# Patient Record
Sex: Male | Born: 1950 | Race: White | Hispanic: No | Marital: Married | Smoking: Current every day smoker
Health system: Southern US, Community
[De-identification: ages and names within clinical notes are randomized; demographics above are authoritative.]

## PROBLEM LIST (undated history)

## (undated) DIAGNOSIS — E785 Hyperlipidemia, unspecified: Secondary | ICD-10-CM

## (undated) DIAGNOSIS — I700 Atherosclerosis of aorta: Secondary | ICD-10-CM

## (undated) DIAGNOSIS — E559 Vitamin D deficiency, unspecified: Secondary | ICD-10-CM

## (undated) DIAGNOSIS — I4719 Other supraventricular tachycardia: Secondary | ICD-10-CM

## (undated) DIAGNOSIS — I2699 Other pulmonary embolism without acute cor pulmonale: Secondary | ICD-10-CM

## (undated) DIAGNOSIS — S2220XA Unspecified fracture of sternum, initial encounter for closed fracture: Secondary | ICD-10-CM

## (undated) DIAGNOSIS — R972 Elevated prostate specific antigen [PSA]: Secondary | ICD-10-CM

## (undated) DIAGNOSIS — I471 Supraventricular tachycardia: Secondary | ICD-10-CM

## (undated) DIAGNOSIS — R7982 Elevated C-reactive protein (CRP): Secondary | ICD-10-CM

## (undated) DIAGNOSIS — Z720 Tobacco use: Secondary | ICD-10-CM

## (undated) DIAGNOSIS — L409 Psoriasis, unspecified: Secondary | ICD-10-CM

## (undated) DIAGNOSIS — I2510 Atherosclerotic heart disease of native coronary artery without angina pectoris: Secondary | ICD-10-CM

## (undated) DIAGNOSIS — C9110 Chronic lymphocytic leukemia of B-cell type not having achieved remission: Secondary | ICD-10-CM

## (undated) DIAGNOSIS — B009 Herpesviral infection, unspecified: Secondary | ICD-10-CM

## (undated) DIAGNOSIS — T7840XA Allergy, unspecified, initial encounter: Secondary | ICD-10-CM

## (undated) DIAGNOSIS — Z8781 Personal history of (healed) traumatic fracture: Secondary | ICD-10-CM

## (undated) DIAGNOSIS — L719 Rosacea, unspecified: Secondary | ICD-10-CM

## (undated) DIAGNOSIS — R768 Other specified abnormal immunological findings in serum: Secondary | ICD-10-CM

## (undated) HISTORY — DX: Personal history of (healed) traumatic fracture: Z87.81

## (undated) HISTORY — DX: Unspecified fracture of sternum, initial encounter for closed fracture: S22.20XA

## (undated) HISTORY — DX: Elevated C-reactive protein (CRP): R79.82

## (undated) HISTORY — DX: Other specified abnormal immunological findings in serum: R76.8

## (undated) HISTORY — DX: Vitamin D deficiency, unspecified: E55.9

## (undated) HISTORY — DX: Tobacco use: Z72.0

## (undated) HISTORY — PX: FRACTURE SURGERY: SHX138

## (undated) HISTORY — DX: Psoriasis, unspecified: L40.9

## (undated) HISTORY — DX: Allergy, unspecified, initial encounter: T78.40XA

## (undated) HISTORY — DX: Hyperlipidemia, unspecified: E78.5

## (undated) HISTORY — DX: Elevated prostate specific antigen (PSA): R97.20

## (undated) HISTORY — DX: Atherosclerosis of aorta: I70.0

## (undated) HISTORY — DX: Rosacea, unspecified: L71.9

## (undated) HISTORY — DX: Herpesviral infection, unspecified: B00.9

## (undated) HISTORY — DX: Atherosclerotic heart disease of native coronary artery without angina pectoris: I25.10

## (undated) SURGERY — PULMONARY VENOGRAPHY
Anesthesia: Moderate Sedation | Laterality: Right

---

## 1972-11-27 HISTORY — PX: APPENDECTOMY: SHX54

## 1997-11-27 DIAGNOSIS — S2220XA Unspecified fracture of sternum, initial encounter for closed fracture: Secondary | ICD-10-CM

## 1997-11-27 DIAGNOSIS — Z8781 Personal history of (healed) traumatic fracture: Secondary | ICD-10-CM

## 1997-11-27 HISTORY — DX: Personal history of (healed) traumatic fracture: Z87.81

## 1997-11-27 HISTORY — DX: Unspecified fracture of sternum, initial encounter for closed fracture: S22.20XA

## 2002-11-27 HISTORY — PX: TIBIA FRACTURE SURGERY: SHX806

## 2014-02-25 DIAGNOSIS — R768 Other specified abnormal immunological findings in serum: Secondary | ICD-10-CM

## 2014-02-25 HISTORY — DX: Other specified abnormal immunological findings in serum: R76.8

## 2015-08-06 DIAGNOSIS — E785 Hyperlipidemia, unspecified: Secondary | ICD-10-CM | POA: Insufficient documentation

## 2015-08-06 DIAGNOSIS — L719 Rosacea, unspecified: Secondary | ICD-10-CM | POA: Insufficient documentation

## 2015-08-06 DIAGNOSIS — Z720 Tobacco use: Secondary | ICD-10-CM | POA: Insufficient documentation

## 2015-08-06 DIAGNOSIS — R7982 Elevated C-reactive protein (CRP): Secondary | ICD-10-CM | POA: Insufficient documentation

## 2015-08-06 DIAGNOSIS — L409 Psoriasis, unspecified: Secondary | ICD-10-CM | POA: Insufficient documentation

## 2015-08-06 DIAGNOSIS — E559 Vitamin D deficiency, unspecified: Secondary | ICD-10-CM | POA: Insufficient documentation

## 2015-08-06 DIAGNOSIS — B009 Herpesviral infection, unspecified: Secondary | ICD-10-CM | POA: Insufficient documentation

## 2015-08-06 DIAGNOSIS — R972 Elevated prostate specific antigen [PSA]: Secondary | ICD-10-CM | POA: Insufficient documentation

## 2015-08-16 ENCOUNTER — Ambulatory Visit: Payer: Self-pay | Admitting: Family Medicine

## 2015-08-17 ENCOUNTER — Encounter: Payer: Self-pay | Admitting: Family Medicine

## 2015-08-17 ENCOUNTER — Ambulatory Visit (INDEPENDENT_AMBULATORY_CARE_PROVIDER_SITE_OTHER): Payer: BC Managed Care – PPO | Admitting: Family Medicine

## 2015-08-17 VITALS — BP 111/72 | HR 72 | Temp 97.5°F | Ht 65.0 in | Wt 157.0 lb

## 2015-08-17 DIAGNOSIS — B009 Herpesviral infection, unspecified: Secondary | ICD-10-CM

## 2015-08-17 DIAGNOSIS — M25512 Pain in left shoulder: Secondary | ICD-10-CM | POA: Diagnosis not present

## 2015-08-17 DIAGNOSIS — E785 Hyperlipidemia, unspecified: Secondary | ICD-10-CM

## 2015-08-17 DIAGNOSIS — R7982 Elevated C-reactive protein (CRP): Secondary | ICD-10-CM

## 2015-08-17 DIAGNOSIS — L719 Rosacea, unspecified: Secondary | ICD-10-CM

## 2015-08-17 DIAGNOSIS — D72820 Lymphocytosis (symptomatic): Secondary | ICD-10-CM | POA: Diagnosis not present

## 2015-08-17 DIAGNOSIS — R972 Elevated prostate specific antigen [PSA]: Secondary | ICD-10-CM | POA: Diagnosis not present

## 2015-08-17 DIAGNOSIS — E559 Vitamin D deficiency, unspecified: Secondary | ICD-10-CM

## 2015-08-17 MED ORDER — VALACYCLOVIR HCL 500 MG PO TABS: 500.0000 mg | ORAL_TABLET | Freq: Two times a day (BID) | ORAL | Status: DC

## 2015-08-17 MED ORDER — DOXYCYCLINE HYCLATE 100 MG PO CAPS: 100.0000 mg | ORAL_CAPSULE | Freq: Two times a day (BID) | ORAL | Status: DC | PRN

## 2015-08-17 NOTE — Assessment & Plan Note (Addendum)
Takes doxy during flares; refills provided

## 2015-08-17 NOTE — Assessment & Plan Note (Signed)
Check level today

## 2015-08-17 NOTE — Patient Instructions (Addendum)
We'll contact you about the lab results If all looks good, return to see Amy for a shingles vaccine We'll refer you to an orthopaedist about your left shoulder Try to limit eggs, cheese, saturated fats Consider having NMR lipid profile done to look at particles, not just the number; you are welcome to read about it and call me for an order if desired (you would not need an appointment with me for that) Please do consider giving up your cigarettes; if you would like help with that, please let me know

## 2015-08-17 NOTE — Progress Notes (Signed)
BP 111/72 mmHg  Pulse 72  Temp(Src) 97.5 F (36.4 C)  Ht 5' 5"  (1.651 m)  Wt 157 lb (71.215 kg)  BMI 26.13 kg/m2  SpO2 98%   Subjective:    Patient ID: Andres Mullins, male    DOB: 1951-03-29, 64 y.o.   MRN: [id number]  HPI: Andres Mullins is a 64 y.o. male  Chief Complaint  Patient presents with  . Shoulder Pain    left shoulder for the last few months, no injury  . OTHER    he thinks he is due for some follow up labs  . OTHER    he'd like to discuss the shingles vaccine   He brought in labs from March; lymph 4.1; he was supposed to have had them rechecked but did not get around to it, so we'll recheck that today; no fevers, no night sweats No recent viral herpes flares, just twice a year; had not had a flare when this was drawn  Low vitamin D noted in March; no supplementation; does get sun exposure; had been 16.25 February 2014; 24.3 in March of 2016  He saw the rheumatologist for the RA factor elevated; he said it was just high, but patient not sick from it, nothing to worry about  Lately left shoulder hurts when he raises the left arm to put on deodorant; reaching back or forward; no weakness, no tingling, no numbness; would like to see orthopaedist; needs referral  Question about shingles vaccine; had chicken pox as a child; wonders about getting the shot  Relevant past medical, surgical, family and social history reviewed and updated as indicated. Interim medical history since our last visit reviewed. Allergies and medications reviewed and updated.  Review of Systems Per HPI unless specifically indicated above     Objective:    BP 111/72 mmHg  Pulse 72  Temp(Src) 97.5 F (36.4 C)  Ht 5' 5"  (1.651 m)  Wt 157 lb (71.215 kg)  BMI 26.13 kg/m2  SpO2 98%  Wt Readings from Last 3 Encounters:  08/17/15 157 lb (71.215 kg)  02/10/15 163 lb (73.936 kg)    Physical Exam  Constitutional: He appears well-developed and well-nourished.  HENT:  Head: Normocephalic and  atraumatic.  Eyes: EOM are normal. No scleral icterus.  Cardiovascular: Normal rate.   Pulmonary/Chest: Effort normal.  Psychiatric: He has a normal mood and affect. His behavior is normal. Judgment and thought content normal.      Assessment & Plan:   Problem List Items Addressed This Visit      Musculoskeletal and Integument   Rosacea    Takes doxy during flares; refills provided        Other   Herpes    Refills provided of valacyclovir for intermittent flares      Relevant Medications   valACYclovir (VALTREX) 500 MG tablet   Hyperlipidemia    Patient politely declined checking lipids today; he is not sold on the benefits of treatment with statins; he may re-consider and I'll be glad to order fasting lipid panel if he changes his mind, perhaps have NMR checked; decrease saturated fats      Vitamin D deficiency disease    Check level today      Relevant Orders   Vit D  25 hydroxy (rtn osteoporosis monitoring) (Completed)   Elevated PSA    Checked out by urologist; pt did not want this rechecked today      Elevated C-reactive protein (CRP)    Evaluated by  rheum      Lymphocytosis - Primary    Recheck level today      Relevant Orders   CBC with Differential/Platelet (Completed)    Other Visit Diagnoses    Shoulder pain, left        refer to orthopaedist as requested    Relevant Orders    Ambulatory referral to Orthopedic Surgery        Follow up plan: Return if symptoms worsen or fail to improve.  Orders Placed This Encounter  Procedures  . CBC with Differential/Platelet  . Vit D  25 hydroxy (rtn osteoporosis monitoring)  . Ambulatory referral to Orthopedic Surgery    Meds ordered this encounter  Medications  . doxycycline (VIBRAMYCIN) 100 MG capsule    Sig: Take 1 capsule (100 mg total) by mouth 2 (two) times daily as needed (as needed for rosacea flares).    Dispense:  20 capsule    Refill:  5  . valACYclovir (VALTREX) 500 MG tablet    Sig:  Take 1 tablet (500 mg total) by mouth 2 (two) times daily. One by mouth twice a day for 3 days; start as soon as possible with first symptoms.    Dispense:  6 tablet    Refill:  5   An after-visit summary was printed and given to the patient at [facility].  Please see the patient instructions which may contain other information and recommendations beyond what is mentioned above in the assessment and plan.

## 2015-08-17 NOTE — Assessment & Plan Note (Addendum)
Checked out by urologist; pt did not want this rechecked today

## 2015-08-17 NOTE — Assessment & Plan Note (Addendum)
Patient politely declined checking lipids today; he is not sold on the benefits of treatment with statins; he may re-consider and I'll be glad to order fasting lipid panel if he changes his mind, perhaps have NMR checked; decrease saturated fats

## 2015-08-18 LAB — CBC WITH DIFFERENTIAL/PLATELET
BASOS ABS: 0 10*3/uL (ref 0.0–0.2)
Basos: 0 %
EOS (ABSOLUTE): 0.2 10*3/uL (ref 0.0–0.4)
Eos: 2 %
Hematocrit: 43.1 % (ref 37.5–51.0)
Hemoglobin: 14.5 g/dL (ref 12.6–17.7)
IMMATURE GRANULOCYTES: 0 %
Immature Grans (Abs): 0 10*3/uL (ref 0.0–0.1)
LYMPHS ABS: 4.6 10*3/uL — AB (ref 0.7–3.1)
Lymphs: 50 %
MCH: 29.8 pg (ref 26.6–33.0)
MCHC: 33.6 g/dL (ref 31.5–35.7)
MCV: 89 fL (ref 79–97)
MONOS ABS: 0.5 10*3/uL (ref 0.1–0.9)
Monocytes: 6 %
NEUTROS PCT: 42 %
Neutrophils Absolute: 4 10*3/uL (ref 1.4–7.0)
PLATELETS: 240 10*3/uL (ref 150–379)
RBC: 4.87 x10E6/uL (ref 4.14–5.80)
RDW: 14.4 % (ref 12.3–15.4)
WBC: 9.3 10*3/uL (ref 3.4–10.8)

## 2015-08-18 LAB — VITAMIN D 25 HYDROXY (VIT D DEFICIENCY, FRACTURES): VIT D 25 HYDROXY: 29.4 ng/mL — AB (ref 30.0–100.0)

## 2015-08-22 NOTE — Assessment & Plan Note (Signed)
Refills provided of valacyclovir for intermittent flares

## 2015-08-22 NOTE — Assessment & Plan Note (Signed)
Recheck level today

## 2015-08-22 NOTE — Assessment & Plan Note (Signed)
Evaluated by rheum

## 2015-08-24 ENCOUNTER — Encounter: Payer: Self-pay | Admitting: Family Medicine

## 2015-08-24 DIAGNOSIS — D72820 Lymphocytosis (symptomatic): Secondary | ICD-10-CM

## 2015-08-24 NOTE — Progress Notes (Signed)
Sending letter to patient about elevated lymphocyte count Will have staff make referral to heme a few weeks out to avoid them calling him before he gets my letter

## 2015-08-24 NOTE — Assessment & Plan Note (Signed)
Lymphocyte count 4.1k in March 2016; now 4.6k in Sept 2016; refer to hematologist

## 2015-09-07 ENCOUNTER — Encounter: Payer: Self-pay | Admitting: Internal Medicine

## 2015-09-07 ENCOUNTER — Inpatient Hospital Stay: Payer: BC Managed Care – PPO | Attending: Internal Medicine | Admitting: Internal Medicine

## 2015-09-07 ENCOUNTER — Inpatient Hospital Stay: Payer: BC Managed Care – PPO

## 2015-09-07 VITALS — BP 124/82 | HR 80 | Temp 96.7°F | Ht 65.75 in | Wt 158.3 lb

## 2015-09-07 DIAGNOSIS — L409 Psoriasis, unspecified: Secondary | ICD-10-CM | POA: Insufficient documentation

## 2015-09-07 DIAGNOSIS — Z79899 Other long term (current) drug therapy: Secondary | ICD-10-CM | POA: Diagnosis not present

## 2015-09-07 DIAGNOSIS — E559 Vitamin D deficiency, unspecified: Secondary | ICD-10-CM | POA: Diagnosis not present

## 2015-09-07 DIAGNOSIS — R972 Elevated prostate specific antigen [PSA]: Secondary | ICD-10-CM | POA: Diagnosis not present

## 2015-09-07 DIAGNOSIS — R7982 Elevated C-reactive protein (CRP): Secondary | ICD-10-CM | POA: Diagnosis not present

## 2015-09-07 DIAGNOSIS — E785 Hyperlipidemia, unspecified: Secondary | ICD-10-CM

## 2015-09-07 DIAGNOSIS — F17210 Nicotine dependence, cigarettes, uncomplicated: Secondary | ICD-10-CM | POA: Diagnosis not present

## 2015-09-07 DIAGNOSIS — B009 Herpesviral infection, unspecified: Secondary | ICD-10-CM | POA: Diagnosis not present

## 2015-09-07 DIAGNOSIS — Z8781 Personal history of (healed) traumatic fracture: Secondary | ICD-10-CM | POA: Insufficient documentation

## 2015-09-07 DIAGNOSIS — D72820 Lymphocytosis (symptomatic): Secondary | ICD-10-CM | POA: Insufficient documentation

## 2015-09-07 LAB — CBC WITH DIFFERENTIAL/PLATELET
Basophils Absolute: 0.1 10*3/uL (ref 0–0.1)
Basophils Relative: 1 %
EOS ABS: 0.1 10*3/uL (ref 0–0.7)
EOS PCT: 1 %
HCT: 45.9 % (ref 40.0–52.0)
Hemoglobin: 15.7 g/dL (ref 13.0–18.0)
LYMPHS ABS: 4.9 10*3/uL — AB (ref 1.0–3.6)
Lymphocytes Relative: 36 %
MCH: 30.4 pg (ref 26.0–34.0)
MCHC: 34.2 g/dL (ref 32.0–36.0)
MCV: 88.8 fL (ref 80.0–100.0)
MONOS PCT: 5 %
Monocytes Absolute: 0.7 10*3/uL (ref 0.2–1.0)
Neutro Abs: 7.6 10*3/uL — ABNORMAL HIGH (ref 1.4–6.5)
Neutrophils Relative %: 57 %
PLATELETS: 242 10*3/uL (ref 150–440)
RBC: 5.17 MIL/uL (ref 4.40–5.90)
RDW: 13.9 % (ref 11.5–14.5)
WBC: 13.4 10*3/uL — ABNORMAL HIGH (ref 3.8–10.6)

## 2015-09-07 NOTE — Progress Notes (Signed)
Patient feels well. Had lymphocytosis noted on last cbc at his primary care visit. No symptoms, no illness. In good health. Here to make sure he doesn't have "cancer". No family history of any cancer. Has a Hx of viral infection/herpes.

## 2015-09-07 NOTE — Progress Notes (Signed)
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 Patient states that he was being evaluated by PCP with the blood work prior to getting shingles vaccination. Patient was noted to have slight elevation of his lymphocyte count as noted above.  Patient denies any unusual night sweats or new lumps or bumps or low-grade fevers or frequent infections. He had about 5-7 pounds weight loss over the last many months. He denies any unusual shortness of breath chest pain cough.   ROS: A complete 10 point review of system is done which is negative except mentioned above in history of present illness  MEDICAL HISTORY:  Past Medical History  Diagnosis Date  . Herpes   . Rosacea   . Psoriasis   . Hyperlipidemia   . Vitamin D deficiency disease   . Rheumatoid factor positive April 2015  . Tobacco abuse   . Elevated PSA   . Elevated C-reactive protein (CRP)   . History of rib fracture 1999    multiple  . Fractured sternum 1999    SURGICAL HISTORY: Past Surgical History  Procedure Laterality Date  . Appendectomy  1974  . Tibia fracture surgery Left 2004    titanium rod, ORIF    SOCIAL HISTORY: Social History   Social History  . Marital Status: Married    Spouse Name: N/A  . Number of Children: N/A  . Years of Education: N/A   Occupational History  . Not on file.   Social History Main Topics  . Smoking status: Current Every Day Smoker -- 0.50 packs/day for 45 years    Types: Cigarettes  . Smokeless tobacco: Never Used  . Alcohol Use: 0.6 oz/week    1 Glasses of wine per week  . Drug Use: No  . Sexual Activity: Not on file   Other Topics  Concern  . Not on file   Social History Narrative    FAMILY HISTORY: Family History  Problem Relation Age of Onset  . Heart disease Father   . Osteoarthritis Father   . Alzheimer's disease Father   . Cancer Neg Hx   . Diabetes Neg Hx   . Hypertension Neg Hx   . Stroke Neg Hx   . COPD Neg Hx     ALLERGIES:  has No Known Allergies.  MEDICATIONS:  Current Outpatient Prescriptions  Medication Sig Dispense Refill  . cholecalciferol (VITAMIN D) 1000 UNITS tablet Take 1,000 Units by mouth daily.    Marland Kitchen doxycycline (VIBRAMYCIN) 100 MG capsule Take 1 capsule (100 mg total) by mouth 2 (two) times daily as needed (as needed for rosacea flares). 20 capsule 5  . valACYclovir (VALTREX) 500 MG tablet Take 1 tablet (500 mg total) by mouth 2 (two) times daily. One by mouth twice a day for 3 days; start as soon as possible with first symptoms. 6 tablet 5   No current facility-administered medications for this visit.      Marland Kitchen  PHYSICAL EXAMINATION: ECOG PERFORMANCE STATUS: 0 - Asymptomatic  Filed Vitals:   09/07/15 1508  BP: 124/82  Pulse: 80  Temp: 96.7 F (35.9 C)   Filed Weights   09/07/15 1508  Weight: 158 lb 4.6  oz (71.8 kg)    GENERAL: Well-nourished well-developed; Alert, no distress and comfortable.   Alone. EYES: no pallor or icterus OROPHARYNX: no thrush or ulceration; good dentition  NECK: supple, no masses felt LYMPH:  no palpable lymphadenopathy in the cervical, axillary or inguinal regions LUNGS: clear to auscultation and  No wheeze or crackles HEART/CVS: regular rate & rhythm and no murmurs; No lower extremity edema ABDOMEN: abdomen soft, non-tender and normal bowel sounds Musculoskeletal:no cyanosis of digits and no clubbing  PSYCH: alert & oriented x 3 with fluent speech NEURO: no focal motor/sensory deficits SKIN:  no rashes or significant lesions  LABORATORY DATA:  I have reviewed the data as listed Lab Results  Component Value Date   WBC 9.3 08/17/2015    HCT 43.1 08/17/2015   No results for input(s): NA, K, CL, CO2, GLUCOSE, BUN, CREATININE, CALCIUM, GFRNONAA, GFRAA, PROT, ALBUMIN, AST, ALT, ALKPHOS, BILITOT, BILIDIR, IBILI in the last 8760 hours.  RADIOGRAPHIC STUDIES: I have personally reviewed the radiological images as listed and agreed with the findings in the report. No results found.  ASSESSMENT & PLAN:  Lymphocytosis absolute 4600; with normal white count hemoglobin and platelets. Incidental finding; patient is asymptomatic. I would recommend repeating CBC; if positive for lymphocytosis I recommend- checking flow cytometry for any monoclonal B-cell population.  # I would recommend follow-up based upon the results of the above blood work. Patient was asked to give [place] a call in approximately week regarding the results of the blood work.  Thank you Dr. Sanda Klein for allowing me to participate in the care of your pleasant patient. Please do not hesitate to contact me with questions or concerns in the interim.  All questions were answered. The patient knows to call the clinic with any problems, questions or concerns.  I spent 30 minutes counseling the patient face to face. The total time spent in the appointment was 40 minutes and more than 50% was on counseling.     Cammie Sickle, MD 09/07/2015 3:25 PM

## 2015-09-09 LAB — COMP PANEL: LEUKEMIA/LYMPHOMA

## 2015-09-13 ENCOUNTER — Inpatient Hospital Stay (HOSPITAL_BASED_OUTPATIENT_CLINIC_OR_DEPARTMENT_OTHER): Payer: BC Managed Care – PPO | Admitting: Internal Medicine

## 2015-09-13 ENCOUNTER — Encounter: Payer: Self-pay | Admitting: Internal Medicine

## 2015-09-13 ENCOUNTER — Telehealth: Payer: Self-pay | Admitting: *Deleted

## 2015-09-13 VITALS — BP 124/82 | HR 98 | Temp 98.0°F | Resp 18 | Ht 65.75 in | Wt 155.9 lb

## 2015-09-13 DIAGNOSIS — R7982 Elevated C-reactive protein (CRP): Secondary | ICD-10-CM | POA: Diagnosis not present

## 2015-09-13 DIAGNOSIS — B009 Herpesviral infection, unspecified: Secondary | ICD-10-CM

## 2015-09-13 DIAGNOSIS — Z79899 Other long term (current) drug therapy: Secondary | ICD-10-CM | POA: Diagnosis not present

## 2015-09-13 DIAGNOSIS — R972 Elevated prostate specific antigen [PSA]: Secondary | ICD-10-CM | POA: Diagnosis not present

## 2015-09-13 DIAGNOSIS — Z8781 Personal history of (healed) traumatic fracture: Secondary | ICD-10-CM

## 2015-09-13 DIAGNOSIS — E785 Hyperlipidemia, unspecified: Secondary | ICD-10-CM

## 2015-09-13 DIAGNOSIS — F17210 Nicotine dependence, cigarettes, uncomplicated: Secondary | ICD-10-CM

## 2015-09-13 DIAGNOSIS — E559 Vitamin D deficiency, unspecified: Secondary | ICD-10-CM

## 2015-09-13 DIAGNOSIS — D72820 Lymphocytosis (symptomatic): Secondary | ICD-10-CM | POA: Diagnosis not present

## 2015-09-13 DIAGNOSIS — L409 Psoriasis, unspecified: Secondary | ICD-10-CM

## 2015-09-13 NOTE — Telephone Encounter (Signed)
Patient notified that MD. This pt needs to follow up with [place] to discuss the results of blood test in next few days.  The patient states that he can come today for the office visit. Appointment arranged for 2 pm today.

## 2015-09-13 NOTE — Progress Notes (Signed)
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  Patient continues to feel good otherwise; denies any new symptoms.  REVIEW OF SYSTEMS:  A complete 10 point review of system is done which is negative except mentioned above/history of present illness.   PAST MEDICAL HISTORY :  Past Medical History  Diagnosis Date  . Herpes   . Rosacea   . Psoriasis   . Hyperlipidemia   . Vitamin D deficiency disease   . Rheumatoid factor positive April 2015  . Tobacco abuse   . Elevated PSA   . Elevated C-reactive protein (CRP)   . History of rib fracture 1999    multiple  . Fractured sternum 1999    PAST SURGICAL HISTORY :   Past Surgical History  Procedure Laterality Date  . Appendectomy  1974  . Tibia fracture surgery Left 2004    titanium rod, ORIF    FAMILY HISTORY :   Family History  Problem Relation Age of Onset  . Heart disease Father   . Osteoarthritis Father   . Alzheimer's disease Father   . Cancer Neg Hx   . Diabetes Neg Hx   . Hypertension Neg Hx   . Stroke Neg Hx   . COPD Neg Hx     SOCIAL HISTORY:   Social History  Substance Use Topics  . Smoking status: Current Every Day Smoker -- 0.50 packs/day for 45 years    Types: Cigarettes  . Smokeless tobacco: Never Used  . Alcohol Use: 0.6 oz/week    1 Glasses of wine per week    ALLERGIES:  has No Known Allergies.  MEDICATIONS:  Current Outpatient Prescriptions  Medication Sig Dispense Refill  . cholecalciferol (VITAMIN D) 1000 UNITS tablet Take 1,000 Units by mouth daily.    . valACYclovir (VALTREX) 500 MG tablet Take 1 tablet (500 mg total) by  mouth 2 (two) times daily. One by mouth twice a day for 3 days; start as soon as possible with first symptoms. 6 tablet 5  . doxycycline (VIBRAMYCIN) 100 MG capsule Take 1 capsule (100 mg total) by mouth 2 (two) times daily as needed (as needed for rosacea flares). (Patient not taking: Reported on 09/13/2015) 20 capsule 5   No current facility-administered medications for this visit.    PHYSICAL EXAMINATION: ECOG PERFORMANCE STATUS: 0 - Asymptomatic  BP 124/82 mmHg  Pulse 98  Temp(Src) 98 F (36.7 C) (Tympanic)  Resp 18  Ht 5' 5.75" (1.67 m)  Wt 155 lb 13.8 oz (70.7 kg)  BMI 25.35 kg/m2  Filed Weights   09/13/15 1414  Weight: 155 lb 13.8 oz (70.7 kg)    GENERAL: Well-nourished well-developed; Alert, no distress and comfortable. Alone. LABORATORY DATA:  I have reviewed the data as listed No results found for: NA, K, CL, CO2, GLUCOSE, BUN, CREATININE, CALCIUM, PROT, ALBUMIN, AST, ALT, ALKPHOS, BILITOT, GFRNONAA, GFRAA  No results found for: SPEP, UPEP  Lab Results  Component Value Date   WBC 13.4* 09/07/2015   NEUTROABS 7.6* 09/07/2015   HGB 15.7 09/07/2015   HCT 45.9 09/07/2015   MCV 88.8 09/07/2015   PLT 242 09/07/2015  Chemistry   No results found for: NA, K, CL, CO2, BUN, CREATININE, GLU No results found for: CALCIUM, ALKPHOS, AST, ALT, BILITOT     RADIOGRAPHIC STUDIES: I have personally reviewed the radiological images as listed and agreed with the findings in the report. No results found.   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 Patient is asymptomatic. There is a possibility that monoclonal B-cell lymphocytosis could transform into CLL over time. Recommend follow-up in 6 months with CBC. If the patient gets symptomatic with weight loss; lymphadenopathy or night sweats. He was asked to inform us ASAP.   # Okay to get Shingles vaccination  # follow up in 6 months with labs.   # Again reminded to quit smoking.  No orders of  the defined types were placed in this encounter.   All questions were answered. The patient knows to call the clinic with any problems, questions or concerns. No barriers to learning was detected. I spent 15 minutes counseling the patient face to face. The total time spent in the appointment was 30 minutes and more than 50% was on counseling and review of test results     Cammie Sickle, MD 09/13/2015 2:54 PM

## 2015-10-14 ENCOUNTER — Ambulatory Visit (INDEPENDENT_AMBULATORY_CARE_PROVIDER_SITE_OTHER): Payer: BC Managed Care – PPO | Admitting: Family Medicine

## 2015-10-14 ENCOUNTER — Encounter: Payer: Self-pay | Admitting: Family Medicine

## 2015-10-14 VITALS — BP 102/69 | HR 87 | Temp 98.1°F | Ht 64.5 in | Wt 155.0 lb

## 2015-10-14 DIAGNOSIS — M7662 Achilles tendinitis, left leg: Secondary | ICD-10-CM | POA: Diagnosis not present

## 2015-10-14 DIAGNOSIS — D170 Benign lipomatous neoplasm of skin and subcutaneous tissue of head, face and neck: Secondary | ICD-10-CM

## 2015-10-14 DIAGNOSIS — B070 Plantar wart: Secondary | ICD-10-CM

## 2015-10-14 DIAGNOSIS — Z23 Encounter for immunization: Secondary | ICD-10-CM | POA: Diagnosis not present

## 2015-10-14 DIAGNOSIS — D72820 Lymphocytosis (symptomatic): Secondary | ICD-10-CM
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
## 2015-10-14 NOTE — Patient Instructions (Signed)
We'll contact you about the appointments If you have not heard anything from my staff in a week about any orders/referrals/studies from today, please contact us here to follow-up [phone]

## 2015-10-15 ENCOUNTER — Encounter: Payer: Self-pay | Admitting: *Deleted

## 2015-10-18 ENCOUNTER — Encounter: Payer: BC Managed Care – PPO | Admitting: Podiatry

## 2015-10-18 ENCOUNTER — Ambulatory Visit: Payer: Self-pay

## 2015-10-18 DIAGNOSIS — M779 Enthesopathy, unspecified: Secondary | ICD-10-CM

## 2015-10-19 DIAGNOSIS — D170 Benign lipomatous neoplasm of skin and subcutaneous tissue of head, face and neck: Secondary | ICD-10-CM | POA: Insufficient documentation

## 2015-10-19 NOTE — Assessment & Plan Note (Signed)
Lesion on the left temple very suggestive of a lipoma (benign); will refer to plastics for evaluation, surgical removal; patient agrees

## 2015-10-19 NOTE — Assessment & Plan Note (Addendum)
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

## 2015-10-19 NOTE — Assessment & Plan Note (Signed)
Will refer to podiatrist for evaluation and treatment

## 2015-10-27 NOTE — Progress Notes (Signed)
Encounter created and timed out before order level scanning completed.

## 2015-10-28 ENCOUNTER — Encounter: Payer: Self-pay | Admitting: General Surgery

## 2015-10-28 ENCOUNTER — Ambulatory Visit (INDEPENDENT_AMBULATORY_CARE_PROVIDER_SITE_OTHER): Payer: BC Managed Care – PPO | Admitting: General Surgery

## 2015-10-28 VITALS — BP 124/68 | HR 78 | Resp 12 | Ht 65.0 in | Wt 157.0 lb

## 2015-10-28 DIAGNOSIS — M799 Soft tissue disorder, unspecified: Secondary | ICD-10-CM | POA: Diagnosis not present

## 2015-10-28 DIAGNOSIS — M7989 Other specified soft tissue disorders: Secondary | ICD-10-CM

## 2015-10-28 DIAGNOSIS — R220 Localized swelling, mass and lump, head: Secondary | ICD-10-CM

## 2015-10-28 NOTE — Progress Notes (Addendum)
Patient ID: Andres Mullins, male   DOB: 01-Aug-1951, 64 y.o.   MRN: [id number]  Chief Complaint  Patient presents with  . Other    Lipoma on head    HPI Andres Mullins is a 64 y.o. male here today for an evaluation of a lipoma on head. Patient states he noticed this about 20 years ago. Denies pain, no drainage. He states it is the same size but he is noticing it more when wearing a hat. Would like to have it removed.  I have reviewed the history of present illness with the patient.  HPI  Past Medical History  Diagnosis Date  . Herpes   . Rosacea   . Psoriasis   . Hyperlipidemia   . Vitamin D deficiency disease   . Rheumatoid factor positive April 2015  . Tobacco abuse   . Elevated PSA   . Elevated C-reactive protein (CRP)   . History of rib fracture 1999    multiple  . Fractured sternum 1999    Past Surgical History  Procedure Laterality Date  . Appendectomy  1974  . Tibia fracture surgery Left 2004    titanium rod, ORIF    Family History  Problem Relation Age of Onset  . Heart disease Father   . Osteoarthritis Father   . Alzheimer's disease Father   . Cancer Neg Hx   . Diabetes Neg Hx   . Hypertension Neg Hx   . Stroke Neg Hx   . COPD Neg Hx     Social History Social History  Substance Use Topics  . Smoking status: Current Every Day Smoker -- 0.50 packs/day for 45 years    Types: Cigarettes  . Smokeless tobacco: Never Used  . Alcohol Use: 1.2 oz/week    2 Glasses of wine per week     Comment: a couple glasses of wine a week    No Known Allergies  Current Outpatient Prescriptions  Medication Sig Dispense Refill  . cholecalciferol (VITAMIN D) 1000 UNITS tablet Take 1,000 Units by mouth daily.    Marland Kitchen doxycycline (VIBRAMYCIN) 100 MG capsule Take 1 capsule (100 mg total) by mouth 2 (two) times daily as needed (as needed for rosacea flares). 20 capsule 5  . valACYclovir (VALTREX) 500 MG tablet Take 1 tablet (500 mg total) by mouth 2 (two) times daily. One by  mouth twice a day for 3 days; start as soon as possible with first symptoms. 6 tablet 5   No current facility-administered medications for this visit.    Review of Systems Review of Systems  Constitutional: Negative.   Respiratory: Negative.   Cardiovascular: Negative.     Blood pressure 124/68, pulse 78, resp. rate 12, height 5' 5"  (1.651 m), weight 157 lb (71.215 kg).  Physical Exam Physical Exam  Constitutional: He is oriented to person, place, and time. He appears well-developed and well-nourished.  HENT:  Mouth/Throat: Oropharynx is clear and moist.  Eyes: Conjunctivae are normal. No scleral icterus.  Neck: Neck supple.  Lymphadenopathy:    He has no cervical adenopathy.  Neurological: He is alert and oriented to person, place, and time.  Skin: Skin is warm and dry.  2.5 cm soft, mobile mass over left temple  Psychiatric: His behavior is normal.    Data Reviewed Progress notes reviewed  Assessment    Subcutaneous mass, left facial area. Likely cyst or lipoma. Has not grown over past 20 years since first noticed and is not painful, but patient would like to  have it removed which is reasonable.      Plan    Excision discussed in full with patient. Patient agreeable to plan.  Procedure completed today.  Procedure: Excision lipoma, left temple  Anesthesia: 5 mL of 0.5% marcaine mixed with 1% xylocaine, 2 mL 1% xylocaine with epinephrine.   Prep: ChloraPrep  Description: Area was prepped and draped out as sterile field. A 2 cm incision was made along the skin crease overlying the mass. The mass was a well circumscribed lipoma in the deep subcutaneous plane. It was freed and removed in full. The skin was closed with interrupted 5-0 Prolene and the area was dressed with neosporin ointment. The procedure was well tolerated and no immediate complications were noted. Patient was instructed regarding wound care and asked to return in 1 week for suture removal.      PCP:   Karle Plumber G 11/09/2015, 9:27 AM

## 2015-10-28 NOTE — Patient Instructions (Addendum)
The patient is aware to call back for any questions or concerns. Keep area clean Return for suture removal Ice pack as needed for comfort

## 2015-11-02 ENCOUNTER — Telehealth: Payer: Self-pay | Admitting: *Deleted

## 2015-11-02 NOTE — Telephone Encounter (Signed)
-----   Message from Christene Lye, MD sent at 11/02/2015  8:11 AM EST ----- Please let pt pt know the pathology was normal.

## 2015-11-02 NOTE — Telephone Encounter (Signed)
Notified patient as instructed, patient pleased. Discussed follow-up appointments, patient agrees  

## 2015-11-04 ENCOUNTER — Ambulatory Visit (INDEPENDENT_AMBULATORY_CARE_PROVIDER_SITE_OTHER): Payer: BC Managed Care – PPO | Admitting: *Deleted

## 2015-11-04 DIAGNOSIS — M799 Soft tissue disorder, unspecified: Secondary | ICD-10-CM

## 2015-11-04 DIAGNOSIS — M7989 Other specified soft tissue disorders: Secondary | ICD-10-CM

## 2015-11-04 NOTE — Progress Notes (Signed)
Notified patient as instructed, patient pleased. Aware of pathology. Discussed follow-up appointments, patient agrees

## 2015-11-04 NOTE — Patient Instructions (Signed)
The patient is aware to call back for any questions or concerns.  

## 2015-11-11 ENCOUNTER — Ambulatory Visit: Payer: BC Managed Care – PPO | Admitting: Podiatry

## 2015-11-11 ENCOUNTER — Encounter: Payer: Self-pay | Admitting: Podiatry

## 2015-11-11 ENCOUNTER — Ambulatory Visit (INDEPENDENT_AMBULATORY_CARE_PROVIDER_SITE_OTHER): Payer: BC Managed Care – PPO | Admitting: Podiatry

## 2015-11-11 VITALS — BP 163/74 | HR 81 | Resp 18

## 2015-11-11 DIAGNOSIS — M779 Enthesopathy, unspecified: Secondary | ICD-10-CM | POA: Diagnosis not present

## 2015-11-11 DIAGNOSIS — Q828 Other specified congenital malformations of skin: Secondary | ICD-10-CM | POA: Diagnosis not present

## 2015-11-11 DIAGNOSIS — M79673 Pain in unspecified foot: Secondary | ICD-10-CM

## 2015-11-11 NOTE — Progress Notes (Signed)
   Subjective:    Patient ID: Andres Mullins, male    DOB: November 08, 1951, 64 y.o.   MRN: [id number]  HPI  64 year old male presents the office today with concerns of a possible wart or callus in the bottom of his left foot. He states that he has noticed over the last 2 weeks he tries to walk he feels that the area has almost completely resolved. He has had no treatment but it is not bothering him as much as it did previously. Denies any redness or drainage or any swelling. He also has a history of significant tibia fibular fracture on the left side for which she has an IM rod placed. He states he occasionally gets some tendinitis along the Achilles tendon over the last year. He states that he only has some discomfort after he hikes for some time or after excessive activity but he does not have pain on daily basis with regular activity. No swelling or redness of the area. No recent injury or trauma. No other complaints.   Review of Systems  All other systems reviewed and are negative.      Objective:   Physical Exam General: AAO x3, NAD  Dermatological: Skin is warm, dry and supple bilateral. Nails x 10 are well manicured; remaining integument appears unremarkable at this time. Small annular punctate hyperkeratotic lesion submetatarsal left foot. Upon debridement there was no pinpoint bleeding or evidence of verruca. There is no underlying ulceration, drainage or other signs of infection.  Vascular: Dorsalis Pedis artery and Posterior Tibial artery pedal pulses are 2/4 bilateral with immedate capillary fill time. Pedal hair growth present. No varicosities and no lower extremity edema present bilateral. There is no pain with calf compression, swelling, warmth, erythema.   Neruologic: Grossly intact via light touch bilateral. Vibratory intact via tuning fork bilateral. Protective threshold with Semmes Wienstein monofilament intact to all pedal sites bilateral. Patellar and Achilles deep tendon  reflexes 2+ bilateral. No Babinski or clonus noted bilateral.   Musculoskeletal: At this time there is no pain on core/insertion of the Achilles tendon and no defect is noted and Thompson test is negative. There is no overlying edema, erythema, increased warmth. There is no area pinpoint bony tenderness to bilateral lower extremities. There is no tenderness overlying the hyperkeratotic lesion left foot. No gross boney pedal deformities bilateral. No pain, crepitus, or limitation noted with foot and ankle range of motion bilateral. Muscular strength 5/5 in all groups tested bilateral.  Gait: Unassisted, Nonantalgic.      Assessment & Plan:  64 year old male with left porokeratosis, Achilles tendinitis -Treatment options discussed including all alternatives, risks, and complications -Etiology of symptoms were discussed -At this time there is no pain of the Achilles tendon. I discussed with him shoe gear modifications orthotics. He purchased power steps. Stretching exercises. Anti-inflammatories as needed. At this time there is no pain. Hold off on x-rays. He did bring old x-rays the office which I reviewed. -Hyperkeratotic lesion debrided 1 without, complications/bleeding. Discussed possible recurrence. -Follow-up as needed. Call with questions or concerns.  Celesta Gentile, DPM

## 2015-11-11 NOTE — Patient Instructions (Signed)

## 2015-11-14 ENCOUNTER — Encounter: Payer: Self-pay | Admitting: Podiatry

## 2015-12-01 ENCOUNTER — Telehealth: Payer: Self-pay | Admitting: Internal Medicine

## 2015-12-01 NOTE — Telephone Encounter (Signed)
Patient came into [facility] office and stated that Dr. B wants him to schedule a lab visit. We set it up for February 13 at his request. He also asked if we could take off his April appointment. Please advise.

## 2015-12-07 NOTE — Telephone Encounter (Signed)
Okay with me 

## 2016-01-10 ENCOUNTER — Encounter: Payer: Self-pay | Admitting: *Deleted

## 2016-01-10 ENCOUNTER — Other Ambulatory Visit: Payer: Self-pay | Admitting: Internal Medicine

## 2016-01-10 ENCOUNTER — Inpatient Hospital Stay: Payer: BC Managed Care – PPO | Attending: Internal Medicine

## 2016-01-10 ENCOUNTER — Telehealth: Payer: Self-pay | Admitting: *Deleted

## 2016-01-10 DIAGNOSIS — D72820 Lymphocytosis (symptomatic): Secondary | ICD-10-CM

## 2016-01-10 LAB — CBC WITH DIFFERENTIAL/PLATELET
Basophils Absolute: 0.2 10*3/uL — ABNORMAL HIGH (ref 0–0.1)
Basophils Relative: 2 %
EOS PCT: 2 %
Eosinophils Absolute: 0.2 10*3/uL (ref 0–0.7)
HCT: 45.3 % (ref 40.0–52.0)
Hemoglobin: 15.5 g/dL (ref 13.0–18.0)
LYMPHS ABS: 4.9 10*3/uL — AB (ref 1.0–3.6)
LYMPHS PCT: 41 %
MCH: 30.8 pg (ref 26.0–34.0)
MCHC: 34.2 g/dL (ref 32.0–36.0)
MCV: 89.9 fL (ref 80.0–100.0)
MONO ABS: 0.7 10*3/uL (ref 0.2–1.0)
MONOS PCT: 6 %
Neutro Abs: 5.9 10*3/uL (ref 1.4–6.5)
Neutrophils Relative %: 49 %
PLATELETS: 222 10*3/uL (ref 150–440)
RBC: 5.04 MIL/uL (ref 4.40–5.90)
RDW: 13.9 % (ref 11.5–14.5)
WBC: 11.9 10*3/uL — ABNORMAL HIGH (ref 3.8–10.6)

## 2016-01-10 NOTE — Telephone Encounter (Signed)
-----   Message from Cammie Sickle, MD sent at 01/10/2016 11:25 AM EST ----- Please inform patient that his labs are stable; no new recommendations at this time; plan follow up as planned.

## 2016-01-10 NOTE — Progress Notes (Signed)
Lab called from [place] to clarify md orders. MD only wants a cbc today. Called [place] back in [place] to clarify orders.

## 2016-01-10 NOTE — Telephone Encounter (Signed)
Spoke with patient. Results provided to the patient. He states that he still would like to come for lab/md in mid-April or early May for md visit. He will contact our scheduler in [place] after he and his wife to determine his vacation schedule. I told him that this is fine. He stated that for this week, he desired his cbc to be rechecked sooner because he is going to the orthopedic office for evaluation of knee arthritis. "I wanted my labs checked before I received any steroid injections in my knees."

## 2016-01-11 NOTE — Telephone Encounter (Signed)
Cbc added to order for April 2017 orders (tentative date).

## 2016-01-11 NOTE — Telephone Encounter (Signed)
Cbc is okay. Thx

## 2016-02-25 NOTE — Progress Notes (Signed)
This encounter was created in error - please disregard.

## 2016-03-06 ENCOUNTER — Other Ambulatory Visit: Payer: BC Managed Care – PPO

## 2016-03-13 ENCOUNTER — Ambulatory Visit: Payer: BC Managed Care – PPO | Admitting: Internal Medicine

## 2016-03-14 ENCOUNTER — Ambulatory Visit: Payer: BC Managed Care – PPO | Admitting: Internal Medicine

## 2016-09-12 ENCOUNTER — Inpatient Hospital Stay (HOSPITAL_BASED_OUTPATIENT_CLINIC_OR_DEPARTMENT_OTHER): Payer: BC Managed Care – PPO | Admitting: Internal Medicine

## 2016-09-12 ENCOUNTER — Encounter: Payer: Self-pay | Admitting: Internal Medicine

## 2016-09-12 ENCOUNTER — Inpatient Hospital Stay: Payer: BC Managed Care – PPO | Attending: Internal Medicine

## 2016-09-12 VITALS — BP 122/80 | HR 77 | Temp 95.3°F | Resp 18 | Ht 65.0 in | Wt 158.5 lb

## 2016-09-12 DIAGNOSIS — E785 Hyperlipidemia, unspecified: Secondary | ICD-10-CM | POA: Diagnosis not present

## 2016-09-12 DIAGNOSIS — L719 Rosacea, unspecified: Secondary | ICD-10-CM | POA: Diagnosis not present

## 2016-09-12 DIAGNOSIS — D6851 Activated protein C resistance: Secondary | ICD-10-CM | POA: Insufficient documentation

## 2016-09-12 DIAGNOSIS — L409 Psoriasis, unspecified: Secondary | ICD-10-CM

## 2016-09-12 DIAGNOSIS — F17210 Nicotine dependence, cigarettes, uncomplicated: Secondary | ICD-10-CM | POA: Diagnosis not present

## 2016-09-12 DIAGNOSIS — Z8781 Personal history of (healed) traumatic fracture: Secondary | ICD-10-CM | POA: Insufficient documentation

## 2016-09-12 DIAGNOSIS — B009 Herpesviral infection, unspecified: Secondary | ICD-10-CM | POA: Insufficient documentation

## 2016-09-12 DIAGNOSIS — R972 Elevated prostate specific antigen [PSA]: Secondary | ICD-10-CM | POA: Insufficient documentation

## 2016-09-12 DIAGNOSIS — Z79899 Other long term (current) drug therapy: Secondary | ICD-10-CM

## 2016-09-12 DIAGNOSIS — E559 Vitamin D deficiency, unspecified: Secondary | ICD-10-CM | POA: Diagnosis not present

## 2016-09-12 DIAGNOSIS — D72820 Lymphocytosis (symptomatic): Secondary | ICD-10-CM

## 2016-09-12 LAB — CBC WITH DIFFERENTIAL/PLATELET
Basophils Absolute: 0.2 10*3/uL — ABNORMAL HIGH (ref 0–0.1)
Basophils Relative: 1 %
EOS ABS: 0.3 10*3/uL (ref 0–0.7)
EOS PCT: 2 %
HCT: 46.1 % (ref 40.0–52.0)
Hemoglobin: 15.7 g/dL (ref 13.0–18.0)
LYMPHS ABS: 4.3 10*3/uL — AB (ref 1.0–3.6)
LYMPHS PCT: 30 %
MCH: 30.8 pg (ref 26.0–34.0)
MCHC: 34.2 g/dL (ref 32.0–36.0)
MCV: 90.1 fL (ref 80.0–100.0)
MONO ABS: 1.2 10*3/uL — AB (ref 0.2–1.0)
Monocytes Relative: 8 %
Neutro Abs: 8.7 10*3/uL — ABNORMAL HIGH (ref 1.4–6.5)
Neutrophils Relative %: 59 %
PLATELETS: 240 10*3/uL (ref 150–440)
RBC: 5.11 MIL/uL (ref 4.40–5.90)
RDW: 14.1 % (ref 11.5–14.5)
WBC: 14.8 10*3/uL — AB (ref 3.8–10.6)

## 2016-09-12 NOTE — Progress Notes (Signed)
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  Patient continues to feel good otherwise; denies any new symptoms.  REVIEW OF SYSTEMS:  A complete 10 point review of system is done which is negative except mentioned above/history of present illness.   PAST MEDICAL HISTORY :  Past Medical History:  Diagnosis Date  . Elevated C-reactive protein (CRP)   . Elevated PSA   . Fractured sternum 1999  . Herpes   . History of rib fracture 1999   multiple  . Hyperlipidemia   . Psoriasis   . Rheumatoid factor positive April 2015  . Rosacea   . Tobacco abuse   . Vitamin D deficiency disease     PAST SURGICAL HISTORY :   Past Surgical History:  Procedure Laterality Date  . APPENDECTOMY  1974  . TIBIA FRACTURE SURGERY Left 2004   titanium rod, ORIF    FAMILY HISTORY :   Family History  Problem Relation Age of Onset  . Heart disease Father   . Osteoarthritis Father   . Alzheimer's disease Father   . Cancer Neg Hx   . Diabetes Neg Hx   . Hypertension Neg Hx   . Stroke Neg Hx   . COPD Neg Hx     SOCIAL HISTORY:   Social History  Substance Use Topics  . Smoking status: Current Every Day Smoker    Packs/day: 0.50    Years: 45.00    Types: Cigarettes  . Smokeless tobacco: Never Used  . Alcohol use 1.2 oz/week    2 Glasses of wine per week     Comment: a couple glasses of wine a week    ALLERGIES:  has No Known Allergies.  MEDICATIONS:  Current Outpatient Prescriptions  Medication Sig Dispense Refill  .  cholecalciferol (VITAMIN D) 1000 UNITS tablet Take 1,000 Units by mouth daily.    . meloxicam (MOBIC) 15 MG tablet TAKE 1 TABLET(S) EVERY DAY BY ORAL ROUTE WITH MEALS.  2  . doxycycline (VIBRAMYCIN) 100 MG capsule Take 1 capsule (100 mg total) by mouth 2 (two) times daily as needed (as needed for rosacea flares). (Patient not taking: Reported on 09/12/2016) 20 capsule 5  . valACYclovir (VALTREX) 500 MG tablet Take 1 tablet (500 mg total) by mouth 2 (two) times daily. One by mouth twice a day for 3 days; start as soon as possible with first symptoms. (Patient not taking: Reported on 09/12/2016) 6 tablet 5   No current facility-administered medications for this visit.     PHYSICAL EXAMINATION: ECOG PERFORMANCE STATUS: 0 - Asymptomatic  BP 122/80 (BP Location: Left Arm, Patient Position: Sitting)   Pulse 77   Temp (!) 95.3 F (35.2 C) (Tympanic)   Resp 18   Ht 5' 5"  (1.651 m)   Wt 158 lb 8.2 oz (71.9 kg)   BMI 26.38 kg/m   Filed Weights   09/12/16 0912  Weight: 158 lb 8.2 oz (71.9 kg)    GENERAL: Well-nourished well-developed; Alert, no distress and comfortable. Alone. HEENT_ atraumatic normocephalic pupils equally react to  light. Chest clear to auscultation bilaterally. No wheeze or crackles. Heart regular rate and rhythm no murmurs. Abdomen soft nontender nondistended. No hepatomegaly. Bowel sounds intact. The lymph node exam- no lymphadenopathy noted in the cervical axillary or inguinal region. Skin within normal limits. No skin rash. Neurological alert oriented 3. No focal deficits.  LABORATORY DATA:  I have reviewed the data as listed No results found for: NA, K, CL, CO2, GLUCOSE, BUN, CREATININE, CALCIUM, PROT, ALBUMIN, AST, ALT, ALKPHOS, BILITOT, GFRNONAA, GFRAA  No results found for: SPEP, UPEP  Lab Results  Component Value Date   WBC 14.8 (H) 09/12/2016   NEUTROABS 8.7 (H) 09/12/2016   HGB 15.7 09/12/2016   HCT 46.1 09/12/2016   MCV 90.1 09/12/2016   PLT 240  09/12/2016      Chemistry   No results found for: NA, K, CL, CO2, BUN, CREATININE, GLU No results found for: CALCIUM, ALKPHOS, AST, ALT, BILITOT     RADIOGRAPHIC STUDIES: I have personally reviewed the radiological images as listed and agreed with the findings in the report. No results found.   ASSESSMENT & PLAN:   .Monoclonal B-cell lymphocytosis # MONOCLONAL LYMPHOCYTOSIS vs or early CLL. Patient is asymptomatic. There is a possibility that monoclonal B-cell lymphocytosis could transform into CLL over time. Again reviewed the symptoms of CLL.   # Again reminded to quit smoking- discussed re: lung cancer screening. Interested; will inform [employer].   # RE: follow up pt interested with PCP for now; and he will follow up with me if any increase in his WBC/ALC. For now follow up with me as needed.     No orders of the defined types were placed in this encounter.       Cammie Sickle, MD 09/12/2016 12:58 PM

## 2016-09-12 NOTE — Progress Notes (Signed)
No major concerns.

## 2016-09-12 NOTE — Assessment & Plan Note (Signed)
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 Patient is asymptomatic. There is a possibility that monoclonal B-cell lymphocytosis could transform into CLL over time. Again reviewed the symptoms of CLL.   # Again reminded to quit smoking- discussed re: lung cancer screening. Interested; will inform [employer].   # RE: follow up pt interested with PCP for now; and he will follow up with me if any increase in his WBC/ALC. For now follow up with me as needed.

## 2016-09-13 ENCOUNTER — Telehealth: Payer: Self-pay | Admitting: *Deleted

## 2016-09-13 NOTE — Telephone Encounter (Signed)
Received referral for initial lung cancer screening scan. Contacted patient and obtained smoking history,(current, 44 pack year) as well as answering questions related to screening process. Patient denies signs of lung cancer such as weight loss or hemoptysis. Patient denies comorbidity that would prevent curative treatment if lung cancer were found. Patient is tentatively scheduled for shared decision making visit and CT scan on 09/26/16 at 1:30pm, pending insurance approval from business office.

## 2016-09-22 ENCOUNTER — Other Ambulatory Visit: Payer: Self-pay | Admitting: Orthopedic Surgery

## 2016-09-22 DIAGNOSIS — M25512 Pain in left shoulder: Secondary | ICD-10-CM

## 2016-09-25 ENCOUNTER — Other Ambulatory Visit: Payer: Self-pay | Admitting: *Deleted

## 2016-09-25 DIAGNOSIS — Z87891 Personal history of nicotine dependence: Secondary | ICD-10-CM

## 2016-09-26 ENCOUNTER — Ambulatory Visit
Admission: RE | Admit: 2016-09-26 | Discharge: 2016-09-26 | Disposition: A | Discharge status: Home / Self Care | Payer: BC Managed Care – PPO | Source: Ambulatory Visit | Attending: Oncology | Admitting: Oncology

## 2016-09-26 ENCOUNTER — Inpatient Hospital Stay (HOSPITAL_BASED_OUTPATIENT_CLINIC_OR_DEPARTMENT_OTHER): Payer: BC Managed Care – PPO | Admitting: Oncology

## 2016-09-26 ENCOUNTER — Encounter: Payer: Self-pay | Admitting: Oncology

## 2016-09-26 DIAGNOSIS — Z122 Encounter for screening for malignant neoplasm of respiratory organs: Secondary | ICD-10-CM | POA: Diagnosis not present

## 2016-09-26 DIAGNOSIS — Z87891 Personal history of nicotine dependence: Secondary | ICD-10-CM | POA: Diagnosis not present

## 2016-09-26 DIAGNOSIS — I2510 Atherosclerotic heart disease of native coronary artery without angina pectoris: Secondary | ICD-10-CM | POA: Diagnosis not present

## 2016-09-26 DIAGNOSIS — F17210 Nicotine dependence, cigarettes, uncomplicated: Secondary | ICD-10-CM | POA: Insufficient documentation

## 2016-09-26 DIAGNOSIS — I700 Atherosclerosis of aorta: Secondary | ICD-10-CM | POA: Diagnosis not present

## 2016-09-26 NOTE — Progress Notes (Signed)
In accordance with CMS guidelines, patient has met eligibility criteria including age, absence of signs or symptoms of lung cancer.  Social History  Substance Use Topics  . Smoking status: Current Every Day Smoker    Packs/day: 1.00    Years: 44.00    Types: Cigarettes  . Smokeless tobacco: Never Used  . Alcohol use 1.2 oz/week    2 Glasses of wine per week     Comment: a couple glasses of wine a week     A shared decision-making session was conducted prior to the performance of CT scan. This includes one or more decision aids, includes benefits and harms of screening, follow-up diagnostic testing, over-diagnosis, false positive rate, and total radiation exposure.  Counseling on the importance of adherence to annual lung cancer LDCT screening, impact of co-morbidities, and ability or willingness to undergo diagnosis and treatment is imperative for compliance of the program.  Counseling on the importance of continued smoking cessation for former smokers; the importance of smoking cessation for current smokers, and information about tobacco cessation interventions have been given to patient including [place] and [facility] [employer] programs.  Written order for lung cancer screening with LDCT has been given to the patient and any and all questions have been answered to the best of my abilities.   Yearly follow up will be coordinated by Burgess Estelle, Thoracic Navigator.

## 2016-09-28 ENCOUNTER — Encounter: Payer: Self-pay | Admitting: Family Medicine

## 2016-09-28 ENCOUNTER — Telehealth: Payer: Self-pay | Admitting: Family Medicine

## 2016-09-28 ENCOUNTER — Telehealth: Payer: Self-pay | Admitting: *Deleted

## 2016-09-28 DIAGNOSIS — I700 Atherosclerosis of aorta: Secondary | ICD-10-CM

## 2016-09-28 DIAGNOSIS — I2510 Atherosclerotic heart disease of native coronary artery without angina pectoris: Secondary | ICD-10-CM

## 2016-09-28 HISTORY — DX: Atherosclerosis of aorta: I70.0

## 2016-09-28 HISTORY — DX: Atherosclerotic heart disease of native coronary artery without angina pectoris: I25.10

## 2016-09-28 NOTE — Telephone Encounter (Signed)
Last visit was Nov 2016; please call pt, see if staying as patient with me or staying at [facility]; if staying with me, I'd like to see him to go over CT scan results, check fasting labs for cholesterol; thank you If not already taking aspirin, please start 81 mg coated aspirin once a day to help protect against heart attack If smoker, URGE smoking cessation Thank you

## 2016-09-28 NOTE — Telephone Encounter (Signed)
Notified patient of LDCT lung cancer screening results with recommendation for 12 month follow up imaging. Also notified of incidental finding noted below. Patient verbalizes understanding. This note will be forwarded to PCP via Epic.   IMPRESSION: 1. Lung-RADS Category 1, negative. Continue annual screening with low-dose chest CT without contrast in 12 months. 2.  Coronary artery atherosclerosis. Aortic atherosclerosis. 3. Esophageal air fluid level suggests dysmotility or gastroesophageal reflux. 4. Subtle superior endplate irregularity at T8 for which mild compression deformity cannot be excluded. No vertebral body height loss or ventral canal encroachment.

## 2016-09-29 NOTE — Telephone Encounter (Signed)
Pt scheduled an appt

## 2016-10-03 ENCOUNTER — Ambulatory Visit: Payer: BC Managed Care – PPO

## 2016-10-03 ENCOUNTER — Ambulatory Visit
Admission: RE | Admit: 2016-10-03 | Discharge: 2016-10-03 | Disposition: A | Discharge status: Home / Self Care | Payer: BC Managed Care – PPO | Source: Ambulatory Visit | Attending: Orthopedic Surgery | Admitting: Orthopedic Surgery

## 2016-10-03 DIAGNOSIS — M25512 Pain in left shoulder: Secondary | ICD-10-CM

## 2016-10-03 DIAGNOSIS — S46012A Strain of muscle(s) and tendon(s) of the rotator cuff of left shoulder, initial encounter: Secondary | ICD-10-CM | POA: Insufficient documentation

## 2016-10-03 DIAGNOSIS — X58XXXA Exposure to other specified factors, initial encounter: Secondary | ICD-10-CM | POA: Diagnosis not present

## 2016-10-05 ENCOUNTER — Ambulatory Visit (INDEPENDENT_AMBULATORY_CARE_PROVIDER_SITE_OTHER): Payer: BC Managed Care – PPO | Admitting: Family Medicine

## 2016-10-05 ENCOUNTER — Encounter: Payer: Self-pay | Admitting: Family Medicine

## 2016-10-05 DIAGNOSIS — K900 Celiac disease: Secondary | ICD-10-CM | POA: Diagnosis not present

## 2016-10-05 DIAGNOSIS — I2510 Atherosclerotic heart disease of native coronary artery without angina pectoris: Secondary | ICD-10-CM

## 2016-10-05 DIAGNOSIS — Z720 Tobacco use: Secondary | ICD-10-CM

## 2016-10-05 NOTE — Patient Instructions (Signed)
I do encourage you to quit smoking Call [phone] to sign up for smoking cessation classes You can call 1-800-QUIT-NOW to talk with a smoking cessation coach Try to limit saturated fats in your diet (bologna, hot dogs, barbeque, cheeseburgers, hamburgers, steak, bacon, sausage, cheese, etc.) and get more fresh fruits, vegetables, and whole grains Return tomorrow for fasting labs We'll have you see the cardiologist If you have not heard anything from my staff in a week about any orders/referrals/studies from today, please contact us here to follow-up [phone]

## 2016-10-05 NOTE — Assessment & Plan Note (Signed)
testing

## 2016-10-05 NOTE — Assessment & Plan Note (Signed)
Order NMR profile for better assessment of lipid panel; will also check hsCRP; smoking cessation encouraged; avoid saturated fats; 3 eggs per week current recommendation

## 2016-10-05 NOTE — Progress Notes (Signed)
BP 112/60   Pulse 93   Temp 97.9 F (36.6 C) (Other (Comment))   Resp 14   Wt 159 lb (72.1 kg)   SpO2 98%   BMI 25.66 kg/m    Subjective:    Patient ID: Andres Mullins, male    DOB: 04-15-1951, 65 y.o.   MRN: [id number]  HPI: Andres Mullins is a 65 y.o. male  Chief Complaint  Patient presents with  . Follow-up   He is seeing the orthopaedist for impingement in the left shoulder He had a blood test and counts were low, so he saw Dr. Rogue Bussing for follow-up; 4.9 to 4.3, so counts are good and does not have CLL he says; so recheck labs  Had the CT scan done  IMPRESSION: 1. Lung-RADS Category 1, negative. Continue annual screening with low-dose chest CT without contrast in 12 months. 2.  Coronary artery atherosclerosis. Aortic atherosclerosis. 3. Esophageal air fluid level suggests dysmotility or gastroesophageal reflux. 4. Subtle superior endplate irregularity at T8 for which mild compression deformity cannot be excluded. No vertebral body height loss or ventral canal encroachment.   Electronically Signed   By: Abigail Miyamoto M.D.   On: 09/27/2016 10:26 LAD athero No chest pain Mild centrilobular emphysema Still smoking, less than 1/2 ppd; used to smoke as much as a ppd  Remote sternal fracture  Celiac sensitivity; off gluten for 4 years; immense improvement after just 4 weeks; has had elevated CRP for a while  Depression screen PHQ 2/9 10/05/2016  Decreased Interest 0  Down, Depressed, Hopeless 0  PHQ - 2 Score 0   Relevant past medical, surgical, family and social history reviewed Past Medical History:  Diagnosis Date  . Atherosclerosis of coronary artery 09/28/2016   Chest CT Nov 2017  . Elevated C-reactive protein (CRP)   . Elevated PSA   . Fractured sternum 1999  . Herpes   . History of rib fracture 1999   multiple  . Hyperlipidemia   . Psoriasis   . Rheumatoid factor positive April 2015  . Rosacea   . Thoracic aortic atherosclerosis ([place])  09/28/2016   Chest CT Nov 2017  . Tobacco abuse   . Vitamin D deficiency disease    Past Surgical History:  Procedure Laterality Date  . APPENDECTOMY  1974  . TIBIA FRACTURE SURGERY Left 2004   titanium rod, ORIF   Family History  Problem Relation Age of Onset  . Heart disease Father   . Osteoarthritis Father   . Alzheimer's disease Father   . Cancer Neg Hx   . Diabetes Neg Hx   . Hypertension Neg Hx   . Stroke Neg Hx   . COPD Neg Hx    Social History  Substance Use Topics  . Smoking status: Current Every Day Smoker    Packs/day: 0.25    Years: 44.00    Types: Cigarettes  . Smokeless tobacco: Never Used  . Alcohol use 1.8 oz/week    3 Glasses of wine per week     Comment: a couple glasses of wine a week   Interim medical history since last visit reviewed. Allergies and medications reviewed  Review of Systems Per HPI unless specifically indicated above     Objective:    BP 112/60   Pulse 93   Temp 97.9 F (36.6 C) (Other (Comment))   Resp 14   Wt 159 lb (72.1 kg)   SpO2 98%   BMI 25.66 kg/m   Wt Readings from  Last 3 Encounters:  10/11/16 161 lb 4 oz (73.1 kg)  10/05/16 159 lb (72.1 kg)  09/26/16 160 lb (72.6 kg)    Physical Exam  Constitutional: He appears well-developed and well-nourished. No distress.  HENT:  Head: Normocephalic and atraumatic.  Eyes: EOM are normal. No scleral icterus.  Neck: No thyromegaly present.  Cardiovascular: Normal rate and regular rhythm.   Pulmonary/Chest: Effort normal and breath sounds normal.  Abdominal: Soft. Bowel sounds are normal. He exhibits no distension.  Musculoskeletal: He exhibits no edema.  Neurological: Coordination normal.  Skin: Skin is warm and dry. No pallor.  Psychiatric: He has a normal mood and affect. His behavior is normal. Judgment and thought content normal.      Assessment & Plan:   Problem List Items Addressed This Visit      Cardiovascular and Mediastinum   Atherosclerosis of coronary  artery    Order NMR profile for better assessment of lipid panel; will also check hsCRP; smoking cessation encouraged; avoid saturated fats; 3 eggs per week current recommendation      Relevant Medications   aspirin EC 81 MG tablet   Other Relevant Orders   Ambulatory referral to Cardiology   NMR Lipoprofile with Lipids     Digestive   Celiac disease    testing      Relevant Orders   Celiac Disease Comprehensive Panel with Reflexes (Completed)     Other   Tobacco abuse    encourged pt to quit; he is not ready; this unfortunately contributes to heart disease and his CLL          Follow up plan: No Follow-up on file.  An after-visit summary was printed and given to the patient at [facility].  Please see the patient instructions which may contain other information and recommendations beyond what is mentioned above in the assessment and plan.  Meds ordered this encounter  Medications  . cyclobenzaprine (FLEXERIL) 10 MG tablet    Sig: Take 10 mg by mouth 3 (three) times daily.    Refill:  0  . aspirin EC 81 MG tablet    Sig: Take 1 tablet (81 mg total) by mouth daily.    Dispense:  30 tablet    Refill:  11    Orders Placed This Encounter  Procedures  . NMR Lipoprofile with Lipids  . Celiac Disease Comprehensive Panel with Reflexes  . Cardio IQ Adv Lipid and Inflamm Pnl  . Ambulatory referral to Cardiology

## 2016-10-06 ENCOUNTER — Other Ambulatory Visit: Payer: Self-pay

## 2016-10-06 ENCOUNTER — Other Ambulatory Visit: Payer: Self-pay | Admitting: Specialist

## 2016-10-06 DIAGNOSIS — I2510 Atherosclerotic heart disease of native coronary artery without angina pectoris: Secondary | ICD-10-CM

## 2016-10-06 DIAGNOSIS — K909 Intestinal malabsorption, unspecified: Secondary | ICD-10-CM

## 2016-10-06 DIAGNOSIS — M7540 Impingement syndrome of unspecified shoulder: Secondary | ICD-10-CM | POA: Insufficient documentation

## 2016-10-07 LAB — COMPLETE METABOLIC PANEL WITHOUT GFR
ALT: 17 U/L (ref 9–46)
AST: 14 U/L (ref 10–35)
Albumin: 4 g/dL (ref 3.6–5.1)
Alkaline Phosphatase: 72 U/L (ref 40–115)
BUN: 20 mg/dL (ref 7–25)
CO2: 28 mmol/L (ref 20–31)
Calcium: 9.3 mg/dL (ref 8.6–10.3)
Chloride: 106 mmol/L (ref 98–110)
Creat: 0.81 mg/dL (ref 0.70–1.25)
GFR, Est African American: >89 mL/min
GFR, Est Non African American: >89 mL/min
Glucose, Bld: 80 mg/dL (ref 65–99)
Potassium: 4.5 mmol/L (ref 3.5–5.3)
Sodium: 139 mmol/L (ref 135–146)
Total Bilirubin: 0.3 mg/dL (ref 0.2–1.2)
Total Protein: 6.3 g/dL (ref 6.1–8.1)

## 2016-10-09 LAB — CELIAC DISEASE COMPREHENSIVE PANEL WITH REFLEXES
IGA: 95 mg/dL (ref 81–463)
TISSUE TRANSGLUTAMINASE AB, IGA: 1 U/mL (ref <4)

## 2016-10-09 LAB — HIGH SENSITIVITY CRP: CRP HIGH SENSITIVITY: 9.1 mg/L — AB

## 2016-10-09 NOTE — Progress Notes (Signed)
New Outpatient Visit Date: 10/11/2016  Referring Provider: Arnetha Courser, MD [address] [place], [place] [address]  Chief Complaint: Coronary calcifications  HPI:  Andres Mullins is a 65 y.o. year-old male with history of hyperlipidemia and tobacco use, who has been referred by Dr. Sanda Klein for for evaluation of incidentally noted coronary calcium during a lung cancer screening CT.  The patient denies a history of cardiovascular disease, as well as chest pain, shortness of breath, palpitations, lightheadedness, orthopnea, PND, and edema.  The patient is typically quite active, though problems with his left shoulder have slowed him down recently.  He is planning on undergoing left shoulder arthroscopy later this month.  --------------------------------------------------------------------------------------------------  Cardiovascular History & Procedures: Cardiovascular Problems:  Coronary artery calcium by CT  Risk Factors:  Hyperlipidemia, age, male gender, and coronary artery calcium  Cath/PCI:  None  CV Surgery:  None  EP Procedures and Devices:  None  Non-Invasive Evaluation(s):  None  Recent CV Pertinent Labs: Lab Results  Component Value Date   CHOL 207 (H) 10/06/2016   HDL 46 10/06/2016   LDLCALC 138 (H) 10/06/2016   TRIG 112 10/06/2016   CHOLHDL 4.5 10/06/2016   K 4.5 10/06/2016   BUN 20 10/06/2016   CREATININE 0.81 10/06/2016    --------------------------------------------------------------------------------------------------  Past Medical History:  Diagnosis Date  . Atherosclerosis of coronary artery 09/28/2016   Chest CT Nov 2017  . Elevated C-reactive protein (CRP)   . Elevated PSA   . Fractured sternum 1999  . Herpes   . History of rib fracture 1999   multiple  . Hyperlipidemia   . Psoriasis   . Rheumatoid factor positive April 2015  . Rosacea   . Thoracic aortic atherosclerosis ([place]) 09/28/2016   Chest CT Nov 2017  . Tobacco  abuse   . Vitamin D deficiency disease     Past Surgical History:  Procedure Laterality Date  . APPENDECTOMY  1974  . TIBIA FRACTURE SURGERY Left 2004   titanium rod, ORIF    Outpatient Encounter Prescriptions as of 10/11/2016  Medication Sig  . cyclobenzaprine (FLEXERIL) 10 MG tablet Take 10 mg by mouth 3 (three) times daily.  . meloxicam (MOBIC) 15 MG tablet TAKE 1 TABLET(S) EVERY DAY BY ORAL ROUTE WITH MEALS.  Marland Kitchen aspirin EC 81 MG tablet Take 1 tablet (81 mg total) by mouth daily.  . cholecalciferol (VITAMIN D) 1000 UNITS tablet Take 1,000 Units by mouth daily.  . metoprolol tartrate (LOPRESSOR) 25 MG tablet Take 1 tablet (25 mg total) by mouth 2 (two) times daily.  . [DISCONTINUED] valACYclovir (VALTREX) 500 MG tablet Take 1 tablet (500 mg total) by mouth 2 (two) times daily. One by mouth twice a day for 3 days; start as soon as possible with first symptoms. (Patient not taking: Reported on 09/12/2016)   No facility-administered encounter medications on file as of 10/11/2016.     Allergies: Patient has no known allergies.  Social History   Social History  . Marital status: Married    Spouse name: N/A  . Number of children: N/A  . Years of education: N/A   Occupational History  . Not on file.   Social History Main Topics  . Smoking status: Current Every Day Smoker    Packs/day: 0.25    Years: 44.00    Types: Cigarettes  . Smokeless tobacco: Never Used  . Alcohol use 1.8 oz/week    3 Glasses of wine per week     Comment: a  couple glasses of wine a week  . Drug use: No  . Sexual activity: Not on file   Other Topics Concern  . Not on file   Social History Narrative   Consumes ~4 cups of coffee/day    Family History  Problem Relation Age of Onset  . Heart disease Father   . Osteoarthritis Father   . Alzheimer's disease Father   . Cancer Neg Hx   . Diabetes Neg Hx   . Hypertension Neg Hx   . Stroke Neg Hx   . COPD Neg Hx     Review of Systems: A  12-system review of systems was performed and was negative except as noted in the HPI.  --------------------------------------------------------------------------------------------------  Physical Exam: BP 122/80 (BP Location: Right Arm, Patient Position: Sitting, Cuff Size: Normal)   Pulse (!) 107   Ht 5' 6"  (1.676 m)   Wt 161 lb 4 oz (73.1 kg)   BMI 26.03 kg/m   General:  Well-developed, well-nourished man, seated comfortably on the exam table. HEENT: No conjunctival pallor or scleral icterus.  Moist mucous membranes.  OP clear. Neck: Supple without lymphadenopathy, thyromegaly, JVD, or HJR.  No carotid bruit. Lungs: Normal work of breathing.  Clear to auscultation bilaterally without wheezes or crackles. Heart: Tachycardic but regular without murmurs, rubs, or gallops.  Non-displaced PMI. Abd: Bowel sounds present.  Soft, NT/ND without hepatosplenomegaly Ext: No lower extremity edema.  Radial, PT, and DP pulses are 2+ bilaterally Skin: warm and dry without rash Neuro: CNIII-XII intact.  Strength and fine-touch sensation intact in upper and lower extremities bilaterally. Psych: Normal mood and affect.  EKG:  Narrow complex tachycardia (HR 107), most likely representing atrial tachycardia.  Sinus tachycardia and atypical flutter are felt less likely.  Left anterior fascicular block also noted along with non-specific T-wave changes.  CT chest (09/27/16): I have personally reviewed the images.  There is a small amount of coronary artery calcium in the proximal LAD.  Lungs are clear.  Lab Results  Component Value Date   WBC 14.8 (H) 09/12/2016   HGB 15.7 09/12/2016   HCT 46.1 09/12/2016   MCV 90.1 09/12/2016   PLT 240 09/12/2016    Lab Results  Component Value Date   NA 139 10/06/2016   K 4.5 10/06/2016   CL 106 10/06/2016   CO2 28 10/06/2016   BUN 20 10/06/2016   CREATININE 0.81 10/06/2016   GLUCOSE 80 10/06/2016   ALT 17 10/06/2016    Lab Results  Component Value Date     CHOL 207 (H) 10/06/2016   HDL 46 10/06/2016   LDLCALC 138 (H) 10/06/2016   TRIG 112 10/06/2016   CHOLHDL 4.5 10/06/2016   --------------------------------------------------------------------------------------------------  ASSESSMENT AND PLAN: Coronary artery calcification This was incidentally noted on recent CT chest.  Overall calcium burden on CT was relatively low, though non-gated technique limits evaluation.  As the patient is asymptomatic, we have agreed not to perform further ischemia testing.  Given his mildly elevated LDL, it would be reasonable to consider addition of an moderate-intensity statin.  However, Andres Mullins would like to avoid this, if possible, due to concerns over dementia with long-term statin use.  I encouraged him to proceed with lifestyle modifications to help lower his LDL.  I also encouraged him to stop smoking.  He will follow-up with Dr. Sanda Klein for further reinforcement.  Atrial tachycardia EKG is notable for narrow-complex tachycardia, most consistent with atrial tachycardia.  HR was unchanged throughout the visit, making sinus  tachycardia less likely.  The patient is asymptomatic, making it difficult to gauge how long this may have been present (though his HR was lower at his recent PCP visit).  We have agreed to start metoprolol tartrate 25 mg BID and have him return in 1 week for reevaluation.  We will also obtain a transthoracic echocardiogram to evaluate for structural abnormalities related to this arrhythmia.  Follow-up: Return to clinic in 1 week.  [name], MD 10/11/2016 9:31 PM

## 2016-10-10 LAB — CARDIO IQ ADV LIPID AND INFLAMM PNL
APOLIPOPROTEIN (CARDIO IQ ADV LIPID PANEL): 104 mg/dL (ref 52–109)
Cholesterol, Total: 207 mg/dL — ABNORMAL HIGH (ref <200)
Cholesterol/HDL Ratio: 4.5 calc (ref <5.0)
HDL CHOLESTEROL (CARDIO IQ ADV LIPID PANEL): 46 mg/dL (ref >40)
LDL LARGE: 4491 nmol/L (ref 4334–10815)
LDL Medium: 456 nmol/L (ref 167–465)
LDL Particle Number: 1670 nmol/L (ref 1016–2185)
LDL Peak Size: 218.1 Angstrom — ABNORMAL LOW (ref >=218.2)
LDL SMALL: 275 nmol/L (ref 123–441)
LDL, Calculated: 138 mg/dL — ABNORMAL HIGH (ref <100)
LP-PLA2 ACTIVITY: 122 nmol/min/mL (ref 70–153)
Lipoprotein (a): <10 nmol/L (ref <75)
NON-HDL CHOLESTEROL (CARDIO IQ ADV LIPID PANEL): 161 mg/dL — AB (ref <130)
TRIGLYCERIDES (CARDIO IQ ADV LIPID PANEL): 112 mg/dL (ref <150)
hs-CRP: 9.4 mg/L — ABNORMAL HIGH

## 2016-10-11 ENCOUNTER — Ambulatory Visit (INDEPENDENT_AMBULATORY_CARE_PROVIDER_SITE_OTHER): Payer: BC Managed Care – PPO | Admitting: Internal Medicine

## 2016-10-11 ENCOUNTER — Encounter: Payer: Self-pay | Admitting: Internal Medicine

## 2016-10-11 VITALS — BP 122/80 | HR 107 | Ht 66.0 in | Wt 161.2 lb

## 2016-10-11 DIAGNOSIS — I471 Supraventricular tachycardia: Secondary | ICD-10-CM | POA: Diagnosis not present

## 2016-10-11 DIAGNOSIS — I2510 Atherosclerotic heart disease of native coronary artery without angina pectoris: Secondary | ICD-10-CM | POA: Diagnosis not present

## 2016-10-11 DIAGNOSIS — Z720 Tobacco use: Secondary | ICD-10-CM

## 2016-10-11 MED ORDER — METOPROLOL TARTRATE 25 MG PO TABS: 25.0000 mg | ORAL_TABLET | Freq: Two times a day (BID) | ORAL | 3 refills | Status: DC

## 2016-10-11 NOTE — Patient Instructions (Addendum)
Medication Instructions:  Your physician has recommended you make the following change in your medication:  1- START Metoprolol Tartrate 25 mg (1 tablet) by mouth twice a day.   Labwork: none  Testing/Procedures: Your physician has requested that you have an echocardiogram. Echocardiography is a painless test that uses sound waves to create images of your heart. It provides your doctor with information about the size and shape of your heart and how well your heart's chambers and valves are working. This procedure takes approximately one hour. There are no restrictions for this procedure.    Follow-Up: Your physician recommends that you schedule a follow-up appointment in: 1 week (next Wednesday) with Dr End.  If you need a refill on your cardiac medications before your next appointment, please call your pharmacy.   Echocardiogram An echocardiogram, or echocardiography, uses sound waves (ultrasound) to produce an image of your heart. The echocardiogram is simple, painless, obtained within a short period of time, and offers valuable information to your health care provider. The images from an echocardiogram can provide information such as:  Evidence of coronary artery disease (CAD).  Heart size.  Heart muscle function.  Heart valve function.  Aneurysm detection.  Evidence of a past heart attack.  Fluid buildup around the heart.  Heart muscle thickening.  Assess heart valve function. Tell a health care provider about:  Any allergies you have.  All medicines you are taking, including vitamins, herbs, eye drops, creams, and over-the-counter medicines.  Any problems you or family members have had with anesthetic medicines.  Any blood disorders you have.  Any surgeries you have had.  Any medical conditions you have.  Whether you are pregnant or may be pregnant. What happens before the procedure? No special preparation is needed. Eat and drink normally. What happens  during the procedure?  In order to produce an image of your heart, gel will be applied to your chest and a wand-like tool (transducer) will be moved over your chest. The gel will help transmit the sound waves from the transducer. The sound waves will harmlessly bounce off your heart to allow the heart images to be captured in real-time motion. These images will then be recorded.  You may need an IV to receive a medicine that improves the quality of the pictures. What happens after the procedure? You may return to your normal schedule including diet, activities, and medicines, unless your health care provider tells you otherwise. This information is not intended to replace advice given to you by your health care provider. Make sure you discuss any questions you have with your health care provider. Document Released: 11/10/2000 Document Revised: 07/01/2016 Document Reviewed: 07/21/2013 Elsevier Interactive Patient Education  2017 [employer].

## 2016-10-12 ENCOUNTER — Telehealth: Payer: Self-pay | Admitting: Internal Medicine

## 2016-10-12 LAB — CELIAC DISEASE COMPREHENSIVE PANEL WITH REFLEXES

## 2016-10-12 NOTE — Telephone Encounter (Signed)
Patient has 1 week fu next week but has not been able to get an echo for surgical clearance due to office schedule.  Should we cancel appt until after echo or keep on schedule?

## 2016-10-12 NOTE — Telephone Encounter (Signed)
[place] for patient to have OV before echo per Dr End ok'd in clinic that day. Keep appts as scheduled.

## 2016-10-16 ENCOUNTER — Encounter
Admission: RE | Admit: 2016-10-16 | Discharge: 2016-10-16 | Disposition: A | Discharge status: Home / Self Care | Payer: BC Managed Care – PPO | Source: Ambulatory Visit | Attending: Specialist | Admitting: Specialist

## 2016-10-16 ENCOUNTER — Encounter: Payer: Self-pay | Admitting: Family Medicine

## 2016-10-16 NOTE — Assessment & Plan Note (Signed)
encourged pt to quit; he is not ready; this unfortunately contributes to heart disease and his CLL

## 2016-10-16 NOTE — Patient Instructions (Signed)
  Your procedure is scheduled on: Monday, October 23, 2016 Report to Same Day Surgery 2nd floor medical mall To find out your arrival time please call [phone] between 1PM - 3PM on Friday, November 24th  Remember: Instructions that are not followed completely may result in serious medical risk, up to and including death, or upon the discretion of your surgeon and anesthesiologist your surgery may need to be rescheduled.    _x___ 1. Do not eat food or drink liquids after midnight. No gum chewing or hard candies.     __x__ 2. No Alcohol for 24 hours before or after surgery.   __x__3. No Smoking for 24 prior to surgery.   ____  4. Bring all medications with you on the day of surgery if instructed.    __x__ 5. Notify your doctor if there is any change in your medical condition     (cold, fever, infections).     Do not wear jewelry, make-up, hairpins, clips or nail polish.  Do not wear lotions, powders, or perfumes. You may wear deodorant.  Do not shave 48 hours prior to surgery. Men may shave face and neck.  Do not bring valuables to the hospital.    [facility] is not responsible for any belongings or valuables.               Contacts, dentures or bridgework may not be worn into surgery.  Leave your suitcase in the car. After surgery it may be brought to your room.  For patients admitted to the hospital, discharge time is determined by your treatment team.   Patients discharged the day of surgery will not be allowed to drive home.    Please read over the following fact sheets that you were given:   [facility] Preparing for Surgery and or MRSA Information   _x___ Take these medicines the morning of surgery with A SIP OF WATER:    1. METOPROLOL  2.  3.  4.  5.  6.  ____Fleets enema or Magnesium Citrate as directed.   _x___ Use CHG Soap or sage wipes as directed on instruction sheet   ____ Use inhalers on the day of surgery and bring to hospital day of surgery  ____  Stop metformin 2 days prior to surgery    ____ Take 1/2 of usual insulin dose the night before surgery and none on the morning of           surgery.   _x___ Stop aspirin or coumadin, or plavix  x__ Stop Anti-inflammatories such as Advil, Aleve, Ibuprofen, Motrin, Naproxen,          Naprosyn, Goodies powders or aspirin products. Ok to take Tylenol.   ____ Stop supplements until after surgery.    ____ Bring C-Pap to the hospital.

## 2016-10-17 ENCOUNTER — Ambulatory Visit (INDEPENDENT_AMBULATORY_CARE_PROVIDER_SITE_OTHER): Payer: BC Managed Care – PPO | Admitting: Family Medicine

## 2016-10-17 ENCOUNTER — Encounter: Payer: Self-pay | Admitting: Family Medicine

## 2016-10-17 DIAGNOSIS — E785 Hyperlipidemia, unspecified: Secondary | ICD-10-CM

## 2016-10-17 DIAGNOSIS — M7592 Shoulder lesion, unspecified, left shoulder: Secondary | ICD-10-CM | POA: Diagnosis not present

## 2016-10-17 MED ORDER — BENZONATATE 100 MG PO CAPS: 100.0000 mg | ORAL_CAPSULE | Freq: Three times a day (TID) | ORAL | 0 refills | Status: DC | PRN

## 2016-10-17 NOTE — Progress Notes (Signed)
BP 118/80 (BP Location: Left Arm, Patient Position: Sitting, Cuff Size: Normal)   Pulse 81   Temp 97.8 F (36.6 C) (Oral)   Resp 16   Ht 5' 6"  (1.676 m)   Wt 161 lb 8 oz (73.3 kg)   SpO2 97%   BMI 26.07 kg/m    Subjective:    Patient ID: Andres Mullins, male    DOB: 1950-12-01, 65 y.o.   MRN: [id number]  HPI: Andres Mullins is a 65 y.o. male  Chief Complaint  Patient presents with  . Follow-up    Surgical clerance    Patient is here for pre-operative clearance; he is going to have cardiac clearance taken care of by Dr. Saunders Revel He is having left shoulder arthroscopy General anesthesia Hx of general anesthesia in the past, and no problems No fam hx of problems to anesthesia  No hx of free bleeding He will stop the aspirin one week prior; will stop the ibuprofen a few days prior; no fish oil; okay for vitamin D  No fevers No active infections other than getting over head cold; no boils No hx of MRSA No bladder infection No hx of recent antibiotics in the last 2 months  No SHOB, no wheezing; no airways No problems with airway; no dentures Cardiac by Dr. Saunders Revel  Same day surgery Does not want flu shot Surgery is Monday  Celiac disease; he has not seen GI to have any type of scoping; he wants to try to stay away from all the gluten hsCRP  We reviewed his lipid panel, NMR profile; he is not interested in statins  He continues to smoke, but does not sound ready to quit  Depression screen [facility] 2/9 10/17/2016 10/05/2016  Decreased Interest 0 0  Down, Depressed, Hopeless 0 0  PHQ - 2 Score 0 0   Relevant past medical, surgical, family and social history reviewed Past Medical History:  Diagnosis Date  . Atherosclerosis of coronary artery 09/28/2016   Chest CT Nov 2017  . Elevated C-reactive protein (CRP)   . Elevated PSA   . Fractured sternum 1999  . Herpes   . History of rib fracture 1999   multiple  . Hyperlipidemia   . Psoriasis   . Rheumatoid factor positive  April 2015  . Rosacea   . Thoracic aortic atherosclerosis ([place]) 09/28/2016   Chest CT Nov 2017  . Tobacco abuse   . Vitamin D deficiency disease    Past Surgical History:  Procedure Laterality Date  . APPENDECTOMY  1974  . TIBIA FRACTURE SURGERY Left 2004   titanium rod, ORIF   Family History  Problem Relation Age of Onset  . Heart disease Father   . Osteoarthritis Father   . Alzheimer's disease Father   . Cancer Neg Hx   . Diabetes Neg Hx   . Hypertension Neg Hx   . Stroke Neg Hx   . COPD Neg Hx    Social History  Substance Use Topics  . Smoking status: Current Every Day Smoker    Packs/day: 0.25    Years: 44.00    Types: Cigarettes  . Smokeless tobacco: Never Used  . Alcohol use 1.8 oz/week    3 Glasses of wine per week     Comment: a couple glasses of wine a week   Interim medical history since last visit reviewed. Allergies and medications reviewed  Review of Systems Per HPI unless specifically indicated above     Objective:  BP 118/80 (BP Location: Left Arm, Patient Position: Sitting, Cuff Size: Normal)   Pulse 81   Temp 97.8 F (36.6 C) (Oral)   Resp 16   Ht 5' 6"  (1.676 m)   Wt 161 lb 8 oz (73.3 kg)   SpO2 97%   BMI 26.07 kg/m   Wt Readings from Last 3 Encounters:  10/18/16 160 lb 8 oz (72.8 kg)  10/17/16 161 lb 8 oz (73.3 kg)  10/16/16 161 lb (73 kg)    Physical Exam  Constitutional: He appears well-developed and well-nourished. No distress.  HENT:  Head: Normocephalic and atraumatic.  Eyes: EOM are normal. No scleral icterus.  Neck: No thyromegaly present.  Cardiovascular: Normal rate and regular rhythm.   Pulmonary/Chest: Effort normal and breath sounds normal. No accessory muscle usage. No respiratory distress. He has no decreased breath sounds. He has no wheezes. He has no rhonchi. He has no rales.  Occasional cough; no wheezes, no rhonchi  Abdominal: Soft. Bowel sounds are normal. He exhibits no distension.  Musculoskeletal: He  exhibits no edema.  Lymphadenopathy:    He has no cervical adenopathy.  Neurological: Coordination normal.  Skin: Skin is warm and dry. No pallor.  Psychiatric: He has a normal mood and affect. His behavior is normal. Judgment and thought content normal. His mood appears not anxious. He does not exhibit a depressed mood.   Results for orders placed or performed in visit on 10/06/16  CRP High sensitivity  Result Value Ref Range   CRP, High Sensitivity 9.1 (H) mg/L  COMPLETE METABOLIC PANEL WITH GFR  Result Value Ref Range   Sodium 139 135 - 146 mmol/L   Potassium 4.5 3.5 - 5.3 mmol/L   Chloride 106 98 - 110 mmol/L   CO2 28 20 - 31 mmol/L   Glucose, Bld 80 65 - 99 mg/dL   BUN 20 7 - 25 mg/dL   Creat 0.81 0.70 - 1.25 mg/dL   Total Bilirubin 0.3 0.2 - 1.2 mg/dL   Alkaline Phosphatase 72 40 - 115 U/L   AST 14 10 - 35 U/L   ALT 17 9 - 46 U/L   Total Protein 6.3 6.1 - 8.1 g/dL   Albumin 4.0 3.6 - 5.1 g/dL   Calcium 9.3 8.6 - 10.3 mg/dL   GFR, Est African American >89 >=60 mL/min   GFR, Est Non African American >89 >=60 mL/min  Celiac Disease Comprehensive Panel with Reflexes  Result Value Ref Range   IgA 95 81 - 463 mg/dL   Tissue Transglutaminase Ab, IgA 1 <4 U/mL  Cardio IQ Adv Lipid and Inflamm Pnl  Result Value Ref Range   LDL Particle Number 1,670 1,016 - 2,185 nmol/L   LDL Small 275 123 - 441 nmol/L   LDL Medium 456 167 - 465 nmol/L   LDL Large 4,491 4,334 - 10,815 nmol/L   LDL Pattern B (A) A Pattern   LDL Peak Size 218.1 (L) >=218.2 Angstrom   Cholesterol, Total 207 (H) <200 mg/dL   HDL Cholesterol 46 >40 mg/dL   Triglycerides 112 <150 mg/dL   Lipoprotein (a) <10 <75 nmol/L   hs-CRP 9.4 (H) mg/L   Apolipoprotein B 104 52 - 109 mg/dL   LDL, Calculated 138 (H) <100 mg/dL   Cholesterol/HDL Ratio 4.5 <5.0 calc   Non-HDL Cholesterol 161 (H) <130 mg/dL (calc)   Lp-PLA2 Activity 122 70 - 153 nmol/min/mL      Assessment & Plan:   Problem List Items Addressed This Visit  None       Follow up plan: No Follow-up on file.  An after-visit summary was printed and given to the patient at [place].  Please see the patient instructions which may contain other information and recommendations beyond what is mentioned above in the assessment and plan.  Meds ordered this encounter  Medications  . benzonatate (TESSALON PERLES) 100 MG capsule    Sig: Take 1 capsule (100 mg total) by mouth every 8 (eight) hours as needed for cough.    Dispense:  30 capsule    Refill:  0    No orders of the defined types were placed in this encounter.

## 2016-10-17 NOTE — Patient Instructions (Addendum)
I will clear you medically for your surgery If your cough worsens, do seek medical attention over long holiday Use the cough medicine if needed Do talk with Dr. Saunders Revel about particle size Consider a vegetarian or vegan diet, as a more plant-based diet might help you control your cholesterol and help with inflammation

## 2016-10-18 ENCOUNTER — Ambulatory Visit (INDEPENDENT_AMBULATORY_CARE_PROVIDER_SITE_OTHER): Payer: BC Managed Care – PPO | Admitting: Internal Medicine

## 2016-10-18 ENCOUNTER — Encounter: Payer: Self-pay | Admitting: *Deleted

## 2016-10-18 ENCOUNTER — Telehealth: Payer: Self-pay | Admitting: *Deleted

## 2016-10-18 ENCOUNTER — Encounter: Payer: Self-pay | Admitting: Internal Medicine

## 2016-10-18 VITALS — BP 100/78 | HR 69 | Ht 66.0 in | Wt 160.5 lb

## 2016-10-18 DIAGNOSIS — E785 Hyperlipidemia, unspecified: Secondary | ICD-10-CM | POA: Diagnosis not present

## 2016-10-18 DIAGNOSIS — Z01810 Encounter for preprocedural cardiovascular examination: Secondary | ICD-10-CM | POA: Diagnosis not present

## 2016-10-18 DIAGNOSIS — I471 Supraventricular tachycardia: Secondary | ICD-10-CM | POA: Diagnosis not present

## 2016-10-18 DIAGNOSIS — I4719 Other supraventricular tachycardia: Secondary | ICD-10-CM

## 2016-10-18 NOTE — Telephone Encounter (Signed)
Received incoming call from [place] requesting clearance for patient for upcoming shoulder arthroscopy on 10/18/16. Spoke with Dr End. He verbally gave the ok for patient to be cleared at low-cardiovascular risk and may hold aspirin at the direction of Dr Sabra Heck.  Dr End saw patient in the office today and will include clearance in his office visit note.

## 2016-10-18 NOTE — Progress Notes (Signed)
Follow-up Outpatient Visit Date: 10/18/2016  Chief Complaint: Follow-up atrial tachycardia  HPI:  Andres Mullins is a 65 y.o. year-old male with history of hyperlipidemia and tobacco use, who presents for follow-up of incidentally discovered atrial tachycardia. I first met Andres Mullins last week as a new consultation for coronary artery calcification noted on lung cancer screening CT. At that time he was asymptomatic. However, his EKG demonstrated narrow complex tachycardia (heart rate 107 bpm), most consistent with an atrial tachycardia. We started metoprolol tartrate 25 mg twice a day, which the patient began taking about 3 days ago. Since that time, he has felt well with a noticeable decrease in his heart rate. He denies lightheadedness, shortness of breath, chest pain, and palpitations. Today, he reports very mild weakness involving his thighs and shoulders. However, should be noted that he is just getting over an upper respiratory tract infection that began last week.  Andres Mullins is still planning to undergo left shoulder surgery next week. His left shoulder pain is not significantly changed.  --------------------------------------------------------------------------------------------------  Cardiovascular History & Procedures: Cardiovascular Problems:  Coronary artery calcium by CT  Risk Factors:  Hyperlipidemia, age, male gender, and coronary artery calcium  Cath/PCI:  None  CV Surgery:  None  EP Procedures and Devices:  None  Non-Invasive Evaluation(s):  None  Recent CV Pertinent Labs: Lab Results  Component Value Date   CHOL 207 (H) 10/06/2016   HDL 46 10/06/2016   LDLCALC 138 (H) 10/06/2016   TRIG 112 10/06/2016   CHOLHDL 4.5 10/06/2016   K 4.5 10/06/2016   BUN 20 10/06/2016   CREATININE 0.81 10/06/2016    Past medical and surgical history were reviewed and updated in EPIC.   Outpatient Encounter Prescriptions as of 10/18/2016  Medication Sig  . benzonatate  (TESSALON PERLES) 100 MG capsule Take 1 capsule (100 mg total) by mouth every 8 (eight) hours as needed for cough.  . cholecalciferol (VITAMIN D) 1000 UNITS tablet Take 1,000 Units by mouth daily.  . meloxicam (MOBIC) 15 MG tablet TAKE 1 TABLET(S) EVERY DAY BY ORAL ROUTE WITH MEALS.  . metoprolol tartrate (LOPRESSOR) 25 MG tablet Take 1 tablet (25 mg total) by mouth 2 (two) times daily.  Marland Kitchen aspirin EC 81 MG tablet Take 1 tablet (81 mg total) by mouth daily.  . cyclobenzaprine (FLEXERIL) 10 MG tablet Take 10 mg by mouth 3 (three) times daily as needed.    No facility-administered encounter medications on file as of 10/18/2016.     Allergies: Patient has no known allergies.  Social History   Social History  . Marital status: Married    Spouse name: N/A  . Number of children: N/A  . Years of education: N/A   Occupational History  . Not on file.   Social History Main Topics  . Smoking status: Current Every Day Smoker    Packs/day: 0.25    Years: 44.00    Types: Cigarettes  . Smokeless tobacco: Never Used  . Alcohol use 1.8 oz/week    3 Glasses of wine per week     Comment: a couple glasses of wine a week  . Drug use: No  . Sexual activity: Not on file   Other Topics Concern  . Not on file   Social History Narrative   Consumes ~4 cups of coffee/day    Family History  Problem Relation Age of Onset  . Heart disease Father   . Osteoarthritis Father   . Alzheimer's disease Father   .  Cancer Neg Hx   . Diabetes Neg Hx   . Hypertension Neg Hx   . Stroke Neg Hx   . COPD Neg Hx    Review of Systems: Review of Systems  Respiratory: Positive for cough (Improving).   Cardiovascular: Negative.   Gastrointestinal: Negative.   Musculoskeletal: Positive for joint pain (Left shoulder).  Neurological: Positive for weakness (Proximal arms and legs today).   --------------------------------------------------------------------------------------------------  Physical Exam: BP  100/78 (BP Location: Left Arm, Patient Position: Sitting, Cuff Size: Normal)   Pulse 69   Ht 5' 6"  (1.676 m)   Wt 160 lb 8 oz (72.8 kg)   BMI 25.91 kg/m   General:  Well-developed, well-nourished man, seated comfortably in the exam room Lungs: Normal work of breathing.  Clear to auscultation bilaterally without wheezes or crackles. Heart: Regular rate and rhythm without murmurs, rubs, or gallops.  Non-displaced PMI. Abd: Bowel sounds present.  Soft, NT/ND without hepatosplenomegaly Ext: No lower extremity edema.  EKG:  Normal sinus rhythm and left axis deviation. Compared with previous tracing, atrial tachycardia is no longer present.  Lab Results  Component Value Date   WBC 14.8 (H) 09/12/2016   HGB 15.7 09/12/2016   HCT 46.1 09/12/2016   MCV 90.1 09/12/2016   PLT 240 09/12/2016    Lab Results  Component Value Date   NA 139 10/06/2016   K 4.5 10/06/2016   CL 106 10/06/2016   CO2 28 10/06/2016   BUN 20 10/06/2016   CREATININE 0.81 10/06/2016   GLUCOSE 80 10/06/2016   ALT 17 10/06/2016    Lab Results  Component Value Date   CHOL 207 (H) 10/06/2016   HDL 46 10/06/2016   LDLCALC 138 (H) 10/06/2016   TRIG 112 10/06/2016   CHOLHDL 4.5 10/06/2016   --------------------------------------------------------------------------------------------------  ASSESSMENT AND PLAN: Atrial tachycardia This has resolved, with EKG today demonstrating normal sinus rhythm. The patient is tolerating metoprolol well. His very mild proximal muscle weakness that he notes today is nonspecific. I am not convinced that this is related to starting metoprolol, as it could be a sequela of recent viral infection. We will continue with metoprolol at the current dose, though if his weakness does not improve, he can try cutting it in half (12.5 mg BID). We will proceed with transthoracic echocardiogram, as previously arranged next month.  Preoperative cardiovascular evaluation The patient does not have  any symptoms to suggest unstable coronary artery disease or decompensated heart failure. He is overall low risk for perioperative cardiovascular complication surrounding left shoulder surgery. If possible, he should be continued on metoprolol in the perioperative period. Aspirin can be discontinued at his surgeon's discretion and restarted when it is felt safe to do so from a postoperative bleeding standpoint.  Hyperlipidemia Patient is still hesitant to begin statin therapy. We will plan to repeat a lipid panel when he returns for follow-up in about 3 months after he has had an opportunity to try lifestyle modification.  Follow-up: Return to clinic in 3 months.  [name], MD 10/18/2016 1:38 PM

## 2016-10-18 NOTE — Telephone Encounter (Signed)
Returned call to preop nurse, letter generated in EPIC with clearance and she verbalized understanding.  Called patient to notify him. No answer on mobile number. Left detail message with clearance and recommendations, ok per DPR, and to call back if any questions.

## 2016-10-18 NOTE — Patient Instructions (Signed)
Medication Instructions:  Continue current medications.   Follow-Up: Your physician recommends that you schedule a follow-up appointment in: 3 MONTHS WITH DR END.   If you need a refill on your cardiac medications before your next appointment, please call your pharmacy.

## 2016-10-18 NOTE — Pre-Procedure Instructions (Addendum)
CLEARED BY DR M LADA 10/17/16 AND SEEING CARDIOLOGIST TODAY.  DR END CLEARED AND LETTER ON CHART

## 2016-10-22 DIAGNOSIS — M7592 Shoulder lesion, unspecified, left shoulder: Secondary | ICD-10-CM | POA: Insufficient documentation

## 2016-10-22 NOTE — Assessment & Plan Note (Signed)
Reviewed the lipid profile with him; he is not interested in a statin; wishes to try diet; aspirin when ortho says okay to resume

## 2016-10-22 NOTE — Assessment & Plan Note (Signed)
Plans for surgery; see MRI report, reviewed; other than his cough, which should resolve, I can clear him medically; however, if cough worsens, fever develops, etc, then go to urgent care or ER over long holiday weekend; cardiac clearance thought cardiologist

## 2016-10-23 ENCOUNTER — Ambulatory Visit: Payer: BC Managed Care – PPO | Admitting: Anesthesiology

## 2016-10-23 ENCOUNTER — Ambulatory Visit
Admission: RE | Admit: 2016-10-23 | Discharge: 2016-10-23 | Disposition: A | Discharge status: Home / Self Care | Payer: BC Managed Care – PPO | Source: Ambulatory Visit | Attending: Specialist | Admitting: Specialist

## 2016-10-23 ENCOUNTER — Encounter
Admission: RE | Disposition: A | Discharge status: Home / Self Care | Payer: Self-pay | Source: Ambulatory Visit | Attending: Specialist

## 2016-10-23 DIAGNOSIS — M7542 Impingement syndrome of left shoulder: Secondary | ICD-10-CM | POA: Insufficient documentation

## 2016-10-23 DIAGNOSIS — I739 Peripheral vascular disease, unspecified: Secondary | ICD-10-CM | POA: Diagnosis not present

## 2016-10-23 DIAGNOSIS — F17210 Nicotine dependence, cigarettes, uncomplicated: Secondary | ICD-10-CM | POA: Diagnosis not present

## 2016-10-23 DIAGNOSIS — Z7982 Long term (current) use of aspirin: Secondary | ICD-10-CM | POA: Diagnosis not present

## 2016-10-23 DIAGNOSIS — M19012 Primary osteoarthritis, left shoulder: Secondary | ICD-10-CM | POA: Insufficient documentation

## 2016-10-23 DIAGNOSIS — I2510 Atherosclerotic heart disease of native coronary artery without angina pectoris: Secondary | ICD-10-CM | POA: Insufficient documentation

## 2016-10-23 DIAGNOSIS — M7552 Bursitis of left shoulder: Secondary | ICD-10-CM | POA: Insufficient documentation

## 2016-10-23 HISTORY — PX: SHOULDER ARTHROSCOPY: SHX128

## 2016-10-23 SURGERY — ARTHROSCOPY, SHOULDER
Anesthesia: General | Laterality: Left

## 2016-10-23 MED ORDER — MIDAZOLAM HCL 5 MG/5ML IJ SOLN
1.0000 mg | Freq: Once | INTRAMUSCULAR | Status: AC
  Administered 2016-10-23: 1 mg via INTRAVENOUS

## 2016-10-23 MED ORDER — MORPHINE SULFATE (PF) 4 MG/ML IV SOLN
INTRAVENOUS | Status: AC
  Filled 2016-10-23: qty 1

## 2016-10-23 MED ORDER — MELOXICAM 7.5 MG PO TABS
ORAL_TABLET | ORAL | Status: AC
  Administered 2016-10-23: 15 mg via ORAL
  Filled 2016-10-23: qty 2

## 2016-10-23 MED ORDER — NEOMYCIN-POLYMYXIN B GU 40-200000 IR SOLN
Status: AC
  Filled 2016-10-23: qty 1

## 2016-10-23 MED ORDER — CEFAZOLIN SODIUM-DEXTROSE 2-3 GM-% IV SOLR
INTRAVENOUS | Status: DC | PRN
  Administered 2016-10-23: 2 g via INTRAVENOUS

## 2016-10-23 MED ORDER — ONDANSETRON HCL 4 MG/2ML IJ SOLN
INTRAMUSCULAR | Status: DC | PRN
  Administered 2016-10-23: 4 mg via INTRAVENOUS

## 2016-10-23 MED ORDER — FENTANYL CITRATE (PF) 100 MCG/2ML IJ SOLN
50.0000 ug | Freq: Once | INTRAMUSCULAR | Status: AC
  Administered 2016-10-23: 50 ug via INTRAVENOUS

## 2016-10-23 MED ORDER — MIDAZOLAM HCL 5 MG/5ML IJ SOLN
INTRAMUSCULAR | Status: AC
  Administered 2016-10-23: 1 mg via INTRAVENOUS
  Filled 2016-10-23: qty 5

## 2016-10-23 MED ORDER — EPINEPHRINE PF 1 MG/ML IJ SOLN
INTRAMUSCULAR | Status: DC | PRN
  Administered 2016-10-23: 6 mg

## 2016-10-23 MED ORDER — GABAPENTIN 400 MG PO CAPS
ORAL_CAPSULE | ORAL | Status: AC
  Filled 2016-10-23: qty 1

## 2016-10-23 MED ORDER — GABAPENTIN 400 MG PO CAPS: 400.0000 mg | ORAL_CAPSULE | Freq: Two times a day (BID) | ORAL | 3 refills | Status: DC

## 2016-10-23 MED ORDER — CEFAZOLIN SODIUM-DEXTROSE 2-4 GM/100ML-% IV SOLN
INTRAVENOUS | Status: AC
  Filled 2016-10-23: qty 100

## 2016-10-23 MED ORDER — MELOXICAM 7.5 MG PO TABS: 15.0000 mg | ORAL_TABLET | Freq: Every day | ORAL | 1 refills | Status: DC

## 2016-10-23 MED ORDER — MORPHINE SULFATE (PF) 4 MG/ML IV SOLN
INTRAVENOUS | Status: DC | PRN
  Administered 2016-10-23: 4 mg via INTRAMUSCULAR

## 2016-10-23 MED ORDER — ONDANSETRON HCL 4 MG/2ML IJ SOLN: 4.0000 mg | Freq: Once | INTRAMUSCULAR | Status: DC | PRN

## 2016-10-23 MED ORDER — FENTANYL CITRATE (PF) 100 MCG/2ML IJ SOLN
INTRAMUSCULAR | Status: AC
  Administered 2016-10-23: 50 ug via INTRAVENOUS
  Filled 2016-10-23: qty 2

## 2016-10-23 MED ORDER — HYDROCODONE-ACETAMINOPHEN 7.5-325 MG PO TABS: 1.0000 | ORAL_TABLET | Freq: Four times a day (QID) | ORAL | 0 refills | Status: DC | PRN

## 2016-10-23 MED ORDER — BUPIVACAINE HCL (PF) 0.25 % IJ SOLN
INTRAMUSCULAR | Status: AC
  Filled 2016-10-23: qty 30

## 2016-10-23 MED ORDER — PHENYLEPHRINE HCL 10 MG/ML IJ SOLN
INTRAMUSCULAR | Status: DC | PRN
  Administered 2016-10-23: 50 ug via INTRAVENOUS

## 2016-10-23 MED ORDER — FENTANYL CITRATE (PF) 100 MCG/2ML IJ SOLN
INTRAMUSCULAR | Status: DC | PRN
  Administered 2016-10-23: 100 ug via INTRAVENOUS

## 2016-10-23 MED ORDER — CEFAZOLIN SODIUM-DEXTROSE 2-4 GM/100ML-% IV SOLN: 2.0000 g | INTRAVENOUS | Status: DC

## 2016-10-23 MED ORDER — BUPIVACAINE-EPINEPHRINE (PF) 0.25% -1:200000 IJ SOLN
INTRAMUSCULAR | Status: DC | PRN
  Administered 2016-10-23: 30 mL
  Administered 2016-10-23: 20 mL

## 2016-10-23 MED ORDER — PROPOFOL 10 MG/ML IV BOLUS
INTRAVENOUS | Status: DC | PRN
  Administered 2016-10-23: 150 mg via INTRAVENOUS

## 2016-10-23 MED ORDER — CLINDAMYCIN PHOSPHATE 600 MG/50ML IV SOLN
INTRAVENOUS | Status: AC
  Filled 2016-10-23: qty 50

## 2016-10-23 MED ORDER — CLINDAMYCIN PHOSPHATE 600 MG/50ML IV SOLN
600.0000 mg | Freq: Once | INTRAVENOUS | Status: AC
  Administered 2016-10-23: 600 mg via INTRAVENOUS

## 2016-10-23 MED ORDER — FAMOTIDINE 20 MG PO TABS
20.0000 mg | ORAL_TABLET | Freq: Once | ORAL | Status: AC
  Administered 2016-10-23: 20 mg via ORAL

## 2016-10-23 MED ORDER — FAMOTIDINE 20 MG PO TABS
ORAL_TABLET | ORAL | Status: AC
  Filled 2016-10-23: qty 1

## 2016-10-23 MED ORDER — LACTATED RINGERS IV SOLN
INTRAVENOUS | Status: DC
  Administered 2016-10-23: 13:00:00 via INTRAVENOUS

## 2016-10-23 MED ORDER — CHLORHEXIDINE GLUCONATE CLOTH 2 % EX PADS: 6.0000 | MEDICATED_PAD | Freq: Once | CUTANEOUS | Status: DC

## 2016-10-23 MED ORDER — MIDAZOLAM HCL 2 MG/2ML IJ SOLN
1.0000 mg | Freq: Once | INTRAMUSCULAR | Status: AC
  Administered 2016-10-23: 1 mg via INTRAVENOUS

## 2016-10-23 MED ORDER — EPINEPHRINE 30 MG/30ML IJ SOLN
INTRAMUSCULAR | Status: AC
  Filled 2016-10-23: qty 1

## 2016-10-23 MED ORDER — SUGAMMADEX SODIUM 200 MG/2ML IV SOLN
INTRAVENOUS | Status: DC | PRN
  Administered 2016-10-23: 140 mg via INTRAVENOUS

## 2016-10-23 MED ORDER — GABAPENTIN 400 MG PO CAPS
400.0000 mg | ORAL_CAPSULE | Freq: Once | ORAL | Status: AC
  Administered 2016-10-23: 400 mg via ORAL

## 2016-10-23 MED ORDER — ROCURONIUM BROMIDE 100 MG/10ML IV SOLN
INTRAVENOUS | Status: DC | PRN
  Administered 2016-10-23: 40 mg via INTRAVENOUS

## 2016-10-23 MED ORDER — MELOXICAM 7.5 MG PO TABS
15.0000 mg | ORAL_TABLET | Freq: Once | ORAL | Status: AC
  Administered 2016-10-23: 15 mg via ORAL

## 2016-10-23 MED ORDER — LIDOCAINE HCL (CARDIAC) 20 MG/ML IV SOLN
INTRAVENOUS | Status: DC | PRN
  Administered 2016-10-23: 30 mg via INTRAVENOUS

## 2016-10-23 MED ORDER — FENTANYL CITRATE (PF) 100 MCG/2ML IJ SOLN: 25.0000 ug | INTRAMUSCULAR | Status: DC | PRN

## 2016-10-23 SURGICAL SUPPLY — 50 items
ADAPTER IRRIG TUBE 2 SPIKE SOL (ADAPTER) ×3 IMPLANT
BLADE AGGRESSIVE PLUS 4.0 (BLADE) ×3 IMPLANT
BUR AGGRESSIVE+ 5.5 (BURR) IMPLANT
BUR BR 5.5 12 FLUTE (BURR) ×3 IMPLANT
BUR RADIUS 4.0X18.5 (BURR) IMPLANT
BUR RADIUS 5.5 (BURR) IMPLANT
CANNULA 5.75X7 CRYSTAL CLEAR (CANNULA) ×3 IMPLANT
CANNULA 8.5X75 THRED (CANNULA) ×3 IMPLANT
CANNULA PARTIAL THREAD 2X7 (CANNULA) ×3 IMPLANT
CHLORAPREP W/TINT 26ML (MISCELLANEOUS) ×3 IMPLANT
CONNECTOR PERFECT PASSER (CONNECTOR) IMPLANT
COVER MAYO STAND STRL (DRAPES) ×3 IMPLANT
DRAPE IMP U-DRAPE 54X76 (DRAPES) ×3 IMPLANT
DRAPE SHEET LG 3/4 BI-LAMINATE (DRAPES) ×3 IMPLANT
DRAPE STERI 35X30 U-POUCH (DRAPES) ×3 IMPLANT
GAUZE PETRO XEROFOAM 1X8 (MISCELLANEOUS) ×3 IMPLANT
GAUZE SPONGE 4X4 12PLY STRL (GAUZE/BANDAGES/DRESSINGS) ×3 IMPLANT
GLOVE SURG ORTHO 8.0 STRL STRW (GLOVE) ×3 IMPLANT
GOWN STRL REUS W/ TWL LRG LVL4 (GOWN DISPOSABLE) ×2 IMPLANT
GOWN STRL REUS W/TWL LRG LVL4 (GOWN DISPOSABLE) ×1
IV LACTATED RINGER IRRG 3000ML (IV SOLUTION) ×6
IV LR IRRIG 3000ML ARTHROMATIC (IV SOLUTION) ×12 IMPLANT
KIT RM TURNOVER STRD PROC AR (KITS) ×3 IMPLANT
KIT SHOULDER TRACTION (DRAPES) ×3 IMPLANT
MANIFOLD NEPTUNE II (INSTRUMENTS) ×3 IMPLANT
MAT BLUE FLOOR 46X72 FLO (MISCELLANEOUS) IMPLANT
NDL SAFETY 18GX1.5 (NEEDLE) ×3 IMPLANT
NEEDLE SPNL 18GX3.5 QUINCKE PK (NEEDLE) ×3 IMPLANT
NS IRRIG 500ML POUR BTL (IV SOLUTION) ×3 IMPLANT
PACK ARTHROSCOPY SHOULDER (MISCELLANEOUS) ×3 IMPLANT
PASSER SUT CAPTURE FIRST (SUTURE) IMPLANT
SET TUBE SUCT SHAVER OUTFL 24K (TUBING) ×3 IMPLANT
SET TUBE TIP INTRA-ARTICULAR (MISCELLANEOUS) ×3 IMPLANT
SLING ULTRA II LG (MISCELLANEOUS) ×3 IMPLANT
SOL PREP PVP 2OZ (MISCELLANEOUS) ×3
SOLUTION PREP PVP 2OZ (MISCELLANEOUS) ×2 IMPLANT
SUT ETHILON 3 0 FSLX (SUTURE) ×3 IMPLANT
SUT PDS PLUS 0 (SUTURE) ×1
SUT PDS PLUS AB 0 CT-2 (SUTURE) ×2 IMPLANT
SUT PERFECTPASSER WHITE CART (SUTURE) IMPLANT
SUT SMART STITCH CARTRIDGE (SUTURE) IMPLANT
SUT VIC AB 2-0 CT2 27 (SUTURE) ×3 IMPLANT
SUT VICRYL 3-0 27IN (SUTURE) ×3 IMPLANT
SUTURE MAGNUM WIRE 2X48 BLK (SUTURE) IMPLANT
SYR 20CC LL (SYRINGE) ×3 IMPLANT
SYR 30ML LL (SYRINGE) ×3 IMPLANT
SYR 50ML LL SCALE MARK (SYRINGE) ×3 IMPLANT
TUBING ARTHRO INFLOW-ONLY STRL (TUBING) ×3 IMPLANT
TUBING CONNECTING 10 (TUBING) ×3 IMPLANT
WAND HAND CNTRL MULTIVAC 90 (MISCELLANEOUS) ×3 IMPLANT

## 2016-10-23 NOTE — Anesthesia Preprocedure Evaluation (Signed)
Anesthesia Evaluation  Patient identified by MRN, date of birth, ID band Patient awake    Reviewed: Allergy & Precautions, NPO status , Patient's Chart, lab work & pertinent test results, reviewed documented beta blocker date and time   Airway Mallampati: II  TM Distance: >3 FB     Dental  (+) Chipped   Pulmonary Current Smoker,           Cardiovascular + CAD and + Peripheral Vascular Disease       Neuro/Psych    GI/Hepatic   Endo/Other    Renal/GU      Musculoskeletal  (+) Arthritis ,   Abdominal   Peds  Hematology   Anesthesia Other Findings Hx of tachy, on metoprolol. Celiac disease.  Reproductive/Obstetrics                             Anesthesia Physical Anesthesia Plan  ASA: III  Anesthesia Plan: General   Post-op Pain Management:    Induction: Intravenous  Airway Management Planned: Oral ETT  Additional Equipment:   Intra-op Plan:   Post-operative Plan:   Informed Consent: I have reviewed the patients History and Physical, chart, labs and discussed the procedure including the risks, benefits and alternatives for the proposed anesthesia with the patient or authorized representative who has indicated his/her understanding and acceptance.     Plan Discussed with: CRNA  Anesthesia Plan Comments:         Anesthesia Quick Evaluation

## 2016-10-23 NOTE — Anesthesia Postprocedure Evaluation (Signed)
Anesthesia Post Note  Patient: Andres Mullins  Procedure(s) Performed: Procedure(s) (LRB): SHOULDER ARTHROSCOPY WITH OPEN ROTATOR CUFF REPAIR (Left)  Patient location during evaluation: PACU Anesthesia Type: General Level of consciousness: awake and alert and oriented Pain management: pain level controlled Vital Signs Assessment: post-procedure vital signs reviewed and stable Respiratory status: spontaneous breathing Cardiovascular status: blood pressure returned to baseline Anesthetic complications: no    Last Vitals:  Vitals:   10/23/16 1734 10/23/16 1742  BP: 117/79 125/74  Pulse: 82 80  Resp: 12 14  Temp: 36.7 C (!) 35.9 C    Last Pain:  Vitals:   10/23/16 1742  TempSrc: Temporal  PainSc:                  [name]

## 2016-10-23 NOTE — Op Note (Signed)
10/23/2016  4:31 PM  PATIENT:  Andres Mullins    PRE-OPERATIVE DIAGNOSIS:  Impingement syndrome of left shoulder  POST-OPERATIVE DIAGNOSIS:  Same  PROCEDURE:  SHOULDER ARTHROSCOPY WITH DISTAL CLAVICLE EXCISION, PARTIAL ACROMIONECTOMY, AND DEBRIDEMENT  SURGEON:  Park Breed, MD   ASST:  ALEX MELOY, PAC  ANESTHESIA:   General and interscalene block  PREOPERATIVE INDICATIONS:  [name] is a  65 y.o. male with a diagnosis of Impingement syndrome of left shoulder who failed conservative measures and elected for surgical management.    The risks benefits and alternatives were discussed with the patient preoperatively including but not limited to the risks of infection, bleeding, nerve injury, cardiopulmonary complications, the need for revision surgery, among others, and the patient was willing to proceed.  EBL: MINIMAL   OPERATIVE FINDINGS:  Severe AC arthritis and spurring, severe bursitis, intact biceps tendon, intact rotator cuff with mild bursal side fraying  OPERATIVE PROCEDURE: The patient was brought to the operating room where satisfactory general endotracheal and interscalene anesthesia were accomplished.The patient was turned into the lateral decubitus position and the shoulder was prepped and draped in a sterile fashion. Arthroscopy was carried out from a posterior portal with accessory portals laterally and anteriorly. The  joint was examined first. The above findings were encountered. The motorized shaver was introduced anteriorly and the undersurface of the rotator cuff probed and lightly debrided. The biceps tendon was left alone. The labrum was trimmed up with the ArthroCare wand. The arthroscope was redirected into the subacromial space. There was severe bursitis which was resected with the motorized shaver and ArthroCare wand. The large anterior bur was introduced from a posterior portal and the anterior acromion was debrided. The undersurface of the clavicle was  debrided with the bur which was then reintroduced from an anterior portal and the remaining distal clavicle completely excised.   Final debridement was carried out with the motorized shaver and the ArthroCare wand. The joint was flushed and the stab wounds and closed with 3-0 nylon suture. 1/4% Marcaine with morphine was injected into the shoulder. Sponge and needle counts were correct.   The dry sterile dressing and was applied along with a  padded sling. Patient was awakened and taken recovery in good condition.  Park Breed, MD

## 2016-10-23 NOTE — Transfer of Care (Signed)
Immediate Anesthesia Transfer of Care Note  Patient: Andres Mullins  Procedure(s) Performed: Procedure(s): SHOULDER ARTHROSCOPY WITH OPEN ROTATOR CUFF REPAIR (Left)  Patient Location: PACU  Anesthesia Type:General  Level of Consciousness: sedated and responds to stimulation  Airway & Oxygen Therapy: Patient Spontanous Breathing and Patient connected to face mask oxygen  Post-op Assessment: Report given to RN and Post -op Vital signs reviewed and stable  Post vital signs: Reviewed and stable  Last Vitals:  Vitals:   10/23/16 1447 10/23/16 1654  BP: 107/76 132/85  Pulse: 97 86  Resp: 20 20  Temp:  36.2 C    Last Pain:  Vitals:   10/23/16 1207  TempSrc: Tympanic  PainSc: 2          Complications: No apparent anesthesia complications

## 2016-10-23 NOTE — Progress Notes (Signed)
Instructions gone over with family member and states understanding

## 2016-10-23 NOTE — H&P (Signed)
THE PATIENT WAS SEEN PRIOR TO SURGERY TODAY.  HISTORY, ALLERGIES, HOME MEDICATIONS AND OPERATIVE PROCEDURE WERE REVIEWED. RISKS AND BENEFITS OF SURGERY DISCUSSED WITH PATIENT AGAIN.  NO CHANGES FROM INITIAL HISTORY AND PHYSICAL NOTED.    

## 2016-10-23 NOTE — Progress Notes (Signed)
Circulation positive to right fingers   Can wiggle fingers on right

## 2016-10-23 NOTE — Anesthesia Procedure Notes (Signed)
Procedure Name: Intubation Date/Time: 10/23/2016 3:05 PM Performed by: Jonna Clark Pre-anesthesia Checklist: Patient identified, Patient being monitored, Timeout performed, Emergency Drugs available and Suction available Patient Re-evaluated:Patient Re-evaluated prior to inductionOxygen Delivery Method: Circle system utilized Preoxygenation: Pre-oxygenation with 100% oxygen Intubation Type: IV induction Ventilation: Mask ventilation without difficulty Laryngoscope Size: Mac and 3 Grade View: Grade I Tube type: Oral Tube size: 7.5 mm Number of attempts: 1 Airway Equipment and Method: Stylet Placement Confirmation: ETT inserted through vocal cords under direct vision,  positive ETCO2 and breath sounds checked- equal and bilateral Secured at: 21 cm Tube secured with: Tape Dental Injury: Teeth and Oropharynx as per pre-operative assessment

## 2016-10-25 ENCOUNTER — Encounter: Payer: Self-pay | Admitting: Specialist

## 2016-11-03 ENCOUNTER — Ambulatory Visit (INDEPENDENT_AMBULATORY_CARE_PROVIDER_SITE_OTHER): Payer: BC Managed Care – PPO

## 2016-11-03 ENCOUNTER — Other Ambulatory Visit: Payer: Self-pay

## 2016-11-03 DIAGNOSIS — I471 Supraventricular tachycardia: Secondary | ICD-10-CM | POA: Diagnosis not present

## 2016-11-08 ENCOUNTER — Telehealth: Payer: Self-pay | Admitting: Internal Medicine

## 2016-11-08 NOTE — Telephone Encounter (Signed)
Patient wanting echocardiogram results.

## 2016-11-08 NOTE — Telephone Encounter (Signed)
No answer. Left message to call back.   

## 2016-11-09 NOTE — Telephone Encounter (Signed)
S/w patient. Gave him results of recent echo. He verbalized understanding.  3 month f/u appt scheduled with Dr End. Patient aware and was very appreciative.

## 2017-01-17 ENCOUNTER — Encounter: Payer: Self-pay | Admitting: Internal Medicine

## 2017-01-17 ENCOUNTER — Ambulatory Visit (INDEPENDENT_AMBULATORY_CARE_PROVIDER_SITE_OTHER): Payer: BC Managed Care – PPO | Admitting: Internal Medicine

## 2017-01-17 VITALS — BP 104/74 | HR 89 | Ht 66.0 in | Wt 164.5 lb

## 2017-01-17 DIAGNOSIS — I2510 Atherosclerotic heart disease of native coronary artery without angina pectoris: Secondary | ICD-10-CM | POA: Diagnosis not present

## 2017-01-17 DIAGNOSIS — I2584 Coronary atherosclerosis due to calcified coronary lesion: Secondary | ICD-10-CM | POA: Diagnosis not present

## 2017-01-17 DIAGNOSIS — I471 Supraventricular tachycardia: Secondary | ICD-10-CM | POA: Diagnosis not present

## 2017-01-17 MED ORDER — METOPROLOL TARTRATE 25 MG PO TABS: 25.0000 mg | ORAL_TABLET | Freq: Two times a day (BID) | ORAL | 3 refills | Status: DC | PRN

## 2017-01-17 NOTE — Patient Instructions (Signed)
Medication Instructions:  Your physician recommends that you continue on your current medications as directed. Please refer to the Current Medication list given to you today.   Labwork: none  Testing/Procedures: none  Follow-Up: Your physician wants you to follow-up in: 12 MONTHS WITH DR END.  You will receive a reminder letter in the mail two months in advance. If you don't receive a letter, please call our office to schedule the follow-up appointment.  If you need a refill on your cardiac medications before your next appointment, please call your pharmacy.

## 2017-01-17 NOTE — Progress Notes (Signed)
Follow-up Outpatient Visit Date: 01/17/2017  Chief Complaint: Follow-up atrial tachycardia  HPI:  [name] is a 66 y.o. year-old male with history of hyperlipidemia and tobacco use, who presents for follow-up of incidentally discovered atrial tachycardia and coronary artery calcification. Since our last visit in 09/2016, Mr. Viverette has felt well. His left should surgery and subsequent recovery were uneventful. He noted fatigue and "melancholy" with metoprolol and initially cut this in half and ultimately stopped. He has not had any palpitations and reports that his resting pulse has remained less than 90. He also denies chest pain, shortness of breath, edema, and lightheadedness. He has been able to cut his caffeine intake in half.  --------------------------------------------------------------------------------------------------  Cardiovascular History & Procedures: Cardiovascular Problems:  Coronary artery calcium by CT  Atrial tachycardia  Risk Factors:  Hyperlipidemia, age, male gender, and coronary artery calcium  Cath/PCI:  None  CV Surgery:  None  EP Procedures and Devices:  None  Non-Invasive Evaluation(s):  TTE (11/03/16): Normal LV size and function (EF 60-65%) with normal diastolic function. Trivial AI with upper normal aortic root size (3.5 cm at sinus of Valsalva). Normal RV size and function.  Recent CV Pertinent Labs: Lab Results  Component Value Date   CHOL 207 (H) 10/06/2016   HDL 46 10/06/2016   LDLCALC 138 (H) 10/06/2016   TRIG 112 10/06/2016   CHOLHDL 4.5 10/06/2016   K 4.5 10/06/2016   BUN 20 10/06/2016   CREATININE 0.81 10/06/2016    Past medical and surgical history were reviewed and updated in EPIC.   Outpatient Encounter Prescriptions as of 01/17/2017  Medication Sig  . aspirin EC 81 MG tablet Take 1 tablet (81 mg total) by mouth daily.  . metoprolol tartrate (LOPRESSOR) 25 MG tablet Take 1 tablet (25 mg total) by mouth 2 (two) times  daily. (Patient not taking: Reported on 01/17/2017)  . [DISCONTINUED] benzonatate (TESSALON PERLES) 100 MG capsule Take 1 capsule (100 mg total) by mouth every 8 (eight) hours as needed for cough. (Patient not taking: Reported on 01/17/2017)  . [DISCONTINUED] cholecalciferol (VITAMIN D) 1000 UNITS tablet Take 1,000 Units by mouth daily.  . [DISCONTINUED] cyclobenzaprine (FLEXERIL) 10 MG tablet Take 10 mg by mouth 3 (three) times daily as needed.   . [DISCONTINUED] gabapentin (NEURONTIN) 400 MG capsule Take 1 capsule (400 mg total) by mouth 2 (two) times daily. (Patient not taking: Reported on 01/17/2017)  . [DISCONTINUED] HYDROcodone-acetaminophen (NORCO) 7.5-325 MG tablet Take 1 tablet by mouth every 6 (six) hours as needed for moderate pain. (Patient not taking: Reported on 01/17/2017)  . [DISCONTINUED] meloxicam (MOBIC) 15 MG tablet TAKE 1 TABLET(S) EVERY DAY BY ORAL ROUTE WITH MEALS.  . [DISCONTINUED] meloxicam (MOBIC) 7.5 MG tablet Take 2 tablets (15 mg total) by mouth daily. Take daily for first 7-10 days then as needed for pain and swelling (Patient not taking: Reported on 01/17/2017)   No facility-administered encounter medications on file as of 01/17/2017.     Allergies: Patient has no known allergies.  Social History   Social History  . Marital status: Married    Spouse name: N/A  . Number of children: N/A  . Years of education: N/A   Occupational History  . Not on file.   Social History Main Topics  . Smoking status: Current Every Day Smoker    Packs/day: 0.25    Years: 44.00    Types: Cigarettes  . Smokeless tobacco: Never Used  . Alcohol use 1.8 oz/week    3  Glasses of wine per week     Comment: a couple glasses of wine a week  . Drug use: No  . Sexual activity: Not on file   Other Topics Concern  . Not on file   Social History Narrative   Consumes ~4 cups of coffee/day    Family History  Problem Relation Age of Onset  . Heart disease Father   . Osteoarthritis  Father   . Alzheimer's disease Father   . Cancer Neg Hx   . Diabetes Neg Hx   . Hypertension Neg Hx   . Stroke Neg Hx   . COPD Neg Hx    Review of Systems: Review of Systems  Constitutional: Negative.   Respiratory: Negative.   Cardiovascular: Negative.   Gastrointestinal: Negative.   Musculoskeletal: Negative.   Neurological: Negative.    --------------------------------------------------------------------------------------------------  Physical Exam: BP 104/74 (BP Location: Left Arm, Patient Position: Sitting, Cuff Size: Normal)   Pulse 89   Ht 5' 6"  (1.676 m)   Wt 164 lb 8 oz (74.6 kg)   BMI 26.55 kg/m   General:  Well-developed, well-nourished man, seated comfortably in the exam room HEENT: OP clear with MMM. No conjunctival pallor. Neck: Supple without JVD or HJR. Lungs: Normal work of breathing.  Clear to auscultation bilaterally without wheezes or crackles. Heart: Regular rate and rhythm without murmurs, rubs, or gallops.  Non-displaced PMI. Abd: Bowel sounds present.  Soft, NT/ND without hepatosplenomegaly Ext: No lower extremity edema. 2+ radial, PT, and DP pulses bilaterally  EKG:  Normal sinus rhythm with LAFB. No significant change from prior tracing on 10/18/16 (I have personally reviewed both tracings).  Lab Results  Component Value Date   WBC 14.8 (H) 09/12/2016   HGB 15.7 09/12/2016   HCT 46.1 09/12/2016   MCV 90.1 09/12/2016   PLT 240 09/12/2016    Lab Results  Component Value Date   NA 139 10/06/2016   K 4.5 10/06/2016   CL 106 10/06/2016   CO2 28 10/06/2016   BUN 20 10/06/2016   CREATININE 0.81 10/06/2016   GLUCOSE 80 10/06/2016   ALT 17 10/06/2016    Lab Results  Component Value Date   CHOL 207 (H) 10/06/2016   HDL 46 10/06/2016   LDLCALC 138 (H) 10/06/2016   TRIG 112 10/06/2016   CHOLHDL 4.5 10/06/2016   --------------------------------------------------------------------------------------------------  ASSESSMENT AND  PLAN: Atrial tachycardia Normal sinus rhythm today. No symptoms, though patient was asymptomatic at the time atrial tach was discovered. He denies resting heart rates above 90 bpm. He did not tolerate metoprolol well due to fatigue and "melancholy." We discussed using prn metoprolol if his resting heart rate increases or he has palpitations, trying a different beta-blocker, or switching to diltiazem. Mr. Rooke would like to try prn metoprolol.  Coronary artery calcifiction Incidentally noted on lung cancer screening chest CT. No symptoms of coronary insufficiency. LDL borderline in 09/2016; patient does not wish to consider statin therapy at this time. I encouraged continued lifestyle modifications, including smoking cessation.  Follow-up: Return to clinic in 1 year.  [name], MD 01/17/2017 11:10 AM

## 2017-01-18 ENCOUNTER — Encounter: Payer: Self-pay | Admitting: Internal Medicine

## 2017-01-18 DIAGNOSIS — I2510 Atherosclerotic heart disease of native coronary artery without angina pectoris: Secondary | ICD-10-CM | POA: Insufficient documentation

## 2017-01-18 DIAGNOSIS — I471 Supraventricular tachycardia: Secondary | ICD-10-CM | POA: Insufficient documentation

## 2017-01-18 DIAGNOSIS — I2584 Coronary atherosclerosis due to calcified coronary lesion: Secondary | ICD-10-CM

## 2017-04-27 ENCOUNTER — Encounter: Payer: Self-pay | Admitting: Family Medicine

## 2017-04-27 ENCOUNTER — Ambulatory Visit (INDEPENDENT_AMBULATORY_CARE_PROVIDER_SITE_OTHER): Payer: BC Managed Care – PPO | Admitting: Family Medicine

## 2017-04-27 DIAGNOSIS — H109 Unspecified conjunctivitis: Secondary | ICD-10-CM

## 2017-04-27 DIAGNOSIS — B009 Herpesviral infection, unspecified: Secondary | ICD-10-CM | POA: Diagnosis not present

## 2017-04-27 DIAGNOSIS — L719 Rosacea, unspecified: Secondary | ICD-10-CM

## 2017-04-27 MED ORDER — VALACYCLOVIR HCL 500 MG PO TABS: 500.0000 mg | ORAL_TABLET | Freq: Two times a day (BID) | ORAL | 3 refills | Status: DC | PRN

## 2017-04-27 MED ORDER — ERYTHROMYCIN 5 MG/GM OP OINT: 1.0000 "application " | TOPICAL_OINTMENT | Freq: Three times a day (TID) | OPHTHALMIC | 0 refills | Status: DC

## 2017-04-27 MED ORDER — DOXYCYCLINE HYCLATE 100 MG PO TABS: 100.0000 mg | ORAL_TABLET | Freq: Two times a day (BID) | ORAL | 5 refills | Status: DC

## 2017-04-27 NOTE — Patient Instructions (Addendum)

## 2017-04-27 NOTE — Progress Notes (Addendum)
Name: Andres Mullins   MRN: [id number]    DOB: August 18, 1951   Date:04/27/2017       Progress Note  Subjective  Chief Complaint  Chief Complaint  Patient presents with  . Conjunctivitis    itching for 1 day    HPI  Conjunctivitis: Pt presents with new onset RIGHT eye conjunctivitis that started this morning. He noted exudate from inner canthus this morning. No vision changes, no eye pain. Endorses eye itching. Notes his wife was diagnosed 2 days ago with bacterial conjunctivitis and is concerned that he has the same.  Rosacea: Has occasional flare and usually takes Doxycycline for this and requests a refill.  States Doxycycline works very well for him.   Herpes: Has not had any outbreaks for over a year. Takes Valtrex BID x5 days when he does have an outbreak and he requests a refill today.  Patient Active Problem List   Diagnosis Date Noted  . Atrial tachycardia ([place]) 01/18/2017  . Coronary artery calcification 01/18/2017  . Supraspinatus tendinitis, left 10/22/2016  . Celiac disease 10/05/2016  . Atherosclerosis of coronary artery 09/28/2016  . Thoracic aortic atherosclerosis ([place]) 09/28/2016  . Personal history of tobacco use, presenting hazards to health 09/26/2016  . 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 Tendonitis, Achilles, left 10/14/2015  . 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 Herpes   . Rosacea   . Psoriasis   . Hyperlipidemia   . Vitamin D deficiency disease   . Tobacco abuse   . Elevated PSA   . Elevated C-reactive protein (CRP)     Social History  Substance Use Topics  . Smoking status: Current Every Day Smoker    Packs/day: 0.25    Years: 44.00    Types: Cigarettes  . Smokeless tobacco: Never Used  . Alcohol use 1.8 oz/week    3 Glasses of wine per week     Comment: a couple glasses of wine a week     Current Outpatient Prescriptions:  .  aspirin EC 81 MG tablet, Take 1 tablet (81 mg total) by mouth daily., Disp: 30 tablet, Rfl: 11 .  meloxicam  (MOBIC) 7.5 MG tablet, meloxicam 7.5 mg tablet, Disp: , Rfl:  .  metoprolol tartrate (LOPRESSOR) 25 MG tablet, Take 1 tablet (25 mg total) by mouth 2 (two) times daily as needed (palpitations or tachycardia). (Patient not taking: Reported on 04/27/2017), Disp: 180 tablet, Rfl: 3  No Known Allergies  ROS  Constitutional: Negative for fever or weight change.  Respiratory: Negative for cough and shortness of breath.   Cardiovascular: Negative for chest pain or palpitations.  Gastrointestinal: Negative for abdominal pain, no bowel changes.  Musculoskeletal: Negative for gait problem or joint swelling.  Skin: Negative for rash.  Neurological: Negative for dizziness or headache.  No other specific complaints in a complete review of systems (except as listed in HPI above).  Objective  There were no vitals filed for this visit.  There is no height or weight on file to calculate BMI.  Nursing Note and Vital Signs reviewed.  Physical Exam  Constitutional: Patient appears well-developed and well-nourished. No distress.  HEENT: head atraumatic, normocephalic, pupils equal and reactive to light, EOM's intact, conjunctiva mildly erythematous, dried exudate noted along lashes, no orbital swelling, maxillary or frontal sinus pain on palpation, neck supple without lymphadenopathy, oropharynx pink and moist without exudate Cardiovascular: Normal rate, regular rhythm, S1/S2 present.  No murmur or rub heard. No BLE edema. Pulmonary/Chest: Effort normal and breath sounds clear.  No respiratory distress or retractions. Psychiatric: Patient has a normal mood and affect. behavior is normal. Judgment and thought content normal.  No results found for this or any previous visit (from the past 2160 hour(s)).   Assessment & Plan 1. Bacterial conjunctivitis of right eye  - erythromycin ophthalmic ointment; Place 1 application into the right eye 3 (three) times daily.  Dispense: 3.5 g; Refill: 0  2.  Rosacea  - doxycycline (VIBRA-TABS) 100 MG tablet; Take 1 tablet (100 mg total) by mouth 2 (two) times daily.  Dispense: 10 tablet; Refill: 5  3. Herpes  - valACYclovir (VALTREX) 500 MG tablet; Take 1 tablet (500 mg total) by mouth 2 (two) times daily as needed.  Dispense: 30 tablet; Refill: 3  -Red flags and when to present for emergency care or RTC including fever >101.44F, chest pain, shortness of breath, new/worsening/un-resolving symptoms, and/or vision changes reviewed with patient at time of visit. Follow up and care instructions discussed and provided in AVS. -Reviewed Health Maintenance: Follow up with Dr. Sanda Klein to be scheduled at checkout.  I have reviewed this encounter including the documentation in this note and/or discussed this patient with the Johney Maine, FNP, NP-C. I am certifying that I agree with the content of this note as supervising physician.  Steele Sizer, MD [facility] 05/06/2017, 10:42 AM

## 2017-05-18 ENCOUNTER — Encounter: Payer: Self-pay | Admitting: Family Medicine

## 2017-05-18 ENCOUNTER — Ambulatory Visit (INDEPENDENT_AMBULATORY_CARE_PROVIDER_SITE_OTHER): Payer: BC Managed Care – PPO | Admitting: Family Medicine

## 2017-05-18 ENCOUNTER — Ambulatory Visit
Admission: RE | Admit: 2017-05-18 | Discharge: 2017-05-18 | Disposition: A | Discharge status: Home / Self Care | Payer: BC Managed Care – PPO | Source: Ambulatory Visit | Attending: Family Medicine | Admitting: Family Medicine

## 2017-05-18 VITALS — BP 112/76 | HR 84 | Temp 97.7°F | Resp 16 | Ht 64.25 in | Wt 159.8 lb

## 2017-05-18 DIAGNOSIS — Z1211 Encounter for screening for malignant neoplasm of colon: Secondary | ICD-10-CM

## 2017-05-18 DIAGNOSIS — M79642 Pain in left hand: Secondary | ICD-10-CM | POA: Insufficient documentation

## 2017-05-18 DIAGNOSIS — Z0000 Encounter for general adult medical examination without abnormal findings: Secondary | ICD-10-CM | POA: Diagnosis not present

## 2017-05-18 DIAGNOSIS — M79641 Pain in right hand: Secondary | ICD-10-CM

## 2017-05-18 DIAGNOSIS — Z720 Tobacco use: Secondary | ICD-10-CM

## 2017-05-18 DIAGNOSIS — R972 Elevated prostate specific antigen [PSA]: Secondary | ICD-10-CM | POA: Diagnosis not present

## 2017-05-18 DIAGNOSIS — R7982 Elevated C-reactive protein (CRP): Secondary | ICD-10-CM

## 2017-05-18 LAB — CBC WITH DIFFERENTIAL/PLATELET
BASOS ABS: 0 {cells}/uL (ref 0–200)
Basophils Relative: 0 %
EOS ABS: 252 {cells}/uL (ref 15–500)
Eosinophils Relative: 2 %
HCT: 46.3 % (ref 38.5–50.0)
Hemoglobin: 15.7 g/dL (ref 13.2–17.1)
Lymphocytes Relative: 41 %
Lymphs Abs: 5166 cells/uL — ABNORMAL HIGH (ref 850–3900)
MCH: 29.9 pg (ref 27.0–33.0)
MCHC: 33.9 g/dL (ref 32.0–36.0)
MCV: 88.2 fL (ref 80.0–100.0)
MONOS PCT: 5 %
MPV: 9.2 fL (ref 7.5–12.5)
Monocytes Absolute: 630 cells/uL (ref 200–950)
Neutro Abs: 6552 cells/uL (ref 1500–7800)
Neutrophils Relative %: 52 %
PLATELETS: 228 10*3/uL (ref 140–400)
RBC: 5.25 MIL/uL (ref 4.20–5.80)
RDW: 14.8 % (ref 11.0–15.0)
WBC: 12.6 10*3/uL — ABNORMAL HIGH (ref 3.8–10.8)

## 2017-05-18 NOTE — Assessment & Plan Note (Signed)
Patient declines PSA

## 2017-05-18 NOTE — Assessment & Plan Note (Signed)
USPSTF grade A and B recommendations reviewed with patient; age-appropriate recommendations, preventive care, screening tests, etc discussed and encouraged; healthy living encouraged; see AVS for patient education given to patient Politely declines some tests

## 2017-05-18 NOTE — Assessment & Plan Note (Addendum)
Check with hand pain, and will refer to rheumatologist

## 2017-05-18 NOTE — Assessment & Plan Note (Signed)
Will get xrays and labs; if OA, then treat with tylenol and (+/-) mobic

## 2017-05-18 NOTE — Patient Instructions (Addendum)
I do encourage you to quit smoking Call [phone] to sign up for smoking cessation classes You can call 1-800-QUIT-NOW to talk with a smoking cessation coach  Let's get labs and xrays today   Steps to Quit Smoking Smoking tobacco can be bad for your health. It can also affect almost every organ in your body. Smoking puts you and people around you at risk for many serious long-lasting (chronic) diseases. Quitting smoking is hard, but it is one of the best things that you can do for your health. It is never too late to quit. What are the benefits of quitting smoking? When you quit smoking, you lower your risk for getting serious diseases and conditions. They can include:  Lung cancer or lung disease.  Heart disease.  Stroke.  Heart attack.  Not being able to have children (infertility).  Weak bones (osteoporosis) and broken bones (fractures).  If you have coughing, wheezing, and shortness of breath, those symptoms may get better when you quit. You may also get sick less often. If you are pregnant, quitting smoking can help to lower your chances of having a baby of low birth weight. What can I do to help me quit smoking? Talk with your doctor about what can help you quit smoking. Some things you can do (strategies) include:  Quitting smoking totally, instead of slowly cutting back how much you smoke over a period of time.  Going to in-person counseling. You are more likely to quit if you go to many counseling sessions.  Using resources and support systems, such as: ? Database administrator with a Social worker. ? Phone quitlines. ? Careers information officer. ? Support groups or group counseling. ? Text messaging programs. ? Mobile phone apps or applications.  Taking medicines. Some of these medicines may have nicotine in them. If you are pregnant or breastfeeding, do not take any medicines to quit smoking unless your doctor says it is okay. Talk with your doctor about counseling or other  things that can help you.  Talk with your doctor about using more than one strategy at the same time, such as taking medicines while you are also going to in-person counseling. This can help make quitting easier. What things can I do to make it easier to quit? Quitting smoking might feel very hard at first, but there is a lot that you can do to make it easier. Take these steps:  Talk to your family and friends. Ask them to support and encourage you.  Call phone quitlines, reach out to support groups, or work with a Social worker.  Ask people who smoke to not smoke around you.  Avoid places that make you want (trigger) to smoke, such as: ? Bars. ? Parties. ? Smoke-break areas at work.  Spend time with people who do not smoke.  Lower the stress in your life. Stress can make you want to smoke. Try these things to help your stress: ? Getting regular exercise. ? Deep-breathing exercises. ? Yoga. ? Meditating. ? Doing a body scan. To do this, close your eyes, focus on one area of your body at a time from head to toe, and notice which parts of your body are tense. Try to relax the muscles in those areas.  Download or buy apps on your mobile phone or tablet that can help you stick to your quit plan. There are many free apps, such as QuitGuide from the [employer] Office manager for Disease Control and Prevention). You can find more support from smokefree.gov and  other websites.  This information is not intended to replace advice given to you by your health care provider. Make sure you discuss any questions you have with your health care provider. Document Released: 09/09/2009 Document Revised: 07/11/2016 Document Reviewed: 03/30/2015 Elsevier Interactive Patient Education  2018 [place] Maintenance, Male A healthy lifestyle and preventive care is important for your health and wellness. Ask your health care provider about what schedule of regular examinations is right for you. What should I know  about weight and diet? Eat a Healthy Diet  Eat plenty of vegetables, fruits, whole grains, low-fat dairy products, and lean protein.  Do not eat a lot of foods high in solid fats, added sugars, or salt.  Maintain a Healthy Weight Regular exercise can help you achieve or maintain a healthy weight. You should:  Do at least 150 minutes of exercise each week. The exercise should increase your heart rate and make you sweat (moderate-intensity exercise).  Do strength-training exercises at least twice a week.  Watch Your Levels of Cholesterol and Blood Lipids  Have your blood tested for lipids and cholesterol every 5 years starting at 64 years of age. If you are at high risk for heart disease, you should start having your blood tested when you are 64 years old. You may need to have your cholesterol levels checked more often if: ? Your lipid or cholesterol levels are high. ? You are older than 64 years of age. ? You are at high risk for heart disease.  What should I know about cancer screening? Many types of cancers can be detected early and may often be prevented. Lung Cancer  You should be screened every year for lung cancer if: ? You are a current smoker who has smoked for at least 30 years. ? You are a former smoker who has quit within the past 15 years.  Talk to your health care provider about your screening options, when you should start screening, and how often you should be screened.  Colorectal Cancer  Routine colorectal cancer screening usually begins at 64 years of age and should be repeated every 5-10 years until you are 64 years old. You may need to be screened more often if early forms of precancerous polyps or small growths are found. Your health care provider may recommend screening at an earlier age if you have risk factors for colon cancer.  Your health care provider may recommend using home test kits to check for hidden blood in the stool.  A small camera at the end of  a tube can be used to examine your colon (sigmoidoscopy or colonoscopy). This checks for the earliest forms of colorectal cancer.  Prostate and Testicular Cancer  Depending on your age and overall health, your health care provider may do certain tests to screen for prostate and testicular cancer.  Talk to your health care provider about any symptoms or concerns you have about testicular or prostate cancer.  Skin Cancer  Check your skin from head to toe regularly.  Tell your health care provider about any new moles or changes in moles, especially if: ? There is a change in a mole's size, shape, or color. ? You have a mole that is larger than a pencil eraser.  Always use sunscreen. Apply sunscreen liberally and repeat throughout the day.  Protect yourself by wearing long sleeves, pants, a wide-brimmed hat, and sunglasses when outside.  What should I know about heart disease, diabetes, and high blood pressure?  If you are 38-72 years of age, have your blood pressure checked every 3-5 years. If you are 53 years of age or older, have your blood pressure checked every year. You should have your blood pressure measured twice-once when you are at a hospital or clinic, and once when you are not at a hospital or clinic. Record the average of the two measurements. To check your blood pressure when you are not at a hospital or clinic, you can use: ? An automated blood pressure machine at a pharmacy. ? A home blood pressure monitor.  Talk to your health care provider about your target blood pressure.  If you are between 65-39 years old, ask your health care provider if you should take aspirin to prevent heart disease.  Have regular diabetes screenings by checking your fasting blood sugar level. ? If you are at a normal weight and have a low risk for diabetes, have this test once every three years after the age of 6. ? If you are overweight and have a high risk for diabetes, consider being tested  at a younger age or more often.  A one-time screening for abdominal aortic aneurysm (AAA) by ultrasound is recommended for men aged 21-75 years who are current or former smokers. What should I know about preventing infection? Hepatitis B If you have a higher risk for hepatitis B, you should be screened for this virus. Talk with your health care provider to find out if you are at risk for hepatitis B infection. Hepatitis C Blood testing is recommended for:  Everyone born from [birth year] through 1963.  Anyone with known risk factors for hepatitis C.  Sexually Transmitted Diseases (STDs)  You should be screened each year for STDs including gonorrhea and chlamydia if: ? You are sexually active and are younger than 64 years of age. ? You are older than 64 years of age and your health care provider tells you that you are at risk for this type of infection. ? Your sexual activity has changed since you were last screened and you are at an increased risk for chlamydia or gonorrhea. Ask your health care provider if you are at risk.  Talk with your health care provider about whether you are at high risk of being infected with HIV. Your health care provider may recommend a prescription medicine to help prevent HIV infection.  What else can I do?  Schedule regular health, dental, and eye exams.  Stay current with your vaccines (immunizations).  Do not use any tobacco products, such as cigarettes, chewing tobacco, and e-cigarettes. If you need help quitting, ask your health care provider.  Limit alcohol intake to no more than 2 drinks per day. One drink equals 12 ounces of beer, 5 ounces of wine, or 1 ounces of hard liquor.  Do not use street drugs.  Do not share needles.  Ask your health care provider for help if you need support or information about quitting drugs.  Tell your health care provider if you often feel depressed.  Tell your health care provider if you have ever been abused or do  not feel safe at home. This information is not intended to replace advice given to you by your health care provider. Make sure you discuss any questions you have with your health care provider. Document Released: 05/11/2008 Document Revised: 07/12/2016 Document Reviewed: 08/17/2015 Elsevier Interactive Patient Education  [employer].

## 2017-05-18 NOTE — Progress Notes (Signed)
Patient ID: Andres Mullins, male   DOB: [birth year], 66 y.o.   MRN: [id number]   Subjective:   Andres Mullins is a 66 y.o. male here for a complete physical exam  Interim issues since last visit: had surgery in left shoulder  Having age-related arthritic pain; mainly hands Bases of thumbs are painful at times as well as MCPs Nodules in palms Has psoriasis Father had RA Joints feel stiff Trigger finger middle fingers bilaterally; ortho gave him corticosteroids Two alcoholic drinks a week So many previous activities and injuries; no fractures Takes ibuprofen when bothersome; used mobic after shoulder surgery, and hands improved No pain in the knees or lower back  He does not want cholesterol checked because he would take anything for it anyway; he is not interested in any  He has had elevated PSA and had that checked it out by urologist; had it biopsied and it was an infection; was told no more biopsies; he says that if he has symptoms, he'll let us know; patient politely declines a test; no nocturia most nights  USPSTF grade A and B recommendations Depression:  Depression screen [facility] 2/9 05/18/2017 10/17/2016 10/05/2016  Decreased Interest 0 0 0  Down, Depressed, Hopeless 0 0 0  PHQ - 2 Score 0 0 0   Hypertension: BP Readings from Last 3 Encounters:  05/18/17 112/76  01/17/17 104/74  10/23/16 125/74   Obesity: Wt Readings from Last 3 Encounters:  05/18/17 159 lb 12.8 oz (72.5 kg)  01/17/17 164 lb 8 oz (74.6 kg)  10/23/16 160 lb 8 oz (72.8 kg)   BMI Readings from Last 3 Encounters:  05/18/17 27.22 kg/m  01/17/17 26.55 kg/m  10/23/16 25.91 kg/m    Alcohol: 2-3 a week Tobacco use: not ready to quit HIV, hep B, hep C: not interested Married STD testing and prevention (chl/gon/syphilis): none desired Lipids: declined Lab Results  Component Value Date   CHOL 207 (H) 10/06/2016   CHOL CANCELED 10/05/2016   Lab Results  Component Value Date   HDL 46 10/06/2016   HDL CANCELED 10/05/2016   Lab Results  Component Value Date   LDLCALC 138 (H) 10/06/2016   [place] CANCELED 10/05/2016   Lab Results  Component Value Date   TRIG 112 10/06/2016   TRIG CANCELED 10/05/2016   Lab Results  Component Value Date   CHOLHDL 4.5 10/06/2016   CHOLHDL CANCELED 10/05/2016   No results found for: LDLDIRECT Glucose:  Glucose, Bld  Date Value Ref Range Status  10/06/2016 80 65 - 99 mg/dL Final   Colorectal cancer: not in the family, never had screening; discussed cologuard Prostate cancer: declined by patient No results found for: PSA Lung cancer:  Encouraged chest CT, continue yearly AAA: n/a Aspirin: not taking an aspirin, does not sit well Diet: pretty good, tries to eat better Exercise: active Skin cancer: saw dermatologist two days ago; few places burned off May need referral in future  Past Medical History:  Diagnosis Date  . Atherosclerosis of coronary artery 09/28/2016   Chest CT Nov 2017  . Elevated C-reactive protein (CRP)   . Elevated PSA   . Fractured sternum 1999  . Herpes   . History of rib fracture 1999   multiple  . Hyperlipidemia   . Psoriasis   . Rheumatoid factor positive April 2015  . Rosacea   . Thoracic aortic atherosclerosis ([place]) 09/28/2016   Chest CT Nov 2017  . Tobacco abuse   . Vitamin D deficiency disease  Past Surgical History:  Procedure Laterality Date  . APPENDECTOMY  1974  . SHOULDER ARTHROSCOPY  10/23/2016   Procedure: ARTHROSCOPY SHOULDER WITH DISTAL CLAVICLE EXCISION, PARTIAL ACROMIONECTOMY, AND DEBRIDEMENT;  Surgeon: Earnestine Leys, MD;  Location: [facility] ORS;  Service: Orthopedics;;  . TIBIA FRACTURE SURGERY Left 2004   titanium rod, ORIF   Family History  Problem Relation Age of Onset  . Heart disease Father   . Osteoarthritis Father   . Alzheimer's disease Father   . Cancer Neg Hx   . Diabetes Neg Hx   . Hypertension Neg Hx   . Stroke Neg Hx   . COPD Neg Hx    Social History  Substance Use  Topics  . Smoking status: Current Every Day Smoker    Packs/day: 0.25    Years: 44.00    Types: Cigarettes  . Smokeless tobacco: Never Used  . Alcohol use 1.8 oz/week    3 Glasses of wine per week     Comment: a couple glasses of wine a week   Review of Systems  Constitutional: Negative for unexpected weight change.  HENT: Negative for hearing loss.   Eyes: Negative for visual disturbance.  Cardiovascular: Negative for chest pain.    Objective:   Vitals:   05/18/17 1058  BP: 112/76  Pulse: 84  Resp: 16  Temp: 97.7 F (36.5 C)  TempSrc: Oral  SpO2: 97%  Weight: 159 lb 12.8 oz (72.5 kg)  Height: 5' 4.25" (1.632 m)   Body mass index is 27.22 kg/m. Wt Readings from Last 3 Encounters:  05/18/17 159 lb 12.8 oz (72.5 kg)  01/17/17 164 lb 8 oz (74.6 kg)  10/23/16 160 lb 8 oz (72.8 kg)   Physical Exam  Constitutional: He appears well-developed and well-nourished. No distress.  HENT:  Head: Normocephalic and atraumatic.  Nose: Nose normal.  Mouth/Throat: Oropharynx is clear and moist.  Eyes: EOM are normal. No scleral icterus.  Neck: No JVD present. No thyromegaly present.  Cardiovascular: Normal rate, regular rhythm and normal heart sounds.   Pulmonary/Chest: Effort normal and breath sounds normal. No respiratory distress. He has no wheezes. He has no rales.  Abdominal: Soft. Bowel sounds are normal. He exhibits no distension. There is no tenderness. There is no guarding.  Musculoskeletal: Normal range of motion. He exhibits no edema.  Lymphadenopathy:    He has no cervical adenopathy.  Neurological: He is alert. He displays normal reflexes. He exhibits normal muscle tone. Coordination normal.  Skin: Skin is warm and dry. No rash noted. He is not diaphoretic. No erythema. No pallor.  Psychiatric: He has a normal mood and affect. His behavior is normal. Judgment and thought content normal.    Assessment/Plan:   Problem List Items Addressed This Visit      Other    Tobacco abuse    Encouraged patient to quit; patient not ready at this time; he is aware that I am here to help      Screen for colon cancer    cologuard discussed, ordered      Relevant Orders   Cologuard   Preventative health care    USPSTF grade A and B recommendations reviewed with patient; age-appropriate recommendations, preventive care, screening tests, etc discussed and encouraged; healthy living encouraged; see AVS for patient education given to patient Politely declines some tests      Relevant Orders   CBC with Differential/Platelet   COMPLETE METABOLIC PANEL WITH GFR   Elevated PSA    Patient declines  PSA      Elevated C-reactive protein (CRP)    Check with hand pain, and will refer to rheumatologist       Relevant Orders   C-reactive protein   Bilateral hand pain    Will get xrays and labs; if OA, then treat with tylenol and (+/-) mobic      Relevant Orders   DG Hand Complete Left   DG Hand Complete Right   ANA,IFA RA Diag Pnl w/rflx Tit/Patn   VITAMIN D 25 Hydroxy (Vit-D Deficiency, Fractures)      No orders of the defined types were placed in this encounter.  Orders Placed This Encounter  Procedures  . DG Hand Complete Left    Standing Status:   Future    Standing Expiration Date:   07/18/2018    Order Specific Question:   Reason for Exam (SYMPTOM  OR DIAGNOSIS REQUIRED)    Answer:   bilateral hand pain, worse base of thumbs and over MCPs    Order Specific Question:   Preferred imaging location?    Answer:   [facility]    Order Specific Question:   Radiology Contrast Protocol - do NOT remove file path    Answer:   \\[email or web address]  . DG Hand Complete Right    Standing Status:   Future    Standing Expiration Date:   07/18/2018    Order Specific Question:   Reason for Exam (SYMPTOM  OR DIAGNOSIS REQUIRED)    Answer:   bilateral hand pain, worse base of thumbs and over MCPs    Order Specific  Question:   Preferred imaging location?    Answer:   [facility]    Order Specific Question:   Radiology Contrast Protocol - do NOT remove file path    Answer:   \\[email or web address]  . CBC with Differential/Platelet  . COMPLETE METABOLIC PANEL WITH GFR  . ANA,IFA RA Diag Pnl w/rflx Tit/Patn  . C-reactive protein  . VITAMIN D 25 Hydroxy (Vit-D Deficiency, Fractures)  . Cologuard    Follow up plan: No Follow-up on file.  An After Visit Summary was printed and given to the patient.

## 2017-05-18 NOTE — Assessment & Plan Note (Addendum)
Encouraged patient to quit; patient not ready at this time; he is aware that I am here to help

## 2017-05-18 NOTE — Assessment & Plan Note (Signed)
cologuard discussed, ordered

## 2017-05-19 LAB — COMPLETE METABOLIC PANEL WITH GFR
ALT: 15 U/L (ref 9–46)
AST: 14 U/L (ref 10–35)
Albumin: 4.3 g/dL (ref 3.6–5.1)
Alkaline Phosphatase: 80 U/L (ref 40–115)
BILIRUBIN TOTAL: 0.5 mg/dL (ref 0.2–1.2)
BUN: 13 mg/dL (ref 7–25)
CHLORIDE: 105 mmol/L (ref 98–110)
CO2: 20 mmol/L (ref 20–31)
Calcium: 9.7 mg/dL (ref 8.6–10.3)
Creat: 0.8 mg/dL (ref 0.70–1.25)
Glucose, Bld: 83 mg/dL (ref 65–99)
Potassium: 4.3 mmol/L (ref 3.5–5.3)
Sodium: 139 mmol/L (ref 135–146)
Total Protein: 6.3 g/dL (ref 6.1–8.1)

## 2017-05-19 LAB — VITAMIN D 25 HYDROXY (VIT D DEFICIENCY, FRACTURES): VIT D 25 HYDROXY: 32 ng/mL (ref 30–100)

## 2017-05-23 LAB — ANA,IFA RA DIAG PNL W/RFLX TIT/PATN
ANA: NEGATIVE
Rhuematoid fact SerPl-aCnc: 178 IU/mL — ABNORMAL HIGH (ref <14)

## 2017-05-23 LAB — C-REACTIVE PROTEIN: CRP: 5.6 mg/L (ref <8.0)

## 2017-05-24 ENCOUNTER — Other Ambulatory Visit: Payer: Self-pay

## 2017-05-24 DIAGNOSIS — R768 Other specified abnormal immunological findings in serum: Secondary | ICD-10-CM

## 2017-05-24 DIAGNOSIS — R7689 Other specified abnormal immunological findings in serum: Secondary | ICD-10-CM

## 2017-05-28 LAB — FECAL OCCULT BLOOD, GUAIAC: FECAL OCCULT BLD: NEGATIVE

## 2017-05-28 LAB — COLOGUARD: COLOGUARD: NEGATIVE

## 2017-09-18 ENCOUNTER — Telehealth: Payer: Self-pay | Admitting: *Deleted

## 2017-09-18 DIAGNOSIS — Z87891 Personal history of nicotine dependence: Secondary | ICD-10-CM

## 2017-09-18 DIAGNOSIS — Z122 Encounter for screening for malignant neoplasm of respiratory organs: Secondary | ICD-10-CM

## 2017-09-18 NOTE — Telephone Encounter (Signed)
Notified patient that annual lung cancer screening low dose CT scan is due currently or will be in near future. Confirmed that patient is within the age range of 55-77, and asymptomatic, (no signs or symptoms of lung cancer). Patient denies illness that would prevent curative treatment for lung cancer if found. Verified smoking history, (current, 44.5 pack year). The shared decision making visit was done 09/26/16. Patient is agreeable for CT scan being scheduled.

## 2017-09-27 ENCOUNTER — Ambulatory Visit
Admission: RE | Admit: 2017-09-27 | Discharge: 2017-09-27 | Disposition: A | Discharge status: Home / Self Care | Payer: BC Managed Care – PPO | Source: Ambulatory Visit | Attending: Nurse Practitioner | Admitting: Nurse Practitioner

## 2017-09-27 DIAGNOSIS — Z122 Encounter for screening for malignant neoplasm of respiratory organs: Secondary | ICD-10-CM | POA: Insufficient documentation

## 2017-09-27 DIAGNOSIS — J439 Emphysema, unspecified: Secondary | ICD-10-CM | POA: Insufficient documentation

## 2017-09-27 DIAGNOSIS — Z87891 Personal history of nicotine dependence: Secondary | ICD-10-CM | POA: Diagnosis present

## 2017-10-01 ENCOUNTER — Encounter: Payer: Self-pay | Admitting: *Deleted

## 2017-12-12 DIAGNOSIS — R768 Other specified abnormal immunological findings in serum: Secondary | ICD-10-CM | POA: Insufficient documentation

## 2017-12-12 DIAGNOSIS — IMO0002 Reserved for concepts with insufficient information to code with codable children: Secondary | ICD-10-CM | POA: Insufficient documentation

## 2018-01-02 DIAGNOSIS — M65331 Trigger finger, right middle finger: Secondary | ICD-10-CM | POA: Insufficient documentation

## 2018-04-09 ENCOUNTER — Ambulatory Visit (INDEPENDENT_AMBULATORY_CARE_PROVIDER_SITE_OTHER): Payer: BC Managed Care – PPO | Admitting: Nurse Practitioner

## 2018-04-09 ENCOUNTER — Encounter: Payer: Self-pay | Admitting: Nurse Practitioner

## 2018-04-09 VITALS — BP 112/70 | HR 78 | Temp 97.9°F | Resp 18 | Ht 64.0 in | Wt 165.9 lb

## 2018-04-09 DIAGNOSIS — L719 Rosacea, unspecified: Secondary | ICD-10-CM

## 2018-04-09 MED ORDER — METRONIDAZOLE 1 % EX GEL: Freq: Every day | CUTANEOUS | 1 refills | Status: DC

## 2018-04-09 NOTE — Patient Instructions (Signed)
Dose of Metrogel 1% for Rosacea The standard instructions for Metrogel 1% gel are to apply it to the skin once daily, usually in the evening after cleansing the face. Choose a mild cleanser to help minimize rosacea symptoms. For maximum effectiveness, do not "spot treat" and do not just apply Metrogel during flares. Treat the entire area, every day. However, people who have occasional flares may need to only treat the area during the flares.   Although Metrogel 1% gel is usually applied at night, you can apply it in the morning if it is more convenient. In that case, apply it after cleansing but before applying cosmetics.    Rosacea Rosacea is a long-term (chronic) condition that affects the skin of the face, including the cheeks, nose, brow, and chin. This condition can also affect the eyes. Rosacea causes blood vessels near the surface of the skin to get bigger (be enlarged), and that makes the skin red. There is no cure for this condition, but treatment can help to control your symptoms. Follow these instructions at home: Skin Care Take care of your skin as told by your doctor. Your doctor may tell you do these things:  Wash your skin gently two or more times each day.  Use mild soap.  Use a sunscreen or sunblock with SPF 30 or greater.  Use gentle cosmetics that are meant for sensitive skin.  Shave with an electric shaver instead of a blade.  Lifestyle  Try to keep track of what foods trigger this condition. Avoid any triggers. These may include: ? Spicy foods. ? Seafood. ? Cheese. ? Hot liquids. ? Nuts. ? Chocolate. ? Iodized salt.  Do not drink alcohol.  Avoid extremely cold or hot temperatures.  Try to reduce your stress. If you need help to do this, talk with your doctor.  When you exercise, do these things to stay cool: ? Limit your sun exposure. ? Use a fan. ? Exercise for a shorter time, and exercise more often. General instructions  Keep all follow-up visits  as told by your doctor. This is important.  Take over-the-counter and prescription medicines only as told by your doctor.  If your eyelids are affected, press warm compresses to them. Do this as told by your doctor.  If you were prescribed an antibiotic medicine, apply or take it as told by your doctor. Do not stop using the antibiotic even if your condition improves. Contact a doctor if:  Your symptoms get worse.  Your symptoms do not improve after two months of treatment.  You have new symptoms.  You have any changes in vision or you have problems with your eyes, such as redness or itching.  You feel very sad (depressed).  You lose your appetite.  You have trouble concentrating. This information is not intended to replace advice given to you by your health care provider. Make sure you discuss any questions you have with your health care provider. Document Released: 02/05/2012 Document Revised: 04/20/2016 Document Reviewed: 01/20/2015 Elsevier Interactive Patient Education  [employer].

## 2018-04-09 NOTE — Progress Notes (Addendum)
Name: Andres Mullins   MRN: [id number]    DOB: 01-10-1951   Date:04/09/2018       Progress Note  Subjective  Chief Complaint  Chief Complaint  Patient presents with  . Medication Refill    doxycyline for rosacea flare on nose    HPI  Patient states has occasional rosacea flares- States is starting to get some redness on nose, planning on going outside for an air show and is concerned that is going to flare up.   Has been on doxycycline multiple times for this in the past and has worked well. Has never tried anything else including topical options. No injury, trauma, ulcerations.     Patient Active Problem List   Diagnosis Date Noted  . Bilateral hand pain 05/18/2017  . Preventative health care 05/18/2017  . Screen for colon cancer 05/18/2017  . Atrial tachycardia ([place]) 01/18/2017  . Coronary artery calcification 01/18/2017  . Supraspinatus tendinitis, left 10/22/2016  . Celiac disease 10/05/2016  . Atherosclerosis of coronary artery 09/28/2016  . Thoracic aortic atherosclerosis ([place]) 09/28/2016  . Personal history of tobacco use, presenting hazards to health 09/26/2016  . 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 Tendonitis, Achilles, left 10/14/2015  . 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 Herpes   . Rosacea   . Psoriasis   . Hyperlipidemia   . Vitamin D deficiency disease   . Tobacco abuse   . Elevated PSA   . Elevated C-reactive protein (CRP)     Past Medical History:  Diagnosis Date  . Atherosclerosis of coronary artery 09/28/2016   Chest CT Nov 2017  . Elevated C-reactive protein (CRP)   . Elevated PSA   . Fractured sternum 1999  . Herpes   . History of rib fracture 1999   multiple  . Hyperlipidemia   . Psoriasis   . Rheumatoid factor positive April 2015  . Rosacea   . Thoracic aortic atherosclerosis ([place]) 09/28/2016   Chest CT Nov 2017  . Tobacco abuse   . Vitamin D deficiency disease     Past Surgical History:  Procedure Laterality Date  .  APPENDECTOMY  1974  . SHOULDER ARTHROSCOPY  10/23/2016   Procedure: ARTHROSCOPY SHOULDER WITH DISTAL CLAVICLE EXCISION, PARTIAL ACROMIONECTOMY, AND DEBRIDEMENT;  Surgeon: Earnestine Leys, MD;  Location: [facility] ORS;  Service: Orthopedics;;  . TIBIA FRACTURE SURGERY Left 2004   titanium rod, ORIF    Social History   Tobacco Use  . Smoking status: Current Every Day Smoker    Packs/day: 0.25    Years: 44.00    Pack years: 11.00    Types: Cigarettes  . Smokeless tobacco: Never Used  Substance Use Topics  . Alcohol use: Yes    Alcohol/week: 1.8 oz    Types: 3 Glasses of wine per week    Comment: a couple glasses of wine a week     Current Outpatient Medications:  .  doxycycline (VIBRA-TABS) 100 MG tablet, Take 1 tablet (100 mg total) by mouth 2 (two) times daily., Disp: 10 tablet, Rfl: 5 .  valACYclovir (VALTREX) 500 MG tablet, Take 1 tablet (500 mg total) by mouth 2 (two) times daily as needed., Disp: 30 tablet, Rfl: 3  No Known Allergies  ROS  No other specific complaints in a complete review of systems (except as listed in HPI above).  Objective  Vitals:   04/09/18 1016  BP: 112/70  Pulse: 78  Resp: 18  Temp: 97.9 F (36.6 C)  TempSrc: Oral  SpO2: 94%  Weight: 165 lb 14.4 oz (75.3 kg)  Height: 5' 4"  (1.626 m)   Body mass index is 28.48 kg/m.  Nursing Note and Vital Signs reviewed.  Physical Exam  Constitutional: Patient appears well-developed and well-nourished.  No distress.  HEENT: head atraumatic, normocephalic, right side of nose has area of Telangiectases no pustules, edema or ulcerations noted. no nasal discharge Cardiovascular: Normal rate Pulmonary/Chest: Effort normal  Psychiatric: Patient has a normal mood and affect. behavior is normal. Judgment and thought content normal.  No results found for this or any previous visit (from the past 72 hour(s)).  Assessment & Plan  1. Rosacea - metroNIDAZOLE (METROGEL) 1 % gel; Apply topically daily.   Dispense: 45 g; Refill: 1    -Red flags and when to present for emergency care or RTC including fever >101.11F, chest pain, shortness of breath, new/worsening/un-resolving symptoms, reviewed with patient at time of visit. Follow up and care instructions discussed and provided in AVS. -Reviewed Health Maintenance: follow up in 3 months with pcp   -------------------------------------------- Discussed; will try something other than doxy due to sun sensitivity I have reviewed this encounter including the documentation in this note and/or discussed this patient with the provider, Suezanne Cheshire DNP AGNP-C. I am certifying that I agree with the content of this note as supervising physician. Enid Derry, [facility] 04/24/2018, 5:11 PM

## 2018-09-18 ENCOUNTER — Telehealth: Payer: Self-pay

## 2018-09-18 NOTE — Telephone Encounter (Signed)
Call pt regarding lung screening. Left message for pt to return call.

## 2018-09-24 ENCOUNTER — Telehealth: Payer: Self-pay | Admitting: Nurse Practitioner

## 2018-09-25 ENCOUNTER — Telehealth: Payer: Self-pay | Admitting: *Deleted

## 2018-09-25 DIAGNOSIS — Z122 Encounter for screening for malignant neoplasm of respiratory organs: Secondary | ICD-10-CM

## 2018-09-25 DIAGNOSIS — Z87891 Personal history of nicotine dependence: Secondary | ICD-10-CM

## 2018-09-25 NOTE — Telephone Encounter (Signed)
Patient has been notified that annual lung cancer screening low dose CT scan is due currently or will be in near future. Confirmed that patient is within the age range of 55-77, and asymptomatic, (no signs or symptoms of lung cancer). Patient denies illness that would prevent curative treatment for lung cancer if found. Verified smoking history, (current, 45 pack year). The shared decision making visit was done 09/26/16. Patient is agreeable for CT scan being scheduled.

## 2018-09-30 ENCOUNTER — Ambulatory Visit
Admission: RE | Admit: 2018-09-30 | Discharge: 2018-09-30 | Disposition: A | Discharge status: Home / Self Care | Payer: Medicare Other | Source: Ambulatory Visit | Attending: Oncology | Admitting: Oncology

## 2018-09-30 DIAGNOSIS — K5730 Diverticulosis of large intestine without perforation or abscess without bleeding: Secondary | ICD-10-CM | POA: Insufficient documentation

## 2018-09-30 DIAGNOSIS — J432 Centrilobular emphysema: Secondary | ICD-10-CM | POA: Diagnosis not present

## 2018-09-30 DIAGNOSIS — Z87891 Personal history of nicotine dependence: Secondary | ICD-10-CM | POA: Insufficient documentation

## 2018-09-30 DIAGNOSIS — I700 Atherosclerosis of aorta: Secondary | ICD-10-CM | POA: Insufficient documentation

## 2018-09-30 DIAGNOSIS — Z122 Encounter for screening for malignant neoplasm of respiratory organs: Secondary | ICD-10-CM | POA: Diagnosis not present

## 2018-09-30 DIAGNOSIS — I2510 Atherosclerotic heart disease of native coronary artery without angina pectoris: Secondary | ICD-10-CM | POA: Insufficient documentation

## 2018-10-01 ENCOUNTER — Encounter: Payer: Self-pay | Admitting: *Deleted

## 2018-10-01 ENCOUNTER — Telehealth: Payer: Self-pay | Admitting: *Deleted

## 2018-10-01 NOTE — Telephone Encounter (Signed)
Notified patient of LDCT lung cancer screening program results with recommendation for 12 month follow up imaging.  Also notified of incidental findings noted below and is encouraged to discuss further questions with PCP who will receive a copy of this not and/or the CT reports.  Patient verbalized understanding.      IMPRESSION: 1. Lung-RADS 2S, benign appearance or behavior. Continue annual screening with low-dose chest CT without contrast in 12 months. 2. The "S" modifier above refers to potentially clinically significant non lung cancer related findings. Specifically, there is aortic atherosclerosis, in addition to left anterior descending coronary artery disease. Please note that although the presence of coronary artery calcium documents the presence of coronary artery disease, the severity of this disease and any potential stenosis cannot be assessed on this non-gated CT examination. Assessment for potential risk factor modification, dietary therapy or pharmacologic therapy may be warranted, if clinically indicated. 3. Mild diffuse bronchial wall thickening with mild centrilobular and paraseptal emphysema; imaging findings suggestive of underlying COPD. 4. Colonic diverticulosis in the transverse colon.  Aortic Atherosclerosis (ICD10-I70.0) and Emphysema (ICD10-J43.9).

## 2018-10-03 ENCOUNTER — Telehealth: Payer: Self-pay | Admitting: Family Medicine

## 2018-10-03 NOTE — Telephone Encounter (Signed)
Spoke with pt and he scheduled both appts

## 2018-10-03 NOTE — Telephone Encounter (Signed)
Patient has turned 12 I've not seen him since June of 2018 He had abnormal findings on his chest CT that I also want to discuss with him Please recommend he come in for a Welcome to Medicare visit as well as a visit for the chest CT issues (can be back to back or separate days, but it's two separate things)

## 2018-10-14 ENCOUNTER — Encounter: Payer: Self-pay | Admitting: Family Medicine

## 2018-10-14 ENCOUNTER — Ambulatory Visit (INDEPENDENT_AMBULATORY_CARE_PROVIDER_SITE_OTHER): Payer: Medicare Other | Admitting: Family Medicine

## 2018-10-14 VITALS — BP 118/72 | HR 90 | Temp 98.0°F | Ht 64.0 in | Wt 170.7 lb

## 2018-10-14 DIAGNOSIS — N289 Disorder of kidney and ureter, unspecified: Secondary | ICD-10-CM

## 2018-10-14 DIAGNOSIS — Z720 Tobacco use: Secondary | ICD-10-CM

## 2018-10-14 DIAGNOSIS — I2584 Coronary atherosclerosis due to calcified coronary lesion: Secondary | ICD-10-CM

## 2018-10-14 DIAGNOSIS — I2510 Atherosclerotic heart disease of native coronary artery without angina pectoris: Secondary | ICD-10-CM

## 2018-10-14 DIAGNOSIS — I700 Atherosclerosis of aorta: Secondary | ICD-10-CM

## 2018-10-14 DIAGNOSIS — K900 Celiac disease: Secondary | ICD-10-CM | POA: Diagnosis not present

## 2018-10-14 DIAGNOSIS — L719 Rosacea, unspecified: Secondary | ICD-10-CM

## 2018-10-14 MED ORDER — DOXYCYCLINE HYCLATE 100 MG PO TABS: 100.0000 mg | ORAL_TABLET | Freq: Two times a day (BID) | ORAL | 5 refills | Status: DC

## 2018-10-14 NOTE — Assessment & Plan Note (Signed)
No chest pain; saw cardiologist and had stress tests; symptom free

## 2018-10-14 NOTE — Progress Notes (Signed)
BP 118/72   Pulse 90   Temp 98 F (36.7 C)   Ht 5' 4"  (1.626 m)   Wt 170 lb 11.2 oz (77.4 kg)   SpO2 99%   BMI 29.30 kg/m    Subjective:    Patient ID: Andres Mullins, male    DOB: [birth year], 67 y.o.   MRN: [id number]  HPI: Andres Mullins is a 67 y.o. male  Chief Complaint  Patient presents with  . Follow-up    HPI Patient is here to review an abnormal CT scan CAD; no chest pain Emphysema; no symptoms; not limited by lungs at all with his activities Able to bike and hike and go upstairs, kayak  Chest CT Sep 30, 2018 CLINICAL DATA:  67 year old male current smoker with 45 pack-year history of smoking. Lung cancer screening examination.  EXAM: CT CHEST WITHOUT CONTRAST LOW-DOSE FOR LUNG CANCER SCREENING  TECHNIQUE: Multidetector CT imaging of the chest was performed following the standard protocol without IV contrast.  COMPARISON:  Low-dose lung cancer screening chest CT 09/27/2017.  FINDINGS: Cardiovascular: Heart size is normal. There is no significant pericardial fluid, thickening or pericardial calcification. Atherosclerotic calcifications in the left anterior descending coronary artery.  Mediastinum/Nodes: No pathologically enlarged mediastinal or hilar lymph nodes. Please note that accurate exclusion of hilar adenopathy is limited on noncontrast CT scans. Esophagus is unremarkable in appearance. No axillary lymphadenopathy.  Lungs/Pleura: Small calcified and noncalcified pulmonary nodules are noted in the lungs. The largest noncalcified nodule is in the periphery of the left upper lobe (axial image 62 of series 3), with a volume derived mean diameter of 2.8 mm. No larger more suspicious appearing pulmonary nodules or masses are noted. No acute consolidative airspace disease. No pleural effusions. Mild diffuse bronchial wall thickening with mild centrilobular and paraseptal emphysema.  Upper Abdomen: Colonic diverticulosis in the transverse  colon. Incompletely imaged low-attenuation lesion in the interpolar region of the right kidney measuring at least 4.8 cm, not characterized on today's noncontrast CT examination, but statistically likely to represent a cyst. Aortic atherosclerosis.  Musculoskeletal: Old healed fracture of the mid sternum. There are no aggressive appearing lytic or blastic lesions noted in the visualized portions of the skeleton.  IMPRESSION: 1. Lung-RADS 2S, benign appearance or behavior. Continue annual screening with low-dose chest CT without contrast in 12 months. 2. The "S" modifier above refers to potentially clinically significant non lung cancer related findings. Specifically, there is aortic atherosclerosis, in addition to left anterior descending coronary artery disease. Please note that although the presence of coronary artery calcium documents the presence of coronary artery disease, the severity of this disease and any potential stenosis cannot be assessed on this non-gated CT examination. Assessment for potential risk factor modification, dietary therapy or pharmacologic therapy may be warranted, if clinically indicated. 3. Mild diffuse bronchial wall thickening with mild centrilobular and paraseptal emphysema; imaging findings suggestive of underlying COPD. 4. Colonic diverticulosis in the transverse colon.  Aortic Atherosclerosis (ICD10-I70.0) and Emphysema (ICD10-J43.9).   Electronically Signed   By: Vinnie Langton M.D.   On: 09/30/2018 14:05   Had irregular heart rhythm; saw Dr. Saunders Revel; patient cut back on his caffeine intake and it went away; did not like the metoprolol; cut back on the coffee and it went away; he has had a few stress tests; just in an age bracket and always passed with flying colors; took physical fitness tests and did it just fine; not interested in another stress test  High cholesterol  Lab Results  Component Value Date   CHOL 226 (H) 10/14/2018    HDL 50 10/14/2018   LDLCALC 152 (H) 10/14/2018   TRIG 119 10/14/2018   CHOLHDL 4.5 10/14/2018   Smoking; smokes 1/2 ppd or less; done it for so long; he met Burgess Estelle, RN, nurse navigator; patient has been in this study for years; third one now  Celiac disease; gluten free now and no problems; off of gluten for five years; diverticulosis for five years; regular BMs; very cautious with intake  Elevated PSA; patient does not want this checked; he has no symptoms; he sleeps all night long; no nocturia, just 1 or 2x a week; elevated PSA came up once wth prostatiis  Rosacea; on doxy and metrogel; metrogel for maintenance; doxy for outbreaks  Depression screen [facility] 2/9 10/14/2018 05/18/2017 10/17/2016 10/05/2016  Decreased Interest 0 0 0 0  Down, Depressed, Hopeless 0 0 0 0  PHQ - 2 Score 0 0 0 0  Altered sleeping 0 - - -  Tired, decreased energy 0 - - -  Change in appetite 0 - - -  Feeling bad or failure about yourself  0 - - -  Trouble concentrating 0 - - -  Moving slowly or fidgety/restless 0 - - -  Suicidal thoughts 0 - - -  PHQ-9 Score 0 - - -  Difficult doing work/chores Not difficult at all - - -   Fall Risk  10/14/2018 05/18/2017 10/17/2016 10/05/2016  Falls in the past year? 0 No No No    Relevant past medical, surgical, family and social history reviewed Past Medical History:  Diagnosis Date  . Atherosclerosis of coronary artery 09/28/2016   Chest CT Nov 2017  . Elevated C-reactive protein (CRP)   . Elevated PSA   . Fractured sternum 1999  . Herpes   . History of rib fracture 1999   multiple  . Hyperlipidemia   . Psoriasis   . Rheumatoid factor positive April 2015  . Rosacea   . Thoracic aortic atherosclerosis ([place]) 09/28/2016   Chest CT Nov 2017  . Tobacco abuse   . Vitamin D deficiency disease    Past Surgical History:  Procedure Laterality Date  . APPENDECTOMY  1974  . SHOULDER ARTHROSCOPY  10/23/2016   Procedure: ARTHROSCOPY SHOULDER WITH DISTAL CLAVICLE  EXCISION, PARTIAL ACROMIONECTOMY, AND DEBRIDEMENT;  Surgeon: Earnestine Leys, MD;  Location: [facility] ORS;  Service: Orthopedics;;  . TIBIA FRACTURE SURGERY Left 2004   titanium rod, ORIF   Family History  Problem Relation Age of Onset  . Heart disease Father   . Osteoarthritis Father   . Alzheimer's disease Father   . Cancer Neg Hx   . Diabetes Neg Hx   . Hypertension Neg Hx   . Stroke Neg Hx   . COPD Neg Hx    Social History   Tobacco Use  . Smoking status: Current Every Day Smoker    Packs/day: 0.25    Years: 44.00    Pack years: 11.00    Types: Cigarettes  . Smokeless tobacco: Never Used  Substance Use Topics  . Alcohol use: Yes    Alcohol/week: 3.0 standard drinks    Types: 3 Glasses of wine per week    Comment: a couple glasses of wine a week  . Drug use: No     [facility] Visit from 10/14/2018 in [facility]  AUDIT-C Score  3      Interim medical history since last visit reviewed. Allergies  and medications reviewed  Review of Systems Per HPI unless specifically indicated above     Objective:    BP 118/72   Pulse 90   Temp 98 F (36.7 C)   Ht _0  (1.626 m)   Wt 170 lb 11.2 oz (77.4 kg)   SpO2 99%   BMI 29.30 kg/m   Wt Readings from Last 3 Encounters:  10/14/18 170 lb 11.2 oz (77.4 kg)  09/30/18 155 lb (70.3 kg)  04/09/18 165 lb 14.4 oz (75.3 kg)   MD note: doubt the veracity of the Nov 4th weight  Physical Exam  Constitutional: He appears well-developed and well-nourished. No distress.  Eyes: No scleral icterus.  Cardiovascular: Normal rate and regular rhythm.  Pulmonary/Chest: Effort normal and breath sounds normal.  Neurological: He is alert.  Skin: No pallor.  Psychiatric: He has a normal mood and affect.       Assessment & Plan:   Problem List Items Addressed This Visit      Cardiovascular and Mediastinum   Thoracic aortic atherosclerosis ([place]) (Chronic)    Try to get LDL under 70; smoking can accelerate plaque  formation; he is not interested in a statin, concerned about alzheimer's      Coronary artery calcification (Chronic)    No chest pain; saw cardiologist and had stress tests; symptom free      Relevant Orders   Lipid panel (Completed)   Atherosclerosis of coronary artery (Chronic)    Check lipids today; non-fasting      Relevant Orders   Lipid panel (Completed)     Digestive   Celiac disease    Gluten free and asx        Musculoskeletal and Integument   Rosacea   Relevant Medications   doxycycline (VIBRA-TABS) 100 MG tablet     Other   Tobacco abuse    Not ready to quit       Other Visit Diagnoses    Renal lesion    -  Primary   Relevant Orders   Urinalysis w microscopic + reflex cultur   US Renal (Completed)   Comprehensive metabolic panel (Completed)       Follow up plan: Return for Medicare Wellness check when due.  An after-visit summary was printed and given to the patient at [place].  Please see the patient instructions which may contain other information and recommendations beyond what is mentioned above in the assessment and plan.  Meds ordered this encounter  Medications  . doxycycline (VIBRA-TABS) 100 MG tablet    Sig: Take 1 tablet (100 mg total) by mouth 2 (two) times daily.    Dispense:  10 tablet    Refill:  5    Orders Placed This Encounter  Procedures  . US Renal  . Urinalysis w microscopic + reflex cultur  . Comprehensive metabolic panel  . Lipid panel  . Urinalysis w microscopic + reflex cultur  . REFLEXIVE URINE CULTURE

## 2018-10-14 NOTE — Assessment & Plan Note (Signed)
Try to get LDL under 70; smoking can accelerate plaque formation; he is not interested in a statin, concerned about alzheimer's

## 2018-10-14 NOTE — Assessment & Plan Note (Signed)
Not ready to quit

## 2018-10-14 NOTE — Assessment & Plan Note (Signed)
Check lipids today; non-fasting

## 2018-10-14 NOTE — Assessment & Plan Note (Signed)
Gluten free and asx

## 2018-10-14 NOTE — Patient Instructions (Addendum)
Please call [phone] to schedule your imaging test (renal ultrasound) Please wait 2-3 days after the order has been placed to call and get your test scheduled Please get a urine test in the next few days  I do encourage you to quit smoking Call [phone] to sign up for smoking cessation classes You can call 1-800-QUIT-NOW to talk with a smoking cessation coach

## 2018-10-15 LAB — URINALYSIS W MICROSCOPIC + REFLEX CULTURE
BACTERIA UA: NONE SEEN /HPF
Bilirubin Urine: NEGATIVE
Glucose, UA: NEGATIVE
HGB URINE DIPSTICK: NEGATIVE
HYALINE CAST: NONE SEEN /LPF
KETONES UR: NEGATIVE
Leukocyte Esterase: NEGATIVE
Nitrites, Initial: NEGATIVE
PH: 7.5 (ref 5.0–8.0)
PROTEIN: NEGATIVE
RBC / HPF: NONE SEEN /HPF (ref 0–2)
SQUAMOUS EPITHELIAL / LPF: NONE SEEN /HPF (ref <=5)
Specific Gravity, Urine: 1.011 (ref 1.001–1.03)
WBC UA: NONE SEEN /HPF (ref 0–5)

## 2018-10-15 LAB — COMPREHENSIVE METABOLIC PANEL
AG RATIO: 2.2 (calc) (ref 1.0–2.5)
ALT: 28 U/L (ref 9–46)
AST: 17 U/L (ref 10–35)
Albumin: 4.3 g/dL (ref 3.6–5.1)
Alkaline phosphatase (APISO): 75 U/L (ref 40–115)
BUN: 12 mg/dL (ref 7–25)
CO2: 28 mmol/L (ref 20–32)
Calcium: 9.9 mg/dL (ref 8.6–10.3)
Chloride: 106 mmol/L (ref 98–110)
Creat: 0.79 mg/dL (ref 0.70–1.25)
Globulin: 2 g/dL (calc) (ref 1.9–3.7)
Glucose, Bld: 80 mg/dL (ref 65–99)
Potassium: 4.2 mmol/L (ref 3.5–5.3)
SODIUM: 140 mmol/L (ref 135–146)
Total Bilirubin: 0.4 mg/dL (ref 0.2–1.2)
Total Protein: 6.3 g/dL (ref 6.1–8.1)

## 2018-10-15 LAB — LIPID PANEL
CHOL/HDL RATIO: 4.5 (calc) (ref <5.0)
Cholesterol: 226 mg/dL — ABNORMAL HIGH (ref <200)
HDL: 50 mg/dL (ref >40)
LDL CHOLESTEROL (CALC): 152 mg/dL — AB
NON-HDL CHOLESTEROL (CALC): 176 mg/dL — AB (ref <130)
TRIGLYCERIDES: 119 mg/dL (ref <150)

## 2018-10-15 LAB — NO CULTURE INDICATED

## 2018-10-16 ENCOUNTER — Ambulatory Visit
Admission: RE | Admit: 2018-10-16 | Discharge: 2018-10-16 | Disposition: A | Discharge status: Home / Self Care | Payer: Medicare Other | Source: Ambulatory Visit | Attending: Family Medicine | Admitting: Family Medicine

## 2018-10-16 DIAGNOSIS — N289 Disorder of kidney and ureter, unspecified: Secondary | ICD-10-CM | POA: Diagnosis present

## 2018-10-16 DIAGNOSIS — N281 Cyst of kidney, acquired: Secondary | ICD-10-CM | POA: Diagnosis not present

## 2018-10-16 DIAGNOSIS — N400 Enlarged prostate without lower urinary tract symptoms: Secondary | ICD-10-CM | POA: Diagnosis not present

## 2018-10-23 ENCOUNTER — Other Ambulatory Visit: Payer: Self-pay | Admitting: Family Medicine

## 2018-10-23 DIAGNOSIS — N281 Cyst of kidney, acquired: Secondary | ICD-10-CM

## 2018-10-23 DIAGNOSIS — R972 Elevated prostate specific antigen [PSA]: Secondary | ICD-10-CM

## 2018-10-23 DIAGNOSIS — N400 Enlarged prostate without lower urinary tract symptoms: Secondary | ICD-10-CM

## 2018-10-23 DIAGNOSIS — N289 Disorder of kidney and ureter, unspecified: Secondary | ICD-10-CM

## 2018-10-23 NOTE — Progress Notes (Signed)
I called to review his [place] results with him; reached voicemail I will try him on Friday

## 2018-10-26 ENCOUNTER — Encounter: Payer: Self-pay | Admitting: Family Medicine

## 2018-10-28 ENCOUNTER — Telehealth: Payer: Self-pay

## 2018-10-28 NOTE — Telephone Encounter (Signed)
Copied from [place] [id number]. Topic: Quick Communication - See Telephone Encounter >> Oct 28, 2018  9:05 AM Cecelia Byars, NT wrote: CRM for notification. See Telephone encounter for: 10/28/18. Patient called and said the practice called to give results and he would would like another call back as soon as possible

## 2018-10-28 NOTE — Telephone Encounter (Signed)
Pt called back, notified about [place] and referral.  But would like to discuss

## 2018-10-28 NOTE — Telephone Encounter (Signed)
I spoke with patient, explained findings, enlarged prostate, minimally complicated cystic lesion Explained referral to urologist

## 2018-11-12 ENCOUNTER — Encounter: Payer: Self-pay | Admitting: Urology

## 2018-11-12 ENCOUNTER — Other Ambulatory Visit: Payer: Self-pay

## 2018-11-12 ENCOUNTER — Ambulatory Visit (INDEPENDENT_AMBULATORY_CARE_PROVIDER_SITE_OTHER): Payer: Medicare Other | Admitting: Urology

## 2018-11-12 VITALS — BP 121/83 | HR 84 | Wt 168.0 lb

## 2018-11-12 DIAGNOSIS — M6530 Trigger finger, unspecified finger: Secondary | ICD-10-CM | POA: Insufficient documentation

## 2018-11-12 DIAGNOSIS — N400 Enlarged prostate without lower urinary tract symptoms: Secondary | ICD-10-CM | POA: Insufficient documentation

## 2018-11-12 DIAGNOSIS — N289 Disorder of kidney and ureter, unspecified: Secondary | ICD-10-CM | POA: Diagnosis not present

## 2018-11-12 LAB — MICROSCOPIC EXAMINATION
EPITHELIAL CELLS (NON RENAL): NONE SEEN /HPF (ref 0–10)
WBC UA: NONE SEEN /HPF (ref 0–5)

## 2018-11-12 LAB — URINALYSIS, COMPLETE
BILIRUBIN UA: NEGATIVE
Glucose, UA: NEGATIVE
KETONES UA: NEGATIVE
Leukocytes, UA: NEGATIVE
Nitrite, UA: NEGATIVE
PH UA: 7 (ref 5.0–7.5)
Protein, UA: NEGATIVE
RBC UA: NEGATIVE
SPEC GRAV UA: 1.015 (ref 1.005–1.030)
UUROB: 0.2 mg/dL (ref 0.2–1.0)

## 2018-11-12 LAB — BLADDER SCAN AMB NON-IMAGING

## 2018-11-12 NOTE — Progress Notes (Signed)
11/12/2018 8:58 AM   [name] 09/24/1951 [id number]  Referring provider: Arnetha Courser, MD [address] [place], [place] [address]  CC: Right renal cyst, enlarged prostate  HPI: I saw Mr. Summerlin in urology clinic today in consultation for right renal cyst and enlarged prostate from Dr. Sanda Klein.  He is a 67 year old smoker who is otherwise healthy who was found to have a 4 cm simple appearing right renal cyst on a noncontrast CT for lung cancer screening.  This was followed up with a renal ultrasound which showed a fluid-filled simple appearing 4 cm renal cyst.  This ultrasound also showed enlargement of the prostate to 59 cc.  He does report a history of a negative prostate biopsy approximately 10 years ago in [place], which reportedly showed prostatitis.  He does not know what his PSA was at time of biopsy.  These records are unavailable to me.  After his experience with a negative prostate biopsy, he had elected to discontinue PSA screening.  He denies a family history of prostate cancer.  He denies gross hematuria.  He denies any flank pain or urinary symptoms.  He has mild nocturia 1-2 times per night but is minimally bothered by this.  There are no aggravating or alleviating factors.  Severity is mild  IPSS score today is 7, with quality of life delighted.  PVR is 112 cc.   PMH: Past Medical History:  Diagnosis Date  . Atherosclerosis of coronary artery 09/28/2016   Chest CT Nov 2017  . Elevated C-reactive protein (CRP)   . Elevated PSA   . Fractured sternum 1999  . Herpes   . History of rib fracture 1999   multiple  . Hyperlipidemia   . Psoriasis   . Rheumatoid factor positive April 2015  . Rosacea   . Thoracic aortic atherosclerosis ([place]) 09/28/2016   Chest CT Nov 2017  . Tobacco abuse   . Vitamin D deficiency disease     Surgical History: Past Surgical History:  Procedure Laterality Date  . APPENDECTOMY  1974  . SHOULDER ARTHROSCOPY  10/23/2016   Procedure: ARTHROSCOPY SHOULDER WITH DISTAL CLAVICLE EXCISION, PARTIAL ACROMIONECTOMY, AND DEBRIDEMENT;  Surgeon: Earnestine Leys, MD;  Location: [facility] ORS;  Service: Orthopedics;;  . TIBIA FRACTURE SURGERY Left 2004   titanium rod, ORIF    Home Medications:  Allergies as of 11/12/2018   No Known Allergies     Medication List       Accurate as of November 12, 2018  8:58 AM. Always use your most recent med list.        doxycycline 100 MG tablet Commonly known as:  VIBRA-TABS Take 1 tablet (100 mg total) by mouth 2 (two) times daily.   metroNIDAZOLE 1 % gel Commonly known as:  METROGEL Apply topically daily.   valACYclovir 500 MG tablet Commonly known as:  VALTREX Take 1 tablet (500 mg total) by mouth 2 (two) times daily as needed.       Allergies: No Known Allergies  Family History: Family History  Problem Relation Age of Onset  . Heart disease Father   . Osteoarthritis Father   . Alzheimer's disease Father   . Cancer Neg Hx   . Diabetes Neg Hx   . Hypertension Neg Hx   . Stroke Neg Hx   . COPD Neg Hx     Social History:  reports that he has been smoking cigarettes. He has a 11.00 pack-year smoking history. He has never used smokeless  tobacco. He reports current alcohol use of about 3.0 standard drinks of alcohol per week. He reports that he does not use drugs.  ROS: Please see flowsheet from today's date for complete review of systems.  Physical Exam: BP 121/83   Pulse 84   Wt 168 lb (76.2 kg)   BMI 28.84 kg/m    Constitutional:  Alert and oriented, No acute distress. Cardiovascular: No clubbing, cyanosis, or edema. Respiratory: Normal respiratory effort, no increased work of breathing. GI: Abdomen is soft, nontender, nondistended, no abdominal masses GU: No CVA tenderness DRE: Patient refused Lymph: No cervical or inguinal lymphadenopathy. Skin: No rashes, bruises or suspicious lesions. Neurologic: Grossly intact, no focal deficits, moving all 4  extremities. Psychiatric: Normal mood and affect.  Laboratory Data: No recent PSA values Creatinine 0.79 Urinalysis today 0 WBCs, 0-2 RBCs, few bacteria, nitrite negative  Pertinent Imaging: I have personally reviewed the renal ultrasound.  There is a right 4 cm simple fluid-filled renal cyst.  Assessment & Plan:   In summary, Mr. Cowin is a healthy 67 year old male with history of negative prostate biopsy 10 years prior, and a simple right renal cyst.  He has not had PSA screening since that time.  We discussed the risks and benefits of screening at length, and he would like to follow-up in 6 months with his PCP for repeat PSA.  He will contact us if this is elevated or if he desires further PSA screening.  No further follow-up is needed for the right renal cyst and we discussed this at length today.  Finally, we discussed strategies to minimize nocturia including minimizing fluids in the evening, and double voiding prior to bed.  We discussed there is no correlation between the size of the prostate and obstructive symptoms, or cancer.  I recommended DRE today, but patient refused.  Follow-up with PCP in June 2020, he is amenable to PSA at that time  Nickolas Madrid, MD 11/12/2018  [place] [address] [place], [place] [address] [phone]

## 2018-11-15 ENCOUNTER — Ambulatory Visit (INDEPENDENT_AMBULATORY_CARE_PROVIDER_SITE_OTHER): Payer: Medicare Other | Admitting: Family Medicine

## 2018-11-15 ENCOUNTER — Encounter: Payer: Self-pay | Admitting: Family Medicine

## 2018-11-15 VITALS — BP 122/78 | HR 96 | Temp 98.1°F | Ht 64.5 in | Wt 167.1 lb

## 2018-11-15 DIAGNOSIS — Z720 Tobacco use: Secondary | ICD-10-CM

## 2018-11-15 DIAGNOSIS — Z0000 Encounter for general adult medical examination without abnormal findings: Secondary | ICD-10-CM | POA: Diagnosis not present

## 2018-11-15 NOTE — Assessment & Plan Note (Signed)
USPSTF grade A and B recommendations reviewed with patient; age-appropriate recommendations, preventive care, screening tests, etc discussed and encouraged; healthy living encouraged; see AVS for patient education given to patient  

## 2018-11-15 NOTE — Patient Instructions (Addendum)
Consider getting the new shingles vaccine called Shingrix; that is available for individuals 65 years of age and older, and is recommended even if you have had shingles in the past and/or already received the old shingles vaccine (Zostavax); it is a two-part series, and is available at many local pharmacies  Health Maintenance  Topic Date Due  . INFLUENZA VACCINE  02/25/2019 (Originally 06/27/2018)  . TETANUS/TDAP  10/15/2019 (Originally 11/28/2015)  . Hepatitis C Screening  10/15/2019 (Originally Jan 30, 1951)  . HIV Screening  10/15/2019 (Originally 05/24/1968)  . PNA vac Low Risk Adult (1 of 2 - PCV13) 10/15/2019 (Originally 05/24/2018)  . Fecal DNA (Cologuard)  05/28/2020    Health Maintenance, Male A healthy lifestyle and preventive care is important for your health and wellness. Ask your health care provider about what schedule of regular examinations is right for you. What should I know about weight and diet? Eat a Healthy Diet  Eat plenty of vegetables, fruits, whole grains, low-fat dairy products, and lean protein.  Do not eat a lot of foods high in solid fats, added sugars, or salt.  Maintain a Healthy Weight Regular exercise can help you achieve or maintain a healthy weight. You should:  Do at least 150 minutes of exercise each week. The exercise should increase your heart rate and make you sweat (moderate-intensity exercise).  Do strength-training exercises at least twice a week. Watch Your Levels of Cholesterol and Blood Lipids  Have your blood tested for lipids and cholesterol every 5 years starting at 65 years of age. If you are at high risk for heart disease, you should start having your blood tested when you are 65 years old. You may need to have your cholesterol levels checked more often if: ? Your lipid or cholesterol levels are high. ? You are older than 65 years of age. ? You are at high risk for heart disease. What should I know about cancer screening? Many types of  cancers can be detected early and may often be prevented. Lung Cancer  You should be screened every year for lung cancer if: ? You are a current smoker who has smoked for at least 30 years. ? You are a former smoker who has quit within the past 15 years.  Talk to your health care provider about your screening options, when you should start screening, and how often you should be screened. Colorectal Cancer  Routine colorectal cancer screening usually begins at 65 years of age and should be repeated every 5-10 years until you are 65 years old. You may need to be screened more often if early forms of precancerous polyps or small growths are found. Your health care provider may recommend screening at an earlier age if you have risk factors for colon cancer.  Your health care provider may recommend using home test kits to check for hidden blood in the stool.  A small camera at the end of a tube can be used to examine your colon (sigmoidoscopy or colonoscopy). This checks for the earliest forms of colorectal cancer. Prostate and Testicular Cancer  Depending on your age and overall health, your health care provider may do certain tests to screen for prostate and testicular cancer.  Talk to your health care provider about any symptoms or concerns you have about testicular or prostate cancer. Skin Cancer  Check your skin from head to toe regularly.  Tell your health care provider about any new moles or changes in moles, especially if: ? There is a change  in a mole's size, shape, or color. ? You have a mole that is larger than a pencil eraser.  Always use sunscreen. Apply sunscreen liberally and repeat throughout the day.  Protect yourself by wearing long sleeves, pants, a wide-brimmed hat, and sunglasses when outside. What should I know about heart disease, diabetes, and high blood pressure?  If you are 58-38 years of age, have your blood pressure checked every 3-5 years. If you are 58 years  of age or older, have your blood pressure checked every year. You should have your blood pressure measured twice-once when you are at a hospital or clinic, and once when you are not at a hospital or clinic. Record the average of the two measurements. To check your blood pressure when you are not at a hospital or clinic, you can use: ? An automated blood pressure machine at a pharmacy. ? A home blood pressure monitor.  Talk to your health care provider about your target blood pressure.  If you are between 47-30 years old, ask your health care provider if you should take aspirin to prevent heart disease.  Have regular diabetes screenings by checking your fasting blood sugar level. ? If you are at a normal weight and have a low risk for diabetes, have this test once every three years after the age of 85. ? If you are overweight and have a high risk for diabetes, consider being tested at a younger age or more often.  A one-time screening for abdominal aortic aneurysm (AAA) by ultrasound is recommended for men aged 5-75 years who are current or former smokers. What should I know about preventing infection? Hepatitis B If you have a higher risk for hepatitis B, you should be screened for this virus. Talk with your health care provider to find out if you are at risk for hepatitis B infection. Hepatitis C Blood testing is recommended for:  Everyone born from [birth year] through 1963.  Anyone with known risk factors for hepatitis C. Sexually Transmitted Diseases (STDs)  You should be screened each year for STDs including gonorrhea and chlamydia if: ? You are sexually active and are younger than 65 years of age. ? You are older than 64 years of age and your health care provider tells you that you are at risk for this type of infection. ? Your sexual activity has changed since you were last screened and you are at an increased risk for chlamydia or gonorrhea. Ask your health care provider if you are at  risk.  Talk with your health care provider about whether you are at high risk of being infected with HIV. Your health care provider may recommend a prescription medicine to help prevent HIV infection. What else can I do?  Schedule regular health, dental, and eye exams.  Stay current with your vaccines (immunizations).  Do not use any tobacco products, such as cigarettes, chewing tobacco, and e-cigarettes. If you need help quitting, ask your health care provider.  Limit alcohol intake to no more than 2 drinks per day. One drink equals 12 ounces of beer, 5 ounces of wine, or 1 ounces of hard liquor.  Do not use street drugs.  Do not share needles.  Ask your health care provider for help if you need support or information about quitting drugs.  Tell your health care provider if you often feel depressed.  Tell your health care provider if you have ever been abused or do not feel safe at home. This information is not  intended to replace advice given to you by your health care provider. Make sure you discuss any questions you have with your health care provider. Document Released: 05/11/2008 Document Revised: 07/12/2016 Document Reviewed: 08/17/2015 Elsevier Interactive Patient Education  2019 [employer] Maintenance After Age 14 After age 65, you are at a higher risk for certain long-term diseases and infections as well as injuries from falls. Falls are a major cause of broken bones and head injuries in people who are older than age 16. Getting regular preventive care can help to keep you healthy and well. Preventive care includes getting regular testing and making lifestyle changes as recommended by your health care provider. Talk with your health care provider about:  Which screenings and tests you should have. A screening is a test that checks for a disease when you have no symptoms.  A diet and exercise plan that is right for you. What should I know about screenings and  tests to prevent falls? Screening and testing are the best ways to find a health problem early. Early diagnosis and treatment give you the best chance of managing medical conditions that are common after age 61. Certain conditions and lifestyle choices may make you more likely to have a fall. Your health care provider may recommend:  Regular vision checks. Poor vision and conditions such as cataracts can make you more likely to have a fall. If you wear glasses, make sure to get your prescription updated if your vision changes.  Medicine review. Work with your health care provider to regularly review all of the medicines you are taking, including over-the-counter medicines. Ask your health care provider about any side effects that may make you more likely to have a fall. Tell your health care provider if any medicines that you take make you feel dizzy or sleepy.  Osteoporosis screening. Osteoporosis is a condition that causes the bones to get weaker. This can make the bones weak and cause them to break more easily.  Blood pressure screening. Blood pressure changes and medicines to control blood pressure can make you feel dizzy.  Strength and balance checks. Your health care provider may recommend certain tests to check your strength and balance while standing, walking, or changing positions.  Foot health exam. Foot pain and numbness, as well as not wearing proper footwear, can make you more likely to have a fall.  Depression screening. You may be more likely to have a fall if you have a fear of falling, feel emotionally low, or feel unable to do activities that you used to do.  Alcohol use screening. Using too much alcohol can affect your balance and may make you more likely to have a fall. What actions can I take to lower my risk of falls? General instructions  Talk with your health care provider about your risks for falling. Tell your health care provider if: ? You fall. Be sure to tell your  health care provider about all falls, even ones that seem minor. ? You feel dizzy, sleepy, or off-balance.  Take over-the-counter and prescription medicines only as told by your health care provider. These include any supplements.  Eat a healthy diet and maintain a healthy weight. A healthy diet includes low-fat dairy products, low-fat (lean) meats, and fiber from whole grains, beans, and lots of fruits and vegetables. Home safety  Remove any tripping hazards, such as rugs, cords, and clutter.  Install safety equipment such as grab bars in bathrooms and safety rails on stairs.  Keep rooms and walkways well-lit. Activity   Follow a regular exercise program to stay fit. This will help you maintain your balance. Ask your health care provider what types of exercise are appropriate for you.  If you need a cane or walker, use it as recommended by your health care provider.  Wear supportive shoes that have nonskid soles. Lifestyle  Do not drink alcohol if your health care provider tells you not to drink.  If you drink alcohol, limit how much you have: ? 0-1 drink a day for women. ? 0-2 drinks a day for men.  Be aware of how much alcohol is in your drink. In the [place] one drink equals one typical bottle of beer (12 oz), one-half glass of wine (5 oz), or one shot of hard liquor (1 oz).  Do not use any products that contain nicotine or tobacco, such as cigarettes and e-cigarettes. If you need help quitting, ask your health care provider. Summary  Having a healthy lifestyle and getting preventive care can help to protect your health and wellness after age 61.  Screening and testing are the best way to find a health problem early and help you avoid having a fall. Early diagnosis and treatment give you the best chance for managing medical conditions that are more common for people who are older than age 21.  Falls are a major cause of broken bones and head injuries in people who are older  than age 56. Take precautions to prevent a fall at home.  Work with your health care provider to learn what changes you can make to improve your health and wellness and to prevent falls. This information is not intended to replace advice given to you by your health care provider. Make sure you discuss any questions you have with your health care provider. Document Released: 09/26/2017 Document Revised: 09/26/2017 Document Reviewed: 09/26/2017 Elsevier Interactive Patient Education  2019 [employer].

## 2018-11-15 NOTE — Progress Notes (Signed)
BP 122/78   Pulse 96   Temp 98.1 F (36.7 C) (Oral)   Ht 5' 4.5" (1.638 m)   Wt 167 lb 1.6 oz (75.8 kg)   SpO2 98%   BMI 28.24 kg/m    Subjective:    Patient ID: Andres Mullins, male    DOB: Mar 29, 1951, 67 y.o.   MRN: [id number]  HPI: Andres Mullins is a 67 y.o. male  Chief Complaint  Patient presents with  . Welcome to medicare    HPI Patient is here for his welcome to medicare visit USPSTF grade A and B recommendations Depression:  Depression screen [facility] 2/9 11/15/2018 10/14/2018 05/18/2017 10/17/2016 10/05/2016  Decreased Interest 0 0 0 0 0  Down, Depressed, Hopeless 0 0 0 0 0  PHQ - 2 Score 0 0 0 0 0  Altered sleeping 0 0 - - -  Tired, decreased energy 0 0 - - -  Change in appetite 0 0 - - -  Feeling bad or failure about yourself  0 0 - - -  Trouble concentrating 0 0 - - -  Moving slowly or fidgety/restless 0 0 - - -  Suicidal thoughts 0 0 - - -  PHQ-9 Score 0 0 - - -  Difficult doing work/chores Not difficult at all Not difficult at all - - -   Hypertension: BP Readings from Last 3 Encounters:  11/15/18 122/78  11/12/18 121/83  10/14/18 118/72   Obesity: it's the holidays, he says, five pounds too much Wt Readings from Last 3 Encounters:  11/15/18 167 lb 1.6 oz (75.8 kg)  11/12/18 168 lb (76.2 kg)  10/14/18 170 lb 11.2 oz (77.4 kg)   BMI Readings from Last 3 Encounters:  11/15/18 28.24 kg/m  11/12/18 28.84 kg/m  10/14/18 29.30 kg/m    HCPOA is wife Living will; no heroic measures desired, no intubation, no feeding tubes; he will bring copy  Fall prevention discussed; redid bathroom, took tub and put in walk-in tile shower with handrails, master bedroom on first floor; no carpets on first floor; no cords; handrails on the stairs; good lighting No hearing loss Passed get up and go test  EKG: unchanged from prior with cardiologist Immunizations: discussed shingrix, tetanus, does not get flu shots, he'll take next year's flu shot; not much faith;  he gets sick anyway; discussed PCV-13, he is not interested, trying to keep down number of vaccines Skin cancer: no worrisome moles; goes to [facility] yearly Lung cancer:  Current smoker; getting screened soon, low dose chest CT soon, doing that for 3-4 years Prostate cancer: just saw urologist; he wants to do PSA next visit No results found for: PSA Colorectal cancer: UTD, due 2021 July AAA: no fam hx; current smoker Aspirin: not taking on his own; he does not want to take it, "I don't want to mess up my blood"; he is aware of its use for cardiac and stroke prevention Diet: not much processed food Exercise: nothing really regular, 3x a week goes for a walk or some physical activity, kayaks Alcohol:    Office Visit from 11/15/2018 in [facility]  AUDIT-C Score  3    Tobacco use: current smoker HIV, hep B, hep C: has had it already STD testing and prevention (chl/gon/syphilis): n/a Glucose:  Glucose, Bld  Date Value Ref Range Status  10/14/2018 80 65 - 99 mg/dL Final    Comment:    .  Fasting reference interval .   05/18/2017 83 65 - 99 mg/dL Final  10/06/2016 80 65 - 99 mg/dL Final   Lipids: he is not interested in doing the medicine, last lipids were high; father had alzheimers, he is thinking he may develop alzheimers; if he had heart problems, he might take it; his dad started statin and went downhill fast  Lab Results  Component Value Date   CHOL 226 (H) 10/14/2018   CHOL 207 (H) 10/06/2016   CHOL CANCELED 10/05/2016   Lab Results  Component Value Date   HDL 50 10/14/2018   HDL 46 10/06/2016   HDL CANCELED 10/05/2016   Lab Results  Component Value Date   LDLCALC 152 (H) 10/14/2018   LDLCALC 138 (H) 10/06/2016   [place] CANCELED 10/05/2016   Lab Results  Component Value Date   TRIG 119 10/14/2018   TRIG 112 10/06/2016   TRIG CANCELED 10/05/2016   Lab Results  Component Value Date   CHOLHDL 4.5 10/14/2018   CHOLHDL 4.5  10/06/2016   CHOLHDL CANCELED 10/05/2016   No results found for: LDLDIRECT   Depression screen [facility] 2/9 11/15/2018 10/14/2018 05/18/2017 10/17/2016 10/05/2016  Decreased Interest 0 0 0 0 0  Down, Depressed, Hopeless 0 0 0 0 0  PHQ - 2 Score 0 0 0 0 0  Altered sleeping 0 0 - - -  Tired, decreased energy 0 0 - - -  Change in appetite 0 0 - - -  Feeling bad or failure about yourself  0 0 - - -  Trouble concentrating 0 0 - - -  Moving slowly or fidgety/restless 0 0 - - -  Suicidal thoughts 0 0 - - -  PHQ-9 Score 0 0 - - -  Difficult doing work/chores Not difficult at all Not difficult at all - - -   Fall Risk  11/15/2018 10/14/2018 05/18/2017 10/17/2016 10/05/2016  Falls in the past year? 0 0 No No No  Number falls in past yr: 0 - - - -  Injury with Fall? 0 - - - -    Relevant past medical, surgical, family and social history reviewed Past Medical History:  Diagnosis Date  . Atherosclerosis of coronary artery 09/28/2016   Chest CT Nov 2017  . Elevated C-reactive protein (CRP)   . Elevated PSA   . Fractured sternum 1999  . Herpes   . History of rib fracture 1999   multiple  . Hyperlipidemia   . Psoriasis   . Rheumatoid factor positive April 2015  . Rosacea   . Thoracic aortic atherosclerosis ([place]) 09/28/2016   Chest CT Nov 2017  . Tobacco abuse   . Vitamin D deficiency disease    Past Surgical History:  Procedure Laterality Date  . APPENDECTOMY  1974  . SHOULDER ARTHROSCOPY  10/23/2016   Procedure: ARTHROSCOPY SHOULDER WITH DISTAL CLAVICLE EXCISION, PARTIAL ACROMIONECTOMY, AND DEBRIDEMENT;  Surgeon: Earnestine Leys, MD;  Location: [facility] ORS;  Service: Orthopedics;;  . TIBIA FRACTURE SURGERY Left 2004   titanium rod, ORIF   Family History  Problem Relation Age of Onset  . Heart disease Father   . Osteoarthritis Father   . Alzheimer's disease Father   . Cancer Neg Hx   . Diabetes Neg Hx   . Hypertension Neg Hx   . Stroke Neg Hx   . COPD Neg Hx    Social History    Tobacco Use  . Smoking status: Current Every Day Smoker    Packs/day: 0.25  Years: 44.00    Pack years: 11.00    Types: Cigarettes  . Smokeless tobacco: Never Used  Substance Use Topics  . Alcohol use: Yes    Alcohol/week: 3.0 standard drinks    Types: 3 Glasses of wine per week    Comment: a couple glasses of wine a week  . Drug use: No     [facility] Visit from 11/15/[facility] in [facility]  AUDIT-C Score  3      Interim medical history since last visit reviewed. Allergies and medications reviewed  Review of Systems  Constitutional: Negative for fever and unexpected weight change.  HENT: Negative for hearing loss.   Eyes: Negative for visual disturbance.  Respiratory: Negative for cough and wheezing.   Cardiovascular: Negative for chest pain and palpitations.  Gastrointestinal: Negative for blood in stool.  Genitourinary: Negative for hematuria.  Neurological: Negative for tremors.  Hematological: Negative for adenopathy. Does not bruise/bleed easily.   Per HPI unless specifically indicated above     Objective:    BP 122/78   Pulse 96   Temp 98.1 F (36.7 C) (Oral)   Ht 5' 4.5" (1.638 m)   Wt 167 lb 1.6 oz (75.8 kg)   SpO2 98%   BMI 28.24 kg/m   Wt Readings from Last 3 Encounters:  11/15/18 167 lb 1.6 oz (75.8 kg)  11/12/18 168 lb (76.2 kg)  10/14/18 170 lb 11.2 oz (77.4 kg)    Physical Exam Constitutional:      General: He is not in acute distress.    Appearance: He is well-developed. He is not diaphoretic.  HENT:     Head: Normocephalic and atraumatic.     Nose: Nose normal.  Eyes:     General: No scleral icterus. Neck:     Thyroid: No thyromegaly.     Vascular: No JVD.  Cardiovascular:     Rate and Rhythm: Normal rate and regular rhythm.     Heart sounds: Normal heart sounds.  Pulmonary:     Effort: Pulmonary effort is normal. No respiratory distress.     Breath sounds: Normal breath sounds. No wheezing or rales.  Abdominal:      General: Bowel sounds are normal. There is no distension.     Palpations: Abdomen is soft.     Tenderness: There is no abdominal tenderness. There is no guarding.  Genitourinary:    Comments: Declined by patient Musculoskeletal: Normal range of motion.  Lymphadenopathy:     Cervical: No cervical adenopathy.  Skin:    General: Skin is warm and dry.     Coloration: Skin is not pale.     Findings: No erythema or rash.  Neurological:     Mental Status: He is alert.     Motor: No abnormal muscle tone.     Coordination: Coordination normal.     Deep Tendon Reflexes: Reflexes normal.  Psychiatric:        Mood and Affect: Mood is not anxious or depressed.        Behavior: Behavior normal.        Thought Content: Thought content normal.        Judgment: Judgment normal.    6CIT Screen 11/15/2018  What Year? 0 points  What month? 0 points  What time? 0 points  Count back from 20 0 points  Months in reverse 0 points  Repeat phrase 0 points  Total Score 0    Results for orders placed or performed in visit  on 11/12/18  Microscopic Examination  Result Value Ref Range   WBC, UA None seen 0 - 5 /hpf   RBC, UA 0-2 0 - 2 /hpf   Epithelial Cells (non renal) None seen 0 - 10 /hpf   Bacteria, UA Few (A) None seen/Few  Urinalysis, Complete  Result Value Ref Range   Specific Gravity, UA 1.015 1.005 - 1.030   pH, UA 7.0 5.0 - 7.5   Color, UA Yellow Yellow   Appearance Ur Clear Clear   Leukocytes, UA Negative Negative   Protein, UA Negative Negative/Trace   Glucose, UA Negative Negative   Ketones, UA Negative Negative   RBC, UA Negative Negative   Bilirubin, UA Negative Negative   Urobilinogen, Ur 0.2 0.2 - 1.0 mg/dL   Nitrite, UA Negative Negative   Microscopic Examination See below:   Bladder Scan (Post Void Residual) in office  Result Value Ref Range   Scan Result 131m       Assessment & Plan:   Problem List Items Addressed This Visit      Other   Tobacco abuse    Relevant Orders   [place]roperitoneal Comp   Welcome to Medicare preventive visit - Primary    USPSTF grade A and B recommendations reviewed with patient; age-appropriate recommendations, preventive care, screening tests, etc discussed and encouraged; healthy living encouraged; see AVS for patient education given to patient       Relevant Orders   EKG 12-Lead      EKG reviewed; unchanged since prior with cardiologist  Follow up plan: Return in about 1 year (around 11/16/2019) for Medicare Wellness check; visit with Dr. LSanda Kleinafter that same day.  An after-visit summary was printed and given to the patient at [facility]  Please see the patient instructions which may contain other information and recommendations beyond what is mentioned above in the assessment and plan.  No orders of the defined types were placed in this encounter.   Orders Placed This Encounter  Procedures  . [place]roperitoneal Comp  . EKG 12-Lead

## 2018-11-22 ENCOUNTER — Telehealth: Payer: Self-pay | Admitting: Family Medicine

## 2018-11-22 DIAGNOSIS — Z136 Encounter for screening for cardiovascular disorders: Secondary | ICD-10-CM

## 2018-11-22 DIAGNOSIS — Z0000 Encounter for general adult medical examination without abnormal findings: Secondary | ICD-10-CM

## 2018-11-22 NOTE — Telephone Encounter (Signed)
Copied from [place] [id number]. Topic: Quick Communication - See Telephone Encounter >> Nov 22, 2018  2:42 PM Nils Flack wrote: CRM for notification. See Telephone encounter for: 11/22/18.Per scheduling, the order for [place] retroperitoneal should be [place] AAA Screening, IMG code is [id number]

## 2018-12-02 ENCOUNTER — Ambulatory Visit
Admission: RE | Admit: 2018-12-02 | Discharge: 2018-12-02 | Disposition: A | Discharge status: Home / Self Care | Payer: Medicare Other | Source: Ambulatory Visit | Attending: Family Medicine | Admitting: Family Medicine

## 2018-12-02 DIAGNOSIS — Z0000 Encounter for general adult medical examination without abnormal findings: Secondary | ICD-10-CM | POA: Insufficient documentation

## 2018-12-02 DIAGNOSIS — Z136 Encounter for screening for cardiovascular disorders: Secondary | ICD-10-CM | POA: Diagnosis present

## 2019-10-03 ENCOUNTER — Telehealth: Payer: Self-pay | Admitting: *Deleted

## 2019-10-03 DIAGNOSIS — Z122 Encounter for screening for malignant neoplasm of respiratory organs: Secondary | ICD-10-CM

## 2019-10-03 DIAGNOSIS — Z87891 Personal history of nicotine dependence: Secondary | ICD-10-CM

## 2019-10-03 NOTE — Telephone Encounter (Signed)
Patient has been notified that annual lung cancer screening low dose CT scan is due currently or will be in near future. Confirmed that patient is within the age range of 55-77, and asymptomatic, (no signs or symptoms of lung cancer). Patient denies illness that would prevent curative treatment for lung cancer if found. Verified smoking history, (current, 45.5 pack year). The shared decision making visit was done 09/26/16. Patient is agreeable for CT scan being scheduled.

## 2019-10-06 ENCOUNTER — Encounter: Payer: Self-pay | Admitting: Family Medicine

## 2019-10-06 ENCOUNTER — Ambulatory Visit (INDEPENDENT_AMBULATORY_CARE_PROVIDER_SITE_OTHER): Payer: Medicare Other | Admitting: Family Medicine

## 2019-10-06 VITALS — BP 104/68 | HR 85 | Temp 97.9°F | Resp 14 | Ht 65.0 in | Wt 169.3 lb

## 2019-10-06 DIAGNOSIS — I700 Atherosclerosis of aorta: Secondary | ICD-10-CM | POA: Diagnosis not present

## 2019-10-06 DIAGNOSIS — K900 Celiac disease: Secondary | ICD-10-CM | POA: Diagnosis not present

## 2019-10-06 DIAGNOSIS — D6851 Activated protein C resistance: Secondary | ICD-10-CM | POA: Insufficient documentation

## 2019-10-06 DIAGNOSIS — E559 Vitamin D deficiency, unspecified: Secondary | ICD-10-CM

## 2019-10-06 DIAGNOSIS — I499 Cardiac arrhythmia, unspecified: Secondary | ICD-10-CM

## 2019-10-06 DIAGNOSIS — I2510 Atherosclerotic heart disease of native coronary artery without angina pectoris: Secondary | ICD-10-CM | POA: Diagnosis not present

## 2019-10-06 DIAGNOSIS — I2584 Coronary atherosclerosis due to calcified coronary lesion: Secondary | ICD-10-CM

## 2019-10-06 DIAGNOSIS — E785 Hyperlipidemia, unspecified: Secondary | ICD-10-CM | POA: Diagnosis not present

## 2019-10-06 DIAGNOSIS — R0989 Other specified symptoms and signs involving the circulatory and respiratory systems: Secondary | ICD-10-CM

## 2019-10-06 DIAGNOSIS — Z720 Tobacco use: Secondary | ICD-10-CM

## 2019-10-06 NOTE — Progress Notes (Addendum)
Name: Andres Mullins   MRN: [id number]    DOB: 1951-08-18   Date:10/06/2019       Progress Note  Chief Complaint  Patient presents with   Follow-up   Subjective:   Andres Mullins is a 68 y.o. male, presents to clinic for routine follow up on the conditions listed above.  Hyperlipidemia: Current Medication Regimen:  No meds, has refused statins with med concerns, manages with healthy life. Last Lipids: Lab Results  Component Value Date   CHOL 226 (H) 10/14/2018   HDL 50 10/14/2018   LDLCALC 152 (H) 10/14/2018   TRIG 119 10/14/2018   CHOLHDL 4.5 10/14/2018  - Current Diet:  Tries with diet, avoids bad fats, no butter crisco lard - Denies: Chest pain, shortness of breath, myalgias. - Documented aortic atherosclerosis? Yes - Risk factors for atherosclerosis: arteriosclerotic heart disease, cerebral vascular disease, hypercholesterolemia and smoking The 10-year ASCVD risk score Mikey Bussing DC Jr., et al., 2013) is: 14.1%   Values used to calculate the score:     Age: 60 years     Sex: Male     Is Non-Hispanic African American: No     Diabetic: No     Tobacco smoker: Yes     Systolic Blood Pressure: 919 mmHg     Is BP treated: No     HDL Cholesterol: 50 mg/dL     Total Cholesterol: 226 mg/dL Today we reviewed his 10-year risk score and recommendations of statin medication.  He was given a handout from the [facility] about hyperlipidemia and about how statins work.  Did offer other medications besides statin because of medicines to help with his cholesterol.  He declined at this time.  Current Smoker: Low dose CT screening program - current smoker 1/2 ppd Has found some incidental dx with screenings including coronary artery disease, atherosclerosis of his thoracic aorta, also emphysema/COPD.  He declines any smoking cessation program or medications at this time.  States that he "paces himself" and has been trying to cut back on his own.  He denies any daily chronic  cough, any wheeze, shortness of breath, hemoptysis, denies any gradual progression of any exertional shortness of breath.  He states that his wife is 55 years younger than him they go on hikes and he has been able to keep up with her has not noticed any change in his breathing or endurance.  Denies any unintentional weight loss, night sweats, chest pain.  Denies any recurrent bronchitis.  He is due for his repeat low-dose CT scan tomorrow   Patient has a history of prostatitis and elevated PSA:   Prior Urology consult for renal cyst and hx of elevated PSA, saw Dr. Diamantina Providence in Dec 2019.  His assessment and plan is copied below.  I did review this with the patient discussing this follow-up that was to take place at primary care office to repeat the PSA.  He does not want to check the PSA lab again today and declines.  He denies any urinary symptoms.  Denies nocturia, weak urine stream, dribbling, double voiding, urge incontinence, straining to initiate urine stream.  Assessment & Plan:   In summary, Mr. Frommelt is a healthy 68 year old male with history of negative prostate biopsy 10 years prior, and a simple right renal cyst.  He has not had PSA screening since that time.  We discussed the risks and benefits of screening at length, and he would like to follow-up in 6 months with his  PCP for repeat PSA.  He will contact us if this is elevated or if he desires further PSA screening.  No further follow-up is needed for the right renal cyst and we discussed this at length today.  Finally, we discussed strategies to minimize nocturia including minimizing fluids in the evening, and double voiding prior to bed.  We discussed there is no correlation between the size of the prostate and obstructive symptoms, or cancer.  I recommended DRE today, but patient refused.  Follow-up with PCP in June 2020, he is amenable to PSA at that time  Nickolas Madrid, MD  PSA:   IPSS Questionnaire (AUA-7): Over the past month     1)  How often have you had a sensation of not emptying your bladder completely after you finish urinating?  0 - Not at all  2)  How often have you had to urinate again less than two hours after you finished urinating? 1 - Less than 1 time in 5  3)  How often have you found you stopped and started again several times when you urinated?  0 - Not at all  4) How difficult have you found it to postpone urination?  0 - Not at all  5) How often have you had a weak urinary stream?  1 - Less than 1 time in 5  6) How often have you had to push or strain to begin urination?  0 - Not at all  7) How many times did you most typically get up to urinate from the time you went to bed until the time you got up in the morning?  1 - 1 time  Total score:  0-7 mildly symptomatic   8-19 moderately symptomatic   20-35 severely symptomatic    In reviewing patient's health maintenance tab we discussed how he is due for pneumococcal vaccine series and today he declined stating that he "Didn't do well with the flu shot" he experienced some swelling to arm and lymph nodes and felt subjective fever.  I explained that that was a good immune response forming antibodies for him in response to this shot and encouraged him to get the pneumococcal series because of his emphysema.  He states he is never got pneumonia and he would like to wait and research to the vaccine series first.  Celiac disease:   Patient states that his celiac is well controlled with a gluten-free diet that is very strict about.  He is has normal stools denies any diarrhea, abdominal pain or bloating.  States he is never had a problem with malabsorption or weight loss.   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   Welcome to Medicare preventive visit 11/15/2018   Renal lesion 11/12/2018   Prostate enlargement 11/12/2018   Acquired trigger finger 11/12/2018   Trigger middle finger of right  hand 01/02/2018   Positive anti-CCP test 12/12/2017   Rheumatoid factor positive 12/12/2017   Trigger finger of both hands 12/12/2017   Bilateral hand pain 05/18/2017   Preventative health care 05/18/2017   Screen for colon cancer 05/18/2017   Coronary artery calcification 01/18/2017   Supraspinatus tendinitis, left 10/22/2016   Impingement syndrome of shoulder region 10/06/2016   Celiac disease 10/05/2016   Atherosclerosis of coronary artery 09/28/2016   Thoracic aortic atherosclerosis ([place]) 09/28/2016   Personal history of tobacco use, presenting hazards to health 09/26/2016   Lipoma of head 10/19/2015   Tendonitis,  Achilles, left 10/14/2015   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   Herpes    Rosacea    Psoriasis    Hyperlipidemia    Vitamin D deficiency disease    Tobacco abuse    Elevated PSA    Elevated C-reactive protein (CRP)     Past Surgical History:  Procedure Laterality Date   APPENDECTOMY  1974   SHOULDER ARTHROSCOPY  10/23/2016   Procedure: ARTHROSCOPY SHOULDER WITH DISTAL CLAVICLE EXCISION, PARTIAL ACROMIONECTOMY, AND DEBRIDEMENT;  Surgeon: Earnestine Leys, MD;  Location: [facility] ORS;  Service: Orthopedics;;   TIBIA FRACTURE SURGERY Left 2004   titanium rod, ORIF    Family History  Problem Relation Age of Onset   Heart disease Father    Osteoarthritis Father    Alzheimer's disease Father    Cancer Neg Hx    Diabetes Neg Hx    Hypertension Neg Hx    Stroke Neg Hx    COPD Neg Hx     Social History   Socioeconomic History   Marital status: Married    Spouse name: colleen   Number of children: Not on file   Years of education: 12   Highest education level: Bachelor's degree (e.g., BA, AB, BS)  Occupational History   Occupation: retired  Scientist, product/process development strain: Not hard at [employer]    Worry: Never true    Inability: Never true   Transportation needs    Medical: No     Non-medical: No  Tobacco Use   Smoking status: Current Every Day Smoker    Packs/day: 0.25    Years: 44.00    Pack years: 11.00    Types: Cigarettes   Smokeless tobacco: Never Used  Substance and Sexual Activity   Alcohol use: Yes    Alcohol/week: 3.0 standard drinks    Types: 3 Glasses of wine per week    Comment: a couple glasses of wine a week   Drug use: No   Sexual activity: Yes  Lifestyle   Physical activity    Days per week: 3 days    Minutes per session: 40 min   Stress: Not at all  Relationships   Social connections    Talks on phone: Once a week    Gets together: Once a week    Attends religious service: Never    Active member of club or organization: Yes    Attends meetings of clubs or organizations: More than 4 times per year    Relationship status: Married   Intimate partner violence    Fear of current or ex partner: No    Emotionally abused: No    Physically abused: No    Forced sexual activity: No  Other Topics Concern   Not on file  Social History Narrative   Consumes ~4 cups of coffee/day    Current Outpatient Medications:    doxycycline (VIBRA-TABS) 100 MG tablet, Take 1 tablet (100 mg total) by mouth 2 (two) times daily. (Patient taking differently: Take 100 mg by mouth as needed. ), Disp: 10 tablet, Rfl: 5   metroNIDAZOLE (METROGEL) 1 % gel, Apply topically daily. (Patient taking differently: Apply topically as needed. ), Disp: 45 g, Rfl: 1   valACYclovir (VALTREX) 500 MG tablet, Take 1 tablet (500 mg total) by mouth 2 (two) times daily as needed., Disp: 30 tablet, Rfl: 3  No Known Allergies  I personally reviewed active problem list, medication list, allergies, family history, social history, health maintenance,  notes from last encounter, lab results, imaging with the patient/caregiver today.  Review of Systems  Constitutional: Negative.  Negative for activity change, appetite change, fatigue and unexpected weight change.  HENT:  Negative.   Eyes: Negative.   Respiratory: Negative.  Negative for cough, choking, chest tightness and shortness of breath.   Cardiovascular: Negative.  Negative for chest pain, palpitations and leg swelling.       Denies orthopnea, PND, weight gain, exertional dyspnea, palpitations, near syncope  Gastrointestinal: Negative.  Negative for abdominal pain and blood in stool.  Endocrine: Negative.   Genitourinary: Negative.  Negative for decreased urine volume, difficulty urinating, testicular pain and urgency.  Musculoskeletal: Negative.   Skin: Negative.  Negative for color change and pallor.  Allergic/Immunologic: Negative.   Neurological: Negative.  Negative for syncope, weakness, light-headedness and numbness.  Psychiatric/Behavioral: Negative.  Negative for confusion, dysphoric mood, self-injury and suicidal ideas. The patient is not nervous/anxious.   All other systems reviewed and are negative.    Objective:    Vitals:   10/06/19 0912  BP: 104/68  Pulse: 85  Resp: 14  Temp: 97.9 F (36.6 C)  SpO2: 99%  Weight: 169 lb 4.8 oz (76.8 kg)  Height: 5' 5"  (1.651 m)    Body mass index is 28.17 kg/m.  Physical Exam Vitals signs and nursing note reviewed.  Constitutional:      General: He is not in acute distress.    Appearance: Normal appearance. He is well-developed. He is not ill-appearing, toxic-appearing or diaphoretic.     Interventions: Face mask in place.  HENT:     Head: Normocephalic and atraumatic.     Jaw: No trismus.     Right Ear: External ear normal.     Left Ear: External ear normal.  Eyes:     General: Lids are normal. No scleral icterus.    Conjunctiva/sclera: Conjunctivae normal.     Pupils: Pupils are equal, round, and reactive to light.  Neck:     Musculoskeletal: Normal range of motion and neck supple.     Trachea: Trachea and phonation normal. No tracheal deviation.  Cardiovascular:     Rate and Rhythm: Normal rate and regular rhythm. Frequent  extrasystoles are present.    Pulses: Normal pulses.          Radial pulses are 2+ on the right side and 2+ on the left side.       Posterior tibial pulses are 2+ on the right side and 2+ on the left side.     Heart sounds: Normal heart sounds. No murmur. No friction rub. No gallop.   Pulmonary:     Effort: Pulmonary effort is normal. No tachypnea, accessory muscle usage, prolonged expiration, respiratory distress or retractions.     Breath sounds: No stridor. Examination of the right-lower field reveals decreased breath sounds. Examination of the left-lower field reveals decreased breath sounds. Decreased breath sounds, rhonchi and rales present. No wheezing.     Comments: Scattered rhonchi to mid to lower lung fields, b/l lower lobes with fine rales R>L Abdominal:     General: Bowel sounds are normal. There is no distension.     Palpations: Abdomen is soft.     Tenderness: There is no abdominal tenderness. There is no guarding or rebound.  Musculoskeletal: Normal range of motion.     Right lower leg: No edema.     Left lower leg: No edema.  Skin:    General: Skin is warm and dry.  Capillary Refill: Capillary refill takes less than 2 seconds.     Coloration: Skin is not jaundiced.     Findings: No rash.     Nails: There is no clubbing.   Neurological:     Mental Status: He is alert.     Cranial Nerves: No dysarthria or facial asymmetry.     Motor: No tremor or abnormal muscle tone.     Gait: Gait normal.  Psychiatric:        Mood and Affect: Mood normal.        Speech: Speech normal.        Behavior: Behavior normal. Behavior is cooperative.       ECG interpretation   Date: 10/06/19  Rate: 69   Rhythm: sinus rhythm  QRS Axis: left axis  Intervals: normal  ST/T Wave abnormalities: non-specific T-wave abnormality (lead III TWI), left anterior fascicular block  Conduction Disutrbances: none  Old EKG Reviewed: Yes, reviewed ECG from 11/15/2018, No significant changes  noted  PHQ2/9: Depression screen [facility] 2/9 10/06/2019 11/15/2018 10/14/2018 05/18/2017 10/17/2016  Decreased Interest 0 0 0 0 0  Down, Depressed, Hopeless 0 0 0 0 0  PHQ - 2 Score 0 0 0 0 0  Altered sleeping 0 0 0 - -  Tired, decreased energy 0 0 0 - -  Change in appetite 0 0 0 - -  Feeling bad or failure about yourself  0 0 0 - -  Trouble concentrating 0 0 0 - -  Moving slowly or fidgety/restless 0 0 0 - -  Suicidal thoughts 0 0 0 - -  PHQ-9 Score 0 0 0 - -  Difficult doing work/chores Not difficult at all Not difficult at all Not difficult at all - -    phq 9 is negative - reviewed today   Fall Risk: Fall Risk  10/06/2019 11/15/2018 10/14/2018 05/18/2017 10/17/2016  Falls in the past year? 0 0 0 No No  Number falls in past yr: 0 0 - - -  Injury with Fall? 0 0 - - -    Assessment & Plan:       ICD-10-CM   1. Hyperlipidemia, unspecified hyperlipidemia type  E78.5 CMP w GFR    Lipid Panel   pt refuses statin, discussed ASCVD risk, lifestyle changes and other meds, but still declines, has no cardiac sx, recheck labs  2. Thoracic aortic atherosclerosis (HCC)  I70.0 CMP w GFR    Lipid Panel   see above  3. Coronary artery calcification  I25.10    I25.84    has consulted with cardiology in the past, he currently has no cardiac sx concerning for ACS  4. Celiac disease  K90.0 Vit D   well controlled with strict gluten free diet, no diarrhea, check labs and vit D  5. Vitamin D deficiency  E55.9 Vit D   recheck   6. Cardiac arrhythmia, unspecified cardiac arrhythmia type  I49.9 EKG 12-Lead   on exam multiple skipped beats thought to be frequent PAC/PVC every ~4 beats, EKG in clinic showed sinus rhythm, no change from prior   7. Tobacco abuse  Z72.0 CMP w GFR    Lipid Panel   cut back to 1/2 ppd - 1/4 ppd, doing low dose CT lung cancer screening - lung sounds abnormal to lower lobes, fine rales b/l, f/up by checking CT  8. Abnormal lung sounds  R09.89    to b/l lower lobes, fine  rales/crackles, worse on L than R, pt denies  any pulm sx, current smoker, CT chest tomorrow  9. 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 Rogue Bussing     Return in about 6 months (around 04/04/2020) for Medicare Well Visit/welcome to medicare and 6 month routine f/up.   [name], PA-C 10/06/19 4:31 PM   10/08/19 4:57 PM PE corrected to reflect abnormal lung exam findings that were noted in A&P, inadvertently used normal exam macro in PE section - updated today when reviewing note realized discrepancy/contradiction.  At the time of exam I did discuss with the patient his abnormal lung sounds he denied any symptoms as noted in HPI, we knew low dose CT was the following day, discussed following that imaging to see if any change from last two years CTs. at that time I thought his lungs did sound like idiopathic pulmonary fibrosis or bronchiolitis type infection.

## 2019-10-07 ENCOUNTER — Other Ambulatory Visit: Payer: Self-pay

## 2019-10-07 ENCOUNTER — Ambulatory Visit
Admission: RE | Admit: 2019-10-07 | Discharge: 2019-10-07 | Disposition: A | Discharge status: Home / Self Care | Payer: Medicare Other | Source: Ambulatory Visit | Attending: Nurse Practitioner | Admitting: Nurse Practitioner

## 2019-10-07 ENCOUNTER — Other Ambulatory Visit: Payer: Self-pay | Admitting: Family Medicine

## 2019-10-07 DIAGNOSIS — Z122 Encounter for screening for malignant neoplasm of respiratory organs: Secondary | ICD-10-CM | POA: Diagnosis not present

## 2019-10-07 DIAGNOSIS — Z87891 Personal history of nicotine dependence: Secondary | ICD-10-CM | POA: Diagnosis present

## 2019-10-07 LAB — COMPLETE METABOLIC PANEL WITH GFR
AG Ratio: 2.1 (calc) (ref 1.0–2.5)
ALT: 21 U/L (ref 9–46)
AST: 15 U/L (ref 10–35)
Albumin: 4.1 g/dL (ref 3.6–5.1)
Alkaline phosphatase (APISO): 73 U/L (ref 35–144)
BUN: 10 mg/dL (ref 7–25)
CO2: 28 mmol/L (ref 20–32)
Calcium: 9.6 mg/dL (ref 8.6–10.3)
Chloride: 106 mmol/L (ref 98–110)
Creat: 0.84 mg/dL (ref 0.70–1.25)
GFR, Est African American: 106 mL/min/{1.73_m2} (ref >=60)
GFR, Est Non African American: 91 mL/min/{1.73_m2} (ref >=60)
Globulin: 2 g/dL (calc) (ref 1.9–3.7)
Glucose, Bld: 69 mg/dL (ref 65–99)
Potassium: 4.3 mmol/L (ref 3.5–5.3)
Sodium: 139 mmol/L (ref 135–146)
Total Bilirubin: 0.4 mg/dL (ref 0.2–1.2)
Total Protein: 6.1 g/dL (ref 6.1–8.1)

## 2019-10-07 LAB — VITAMIN D 25 HYDROXY (VIT D DEFICIENCY, FRACTURES): Vit D, 25-Hydroxy: 15 ng/mL — ABNORMAL LOW (ref 30–100)

## 2019-10-07 LAB — LIPID PANEL
Cholesterol: 194 mg/dL (ref <200)
HDL: 45 mg/dL (ref >=40)
LDL Cholesterol (Calc): 124 mg/dL (calc) — ABNORMAL HIGH
Non-HDL Cholesterol (Calc): 149 mg/dL (calc) — ABNORMAL HIGH (ref <130)
Total CHOL/HDL Ratio: 4.3 (calc) (ref <5.0)
Triglycerides: 136 mg/dL (ref <150)

## 2019-10-07 MED ORDER — VITAMIN D (ERGOCALCIFEROL) 1.25 MG (50000 UNIT) PO CAPS: 50000.0000 [IU] | ORAL_CAPSULE | ORAL | 0 refills | Status: DC

## 2019-10-09 ENCOUNTER — Telehealth: Payer: Self-pay | Admitting: *Deleted

## 2019-10-09 ENCOUNTER — Encounter: Payer: Self-pay | Admitting: Nurse Practitioner

## 2019-10-09 DIAGNOSIS — R591 Generalized enlarged lymph nodes: Secondary | ICD-10-CM

## 2019-10-09 NOTE — Telephone Encounter (Signed)
After review of lung screening results with PCP, and ordering provider, patient is notified of results with recommendation for evaluation of lymphadenopathy with PET scan and further discussion about renal finding after that. Patient is in agreement with this plan.

## 2019-10-09 NOTE — Progress Notes (Signed)
Andres Mullins, 68 y.o. male who was referred to lung cancer screening program/low-dose CT for current use of tobacco products.  He had a low-dose chest CT on 10/07/2019 which noted small bilateral axillary nodes including a dominant 13 mm short axis node on the left, previously measured 8 mm, suspected 17 mm short axis left supraclavicular node incompletely visualized, previously measured 13 mm.  Findings concerning for possible underlying malignancy, question lymphoma.  He will require a PET scan to further evaluate and assist in medical decision making included below possible biopsy and to optimize management.  Patient has been referred to cancer center at [facility].   Additionally, hyperdense lesion in the left upper pole renal collecting system measuring at least 13 mm.  Discussed with his primary care provider who indicates that patient is followed by urology for right renal cyst and enlarged prostate.  Burgess Estelle, Nurse Navigator to reach out to Urology for input.  Patient is asymptomatic.  Advised of possible differential diagnosis of TCC particularly in setting of tobacco use.

## 2019-10-10 NOTE — Telephone Encounter (Signed)
PET scan authorization received. Pt scheduled for PET and has been notified with appt details.   Approved Auth# [id number] valid 10/09/19-11/23/19

## 2019-10-10 NOTE — Addendum Note (Signed)
Addended by: Telford Nab on: 10/10/2019 10:05 AM   Modules accepted: Orders

## 2019-10-16 ENCOUNTER — Other Ambulatory Visit: Payer: Self-pay

## 2019-10-16 ENCOUNTER — Ambulatory Visit
Admission: RE | Admit: 2019-10-16 | Discharge: 2019-10-16 | Disposition: A | Discharge status: Home / Self Care | Payer: Medicare Other | Source: Ambulatory Visit | Attending: Nurse Practitioner | Admitting: Nurse Practitioner

## 2019-10-16 DIAGNOSIS — R591 Generalized enlarged lymph nodes: Secondary | ICD-10-CM

## 2019-10-17 ENCOUNTER — Encounter
Admission: RE | Admit: 2019-10-17 | Discharge: 2019-10-17 | Disposition: A | Discharge status: Home / Self Care | Payer: Medicare Other | Source: Ambulatory Visit | Attending: Nurse Practitioner | Admitting: Nurse Practitioner

## 2019-10-17 DIAGNOSIS — N289 Disorder of kidney and ureter, unspecified: Secondary | ICD-10-CM | POA: Insufficient documentation

## 2019-10-17 DIAGNOSIS — K449 Diaphragmatic hernia without obstruction or gangrene: Secondary | ICD-10-CM | POA: Diagnosis not present

## 2019-10-17 DIAGNOSIS — F17200 Nicotine dependence, unspecified, uncomplicated: Secondary | ICD-10-CM | POA: Diagnosis not present

## 2019-10-17 DIAGNOSIS — R9389 Abnormal findings on diagnostic imaging of other specified body structures: Secondary | ICD-10-CM | POA: Insufficient documentation

## 2019-10-17 DIAGNOSIS — K5730 Diverticulosis of large intestine without perforation or abscess without bleeding: Secondary | ICD-10-CM | POA: Diagnosis not present

## 2019-10-17 DIAGNOSIS — N400 Enlarged prostate without lower urinary tract symptoms: Secondary | ICD-10-CM | POA: Diagnosis not present

## 2019-10-17 DIAGNOSIS — R591 Generalized enlarged lymph nodes: Secondary | ICD-10-CM | POA: Diagnosis not present

## 2019-10-17 LAB — GLUCOSE, CAPILLARY: Glucose-Capillary: 89 mg/dL (ref 70–99)

## 2019-10-17 MED ORDER — FLUDEOXYGLUCOSE F - 18 (FDG) INJECTION
8.8000 | Freq: Once | INTRAVENOUS | Status: AC | PRN
  Administered 2019-10-17: 11:00:00 9.489 via INTRAVENOUS

## 2019-10-20 ENCOUNTER — Telehealth: Payer: Self-pay | Admitting: *Deleted

## 2019-10-20 ENCOUNTER — Other Ambulatory Visit: Payer: Self-pay | Admitting: Nurse Practitioner

## 2019-10-20 DIAGNOSIS — N289 Disorder of kidney and ureter, unspecified: Secondary | ICD-10-CM

## 2019-10-20 DIAGNOSIS — R591 Generalized enlarged lymph nodes: Secondary | ICD-10-CM

## 2019-10-20 NOTE — Progress Notes (Signed)
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 Additionally activity in right hemi thyroid w/o CT correlate. Consider ultrasound and possible biopsy. Patient also had hyperdense area in upper pole of left kidney. Given history of smoking recommend hematuria ct with urology consult for further evaluation and to r/o TCC. Will defer to medical-oncology and PCP who has been following patient for this.

## 2019-10-20 NOTE — Telephone Encounter (Signed)
Contacted patient and reviewed results of PET scan. Given recommendation for medical oncology consultation. Patient is in agreement with this plan.

## 2019-10-22 ENCOUNTER — Telehealth: Payer: Self-pay | Admitting: Internal Medicine

## 2019-10-22 DIAGNOSIS — R591 Generalized enlarged lymph nodes: Secondary | ICD-10-CM

## 2019-10-22 NOTE — Telephone Encounter (Signed)
Melissa- just checking on the status of follow up with me.   C- Please schedule appt with next week- cbc/cmp/ldh- 15 mins/ follow up.

## 2019-10-27 NOTE — Addendum Note (Signed)
Addended by: Sabino Gasser on: 10/27/2019 09:33 AM   Modules accepted: Orders

## 2019-10-28 ENCOUNTER — Other Ambulatory Visit: Payer: Self-pay

## 2019-10-28 NOTE — Progress Notes (Signed)
Patient pre screened for office appointment, no questions or concerns today. Patient reminded of upcoming appointment time and date. 

## 2019-10-29 ENCOUNTER — Inpatient Hospital Stay (HOSPITAL_BASED_OUTPATIENT_CLINIC_OR_DEPARTMENT_OTHER): Payer: Medicare Other | Admitting: Internal Medicine

## 2019-10-29 ENCOUNTER — Other Ambulatory Visit: Payer: Self-pay

## 2019-10-29 ENCOUNTER — Inpatient Hospital Stay: Payer: Medicare Other | Attending: Internal Medicine

## 2019-10-29 DIAGNOSIS — D72820 Lymphocytosis (symptomatic): Secondary | ICD-10-CM

## 2019-10-29 DIAGNOSIS — Z818 Family history of other mental and behavioral disorders: Secondary | ICD-10-CM | POA: Diagnosis not present

## 2019-10-29 DIAGNOSIS — Z8261 Family history of arthritis: Secondary | ICD-10-CM | POA: Insufficient documentation

## 2019-10-29 DIAGNOSIS — Z8249 Family history of ischemic heart disease and other diseases of the circulatory system: Secondary | ICD-10-CM | POA: Diagnosis not present

## 2019-10-29 DIAGNOSIS — R590 Localized enlarged lymph nodes: Secondary | ICD-10-CM | POA: Insufficient documentation

## 2019-10-29 DIAGNOSIS — C9110 Chronic lymphocytic leukemia of B-cell type not having achieved remission: Secondary | ICD-10-CM | POA: Insufficient documentation

## 2019-10-29 DIAGNOSIS — Z79899 Other long term (current) drug therapy: Secondary | ICD-10-CM | POA: Diagnosis not present

## 2019-10-29 DIAGNOSIS — E785 Hyperlipidemia, unspecified: Secondary | ICD-10-CM | POA: Diagnosis not present

## 2019-10-29 DIAGNOSIS — F17210 Nicotine dependence, cigarettes, uncomplicated: Secondary | ICD-10-CM | POA: Insufficient documentation

## 2019-10-29 DIAGNOSIS — R591 Generalized enlarged lymph nodes: Secondary | ICD-10-CM

## 2019-10-29 LAB — CBC WITH DIFFERENTIAL/PLATELET
Abs Immature Granulocytes: 0.06 10*3/uL (ref 0.00–0.07)
Basophils Absolute: 0.1 10*3/uL (ref 0.0–0.1)
Basophils Relative: 1 %
Eosinophils Absolute: 0.2 10*3/uL (ref 0.0–0.5)
Eosinophils Relative: 1 %
HCT: 47 % (ref 39.0–52.0)
Hemoglobin: 15.9 g/dL (ref 13.0–17.0)
Immature Granulocytes: 0 %
Lymphocytes Relative: 66 %
Lymphs Abs: 10.3 10*3/uL — ABNORMAL HIGH (ref 0.7–4.0)
MCH: 30.3 pg (ref 26.0–34.0)
MCHC: 33.8 g/dL (ref 30.0–36.0)
MCV: 89.5 fL (ref 80.0–100.0)
Monocytes Absolute: 0.8 10*3/uL (ref 0.1–1.0)
Monocytes Relative: 5 %
Neutro Abs: 4.1 10*3/uL (ref 1.7–7.7)
Neutrophils Relative %: 27 %
Platelets: 244 10*3/uL (ref 150–400)
RBC Morphology: NORMAL
RBC: 5.25 MIL/uL (ref 4.22–5.81)
RDW: 13.4 % (ref 11.5–15.5)
Smear Review: ADEQUATE
WBC Morphology: ABNORMAL
WBC: 15.5 10*3/uL — ABNORMAL HIGH (ref 4.0–10.5)
nRBC: 0.3 % — ABNORMAL HIGH (ref 0.0–0.2)

## 2019-10-29 LAB — COMPREHENSIVE METABOLIC PANEL
ALT: 27 U/L (ref 0–44)
AST: 19 U/L (ref 15–41)
Albumin: 4 g/dL (ref 3.5–5.0)
Alkaline Phosphatase: 74 U/L (ref 38–126)
Anion gap: 4 — ABNORMAL LOW (ref 5–15)
BUN: 11 mg/dL (ref 8–23)
CO2: 24 mmol/L (ref 22–32)
Calcium: 8.8 mg/dL — ABNORMAL LOW (ref 8.9–10.3)
Chloride: 106 mmol/L (ref 98–111)
Creatinine, Ser: 0.83 mg/dL (ref 0.61–1.24)
GFR calc Af Amer: >60 mL/min (ref >60)
GFR calc non Af Amer: >60 mL/min (ref >60)
Glucose, Bld: 132 mg/dL — ABNORMAL HIGH (ref 70–99)
Potassium: 4.1 mmol/L (ref 3.5–5.1)
Sodium: 134 mmol/L — ABNORMAL LOW (ref 135–145)
Total Bilirubin: 0.6 mg/dL (ref 0.3–1.2)
Total Protein: 6.9 g/dL (ref 6.5–8.1)

## 2019-10-29 LAB — LACTATE DEHYDROGENASE: LDH: 131 U/L (ref 98–192)

## 2019-10-29 NOTE — Assessment & Plan Note (Addendum)
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  Hemoglobin is normal platelets normal/white count 15 lymphocyte count 10.  Clinically stable.  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  Would recommend checking Ig VH/FISH panel at next visit.  #Lung cancer screening program-continue as planned.  Again reminded about quitting smoking.   # DISPOSITION:print a copy of PET #  6 months- MD; labs- cbc/cmp/ldh-Dr.B  # I reviewed the blood work- with the patient in detail; also reviewed the imaging independently [as summarized above]; and with the patient in detail.

## 2019-10-29 NOTE — Assessment & Plan Note (Signed)
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 Patient is asymptomatic. There is a possibility that monoclonal B-cell lymphocytosis could transform into CLL over time. Again reviewed the symptoms of CLL.   # Again reminded to quit smoking- discussed re: lung cancer screening. Interested; will inform [employer].   # RE: follow up pt interested with PCP for now; and he will follow up with me if any increase in his WBC/ALC. For now follow up with me as needed.

## 2019-10-29 NOTE — Progress Notes (Signed)
[facility] OFFICE PROGRESS NOTE  Patient Care Team: [name], PA-C as PCP - General (Family Medicine) Sanda Klein, Satira Anis, MD as Attending Physician (Family Medicine) Brendolyn Patty, MD (Dermatology) Billey Co, MD as Consulting Physician (Urology)   SUMMARY OF ONCOLOGIC HISTORY:  Oncology History Overview Note  # 2016- CLL-[flow] CD5 (+)/CD23 (+) clonal B-cell population, CLL/SLL phenotype, 18% of leukocytes, <5,000/uL, CD38 positive   # 2020 PET [incidental]- Generalized Lymphadenopathy- left supraclav/ bil ax LN L> R; mesenteric LN; pelvic LN  # Lung cancer screening- on [facility]     CLL (chronic lymphocytic leukemia) ([place])  10/29/2019 Initial Diagnosis   CLL (chronic lymphocytic leukemia) ([place])      INTERVAL HISTORY:  A pleasant 68year-old male patient with prior history lymphocytosis-monoclonal-has been referred to [place] given the recent abnormal PET scan.  Patient had a flow cytometry in 2016 that showed monoclonal lymphocytes suggestive of CLL-however absolute lymphocyte count was around 600.   Given history of smoking patient-admit undergoing lung cancer screening CT scans.  Most recent CT scan showed increasing lymphadenopathy in the neck and also underarms.  This led to a PET scan.  Patient continues to feel good otherwise; denies any new symptoms.  Review of Systems  Constitutional: Negative for chills, diaphoresis, fever, malaise/fatigue and weight loss.  HENT: Negative for nosebleeds and sore throat.   Eyes: Negative for double vision.  Respiratory: Negative for cough, hemoptysis, sputum production, shortness of breath and wheezing.   Cardiovascular: Negative for chest pain, palpitations, orthopnea and leg swelling.  Gastrointestinal: Negative for abdominal pain, blood in stool, constipation, diarrhea, heartburn, melena, nausea and vomiting.  Genitourinary: Negative for dysuria, frequency and urgency.  Musculoskeletal: Negative for back pain and  joint pain.  Skin: Negative.  Negative for itching and rash.  Neurological: Negative for dizziness, tingling, focal weakness, weakness and headaches.  Endo/Heme/Allergies: Does not bruise/bleed easily.  Psychiatric/Behavioral: Negative for depression. The patient is not nervous/anxious and does not have insomnia.      PAST MEDICAL HISTORY :  Past Medical History:  Diagnosis Date  . Atherosclerosis of coronary artery 09/28/2016   Chest CT Nov 2017  . Elevated C-reactive protein (CRP)   . Elevated PSA   . Fractured sternum 1999  . Herpes   . History of rib fracture 1999   multiple  . Hyperlipidemia   . Psoriasis   . Rheumatoid factor positive April 2015  . Rosacea   . Thoracic aortic atherosclerosis ([place]) 09/28/2016   Chest CT Nov 2017  . Tobacco abuse   . Vitamin D deficiency disease     PAST SURGICAL HISTORY :   Past Surgical History:  Procedure Laterality Date  . APPENDECTOMY  1974  . SHOULDER ARTHROSCOPY  10/23/2016   Procedure: ARTHROSCOPY SHOULDER WITH DISTAL CLAVICLE EXCISION, PARTIAL ACROMIONECTOMY, AND DEBRIDEMENT;  Surgeon: Earnestine Leys, MD;  Location: [facility] ORS;  Service: Orthopedics;;  . TIBIA FRACTURE SURGERY Left 2004   titanium rod, ORIF    FAMILY HISTORY :   Family History  Problem Relation Age of Onset  . Heart disease Father   . Osteoarthritis Father   . Alzheimer's disease Father   . Cancer Neg Hx   . Diabetes Neg Hx   . Hypertension Neg Hx   . Stroke Neg Hx   . COPD Neg Hx     SOCIAL HISTORY:   Social History   Tobacco Use  . Smoking status: Current Every Day Smoker    Packs/day: 0.25    Years:  44.00    Pack years: 11.00    Types: Cigarettes  . Smokeless tobacco: Never Used  Substance Use Topics  . Alcohol use: Yes    Alcohol/week: 3.0 standard drinks    Types: 3 Glasses of wine per week    Comment: a couple glasses of wine a week  . Drug use: No    ALLERGIES:  has No Known Allergies.  MEDICATIONS:  Current Outpatient  Medications  Medication Sig Dispense Refill  . doxycycline (VIBRA-TABS) 100 MG tablet Take 1 tablet (100 mg total) by mouth 2 (two) times daily. (Patient taking differently: Take 100 mg by mouth as needed. ) 10 tablet 5  . metroNIDAZOLE (METROGEL) 1 % gel Apply topically daily. (Patient taking differently: Apply topically as needed. ) 45 g 1  . valACYclovir (VALTREX) 500 MG tablet Take 1 tablet (500 mg total) by mouth 2 (two) times daily as needed. 30 tablet 3  . Vitamin D, Ergocalciferol, (DRISDOL) 1.25 MG (50000 UT) CAPS capsule Take 1 capsule (50,000 Units total) by mouth every 7 (seven) days. x12 weeks. 12 capsule 0   No current facility-administered medications for this visit.     PHYSICAL EXAMINATION: ECOG PERFORMANCE STATUS: 0 - Asymptomatic  BP 134/83 (BP Location: Left Arm, Patient Position: Sitting, Cuff Size: Normal)   Pulse 84   Temp (!) 97.5 F (36.4 C) (Tympanic)   Wt 170 lb 8 oz (77.3 kg)   BMI 28.37 kg/m   Filed Weights   10/29/19 1101  Weight: 170 lb 8 oz (77.3 kg)    Physical Exam  Constitutional: He is oriented to person, place, and time and well-developed, well-nourished, and in no distress.  Alone.  Approximately 1 to 2 cm lymph node felt in the left supraclavicular region.  Approximate 2 cm lymph node felt in the left underarm; 1 cm lymph node in the right underarm  HENT:  Head: Normocephalic and atraumatic.  Mouth/Throat: Oropharynx is clear and moist. No oropharyngeal exudate.  Eyes: Pupils are equal, round, and reactive to light.  Neck: Normal range of motion. Neck supple.  Cardiovascular: Normal rate and regular rhythm.  Pulmonary/Chest: No respiratory distress. He has no wheezes.  Abdominal: Soft. Bowel sounds are normal. He exhibits no distension and no mass. There is no abdominal tenderness. There is no rebound and no guarding.  Musculoskeletal: Normal range of motion.        General: No tenderness or edema.  Neurological: He is alert and oriented  to person, place, and time.  Skin: Skin is warm.  Psychiatric: Affect normal.    LABORATORY DATA:  I have reviewed the data as listed    Component Value Date/Time   NA 134 (L) 10/29/2019 1034   K 4.1 10/29/2019 1034   CL 106 10/29/2019 1034   CO2 24 10/29/2019 1034   GLUCOSE 132 (H) 10/29/2019 1034   BUN 11 10/29/2019 1034   CREATININE 0.83 10/29/2019 1034   CREATININE 0.84 10/06/2019 0000   CALCIUM 8.8 (L) 10/29/2019 1034   PROT 6.9 10/29/2019 1034   ALBUMIN 4.0 10/29/2019 1034   AST 19 10/29/2019 1034   ALT 27 10/29/2019 1034   ALKPHOS 74 10/29/2019 1034   BILITOT 0.6 10/29/2019 1034   GFRNONAA >60 10/29/2019 1034   GFRNONAA 91 10/06/2019 0000   GFRAA >60 10/29/2019 1034   GFRAA 106 10/06/2019 0000    No results found for: SPEP, UPEP  Lab Results  Component Value Date   WBC 15.5 (H) 10/29/2019   NEUTROABS  4.1 10/29/2019   HGB 15.9 10/29/2019   HCT 47.0 10/29/2019   MCV 89.5 10/29/2019   PLT 244 10/29/2019      Chemistry      Component Value Date/Time   NA 134 (L) 10/29/2019 1034   K 4.1 10/29/2019 1034   CL 106 10/29/2019 1034   CO2 24 10/29/2019 1034   BUN 11 10/29/2019 1034   CREATININE 0.83 10/29/2019 1034   CREATININE 0.84 10/06/2019 0000      Component Value Date/Time   CALCIUM 8.8 (L) 10/29/2019 1034   ALKPHOS 74 10/29/2019 1034   AST 19 10/29/2019 1034   ALT 27 10/29/2019 1034   BILITOT 0.6 10/29/2019 1034       RADIOGRAPHIC STUDIES: I have personally reviewed the radiological images as listed and agreed with the findings in the report. No results found.   ASSESSMENT & PLAN:   .Monoclonal B-cell lymphocytosis # MONOCLONAL LYMPHOCYTOSIS vs or early CLL. Patient is asymptomatic. There is a possibility that monoclonal B-cell lymphocytosis could transform into CLL over time. Again reviewed the symptoms of CLL.   # Again reminded to quit smoking- discussed re: lung cancer screening. Interested; will inform [employer].   # RE: follow up  pt interested with PCP for now; and he will follow up with me if any increase in his WBC/ALC. For now follow up with me as needed.   CLL (chronic lymphocytic leukemia) ([facility]) #SLL/CLL. Patient is asymptomatic- NOV 2020- PET- generalized Lymphadenopathy-1.7 cm left supraclav/~1 to 2 cm bil ax LN L> R; abdominal/pelvic adenopathy-low SUV-consistent with low-grade lymphoproliferative disorder.  Hemoglobin is normal platelets normal/white count 15 lymphocyte count 10.  Clinically stable.  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  Would recommend checking Ig VH/FISH panel at next visit.  #Lung cancer screening program-continue as planned.  Again reminded about quitting smoking.   # DISPOSITION:print a copy of PET #  6 months- MD; labs- cbc/cmp/ldh-Dr.B    Orders Placed This Encounter  Procedures  . IGVH mutation testing    Standing Status:   Future    Standing Expiration Date:   12/02/2020    Order Specific Question:   Test name / description:    Answer:   IGVH mutation testing- #[id number] [lavender tube]  . FISH, CLL Prognostic Panel    Standing Status:   Future    Standing Expiration Date:   10/28/2020        Cammie Sickle, MD 10/29/2019 12:43 PM

## 2019-10-31 ENCOUNTER — Telehealth: Payer: Self-pay | Admitting: Internal Medicine

## 2019-10-31 DIAGNOSIS — E559 Vitamin D deficiency, unspecified: Secondary | ICD-10-CM

## 2019-10-31 NOTE — Addendum Note (Signed)
Addended by: [name] on: 10/31/2019 01:20 PM   Modules accepted: Orders

## 2019-10-31 NOTE — Telephone Encounter (Signed)
Late entry spoke to patient regarding slightly elevated lymphocytosis/recommend follow-up as planned at next visit.  Patient very thankful for the call.

## 2019-11-18 ENCOUNTER — Ambulatory Visit: Payer: Medicare Other

## 2019-11-18 ENCOUNTER — Ambulatory Visit: Payer: Medicare Other | Admitting: Family Medicine

## 2019-11-25 ENCOUNTER — Other Ambulatory Visit: Payer: Self-pay | Admitting: *Deleted

## 2019-11-25 ENCOUNTER — Ambulatory Visit
Admission: RE | Admit: 2019-11-25 | Discharge: 2019-11-25 | Disposition: A | Discharge status: Home / Self Care | Payer: Medicare Other | Source: Ambulatory Visit | Attending: Nurse Practitioner | Admitting: Nurse Practitioner

## 2019-11-25 ENCOUNTER — Other Ambulatory Visit: Payer: Self-pay

## 2019-11-25 DIAGNOSIS — N2889 Other specified disorders of kidney and ureter: Secondary | ICD-10-CM | POA: Diagnosis not present

## 2019-11-25 MED ORDER — IOHEXOL 300 MG/ML  SOLN
100.0000 mL | Freq: Once | INTRAMUSCULAR | Status: AC | PRN
  Administered 2019-11-25: 15:00:00 100 mL via INTRAVENOUS

## 2019-11-26 ENCOUNTER — Telehealth: Payer: Self-pay | Admitting: *Deleted

## 2019-11-26 NOTE — Telephone Encounter (Signed)
After reviewing results with urology, patient is notified of the results with recommendation from urologist for no further workup of renal lesion and no suspicion of malignancy. Patient verbalizes understanding.   IMPRESSION: 1. There is a hyperdense lesion within the upper pole of the left kidney which is favored to represent a hemorrhagic or proteinaceous cyst. 2. Similar appearance of mild retroperitoneal adenopathy compatible with clinical history of CLL. 3. Aortic atherosclerosis. 4. Nonobstructing left renal calculus.  Aortic Atherosclerosis (ICD10-I70.0).

## 2019-12-11 ENCOUNTER — Ambulatory Visit (INDEPENDENT_AMBULATORY_CARE_PROVIDER_SITE_OTHER): Payer: Medicare PPO

## 2019-12-11 ENCOUNTER — Other Ambulatory Visit: Payer: Self-pay

## 2019-12-11 ENCOUNTER — Telehealth: Payer: Self-pay

## 2019-12-11 VITALS — BP 104/64 | HR 73 | Temp 96.9°F | Resp 16 | Ht 65.0 in | Wt 169.2 lb

## 2019-12-11 DIAGNOSIS — Z0000 Encounter for general adult medical examination without abnormal findings: Secondary | ICD-10-CM

## 2019-12-11 DIAGNOSIS — Z23 Encounter for immunization: Secondary | ICD-10-CM

## 2019-12-11 NOTE — Patient Instructions (Signed)
Andres Mullins , Thank you for taking time to come for your Medicare Wellness Visit. I appreciate your ongoing commitment to your health goals. Please review the following plan we discussed and let me know if I can assist you in the future.   Screening recommendations/referrals: Colonoscopy: Cologuard completed 05/28/17 Recommended yearly ophthalmology/optometry visit for glaucoma screening and checkup Recommended yearly dental visit for hygiene and checkup  Vaccinations: Influenza vaccine: done 09/20/19 Pneumococcal vaccine: done today Tdap vaccine: due Shingles vaccine: Shingrix discussed. Please contact your pharmacy for coverage information.   Advanced directives: Please bring a copy of your health care power of attorney and living will to the office at your convenience.  Conditions/risks identified: If you wish to quit smoking, help is available. For free tobacco cessation program offerings call the [facility] at 6783082077 or Live Well Line at 331-682-2138. You may also visit [email or web address] or email [email or web address] for more information on other programs.   Next appointment: Please follow up in one year for your Medicare Annual Wellness visit.    Preventive Care 65 Years and Older, Male Preventive care refers to lifestyle choices and visits with your health care provider that can promote health and wellness. What does preventive care include?  A yearly physical exam. This is also called an annual well check.  Dental exams once or twice a year.  Routine eye exams. Ask your health care provider how often you should have your eyes checked.  Personal lifestyle choices, including:  Daily care of your teeth and gums.  Regular physical activity.  Eating a healthy diet.  Avoiding tobacco and drug use.  Limiting alcohol use.  Practicing safe sex.  Taking low doses of aspirin every day.  Taking vitamin and mineral supplements as recommended by  your health care provider. What happens during an annual well check? The services and screenings done by your health care provider during your annual well check will depend on your age, overall health, lifestyle risk factors, and family history of disease. Counseling  Your health care provider may ask you questions about your:  Alcohol use.  Tobacco use.  Drug use.  Emotional well-being.  Home and relationship well-being.  Sexual activity.  Eating habits.  History of falls.  Memory and ability to understand (cognition).  Work and work Statistician. Screening  You may have the following tests or measurements:  Height, weight, and BMI.  Blood pressure.  Lipid and cholesterol levels. These may be checked every 5 years, or more frequently if you are over 80 years old.  Skin check.  Lung cancer screening. You may have this screening every year starting at age 50 if you have a 30-pack-year history of smoking and currently smoke or have quit within the past 15 years.  Fecal occult blood test (FOBT) of the stool. You may have this test every year starting at age 72.  Flexible sigmoidoscopy or colonoscopy. You may have a sigmoidoscopy every 5 years or a colonoscopy every 10 years starting at age 52.  Prostate cancer screening. Recommendations will vary depending on your family history and other risks.  Hepatitis C blood test.  Hepatitis B blood test.  Sexually transmitted disease (STD) testing.  Diabetes screening. This is done by checking your blood sugar (glucose) after you have not eaten for a while (fasting). You may have this done every 1-3 years.  Abdominal aortic aneurysm (AAA) screening. You may need this if you are a current or former smoker.  Osteoporosis. You may  be screened starting at age 26 if you are at high risk. Talk with your health care provider about your test results, treatment options, and if necessary, the need for more tests. Vaccines  Your  health care provider may recommend certain vaccines, such as:  Influenza vaccine. This is recommended every year.  Tetanus, diphtheria, and acellular pertussis (Tdap, Td) vaccine. You may need a Td booster every 10 years.  Zoster vaccine. You may need this after age 30.  Pneumococcal 13-valent conjugate (PCV13) vaccine. One dose is recommended after age 38.  Pneumococcal polysaccharide (PPSV23) vaccine. One dose is recommended after age 31. Talk to your health care provider about which screenings and vaccines you need and how often you need them. This information is not intended to replace advice given to you by your health care provider. Make sure you discuss any questions you have with your health care provider. Document Released: 12/10/2015 Document Revised: 08/02/2016 Document Reviewed: 09/14/2015 Elsevier Interactive Patient Education  2017 [employer] Prevention in the Home Falls can cause injuries. They can happen to people of all ages. There are many things you can do to make your home safe and to help prevent falls. What can I do on the outside of my home?  Regularly fix the edges of walkways and driveways and fix any cracks.  Remove anything that might make you trip as you walk through a door, such as a raised step or threshold.  Trim any bushes or trees on the path to your home.  Use bright outdoor lighting.  Clear any walking paths of anything that might make someone trip, such as rocks or tools.  Regularly check to see if handrails are loose or broken. Make sure that both sides of any steps have handrails.  Any raised decks and porches should have guardrails on the edges.  Have any leaves, snow, or ice cleared regularly.  Use sand or salt on walking paths during winter.  Clean up any spills in your garage right away. This includes oil or grease spills. What can I do in the bathroom?  Use night lights.  Install grab bars by the toilet and in the tub and  shower. Do not use towel bars as grab bars.  Use non-skid mats or decals in the tub or shower.  If you need to sit down in the shower, use a plastic, non-slip stool.  Keep the floor dry. Clean up any water that spills on the floor as soon as it happens.  Remove soap buildup in the tub or shower regularly.  Attach bath mats securely with double-sided non-slip rug tape.  Do not have throw rugs and other things on the floor that can make you trip. What can I do in the bedroom?  Use night lights.  Make sure that you have a light by your bed that is easy to reach.  Do not use any sheets or blankets that are too big for your bed. They should not hang down onto the floor.  Have a firm chair that has side arms. You can use this for support while you get dressed.  Do not have throw rugs and other things on the floor that can make you trip. What can I do in the kitchen?  Clean up any spills right away.  Avoid walking on wet floors.  Keep items that you use a lot in easy-to-reach places.  If you need to reach something above you, use a strong step stool that has  a grab bar.  Keep electrical cords out of the way.  Do not use floor polish or wax that makes floors slippery. If you must use wax, use non-skid floor wax.  Do not have throw rugs and other things on the floor that can make you trip. What can I do with my stairs?  Do not leave any items on the stairs.  Make sure that there are handrails on both sides of the stairs and use them. Fix handrails that are broken or loose. Make sure that handrails are as long as the stairways.  Check any carpeting to make sure that it is firmly attached to the stairs. Fix any carpet that is loose or worn.  Avoid having throw rugs at the top or bottom of the stairs. If you do have throw rugs, attach them to the floor with carpet tape.  Make sure that you have a light switch at the top of the stairs and the bottom of the stairs. If you do not  have them, ask someone to add them for you. What else can I do to help prevent falls?  Wear shoes that:  Do not have high heels.  Have rubber bottoms.  Are comfortable and fit you well.  Are closed at the toe. Do not wear sandals.  If you use a stepladder:  Make sure that it is fully opened. Do not climb a closed stepladder.  Make sure that both sides of the stepladder are locked into place.  Ask someone to hold it for you, if possible.  Clearly mark and make sure that you can see:  Any grab bars or handrails.  First and last steps.  Where the edge of each step is.  Use tools that help you move around (mobility aids) if they are needed. These include:  Canes.  Walkers.  Scooters.  Crutches.  Turn on the lights when you go into a dark area. Replace any light bulbs as soon as they burn out.  Set up your furniture so you have a clear path. Avoid moving your furniture around.  If any of your floors are uneven, fix them.  If there are any pets around you, be aware of where they are.  Review your medicines with your doctor. Some medicines can make you feel dizzy. This can increase your chance of falling. Ask your doctor what other things that you can do to help prevent falls. This information is not intended to replace advice given to you by your health care provider. Make sure you discuss any questions you have with your health care provider. Document Released: 09/09/2009 Document Revised: 04/20/2016 Document Reviewed: 12/18/2014 Elsevier Interactive Patient Education  2017 [employer].

## 2019-12-11 NOTE — Telephone Encounter (Signed)
Pt seen in office today for AWV. He wanted to know if he needed to request a refill for his Rx vitamin D or start OTC, if so how much or if he needs to repeat labs. Please advise. Thank you!

## 2019-12-11 NOTE — Progress Notes (Signed)
Subjective:   Andres Mullins is a 69 y.o. male who presents for an Initial Medicare Annual Wellness Visit.  Review of Systems   Cardiac Risk Factors include: advanced age (>22mn, >>85women);dyslipidemia;male gender    Objective:    Today's Vitals   12/11/19 0845  BP: 104/64  Pulse: 73  Resp: 16  Temp: (!) 96.9 F (36.1 C)  TempSrc: Temporal  SpO2: 98%  Weight: 169 lb 3.2 oz (76.7 kg)  Height: 5' 5"  (1.651 m)   Body mass index is 28.16 kg/m.  Advanced Directives 12/11/2019 10/28/2019 05/18/2017 04/27/2017 10/23/2016 10/17/2016 10/16/2016  Does Patient Have a Medical Advance Directive? Yes Yes Yes No Yes Yes Yes  Type of AParamed[place]of [place] - - [place] Living will;Healthcare Power of Attorney Living will;Healthcare Power of Attorney  Does patient want to make changes to medical advance directive? - No - Patient declined - - - - No - Patient declined  Copy of [place] Chart? No - copy requested No - copy requested - - No - copy requested - No - copy requested    Current Medications (verified) Outpatient Encounter Medications as of 12/11/2019  Medication Sig  . Vitamin D, Ergocalciferol, (DRISDOL) 1.25 MG (50000 UT) CAPS capsule Take 1 capsule (50,000 Units total) by mouth every 7 (seven) days. x12 weeks.  .Marland Kitchendoxycycline (VIBRA-TABS) 100 MG tablet Take 1 tablet (100 mg total) by mouth 2 (two) times daily.  . metroNIDAZOLE (METROGEL) 1 % gel Apply topically daily. (Patient taking differently: Apply topically as needed. )  . valACYclovir (VALTREX) 500 MG tablet Take 1 tablet (500 mg total) by mouth 2 (two) times daily as needed.   No facility-administered encounter medications on file as of 12/11/2019.    Allergies (verified) Gluten meal   History: Past Medical History:  Diagnosis Date  . Atherosclerosis of coronary artery 09/28/2016   Chest  CT Nov 2017  . Elevated C-reactive protein (CRP)   . Elevated PSA   . Fractured sternum 1999  . Herpes   . History of rib fracture 1999   multiple  . Hyperlipidemia   . Psoriasis   . Rheumatoid factor positive April 2015  . Rosacea   . Thoracic aortic atherosclerosis ([place] 09/28/2016   Chest CT Nov 2017  . Tobacco abuse   . Vitamin D deficiency disease    Past Surgical History:  Procedure Laterality Date  . APPENDECTOMY  1974  . SHOULDER ARTHROSCOPY  10/23/2016   Procedure: ARTHROSCOPY SHOULDER WITH DISTAL CLAVICLE EXCISION, PARTIAL ACROMIONECTOMY, AND DEBRIDEMENT;  Surgeon: HEarnestine Leys MD;  Location: [facility] ORS;  Service: Orthopedics;;  . TIBIA FRACTURE SURGERY Left 2004   titanium rod, ORIF   Family History  Problem Relation Age of Onset  . Heart disease Father   . Osteoarthritis Father   . Alzheimer's disease Father   . Cancer Neg Hx   . Diabetes Neg Hx   . Hypertension Neg Hx   . Stroke Neg Hx   . COPD Neg Hx    Social History   Socioeconomic History  . Marital status: Married    Spouse name: colleen  . Number of children: Not on file  . Years of education: 174 . Highest education level: Bachelor's degree (e.g., BA, AB, BS)  Occupational History  . Occupation: retired  Tobacco Use  . Smoking status: Current Every Day Smoker    Packs/day: 0.50  Years: 44.00    Pack years: 22.00    Types: Cigarettes  . Smokeless tobacco: Never Used  Substance and Sexual Activity  . Alcohol use: Yes    Alcohol/week: 3.0 standard drinks    Types: 3 Glasses of wine per week    Comment: a couple glasses of wine a week  . Drug use: No  . Sexual activity: Yes  Other Topics Concern  . Not on file  Social History Narrative   Consumes ~4 cups of coffee/day   Social Determinants of Health   Financial Resource Strain: Low Risk   . Difficulty of Paying Living Expenses: Not hard at all  Food Insecurity: No Food Insecurity  . Worried About Charity fundraiser in the Last  Year: Never true  . Ran Out of Food in the Last Year: Never true  Transportation Needs: No Transportation Needs  . Lack of Transportation (Medical): No  . Lack of Transportation (Non-Medical): No  Physical Activity: Insufficiently Active  . Days of Exercise per Week: 3 days  . Minutes of Exercise per Session: 30 min  Stress: No Stress Concern Present  . Feeling of Stress : Not at all  Social Connections: Somewhat Isolated  . Frequency of Communication with Friends and Family: Once a week  . Frequency of Social Gatherings with Friends and Family: Once a week  . Attends Religious Services: Never  . Active Member of Clubs or Organizations: Yes  . Attends Archivist Meetings: More than 4 times per year  . Marital Status: Married   Tobacco Counseling Ready to quit: No Counseling given: No   Clinical Intake:  Pre-visit preparation completed: Yes        BMI - recorded: 28.16 Nutritional Status: BMI 25 -29 Overweight Nutritional Risks: None Diabetes: No  How often do you need to have someone help you when you read instructions, pamphlets, or other written materials from your doctor or pharmacy?: 1 - Never  Interpreter Needed?: No  Information entered by :: Clemetine Marker LPN  Activities of Daily Living In your present state of health, do you have any difficulty performing the following activities: 12/11/2019 10/06/2019  Hearing? N N  Comment declines hearing aids -  Vision? N N  Difficulty concentrating or making decisions? N N  Walking or climbing stairs? N N  Dressing or bathing? N N  Doing errands, shopping? N N  Preparing Food and eating ? N -  Using the Toilet? N -  In the past six months, have you accidently leaked urine? N -  Do you have problems with loss of bowel control? N -  Managing your Medications? N -  Managing your Finances? N -  Housekeeping or managing your Housekeeping? N -  Some recent data might be hidden     Immunizations and Health  Maintenance Immunization History  Administered Date(s) Administered  . Influenza,inj,Quad PF,6+ Mos 09/20/2019  . Influenza-Unspecified 09/17/2019  . Pneumococcal Conjugate-13 12/11/2019  . Td 11/27/2005  . Zoster 10/14/2015   Health Maintenance Due  Topic Date Due  . Hepatitis C Screening  Oct 07, 1951  . TETANUS/TDAP  11/28/2015    Patient Care Team: [name], PA-C as PCP - General (Family Medicine) Sanda Klein Satira Anis, MD as Attending Physician (Family Medicine) Brendolyn Patty, MD (Dermatology) Billey Co, MD as Consulting Physician (Urology)  Indicate any recent Medical Services you may have received from other than [facility] providers in the past year (date may be approximate).    Assessment:  This is a routine wellness examination for [place].  Hearing/Vision screen  Hearing Screening   125Hz  250Hz  500Hz  1000Hz  2000Hz  3000Hz  4000Hz  6000Hz  8000Hz   Right ear:           Left ear:           Comments: Pt denies hearing difficulty   Vision Screening Comments: Vision screenings at [facility] every 2 years   Dietary issues and exercise activities discussed: Current Exercise Habits: Home exercise routine, Type of exercise: Other - see comments(hiking, kayaking), Time (Minutes): 30, Frequency (Times/Week): 3, Weekly Exercise (Minutes/Week): 90, Intensity: Moderate, Exercise limited by: None identified  Goals   None    Depression Screen PHQ 2/9 Scores 12/11/2019 10/06/2019 11/15/2018 10/14/2018  PHQ - 2 Score 0 0 0 0  PHQ- 9 Score - 0 0 0    Fall Risk Fall Risk  12/11/2019 10/06/2019 11/15/2018 10/14/2018 05/18/2017  Falls in the past year? 0 0 0 0 No  Number falls in past yr: 0 0 0 - -  Injury with Fall? 0 0 0 - -  Risk for fall due to : No Fall Risks - - - -  Follow up Falls prevention discussed - - - -    FALL RISK PREVENTION PERTAINING TO THE HOME:  Any stairs in or around the home? Yes  If so, do they handrails? Yes   Home free of loose throw rugs in  walkways, pet beds, electrical cords, etc? Yes  Adequate lighting in your home to reduce risk of falls? Yes   ASSISTIVE DEVICES UTILIZED TO PREVENT FALLS:  Life alert? No  Use of a cane, walker or w/c? No  Grab bars in the bathroom? Yes  Shower chair or bench in shower? Yes  Elevated toilet seat or a handicapped toilet? No   DME ORDERS:  DME order needed?  No   TIMED UP AND GO:  Was the test performed? Yes .  Length of time to ambulate 10 feet: 5 sec.   GAIT:  Appearance of gait: Gait stead-fast and without the use of an assistive device.   Education: Fall risk prevention has been discussed.  Intervention(s) required? No   Cognitive Function:     6CIT Screen 11/15/2018  What Year? 0 points  What month? 0 points  What time? 0 points  Count back from 20 0 points  Months in reverse 0 points  Repeat phrase 0 points  Total Score 0    Screening Tests Health Maintenance  Topic Date Due  . Hepatitis C Screening  1951/07/04  . TETANUS/TDAP  11/28/2015  . Fecal DNA (Cologuard)  05/28/2020  . PNA vac Low Risk Adult (2 of 2 - PPSV23) 12/10/2020  . INFLUENZA VACCINE  Completed    Qualifies for Shingles Vaccine?  Yes  Zostavax completed 2016. Due for Shingrix. Education has been provided regarding the importance of this vaccine. Pt has been advised to call insurance company to determine out of pocket expense. Advised may also receive vaccine at local pharmacy or Health Dept. Verbalized acceptance and understanding.  Tdap: Although this vaccine is not a covered service during a Wellness Exam, does the patient still wish to receive this vaccine today?  No .  Education has been provided regarding the importance of this vaccine. Advised may receive this vaccine at local pharmacy or Health Dept. Aware to provide a copy of the vaccination record if obtained from local pharmacy or Health Dept. Verbalized acceptance and understanding.  Flu Vaccine: Up to  date  Pneumococcal Vaccine:  Due for Pneumococcal vaccine. Does the patient want to receive this vaccine today?  Yes . Education has been provided regarding the importance of this vaccine but still declined. Advised may receive this vaccine at local pharmacy or Health Dept. Aware to provide a copy of the vaccination record if obtained from local pharmacy or Health Dept. Verbalized acceptance and understanding.   Cancer Screenings:  Colorectal Screening: Cologuard completed 05/28/17. Repeat every 3 years;   Lung Cancer Screening: (Low Dose CT Chest recommended if Age 72-80 years, 30 pack-year currently smoking OR have quit w/in 15years.) does qualify. Completed 10/07/19.    Additional Screening:  Hepatitis C Screening: does qualify; postponed  Vision Screening: Recommended annual ophthalmology exams for early detection of glaucoma and other disorders of the eye. Is the patient up to date with their annual eye exam?  Yes  Who is the provider or what is the name of the office in which the pt attends annual eye exams? [place] Screening: Recommended annual dental exams for proper oral hygiene  Community Resource Referral:  CRR required this visit?  No       Plan:    I have personally reviewed and addressed the Medicare Annual Wellness questionnaire and have noted the following in the patient's chart:  A. Medical and social history B. Use of alcohol, tobacco or illicit drugs  C. Current medications and supplements D. Functional ability and status E.  Nutritional status F.  Physical activity G. Advance directives H. List of other physicians I.  Hospitalizations, surgeries, and ER visits in previous 12 months J.  [place] such as hearing and vision if needed, cognitive and depression L. Referrals and appointments   In addition, I have reviewed and discussed with patient certain preventive protocols, quality metrics, and best practice recommendations. A written personalized care  plan for preventive services as well as general preventive health recommendations were provided to patient.   Signed,  Clemetine Marker, LPN Nurse Health Advisor    Nurse Notes: none

## 2019-12-11 NOTE — Telephone Encounter (Signed)
Please have Andres Mullins come in for recheck Vit D to see if he needs to continue rx or start OTC  Please Order vit d level For dx vit d deficiency

## 2019-12-12 NOTE — Telephone Encounter (Signed)
Left vm orders entered

## 2019-12-12 NOTE — Addendum Note (Signed)
Addended by: Docia Furl on: 12/12/2019 09:21 AM   Modules accepted: Orders

## 2019-12-25 ENCOUNTER — Telehealth: Payer: Self-pay | Admitting: *Deleted

## 2019-12-25 ENCOUNTER — Other Ambulatory Visit: Payer: Self-pay | Admitting: Family Medicine

## 2019-12-25 DIAGNOSIS — D72820 Lymphocytosis (symptomatic): Secondary | ICD-10-CM

## 2019-12-25 DIAGNOSIS — C9110 Chronic lymphocytic leukemia of B-cell type not having achieved remission: Secondary | ICD-10-CM

## 2019-12-25 NOTE — Telephone Encounter (Signed)
If patient would like to be evaluated by Dr. B, we can see him tomorrow at 8:30 am.

## 2019-12-25 NOTE — Telephone Encounter (Signed)
Md - agreeable to see pt in am. With labs- cbc, metc, ldh.   Called patient - offered pt apt in clinic tom. Pt accepted apt at 8:30 am.  Pt reports that the lymph node has "changed character. There is swelling around the lymph node. The area if now puffy over my clavicle. I noticed this yesterday evening."

## 2019-12-25 NOTE — Telephone Encounter (Signed)
Patient called asking for an appointment due to an enlarging lymph node on left clavicle over past few days. Recently diagnosed with CLL. Please advise

## 2019-12-26 ENCOUNTER — Inpatient Hospital Stay: Payer: Medicare PPO | Attending: Internal Medicine

## 2019-12-26 ENCOUNTER — Inpatient Hospital Stay: Payer: Medicare PPO | Attending: Internal Medicine | Admitting: Internal Medicine

## 2019-12-26 ENCOUNTER — Other Ambulatory Visit: Payer: Self-pay

## 2019-12-26 ENCOUNTER — Encounter: Payer: Self-pay | Admitting: Internal Medicine

## 2019-12-26 VITALS — BP 120/92 | HR 77 | Temp 95.3°F | Resp 20 | Wt 170.5 lb

## 2019-12-26 DIAGNOSIS — C9110 Chronic lymphocytic leukemia of B-cell type not having achieved remission: Secondary | ICD-10-CM | POA: Insufficient documentation

## 2019-12-26 DIAGNOSIS — E785 Hyperlipidemia, unspecified: Secondary | ICD-10-CM | POA: Diagnosis not present

## 2019-12-26 DIAGNOSIS — Z809 Family history of malignant neoplasm, unspecified: Secondary | ICD-10-CM | POA: Insufficient documentation

## 2019-12-26 DIAGNOSIS — R590 Localized enlarged lymph nodes: Secondary | ICD-10-CM | POA: Insufficient documentation

## 2019-12-26 DIAGNOSIS — Z7289 Other problems related to lifestyle: Secondary | ICD-10-CM | POA: Insufficient documentation

## 2019-12-26 DIAGNOSIS — D72820 Lymphocytosis (symptomatic): Secondary | ICD-10-CM | POA: Insufficient documentation

## 2019-12-26 DIAGNOSIS — F17210 Nicotine dependence, cigarettes, uncomplicated: Secondary | ICD-10-CM | POA: Diagnosis not present

## 2019-12-26 DIAGNOSIS — Z818 Family history of other mental and behavioral disorders: Secondary | ICD-10-CM | POA: Insufficient documentation

## 2019-12-26 DIAGNOSIS — Z8249 Family history of ischemic heart disease and other diseases of the circulatory system: Secondary | ICD-10-CM | POA: Insufficient documentation

## 2019-12-26 DIAGNOSIS — Z79899 Other long term (current) drug therapy: Secondary | ICD-10-CM | POA: Insufficient documentation

## 2019-12-26 LAB — CBC WITH DIFFERENTIAL/PLATELET
Abs Immature Granulocytes: 0.05 10*3/uL (ref 0.00–0.07)
Basophils Absolute: 0.1 10*3/uL (ref 0.0–0.1)
Basophils Relative: 1 %
Eosinophils Absolute: 0.3 10*3/uL (ref 0.0–0.5)
Eosinophils Relative: 2 %
HCT: 48.1 % (ref 39.0–52.0)
Hemoglobin: 16.3 g/dL (ref 13.0–17.0)
Immature Granulocytes: 0 %
Lymphocytes Relative: 55 %
Lymphs Abs: 9.7 10*3/uL — ABNORMAL HIGH (ref 0.7–4.0)
MCH: 30.3 pg (ref 26.0–34.0)
MCHC: 33.9 g/dL (ref 30.0–36.0)
MCV: 89.4 fL (ref 80.0–100.0)
Monocytes Absolute: 2.1 10*3/uL — ABNORMAL HIGH (ref 0.1–1.0)
Monocytes Relative: 12 %
Neutro Abs: 5.1 10*3/uL (ref 1.7–7.7)
Neutrophils Relative %: 30 %
Platelets: 221 10*3/uL (ref 150–400)
RBC: 5.38 MIL/uL (ref 4.22–5.81)
RDW: 13.4 % (ref 11.5–15.5)
Smear Review: NORMAL
WBC Morphology: REACTIVE
WBC: 17.2 10*3/uL — ABNORMAL HIGH (ref 4.0–10.5)
nRBC: 0 % (ref 0.0–0.2)

## 2019-12-26 LAB — COMPREHENSIVE METABOLIC PANEL
ALT: 32 U/L (ref 0–44)
AST: 22 U/L (ref 15–41)
Albumin: 4.1 g/dL (ref 3.5–5.0)
Alkaline Phosphatase: 75 U/L (ref 38–126)
Anion gap: 8 (ref 5–15)
BUN: 14 mg/dL (ref 8–23)
CO2: 25 mmol/L (ref 22–32)
Calcium: 9.6 mg/dL (ref 8.9–10.3)
Chloride: 103 mmol/L (ref 98–111)
Creatinine, Ser: 0.9 mg/dL (ref 0.61–1.24)
GFR calc Af Amer: >60 mL/min (ref >60)
GFR calc non Af Amer: >60 mL/min (ref >60)
Glucose, Bld: 120 mg/dL — ABNORMAL HIGH (ref 70–99)
Potassium: 4.1 mmol/L (ref 3.5–5.1)
Sodium: 136 mmol/L (ref 135–145)
Total Bilirubin: 0.7 mg/dL (ref 0.3–1.2)
Total Protein: 7 g/dL (ref 6.5–8.1)

## 2019-12-26 LAB — VITAMIN D 25 HYDROXY (VIT D DEFICIENCY, FRACTURES): Vit D, 25-Hydroxy: 51 ng/mL (ref 30–100)

## 2019-12-26 LAB — LACTATE DEHYDROGENASE: LDH: 156 U/L (ref 98–192)

## 2019-12-26 NOTE — Assessment & Plan Note (Addendum)
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  Clinical exam today worsening lymphadenopathy.  Recommend a CT scan neck for further evaluation.  #Today white count is 17,000; normal platelets and hemoglobin.  Ordered IV GH mutation status/and also FISH panel.  #I had a long discussion with patient regarding the importance of above testing-in helping US guide the natural course of disease.  However treatment decisions [TKI vs-monoclonal antibody treatment]-based upon symptoms/signs of lymph nodes etc.  #Lung cancer screening program-reviewed today.   # DISPOSITION: # CT scan neck in 1 week # follow up in 2 weeks-MD;video-mychart- Dr.B  # I reviewed the blood work- with the patient in detail; also reviewed the imaging independently [as summarized above]; and with the patient in detail.

## 2019-12-26 NOTE — Progress Notes (Signed)
[place] OFFICE PROGRESS NOTE  [facility] Care Team: [name], PA-C as PCP - General (Family Medicine) Sanda Klein, Satira Anis, MD as Attending Physician (Family Medicine) Brendolyn Patty, MD (Dermatology) Billey Co, MD as Consulting Physician (Urology)   SUMMARY OF ONCOLOGIC HISTORY:  Oncology History Overview Note  # 2016- CLL-[flow] CD5 (+)/CD23 (+) clonal B-cell population, CLL/SLL phenotype, 18% of leukocytes, <5,000/uL, CD38 positive   # 2020 PET [incidental]- Generalized Lymphadenopathy- left supraclav/ bil ax LN L> R; mesenteric LN; pelvic LN  # Lung cancer screening- on LSCP     CLL (chronic lymphocytic leukemia) ([place])  10/29/2019 Initial Diagnosis   CLL (chronic lymphocytic leukemia) ([place])      INTERVAL HISTORY:  A pleasant Andres Mullins with CLL/SLL is here for follow-up/given the concerns of worsening swelling in the left neck.  Mullins denies any unusual fatigue.  Denies any unusual cough or shortness of breath.  No fevers or chills.   Review of Systems  Constitutional: Negative for chills, diaphoresis, fever, malaise/fatigue and weight loss.  HENT: Negative for nosebleeds and sore throat.   Eyes: Negative for double vision.  Respiratory: Negative for cough, hemoptysis, sputum production, shortness of breath and wheezing.   Cardiovascular: Negative for chest pain, palpitations, orthopnea and leg swelling.  Gastrointestinal: Negative for abdominal pain, blood in stool, constipation, diarrhea, heartburn, melena, nausea and vomiting.  Genitourinary: Negative for dysuria, frequency and urgency.  Musculoskeletal: Negative for back pain and joint pain.  Skin: Negative.  Negative for itching and rash.  Neurological: Negative for dizziness, tingling, focal weakness, weakness and headaches.  Endo/Heme/Allergies: Does not bruise/bleed easily.  Psychiatric/Behavioral: Negative for depression. The Mullins is not nervous/anxious and does not have  insomnia.      PAST MEDICAL HISTORY :  Past Medical History:  Diagnosis Date  . Atherosclerosis of coronary artery 09/28/2016   Chest CT Nov 2017  . Elevated C-reactive protein (CRP)   . Elevated PSA   . Fractured sternum 1999  . Herpes   . History of rib fracture 1999   multiple  . Hyperlipidemia   . Psoriasis   . Rheumatoid factor positive April 2015  . Rosacea   . Thoracic aortic atherosclerosis ([place]) 09/28/2016   Chest CT Nov 2017  . Tobacco abuse   . Vitamin D deficiency disease     PAST SURGICAL HISTORY :   Past Surgical History:  Procedure Laterality Date  . APPENDECTOMY  1974  . SHOULDER ARTHROSCOPY  10/23/2016   Procedure: ARTHROSCOPY SHOULDER WITH DISTAL CLAVICLE EXCISION, PARTIAL ACROMIONECTOMY, AND DEBRIDEMENT;  Surgeon: Earnestine Leys, MD;  Location: [facility] ORS;  Service: Orthopedics;;  . TIBIA FRACTURE SURGERY Left 2004   titanium rod, ORIF    FAMILY HISTORY :   Family History  Problem Relation Age of Onset  . Heart disease Father   . Osteoarthritis Father   . Alzheimer's disease Father   . Cancer Neg Hx   . Diabetes Neg Hx   . Hypertension Neg Hx   . Stroke Neg Hx   . COPD Neg Hx     SOCIAL HISTORY:   Social History   Tobacco Use  . Smoking status: Current Every Day Smoker    Packs/day: 0.50    Years: 44.00    Pack years: 22.00    Types: Cigarettes  . Smokeless tobacco: Never Used  Substance Use Topics  . Alcohol use: Yes    Alcohol/week: 3.0 standard drinks    Types: 3 Glasses of wine per week  Comment: a couple glasses of wine a week  . Drug use: No    ALLERGIES:  is allergic to gluten meal.  MEDICATIONS:  Current Outpatient Medications  Medication Sig Dispense Refill  . Vitamin D, Ergocalciferol, (DRISDOL) 1.25 MG (50000 UT) CAPS capsule Take 1 capsule (50,000 Units total) by mouth every 7 (seven) days. x12 weeks. 12 capsule 0  . doxycycline (VIBRA-TABS) 100 MG tablet Take 1 tablet (100 mg total) by mouth 2 (two) times daily.  (Mullins not taking: Reported on 12/25/2019) 10 tablet 5   No current facility-administered medications for this visit.    PHYSICAL EXAMINATION: ECOG PERFORMANCE STATUS: 0 - Asymptomatic  BP (!) 120/92 (BP Location: Left Arm, Mullins Position: Sitting, Cuff Size: Normal)   Pulse 77   Temp (!) 95.3 F (35.2 C) (Tympanic)   Resp 20   Wt 170 lb 8 oz (77.3 kg)   BMI 28.37 kg/m   Filed Weights   12/26/19 0846  Weight: 170 lb 8 oz (77.3 kg)    Physical Exam  Constitutional: He is oriented to person, place, and time and well-developed, well-nourished, and in no distress.  Alone.  Approximately ~2cm lymph node felt in the left supraclavicular region (appears more puffy/prominent).  Approximate 2 cm lymph node felt in the left underarm; 1 cm lymph node in the right underarm.  Approximately 1 to 2 cm lymph node felt in the right groin.  HENT:  Head: Normocephalic and atraumatic.  Mouth/Throat: Oropharynx is clear and moist. No oropharyngeal exudate.  Eyes: Pupils are equal, round, and reactive to light.  Cardiovascular: Normal rate and regular rhythm.  Pulmonary/Chest: No respiratory distress. He has no wheezes.  Abdominal: Soft. Bowel sounds are normal. He exhibits no distension and no mass. There is no abdominal tenderness. There is no rebound and no guarding.  Musculoskeletal:        General: No tenderness or edema. Normal range of motion.     Cervical back: Normal range of motion and neck supple.  Neurological: He is alert and oriented to person, place, and time.  Skin: Skin is warm.  Psychiatric: Affect normal.    LABORATORY DATA:  I have reviewed the data as listed    Component Value Date/Time   NA 136 12/26/2019 0842   K 4.1 12/26/2019 0842   CL 103 12/26/2019 0842   CO2 25 12/26/2019 0842   GLUCOSE 120 (H) 12/26/2019 0842   BUN 14 12/26/2019 0842   CREATININE 0.90 12/26/2019 0842   CREATININE 0.84 10/06/2019 0000   CALCIUM 9.6 12/26/2019 0842   PROT 7.0 12/26/2019  0842   ALBUMIN 4.1 12/26/2019 0842   AST 22 12/26/2019 0842   ALT 32 12/26/2019 0842   ALKPHOS 75 12/26/2019 0842   BILITOT 0.7 12/26/2019 0842   GFRNONAA >60 12/26/2019 0842   GFRNONAA 91 10/06/2019 0000   GFRAA >60 12/26/2019 0842   GFRAA 106 10/06/2019 0000    No results found for: SPEP, UPEP  Lab Results  Component Value Date   WBC 17.2 (H) 12/26/2019   NEUTROABS 5.1 12/26/2019   HGB 16.3 12/26/2019   HCT 48.1 12/26/2019   MCV 89.4 12/26/2019   PLT 221 12/26/2019      Chemistry      Component Value Date/Time   NA 136 12/26/2019 0842   K 4.1 12/26/2019 0842   CL 103 12/26/2019 0842   CO2 25 12/26/2019 0842   BUN 14 12/26/2019 0842   CREATININE 0.90 12/26/2019 0842   CREATININE 0.84  10/06/2019 0000      Component Value Date/Time   CALCIUM 9.6 12/26/2019 0842   ALKPHOS 75 12/26/2019 0842   AST 22 12/26/2019 0842   ALT 32 12/26/2019 0842   BILITOT 0.7 12/26/2019 0842       RADIOGRAPHIC STUDIES: I have personally reviewed the radiological images as listed and agreed with the findings in the report. No results found.   ASSESSMENT & PLAN:   .CLL (chronic lymphocytic leukemia) ([place]) #SLL/CLL. Mullins is asymptomatic- NOV 2020- PET- generalized Lymphadenopathy-1.7 cm left supraclav/~1 to 2 cm bil ax LN L> R; abdominal/pelvic adenopathy-low SUV-consistent with low-grade lymphoproliferative disorder.  Clinical exam today worsening lymphadenopathy.  Recommend a CT scan neck for further evaluation.  #Today white count is 17,000; normal platelets and hemoglobin.  Ordered IV GH mutation status/and also FISH panel.  #I had a long discussion with Mullins regarding the importance of above testing-in helping US guide the natural course of disease.  However treatment decisions [TKI vs-monoclonal antibody treatment]-based upon symptoms/signs of lymph nodes etc.  #Lung cancer screening program-reviewed today.   # DISPOSITION: # CT scan neck in 1 week # follow up in 2  weeks-MD;video-mychart- Dr.B  # I reviewed the blood work- with the Mullins in detail; also reviewed the imaging independently [as summarized above]; and with the Mullins in detail.        Orders Placed This Encounter  Procedures  . CT SOFT TISSUE NECK W CONTRAST    Standing Status:   Future    Standing Expiration Date:   03/25/2021    Order Specific Question:   ** REASON FOR EXAM (FREE TEXT)    Answer:   left ecnk swelling; SLL/CLL    Order Specific Question:   If indicated for the ordered procedure, I authorize the administration of contrast media per Radiology protocol    Answer:   Yes    Order Specific Question:   Preferred imaging location?    Answer:   [facility]    Order Specific Question:   Radiology Contrast Protocol - do NOT remove file path    Answer:   [email or web address]  . 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 Miscellaneous [facility] test (send-out)    Standing Status:   Future    Number of Occurrences:   1    Standing Expiration Date:   12/25/2020    Order Specific Question:   Test name / description:    Answer:   [id number] - CLL fish leukemia ([facility] miscel).        Cammie Sickle, MD 12/26/2019 10:33 AM

## 2019-12-31 ENCOUNTER — Encounter: Payer: Self-pay | Admitting: Internal Medicine

## 2020-01-01 ENCOUNTER — Other Ambulatory Visit: Payer: Self-pay

## 2020-01-01 ENCOUNTER — Ambulatory Visit
Admission: RE | Admit: 2020-01-01 | Discharge: 2020-01-01 | Disposition: A | Discharge status: Home / Self Care | Payer: Medicare PPO | Source: Ambulatory Visit | Attending: Internal Medicine | Admitting: Internal Medicine

## 2020-01-01 DIAGNOSIS — C9110 Chronic lymphocytic leukemia of B-cell type not having achieved remission: Secondary | ICD-10-CM | POA: Diagnosis present

## 2020-01-01 MED ORDER — IOHEXOL 300 MG/ML  SOLN
75.0000 mL | Freq: Once | INTRAMUSCULAR | Status: AC | PRN
  Administered 2020-01-01: 75 mL via INTRAVENOUS

## 2020-01-02 LAB — MISC LABCORP TEST (SEND OUT): Labcorp test code: 113753

## 2020-01-09 ENCOUNTER — Inpatient Hospital Stay: Payer: TRICARE For Life (TFL) | Attending: Internal Medicine | Admitting: Internal Medicine

## 2020-01-09 DIAGNOSIS — C9110 Chronic lymphocytic leukemia of B-cell type not having achieved remission: Secondary | ICD-10-CM

## 2020-01-09 LAB — MISC LABCORP TEST (SEND OUT): Labcorp test code: 510340

## 2020-01-09 NOTE — Assessment & Plan Note (Addendum)
#  SLL/CLL. Patient is asymptomatic- NOV 2020- PET- generalized Lymphadenopathy-1.7 cm left supraclav/~1 to 2 cm bil ax LN L> R; abdominal/pelvic adenopathy-low SUV-consistent with low-grade lymphoproliferative disorder; FEB 2021-CT scan neck shows 2.1 cm left neck lymph node; 2.5 cm left axilla adenopathy.  IGVH-status; FISH panel-trisomy 12  #January 2021 white count is 17,000; normal platelets and hemoglobin-stable patient continues to be symptomatic.  #Continue to hold off any therapy at this time-as patient continues to be symptomatic.  Would recommend treatment- pill/infusions if patient is clinically symptomatic.  Would repeat Ig VH status next visit/peripheral blood flow cytometry.  # DISPOSITION: # follow up in 2 months- labs- cbc/bmp/ldh; peripheral blood flow cytometry [ordered]- Dr.B  # I reviewed the blood work- with the patient in detail; also reviewed the imaging independently [as summarized above]; and with the patient in detail.

## 2020-01-09 NOTE — Progress Notes (Signed)
I connected with [name] on 01/09/2020 at  3:00 PM EST by video enabled telemedicine visit and verified that I am speaking with the correct person using two identifiers.  I discussed the limitations, risks, security and privacy concerns of performing an evaluation and management service by telemedicine and the availability of in-person appointments. I also discussed with the patient that there may be a patient responsible charge related to this service. The patient expressed understanding and agreed to proceed.    Other persons participating in the visit and their role in the encounter: RN/medical reconciliation Patient's location: Home Provider's location: Office  Oncology History Overview Note  # 2016- CLL-[flow] CD5 (+)/CD23 (+) clonal B-cell population, CLL/SLL phenotype, 18% of leukocytes, <5,000/uL, CD38 positive   # NOV 2020 PET [incidental]- Generalized Lymphadenopathy- left supraclav/ bil ax LN L> R; mesenteric LN; pelvic LN; FEB 5th 2021- 2.5cm left supraclav; Left ax LN- 2.4cm.   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  male with history of CLL-currently on surveillance is here for follow-up/review the results of the neck CT scan.  Patient noted to have worsening swelling of the left side of the neck; also noted to have mild swelling of the lymph nodes in the the left underarm.  No fevers no chills no night sweats.  Patient denies any unusual fatigue.  No weight loss.  No nausea no vomiting.    LABORATORY DATA:  I have reviewed the data as listed    Component Value Date/Time   NA 136 12/26/2019 0842   K 4.1 12/26/2019 0842   CL 103 12/26/2019 0842   CO2 25 12/26/2019 0842   GLUCOSE 120 (H) 12/26/2019 0842   BUN 14 12/26/2019 0842   CREATININE 0.90 12/26/2019 0842    CREATININE 0.84 10/06/2019 0000   CALCIUM 9.6 12/26/2019 0842   PROT 7.0 12/26/2019 0842   ALBUMIN 4.1 12/26/2019 0842   AST 22 12/26/2019 0842   ALT 32 12/26/2019 0842   ALKPHOS 75 12/26/2019 0842   BILITOT 0.7 12/26/2019 0842   GFRNONAA >60 12/26/2019 0842   GFRNONAA 91 10/06/2019 0000   GFRAA >60 12/26/2019 0842   GFRAA 106 10/06/2019 0000    No results found for: SPEP, UPEP  Lab Results  Component Value Date   WBC 17.2 (H) 12/26/2019   NEUTROABS 5.1 12/26/2019   HGB 16.3 12/26/2019   HCT 48.1 12/26/2019   MCV 89.4 12/26/2019   PLT 221 12/26/2019      Chemistry      Component Value Date/Time   NA 136 12/26/2019 0842   K 4.1 12/26/2019 0842   CL 103 12/26/2019 0842   CO2 25 12/26/2019 0842   BUN 14 12/26/2019 0842   CREATININE 0.90 12/26/2019 0842   CREATININE 0.84 10/06/2019 0000      Component Value Date/Time   CALCIUM 9.6 12/26/2019 0842   ALKPHOS 75 12/26/2019 0842   AST 22 12/26/2019 0842   ALT 32 12/26/2019 0842   BILITOT 0.7 12/26/2019 0842       RADIOGRAPHIC STUDIES: I have personally reviewed the radiological images as listed and agreed with the findings in the report. No results found.   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 Patient is asymptomatic- NOV 2020- PET- generalized Lymphadenopathy-1.7 cm  left supraclav/~1 to 2 cm bil ax LN L> R; abdominal/pelvic adenopathy-low SUV-consistent with low-grade lymphoproliferative disorder; FEB 2021-CT scan neck shows 2.1 cm left neck lymph node; 2.5 cm left axilla adenopathy.  IGVH-status; FISH panel-trisomy 12  #January 2021 white count is 17,000; normal platelets and hemoglobin-stable patient continues to be symptomatic.  #Continue to hold off any therapy at this time-as patient continues to be symptomatic.  Would recommend treatment- pill/infusions if patient is clinically symptomatic.  Would repeat Ig VH status next visit/peripheral blood flow cytometry.  #  DISPOSITION: # follow up in 2 months- labs- cbc/bmp/ldh; peripheral blood flow cytometry [ordered]- Dr.B  # I reviewed the blood work- with the patient in detail; also reviewed the imaging independently [as summarized above]; and with the patient in detail.        Orders Placed This Encounter  Procedures  . Flow cytometry panel-leukemia/lymphoma work-up    Standing Status:   Future    Standing Expiration Date:   02/12/2021  . CBC with Differential/Platelet    Standing Status:   Future    Standing Expiration Date:   02/12/2021  . Lactate dehydrogenase    Standing Status:   Future    Standing Expiration Date:   02/12/2021  . Basic metabolic panel    Standing Status:   Future    Standing Expiration Date:   02/12/2021   All questions were answered. The patient knows to call the clinic with any problems, questions or concerns.      Cammie Sickle, MD 01/12/2020 7:55 AM   Follow-up instructions:  I discussed the assessment and treatment plan with the patient.  The patient was provided an opportunity to ask questions and all were answered.  The patient agreed with the plan and demonstrated understanding of instructions.  The patient was advised to call back or seek an in person evaluation if the symptoms worsen or if the condition fails to improve as anticipated.   Dr. Charlaine Dalton [place] at [facility] 01/12/2020 7:55 AM

## 2020-01-12 ENCOUNTER — Other Ambulatory Visit: Payer: Self-pay | Admitting: Internal Medicine

## 2020-01-12 DIAGNOSIS — C9110 Chronic lymphocytic leukemia of B-cell type not having achieved remission: Secondary | ICD-10-CM

## 2020-01-13 ENCOUNTER — Telehealth: Payer: Self-pay | Admitting: Internal Medicine

## 2020-01-13 NOTE — Telephone Encounter (Signed)
On 2/15- spoke to pt re: results of [place] panel for prognostic purpose. Await repeat IGVH testing. No new recommendations.

## 2020-01-17 ENCOUNTER — Ambulatory Visit: Payer: Medicare PPO | Attending: Internal Medicine

## 2020-01-17 DIAGNOSIS — Z23 Encounter for immunization: Secondary | ICD-10-CM

## 2020-01-17 NOTE — Progress Notes (Signed)
   Covid-19 Vaccination Clinic  Name:  Andres Mullins    MRN: [id number] DOB: December 22, 1950  01/17/2020  [name] was observed post Covid-19 immunization for 15 minutes without incidence. He was provided with Vaccine Information Sheet and instruction to access the V-Safe system.   [name] was instructed to call 911 with any severe reactions post vaccine: Marland Kitchen Difficulty breathing  . Swelling of your face and throat  . A fast heartbeat  . A bad rash all over your body  . Dizziness and weakness    Immunizations Administered    Name Date Dose VIS Date Route   Pfizer COVID-19 Vaccine 01/17/2020  9:18 AM 0.3 mL 11/07/2019 Intramuscular   Manufacturer: [place]   Lot: [id number]   [place]: [id number]

## 2020-02-09 ENCOUNTER — Ambulatory Visit: Payer: TRICARE For Life (TFL) | Attending: Internal Medicine

## 2020-02-09 DIAGNOSIS — Z23 Encounter for immunization: Secondary | ICD-10-CM

## 2020-02-09 NOTE — Progress Notes (Signed)
   Covid-19 Vaccination Clinic  Name:  Andres Mullins    MRN: [id number] DOB: 08/02/1951  02/09/2020  [name] was observed post Covid-19 immunization for 15 minutes without incident. He was provided with Vaccine Information Sheet and instruction to access the V-Safe system.   [name] was instructed to call 911 with any severe reactions post vaccine: Marland Kitchen Difficulty breathing  . Swelling of face and throat  . A fast heartbeat  . A bad rash all over body  . Dizziness and weakness   Immunizations Administered    Name Date Dose VIS Date Route   Pfizer COVID-19 Vaccine 02/09/2020  8:46 AM 0.3 mL 11/07/2019 Intramuscular   Manufacturer: [place]   Lot: [id number]   [place]: [id number]

## 2020-03-01 ENCOUNTER — Encounter: Payer: Self-pay | Admitting: Internal Medicine

## 2020-03-02 ENCOUNTER — Other Ambulatory Visit: Payer: Self-pay | Admitting: *Deleted

## 2020-03-02 DIAGNOSIS — C9110 Chronic lymphocytic leukemia of B-cell type not having achieved remission: Secondary | ICD-10-CM

## 2020-03-02 DIAGNOSIS — D72820 Lymphocytosis (symptomatic): Secondary | ICD-10-CM

## 2020-03-05 ENCOUNTER — Inpatient Hospital Stay: Payer: TRICARE For Life (TFL)

## 2020-03-19 ENCOUNTER — Ambulatory Visit: Payer: TRICARE For Life (TFL) | Admitting: Internal Medicine

## 2020-04-28 ENCOUNTER — Inpatient Hospital Stay: Payer: Medicare PPO | Admitting: Internal Medicine

## 2020-04-28 ENCOUNTER — Inpatient Hospital Stay: Payer: Medicare PPO

## 2020-10-02 ENCOUNTER — Telehealth: Payer: Self-pay

## 2020-10-02 DIAGNOSIS — Z87891 Personal history of nicotine dependence: Secondary | ICD-10-CM

## 2020-10-02 DIAGNOSIS — Z122 Encounter for screening for malignant neoplasm of respiratory organs: Secondary | ICD-10-CM

## 2020-10-02 NOTE — Telephone Encounter (Signed)
Patient contacted and scheduled for CT lung screening scan on 11/18 at 1:15p at [facility]. Verified with patient smoking status. He is a current smoker and is smoking about 5 cigarettes per day currently and has been a smoker for approximately 45 years. No major changes in medical condition since last year. No changes in insurance since last year.

## 2020-10-04 NOTE — Telephone Encounter (Signed)
Current smoker, 45.75 pack year

## 2020-10-04 NOTE — Addendum Note (Signed)
Addended by: Lieutenant Diego on: 10/04/2020 10:23 AM   Modules accepted: Orders

## 2020-10-06 ENCOUNTER — Encounter: Payer: Self-pay | Admitting: Internal Medicine

## 2020-10-14 ENCOUNTER — Ambulatory Visit
Admission: RE | Admit: 2020-10-14 | Discharge: 2020-10-14 | Disposition: A | Discharge status: Home / Self Care | Payer: Medicare PPO | Source: Ambulatory Visit | Attending: Nurse Practitioner | Admitting: Nurse Practitioner

## 2020-10-14 ENCOUNTER — Other Ambulatory Visit: Payer: Self-pay

## 2020-10-14 DIAGNOSIS — Z122 Encounter for screening for malignant neoplasm of respiratory organs: Secondary | ICD-10-CM | POA: Diagnosis not present

## 2020-10-14 DIAGNOSIS — Z87891 Personal history of nicotine dependence: Secondary | ICD-10-CM | POA: Diagnosis not present

## 2020-10-19 ENCOUNTER — Encounter: Payer: Self-pay | Admitting: *Deleted

## 2020-12-14 ENCOUNTER — Telehealth: Payer: Self-pay | Admitting: Family Medicine

## 2020-12-14 NOTE — Telephone Encounter (Signed)
Pt called and is declining to have this appt. Please advise.

## 2020-12-14 NOTE — Telephone Encounter (Signed)
Copied from [place] [phone]. Topic: Medicare AWV >> Dec 14, 2020 10:49 AM Cher Nakai R wrote: Reason for CRM:   Left message for patient to call back and schedule Medicare Annual Wellness Visit (AWV) in office.   Please offer to do Virtual or telephone, if unable to come into the office.   Last AWV 12/11/2019  Please schedule at anytime with [place].  40 minute appointment  Any questions, please contact me at [phone]

## 2020-12-14 NOTE — Telephone Encounter (Signed)
Per message from [place] -patient has declined to reschedule AWVS.

## 2020-12-16 ENCOUNTER — Ambulatory Visit: Payer: TRICARE For Life (TFL)

## 2020-12-30 ENCOUNTER — Other Ambulatory Visit: Payer: Self-pay

## 2021-01-04 ENCOUNTER — Telehealth: Payer: Self-pay | Admitting: *Deleted

## 2021-01-04 ENCOUNTER — Encounter: Payer: Self-pay | Admitting: Internal Medicine

## 2021-01-04 DIAGNOSIS — C9110 Chronic lymphocytic leukemia of B-cell type not having achieved remission: Secondary | ICD-10-CM

## 2021-01-04 NOTE — Telephone Encounter (Signed)
H- I agree with your plan. Thanks- GB

## 2021-01-04 NOTE — Telephone Encounter (Signed)
Patient is back in town. He moved back from [place]. Transferring records back to Dr. B (available in [place]). Pt requested an apt to see Dr. Jacinto Reap. He prefers lab apt the day prior to the md apt and to see Dr. B in person on 2/17   apts made per his request. I spoke with patient via telephone call.  Dr. Jacinto Reap - please advise on lab orders- cbc, metc. ldh.

## 2021-01-12 ENCOUNTER — Inpatient Hospital Stay: Payer: Medicare PPO | Attending: Internal Medicine

## 2021-01-12 ENCOUNTER — Other Ambulatory Visit: Payer: Self-pay

## 2021-01-12 DIAGNOSIS — F17210 Nicotine dependence, cigarettes, uncomplicated: Secondary | ICD-10-CM | POA: Diagnosis not present

## 2021-01-12 DIAGNOSIS — Z79899 Other long term (current) drug therapy: Secondary | ICD-10-CM | POA: Diagnosis not present

## 2021-01-12 DIAGNOSIS — Z9049 Acquired absence of other specified parts of digestive tract: Secondary | ICD-10-CM | POA: Diagnosis not present

## 2021-01-12 DIAGNOSIS — Z8269 Family history of other diseases of the musculoskeletal system and connective tissue: Secondary | ICD-10-CM | POA: Insufficient documentation

## 2021-01-12 DIAGNOSIS — I2510 Atherosclerotic heart disease of native coronary artery without angina pectoris: Secondary | ICD-10-CM | POA: Insufficient documentation

## 2021-01-12 DIAGNOSIS — Z8249 Family history of ischemic heart disease and other diseases of the circulatory system: Secondary | ICD-10-CM | POA: Diagnosis not present

## 2021-01-12 DIAGNOSIS — Z818 Family history of other mental and behavioral disorders: Secondary | ICD-10-CM | POA: Insufficient documentation

## 2021-01-12 DIAGNOSIS — E785 Hyperlipidemia, unspecified: Secondary | ICD-10-CM | POA: Diagnosis not present

## 2021-01-12 DIAGNOSIS — C9110 Chronic lymphocytic leukemia of B-cell type not having achieved remission: Secondary | ICD-10-CM | POA: Diagnosis not present

## 2021-01-12 DIAGNOSIS — R590 Localized enlarged lymph nodes: Secondary | ICD-10-CM | POA: Diagnosis not present

## 2021-01-12 LAB — CBC WITH DIFFERENTIAL/PLATELET
Abs Immature Granulocytes: 0.04 10*3/uL (ref 0.00–0.07)
Basophils Absolute: 0.1 10*3/uL (ref 0.0–0.1)
Basophils Relative: 1 %
Eosinophils Absolute: 0.1 10*3/uL (ref 0.0–0.5)
Eosinophils Relative: 1 %
HCT: 43 % (ref 39.0–52.0)
Hemoglobin: 15.3 g/dL (ref 13.0–17.0)
Immature Granulocytes: 0 %
Lymphocytes Relative: 70 %
Lymphs Abs: 14 10*3/uL — ABNORMAL HIGH (ref 0.7–4.0)
MCH: 31.6 pg (ref 26.0–34.0)
MCHC: 35.6 g/dL (ref 30.0–36.0)
MCV: 88.8 fL (ref 80.0–100.0)
Monocytes Absolute: 1.9 10*3/uL — ABNORMAL HIGH (ref 0.1–1.0)
Monocytes Relative: 10 %
Neutro Abs: 3.6 10*3/uL (ref 1.7–7.7)
Neutrophils Relative %: 18 %
Platelets: 197 10*3/uL (ref 150–400)
RBC: 4.84 MIL/uL (ref 4.22–5.81)
RDW: 13.6 % (ref 11.5–15.5)
Smear Review: NORMAL
WBC Morphology: ABNORMAL
WBC: 19.8 10*3/uL — ABNORMAL HIGH (ref 4.0–10.5)
nRBC: 0 % (ref 0.0–0.2)

## 2021-01-12 LAB — COMPREHENSIVE METABOLIC PANEL
ALT: 19 U/L (ref 0–44)
AST: 18 U/L (ref 15–41)
Albumin: 3.9 g/dL (ref 3.5–5.0)
Alkaline Phosphatase: 73 U/L (ref 38–126)
Anion gap: 9 (ref 5–15)
BUN: 12 mg/dL (ref 8–23)
CO2: 25 mmol/L (ref 22–32)
Calcium: 9.3 mg/dL (ref 8.9–10.3)
Chloride: 104 mmol/L (ref 98–111)
Creatinine, Ser: 0.92 mg/dL (ref 0.61–1.24)
GFR, Estimated: >60 mL/min (ref >60)
Glucose, Bld: 98 mg/dL (ref 70–99)
Potassium: 4.2 mmol/L (ref 3.5–5.1)
Sodium: 138 mmol/L (ref 135–145)
Total Bilirubin: 0.8 mg/dL (ref 0.3–1.2)
Total Protein: 6.8 g/dL (ref 6.5–8.1)

## 2021-01-12 LAB — LACTATE DEHYDROGENASE: LDH: 178 U/L (ref 98–192)

## 2021-01-13 ENCOUNTER — Inpatient Hospital Stay (HOSPITAL_BASED_OUTPATIENT_CLINIC_OR_DEPARTMENT_OTHER): Payer: Medicare PPO | Admitting: Internal Medicine

## 2021-01-13 ENCOUNTER — Encounter: Payer: Self-pay | Admitting: Internal Medicine

## 2021-01-13 DIAGNOSIS — Z79899 Other long term (current) drug therapy: Secondary | ICD-10-CM | POA: Diagnosis not present

## 2021-01-13 DIAGNOSIS — Z9049 Acquired absence of other specified parts of digestive tract: Secondary | ICD-10-CM | POA: Diagnosis not present

## 2021-01-13 DIAGNOSIS — E785 Hyperlipidemia, unspecified: Secondary | ICD-10-CM | POA: Diagnosis not present

## 2021-01-13 DIAGNOSIS — R590 Localized enlarged lymph nodes: Secondary | ICD-10-CM | POA: Diagnosis not present

## 2021-01-13 DIAGNOSIS — I2510 Atherosclerotic heart disease of native coronary artery without angina pectoris: Secondary | ICD-10-CM | POA: Diagnosis not present

## 2021-01-13 DIAGNOSIS — C9110 Chronic lymphocytic leukemia of B-cell type not having achieved remission: Secondary | ICD-10-CM

## 2021-01-13 DIAGNOSIS — F17210 Nicotine dependence, cigarettes, uncomplicated: Secondary | ICD-10-CM | POA: Diagnosis not present

## 2021-01-13 DIAGNOSIS — Z8249 Family history of ischemic heart disease and other diseases of the circulatory system: Secondary | ICD-10-CM | POA: Diagnosis not present

## 2021-01-13 DIAGNOSIS — Z818 Family history of other mental and behavioral disorders: Secondary | ICD-10-CM | POA: Diagnosis not present

## 2021-01-13 NOTE — Progress Notes (Signed)
Patient here for follow up no concerns today.

## 2021-01-13 NOTE — Progress Notes (Signed)
[facility] OFFICE PROGRESS NOTE  Patient Care Team: [name], PA-C as PCP - General (Family Medicine) Sanda Klein, Satira Anis, MD as Attending Physician (Family Medicine) Brendolyn Patty, MD (Dermatology) Billey Co, MD as Consulting Physician (Urology)   SUMMARY OF ONCOLOGIC HISTORY:  Oncology History Overview Note  # 2016- CLL-[flow] CD5 (+)/CD23 (+) clonal B-cell population, CLL/SLL phenotype, 18% of leukocytes, <5,000/uL, CD38 positive   # NOV 2020 PET [incidental]- Generalized Lymphadenopathy- left supraclav/ bil ax LN L> R; mesenteric LN; pelvic LN; FEB 5th 2021- 2.5cm left supraclav; Left ax LN- 2.4cm.   AUG 2021-Head and neck: The brain demonstrates symmetric appearing diffuse physiologic uptake without focal finding to suggest abnormality. In the left posterior and medial maxillary cavity, there is an area of mucoid deposition seen within SUV max of 28.8. This was seen in the previous study of November. The specific bony erosion, clear-cut soft tissue lesion is not detected. This probably represents a chronic sinus disease focus. The bilateral optic nerves and extraocular muscles to the largest nodes are seen on axial image 98. 1 now demonstrates a short axis measurement of 3.3 cm and the other more medially, a short axis measurement of 2.5 cm. Demonstrate symmetric increased uptake. The left, much greater than right, jugulodigastric and supraclavicular nodes have increased significantly in size and avidity since the previous study. Both of these lymph nodes demonstrate an SUV max of 3.9. No largest lymph node in the right subclavicular space is seen on axial image 104, demonstrating an SUV max of 3.3, and a short axis measurement of 1.4 cm.   On axial image 102, a right paratracheal lymph node is again identified with increased avidity, located immediately posterior to the right thyroid pole. This area was noted previously, but now increased avidity is seen, with an SUV  max of 8.6 in this area. Multiple smaller, and less avid lymph nodes scattered throughout the left greater than right neck are seen, as well as supraclavicular and infraclavicular areas.   Thorax: Significant axillary lymph nodes have increased significantly in size. These are also left greater than right, with the largest on the left seen on image #114 with a short axis measurement of 2.2 cm and an SUV max of 4.3. The largest on the right as seen on image 111, demonstrating a short axis of 1.4 cm and an SUV max of 4.0. Too numerous to count smaller and similarly avid lymph nodes in both axillae are also noted. These are all larger than previously seen and in general, more avid.  Numerous small AP window and lesser mediastinal lymph nodes are seen with minimal increased avidity, appearing similar in size and avidity to the previous study. No other significant mediastinal adenopathy. No hilar adenopathy detected. These likely also represent lymphoproliferative disease.  Hyperinflation of COPD, bibasilar dependent atelectasis, some paucity of lung tissue and biapical scarring are noted without other focal pulmonary mass or nodule, or acute pulmonary parenchymal disease. Physiologic uptake in the left heart is seen, as expected. Small hiatal hernia is again identified.   Abdomen pelvis: Physiologic uptake in the liver, gut and urinary tract is again noted. No focal area of asymmetric uptake in the structures is seen. The spleen also demonstrates generalized increased uptake and stable splenomegaly, up to 14.2 cm. On image 198, a preaortic lymph node is detected of increased size since the previous study, now measuring up to 1.2 cm short axis.  Several large lymph nodes on image 203 of the mesentery are  noted. The largest of these, on image 201 and ureters up to 1.8 cm short axis. Despite the fact that measurable avidity is not detected in these are the surrounding lymph nodes, these are doubtless involved in the  lymphoproliferative disease described, and appear larger. Multiple smaller lymph nodes scattered throughout the mesentery are also seen.  Grossly enlarged right greater than left common iliac chain lymph nodes are again seen, throughout the pelvis and into the inguinal areas bilaterally. The most avid on the right is seen on image #260 to, demonstrates a short axis of 2.5 cm, and an SUV max of 4.9. The largest on the left is identified image #272, demonstrates a short axis of 2.0 cm, and an SUV max of 4.7. These are similar to the previously noted values.  Right greater than left inguinal adenopathy is seen, with the largest lymph node seen on the right image 276, demonstrating a short axis of 1.5 cm and an SUV max of 3.9. This is larger in size and avidity than previously seen. There are bilateral enlarged lymph nodes associated with the external iliac vessels on image 277. These measure 2.0 cm on the right in short axis, with an SUV max of 3.9.  On the left, image 278, the external iliac lymph node is larger at 1.8 cm short axis, but with an SUV max of only 3.8. Lesser avid and smaller lymph nodes on both sides are also detected.   Musculoskeletal: Scattered, insignificant uptake is detected in the muscles especially in the bilateral joints, without focal finding to suggest lymphoproliferative disease.   IMPRESSIONS:  Significant increase in numbers, short axis size, and to a large extent, avidity -in the lymph nodes of the left greater than right neck, supraclavicular and infraclavicular areas, bilateral axillary lymph nodes and common iliac chain, external iliac chain and inguinal lymph nodes, consistent with worsening lymphoproliferative disease. The mediastinal lymph nodes seen are still present and unchanged in size or [significantly] in avidity.    [place]- Bx- SLL/CLL.   # Lung cancer screening- on LSCP  # SURVIVORSHIP:   # GENETICS:   DIAGNOSIS:   STAGE:         ;   GOALS:  CURRENT/MOST RECENT THERAPY :       CLL (chronic lymphocytic leukemia) ([place])  10/29/2019 Initial Diagnosis   CLL (chronic lymphocytic leukemia) ([place])      INTERVAL HISTORY:  A pleasant 70year-old male patient with CLL/SLL is here for follow-up currently on surveillance is here for follow-up.  In the interim patient had moved to [place] to help with his mother-in-law/family.  He had been followed up in [place] for his CLL.  Patient had PET scan in August 2021/also had a lymph node biopsy of his neck.  At that time he was recommended continued surveillance.  Does admit to increase in size of the neck adenopathy and also underarm lymph nodes.  Denies any new shortness of breath or cough with no fevers or chills.  No drenching night sweats.  Review of Systems  Constitutional: Negative for chills, diaphoresis, fever, malaise/fatigue and weight loss.  HENT: Negative for nosebleeds and sore throat.   Eyes: Negative for double vision.  Respiratory: Negative for cough, hemoptysis, sputum production, shortness of breath and wheezing.   Cardiovascular: Negative for chest pain, palpitations, orthopnea and leg swelling.  Gastrointestinal: Negative for abdominal pain, blood in stool, constipation, diarrhea, heartburn, melena, nausea and vomiting.  Genitourinary: Negative for dysuria, frequency and urgency.  Musculoskeletal: Negative for back pain  and joint pain.  Skin: Negative.  Negative for itching and rash.  Neurological: Negative for dizziness, tingling, focal weakness, weakness and headaches.  Endo/Heme/Allergies: Does not bruise/bleed easily.  Psychiatric/Behavioral: Negative for depression. The patient is not nervous/anxious and does not have insomnia.      PAST MEDICAL HISTORY :  Past Medical History:  Diagnosis Date  . Atherosclerosis of coronary artery 09/28/2016   Chest CT Nov 2017  . Elevated C-reactive protein (CRP)   . Elevated PSA   . Fractured sternum 1999  .  Herpes   . History of rib fracture 1999   multiple  . Hyperlipidemia   . Psoriasis   . Rheumatoid factor positive April 2015  . Rosacea   . Thoracic aortic atherosclerosis ([place]) 09/28/2016   Chest CT Nov 2017  . Tobacco abuse   . Vitamin D deficiency disease     PAST SURGICAL HISTORY :   Past Surgical History:  Procedure Laterality Date  . APPENDECTOMY  1974  . SHOULDER ARTHROSCOPY  10/23/2016   Procedure: ARTHROSCOPY SHOULDER WITH DISTAL CLAVICLE EXCISION, PARTIAL ACROMIONECTOMY, AND DEBRIDEMENT;  Surgeon: Earnestine Leys, MD;  Location: [facility] ORS;  Service: Orthopedics;;  . TIBIA FRACTURE SURGERY Left 2004   titanium rod, ORIF    FAMILY HISTORY :   Family History  Problem Relation Age of Onset  . Heart disease Father   . Osteoarthritis Father   . Alzheimer's disease Father   . Cancer Neg Hx   . Diabetes Neg Hx   . Hypertension Neg Hx   . Stroke Neg Hx   . COPD Neg Hx     SOCIAL HISTORY:   Social History   Tobacco Use  . Smoking status: Current Every Day Smoker    Packs/day: 0.50    Years: 45.00    Pack years: 22.50    Types: Cigarettes  . Smokeless tobacco: Never Used  . Tobacco comment: 11/6- currently smoking 5 cigarettes a day   Vaping Use  . Vaping Use: Never used  Substance Use Topics  . Alcohol use: Yes    Alcohol/week: 3.0 standard drinks    Types: 3 Glasses of wine per week    Comment: a couple glasses of wine a week  . Drug use: No    ALLERGIES:  is allergic to gluten meal.  MEDICATIONS:  Current Outpatient Medications  Medication Sig Dispense Refill  . acetaminophen (TYLENOL) 500 MG tablet Take 1 tablet by mouth daily.    . Cholecalciferol 125 MCG (5000 UT) TABS Take 1 tablet by mouth daily.     No current facility-administered medications for this visit.    PHYSICAL EXAMINATION: ECOG PERFORMANCE STATUS: 0 - Asymptomatic  BP 117/85 (BP Location: Right Arm, Patient Position: Sitting)   Pulse 78   Temp (!) 97 F (36.1 C) (Tympanic)    Resp 16   Wt 170 lb 9.6 oz (77.4 kg)   SpO2 98%   BMI 27.54 kg/m   Filed Weights   01/13/21 1027  Weight: 170 lb 9.6 oz (77.4 kg)    Physical Exam Constitutional:      Comments: Alone.  Ambulating independently.  Bulky adenopathy noted left supraclavicular region.  Approximate 2 cm lymph node felt in the left underarm; 1 cm lymph node in the right underarm.  Approximately 1 to 2 cm lymph node felt in the right groin.  HENT:     Head: Normocephalic and atraumatic.     Mouth/Throat:     Pharynx: No oropharyngeal exudate.  Eyes:     Pupils: Pupils are equal, round, and reactive to light.  Cardiovascular:     Rate and Rhythm: Normal rate and regular rhythm.  Pulmonary:     Effort: No respiratory distress.     Breath sounds: No wheezing.  Abdominal:     General: Bowel sounds are normal. There is no distension.     Palpations: Abdomen is soft. There is no mass.     Tenderness: There is no abdominal tenderness. There is no guarding or rebound.  Musculoskeletal:        General: No tenderness. Normal range of motion.     Cervical back: Normal range of motion and neck supple.  Skin:    General: Skin is warm.  Neurological:     Mental Status: He is alert and oriented to person, place, and time.  Psychiatric:        Mood and Affect: Affect normal.     LABORATORY DATA:  I have reviewed the data as listed    Component Value Date/Time   NA 138 01/12/2021 1332   K 4.2 01/12/2021 1332   CL 104 01/12/2021 1332   CO2 25 01/12/2021 1332   GLUCOSE 98 01/12/2021 1332   BUN 12 01/12/2021 1332   CREATININE 0.92 01/12/2021 1332   CREATININE 0.84 10/06/2019 0000   CALCIUM 9.3 01/12/2021 1332   PROT 6.8 01/12/2021 1332   ALBUMIN 3.9 01/12/2021 1332   AST 18 01/12/2021 1332   ALT 19 01/12/2021 1332   ALKPHOS 73 01/12/2021 1332   BILITOT 0.8 01/12/2021 1332   GFRNONAA >60 01/12/2021 1332   GFRNONAA 91 10/06/2019 0000   GFRAA >60 12/26/2019 0842   GFRAA 106 10/06/2019 0000    No  results found for: SPEP, UPEP  Lab Results  Component Value Date   WBC 19.8 (H) 01/12/2021   NEUTROABS 3.6 01/12/2021   HGB 15.3 01/12/2021   HCT 43.0 01/12/2021   MCV 88.8 01/12/2021   PLT 197 01/12/2021      Chemistry      Component Value Date/Time   NA 138 01/12/2021 1332   K 4.2 01/12/2021 1332   CL 104 01/12/2021 1332   CO2 25 01/12/2021 1332   BUN 12 01/12/2021 1332   CREATININE 0.92 01/12/2021 1332   CREATININE 0.84 10/06/2019 0000      Component Value Date/Time   CALCIUM 9.3 01/12/2021 1332   ALKPHOS 73 01/12/2021 1332   AST 18 01/12/2021 1332   ALT 19 01/12/2021 1332   BILITOT 0.8 01/12/2021 1332       RADIOGRAPHIC STUDIES: I have personally reviewed the radiological images as listed and agreed with the findings in the report. No results found.   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 Patient is asymptomatic/however noted to have worsening neck swelling /axilla lymph node.  PET scan August 2021-[[place]]-showed progressive bulky neck adenopathy; above and below diaphragm.  Patient also underwent a biopsy-in [place] -no evidence of of transformation; SLL/CLL.    # FEB 2022- white count is 19.8 normal platelets and hemoglobin-stable patient continues to be asymptomatic.  #Continue to hold off any therapy at this time-as patient continues to be asymptomatic.  However given the bulky adenopathy in the neck, I think patient will need to start treatment in the next few to many months.  Recommend venetoclax plus rituximab. Would repeat Ig VH status next visit.  # DISPOSITION: # follow up in 3 months- MD; labs- cbc/cmp/ldh-Dr.B  No orders of the defined types were placed in this encounter.       Cammie Sickle, MD 01/13/2021 1:44 PM

## 2021-01-13 NOTE — Assessment & Plan Note (Addendum)
#  SLL/CLL. Patient is asymptomatic/however noted to have worsening neck swelling /axilla lymph node.  PET scan August 2021-[[place]]-showed progressive bulky neck adenopathy; above and below diaphragm.  Patient also underwent a biopsy-in [place] -no evidence of of transformation; SLL/CLL.    # FEB 2022- white count is 19.8 normal platelets and hemoglobin-stable patient continues to be asymptomatic.  #Continue to hold off any therapy at this time-as patient continues to be asymptomatic.  However given the bulky adenopathy in the neck, I think patient will need to start treatment in the next few to many months.  Recommend venetoclax plus rituximab. Would repeat Ig VH status next visit.  # DISPOSITION: # follow up in 3 months- MD; labs- cbc/cmp/ldh-Dr.B

## 2021-04-14 ENCOUNTER — Ambulatory Visit: Payer: TRICARE For Life (TFL) | Admitting: Internal Medicine

## 2021-04-14 ENCOUNTER — Other Ambulatory Visit: Payer: TRICARE For Life (TFL)

## 2021-05-05 ENCOUNTER — Inpatient Hospital Stay (HOSPITAL_BASED_OUTPATIENT_CLINIC_OR_DEPARTMENT_OTHER): Payer: Medicare PPO | Admitting: Internal Medicine

## 2021-05-05 ENCOUNTER — Inpatient Hospital Stay: Payer: Medicare PPO | Attending: Internal Medicine

## 2021-05-05 DIAGNOSIS — Z298 Encounter for other specified prophylactic measures: Secondary | ICD-10-CM | POA: Diagnosis not present

## 2021-05-05 DIAGNOSIS — F17210 Nicotine dependence, cigarettes, uncomplicated: Secondary | ICD-10-CM | POA: Diagnosis not present

## 2021-05-05 DIAGNOSIS — Z9049 Acquired absence of other specified parts of digestive tract: Secondary | ICD-10-CM | POA: Diagnosis not present

## 2021-05-05 DIAGNOSIS — Z79899 Other long term (current) drug therapy: Secondary | ICD-10-CM | POA: Insufficient documentation

## 2021-05-05 DIAGNOSIS — Z8249 Family history of ischemic heart disease and other diseases of the circulatory system: Secondary | ICD-10-CM | POA: Diagnosis not present

## 2021-05-05 DIAGNOSIS — C9110 Chronic lymphocytic leukemia of B-cell type not having achieved remission: Secondary | ICD-10-CM | POA: Insufficient documentation

## 2021-05-05 DIAGNOSIS — R590 Localized enlarged lymph nodes: Secondary | ICD-10-CM | POA: Diagnosis not present

## 2021-05-05 DIAGNOSIS — Z9225 Personal history of immunosupression therapy: Secondary | ICD-10-CM | POA: Insufficient documentation

## 2021-05-05 DIAGNOSIS — Z8261 Family history of arthritis: Secondary | ICD-10-CM | POA: Diagnosis not present

## 2021-05-05 DIAGNOSIS — Z818 Family history of other mental and behavioral disorders: Secondary | ICD-10-CM | POA: Diagnosis not present

## 2021-05-05 LAB — CBC WITH DIFFERENTIAL/PLATELET
Abs Immature Granulocytes: 0.02 10*3/uL (ref 0.00–0.07)
Basophils Absolute: 0.1 10*3/uL (ref 0.0–0.1)
Basophils Relative: 1 %
Eosinophils Absolute: 0.1 10*3/uL (ref 0.0–0.5)
Eosinophils Relative: 1 %
HCT: 40.2 % (ref 39.0–52.0)
Hemoglobin: 14 g/dL (ref 13.0–17.0)
Immature Granulocytes: 0 %
Lymphocytes Relative: 62 %
Lymphs Abs: 7 10*3/uL — ABNORMAL HIGH (ref 0.7–4.0)
MCH: 31.7 pg (ref 26.0–34.0)
MCHC: 34.8 g/dL (ref 30.0–36.0)
MCV: 91 fL (ref 80.0–100.0)
Monocytes Absolute: 1.4 10*3/uL — ABNORMAL HIGH (ref 0.1–1.0)
Monocytes Relative: 12 %
Neutro Abs: 2.7 10*3/uL (ref 1.7–7.7)
Neutrophils Relative %: 24 %
Platelets: 173 10*3/uL (ref 150–400)
RBC: 4.42 MIL/uL (ref 4.22–5.81)
RDW: 14 % (ref 11.5–15.5)
Smear Review: NORMAL
WBC: 11.2 10*3/uL — ABNORMAL HIGH (ref 4.0–10.5)
nRBC: 0 % (ref 0.0–0.2)

## 2021-05-05 LAB — BASIC METABOLIC PANEL
Anion gap: 8 (ref 5–15)
BUN: 13 mg/dL (ref 8–23)
CO2: 24 mmol/L (ref 22–32)
Calcium: 9.1 mg/dL (ref 8.9–10.3)
Chloride: 104 mmol/L (ref 98–111)
Creatinine, Ser: 0.91 mg/dL (ref 0.61–1.24)
GFR, Estimated: >60 mL/min (ref >60)
Glucose, Bld: 107 mg/dL — ABNORMAL HIGH (ref 70–99)
Potassium: 4.2 mmol/L (ref 3.5–5.1)
Sodium: 136 mmol/L (ref 135–145)

## 2021-05-05 LAB — LACTATE DEHYDROGENASE: LDH: 239 U/L — ABNORMAL HIGH (ref 98–192)

## 2021-05-05 NOTE — Assessment & Plan Note (Addendum)
#  SLL/CLL. Patient is asymptomatic/however noted to have worsening neck swelling /axilla lymph node.  PET scan August 2021-[[place]]-showed progressive bulky neck adenopathy; above and below diaphragm.  Patient also underwent a biopsy-in [place] -no evidence of of transformation; SLL/CLL [will need to review pathology].    #With continued clinical progression/worsening bulky adenopathy I think is reasonable to repeat a PET scan at this time to reassess/baseline.  Patient will likely need systemic therapy soon.  We will check peripheral blood flow cytometry.  I discussed the treatment options ibrutinib versus/Gazyva venetoclax.  We will have detailed discussion at next visit.   # EVUSHLED: # COVID EVUSHELD PROPHYLAXIS: Given the immunosuppressive effects of treatments I would recommend EVUSHELD prophylaxis.  IM injection x2 [same time]-cutdown risk of Covid infection/next 6 months. Patient is vaccinated to [place].   Ms.Causey, [place]-who will speak to the patient.  # DISPOSITION: #  PET scan ASAP # follow up in 2 weeks- Labs/LDH; -Dr.B

## 2021-05-05 NOTE — Progress Notes (Signed)
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   AUG 2021-Head and neck: The brain demonstrates symmetric appearing diffuse physiologic uptake without focal finding to suggest abnormality. In the left posterior and medial maxillary cavity, there is an area of mucoid deposition seen within SUV max of 28.8. This was seen in the previous study of November. The specific bony erosion, clear-cut soft tissue lesion is not detected. This probably represents a chronic sinus disease focus. The bilateral optic nerves and extraocular muscles to the largest nodes are seen on axial image 98. 1 now demonstrates a short axis measurement of 3.3 cm and the other more medially, a short axis measurement of 2.5 cm. Demonstrate symmetric increased uptake. The left, much greater than right, jugulodigastric and supraclavicular nodes have increased significantly in size and avidity since the previous study. Both of these lymph nodes demonstrate an SUV max of 3.9. No largest lymph node in the right subclavicular space is seen on axial image 104, demonstrating an SUV max of 3.3, and a short axis measurement of 1.4 cm.   On axial image 102, a right paratracheal lymph node is again identified with increased avidity, located immediately posterior to the right thyroid pole. This area was noted previously, but now increased avidity is seen, with an SUV  max of 8.6 in this area. Multiple smaller, and less avid lymph nodes scattered throughout the left greater than right neck are seen, as well as supraclavicular and infraclavicular areas.   Thorax: Significant axillary lymph nodes have increased significantly in size. These are also left greater than right, with the largest on the left seen on image #114 with a short axis measurement of 2.2 cm and an SUV max of 4.3. The largest on the right as seen on image 111, demonstrating a short axis of 1.4 cm and an SUV max of 4.0. Too numerous to count smaller and similarly avid lymph nodes in both axillae are also noted. These are all larger than previously seen and in general, more avid.  Numerous small AP window and lesser mediastinal lymph nodes are seen with minimal increased avidity, appearing similar in size and avidity to the previous study. No other significant mediastinal adenopathy. No hilar adenopathy detected. These likely also represent lymphoproliferative disease.  Hyperinflation of COPD, bibasilar dependent atelectasis, some paucity of lung tissue and biapical scarring are noted without other focal pulmonary mass or nodule, or acute pulmonary parenchymal disease. Physiologic uptake in the left heart is seen, as expected. Small hiatal hernia is again identified.   Abdomen pelvis: Physiologic uptake in the liver, gut and urinary tract is again noted. No focal area of asymmetric uptake in the structures is seen. The spleen also demonstrates generalized increased uptake and stable splenomegaly, up to 14.2 cm. On image 198, a preaortic lymph node is detected of increased size since the previous study, now measuring up to 1.2 cm short axis.  Several large lymph nodes on image 203 of the mesentery are  noted. The largest of these, on image 201 and ureters up to 1.8 cm short axis. Despite the fact that measurable avidity is not detected in these are the surrounding lymph nodes, these are doubtless involved in the  lymphoproliferative disease described, and appear larger. Multiple smaller lymph nodes scattered throughout the mesentery are also seen.  Grossly enlarged right greater than left common iliac chain lymph nodes are again seen, throughout the pelvis and into the inguinal areas bilaterally. The most avid on the right is seen on image #260 to, demonstrates a short axis of 2.5 cm, and an SUV max of 4.9. The largest on the left is identified image #272, demonstrates a short axis of 2.0 cm, and an SUV max of 4.7. These are similar to the previously noted values.  Right greater than left inguinal adenopathy is seen, with the largest lymph node seen on the right image 276, demonstrating a short axis of 1.5 cm and an SUV max of 3.9. This is larger in size and avidity than previously seen. There are bilateral enlarged lymph nodes associated with the external iliac vessels on image 277. These measure 2.0 cm on the right in short axis, with an SUV max of 3.9.  On the left, image 278, the external iliac lymph node is larger at 1.8 cm short axis, but with an SUV max of only 3.8. Lesser avid and smaller lymph nodes on both sides are also detected.   Musculoskeletal: Scattered, insignificant uptake is detected in the muscles especially in the bilateral joints, without focal finding to suggest lymphoproliferative disease.   IMPRESSIONS:  Significant increase in numbers, short axis size, and to a large extent, avidity -in the lymph nodes of the left greater than right neck, supraclavicular and infraclavicular areas, bilateral axillary lymph nodes and common iliac chain, external iliac chain and inguinal lymph nodes, consistent with worsening lymphoproliferative disease. The mediastinal lymph nodes seen are still present and unchanged in size or [significantly] in avidity.    [place]- Bx- SLL/CLL.   # Lung cancer screening- on [facility]  # SURVIVORSHIP:   # GENETICS:   DIAGNOSIS:   STAGE:         ;   GOALS:  CURRENT/MOST RECENT THERAPY :       CLL (chronic lymphocytic leukemia) ([place])  10/29/2019 Initial Diagnosis   CLL (chronic lymphocytic leukemia) ([place])      INTERVAL HISTORY:  A pleasant 70 year-old male patient with CLL/SLL is here for follow-up currently on surveillance is here for follow-up.  He was noted to have worsening neck adenopathy and also underarm lymph nodes.  Denies any shortness of breath or cough.  No drenching night sweats.  Mild fatigue.   Review of Systems  Constitutional:  Negative for chills, diaphoresis, fever, malaise/fatigue and weight loss.  HENT:  Negative for nosebleeds and sore throat.   Eyes:  Negative for double vision.  Respiratory:  Negative for cough, hemoptysis, sputum production, shortness of breath and wheezing.   Cardiovascular:  Negative for chest pain, palpitations, orthopnea and leg swelling.  Gastrointestinal:  Negative for abdominal pain, blood in stool, constipation, diarrhea, heartburn, melena, nausea and vomiting.  Genitourinary:  Negative for dysuria, frequency and urgency.  Musculoskeletal:  Negative for back pain and joint pain.  Skin: Negative.  Negative for itching and rash.  Neurological:  Negative for dizziness, tingling, focal weakness, weakness and headaches.  Endo/Heme/Allergies:  Does not bruise/bleed easily.  Psychiatric/Behavioral:  Negative for depression. The patient is not nervous/anxious and does not  have insomnia.     PAST MEDICAL HISTORY :  Past Medical History:  Diagnosis Date   Atherosclerosis of coronary artery 09/28/2016   Chest CT Nov 2017   Elevated C-reactive protein (CRP)    Elevated PSA    Fractured sternum 1999   Herpes    History of rib fracture 1999   multiple   Hyperlipidemia    Psoriasis    Rheumatoid factor positive April 2015   Rosacea    Thoracic aortic atherosclerosis ([place]) 09/28/2016   Chest CT Nov 2017   Tobacco abuse    Vitamin D deficiency disease     PAST SURGICAL HISTORY  :   Past Surgical History:  Procedure Laterality Date   APPENDECTOMY  1974   SHOULDER ARTHROSCOPY  10/23/2016   Procedure: ARTHROSCOPY SHOULDER WITH DISTAL CLAVICLE EXCISION, PARTIAL ACROMIONECTOMY, AND DEBRIDEMENT;  Surgeon: Earnestine Leys, MD;  Location: [facility] ORS;  Service: Orthopedics;;   TIBIA FRACTURE SURGERY Left 2004   titanium rod, ORIF    FAMILY HISTORY :   Family History  Problem Relation Age of Onset   Heart disease Father    Osteoarthritis Father    Alzheimer's disease Father    Cancer Neg Hx    Diabetes Neg Hx    Hypertension Neg Hx    Stroke Neg Hx    COPD Neg Hx     SOCIAL HISTORY:   Social History   Tobacco Use   Smoking status: Every Day    Packs/day: 0.50    Years: 45.00    Pack years: 22.50    Types: Cigarettes   Smokeless tobacco: Never   Tobacco comments:    11/6- currently smoking 5 cigarettes a day   Vaping Use   Vaping Use: Never used  Substance Use Topics   Alcohol use: Yes    Alcohol/week: 3.0 standard drinks    Types: 3 Glasses of wine per week    Comment: a couple glasses of wine a week   Drug use: No    ALLERGIES:  is allergic to gluten meal.  MEDICATIONS:  Current Outpatient Medications  Medication Sig Dispense Refill   Cholecalciferol 125 MCG (5000 UT) TABS Take 1 tablet by mouth daily.     No current facility-administered medications for this visit.    PHYSICAL EXAMINATION: ECOG PERFORMANCE STATUS: 0 - Asymptomatic  BP 108/80 (BP Location: Left Arm, Patient Position: Sitting)   Pulse 75   Temp (!) 95.6 F (35.3 C) (Tympanic)   Wt 165 lb 6.4 oz (75 kg)   SpO2 98%   BMI 26.70 kg/m   Filed Weights   05/05/21 1012  Weight: 165 lb 6.4 oz (75 kg)    Physical Exam Constitutional:      Comments: Alone.  Ambulating independently.  Bulky adenopathy noted left supraclavicular region [8cm]. ~7 cm lymph node felt in the left underarm; 5cm lymph node in the right underarm.  ~3 cm  lymph node felt in the right and left groin.   HENT:     Head: Normocephalic and atraumatic.     Mouth/Throat:     Pharynx: No oropharyngeal exudate.  Eyes:     Pupils: Pupils are equal, round, and reactive to light.  Cardiovascular:     Rate and Rhythm: Normal rate and regular rhythm.  Pulmonary:     Effort: No respiratory distress.     Breath sounds: No wheezing.  Abdominal:     General: Bowel sounds are normal. There is no distension.  Palpations: Abdomen is soft. There is no mass.     Tenderness: no abdominal tenderness There is no guarding or rebound.  Musculoskeletal:        General: No tenderness. Normal range of motion.     Cervical back: Normal range of motion and neck supple.  Skin:    General: Skin is warm.  Neurological:     Mental Status: He is alert and oriented to person, place, and time.  Psychiatric:        Mood and Affect: Affect normal.    LABORATORY DATA:  I have reviewed the data as listed    Component Value Date/Time   NA 136 05/05/2021 0951   K 4.2 05/05/2021 0951   CL 104 05/05/2021 0951   CO2 24 05/05/2021 0951   GLUCOSE 107 (H) 05/05/2021 0951   BUN 13 05/05/2021 0951   CREATININE 0.91 05/05/2021 0951   CREATININE 0.84 10/06/2019 0000   CALCIUM 9.1 05/05/2021 0951   PROT 6.8 01/12/2021 1332   ALBUMIN 3.9 01/12/2021 1332   AST 18 01/12/2021 1332   ALT 19 01/12/2021 1332   ALKPHOS 73 01/12/2021 1332   BILITOT 0.8 01/12/2021 1332   GFRNONAA >60 05/05/2021 0951   GFRNONAA 91 10/06/2019 0000   GFRAA >60 12/26/2019 0842   GFRAA 106 10/06/2019 0000    No results found for: SPEP, UPEP  Lab Results  Component Value Date   WBC 11.2 (H) 05/05/2021   NEUTROABS 2.7 05/05/2021   HGB 14.0 05/05/2021   HCT 40.2 05/05/2021   MCV 91.0 05/05/2021   PLT 173 05/05/2021      Chemistry      Component Value Date/Time   NA 136 05/05/2021 0951   K 4.2 05/05/2021 0951   CL 104 05/05/2021 0951   CO2 24 05/05/2021 0951   BUN 13 05/05/2021 0951   CREATININE 0.91 05/05/2021 0951   CREATININE  0.84 10/06/2019 0000      Component Value Date/Time   CALCIUM 9.1 05/05/2021 0951   ALKPHOS 73 01/12/2021 1332   AST 18 01/12/2021 1332   ALT 19 01/12/2021 1332   BILITOT 0.8 01/12/2021 1332       RADIOGRAPHIC STUDIES: I have personally reviewed the radiological images as listed and agreed with the findings in the report. No results found.   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 Patient is asymptomatic/however noted to have worsening neck swelling /axilla lymph node.  PET scan August 2021-[[place]]-showed progressive bulky neck adenopathy; above and below diaphragm.  Patient also underwent a biopsy-in [place] -no evidence of of transformation; SLL/CLL [will need to review pathology].    #With continued clinical progression/worsening bulky adenopathy I think is reasonable to repeat a PET scan at this time to reassess/baseline.  Patient will likely need systemic therapy soon.  We will check peripheral blood flow cytometry.  I discussed the treatment options ibrutinib versus/Gazyva venetoclax.  We will have detailed discussion at next visit.   # EVUSHLED: # COVID EVUSHELD PROPHYLAXIS: Given the immunosuppressive effects of treatments I would recommend EVUSHELD prophylaxis.  IM injection x2 [same time]-cutdown risk of Covid infection/next 6 months. Patient is vaccinated to [place].   Ms.Causey, [place]-who will speak to the patient.  # DISPOSITION: #  PET scan ASAP # follow up in 2 weeks- Labs/LDH; -Dr.B     Orders Placed This Encounter  Procedures   NM PET Image Restag (PS) Skull Base To Thigh    Standing Status:   Future  Number of Occurrences:   1    Standing Expiration Date:   05/05/2022    Order Specific Question:   If indicated for the ordered procedure, I authorize the administration of a radiopharmaceutical per Radiology protocol    Answer:   Yes    Order Specific Question:   Preferred imaging location?    Answer:   [facility]    Lactate dehydrogenase    Standing Status:   Future    Standing Expiration Date:   05/19/2022   Hepatitis B core antibody, IgM    Standing Status:   Future    Standing Expiration Date:   05/19/2022   Hepatitis B surface antigen    Standing Status:   Future    Standing Expiration Date:   05/19/2022   Hepatitis C antibody    Standing Status:   Future    Standing Expiration Date:   05/19/2022   Hepatic function panel    Standing Status:   Future    Standing Expiration Date:   05/19/2022   Ambulatory Referral for Evusheld    Referral Priority:   Routine    Referral Type:   Consultation    Referral Reason:   Specialty Services Required    Number of Visits Requested:   1        Cammie Sickle, MD 05/19/2021 8:01 AM

## 2021-05-09 ENCOUNTER — Telehealth: Payer: Self-pay | Admitting: Adult Health

## 2021-05-09 LAB — COMP PANEL: LEUKEMIA/LYMPHOMA: Immunophenotypic Profile: 56

## 2021-05-09 NOTE — Telephone Encounter (Signed)
I called patient to discuss Evusheld, a long acting monoclonal antibody injection administered to patients with decreased immune systems or intolerance/allergy to the COVID 19 vaccine as COVID19 prevention.    Unable to reach patient.  LMOM to return my call.  Delorese Sellin, NP  

## 2021-05-11 ENCOUNTER — Other Ambulatory Visit: Payer: Self-pay | Admitting: Adult Health

## 2021-05-11 DIAGNOSIS — C9110 Chronic lymphocytic leukemia of B-cell type not having achieved remission: Secondary | ICD-10-CM

## 2021-05-11 NOTE — Progress Notes (Signed)
   I connected by phone with Andres Mullins on 05/11/2021, 11:54 AM to discuss the potential use of a new treatment, tixagevimab/cilgavimab, for pre-exposure prophylaxis for prevention of coronavirus disease 2019 (COVID-19) caused by the SARS-CoV-2 virus.  This patient is a 70 y.o. male that meets the FDA criteria for Emergency Use Authorization of tixagevimab/cilgavimab for pre-exposure prophylaxis of COVID-19 disease. Pt meets following criteria: Age >12 yr and weight > 40kg Not currently infected with SARS-CoV-2 and has no known recent exposure to an individual infected with SARS-CoV-2 AND Who has moderate to severe immune compromise due to a medical condition or receipt of immunosuppressive medications or treatments and may not mount an adequate immune response to COVID-19 vaccination or  Vaccination with any available COVID-19 vaccine, according to the approved or authorized schedule, is not recommended due to a history of severe adverse reaction (e.g., severe allergic reaction) to a COVID-19 vaccine(s) and/or COVID-19 vaccine component(s).  Patient meets the following definition of mod-severe immune compromised status: 6. Other actively treated hematologic malignancies or severe congenital immunodeficiency syndromes  I have spoken and communicated the following to the patient or parent/caregiver regarding COVID monoclonal antibody treatment:  FDA has authorized the emergency use of tixagevimab/cilgavimab for the pre-exposure prophylaxis of COVID-19 in patients with moderate-severe immunocompromised status, who meet above EUA criteria.  The significant known and potential risks and benefits of COVID monoclonal antibody, and the extent to which such potential risks and benefits are unknown.  Information on available alternative treatments and the risks and benefits of those alternatives, including clinical trials.  The patient or parent/caregiver has the option to accept or refuse COVID  monoclonal antibody treatment.  After reviewing this information with the patient, agree to receive tixagevimab/cilgavimab. High priority schedule message sent.  Scot Dock, NP, 05/11/2021, 11:54 AM

## 2021-05-13 LAB — IGVH SOMATIC HYPERMUTATION

## 2021-05-13 LAB — MISC LABCORP TEST (SEND OUT): Labcorp test code: 113753

## 2021-05-16 ENCOUNTER — Other Ambulatory Visit: Payer: Self-pay

## 2021-05-16 ENCOUNTER — Ambulatory Visit
Admission: RE | Admit: 2021-05-16 | Discharge: 2021-05-16 | Disposition: A | Discharge status: Home / Self Care | Payer: Medicare PPO | Source: Ambulatory Visit | Attending: Internal Medicine | Admitting: Internal Medicine

## 2021-05-16 DIAGNOSIS — I2510 Atherosclerotic heart disease of native coronary artery without angina pectoris: Secondary | ICD-10-CM | POA: Insufficient documentation

## 2021-05-16 DIAGNOSIS — J439 Emphysema, unspecified: Secondary | ICD-10-CM | POA: Insufficient documentation

## 2021-05-16 DIAGNOSIS — K449 Diaphragmatic hernia without obstruction or gangrene: Secondary | ICD-10-CM | POA: Insufficient documentation

## 2021-05-16 DIAGNOSIS — C9110 Chronic lymphocytic leukemia of B-cell type not having achieved remission: Secondary | ICD-10-CM | POA: Insufficient documentation

## 2021-05-16 DIAGNOSIS — C9111 Chronic lymphocytic leukemia of B-cell type in remission: Secondary | ICD-10-CM | POA: Diagnosis not present

## 2021-05-16 LAB — GLUCOSE, CAPILLARY: Glucose-Capillary: 92 mg/dL (ref 70–99)

## 2021-05-16 MED ORDER — FLUDEOXYGLUCOSE F - 18 (FDG) INJECTION
9.6400 | Freq: Once | INTRAVENOUS | Status: AC | PRN
  Administered 2021-05-16: 09:00:00 9.02 via INTRAVENOUS

## 2021-05-20 ENCOUNTER — Inpatient Hospital Stay: Payer: Medicare PPO

## 2021-05-20 ENCOUNTER — Inpatient Hospital Stay (HOSPITAL_BASED_OUTPATIENT_CLINIC_OR_DEPARTMENT_OTHER): Payer: Medicare PPO | Admitting: Internal Medicine

## 2021-05-20 ENCOUNTER — Encounter: Payer: Self-pay | Admitting: Internal Medicine

## 2021-05-20 ENCOUNTER — Other Ambulatory Visit: Payer: Self-pay

## 2021-05-20 VITALS — BP 110/67 | HR 84 | Temp 97.0°F | Resp 14 | Wt 165.7 lb

## 2021-05-20 DIAGNOSIS — Z79899 Other long term (current) drug therapy: Secondary | ICD-10-CM | POA: Diagnosis not present

## 2021-05-20 DIAGNOSIS — R590 Localized enlarged lymph nodes: Secondary | ICD-10-CM | POA: Diagnosis not present

## 2021-05-20 DIAGNOSIS — F17210 Nicotine dependence, cigarettes, uncomplicated: Secondary | ICD-10-CM | POA: Diagnosis not present

## 2021-05-20 DIAGNOSIS — Z9225 Personal history of immunosupression therapy: Secondary | ICD-10-CM | POA: Diagnosis not present

## 2021-05-20 DIAGNOSIS — Z298 Encounter for other specified prophylactic measures: Secondary | ICD-10-CM | POA: Diagnosis not present

## 2021-05-20 DIAGNOSIS — Z818 Family history of other mental and behavioral disorders: Secondary | ICD-10-CM | POA: Diagnosis not present

## 2021-05-20 DIAGNOSIS — Z95828 Presence of other vascular implants and grafts: Secondary | ICD-10-CM | POA: Diagnosis not present

## 2021-05-20 DIAGNOSIS — C9110 Chronic lymphocytic leukemia of B-cell type not having achieved remission: Secondary | ICD-10-CM

## 2021-05-20 DIAGNOSIS — Z8261 Family history of arthritis: Secondary | ICD-10-CM | POA: Diagnosis not present

## 2021-05-20 DIAGNOSIS — Z8249 Family history of ischemic heart disease and other diseases of the circulatory system: Secondary | ICD-10-CM | POA: Diagnosis not present

## 2021-05-20 DIAGNOSIS — Z9049 Acquired absence of other specified parts of digestive tract: Secondary | ICD-10-CM | POA: Diagnosis not present

## 2021-05-20 LAB — HEPATIC FUNCTION PANEL
ALT: 14 U/L (ref 0–44)
AST: 21 U/L (ref 15–41)
Albumin: 4 g/dL (ref 3.5–5.0)
Alkaline Phosphatase: 70 U/L (ref 38–126)
Bilirubin, Direct: <0.1 mg/dL (ref 0.0–0.2)
Total Bilirubin: 0.7 mg/dL (ref 0.3–1.2)
Total Protein: 6.8 g/dL (ref 6.5–8.1)

## 2021-05-20 LAB — LACTATE DEHYDROGENASE: LDH: 240 U/L — ABNORMAL HIGH (ref 98–192)

## 2021-05-20 LAB — HEPATITIS C ANTIBODY: HCV Ab: NONREACTIVE

## 2021-05-20 LAB — HEPATITIS B CORE ANTIBODY, IGM: Hep B C IgM: NONREACTIVE

## 2021-05-20 LAB — HEPATITIS B SURFACE ANTIGEN: Hepatitis B Surface Ag: NONREACTIVE

## 2021-05-20 MED ORDER — TIXAGEVIMAB (PART OF EVUSHELD) INJECTION
300.0000 mg | Freq: Once | INTRAMUSCULAR | Status: AC
  Administered 2021-05-20: 300 mg via INTRAMUSCULAR
  Filled 2021-05-20: qty 3

## 2021-05-20 MED ORDER — CILGAVIMAB (PART OF EVUSHELD) INJECTION
300.0000 mg | Freq: Once | INTRAMUSCULAR | Status: AC
  Administered 2021-05-20: 300 mg via INTRAMUSCULAR
  Filled 2021-05-20: qty 3

## 2021-05-20 NOTE — Progress Notes (Signed)
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   AUG 2021-Head and neck: The brain demonstrates symmetric appearing diffuse physiologic uptake without focal finding to suggest abnormality. In the left posterior and medial maxillary cavity, there is an area of mucoid deposition seen within SUV max of 28.8. This was seen in the previous study of November. The specific bony erosion, clear-cut soft tissue lesion is not detected. This probably represents a chronic sinus disease focus. The bilateral optic nerves and extraocular muscles to the largest nodes are seen on axial image 98. 1 now demonstrates a short axis measurement of 3.3 cm and the other more medially, a short axis measurement of 2.5 cm. Demonstrate symmetric increased uptake. The left, much greater than right, jugulodigastric and supraclavicular nodes have increased significantly in size and avidity since the previous study. Both of these lymph nodes demonstrate an SUV max of 3.9. No largest lymph node in the right subclavicular space is seen on axial image 104, demonstrating an SUV max of 3.3, and a short axis measurement of 1.4 cm.   On axial image 102, a right paratracheal lymph node is again identified with increased avidity, located immediately posterior to the right thyroid pole. This area was noted previously, but now increased avidity is seen, with an SUV  max of 8.6 in this area. Multiple smaller, and less avid lymph nodes scattered throughout the left greater than right neck are seen, as well as supraclavicular and infraclavicular areas.   Thorax: Significant axillary lymph nodes have increased significantly in size. These are also left greater than right, with the largest on the left seen on image #114 with a short axis measurement of 2.2 cm and an SUV max of 4.3. The largest on the right as seen on image 111, demonstrating a short axis of 1.4 cm and an SUV max of 4.0. Too numerous to count smaller and similarly avid lymph nodes in both axillae are also noted. These are all larger than previously seen and in general, more avid.  Numerous small AP window and lesser mediastinal lymph nodes are seen with minimal increased avidity, appearing similar in size and avidity to the previous study. No other significant mediastinal adenopathy. No hilar adenopathy detected. These likely also represent lymphoproliferative disease.  Hyperinflation of COPD, bibasilar dependent atelectasis, some paucity of lung tissue and biapical scarring are noted without other focal pulmonary mass or nodule, or acute pulmonary parenchymal disease. Physiologic uptake in the left heart is seen, as expected. Small hiatal hernia is again identified.   Abdomen pelvis: Physiologic uptake in the liver, gut and urinary tract is again noted. No focal area of asymmetric uptake in the structures is seen. The spleen also demonstrates generalized increased uptake and stable splenomegaly, up to 14.2 cm. On image 198, a preaortic lymph node is detected of increased size since the previous study, now measuring up to 1.2 cm short axis.  Several large lymph nodes on image 203 of the mesentery are  noted. The largest of these, on image 201 and ureters up to 1.8 cm short axis. Despite the fact that measurable avidity is not detected in these are the surrounding lymph nodes, these are doubtless involved in the  lymphoproliferative disease described, and appear larger. Multiple smaller lymph nodes scattered throughout the mesentery are also seen.  Grossly enlarged right greater than left common iliac chain lymph nodes are again seen, throughout the pelvis and into the inguinal areas bilaterally. The most avid on the right is seen on image #260 to, demonstrates a short axis of 2.5 cm, and an SUV max of 4.9. The largest on the left is identified image #272, demonstrates a short axis of 2.0 cm, and an SUV max of 4.7. These are similar to the previously noted values.  Right greater than left inguinal adenopathy is seen, with the largest lymph node seen on the right image 276, demonstrating a short axis of 1.5 cm and an SUV max of 3.9. This is larger in size and avidity than previously seen. There are bilateral enlarged lymph nodes associated with the external iliac vessels on image 277. These measure 2.0 cm on the right in short axis, with an SUV max of 3.9.  On the left, image 278, the external iliac lymph node is larger at 1.8 cm short axis, but with an SUV max of only 3.8. Lesser avid and smaller lymph nodes on both sides are also detected.   Musculoskeletal: Scattered, insignificant uptake is detected in the muscles especially in the bilateral joints, without focal finding to suggest lymphoproliferative disease.   IMPRESSIONS:  Significant increase in numbers, short axis size, and to a large extent, avidity -in the lymph nodes of the left greater than right neck, supraclavicular and infraclavicular areas, bilateral axillary lymph nodes and common iliac chain, external iliac chain and inguinal lymph nodes, consistent with worsening lymphoproliferative disease. The mediastinal lymph nodes seen are still present and unchanged in size or [significantly] in avidity.    [place]- Bx- SLL/CLL.   # Lung cancer screening- on LSCP  # SURVIVORSHIP:   # GENETICS:   DIAGNOSIS:   STAGE:         ;   GOALS:  CURRENT/MOST RECENT THERAPY :       CLL (chronic lymphocytic leukemia) ([place])  10/29/2019 Initial Diagnosis   CLL (chronic lymphocytic leukemia) ([place])   05/22/2021 -  Chemotherapy    Patient is on Treatment Plan: LYMPHOMA CLL/SLL VENETOCLAX + OBINUTUZUMAB Q28D          INTERVAL HISTORY:  A pleasant 70 year-old male patient with CLL/SLL is here for follow-up currently on surveillance is here for follow-up/review results of the PET scan  Patient continues to notice worsening neck adenopathy; also underarm lymph nodes.  No new shortness of breath or cough.  No drenching night sweats.  No fatigue.   Review of Systems  Constitutional:  Negative for chills, diaphoresis, fever, malaise/fatigue and weight loss.  HENT:  Negative for nosebleeds and sore throat.   Eyes:  Negative for double vision.  Respiratory:  Negative for cough, hemoptysis, sputum production, shortness of breath and wheezing.   Cardiovascular:  Negative for chest pain, palpitations, orthopnea and leg swelling.  Gastrointestinal:  Negative for abdominal pain, blood in stool, constipation, diarrhea, heartburn, melena, nausea and vomiting.  Genitourinary:  Negative for dysuria, frequency and urgency.  Musculoskeletal:  Negative for back pain and joint pain.  Skin: Negative.  Negative for itching and rash.  Neurological:  Negative for dizziness,  tingling, focal weakness, weakness and headaches.  Endo/Heme/Allergies:  Does not bruise/bleed easily.  Psychiatric/Behavioral:  Negative for depression. The patient is not nervous/anxious and does not have insomnia.     PAST MEDICAL HISTORY :  Past Medical History:  Diagnosis Date   Atherosclerosis of coronary artery 09/28/2016   Chest CT Nov 2017   Elevated C-reactive protein (CRP)    Elevated PSA    Fractured sternum 1999   Herpes    History of rib fracture 1999   multiple   Hyperlipidemia    Psoriasis    Rheumatoid factor positive April 2015   Rosacea     Thoracic aortic atherosclerosis ([place]) 09/28/2016   Chest CT Nov 2017   Tobacco abuse    Vitamin D deficiency disease     PAST SURGICAL HISTORY :   Past Surgical History:  Procedure Laterality Date   APPENDECTOMY  1974   SHOULDER ARTHROSCOPY  10/23/2016   Procedure: ARTHROSCOPY SHOULDER WITH DISTAL CLAVICLE EXCISION, PARTIAL ACROMIONECTOMY, AND DEBRIDEMENT;  Surgeon: Earnestine Leys, MD;  Location: [facility] ORS;  Service: Orthopedics;;   TIBIA FRACTURE SURGERY Left 2004   titanium rod, ORIF    FAMILY HISTORY :   Family History  Problem Relation Age of Onset   Heart disease Father    Osteoarthritis Father    Alzheimer's disease Father    Cancer Neg Hx    Diabetes Neg Hx    Hypertension Neg Hx    Stroke Neg Hx    COPD Neg Hx     SOCIAL HISTORY:   Social History   Tobacco Use   Smoking status: Every Day    Packs/day: 0.50    Years: 45.00    Pack years: 22.50    Types: Cigarettes   Smokeless tobacco: Never   Tobacco comments:    11/6- currently smoking 5 cigarettes a day   Vaping Use   Vaping Use: Never used  Substance Use Topics   Alcohol use: Yes    Alcohol/week: 3.0 standard drinks    Types: 3 Glasses of wine per week    Comment: a couple glasses of wine a week   Drug use: No    ALLERGIES:  is allergic to gluten meal.  MEDICATIONS:  Current Outpatient Medications  Medication Sig Dispense Refill   acyclovir (ZOVIRAX) 400 MG tablet Take 1 tablet (400 mg total) by mouth 2 (two) times daily. 60 tablet 4   allopurinol (ZYLOPRIM) 300 MG tablet Take 1 tablet (300 mg total) by mouth 2 (two) times daily. 120 tablet 0   lidocaine-prilocaine (EMLA) cream Apply 30 -45 mins prior to port access. 30 g 0   montelukast (SINGULAIR) 10 MG tablet Take 1 tablet (10 mg total) by mouth at bedtime. Start 2 days prior to infusion. Do not take on the day of infusion. 60 tablet 0   No current facility-administered medications for this visit.    PHYSICAL EXAMINATION: ECOG PERFORMANCE  STATUS: 0 - Asymptomatic  BP 110/67   Pulse 84   Temp (!) 97 F (36.1 C)   Resp 14   Wt 165 lb 10.8 oz (75.1 kg)   SpO2 100%   BMI 26.74 kg/m   Filed Weights   05/20/21 1014  Weight: 165 lb 10.8 oz (75.1 kg)    Physical Exam Constitutional:      Comments: Alone.  Ambulating independently.  Bulky adenopathy noted left supraclavicular region [8cm]. ~7 cm lymph node felt in the left underarm; 5cm lymph node in the right underarm.  ~  3 cm  lymph node felt in the right and left groin.  HENT:     Head: Normocephalic and atraumatic.     Mouth/Throat:     Pharynx: No oropharyngeal exudate.  Eyes:     Pupils: Pupils are equal, round, and reactive to light.  Cardiovascular:     Rate and Rhythm: Normal rate and regular rhythm.  Pulmonary:     Effort: No respiratory distress.     Breath sounds: No wheezing.  Abdominal:     General: Bowel sounds are normal. There is no distension.     Palpations: Abdomen is soft. There is no mass.     Tenderness: no abdominal tenderness There is no guarding or rebound.  Musculoskeletal:        General: No tenderness. Normal range of motion.     Cervical back: Normal range of motion and neck supple.  Skin:    General: Skin is warm.  Neurological:     Mental Status: He is alert and oriented to person, place, and time.  Psychiatric:        Mood and Affect: Affect normal.    LABORATORY DATA:  I have reviewed the data as listed    Component Value Date/Time   NA 136 05/05/2021 0951   K 4.2 05/05/2021 0951   CL 104 05/05/2021 0951   CO2 24 05/05/2021 0951   GLUCOSE 107 (H) 05/05/2021 0951   BUN 13 05/05/2021 0951   CREATININE 0.91 05/05/2021 0951   CREATININE 0.84 10/06/2019 0000   CALCIUM 9.1 05/05/2021 0951   PROT 6.8 05/20/2021 0957   ALBUMIN 4.0 05/20/2021 0957   AST 21 05/20/2021 0957   ALT 14 05/20/2021 0957   ALKPHOS 70 05/20/2021 0957   BILITOT 0.7 05/20/2021 0957   GFRNONAA >60 05/05/2021 0951   GFRNONAA 91 10/06/2019 0000    GFRAA >60 12/26/2019 0842   GFRAA 106 10/06/2019 0000    No results found for: SPEP, UPEP  Lab Results  Component Value Date   WBC 11.2 (H) 05/05/2021   NEUTROABS 2.7 05/05/2021   HGB 14.0 05/05/2021   HCT 40.2 05/05/2021   MCV 91.0 05/05/2021   PLT 173 05/05/2021      Chemistry      Component Value Date/Time   NA 136 05/05/2021 0951   K 4.2 05/05/2021 0951   CL 104 05/05/2021 0951   CO2 24 05/05/2021 0951   BUN 13 05/05/2021 0951   CREATININE 0.91 05/05/2021 0951   CREATININE 0.84 10/06/2019 0000      Component Value Date/Time   CALCIUM 9.1 05/05/2021 0951   ALKPHOS 70 05/20/2021 0957   AST 21 05/20/2021 0957   ALT 14 05/20/2021 0957   BILITOT 0.7 05/20/2021 0957       RADIOGRAPHIC STUDIES: I have personally reviewed the radiological images as listed and agreed with the findings in the report. No results found.   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 Patient is asymptomatic/however noted to have worsening neck swelling /axilla lymph node.  June 2022- Marked progression of lymphoproliferative process -axillary /neck retroperitoneal adenopathy.    # I had a long discussion with patient regarding initiation of therapy given bulky adenopathy I would recommend fixed duration of therapy with Gazyva x6-venetoclax X12 months.  Goal of treatment is to control the disease; not curative.  However remissions could be long.  #  Long discussion with patient regarding the potential side effects - Gazyva including but not limited to  risk of infections; infusion reactions etc.  Venetoclax-will be added with third cycle; minimize risk of tumor lysis.  Discussed the potential effects of venetoclax tumor lysis arthralgias myalgias; cytopenias; diarrhea.  Gazyva premedications would be recommended.  #Recommend shingles/TLS prophylaxis: Acyclovir/allopurinol.   # EVUSHLED: # COVID EVUSHELD PROPHYLAXIS: Given the immunosuppressive effects of treatments  I would recommend EVUSHELD prophylaxis.  IM injection x2 [same time]-cutdown risk of Covid infection/next 6 months. Patient is vaccinated to [place].   Ms.Causey, [place]-who will speak to the patient.  #Financial expense: Refer to Education officer, museum.   # IV access: Recommend port placement given the long infusions.  # DISPOSITION: # Financial referral # port referral' # Follow up on 20th-Wed []; MD; labs- cbc/cmp;uric acid/LDH; Dyann Kief D-1 & D-2  # Follow up -on 27th [[place]] MD; labs- cbc/cmp; uric acid;mag' phos LDH- Gazyva- Dr.B  Addendum: Discussed with Dr. Asquith-oncologist, [place]-reviewed the pathology of the lymph node biopsy in 2021-positive for SLL; negative for cyclin D1/1114 translocation.  Given the fax number/we will review pathology report when available.  Discussed with the patient regarding my discussion with Dr.Asquith.      Orders Placed This Encounter  Procedures   CBC with Differential/Platelet    Standing Status:   Future    Standing Expiration Date:   05/20/2022   Comprehensive metabolic panel    Standing Status:   Future    Standing Expiration Date:   05/20/2022   Uric acid    Standing Status:   Future    Standing Expiration Date:   05/20/2022   Lactate dehydrogenase    Standing Status:   Future    Standing Expiration Date:   05/20/2022   CBC with Differential/Platelet    Standing Status:   Future    Standing Expiration Date:   05/20/2022   Comprehensive metabolic panel    Standing Status:   Future    Standing Expiration Date:   05/20/2022   Uric acid    Standing Status:   Future    Standing Expiration Date:   05/20/2022   Magnesium    Standing Status:   Future    Standing Expiration Date:   05/20/2022   Lactate dehydrogenase    Standing Status:   Future    Standing Expiration Date:   05/20/2022   Ambulatory referral to Vascular Surgery    Referral Priority:   Urgent    Referral Type:   Surgical    Referral Reason:   Specialty Services Required     Referred to Provider:   Algernon Huxley, MD    Requested Specialty:   Vascular Surgery    Number of Visits Requested:   1        Cammie Sickle, MD 05/22/2021 5:53 PM

## 2021-05-20 NOTE — H&P (View-Only) (Signed)
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   AUG 2021-Head and neck: The brain demonstrates symmetric appearing diffuse physiologic uptake without focal finding to suggest abnormality. In the left posterior and medial maxillary cavity, there is an area of mucoid deposition seen within SUV max of 28.8. This was seen in the previous study of November. The specific bony erosion, clear-cut soft tissue lesion is not detected. This probably represents a chronic sinus disease focus. The bilateral optic nerves and extraocular muscles to the largest nodes are seen on axial image 98. 1 now demonstrates a short axis measurement of 3.3 cm and the other more medially, a short axis measurement of 2.5 cm. Demonstrate symmetric increased uptake. The left, much greater than right, jugulodigastric and supraclavicular nodes have increased significantly in size and avidity since the previous study. Both of these lymph nodes demonstrate an SUV max of 3.9. No largest lymph node in the right subclavicular space is seen on axial image 104, demonstrating an SUV max of 3.3, and a short axis measurement of 1.4 cm.   On axial image 102, a right paratracheal lymph node is again identified with increased avidity, located immediately posterior to the right thyroid pole. This area was noted previously, but now increased avidity is seen, with an SUV  max of 8.6 in this area. Multiple smaller, and less avid lymph nodes scattered throughout the left greater than right neck are seen, as well as supraclavicular and infraclavicular areas.   Thorax: Significant axillary lymph nodes have increased significantly in size. These are also left greater than right, with the largest on the left seen on image #114 with a short axis measurement of 2.2 cm and an SUV max of 4.3. The largest on the right as seen on image 111, demonstrating a short axis of 1.4 cm and an SUV max of 4.0. Too numerous to count smaller and similarly avid lymph nodes in both axillae are also noted. These are all larger than previously seen and in general, more avid.  Numerous small AP window and lesser mediastinal lymph nodes are seen with minimal increased avidity, appearing similar in size and avidity to the previous study. No other significant mediastinal adenopathy. No hilar adenopathy detected. These likely also represent lymphoproliferative disease.  Hyperinflation of COPD, bibasilar dependent atelectasis, some paucity of lung tissue and biapical scarring are noted without other focal pulmonary mass or nodule, or acute pulmonary parenchymal disease. Physiologic uptake in the left heart is seen, as expected. Small hiatal hernia is again identified.   Abdomen pelvis: Physiologic uptake in the liver, gut and urinary tract is again noted. No focal area of asymmetric uptake in the structures is seen. The spleen also demonstrates generalized increased uptake and stable splenomegaly, up to 14.2 cm. On image 198, a preaortic lymph node is detected of increased size since the previous study, now measuring up to 1.2 cm short axis.  Several large lymph nodes on image 203 of the mesentery are  noted. The largest of these, on image 201 and ureters up to 1.8 cm short axis. Despite the fact that measurable avidity is not detected in these are the surrounding lymph nodes, these are doubtless involved in the  lymphoproliferative disease described, and appear larger. Multiple smaller lymph nodes scattered throughout the mesentery are also seen.  Grossly enlarged right greater than left common iliac chain lymph nodes are again seen, throughout the pelvis and into the inguinal areas bilaterally. The most avid on the right is seen on image #260 to, demonstrates a short axis of 2.5 cm, and an SUV max of 4.9. The largest on the left is identified image #272, demonstrates a short axis of 2.0 cm, and an SUV max of 4.7. These are similar to the previously noted values.  Right greater than left inguinal adenopathy is seen, with the largest lymph node seen on the right image 276, demonstrating a short axis of 1.5 cm and an SUV max of 3.9. This is larger in size and avidity than previously seen. There are bilateral enlarged lymph nodes associated with the external iliac vessels on image 277. These measure 2.0 cm on the right in short axis, with an SUV max of 3.9.  On the left, image 278, the external iliac lymph node is larger at 1.8 cm short axis, but with an SUV max of only 3.8. Lesser avid and smaller lymph nodes on both sides are also detected.   Musculoskeletal: Scattered, insignificant uptake is detected in the muscles especially in the bilateral joints, without focal finding to suggest lymphoproliferative disease.   IMPRESSIONS:  Significant increase in numbers, short axis size, and to a large extent, avidity -in the lymph nodes of the left greater than right neck, supraclavicular and infraclavicular areas, bilateral axillary lymph nodes and common iliac chain, external iliac chain and inguinal lymph nodes, consistent with worsening lymphoproliferative disease. The mediastinal lymph nodes seen are still present and unchanged in size or [significantly] in avidity.    [place]- Bx- SLL/CLL.   # Lung cancer screening- on LSCP  # SURVIVORSHIP:   # GENETICS:   DIAGNOSIS:   STAGE:         ;   GOALS:  CURRENT/MOST RECENT THERAPY :       CLL (chronic lymphocytic leukemia) ([place])  10/29/2019 Initial Diagnosis   CLL (chronic lymphocytic leukemia) ([place])   05/22/2021 -  Chemotherapy    Patient is on Treatment Plan: LYMPHOMA CLL/SLL VENETOCLAX + OBINUTUZUMAB Q28D          INTERVAL HISTORY:  A pleasant 70 year-old male patient with CLL/SLL is here for follow-up currently on surveillance is here for follow-up/review results of the PET scan  Patient continues to notice worsening neck adenopathy; also underarm lymph nodes.  No new shortness of breath or cough.  No drenching night sweats.  No fatigue.   Review of Systems  Constitutional:  Negative for chills, diaphoresis, fever, malaise/fatigue and weight loss.  HENT:  Negative for nosebleeds and sore throat.   Eyes:  Negative for double vision.  Respiratory:  Negative for cough, hemoptysis, sputum production, shortness of breath and wheezing.   Cardiovascular:  Negative for chest pain, palpitations, orthopnea and leg swelling.  Gastrointestinal:  Negative for abdominal pain, blood in stool, constipation, diarrhea, heartburn, melena, nausea and vomiting.  Genitourinary:  Negative for dysuria, frequency and urgency.  Musculoskeletal:  Negative for back pain and joint pain.  Skin: Negative.  Negative for itching and rash.  Neurological:  Negative for dizziness,  tingling, focal weakness, weakness and headaches.  Endo/Heme/Allergies:  Does not bruise/bleed easily.  Psychiatric/Behavioral:  Negative for depression. The patient is not nervous/anxious and does not have insomnia.     PAST MEDICAL HISTORY :  Past Medical History:  Diagnosis Date   Atherosclerosis of coronary artery 09/28/2016   Chest CT Nov 2017   Elevated C-reactive protein (CRP)    Elevated PSA    Fractured sternum 1999   Herpes    History of rib fracture 1999   multiple   Hyperlipidemia    Psoriasis    Rheumatoid factor positive April 2015   Rosacea     Thoracic aortic atherosclerosis ([place]) 09/28/2016   Chest CT Nov 2017   Tobacco abuse    Vitamin D deficiency disease     PAST SURGICAL HISTORY :   Past Surgical History:  Procedure Laterality Date   APPENDECTOMY  1974   SHOULDER ARTHROSCOPY  10/23/2016   Procedure: ARTHROSCOPY SHOULDER WITH DISTAL CLAVICLE EXCISION, PARTIAL ACROMIONECTOMY, AND DEBRIDEMENT;  Surgeon: Earnestine Leys, MD;  Location: [facility] ORS;  Service: Orthopedics;;   TIBIA FRACTURE SURGERY Left 2004   titanium rod, ORIF    FAMILY HISTORY :   Family History  Problem Relation Age of Onset   Heart disease Father    Osteoarthritis Father    Alzheimer's disease Father    Cancer Neg Hx    Diabetes Neg Hx    Hypertension Neg Hx    Stroke Neg Hx    COPD Neg Hx     SOCIAL HISTORY:   Social History   Tobacco Use   Smoking status: Every Day    Packs/day: 0.50    Years: 45.00    Pack years: 22.50    Types: Cigarettes   Smokeless tobacco: Never   Tobacco comments:    11/6- currently smoking 5 cigarettes a day   Vaping Use   Vaping Use: Never used  Substance Use Topics   Alcohol use: Yes    Alcohol/week: 3.0 standard drinks    Types: 3 Glasses of wine per week    Comment: a couple glasses of wine a week   Drug use: No    ALLERGIES:  is allergic to gluten meal.  MEDICATIONS:  Current Outpatient Medications  Medication Sig Dispense Refill   acyclovir (ZOVIRAX) 400 MG tablet Take 1 tablet (400 mg total) by mouth 2 (two) times daily. 60 tablet 4   allopurinol (ZYLOPRIM) 300 MG tablet Take 1 tablet (300 mg total) by mouth 2 (two) times daily. 120 tablet 0   lidocaine-prilocaine (EMLA) cream Apply 30 -45 mins prior to port access. 30 g 0   montelukast (SINGULAIR) 10 MG tablet Take 1 tablet (10 mg total) by mouth at bedtime. Start 2 days prior to infusion. Do not take on the day of infusion. 60 tablet 0   No current facility-administered medications for this visit.    PHYSICAL EXAMINATION: ECOG PERFORMANCE  STATUS: 0 - Asymptomatic  BP 110/67   Pulse 84   Temp (!) 97 F (36.1 C)   Resp 14   Wt 165 lb 10.8 oz (75.1 kg)   SpO2 100%   BMI 26.74 kg/m   Filed Weights   05/20/21 1014  Weight: 165 lb 10.8 oz (75.1 kg)    Physical Exam Constitutional:      Comments: Alone.  Ambulating independently.  Bulky adenopathy noted left supraclavicular region [8cm]. ~7 cm lymph node felt in the left underarm; 5cm lymph node in the right underarm.  ~  3 cm  lymph node felt in the right and left groin.  HENT:     Head: Normocephalic and atraumatic.     Mouth/Throat:     Pharynx: No oropharyngeal exudate.  Eyes:     Pupils: Pupils are equal, round, and reactive to light.  Cardiovascular:     Rate and Rhythm: Normal rate and regular rhythm.  Pulmonary:     Effort: No respiratory distress.     Breath sounds: No wheezing.  Abdominal:     General: Bowel sounds are normal. There is no distension.     Palpations: Abdomen is soft. There is no mass.     Tenderness: no abdominal tenderness There is no guarding or rebound.  Musculoskeletal:        General: No tenderness. Normal range of motion.     Cervical back: Normal range of motion and neck supple.  Skin:    General: Skin is warm.  Neurological:     Mental Status: He is alert and oriented to person, place, and time.  Psychiatric:        Mood and Affect: Affect normal.    LABORATORY DATA:  I have reviewed the data as listed    Component Value Date/Time   NA 136 05/05/2021 0951   K 4.2 05/05/2021 0951   CL 104 05/05/2021 0951   CO2 24 05/05/2021 0951   GLUCOSE 107 (H) 05/05/2021 0951   BUN 13 05/05/2021 0951   CREATININE 0.91 05/05/2021 0951   CREATININE 0.84 10/06/2019 0000   CALCIUM 9.1 05/05/2021 0951   PROT 6.8 05/20/2021 0957   ALBUMIN 4.0 05/20/2021 0957   AST 21 05/20/2021 0957   ALT 14 05/20/2021 0957   ALKPHOS 70 05/20/2021 0957   BILITOT 0.7 05/20/2021 0957   GFRNONAA >60 05/05/2021 0951   GFRNONAA 91 10/06/2019 0000    GFRAA >60 12/26/2019 0842   GFRAA 106 10/06/2019 0000    No results found for: SPEP, UPEP  Lab Results  Component Value Date   WBC 11.2 (H) 05/05/2021   NEUTROABS 2.7 05/05/2021   HGB 14.0 05/05/2021   HCT 40.2 05/05/2021   MCV 91.0 05/05/2021   PLT 173 05/05/2021      Chemistry      Component Value Date/Time   NA 136 05/05/2021 0951   K 4.2 05/05/2021 0951   CL 104 05/05/2021 0951   CO2 24 05/05/2021 0951   BUN 13 05/05/2021 0951   CREATININE 0.91 05/05/2021 0951   CREATININE 0.84 10/06/2019 0000      Component Value Date/Time   CALCIUM 9.1 05/05/2021 0951   ALKPHOS 70 05/20/2021 0957   AST 21 05/20/2021 0957   ALT 14 05/20/2021 0957   BILITOT 0.7 05/20/2021 0957       RADIOGRAPHIC STUDIES: I have personally reviewed the radiological images as listed and agreed with the findings in the report. No results found.   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 Patient is asymptomatic/however noted to have worsening neck swelling /axilla lymph node.  June 2022- Marked progression of lymphoproliferative process -axillary /neck retroperitoneal adenopathy.    # I had a long discussion with patient regarding initiation of therapy given bulky adenopathy I would recommend fixed duration of therapy with Gazyva x6-venetoclax X12 months.  Goal of treatment is to control the disease; not curative.  However remissions could be long.  #  Long discussion with patient regarding the potential side effects - Gazyva including but not limited to  risk of infections; infusion reactions etc.  Venetoclax-will be added with third cycle; minimize risk of tumor lysis.  Discussed the potential effects of venetoclax tumor lysis arthralgias myalgias; cytopenias; diarrhea.  Gazyva premedications would be recommended.  #Recommend shingles/TLS prophylaxis: Acyclovir/allopurinol.   # EVUSHLED: # COVID EVUSHELD PROPHYLAXIS: Given the immunosuppressive effects of treatments  I would recommend EVUSHELD prophylaxis.  IM injection x2 [same time]-cutdown risk of Covid infection/next 6 months. Patient is vaccinated to [place].   Ms.Causey, [place]-who will speak to the patient.  #Financial expense: Refer to Education officer, museum.   # IV access: Recommend port placement given the long infusions.  # DISPOSITION: # Financial referral # port referral' # Follow up on 20th-Wed [[place]]; MD; labs- cbc/cmp;uric acid/LDH; Dyann Kief D-1 & D-2  # Follow up -on 27th [[place]] MD; labs- cbc/cmp; uric acid;mag' phos LDH- Gazyva- Dr.B  Addendum: Discussed with Dr. Asquith-oncologist, [place]-reviewed the pathology of the lymph node biopsy in 2021-positive for SLL; negative for cyclin D1/1114 translocation.  Given the fax number/we will review pathology report when available.  Discussed with the patient regarding my discussion with Dr.Asquith.      Orders Placed This Encounter  Procedures   CBC with Differential/Platelet    Standing Status:   Future    Standing Expiration Date:   05/20/2022   Comprehensive metabolic panel    Standing Status:   Future    Standing Expiration Date:   05/20/2022   Uric acid    Standing Status:   Future    Standing Expiration Date:   05/20/2022   Lactate dehydrogenase    Standing Status:   Future    Standing Expiration Date:   05/20/2022   CBC with Differential/Platelet    Standing Status:   Future    Standing Expiration Date:   05/20/2022   Comprehensive metabolic panel    Standing Status:   Future    Standing Expiration Date:   05/20/2022   Uric acid    Standing Status:   Future    Standing Expiration Date:   05/20/2022   Magnesium    Standing Status:   Future    Standing Expiration Date:   05/20/2022   Lactate dehydrogenase    Standing Status:   Future    Standing Expiration Date:   05/20/2022   Ambulatory referral to Vascular Surgery    Referral Priority:   Urgent    Referral Type:   Surgical    Referral Reason:   Specialty Services Required     Referred to Provider:   Algernon Huxley, MD    Requested Specialty:   Vascular Surgery    Number of Visits Requested:   1        Cammie Sickle, MD 05/22/2021 5:53 PM

## 2021-05-20 NOTE — Progress Notes (Signed)
Per Wilber Bihari, NP, patient only has to be monitored for 30 minutes compared to previous post observation period of 1 hour.    Patient monitored x 30 minutes post Evusheld injection. Patient and VSS. Discharged home.

## 2021-05-20 NOTE — Assessment & Plan Note (Addendum)
#  SLL/CLL. Patient is asymptomatic/however noted to have worsening neck swelling /axilla lymph node.  June 2022- Marked progression of lymphoproliferative process -axillary /neck retroperitoneal adenopathy.    # I had a long discussion with patient regarding initiation of therapy given bulky adenopathy I would recommend fixed duration of therapy with Gazyva x6-venetoclax X12 months.  Goal of treatment is to control the disease; not curative.  However remissions could be long.  #  Long discussion with patient regarding the potential side effects - Gazyva including but not limited to risk of infections; infusion reactions etc.  Venetoclax-will be added with third cycle; minimize risk of tumor lysis.  Discussed the potential effects of venetoclax tumor lysis arthralgias myalgias; cytopenias; diarrhea.  Gazyva premedications would be recommended.  #Recommend shingles/TLS prophylaxis: Acyclovir/allopurinol.   # EVUSHLED: # COVID EVUSHELD PROPHYLAXIS: Given the immunosuppressive effects of treatments I would recommend EVUSHELD prophylaxis.  IM injection x2 [same time]-cutdown risk of Covid infection/next 6 months. Patient is vaccinated to [place].   Ms.Causey, [place]-who will speak to the patient.  #Financial expense: Refer to Education officer, museum.   # IV access: Recommend port placement given the long infusions.  # DISPOSITION: # Financial referral # port referral' # Follow up on 20th-Wed [[place]]; MD; labs- cbc/cmp;uric acid/LDH; Dyann Kief D-1 & D-2  # Follow up -on 27th [[place]] MD; labs- cbc/cmp; uric acid;mag' phos LDH- Gazyva- Dr.B  Addendum: Discussed with Dr. Asquith-[place]cologist, [place]-reviewed the pathology of the lymph node biopsy in 2021-positive for SLL; negative for cyclin D1/1114 translocation.  Given the fax number/we will review pathology report when available.  Discussed with the patient regarding my discussion with Dr.Asquith.

## 2021-05-22 ENCOUNTER — Telehealth: Payer: Self-pay | Admitting: Internal Medicine

## 2021-05-22 ENCOUNTER — Encounter: Payer: Self-pay | Admitting: Internal Medicine

## 2021-05-22 MED ORDER — LIDOCAINE-PRILOCAINE 2.5-2.5 % EX CREA: TOPICAL_CREAM | CUTANEOUS | 0 refills | Status: DC

## 2021-05-22 MED ORDER — MONTELUKAST SODIUM 10 MG PO TABS: 10.0000 mg | ORAL_TABLET | Freq: Every day | ORAL | 0 refills | Status: DC

## 2021-05-22 MED ORDER — ALLOPURINOL 300 MG PO TABS: 300.0000 mg | ORAL_TABLET | Freq: Two times a day (BID) | ORAL | 0 refills | Status: DC

## 2021-05-22 MED ORDER — ACYCLOVIR 400 MG PO TABS: 400.0000 mg | ORAL_TABLET | Freq: Two times a day (BID) | ORAL | 4 refills | Status: DC

## 2021-05-22 NOTE — Telephone Encounter (Signed)
FYI- ------------------------------------------------------------------------- Hi Mr.Cozzolino:   I recommend he start taking the medications as recommended below:  #Start taking Tylenol 650 mg [2 pills]twice a day-starting 2 days before infusion; do not take on the day infusion  #Start taking Singulair once a day-starting 2 days before infusion; do not take on the day of infusion  #Take Benadryl 50 mg the night before infusion. Do not take on the day of infusion  # Start taking allopurinol 1 week prior to starting the chemo  # start acyclovir 1 week prior to starting the chemo  Please call us back if you have any questions or concerns.  Thanks GB  ----------------------------------------------------------------------------

## 2021-05-22 NOTE — Progress Notes (Signed)
START ON PATHWAY REGIMEN - Lymphoma and CLL     Cycle 1: A cycle is 28 days:     Venetoclax      Obinutuzumab      Obinutuzumab      Obinutuzumab    Cycle 2: A cycle is 28 days:     Venetoclax      Venetoclax      Venetoclax      Venetoclax      Obinutuzumab    Cycles 3 through 6: A cycle is 28 days:     Venetoclax      Obinutuzumab    Cycles 7 through 12: A cycle is 28 days:     Venetoclax   **Always confirm dose/schedule in your pharmacy ordering system**  Patient Characteristics: Chronic Lymphocytic Leukemia (CLL), Treatment Indicated, First Line, 17p del(-) and No ATM Deletion and TP53 Mutation Negative, Age ? 39 or Frail (Any Age) Disease Type: Chronic Lymphocytic Leukemia (CLL) Disease Type: Not Applicable Disease Type: Not Applicable Treatment Indicated<= Treatment Indicated Line of Therapy: First Line ATM (11q22) Deletion Status: No Deletion 17p Deletion Status: Negative TP53 Mutation Status: Negative Patient Age: ? 50 Patient Condition: Fit Patient Intent of Therapy: Non-Curative / Palliative Intent, Discussed with Patient

## 2021-05-22 NOTE — Patient Instructions (Signed)
#  Start taking Tylenol twice a day-starting 2 days before infusion; do not take on the day infusion  #Start taking Singulair once a day-starting 2 days before infusion; do not take on the day of infusion  #Take Benadryl 50 mg the night before infusion. Do not take on the day of infusion

## 2021-05-23 ENCOUNTER — Other Ambulatory Visit (HOSPITAL_COMMUNITY): Payer: Self-pay

## 2021-05-23 ENCOUNTER — Encounter: Payer: Self-pay | Admitting: Internal Medicine

## 2021-05-23 ENCOUNTER — Telehealth: Payer: Self-pay | Admitting: Pharmacy Technician

## 2021-05-23 NOTE — Telephone Encounter (Signed)
Oral Oncology Patient Advocate Encounter   Received notification from [facility] that prior authorization for Venclexta is required.   PA submitted on CoverMyMeds Key [id number] Status is pending   Oral Oncology Clinic will continue to follow.  [name] Phone [phone] Fax [phone] 05/24/2021 11:40 AM

## 2021-05-24 ENCOUNTER — Other Ambulatory Visit (HOSPITAL_COMMUNITY): Payer: Self-pay

## 2021-05-24 ENCOUNTER — Encounter: Payer: Self-pay | Admitting: Internal Medicine

## 2021-05-24 NOTE — Telephone Encounter (Addendum)
Oral Oncology Patient Advocate Encounter  Prior Authorization for Lynita Lombard has been approved.    PA# [id number] Effective dates: 05/24/21 through 11/20/21  Patients co-pay is $100.00. 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  Oral Oncology Clinic will continue to follow.   [place] Patient [place] Phone [phone] Fax [phone] 05/24/2021 2:01 PM

## 2021-05-26 ENCOUNTER — Telehealth: Payer: Self-pay | Admitting: Pharmacist

## 2021-05-26 NOTE — Telephone Encounter (Signed)
Oral Oncology Pharmacist Encounter  Received new prescription for Venclexta (venetoclax) for the treatment of CLL/SLL in conjunction with obinutuzumab, planned duration of 12 months. He will start his obinutuzamab on 7/20, venetoclax will start on C1D22 or with C2.  CBC/BMP from 05/05/21 assessed, no relevant lab abnormalities. Labs will be rechecked close to venetoclax initiation. Prescription dose and frequency assessed.   Current medication list in Epic reviewed, no DDIs with venetoclax identified.  Evaluated chart and no patient barriers to medication adherence identified.   Prescription will be sent to [facility] closer to the start time.   Oral Oncology Clinic will continue to follow for initial counseling and medication shipment.   Darl Pikes, PharmD, BCPS, BCOP, CPP Hematology/Oncology Clinical Pharmacist Practitioner [facility] [phone]  05/26/2021 4:04 PM

## 2021-05-27 ENCOUNTER — Other Ambulatory Visit (HOSPITAL_COMMUNITY): Payer: Self-pay

## 2021-05-31 NOTE — Telephone Encounter (Signed)
Oral Chemotherapy Pharmacist Encounter   Dr. Rogue Bussing would like to wait to start the venetoclax until cycle 3. He will inform the oral chemo team when it is closer to time for venetoclax initiation.   Darl Pikes, PharmD, BCPS, BCOP, CPP Hematology/Oncology Clinical Pharmacist [facility] [place] [facility] [phone]  05/31/2021 2:17 PM

## 2021-06-01 ENCOUNTER — Encounter: Payer: Self-pay | Admitting: Internal Medicine

## 2021-06-01 ENCOUNTER — Telehealth (INDEPENDENT_AMBULATORY_CARE_PROVIDER_SITE_OTHER): Payer: Self-pay

## 2021-06-01 NOTE — Telephone Encounter (Signed)
Spoke with the patient and he is scheduled for a port placement with Dr. Lucky Cowboy on 06/06/21 with a 11:15 am arrival time to the [facility]. Pre-procedure instructions were discussed and will be mailed.

## 2021-06-06 ENCOUNTER — Encounter
Admission: RE | Disposition: A | Discharge status: Home / Self Care | Payer: Self-pay | Source: Home / Self Care | Attending: Vascular Surgery

## 2021-06-06 ENCOUNTER — Ambulatory Visit
Admission: RE | Admit: 2021-06-06 | Discharge: 2021-06-06 | Disposition: A | Discharge status: Home / Self Care | Payer: Medicare PPO | Attending: Vascular Surgery | Admitting: Vascular Surgery

## 2021-06-06 ENCOUNTER — Other Ambulatory Visit (INDEPENDENT_AMBULATORY_CARE_PROVIDER_SITE_OTHER): Payer: Self-pay | Admitting: Nurse Practitioner

## 2021-06-06 ENCOUNTER — Encounter: Payer: Self-pay | Admitting: Vascular Surgery

## 2021-06-06 ENCOUNTER — Other Ambulatory Visit: Payer: Self-pay

## 2021-06-06 DIAGNOSIS — Z9102 Food additives allergy status: Secondary | ICD-10-CM | POA: Insufficient documentation

## 2021-06-06 DIAGNOSIS — F17210 Nicotine dependence, cigarettes, uncomplicated: Secondary | ICD-10-CM | POA: Insufficient documentation

## 2021-06-06 DIAGNOSIS — C9110 Chronic lymphocytic leukemia of B-cell type not having achieved remission: Secondary | ICD-10-CM | POA: Diagnosis not present

## 2021-06-06 DIAGNOSIS — Z79899 Other long term (current) drug therapy: Secondary | ICD-10-CM | POA: Diagnosis not present

## 2021-06-06 HISTORY — PX: PORTA CATH INSERTION: CATH118285

## 2021-06-06 SURGERY — PORTA CATH INSERTION
Anesthesia: Moderate Sedation

## 2021-06-06 MED ORDER — MIDAZOLAM HCL 2 MG/ML PO SYRP: 8.0000 mg | ORAL_SOLUTION | Freq: Once | ORAL | Status: DC | PRN

## 2021-06-06 MED ORDER — METHYLPREDNISOLONE SODIUM SUCC 125 MG IJ SOLR: 125.0000 mg | Freq: Once | INTRAMUSCULAR | Status: DC | PRN

## 2021-06-06 MED ORDER — FENTANYL CITRATE (PF) 100 MCG/2ML IJ SOLN
INTRAMUSCULAR | Status: AC
  Filled 2021-06-06: qty 2

## 2021-06-06 MED ORDER — FENTANYL CITRATE (PF) 100 MCG/2ML IJ SOLN
INTRAMUSCULAR | Status: DC | PRN
  Administered 2021-06-06: 50 ug via INTRAVENOUS

## 2021-06-06 MED ORDER — ONDANSETRON HCL 4 MG/2ML IJ SOLN: 4.0000 mg | Freq: Four times a day (QID) | INTRAMUSCULAR | Status: DC | PRN

## 2021-06-06 MED ORDER — MIDAZOLAM HCL 2 MG/2ML IJ SOLN
INTRAMUSCULAR | Status: DC | PRN
  Administered 2021-06-06: 1 mg via INTRAVENOUS

## 2021-06-06 MED ORDER — HYDROMORPHONE HCL 1 MG/ML IJ SOLN: 1.0000 mg | Freq: Once | INTRAMUSCULAR | Status: DC | PRN

## 2021-06-06 MED ORDER — CEFAZOLIN SODIUM-DEXTROSE 2-4 GM/100ML-% IV SOLN
2.0000 g | Freq: Once | INTRAVENOUS | Status: AC
  Administered 2021-06-06: 2 g via INTRAVENOUS

## 2021-06-06 MED ORDER — CHLORHEXIDINE GLUCONATE CLOTH 2 % EX PADS: 6.0000 | MEDICATED_PAD | Freq: Every day | CUTANEOUS | Status: DC

## 2021-06-06 MED ORDER — MIDAZOLAM HCL 5 MG/5ML IJ SOLN
INTRAMUSCULAR | Status: AC
  Filled 2021-06-06: qty 5

## 2021-06-06 MED ORDER — SODIUM CHLORIDE 0.9 % IV SOLN
Freq: Once | INTRAVENOUS | Status: DC
  Filled 2021-06-06: qty 2

## 2021-06-06 MED ORDER — SODIUM CHLORIDE 0.9 % IV SOLN: INTRAVENOUS | Status: DC

## 2021-06-06 MED ORDER — DIPHENHYDRAMINE HCL 50 MG/ML IJ SOLN: 50.0000 mg | Freq: Once | INTRAMUSCULAR | Status: DC | PRN

## 2021-06-06 MED ORDER — FAMOTIDINE 20 MG PO TABS: 40.0000 mg | ORAL_TABLET | Freq: Once | ORAL | Status: DC | PRN

## 2021-06-06 SURGICAL SUPPLY — 11 items
COVER PROBE U/S 5X48 (MISCELLANEOUS) ×2 IMPLANT
COVER SURGICAL LIGHT HANDLE (MISCELLANEOUS) ×2 IMPLANT
DERMABOND ADVANCED (GAUZE/BANDAGES/DRESSINGS) ×1
DERMABOND ADVANCED .7 DNX12 (GAUZE/BANDAGES/DRESSINGS) ×1 IMPLANT
KIT PORT POWER 8FR ISP CVUE (Port) ×2 IMPLANT
PACK ANGIOGRAPHY (CUSTOM PROCEDURE TRAY) ×2 IMPLANT
PENCIL ELECTRO HAND CTR (MISCELLANEOUS) ×2 IMPLANT
SPONGE XRAY 4X4 16PLY STRL (MISCELLANEOUS) ×2 IMPLANT
SUT MNCRL AB 4-0 PS2 18 (SUTURE) ×2 IMPLANT
SUT VIC AB 3-0 SH 27 (SUTURE) ×1
SUT VIC AB 3-0 SH 27X BRD (SUTURE) ×1 IMPLANT

## 2021-06-06 NOTE — Interval H&P Note (Signed)
History and Physical Interval Note:  06/06/2021 11:15 AM  Andres Mullins  has presented today for surgery, with the diagnosis of Porta Cath Insertion  Chronic lymphocytic leukemia.  The various methods of treatment have been discussed with the patient and family. After consideration of risks, benefits and other options for treatment, the patient has consented to  Procedure(s): PORTA CATH INSERTION (N/A) as a surgical intervention.  The patient's history has been reviewed, patient examined, no change in status, stable for surgery.  I have reviewed the patient's chart and labs.  Questions were answered to the patient's satisfaction.     Leotis Pain

## 2021-06-06 NOTE — Op Note (Signed)
      [facility]       Operative Note  Date: 06/06/2021  Preoperative diagnosis:  1. CLL  Postoperative diagnosis:  Same as above  Procedures: #1. Ultrasound guidance for vascular access to the right internal jugular vein. #2. Fluoroscopic guidance for placement of catheter. #3. Placement of CT compatible Port-A-Cath, right internal jugular vein.  Surgeon: Leotis Pain, MD.   Anesthesia: Local with moderate conscious sedation for approximately 23  minutes using 1 mg of Versed and 50 mcg of Fentanyl  Fluoroscopy time: less than 1 minute  Contrast used: 0  Estimated blood loss: 3 cc  Indication for the procedure:  The patient is a 70 y.o.male with CLL.  The patient needs a Port-A-Cath for durable venous access, chemotherapy, lab draws, and CT scans. We are asked to place this. Risks and benefits were discussed and informed consent was obtained.  Description of procedure: The patient was brought to the vascular and interventional radiology suite.  Moderate conscious sedation was administered throughout the procedure during a face to face encounter with the patient with my supervision of the RN administering medicines and monitoring the patient's vital signs, pulse oximetry, telemetry and mental status throughout from the start of the procedure until the patient was taken to the recovery room. The right neck chest and shoulder were sterilely prepped and draped, and a sterile surgical field was created. Ultrasound was used to help visualize a patent right internal jugular vein. This was then accessed under direct ultrasound guidance without difficulty with the Seldinger needle and a permanent image was recorded. A J-wire was placed. After skin nick and dilatation, the peel-away sheath was then placed over the wire. I then anesthetized an area under the clavicle approximately 1-2 fingerbreadths. A transverse incision was created and an inferior pocket was created with  electrocautery and blunt dissection. The port was then brought onto the field, placed into the pocket and secured to the chest wall with 2 Prolene sutures. The catheter was connected to the port and tunneled from the subclavicular incision to the access site. Fluoroscopic guidance was then used to cut the catheter to an appropriate length. The catheter was then placed through the peel-away sheath and the peel-away sheath was removed. The catheter tip was parked in excellent location under fluorocoscopic guidance in the cavoatrial junction. The pocket was then irrigated with antibiotic impregnated saline and the wound was closed with a running 3-0 Vicryl and a 4-0 Monocryl. The access incision was closed with a single 4-0 Monocryl. The Huber needle was used to withdraw blood and flush the port with heparinized saline. Dermabond was then placed as a dressing. The patient tolerated the procedure well and was taken to the recovery room in stable condition.   Leotis Pain 06/06/2021 1:16 PM   This note was created with Dragon Medical transcription system. Any errors in dictation are purely unintentional.

## 2021-06-08 NOTE — Progress Notes (Signed)
Pharmacist Chemotherapy Monitoring - Initial Assessment    Anticipated start date: 06/15/21   The following has been reviewed per standard work regarding the patient's treatment regimen: The patient's diagnosis, treatment plan and drug doses, and organ/hematologic function Lab orders and baseline tests specific to treatment regimen  The treatment plan start date, drug sequencing, and pre-medications Prior authorization status  Patient's documented medication list, including drug-drug interaction screen and prescriptions for anti-emetics and supportive care specific to the treatment regimen The drug concentrations, fluid compatibility, administration routes, and timing of the medications to be used The patient's access for treatment and lifetime cumulative dose history, if applicable  The patient's medication allergies and previous infusion related reactions, if applicable   Changes made to treatment plan:  treatment plan date  Follow up needed:  Pending authorization for treatment    Adelina Mings, [place], 06/08/2021  11:56 AM

## 2021-06-15 ENCOUNTER — Inpatient Hospital Stay: Payer: Medicare PPO | Attending: Internal Medicine | Admitting: Internal Medicine

## 2021-06-15 ENCOUNTER — Inpatient Hospital Stay: Payer: Medicare PPO

## 2021-06-15 ENCOUNTER — Other Ambulatory Visit: Payer: Self-pay

## 2021-06-15 VITALS — BP 122/72 | HR 90 | Temp 97.8°F | Resp 18 | Wt 165.1 lb

## 2021-06-15 VITALS — BP 129/82 | HR 82 | Temp 97.4°F | Resp 18

## 2021-06-15 DIAGNOSIS — Z818 Family history of other mental and behavioral disorders: Secondary | ICD-10-CM | POA: Insufficient documentation

## 2021-06-15 DIAGNOSIS — Z8269 Family history of other diseases of the musculoskeletal system and connective tissue: Secondary | ICD-10-CM | POA: Insufficient documentation

## 2021-06-15 DIAGNOSIS — Z9049 Acquired absence of other specified parts of digestive tract: Secondary | ICD-10-CM | POA: Insufficient documentation

## 2021-06-15 DIAGNOSIS — R590 Localized enlarged lymph nodes: Secondary | ICD-10-CM | POA: Diagnosis not present

## 2021-06-15 DIAGNOSIS — D72820 Lymphocytosis (symptomatic): Secondary | ICD-10-CM

## 2021-06-15 DIAGNOSIS — Z8249 Family history of ischemic heart disease and other diseases of the circulatory system: Secondary | ICD-10-CM | POA: Insufficient documentation

## 2021-06-15 DIAGNOSIS — C9110 Chronic lymphocytic leukemia of B-cell type not having achieved remission: Secondary | ICD-10-CM | POA: Diagnosis not present

## 2021-06-15 DIAGNOSIS — Z79899 Other long term (current) drug therapy: Secondary | ICD-10-CM | POA: Diagnosis not present

## 2021-06-15 DIAGNOSIS — F17210 Nicotine dependence, cigarettes, uncomplicated: Secondary | ICD-10-CM | POA: Insufficient documentation

## 2021-06-15 DIAGNOSIS — R5383 Other fatigue: Secondary | ICD-10-CM | POA: Insufficient documentation

## 2021-06-15 DIAGNOSIS — Z5112 Encounter for antineoplastic immunotherapy: Secondary | ICD-10-CM | POA: Diagnosis not present

## 2021-06-15 LAB — COMPREHENSIVE METABOLIC PANEL
ALT: 11 U/L (ref 0–44)
AST: 22 U/L (ref 15–41)
Albumin: 3.8 g/dL (ref 3.5–5.0)
Alkaline Phosphatase: 80 U/L (ref 38–126)
Anion gap: 10 (ref 5–15)
BUN: 14 mg/dL (ref 8–23)
CO2: 23 mmol/L (ref 22–32)
Calcium: 9.2 mg/dL (ref 8.9–10.3)
Chloride: 103 mmol/L (ref 98–111)
Creatinine, Ser: 0.93 mg/dL (ref 0.61–1.24)
GFR, Estimated: >60 mL/min (ref >60)
Glucose, Bld: 153 mg/dL — ABNORMAL HIGH (ref 70–99)
Potassium: 4 mmol/L (ref 3.5–5.1)
Sodium: 136 mmol/L (ref 135–145)
Total Bilirubin: 0.3 mg/dL (ref 0.3–1.2)
Total Protein: 6.3 g/dL — ABNORMAL LOW (ref 6.5–8.1)

## 2021-06-15 LAB — URIC ACID: Uric Acid, Serum: 2.4 mg/dL — ABNORMAL LOW (ref 3.7–8.6)

## 2021-06-15 LAB — CBC WITH DIFFERENTIAL/PLATELET
Abs Immature Granulocytes: 0.03 10*3/uL (ref 0.00–0.07)
Basophils Absolute: 0.1 10*3/uL (ref 0.0–0.1)
Basophils Relative: 1 %
Eosinophils Absolute: 0.2 10*3/uL (ref 0.0–0.5)
Eosinophils Relative: 2 %
HCT: 41.7 % (ref 39.0–52.0)
Hemoglobin: 14.4 g/dL (ref 13.0–17.0)
Immature Granulocytes: 0 %
Lymphocytes Relative: 50 %
Lymphs Abs: 5.2 10*3/uL — ABNORMAL HIGH (ref 0.7–4.0)
MCH: 31.6 pg (ref 26.0–34.0)
MCHC: 34.5 g/dL (ref 30.0–36.0)
MCV: 91.4 fL (ref 80.0–100.0)
Monocytes Absolute: 1.5 10*3/uL — ABNORMAL HIGH (ref 0.1–1.0)
Monocytes Relative: 14 %
Neutro Abs: 3.4 10*3/uL (ref 1.7–7.7)
Neutrophils Relative %: 33 %
Platelets: 181 10*3/uL (ref 150–400)
RBC: 4.56 MIL/uL (ref 4.22–5.81)
RDW: 14 % (ref 11.5–15.5)
Smear Review: NORMAL
WBC Morphology: ABNORMAL
WBC: 10.3 10*3/uL (ref 4.0–10.5)
nRBC: 0 % (ref 0.0–0.2)

## 2021-06-15 LAB — LACTATE DEHYDROGENASE: LDH: 257 U/L — ABNORMAL HIGH (ref 98–192)

## 2021-06-15 LAB — MAGNESIUM: Magnesium: 1.9 mg/dL (ref 1.7–2.4)

## 2021-06-15 MED ORDER — SODIUM CHLORIDE 0.9 % IV SOLN
20.0000 mg | Freq: Once | INTRAVENOUS | Status: AC
  Administered 2021-06-15: 20 mg via INTRAVENOUS
  Filled 2021-06-15: qty 20

## 2021-06-15 MED ORDER — ACETAMINOPHEN 325 MG PO TABS
650.0000 mg | ORAL_TABLET | Freq: Once | ORAL | Status: AC
  Administered 2021-06-15: 650 mg via ORAL
  Filled 2021-06-15: qty 2

## 2021-06-15 MED ORDER — SODIUM CHLORIDE 0.9% FLUSH
10.0000 mL | Freq: Once | INTRAVENOUS | Status: AC
  Administered 2021-06-15: 10 mL via INTRAVENOUS
  Filled 2021-06-15: qty 10

## 2021-06-15 MED ORDER — SODIUM CHLORIDE 0.9 % IV SOLN
Freq: Once | INTRAVENOUS | Status: AC
Start: 2021-06-15 — End: 2021-06-15
  Filled 2021-06-15: qty 250

## 2021-06-15 MED ORDER — HEPARIN SOD (PORK) LOCK FLUSH 100 UNIT/ML IV SOLN
INTRAVENOUS | Status: AC
  Filled 2021-06-15: qty 5

## 2021-06-15 MED ORDER — HEPARIN SOD (PORK) LOCK FLUSH 100 UNIT/ML IV SOLN
500.0000 [IU] | Freq: Once | INTRAVENOUS | Status: DC
  Filled 2021-06-15: qty 5

## 2021-06-15 MED ORDER — METHYLPREDNISOLONE SODIUM SUCC 125 MG IJ SOLR
125.0000 mg | Freq: Once | INTRAMUSCULAR | Status: AC | PRN
  Administered 2021-06-15: 125 mg via INTRAVENOUS

## 2021-06-15 MED ORDER — MEPERIDINE HCL 25 MG/ML IJ SOLN
25.0000 mg | INTRAMUSCULAR | Status: AC
  Administered 2021-06-15: 25 mg via INTRAVENOUS
  Filled 2021-06-15: qty 1

## 2021-06-15 MED ORDER — DIPHENHYDRAMINE HCL 50 MG/ML IJ SOLN
50.0000 mg | Freq: Once | INTRAMUSCULAR | Status: AC
  Administered 2021-06-15: 50 mg via INTRAVENOUS
  Filled 2021-06-15: qty 1

## 2021-06-15 MED ORDER — SODIUM CHLORIDE 0.9 % IV SOLN
100.0000 mg | Freq: Once | INTRAVENOUS | Status: AC
  Administered 2021-06-15: 100 mg via INTRAVENOUS
  Filled 2021-06-15: qty 4

## 2021-06-15 MED ORDER — MONTELUKAST SODIUM 10 MG PO TABS
10.0000 mg | ORAL_TABLET | Freq: Every day | ORAL | Status: DC
  Administered 2021-06-15: 10 mg via ORAL
  Filled 2021-06-15: qty 1

## 2021-06-15 MED ORDER — HEPARIN SOD (PORK) LOCK FLUSH 100 UNIT/ML IV SOLN
500.0000 [IU] | Freq: Once | INTRAVENOUS | Status: AC | PRN
  Administered 2021-06-15: 500 [IU]
  Filled 2021-06-15: qty 5

## 2021-06-15 MED ORDER — SODIUM CHLORIDE 0.9 % IV SOLN
Freq: Once | INTRAVENOUS | Status: DC | PRN
  Filled 2021-06-15: qty 250

## 2021-06-15 NOTE — Patient Instructions (Addendum)
[facility]  Discharge Instructions: Thank you for choosing [facility] to provide your oncology and hematology care.  If you have a lab appointment with the [facility], please go directly to the [facility] and check in at the registration area.  Wear comfortable clothing and clothing appropriate for easy access to any Portacath or PICC line.   We strive to give you quality time with your provider. You may need to reschedule your appointment if you arrive late (15 or more minutes).  Arriving late affects you and other patients whose appointments are after yours.  Also, if you miss three or more appointments without notifying the office, you may be dismissed from the clinic at the provider's discretion.      For prescription refill requests, have your pharmacy contact our office and allow 72 hours for refills to be completed.    Today you received the following chemotherapy and/or immunotherapy agents Gazyva       To help prevent nausea and vomiting after your treatment, we encourage you to take your nausea medication as directed.  BELOW ARE SYMPTOMS THAT SHOULD BE REPORTED IMMEDIATELY: *FEVER GREATER THAN 100.4 F (38 C) OR HIGHER *CHILLS OR SWEATING *NAUSEA AND VOMITING THAT IS NOT CONTROLLED WITH YOUR NAUSEA MEDICATION *UNUSUAL SHORTNESS OF BREATH *UNUSUAL BRUISING OR BLEEDING *URINARY PROBLEMS (pain or burning when urinating, or frequent urination) *BOWEL PROBLEMS (unusual diarrhea, constipation, pain near the anus) TENDERNESS IN MOUTH AND THROAT WITH OR WITHOUT PRESENCE OF ULCERS (sore throat, sores in mouth, or a toothache) UNUSUAL RASH, SWELLING OR PAIN  UNUSUAL VAGINAL DISCHARGE OR ITCHING   Items with * indicate a potential emergency and should be followed up as soon as possible or go to the Emergency Department if any problems should occur.  Please show the CHEMOTHERAPY ALERT CARD or IMMUNOTHERAPY ALERT CARD at check-in to  the Emergency Department and triage nurse.  Should you have questions after your visit or need to cancel or reschedule your appointment, please contact [facility]  [phone] and follow the prompts.  Office hours are 8:00 a.m. to 4:30 p.m. Monday - Friday. Please note that voicemails left after 4:00 p.m. may not be returned until the following business day.  We are closed weekends and major holidays. You have access to a nurse at all times for urgent questions. Please call the main number to the clinic [phone] and follow the prompts.  For any non-urgent questions, you may also contact your provider using MyChart. We now offer e-Visits for anyone 46 and older to request care online for non-urgent symptoms. For details visit [email or web address].   Also download the MyChart app! Go to the app store, search "MyChart", open the app, select [facility], and log in with your MyChart username and password.  Due to Covid, a mask is required upon entering the hospital/clinic. If you do not have a mask, one will be given to you upon arrival. For doctor visits, patients may have 1 support person aged 46 or older with them. For treatment visits, patients cannot have anyone with them due to current Covid guidelines and our immunocompromised population.     Obinutuzumab injection What is this medication? OBINUTUZUMAB (OH bi nue TOOZ ue mab) is a monoclonal antibody. It is used to treat chronic lymphocytic leukemia (CLL) and a type of non-Hodgkin lymphoma(NHL), follicular lymphoma. This medicine may be used for other purposes; ask your health care provider orpharmacist if you have questions.  COMMON BRAND NAME(S): GAZYVA What should I tell my care team before I take this medication? They need to know if you have any of these conditions: infection (especially a virus infection such as hepatitis B virus) lung or breathing disease heart disease take medicines that  treat or prevent blood clots an unusual or allergic reaction to obinutuzumab, other medicines, foods, dyes, or preservatives pregnant or trying to get pregnant breast-feeding How should I use this medication? This medicine is for infusion into a vein. It is given by a health careprofessional in a hospital or clinic setting. Talk to your pediatrician regarding the use of this medicine in children.Special care may be needed. Overdosage: If you think you have taken too much of this medicine contact apoison control center or emergency room at once. NOTE: This medicine is only for you. Do not share this medicine with others. What if I miss a dose? Keep appointments for follow-up doses as directed. It is important not to miss your dose. Call your doctor or health care professional if you are unable tokeep an appointment. What may interact with this medication? live virus vaccines This list may not describe all possible interactions. Give your health care provider a list of all the medicines, herbs, non-prescription drugs, or dietary supplements you use. Also tell them if you smoke, drink alcohol, or use illegaldrugs. Some items may interact with your medicine. What should I watch for while using this medication? Report any side effects that you notice during your treatment right away, such as changes in your breathing, fever, chills, dizziness or lightheadedness.These effects are more common with the first dose. Visit your prescriber or health care professional for checks on your progress. You will need to have regular blood work. Report any other side effects. The side effects of this medicine can continue after you finish your treatment. Continue your course of treatment even though you feel ill unless your doctortells you to stop. Call your doctor or health care professional for advice if you get a fever, chills or sore throat, or other symptoms of a cold or flu. Do not treat yourself. This drug  decreases your body's ability to fight infections. Try toavoid being around people who are sick. This medicine may increase your risk to bruise or bleed. Call your doctor orhealth care professional if you notice any unusual bleeding. Do not become pregnant while taking this medicine or for 6 months after stopping it. Women should inform their doctor if they wish to become pregnant or think they might be pregnant. There is a potential for serious side effects to an unborn child. Talk to your health care professional or pharmacist for more information. Do not breast-feed an infant while taking this medicine orfor 6 months after stopping it. What side effects may I notice from receiving this medication? Side effects that you should report to your doctor or health care professionalas soon as possible: allergic reactions like skin rash, itching or hives, swelling of the face, lips, or tongue breathing problems changes in vision chest pain or chest tightness confusion dizziness loss of balance or coordination low blood counts - this medicine may decrease the number of white blood cells, red blood cells and platelets. You may be at increased risk for infections and bleeding. signs of decreased platelets or bleeding - bruising, pinpoint red spots on the skin, black, tarry stools, blood in the urine signs of infection - fever or chills, cough, sore throat, pain or trouble passing urine signs and symptoms of  liver injury like dark yellow or brown urine; general ill feeling or flu-like symptoms; light-colored stools; loss of appetite; nausea; right upper belly pain; unusually weak or tired; yellowing of the eyes or skin trouble speaking or understanding trouble walking vomiting Side effects that usually do not require medical attention (report to yourdoctor or health care professional if they continue or are bothersome): constipation joint pain muscle pain This list may not describe all possible side  effects. Call your doctor for medical advice about side effects. You may report side effects to FDA at1-800-FDA-1088. Where should I keep my medication? This drug is only given in a hospital or clinic and will not be stored at home. NOTE: This sheet is a summary. It may not cover all possible information. If you have questions about this medicine, talk to your doctor, pharmacist, orhealth care provider.  2022 [employer]vier/Gold Standard (2019-02-25 15:34:53)

## 2021-06-15 NOTE — Progress Notes (Signed)
1235: Pt reports having chills, Rigors noted. Pt denies any other symptoms at this time. Gazyva stopped, NS started, warm blankets provided and Dr. Rogue Bussing aware.  1241: Dr. Rogue Bussing at chairside and orders given for 125 mg IV solumedrol and 25 mg IV Demerol.  **see MAR for Medications**  1248: Pt reports chills are improving, no rigors noted at this time.  1252: Pt reports all symptoms resolved.  1215: Pt and VS stable. Per Dr. Rogue Bussing okay to restart Gazyva at this time.  1605: Pt tolerated remainder of Gazyva well. Pt and VS stable at discharge.

## 2021-06-15 NOTE — Progress Notes (Signed)
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   AUG 2021-Head and neck: The brain demonstrates symmetric appearing diffuse physiologic uptake without focal finding to suggest abnormality. In the left posterior and medial maxillary cavity, there is an area of mucoid deposition seen within SUV max of 28.8. This was seen in the previous study of November. The specific bony erosion, clear-cut soft tissue lesion is not detected. This probably represents a chronic sinus disease focus. The bilateral optic nerves and extraocular muscles to the largest nodes are seen on axial image 98. 1 now demonstrates a short axis measurement of 3.3 cm and the other more medially, a short axis measurement of 2.5 cm. Demonstrate symmetric increased uptake. The left, much greater than right, jugulodigastric and supraclavicular nodes have increased significantly in size and avidity since the previous study. Both of these lymph nodes demonstrate an SUV max of 3.9. No largest lymph node in the right subclavicular space is seen on axial image 104, demonstrating an SUV max of 3.3, and a short axis measurement of 1.4 cm.   On axial image 102, a right paratracheal lymph node is again identified with increased avidity, located immediately posterior to the right thyroid pole. This area was noted previously, but now increased avidity is seen, with an SUV  max of 8.6 in this area. Multiple smaller, and less avid lymph nodes scattered throughout the left greater than right neck are seen, as well as supraclavicular and infraclavicular areas.   Thorax: Significant axillary lymph nodes have increased significantly in size. These are also left greater than right, with the largest on the left seen on image #114 with a short axis measurement of 2.2 cm and an SUV max of 4.3. The largest on the right as seen on image 111, demonstrating a short axis of 1.4 cm and an SUV max of 4.0. Too numerous to count smaller and similarly avid lymph nodes in both axillae are also noted. These are all larger than previously seen and in general, more avid.  Numerous small AP window and lesser mediastinal lymph nodes are seen with minimal increased avidity, appearing similar in size and avidity to the previous study. No other significant mediastinal adenopathy. No hilar adenopathy detected. These likely also represent lymphoproliferative disease.  Hyperinflation of COPD, bibasilar dependent atelectasis, some paucity of lung tissue and biapical scarring are noted without other focal pulmonary mass or nodule, or acute pulmonary parenchymal disease. Physiologic uptake in the left heart is seen, as expected. Small hiatal hernia is again identified.   Abdomen pelvis: Physiologic uptake in the liver, gut and urinary tract is again noted. No focal area of asymmetric uptake in the structures is seen. The spleen also demonstrates generalized increased uptake and stable splenomegaly, up to 14.2 cm. On image 198, a preaortic lymph node is detected of increased size since the previous study, now measuring up to 1.2 cm short axis.  Several large lymph nodes on image 203 of the mesentery are  noted. The largest of these, on image 201 and ureters up to 1.8 cm short axis. Despite the fact that measurable avidity is not detected in these are the surrounding lymph nodes, these are doubtless involved in the  lymphoproliferative disease described, and appear larger. Multiple smaller lymph nodes scattered throughout the mesentery are also seen.  Grossly enlarged right greater than left common iliac chain lymph nodes are again seen, throughout the pelvis and into the inguinal areas bilaterally. The most avid on the right is seen on image #260 to, demonstrates a short axis of 2.5 cm, and an SUV max of 4.9. The largest on the left is identified image #272, demonstrates a short axis of 2.0 cm, and an SUV max of 4.7. These are similar to the previously noted values.  Right greater than left inguinal adenopathy is seen, with the largest lymph node seen on the right image 276, demonstrating a short axis of 1.5 cm and an SUV max of 3.9. This is larger in size and avidity than previously seen. There are bilateral enlarged lymph nodes associated with the external iliac vessels on image 277. These measure 2.0 cm on the right in short axis, with an SUV max of 3.9.  On the left, image 278, the external iliac lymph node is larger at 1.8 cm short axis, but with an SUV max of only 3.8. Lesser avid and smaller lymph nodes on both sides are also detected.   Musculoskeletal: Scattered, insignificant uptake is detected in the muscles especially in the bilateral joints, without focal finding to suggest lymphoproliferative disease.   IMPRESSIONS:  Significant increase in numbers, short axis size, and to a large extent, avidity -in the lymph nodes of the left greater than right neck, supraclavicular and infraclavicular areas, bilateral axillary lymph nodes and common iliac chain, external iliac chain and inguinal lymph nodes, consistent with worsening lymphoproliferative disease. The mediastinal lymph nodes seen are still present and unchanged in size or [significantly] in avidity.    [place]- Bx- SLL/CLL.July 2022-Discussed with Dr. Asquith-oncologist, [place]-reviewed the pathology of the lymph node biopsy in 2021-positive for SLL;  negative for cyclin D1/1114 translocation.    # July 20th, 2022- GAZYVA #1 Aletha Halim plan ading VENATOCLAX after 2 cycles/de-bulking]  # Lung cancer screening- on [facility]  # SURVIVORSHIP:   # GENETICS:   DIAGNOSIS:   STAGE:         ;  GOALS:  CURRENT/MOST RECENT THERAPY :       CLL (chronic lymphocytic leukemia) ([place])  10/29/2019 Initial Diagnosis   CLL (chronic lymphocytic leukemia) ([place])   06/15/2021 -  Chemotherapy    Patient is on Treatment Plan: LYMPHOMA CLL/SLL VENETOCLAX + OBINUTUZUMAB Q28D          INTERVAL HISTORY:  A pleasant 70 year-old male patient with CLL/SLL is here for follow-up currently on surveillance is here for follow-up/review results of the PET scan  Patient continues to notice worsening neck adenopathy; also underarm lymph nodes.  No new shortness of breath or cough.  No drenching night sweats.  No fatigue.  Review of Systems  Constitutional:  Positive for malaise/fatigue. Negative for chills, diaphoresis, fever and weight loss.  HENT:  Negative for nosebleeds and sore throat.   Eyes:  Negative for double vision.  Respiratory:  Negative for cough, hemoptysis, sputum production, shortness of breath and wheezing.   Cardiovascular:  Negative for chest pain, palpitations, orthopnea and leg swelling.  Gastrointestinal:  Negative for abdominal pain, blood in stool, constipation, diarrhea, heartburn, melena,  nausea and vomiting.  Genitourinary:  Negative for dysuria, frequency and urgency.  Musculoskeletal:  Negative for back pain and joint pain.  Skin: Negative.  Negative for itching and rash.  Neurological:  Negative for dizziness, tingling, focal weakness, weakness and headaches.  Endo/Heme/Allergies:  Does not bruise/bleed easily.  Psychiatric/Behavioral:  Negative for depression. The patient is not nervous/anxious and does not have insomnia.     PAST MEDICAL HISTORY :  Past Medical History:  Diagnosis Date   Atherosclerosis of coronary artery  09/28/2016   Chest CT Nov 2017   Elevated C-reactive protein (CRP)    Elevated PSA    Fractured sternum 1999   Herpes    History of rib fracture 1999   multiple   Hyperlipidemia    Psoriasis    Rheumatoid factor positive April 2015   Rosacea    Thoracic aortic atherosclerosis ([place]) 09/28/2016   Chest CT Nov 2017   Tobacco abuse    Vitamin D deficiency disease     PAST SURGICAL HISTORY :   Past Surgical History:  Procedure Laterality Date   APPENDECTOMY  1974   PORTA CATH INSERTION N/A 06/06/2021   Procedure: PORTA CATH INSERTION;  Surgeon: Algernon Huxley, MD;  Location: [facility];  Service: Cardiovascular;  Laterality: N/A;   SHOULDER ARTHROSCOPY  10/23/2016   Procedure: ARTHROSCOPY SHOULDER WITH DISTAL CLAVICLE EXCISION, PARTIAL ACROMIONECTOMY, AND DEBRIDEMENT;  Surgeon: Earnestine Leys, MD;  Location: [facility];  Service: Orthopedics;;   TIBIA FRACTURE SURGERY Left 2004   titanium rod, ORIF    FAMILY HISTORY :   Family History  Problem Relation Age of Onset   Heart disease Father    Osteoarthritis Father    Alzheimer's disease Father    Cancer Neg Hx    Diabetes Neg Hx    Hypertension Neg Hx    Stroke Neg Hx    COPD Neg Hx     SOCIAL HISTORY:   Social History   Tobacco Use   Smoking status: Every Day    Packs/day: 0.50    Years: 45.00    Pack years: 22.50    Types: Cigarettes   Smokeless tobacco: Never   Tobacco comments:    11/6- currently smoking 5 cigarettes a day   Vaping Use   Vaping Use: Never used  Substance Use Topics   Alcohol use: Yes    Alcohol/week: 3.0 standard drinks    Types: 3 Glasses of wine per week    Comment: a couple glasses of wine a week   Drug use: No    ALLERGIES:  is allergic to gluten meal.  MEDICATIONS:  Current Outpatient Medications  Medication Sig Dispense Refill   acyclovir (ZOVIRAX) 400 MG tablet Take 1 tablet (400 mg total) by mouth 2 (two) times daily. 60 tablet 4   allopurinol (ZYLOPRIM) 300 MG tablet Take  1 tablet (300 mg total) by mouth 2 (two) times daily. 120 tablet 0   lidocaine-prilocaine (EMLA) cream Apply 30 -45 mins prior to port access. 30 g 0   montelukast (SINGULAIR) 10 MG tablet Take 1 tablet (10 mg total) by mouth at bedtime. Start 2 days prior to infusion. Do not take on the day of infusion. 60 tablet 0   No current facility-administered medications for this visit.    PHYSICAL EXAMINATION: ECOG PERFORMANCE STATUS: 0 - Asymptomatic  BP 122/72   Pulse 90   Temp 97.8 F (36.6 C) (Oral)   Resp 18   Wt 165 lb 1  oz (74.9 kg)   SpO2 98%   BMI 26.64 kg/m   Filed Weights   06/15/21 0842  Weight: 165 lb 1 oz (74.9 kg)    Physical Exam Constitutional:      Comments: Accompanied by his wife.  Ambulating independently.  Bulky adenopathy noted left supraclavicular region [8cm]. ~7 cm lymph node felt in the left underarm; 5cm lymph node in the right underarm.  ~3 cm  lymph node felt in the right and left groin.  HENT:     Head: Normocephalic and atraumatic.     Mouth/Throat:     Pharynx: No oropharyngeal exudate.  Eyes:     Pupils: Pupils are equal, round, and reactive to light.  Cardiovascular:     Rate and Rhythm: Normal rate and regular rhythm.  Pulmonary:     Effort: No respiratory distress.     Breath sounds: No wheezing.  Abdominal:     General: Bowel sounds are normal. There is no distension.     Palpations: Abdomen is soft. There is no mass.     Tenderness: There is no abdominal tenderness. There is no guarding or rebound.  Musculoskeletal:        General: No tenderness. Normal range of motion.     Cervical back: Normal range of motion and neck supple.  Skin:    General: Skin is warm.  Neurological:     Mental Status: He is alert and oriented to person, place, and time.  Psychiatric:        Mood and Affect: Affect normal.    LABORATORY DATA:  I have reviewed the data as listed    Component Value Date/Time   NA 136 06/15/2021 0823   K 4.0 06/15/2021  0823   CL 103 06/15/2021 0823   CO2 23 06/15/2021 0823   GLUCOSE 153 (H) 06/15/2021 0823   BUN 14 06/15/2021 0823   CREATININE 0.93 06/15/2021 0823   CREATININE 0.84 10/06/2019 0000   CALCIUM 9.2 06/15/2021 0823   PROT 6.3 (L) 06/15/2021 0823   ALBUMIN 3.8 06/15/2021 0823   AST 22 06/15/2021 0823   ALT 11 06/15/2021 0823   ALKPHOS 80 06/15/2021 0823   BILITOT 0.3 06/15/2021 0823   GFRNONAA >60 06/15/2021 0823   GFRNONAA 91 10/06/2019 0000   GFRAA >60 12/26/2019 0842   GFRAA 106 10/06/2019 0000    No results found for: SPEP, UPEP  Lab Results  Component Value Date   WBC 10.3 06/15/2021   NEUTROABS 3.4 06/15/2021   HGB 14.4 06/15/2021   HCT 41.7 06/15/2021   MCV 91.4 06/15/2021   PLT 181 06/15/2021      Chemistry      Component Value Date/Time   NA 136 06/15/2021 0823   K 4.0 06/15/2021 0823   CL 103 06/15/2021 0823   CO2 23 06/15/2021 0823   BUN 14 06/15/2021 0823   CREATININE 0.93 06/15/2021 0823   CREATININE 0.84 10/06/2019 0000      Component Value Date/Time   CALCIUM 9.2 06/15/2021 0823   ALKPHOS 80 06/15/2021 0823   AST 22 06/15/2021 0823   ALT 11 06/15/2021 0823   BILITOT 0.3 06/15/2021 0823       RADIOGRAPHIC STUDIES: I have personally reviewed the radiological images as listed and agreed with the findings in the report. No results found.   ASSESSMENT & PLAN:   .CLL (chronic lymphocytic leukemia) ([place]) #SLL/CLL- PET scan- June 2022- Marked progression of lymphoproliferative process -Axillary /neck retroperitoneal adenopathy. Proceed with Dyann Kief  6 cycles+ venetoclax 12 months therapy; again reviewed the goals of therapy-controlled disease/hopefully long-term remissions.  #Proceed with cycle number 1 day 1 of Gazyva today again reviewed the potential side effects/not limited to infusion reactions neutropenia diarrhea etc.  We will plan to start venetoclax with cycle #3 after debulking/repeat imaging.  Again reviewed the premedications.   #Recommend  shingles/TLS prophylaxis: Acyclovir/allopurinol.   # EVUSHLED: # COVID EVUSHELD PROPHYLAXIS: Given the immunosuppressive effects of treatments I would recommend EVUSHELD prophylaxis.  IM injection x2 [same time]-cutdown risk of Covid infection/next 6 months. Patient is vaccinated to COVID. s/p discussion with Ms.Causey, [place]; plan for injection today.  #Mediport placement: No malfunction noted  # DISPOSITION: #Plan Gazyva today; day 2 as planned. #Follow-up as planned in 1 week. # Follow up -on 27th [burling] MD; labs- cbc/cmp; uric acid;mag' phos LDH- Gazyva- Dr.B         Orders Placed This Encounter  Procedures   CBC with Differential    Standing Status:   Future    Standing Expiration Date:   06/15/2022   Comprehensive metabolic panel    Standing Status:   Future    Standing Expiration Date:   06/15/2022   Lactate dehydrogenase    Standing Status:   Future    Standing Expiration Date:   06/15/2022   Uric acid   Magnesium    Standing Status:   Future    Standing Expiration Date:   06/15/2022   Phosphorus    Standing Status:   Future    Standing Expiration Date:   06/15/2022         Cammie Sickle, MD 06/15/2021 9:29 PM

## 2021-06-15 NOTE — Patient Instructions (Addendum)
#  Take Tylenol 650 mg tonight  #Take Benadryl 50 mg tonight.  --------------------------------------------------------  #Start taking Tylenol 650 mg [2 pills]twice a day-starting 2 days before infusion; do not take on the day infusion   #Start taking Singulair once a day-starting 2 days before infusion; do not take on the day of infusion   #Take Benadryl 50 mg the night before infusion. Do not take on the day of infusion

## 2021-06-15 NOTE — Assessment & Plan Note (Addendum)
#  SLL/CLL- PET scan- June 2022- Marked progression of lymphoproliferative process -Axillary /neck retroperitoneal adenopathy. Proceed with Gazyva 6 cycles+ venetoclax 12 months therapy; again reviewed the goals of therapy-controlled disease/hopefully long-term remissions.  #Proceed with cycle number 1 day 1 of Gazyva today again reviewed the potential side effects/not limited to infusion reactions neutropenia diarrhea etc.  We will plan to start venetoclax with cycle #3 after debulking/repeat imaging.  Again reviewed the premedications.  #Recommend shingles/TLS prophylaxis: Acyclovir/allopurinol.   # EVUSHLED: # COVID EVUSHELD PROPHYLAXIS: Given the immunosuppressive effects of treatments I would recommend EVUSHELD prophylaxis.  IM injection x2 [same time]-cutdown risk of Covid infection/next 6 months. Patient is vaccinated to COVID. s/p discussion with Ms.Causey, [place]; plan for injection today.  #Mediport placement: No malfunction noted  # DISPOSITION: #Plan Gazyva today; day 2 as planned. #Follow-up as planned in 1 week. # Follow up -on 27th [burling] MD; labs- cbc/cmp; uric acid;mag' phos LDH- Gazyva- Dr.B  Addendum: About halfway into infusion patient had a grade 2-acute infusion reaction with rigors/chills; infusion stopped for about half an hour.  Received Solu-Medrol/Demerol.  Infusion restarted-no further dialysis needs noted.  Patient reminded to take Tylenol 650/Benadryl 50 mg tonight.  Patient return to clinic tomorrow-for cycle number 1 day 2 Gazyva.   # 40 minutes face-to-face with the patient discussing the above plan of care; more than 50% of time spent on prognosis/ natural history; counseling and coordination.

## 2021-06-16 ENCOUNTER — Inpatient Hospital Stay: Payer: Medicare PPO

## 2021-06-16 VITALS — BP 121/66 | HR 86 | Temp 96.0°F | Resp 19

## 2021-06-16 DIAGNOSIS — Z79899 Other long term (current) drug therapy: Secondary | ICD-10-CM | POA: Diagnosis not present

## 2021-06-16 DIAGNOSIS — C9110 Chronic lymphocytic leukemia of B-cell type not having achieved remission: Secondary | ICD-10-CM

## 2021-06-16 DIAGNOSIS — Z9049 Acquired absence of other specified parts of digestive tract: Secondary | ICD-10-CM | POA: Diagnosis not present

## 2021-06-16 DIAGNOSIS — Z8249 Family history of ischemic heart disease and other diseases of the circulatory system: Secondary | ICD-10-CM | POA: Diagnosis not present

## 2021-06-16 DIAGNOSIS — R5383 Other fatigue: Secondary | ICD-10-CM | POA: Diagnosis not present

## 2021-06-16 DIAGNOSIS — F17210 Nicotine dependence, cigarettes, uncomplicated: Secondary | ICD-10-CM | POA: Diagnosis not present

## 2021-06-16 DIAGNOSIS — Z8269 Family history of other diseases of the musculoskeletal system and connective tissue: Secondary | ICD-10-CM | POA: Diagnosis not present

## 2021-06-16 DIAGNOSIS — Z5112 Encounter for antineoplastic immunotherapy: Secondary | ICD-10-CM | POA: Diagnosis not present

## 2021-06-16 DIAGNOSIS — R590 Localized enlarged lymph nodes: Secondary | ICD-10-CM | POA: Diagnosis not present

## 2021-06-16 MED ORDER — SODIUM CHLORIDE 0.9 % IV SOLN
Freq: Once | INTRAVENOUS | Status: DC | PRN
  Filled 2021-06-16: qty 250

## 2021-06-16 MED ORDER — SODIUM CHLORIDE 0.9 % IV SOLN
900.0000 mg | Freq: Once | INTRAVENOUS | Status: AC
  Administered 2021-06-16: 900 mg via INTRAVENOUS
  Filled 2021-06-16: qty 36

## 2021-06-16 MED ORDER — SODIUM CHLORIDE 0.9 % IV SOLN
20.0000 mg | Freq: Once | INTRAVENOUS | Status: AC
  Administered 2021-06-16: 20 mg via INTRAVENOUS
  Filled 2021-06-16: qty 20

## 2021-06-16 MED ORDER — MONTELUKAST SODIUM 10 MG PO TABS
10.0000 mg | ORAL_TABLET | Freq: Every day | ORAL | Status: DC
  Administered 2021-06-16: 10 mg via ORAL
  Filled 2021-06-16: qty 1

## 2021-06-16 MED ORDER — HEPARIN SOD (PORK) LOCK FLUSH 100 UNIT/ML IV SOLN
INTRAVENOUS | Status: AC
  Filled 2021-06-16: qty 5

## 2021-06-16 MED ORDER — ACETAMINOPHEN 325 MG PO TABS
650.0000 mg | ORAL_TABLET | Freq: Once | ORAL | Status: AC
  Administered 2021-06-16: 650 mg via ORAL
  Filled 2021-06-16: qty 2

## 2021-06-16 MED ORDER — HEPARIN SOD (PORK) LOCK FLUSH 100 UNIT/ML IV SOLN
500.0000 [IU] | Freq: Once | INTRAVENOUS | Status: AC | PRN
  Administered 2021-06-16: 500 [IU]
  Filled 2021-06-16: qty 5

## 2021-06-16 MED ORDER — SODIUM CHLORIDE 0.9 % IV SOLN
Freq: Once | INTRAVENOUS | Status: AC
  Filled 2021-06-16: qty 250

## 2021-06-16 MED ORDER — FAMOTIDINE 20 MG IN NS 100 ML IVPB: 20.0000 mg | Freq: Once | INTRAVENOUS | Status: DC | PRN

## 2021-06-16 MED ORDER — METHYLPREDNISOLONE SODIUM SUCC 125 MG IJ SOLR
125.0000 mg | Freq: Once | INTRAMUSCULAR | Status: AC | PRN
  Administered 2021-06-16: 125 mg via INTRAVENOUS

## 2021-06-16 MED ORDER — FAMOTIDINE IN NACL 20-0.9 MG/50ML-% IV SOLN
20.0000 mg | Freq: Two times a day (BID) | INTRAVENOUS | Status: DC
  Administered 2021-06-16: 20 mg via INTRAVENOUS
  Filled 2021-06-16: qty 50

## 2021-06-16 MED ORDER — DIPHENHYDRAMINE HCL 50 MG/ML IJ SOLN
50.0000 mg | Freq: Once | INTRAMUSCULAR | Status: AC
  Administered 2021-06-16: 50 mg via INTRAVENOUS
  Filled 2021-06-16: qty 1

## 2021-06-16 MED ORDER — DIPHENHYDRAMINE HCL 50 MG/ML IJ SOLN
50.0000 mg | Freq: Once | INTRAMUSCULAR | Status: AC | PRN
  Administered 2021-06-16: 25 mg via INTRAVENOUS

## 2021-06-16 NOTE — Patient Instructions (Addendum)
[facility]  Discharge Instructions: Thank you for choosing [facility] to provide your oncology and hematology care.  If you have a lab appointment with the [facility], please go directly to the [facility] and check in at the registration area.  Wear comfortable clothing and clothing appropriate for easy access to any Portacath or PICC line.   We strive to give you quality time with your provider. You may need to reschedule your appointment if you arrive late (15 or more minutes).  Arriving late affects you and other patients whose appointments are after yours.  Also, if you miss three or more appointments without notifying the office, you may be dismissed from the clinic at the provider's discretion.      For prescription refill requests, have your pharmacy contact our office and allow 72 hours for refills to be completed.    Today you received  Gazyva   To help prevent nausea and vomiting after your treatment, we encourage you to take your nausea medication as directed.  BELOW ARE SYMPTOMS THAT SHOULD BE REPORTED IMMEDIATELY: *FEVER GREATER THAN 100.4 F (38 C) OR HIGHER *CHILLS OR SWEATING *NAUSEA AND VOMITING THAT IS NOT CONTROLLED WITH YOUR NAUSEA MEDICATION *UNUSUAL SHORTNESS OF BREATH *UNUSUAL BRUISING OR BLEEDING *URINARY PROBLEMS (pain or burning when urinating, or frequent urination) *BOWEL PROBLEMS (unusual diarrhea, constipation, pain near the anus) TENDERNESS IN MOUTH AND THROAT WITH OR WITHOUT PRESENCE OF ULCERS (sore throat, sores in mouth, or a toothache) UNUSUAL RASH, SWELLING OR PAIN  UNUSUAL VAGINAL DISCHARGE OR ITCHING   Items with * indicate a potential emergency and should be followed up as soon as possible or go to the Emergency Department if any problems should occur.  Please show the CHEMOTHERAPY ALERT CARD or IMMUNOTHERAPY ALERT CARD at check-in to the Emergency Department and triage nurse.  Should you  have questions after your visit or need to cancel or reschedule your appointment, please contact [facility]  [phone] and follow the prompts.  Office hours are 8:00 a.m. to 4:30 p.m. Monday - Friday. Please note that voicemails left after 4:00 p.m. may not be returned until the following business day.  We are closed weekends and major holidays. You have access to a nurse at all times for urgent questions. Please call the main number to the clinic [phone] and follow the prompts.  For any non-urgent questions, you may also contact your provider using MyChart. We now offer e-Visits for anyone 28 and older to request care online for non-urgent symptoms. For details visit [email or web address].   Also download the MyChart app! Go to the app store, search "MyChart", open the app, select [facility], and log in with your MyChart username and password.  Due to Covid, a mask is required upon entering the hospital/clinic. If you do not have a mask, one will be given to you upon arrival. For doctor visits, patients may have 1 support person aged 8 or older with them. For treatment visits, patients cannot have anyone with them due to current Covid guidelines and our immunocompromised population.

## 2021-06-16 NOTE — Progress Notes (Signed)
At 1330 pt expressed to RN that he was beginning to "itch all over and has a tingling sensation all over." Pt.'s face , chest, and back had a bright red appearance. Gazyva stopped immediately, vitals were taken, 1 L of NS started, solumedrol 125 mg IV given at 1330, Benadryl 25 mg IV given at 1331, and Pepcid 23m IVPB started at  141Dr. BRogue Bussingnotified. VSS. **See flowsheets for vitals ** Dr. BRogue Bussingat chairside to assess pt. At 1340 pt states that he starting to feel better. Per Dr. BRogue Bussingto not rechallenge gazyva infusion today, continue with the 1 Liter of NS, and observe pt until hives are gone and he feels back to baseline. Pt observed for another hour and half. When pt was assessed at 1500, redness appearance gone, hives had improved significantly, and VSS. MD notitied.Per MD pt may be discharge. RN educated pt on the importance of notifying the clinic if any complications occur at home and when to seek emergency care. Pt verbalized understanding. VSS. Pt stable for discharge, pt driven home by wife.   Takyah Ciaramitaro WCIGNA

## 2021-06-22 ENCOUNTER — Inpatient Hospital Stay: Payer: Medicare PPO

## 2021-06-22 ENCOUNTER — Encounter: Payer: Self-pay | Admitting: Internal Medicine

## 2021-06-22 ENCOUNTER — Inpatient Hospital Stay (HOSPITAL_BASED_OUTPATIENT_CLINIC_OR_DEPARTMENT_OTHER): Payer: Medicare PPO | Admitting: Internal Medicine

## 2021-06-22 VITALS — BP 101/73 | HR 84 | Resp 20

## 2021-06-22 DIAGNOSIS — F17210 Nicotine dependence, cigarettes, uncomplicated: Secondary | ICD-10-CM | POA: Diagnosis not present

## 2021-06-22 DIAGNOSIS — C9110 Chronic lymphocytic leukemia of B-cell type not having achieved remission: Secondary | ICD-10-CM

## 2021-06-22 DIAGNOSIS — Z79899 Other long term (current) drug therapy: Secondary | ICD-10-CM | POA: Diagnosis not present

## 2021-06-22 DIAGNOSIS — Z5112 Encounter for antineoplastic immunotherapy: Secondary | ICD-10-CM | POA: Diagnosis not present

## 2021-06-22 DIAGNOSIS — R5383 Other fatigue: Secondary | ICD-10-CM | POA: Diagnosis not present

## 2021-06-22 DIAGNOSIS — R590 Localized enlarged lymph nodes: Secondary | ICD-10-CM | POA: Diagnosis not present

## 2021-06-22 DIAGNOSIS — Z8269 Family history of other diseases of the musculoskeletal system and connective tissue: Secondary | ICD-10-CM | POA: Diagnosis not present

## 2021-06-22 DIAGNOSIS — Z9049 Acquired absence of other specified parts of digestive tract: Secondary | ICD-10-CM | POA: Diagnosis not present

## 2021-06-22 DIAGNOSIS — Z8249 Family history of ischemic heart disease and other diseases of the circulatory system: Secondary | ICD-10-CM | POA: Diagnosis not present

## 2021-06-22 LAB — CBC WITH DIFFERENTIAL/PLATELET
Abs Immature Granulocytes: 0.02 10*3/uL (ref 0.00–0.07)
Basophils Absolute: 0 10*3/uL (ref 0.0–0.1)
Basophils Relative: 1 %
Eosinophils Absolute: 0.1 10*3/uL (ref 0.0–0.5)
Eosinophils Relative: 2 %
HCT: 43.2 % (ref 39.0–52.0)
Hemoglobin: 15.2 g/dL (ref 13.0–17.0)
Immature Granulocytes: 1 %
Lymphocytes Relative: 10 %
Lymphs Abs: 0.4 10*3/uL — ABNORMAL LOW (ref 0.7–4.0)
MCH: 31.4 pg (ref 26.0–34.0)
MCHC: 35.2 g/dL (ref 30.0–36.0)
MCV: 89.3 fL (ref 80.0–100.0)
Monocytes Absolute: 0.1 10*3/uL (ref 0.1–1.0)
Monocytes Relative: 3 %
Neutro Abs: 3.3 10*3/uL (ref 1.7–7.7)
Neutrophils Relative %: 83 %
Platelets: 139 10*3/uL — ABNORMAL LOW (ref 150–400)
RBC: 4.84 MIL/uL (ref 4.22–5.81)
RDW: 13.3 % (ref 11.5–15.5)
WBC: 3.9 10*3/uL — ABNORMAL LOW (ref 4.0–10.5)
nRBC: 0 % (ref 0.0–0.2)

## 2021-06-22 LAB — COMPREHENSIVE METABOLIC PANEL
ALT: 12 U/L (ref 0–44)
AST: 19 U/L (ref 15–41)
Albumin: 3.8 g/dL (ref 3.5–5.0)
Alkaline Phosphatase: 65 U/L (ref 38–126)
Anion gap: 8 (ref 5–15)
BUN: 12 mg/dL (ref 8–23)
CO2: 23 mmol/L (ref 22–32)
Calcium: 9.2 mg/dL (ref 8.9–10.3)
Chloride: 104 mmol/L (ref 98–111)
Creatinine, Ser: 0.94 mg/dL (ref 0.61–1.24)
GFR, Estimated: >60 mL/min (ref >60)
Glucose, Bld: 117 mg/dL — ABNORMAL HIGH (ref 70–99)
Potassium: 4 mmol/L (ref 3.5–5.1)
Sodium: 135 mmol/L (ref 135–145)
Total Bilirubin: 0.6 mg/dL (ref 0.3–1.2)
Total Protein: 6.2 g/dL — ABNORMAL LOW (ref 6.5–8.1)

## 2021-06-22 LAB — PHOSPHORUS: Phosphorus: 3.3 mg/dL (ref 2.5–4.6)

## 2021-06-22 LAB — MAGNESIUM: Magnesium: 1.9 mg/dL (ref 1.7–2.4)

## 2021-06-22 LAB — LACTATE DEHYDROGENASE: LDH: 206 U/L — ABNORMAL HIGH (ref 98–192)

## 2021-06-22 MED ORDER — DIPHENHYDRAMINE HCL 50 MG/ML IJ SOLN
50.0000 mg | Freq: Once | INTRAMUSCULAR | Status: AC
  Administered 2021-06-22: 50 mg via INTRAVENOUS
  Filled 2021-06-22: qty 1

## 2021-06-22 MED ORDER — SODIUM CHLORIDE 0.9% FLUSH
10.0000 mL | Freq: Once | INTRAVENOUS | Status: AC
  Administered 2021-06-22: 10 mL via INTRAVENOUS
  Filled 2021-06-22: qty 10

## 2021-06-22 MED ORDER — ACETAMINOPHEN 325 MG PO TABS
650.0000 mg | ORAL_TABLET | Freq: Once | ORAL | Status: AC
  Administered 2021-06-22: 650 mg via ORAL
  Filled 2021-06-22: qty 2

## 2021-06-22 MED ORDER — SODIUM CHLORIDE 0.9 % IV SOLN
1000.0000 mg | Freq: Once | INTRAVENOUS | Status: AC
  Administered 2021-06-22: 1000 mg via INTRAVENOUS
  Filled 2021-06-22: qty 40

## 2021-06-22 MED ORDER — HEPARIN SOD (PORK) LOCK FLUSH 100 UNIT/ML IV SOLN
500.0000 [IU] | Freq: Once | INTRAVENOUS | Status: AC | PRN
  Administered 2021-06-22: 500 [IU]
  Filled 2021-06-22: qty 5

## 2021-06-22 MED ORDER — HEPARIN SOD (PORK) LOCK FLUSH 100 UNIT/ML IV SOLN
INTRAVENOUS | Status: AC
  Filled 2021-06-22: qty 5

## 2021-06-22 MED ORDER — SODIUM CHLORIDE 0.9 % IV SOLN
20.0000 mg | Freq: Once | INTRAVENOUS | Status: AC
  Administered 2021-06-22: 20 mg via INTRAVENOUS
  Filled 2021-06-22: qty 20

## 2021-06-22 MED ORDER — SODIUM CHLORIDE 0.9 % IV SOLN
Freq: Once | INTRAVENOUS | Status: AC
Start: 2021-06-22 — End: 2021-06-22
  Filled 2021-06-22: qty 250

## 2021-06-22 MED ORDER — MONTELUKAST SODIUM 10 MG PO TABS
10.0000 mg | ORAL_TABLET | Freq: Every day | ORAL | Status: DC
  Administered 2021-06-22: 10 mg via ORAL
  Filled 2021-06-22: qty 1

## 2021-06-22 NOTE — Progress Notes (Signed)
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   AUG 2021-Head and neck: The brain demonstrates symmetric appearing diffuse physiologic uptake without focal finding to suggest abnormality. In the left posterior and medial maxillary cavity, there is an area of mucoid deposition seen within SUV max of 28.8. This was seen in the previous study of November. The specific bony erosion, clear-cut soft tissue lesion is not detected. This probably represents a chronic sinus disease focus. The bilateral optic nerves and extraocular muscles to the largest nodes are seen on axial image 98. 1 now demonstrates a short axis measurement of 3.3 cm and the other more medially, a short axis measurement of 2.5 cm. Demonstrate symmetric increased uptake. The left, much greater than right, jugulodigastric and supraclavicular nodes have increased significantly in size and avidity since the previous study. Both of these lymph nodes demonstrate an SUV max of 3.9. No largest lymph node in the right subclavicular space is seen on axial image 104, demonstrating an SUV max of 3.3, and a short axis measurement of 1.4 cm.   On axial image 102, a right paratracheal lymph node is again identified with increased avidity, located immediately posterior to the right thyroid pole. This area was noted previously, but now increased avidity is seen, with an SUV  max of 8.6 in this area. Multiple smaller, and less avid lymph nodes scattered throughout the left greater than right neck are seen, as well as supraclavicular and infraclavicular areas.   Thorax: Significant axillary lymph nodes have increased significantly in size. These are also left greater than right, with the largest on the left seen on image #114 with a short axis measurement of 2.2 cm and an SUV max of 4.3. The largest on the right as seen on image 111, demonstrating a short axis of 1.4 cm and an SUV max of 4.0. Too numerous to count smaller and similarly avid lymph nodes in both axillae are also noted. These are all larger than previously seen and in general, more avid.  Numerous small AP window and lesser mediastinal lymph nodes are seen with minimal increased avidity, appearing similar in size and avidity to the previous study. No other significant mediastinal adenopathy. No hilar adenopathy detected. These likely also represent lymphoproliferative disease.  Hyperinflation of COPD, bibasilar dependent atelectasis, some paucity of lung tissue and biapical scarring are noted without other focal pulmonary mass or nodule, or acute pulmonary parenchymal disease. Physiologic uptake in the left heart is seen, as expected. Small hiatal hernia is again identified.   Abdomen pelvis: Physiologic uptake in the liver, gut and urinary tract is again noted. No focal area of asymmetric uptake in the structures is seen. The spleen also demonstrates generalized increased uptake and stable splenomegaly, up to 14.2 cm. On image 198, a preaortic lymph node is detected of increased size since the previous study, now measuring up to 1.2 cm short axis.  Several large lymph nodes on image 203 of the mesentery are  noted. The largest of these, on image 201 and ureters up to 1.8 cm short axis. Despite the fact that measurable avidity is not detected in these are the surrounding lymph nodes, these are doubtless involved in the  lymphoproliferative disease described, and appear larger. Multiple smaller lymph nodes scattered throughout the mesentery are also seen.  Grossly enlarged right greater than left common iliac chain lymph nodes are again seen, throughout the pelvis and into the inguinal areas bilaterally. The most avid on the right is seen on image #260 to, demonstrates a short axis of 2.5 cm, and an SUV max of 4.9. The largest on the left is identified image #272, demonstrates a short axis of 2.0 cm, and an SUV max of 4.7. These are similar to the previously noted values.  Right greater than left inguinal adenopathy is seen, with the largest lymph node seen on the right image 276, demonstrating a short axis of 1.5 cm and an SUV max of 3.9. This is larger in size and avidity than previously seen. There are bilateral enlarged lymph nodes associated with the external iliac vessels on image 277. These measure 2.0 cm on the right in short axis, with an SUV max of 3.9.  On the left, image 278, the external iliac lymph node is larger at 1.8 cm short axis, but with an SUV max of only 3.8. Lesser avid and smaller lymph nodes on both sides are also detected.   Musculoskeletal: Scattered, insignificant uptake is detected in the muscles especially in the bilateral joints, without focal finding to suggest lymphoproliferative disease.   IMPRESSIONS:  Significant increase in numbers, short axis size, and to a large extent, avidity -in the lymph nodes of the left greater than right neck, supraclavicular and infraclavicular areas, bilateral axillary lymph nodes and common iliac chain, external iliac chain and inguinal lymph nodes, consistent with worsening lymphoproliferative disease. The mediastinal lymph nodes seen are still present and unchanged in size or [significantly] in avidity.    [place]- Bx- SLL/CLL.July 2022-Discussed with Dr. Asquith-oncologist, [place]-reviewed the pathology of the lymph node biopsy in 2021-positive for SLL;  negative for cyclin D1/1114 translocation.    # July 20th, 2022- GAZYVA #1 Aletha Halim plan ading VENATOCLAX after 2 cycles/de-bulking]  # Lung cancer screening- on [facility]  # SURVIVORSHIP:   # GENETICS:   DIAGNOSIS:   STAGE:         ;  GOALS:  CURRENT/MOST RECENT THERAPY :       CLL (chronic lymphocytic leukemia) ([place])  10/29/2019 Initial Diagnosis   CLL (chronic lymphocytic leukemia) ([place])   06/15/2021 -  Chemotherapy    Patient is on Treatment Plan: LYMPHOMA CLL/SLL VENETOCLAX + OBINUTUZUMAB Q28D          INTERVAL HISTORY:  A pleasant 70 year-old male patient with CLL/SLL -stage IV bulky adenopathy-currently on Gazyva [plan to add venetoclax with cycle #3]-is here for follow-up.  Patient had moderate severe infusion reaction to Gazyva cycle number 1 day 2.  Improved with steroids/Benadryl.   Patient notes to have improvement of his neck adenopathy and underarm lymph nodes.   Review of Systems  Constitutional:  Positive for malaise/fatigue. Negative for chills, diaphoresis, fever and weight loss.  HENT:  Negative for nosebleeds and sore throat.   Eyes:  Negative for double vision.  Respiratory:  Negative for cough, hemoptysis, sputum production, shortness of breath and wheezing.   Cardiovascular:  Negative for chest pain, palpitations, orthopnea and leg swelling.  Gastrointestinal:  Negative for abdominal pain, blood  in stool, constipation, diarrhea, heartburn, melena, nausea and vomiting.  Genitourinary:  Negative for dysuria, frequency and urgency.  Musculoskeletal:  Negative for back pain and joint pain.  Skin: Negative.  Negative for itching and rash.  Neurological:  Negative for dizziness, tingling, focal weakness, weakness and headaches.  Endo/Heme/Allergies:  Does not bruise/bleed easily.  Psychiatric/Behavioral:  Negative for depression. The patient is not nervous/anxious and does not have insomnia.     PAST MEDICAL HISTORY :  Past Medical History:  Diagnosis  Date   Atherosclerosis of coronary artery 09/28/2016   Chest CT Nov 2017   Elevated C-reactive protein (CRP)    Elevated PSA    Fractured sternum 1999   Herpes    History of rib fracture 1999   multiple   Hyperlipidemia    Psoriasis    Rheumatoid factor positive April 2015   Rosacea    Thoracic aortic atherosclerosis ([place]) 09/28/2016   Chest CT Nov 2017   Tobacco abuse    Vitamin D deficiency disease     PAST SURGICAL HISTORY :   Past Surgical History:  Procedure Laterality Date   APPENDECTOMY  1974   PORTA CATH INSERTION N/A 06/06/2021   Procedure: PORTA CATH INSERTION;  Surgeon: Algernon Huxley, MD;  Location: [facility];  Service: Cardiovascular;  Laterality: N/A;   SHOULDER ARTHROSCOPY  10/23/2016   Procedure: ARTHROSCOPY SHOULDER WITH DISTAL CLAVICLE EXCISION, PARTIAL ACROMIONECTOMY, AND DEBRIDEMENT;  Surgeon: Earnestine Leys, MD;  Location: [facility];  Service: Orthopedics;;   TIBIA FRACTURE SURGERY Left 2004   titanium rod, ORIF    FAMILY HISTORY :   Family History  Problem Relation Age of Onset   Heart disease Father    Osteoarthritis Father    Alzheimer's disease Father    Cancer Neg Hx    Diabetes Neg Hx    Hypertension Neg Hx    Stroke Neg Hx    COPD Neg Hx     SOCIAL HISTORY:   Social History   Tobacco Use   Smoking status: Every Day    Packs/day: 0.50    Years: 45.00    Pack years: 22.50    Types: Cigarettes   Smokeless tobacco: Never   Tobacco comments:    11/6- currently smoking 5 cigarettes a day   Vaping Use   Vaping Use: Never used  Substance Use Topics   Alcohol use: Yes    Alcohol/week: 3.0 standard drinks    Types: 3 Glasses of wine per week    Comment: a couple glasses of wine a week   Drug use: No    ALLERGIES:  is allergic to gluten meal.  MEDICATIONS:  Current Outpatient Medications  Medication Sig Dispense Refill   acetaminophen (TYLENOL) 325 MG tablet Take by mouth every 6 (six) hours as needed.     acyclovir  (ZOVIRAX) 400 MG tablet Take 1 tablet (400 mg total) by mouth 2 (two) times daily. 60 tablet 4   allopurinol (ZYLOPRIM) 300 MG tablet Take 1 tablet (300 mg total) by mouth 2 (two) times daily. 120 tablet 0   diphenhydrAMINE (BENADRYL) 25 MG tablet Take 25 mg by mouth every 6 (six) hours as needed.     lidocaine-prilocaine (EMLA) cream Apply 30 -45 mins prior to port access. 30 g 0   montelukast (SINGULAIR) 10 MG tablet Take 1 tablet (10 mg total) by mouth at bedtime. Start 2 days prior to infusion. Do not take on the day of infusion. 60 tablet 0  venetoclax (VENCLEXTA) 100 MG tablet  (Patient not taking: Reported on 06/22/2021)     No current facility-administered medications for this visit.   Facility-Administered Medications Ordered in Other Visits  Medication Dose Route Frequency Provider Last Rate Last Admin   montelukast (SINGULAIR) tablet 10 mg  10 mg Oral QHS Charlaine Dalton R, MD   10 mg at 06/22/21 1004    PHYSICAL EXAMINATION: ECOG PERFORMANCE STATUS: 0 - Asymptomatic  BP 117/81 (BP Location: Right Arm, Patient Position: Sitting, Cuff Size: Normal)   Pulse 97   Temp 97.8 F (36.6 C) (Tympanic)   Resp 16   Ht 5' 6"  (1.676 m)   Wt 160 lb (72.6 kg)   SpO2 99%   BMI 25.82 kg/m   Filed Weights   06/22/21 0843  Weight: 160 lb (72.6 kg)    Physical Exam Constitutional:      Comments: Accompanied by his wife.  Ambulating independently.  Bulky adenopathy noted left supraclavicular region [8cm]. ~7 cm lymph node felt in the left underarm; 5cm lymph node in the right underarm.  ~3 cm  lymph node felt in the right and left groin.  HENT:     Head: Normocephalic and atraumatic.     Mouth/Throat:     Pharynx: No oropharyngeal exudate.  Eyes:     Pupils: Pupils are equal, round, and reactive to light.  Cardiovascular:     Rate and Rhythm: Normal rate and regular rhythm.  Pulmonary:     Effort: No respiratory distress.     Breath sounds: No wheezing.  Abdominal:      General: Bowel sounds are normal. There is no distension.     Palpations: Abdomen is soft. There is no mass.     Tenderness: There is no abdominal tenderness. There is no guarding or rebound.  Musculoskeletal:        General: No tenderness. Normal range of motion.     Cervical back: Normal range of motion and neck supple.  Skin:    General: Skin is warm.  Neurological:     Mental Status: He is alert and oriented to person, place, and time.  Psychiatric:        Mood and Affect: Affect normal.    LABORATORY DATA:  I have reviewed the data as listed    Component Value Date/Time   NA 135 06/22/2021 0821   K 4.0 06/22/2021 0821   CL 104 06/22/2021 0821   CO2 23 06/22/2021 0821   GLUCOSE 117 (H) 06/22/2021 0821   BUN 12 06/22/2021 0821   CREATININE 0.94 06/22/2021 0821   CREATININE 0.84 10/06/2019 0000   CALCIUM 9.2 06/22/2021 0821   PROT 6.2 (L) 06/22/2021 0821   ALBUMIN 3.8 06/22/2021 0821   AST 19 06/22/2021 0821   ALT 12 06/22/2021 0821   ALKPHOS 65 06/22/2021 0821   BILITOT 0.6 06/22/2021 0821   GFRNONAA >60 06/22/2021 0821   GFRNONAA 91 10/06/2019 0000   GFRAA >60 12/26/2019 0842   GFRAA 106 10/06/2019 0000    No results found for: SPEP, UPEP  Lab Results  Component Value Date   WBC 3.9 (L) 06/22/2021   NEUTROABS 3.3 06/22/2021   HGB 15.2 06/22/2021   HCT 43.2 06/22/2021   MCV 89.3 06/22/2021   PLT 139 (L) 06/22/2021      Chemistry      Component Value Date/Time   NA 135 06/22/2021 0821   K 4.0 06/22/2021 0821   CL 104 06/22/2021 0821   CO2 23  06/22/2021 0821   BUN 12 06/22/2021 0821   CREATININE 0.94 06/22/2021 0821   CREATININE 0.84 10/06/2019 0000      Component Value Date/Time   CALCIUM 9.2 06/22/2021 0821   ALKPHOS 65 06/22/2021 0821   AST 19 06/22/2021 0821   ALT 12 06/22/2021 0821   BILITOT 0.6 06/22/2021 0821       RADIOGRAPHIC STUDIES: I have personally reviewed the radiological images as listed and agreed with the findings in the  report. No results found.   ASSESSMENT & PLAN:   .CLL (chronic lymphocytic leukemia) ([place]) #SLL/CLL- PET scan- June 2022- Marked progression of lymphoproliferative process -Axillary /neck retroperitoneal adenopathy.  Currently on Gazyva 6 cycles+ venetoclax [plan to add a second #3] 12 months therapy.  # Pt tolerated with moderate infusion reaction- with cycle #1- day-2. Again reviewed the possibility of infusion reaction.  Patient is well premedicated with Tylenol/Singulair/Benadryl.  #Proceed with cycle number 1 day 8 of Gazyva today again. Labs today reviewed;  acceptable for treatment today.   #Recommend shingles/TLS prophylaxis: Acyclovir/allopurinol.  Stable  # EVUSHLED: s/p  COVID EVUSHELD PROPHYLAXIS  #Mediport placement: No malfunction noted  # DISPOSITION: # infusion today.  # Follow up in 1 week [wed].  MD; labs- cbc/cmp; uric acid;mag' phos LDH- Gazyva- Dr.B           Orders Placed This Encounter  Procedures   Magnesium    Standing Status:   Future    Standing Expiration Date:   06/22/2022         Cammie Sickle, MD 06/22/2021 7:42 PM

## 2021-06-22 NOTE — Assessment & Plan Note (Addendum)
#  SLL/CLL- PET scan- June 2022- Marked progression of lymphoproliferative process -Axillary /neck retroperitoneal adenopathy.  Currently on Gazyva 6 cycles+ venetoclax [plan to add a second #3] 12 months therapy.  # Pt tolerated with moderate infusion reaction- with cycle #1- day-2. Again reviewed the possibility of infusion reaction.  Patient is well premedicated with Tylenol/Singulair/Benadryl.  #Proceed with cycle number 1 day 8 of Gazyva today again. Labs today reviewed;  acceptable for treatment today.   #Recommend shingles/TLS prophylaxis: Acyclovir/allopurinol.  Stable  # EVUSHLED: s/p  COVID EVUSHELD PROPHYLAXIS  #Mediport placement: No malfunction noted  # DISPOSITION: # infusion today.  # Follow up in 1 week [wed].  MD; labs- cbc/cmp; uric acid;mag' phos LDH- Gazyva- Dr.B

## 2021-06-22 NOTE — Patient Instructions (Signed)
[facility] ONCOLOGY  Discharge Instructions: Thank you for choosing [facility] to provide your oncology and hematology care.  If you have a lab appointment with the [facility], please go directly to the [facility] and check in at the registration area.  Wear comfortable clothing and clothing appropriate for easy access to any Portacath or PICC line.   We strive to give you quality time with your provider. You may need to reschedule your appointment if you arrive late (15 or more minutes).  Arriving late affects you and other patients whose appointments are after yours.  Also, if you miss three or more appointments without notifying the office, you may be dismissed from the clinic at the provider's discretion.      For prescription refill requests, have your pharmacy contact our office and allow 72 hours for refills to be completed.    Today you received the following chemotherapy and/or immunotherapy agents: Gazyva      To help prevent nausea and vomiting after your treatment, we encourage you to take your nausea medication as directed.  BELOW ARE SYMPTOMS THAT SHOULD BE REPORTED IMMEDIATELY: *FEVER GREATER THAN 100.4 F (38 C) OR HIGHER *CHILLS OR SWEATING *NAUSEA AND VOMITING THAT IS NOT CONTROLLED WITH YOUR NAUSEA MEDICATION *UNUSUAL SHORTNESS OF BREATH *UNUSUAL BRUISING OR BLEEDING *URINARY PROBLEMS (pain or burning when urinating, or frequent urination) *BOWEL PROBLEMS (unusual diarrhea, constipation, pain near the anus) TENDERNESS IN MOUTH AND THROAT WITH OR WITHOUT PRESENCE OF ULCERS (sore throat, sores in mouth, or a toothache) UNUSUAL RASH, SWELLING OR PAIN  UNUSUAL VAGINAL DISCHARGE OR ITCHING   Items with * indicate a potential emergency and should be followed up as soon as possible or go to the Emergency Department if any problems should occur.  Please show the CHEMOTHERAPY ALERT CARD or IMMUNOTHERAPY ALERT CARD at check-in to  the Emergency Department and triage nurse.  Should you have questions after your visit or need to cancel or reschedule your appointment, please contact [facility]  [phone] and follow the prompts.  Office hours are 8:00 a.m. to 4:30 p.m. Monday - Friday. Please note that voicemails left after 4:00 p.m. may not be returned until the following business day.  We are closed weekends and major holidays. You have access to a nurse at all times for urgent questions. Please call the main number to the clinic [phone] and follow the prompts.  For any non-urgent questions, you may also contact your provider using MyChart. We now offer e-Visits for anyone 32 and older to request care online for non-urgent symptoms. For details visit [email or web address].   Also download the MyChart app! Go to the app store, search "MyChart", open the app, select [facility], and log in with your MyChart username and password.  Due to Covid, a mask is required upon entering the hospital/clinic. If you do not have a mask, one will be given to you upon arrival. For doctor visits, patients may have 1 support person aged 41 or older with them. For treatment visits, patients cannot have anyone with them due to current Covid guidelines and our immunocompromised population. Obinutuzumab injection What is this medication? OBINUTUZUMAB (OH bi nue TOOZ ue mab) is a monoclonal antibody. It is used to treat chronic lymphocytic leukemia (CLL) and a type of non-Hodgkin lymphoma(NHL), follicular lymphoma. This medicine may be used for other purposes; ask your health care provider orpharmacist if you have questions. COMMON BRAND NAME(S): GAZYVA What  should I tell my care team before I take this medication? They need to know if you have any of these conditions: infection (especially a virus infection such as hepatitis B virus) lung or breathing disease heart disease take medicines that treat or  prevent blood clots an unusual or allergic reaction to obinutuzumab, other medicines, foods, dyes, or preservatives pregnant or trying to get pregnant breast-feeding How should I use this medication? This medicine is for infusion into a vein. It is given by a health careprofessional in a hospital or clinic setting. Talk to your pediatrician regarding the use of this medicine in children.Special care may be needed. Overdosage: If you think you have taken too much of this medicine contact apoison control center or emergency room at once. NOTE: This medicine is only for you. Do not share this medicine with others. What if I miss a dose? Keep appointments for follow-up doses as directed. It is important not to miss your dose. Call your doctor or health care professional if you are unable tokeep an appointment. What may interact with this medication? live virus vaccines This list may not describe all possible interactions. Give your health care provider a list of all the medicines, herbs, non-prescription drugs, or dietary supplements you use. Also tell them if you smoke, drink alcohol, or use illegaldrugs. Some items may interact with your medicine. What should I watch for while using this medication? Report any side effects that you notice during your treatment right away, such as changes in your breathing, fever, chills, dizziness or lightheadedness.These effects are more common with the first dose. Visit your prescriber or health care professional for checks on your progress. You will need to have regular blood work. Report any other side effects. The side effects of this medicine can continue after you finish your treatment. Continue your course of treatment even though you feel ill unless your doctortells you to stop. Call your doctor or health care professional for advice if you get a fever, chills or sore throat, or other symptoms of a cold or flu. Do not treat yourself. This drug decreases your  body's ability to fight infections. Try toavoid being around people who are sick. This medicine may increase your risk to bruise or bleed. Call your doctor orhealth care professional if you notice any unusual bleeding. Do not become pregnant while taking this medicine or for 6 months after stopping it. Women should inform their doctor if they wish to become pregnant or think they might be pregnant. There is a potential for serious side effects to an unborn child. Talk to your health care professional or pharmacist for more information. Do not breast-feed an infant while taking this medicine orfor 6 months after stopping it. What side effects may I notice from receiving this medication? Side effects that you should report to your doctor or health care professionalas soon as possible: allergic reactions like skin rash, itching or hives, swelling of the face, lips, or tongue breathing problems changes in vision chest pain or chest tightness confusion dizziness loss of balance or coordination low blood counts - this medicine may decrease the number of white blood cells, red blood cells and platelets. You may be at increased risk for infections and bleeding. signs of decreased platelets or bleeding - bruising, pinpoint red spots on the skin, black, tarry stools, blood in the urine signs of infection - fever or chills, cough, sore throat, pain or trouble passing urine signs and symptoms of liver injury like dark yellow  or brown urine; general ill feeling or flu-like symptoms; light-colored stools; loss of appetite; nausea; right upper belly pain; unusually weak or tired; yellowing of the eyes or skin trouble speaking or understanding trouble walking vomiting Side effects that usually do not require medical attention (report to yourdoctor or health care professional if they continue or are bothersome): constipation joint pain muscle pain This list may not describe all possible side effects. Call your  doctor for medical advice about side effects. You may report side effects to FDA at1-800-FDA-1088. Where should I keep my medication? This drug is only given in a hospital or clinic and will not be stored at home. NOTE: This sheet is a summary. It may not cover all possible information. If you have questions about this medicine, talk to your doctor, pharmacist, orhealth care provider.  2022 Elsevier/Gold Standard (2019-02-25 15:34:53)

## 2021-06-29 ENCOUNTER — Inpatient Hospital Stay: Payer: Medicare PPO | Attending: Internal Medicine

## 2021-06-29 ENCOUNTER — Inpatient Hospital Stay (HOSPITAL_BASED_OUTPATIENT_CLINIC_OR_DEPARTMENT_OTHER): Payer: Medicare PPO | Admitting: Internal Medicine

## 2021-06-29 ENCOUNTER — Inpatient Hospital Stay: Payer: Medicare PPO

## 2021-06-29 ENCOUNTER — Encounter: Payer: Self-pay | Admitting: Internal Medicine

## 2021-06-29 VITALS — BP 116/77 | HR 84 | Temp 96.0°F | Resp 18

## 2021-06-29 DIAGNOSIS — Z79899 Other long term (current) drug therapy: Secondary | ICD-10-CM | POA: Diagnosis not present

## 2021-06-29 DIAGNOSIS — Z8261 Family history of arthritis: Secondary | ICD-10-CM | POA: Diagnosis not present

## 2021-06-29 DIAGNOSIS — R590 Localized enlarged lymph nodes: Secondary | ICD-10-CM | POA: Diagnosis not present

## 2021-06-29 DIAGNOSIS — Z818 Family history of other mental and behavioral disorders: Secondary | ICD-10-CM | POA: Insufficient documentation

## 2021-06-29 DIAGNOSIS — R5383 Other fatigue: Secondary | ICD-10-CM | POA: Diagnosis not present

## 2021-06-29 DIAGNOSIS — Z9049 Acquired absence of other specified parts of digestive tract: Secondary | ICD-10-CM | POA: Insufficient documentation

## 2021-06-29 DIAGNOSIS — Z95828 Presence of other vascular implants and grafts: Secondary | ICD-10-CM

## 2021-06-29 DIAGNOSIS — Z7289 Other problems related to lifestyle: Secondary | ICD-10-CM | POA: Diagnosis not present

## 2021-06-29 DIAGNOSIS — Z8249 Family history of ischemic heart disease and other diseases of the circulatory system: Secondary | ICD-10-CM | POA: Insufficient documentation

## 2021-06-29 DIAGNOSIS — Z5112 Encounter for antineoplastic immunotherapy: Secondary | ICD-10-CM | POA: Diagnosis not present

## 2021-06-29 DIAGNOSIS — F17210 Nicotine dependence, cigarettes, uncomplicated: Secondary | ICD-10-CM | POA: Diagnosis not present

## 2021-06-29 DIAGNOSIS — C9110 Chronic lymphocytic leukemia of B-cell type not having achieved remission: Secondary | ICD-10-CM | POA: Diagnosis not present

## 2021-06-29 LAB — CBC WITH DIFFERENTIAL/PLATELET
Abs Immature Granulocytes: 0.03 K/uL (ref 0.00–0.07)
Basophils Absolute: 0.1 K/uL (ref 0.0–0.1)
Basophils Relative: 1 %
Eosinophils Absolute: 0.1 K/uL (ref 0.0–0.5)
Eosinophils Relative: 2 %
HCT: 42.6 % (ref 39.0–52.0)
Hemoglobin: 14.9 g/dL (ref 13.0–17.0)
Immature Granulocytes: 1 %
Lymphocytes Relative: 13 %
Lymphs Abs: 0.7 K/uL (ref 0.7–4.0)
MCH: 31 pg (ref 26.0–34.0)
MCHC: 35 g/dL (ref 30.0–36.0)
MCV: 88.8 fL (ref 80.0–100.0)
Monocytes Absolute: 0.2 K/uL (ref 0.1–1.0)
Monocytes Relative: 3 %
Neutro Abs: 4.3 K/uL (ref 1.7–7.7)
Neutrophils Relative %: 80 %
Platelets: 151 K/uL (ref 150–400)
RBC: 4.8 MIL/uL (ref 4.22–5.81)
RDW: 13.1 % (ref 11.5–15.5)
WBC: 5.4 K/uL (ref 4.0–10.5)
nRBC: 0 % (ref 0.0–0.2)

## 2021-06-29 LAB — COMPREHENSIVE METABOLIC PANEL WITH GFR
ALT: 14 U/L (ref 0–44)
AST: 22 U/L (ref 15–41)
Albumin: 3.8 g/dL (ref 3.5–5.0)
Alkaline Phosphatase: 73 U/L (ref 38–126)
Anion gap: 7 (ref 5–15)
BUN: 13 mg/dL (ref 8–23)
CO2: 24 mmol/L (ref 22–32)
Calcium: 8.9 mg/dL (ref 8.9–10.3)
Chloride: 102 mmol/L (ref 98–111)
Creatinine, Ser: 0.9 mg/dL (ref 0.61–1.24)
GFR, Estimated: >60 mL/min
Glucose, Bld: 126 mg/dL — ABNORMAL HIGH (ref 70–99)
Potassium: 4 mmol/L (ref 3.5–5.1)
Sodium: 133 mmol/L — ABNORMAL LOW (ref 135–145)
Total Bilirubin: 0.8 mg/dL (ref 0.3–1.2)
Total Protein: 6.4 g/dL — ABNORMAL LOW (ref 6.5–8.1)

## 2021-06-29 LAB — LACTATE DEHYDROGENASE: LDH: 288 U/L — ABNORMAL HIGH (ref 98–192)

## 2021-06-29 LAB — MAGNESIUM: Magnesium: 1.9 mg/dL (ref 1.7–2.4)

## 2021-06-29 LAB — URIC ACID: Uric Acid, Serum: 2.7 mg/dL — ABNORMAL LOW (ref 3.7–8.6)

## 2021-06-29 MED ORDER — SODIUM CHLORIDE 0.9 % IV SOLN
1000.0000 mg | Freq: Once | INTRAVENOUS | Status: AC
  Administered 2021-06-29: 1000 mg via INTRAVENOUS
  Filled 2021-06-29: qty 40

## 2021-06-29 MED ORDER — SODIUM CHLORIDE 0.9 % IV SOLN
Freq: Once | INTRAVENOUS | Status: AC
Start: 2021-06-29 — End: 2021-06-29
  Filled 2021-06-29: qty 250

## 2021-06-29 MED ORDER — SODIUM CHLORIDE 0.9 % IV SOLN
20.0000 mg | Freq: Once | INTRAVENOUS | Status: AC
  Administered 2021-06-29: 20 mg via INTRAVENOUS
  Filled 2021-06-29: qty 20

## 2021-06-29 MED ORDER — MONTELUKAST SODIUM 10 MG PO TABS
10.0000 mg | ORAL_TABLET | Freq: Every day | ORAL | Status: DC
  Administered 2021-06-29: 10 mg via ORAL
  Filled 2021-06-29: qty 1

## 2021-06-29 MED ORDER — HEPARIN SOD (PORK) LOCK FLUSH 100 UNIT/ML IV SOLN
500.0000 [IU] | Freq: Once | INTRAVENOUS | Status: DC | PRN
Start: 2021-06-29 — End: 2021-06-29
  Filled 2021-06-29: qty 5

## 2021-06-29 MED ORDER — SODIUM CHLORIDE 0.9% FLUSH
10.0000 mL | Freq: Once | INTRAVENOUS | Status: AC
  Administered 2021-06-29: 10 mL via INTRAVENOUS
  Filled 2021-06-29: qty 10

## 2021-06-29 MED ORDER — HEPARIN SOD (PORK) LOCK FLUSH 100 UNIT/ML IV SOLN
500.0000 [IU] | Freq: Once | INTRAVENOUS | Status: AC
  Administered 2021-06-29: 500 [IU] via INTRAVENOUS
  Filled 2021-06-29: qty 5

## 2021-06-29 MED ORDER — HEPARIN SOD (PORK) LOCK FLUSH 100 UNIT/ML IV SOLN
INTRAVENOUS | Status: AC
  Filled 2021-06-29: qty 5

## 2021-06-29 MED ORDER — ACETAMINOPHEN 325 MG PO TABS
650.0000 mg | ORAL_TABLET | Freq: Once | ORAL | Status: AC
  Administered 2021-06-29: 650 mg via ORAL
  Filled 2021-06-29: qty 2

## 2021-06-29 MED ORDER — DIPHENHYDRAMINE HCL 50 MG/ML IJ SOLN
50.0000 mg | Freq: Once | INTRAMUSCULAR | Status: AC
  Administered 2021-06-29: 50 mg via INTRAVENOUS
  Filled 2021-06-29: qty 1

## 2021-06-29 NOTE — Progress Notes (Signed)
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   AUG 2021-Head and neck: The brain demonstrates symmetric appearing diffuse physiologic uptake without focal finding to suggest abnormality. In the left posterior and medial maxillary cavity, there is an area of mucoid deposition seen within SUV max of 28.8. This was seen in the previous study of November. The specific bony erosion, clear-cut soft tissue lesion is not detected. This probably represents a chronic sinus disease focus. The bilateral optic nerves and extraocular muscles to the largest nodes are seen on axial image 98. 1 now demonstrates a short axis measurement of 3.3 cm and the other more medially, a short axis measurement of 2.5 cm. Demonstrate symmetric increased uptake. The left, much greater than right, jugulodigastric and supraclavicular nodes have increased significantly in size and avidity since the previous study. Both of these lymph nodes demonstrate an SUV max of 3.9. No largest lymph node in the right subclavicular space is seen on axial image 104, demonstrating an SUV max of 3.3, and a short axis measurement of 1.4 cm.   On axial image 102, a right paratracheal lymph node is again identified with increased avidity, located immediately posterior to the right thyroid pole. This area was noted previously, but now increased avidity is seen, with an SUV  max of 8.6 in this area. Multiple smaller, and less avid lymph nodes scattered throughout the left greater than right neck are seen, as well as supraclavicular and infraclavicular areas.   Thorax: Significant axillary lymph nodes have increased significantly in size. These are also left greater than right, with the largest on the left seen on image #114 with a short axis measurement of 2.2 cm and an SUV max of 4.3. The largest on the right as seen on image 111, demonstrating a short axis of 1.4 cm and an SUV max of 4.0. Too numerous to count smaller and similarly avid lymph nodes in both axillae are also noted. These are all larger than previously seen and in general, more avid.  Numerous small AP window and lesser mediastinal lymph nodes are seen with minimal increased avidity, appearing similar in size and avidity to the previous study. No other significant mediastinal adenopathy. No hilar adenopathy detected. These likely also represent lymphoproliferative disease.  Hyperinflation of COPD, bibasilar dependent atelectasis, some paucity of lung tissue and biapical scarring are noted without other focal pulmonary mass or nodule, or acute pulmonary parenchymal disease. Physiologic uptake in the left heart is seen, as expected. Small hiatal hernia is again identified.   Abdomen pelvis: Physiologic uptake in the liver, gut and urinary tract is again noted. No focal area of asymmetric uptake in the structures is seen. The spleen also demonstrates generalized increased uptake and stable splenomegaly, up to 14.2 cm. On image 198, a preaortic lymph node is detected of increased size since the previous study, now measuring up to 1.2 cm short axis.  Several large lymph nodes on image 203 of the mesentery are  noted. The largest of these, on image 201 and ureters up to 1.8 cm short axis. Despite the fact that measurable avidity is not detected in these are the surrounding lymph nodes, these are doubtless involved in the  lymphoproliferative disease described, and appear larger. Multiple smaller lymph nodes scattered throughout the mesentery are also seen.  Grossly enlarged right greater than left common iliac chain lymph nodes are again seen, throughout the pelvis and into the inguinal areas bilaterally. The most avid on the right is seen on image #260 to, demonstrates a short axis of 2.5 cm, and an SUV max of 4.9. The largest on the left is identified image #272, demonstrates a short axis of 2.0 cm, and an SUV max of 4.7. These are similar to the previously noted values.  Right greater than left inguinal adenopathy is seen, with the largest lymph node seen on the right image 276, demonstrating a short axis of 1.5 cm and an SUV max of 3.9. This is larger in size and avidity than previously seen. There are bilateral enlarged lymph nodes associated with the external iliac vessels on image 277. These measure 2.0 cm on the right in short axis, with an SUV max of 3.9.  On the left, image 278, the external iliac lymph node is larger at 1.8 cm short axis, but with an SUV max of only 3.8. Lesser avid and smaller lymph nodes on both sides are also detected.   Musculoskeletal: Scattered, insignificant uptake is detected in the muscles especially in the bilateral joints, without focal finding to suggest lymphoproliferative disease.   IMPRESSIONS:  Significant increase in numbers, short axis size, and to a large extent, avidity -in the lymph nodes of the left greater than right neck, supraclavicular and infraclavicular areas, bilateral axillary lymph nodes and common iliac chain, external iliac chain and inguinal lymph nodes, consistent with worsening lymphoproliferative disease. The mediastinal lymph nodes seen are still present and unchanged in size or [significantly] in avidity.    [facility]- Bx- SLL/CLL.July 2022-Discussed with Dr. Asquith-oncologist, [place]-reviewed the pathology of the lymph node biopsy in 2021-positive for SLL;  negative for cyclin D1/1114 translocation.    # July 20th, 2022- GAZYVA #1 Aletha Halim plan ading VENATOCLAX after 2 cycles/de-bulking]  # Lung cancer screening- on [facility]  # SURVIVORSHIP:   # GENETICS:   DIAGNOSIS:   STAGE:         ;  GOALS:  CURRENT/MOST RECENT THERAPY :       CLL (chronic lymphocytic leukemia) ([place])  10/29/2019 Initial Diagnosis   CLL (chronic lymphocytic leukemia) ([facility])   06/15/2021 -  Chemotherapy    Patient is on Treatment Plan: LYMPHOMA CLL/SLL VENETOCLAX + OBINUTUZUMAB Q28D          INTERVAL HISTORY:  A pleasant 70 year-old male patient with CLL/SLL -stage IV bulky adenopathy-currently on Gazyva [plan to add venetoclax with cycle #3]-is here for follow-up.  Patient denies any significant reaction with Gazyva cycle number 2-day 8.  Tolerated well.  He continues to notice improvement of his neck adenopathy and bilateral underarm lymph nodes.  No nausea no vomiting.  No difficulty swallowing.  No swelling in the legs.  Is very compliant with his recommended and t premedications.  Review of Systems  Constitutional:  Positive for malaise/fatigue. Negative for chills, diaphoresis, fever and weight loss.  HENT:  Negative for nosebleeds and sore throat.   Eyes:  Negative for double vision.  Respiratory:  Negative for cough, hemoptysis, sputum production, shortness of breath and wheezing.  Cardiovascular:  Negative for chest pain, palpitations, orthopnea and leg swelling.  Gastrointestinal:  Negative for abdominal pain, blood in stool, constipation, diarrhea, heartburn, melena, nausea and vomiting.  Genitourinary:  Negative for dysuria, frequency and urgency.  Musculoskeletal:  Negative for back pain and joint pain.  Skin: Negative.  Negative for itching and rash.  Neurological:  Negative for dizziness, tingling, focal weakness, weakness and headaches.  Endo/Heme/Allergies:  Does not bruise/bleed easily.  Psychiatric/Behavioral:  Negative for depression.  The patient is not nervous/anxious and does not have insomnia.     PAST MEDICAL HISTORY :  Past Medical History:  Diagnosis Date  . Atherosclerosis of coronary artery 09/28/2016   Chest CT Nov 2017  . Elevated C-reactive protein (CRP)   . Elevated PSA   . Fractured sternum 1999  . Herpes   . History of rib fracture 1999   multiple  . Hyperlipidemia   . Psoriasis   . Rheumatoid factor positive April 2015  . Rosacea   . Thoracic aortic atherosclerosis ([place]) 09/28/2016   Chest CT Nov 2017  . Tobacco abuse   . Vitamin D deficiency disease     PAST SURGICAL HISTORY :   Past Surgical History:  Procedure Laterality Date  . APPENDECTOMY  1974  . PORTA CATH INSERTION N/A 06/06/2021   Procedure: PORTA CATH INSERTION;  Surgeon: Algernon Huxley, MD;  Location: [facility];  Service: Cardiovascular;  Laterality: N/A;  . SHOULDER ARTHROSCOPY  10/23/2016   Procedure: ARTHROSCOPY SHOULDER WITH DISTAL CLAVICLE EXCISION, PARTIAL ACROMIONECTOMY, AND DEBRIDEMENT;  Surgeon: Earnestine Leys, MD;  Location: [facility];  Service: Orthopedics;;  . TIBIA FRACTURE SURGERY Left 2004   titanium rod, ORIF    FAMILY HISTORY :   Family History  Problem Relation Age of Onset  . Heart disease Father   . Osteoarthritis Father   . Alzheimer's disease Father   . Cancer Neg Hx   . Diabetes Neg Hx   . Hypertension Neg Hx   . Stroke Neg Hx   . COPD Neg Hx     SOCIAL HISTORY:   Social History   Tobacco Use  . Smoking status: Every Day    Packs/day: 0.50    Years: 45.00    Pack years: 22.50    Types: Cigarettes  . Smokeless tobacco: Never  . Tobacco comments:    11/6- currently smoking 5 cigarettes a day   Vaping Use  . Vaping Use: Never used  Substance Use Topics  . Alcohol use: Yes    Alcohol/week: 3.0 standard drinks    Types: 3 Glasses of wine per week    Comment: a couple glasses of wine a week  . Drug use: No    ALLERGIES:  is allergic to gluten meal.  MEDICATIONS:  Current  Outpatient Medications  Medication Sig Dispense Refill  . acetaminophen (TYLENOL) 325 MG tablet Take by mouth every 6 (six) hours as needed.    Marland Kitchen acyclovir (ZOVIRAX) 400 MG tablet Take 1 tablet (400 mg total) by mouth 2 (two) times daily. 60 tablet 4  . allopurinol (ZYLOPRIM) 300 MG tablet Take 1 tablet (300 mg total) by mouth 2 (two) times daily. 120 tablet 0  . diphenhydrAMINE (BENADRYL) 25 MG tablet Take 25 mg by mouth every 6 (six) hours as needed.    . lidocaine-prilocaine (EMLA) cream Apply 30 -45 mins prior to port access. 30 g 0  . montelukast (SINGULAIR) 10 MG tablet Take 1 tablet (10 mg total) by mouth at  bedtime. Start 2 days prior to infusion. Do not take on the day of infusion. 60 tablet 0  . venetoclax (VENCLEXTA) 100 MG tablet  (Patient not taking: No sig reported)     No current facility-administered medications for this visit.   Facility-Administered Medications Ordered in Other Visits  Medication Dose Route Frequency Provider Last Rate Last Admin  . heparin lock flush 100 unit/mL  500 Units Intravenous Once Cammie Sickle, MD        PHYSICAL EXAMINATION: ECOG PERFORMANCE STATUS: 0 - Asymptomatic  BP 115/74 (BP Location: Left Arm, Patient Position: Sitting, Cuff Size: Normal)   Pulse (!) 106   Temp (!) 96.8 F (36 C) (Tympanic)   Resp 16   Ht 5' 6"  (1.676 m)   Wt 161 lb 12.8 oz (73.4 kg)   SpO2 98%   BMI 26.12 kg/m   Filed Weights   06/29/21 0829  Weight: 161 lb 12.8 oz (73.4 kg)    Physical Exam Constitutional:      Comments: Alone.  Ambulating independently.  Bulky adenopathy noted left supraclavicular region [8cm]. ~7 cm lymph node felt in the left underarm; 5cm lymph node in the right underarm.  ~3 cm  lymph node felt in the right and left groin. [Clinically softer improving]  HENT:     Head: Normocephalic and atraumatic.     Mouth/Throat:     Pharynx: No oropharyngeal exudate.  Eyes:     Pupils: Pupils are equal, round, and reactive to light.   Cardiovascular:     Rate and Rhythm: Normal rate and regular rhythm.  Pulmonary:     Effort: No respiratory distress.     Breath sounds: No wheezing.  Abdominal:     General: Bowel sounds are normal. There is no distension.     Palpations: Abdomen is soft. There is no mass.     Tenderness: There is no abdominal tenderness. There is no guarding or rebound.  Musculoskeletal:        General: No tenderness. Normal range of motion.     Cervical back: Normal range of motion and neck supple.  Skin:    General: Skin is warm.  Neurological:     Mental Status: He is alert and oriented to person, place, and time.  Psychiatric:        Mood and Affect: Affect normal.    LABORATORY DATA:  I have reviewed the data as listed    Component Value Date/Time   NA 133 (L) 06/29/2021 0805   K 4.0 06/29/2021 0805   CL 102 06/29/2021 0805   CO2 24 06/29/2021 0805   GLUCOSE 126 (H) 06/29/2021 0805   BUN 13 06/29/2021 0805   CREATININE 0.90 06/29/2021 0805   CREATININE 0.84 10/06/2019 0000   CALCIUM 8.9 06/29/2021 0805   PROT 6.4 (L) 06/29/2021 0805   ALBUMIN 3.8 06/29/2021 0805   AST 22 06/29/2021 0805   ALT 14 06/29/2021 0805   ALKPHOS 73 06/29/2021 0805   BILITOT 0.8 06/29/2021 0805   GFRNONAA >60 06/29/2021 0805   GFRNONAA 91 10/06/2019 0000   GFRAA >60 12/26/2019 0842   GFRAA 106 10/06/2019 0000    No results found for: SPEP, UPEP  Lab Results  Component Value Date   WBC 5.4 06/29/2021   NEUTROABS 4.3 06/29/2021   HGB 14.9 06/29/2021   HCT 42.6 06/29/2021   MCV 88.8 06/29/2021   PLT 151 06/29/2021      Chemistry      Component Value Date/Time  NA 133 (L) 06/29/2021 0805   K 4.0 06/29/2021 0805   CL 102 06/29/2021 0805   CO2 24 06/29/2021 0805   BUN 13 06/29/2021 0805   CREATININE 0.90 06/29/2021 0805   CREATININE 0.84 10/06/2019 0000      Component Value Date/Time   CALCIUM 8.9 06/29/2021 0805   ALKPHOS 73 06/29/2021 0805   AST 22 06/29/2021 0805   ALT 14  06/29/2021 0805   BILITOT 0.8 06/29/2021 0805       RADIOGRAPHIC STUDIES: I have personally reviewed the radiological images as listed and agreed with the findings in the report. No results found.   ASSESSMENT & PLAN:   .CLL (chronic lymphocytic leukemia) ([place]) #SLL/CLL- PET scan- June 2022- Marked progression of lymphoproliferative process -Axillary /neck retroperitoneal adenopathy.  Currently on Gazyva 6 cycles+ venetoclax [plan to add a second #3] 12 months therapy.   # Currently s/p  cycle #1- day-8. Patient is well premedicated with Tylenol/Singulair/Benadryl.  # Proceed with cycle number 1 day 15 of Gazyva today again. Labs today reviewed;  acceptable for treatment today.   #Recommend shingles/TLS prophylaxis: Acyclovir/allopurinol- STABLE.   # EVUSHLED: s/p  COVID EVUSHELD PROPHYLAXIS  #Mediport placement: No malfunction noted  # DISPOSITION: # infusion today.  # Follow up in 2 week [wed].  MD; labs- cbc/cmp; uric acid;mag' phos LDH- Gazyva- Dr.B           No orders of the defined types were placed in this encounter.        Cammie Sickle, MD 06/29/2021 9:00 AM

## 2021-06-29 NOTE — Progress Notes (Signed)
Pt tolerated 06/22/21 infusion without complications. Clarified plan with [facility] and Dr. Rogue Bussing and Will Start Dyann Kief at 157m/hr and increase in 1055mhr increments every 30 minutes with max rate 40037mr.

## 2021-06-29 NOTE — Assessment & Plan Note (Addendum)
#  SLL/CLL- PET scan- June 2022- Marked progression of lymphoproliferative process -Axillary /neck retroperitoneal adenopathy.  Currently on Gazyva 6 cycles+ venetoclax [plan to add a second #3] 12 months therapy.   # Currently s/p  cycle #1- day-8. Patient is well premedicated with Tylenol/Singulair/Benadryl.  # Proceed with cycle number 1 day 15 of Gazyva today again. Labs today reviewed;  acceptable for treatment today.   #Recommend shingles/TLS prophylaxis: Acyclovir/allopurinol- STABLE.   # EVUSHLED: s/p  COVID EVUSHELD PROPHYLAXIS  #Mediport placement: No malfunction noted  # DISPOSITION: # infusion today.  # Follow up in 2 week [wed].  MD; labs- cbc/cmp; uric acid;mag' phos LDH- Gazyva- Dr.B

## 2021-06-29 NOTE — Patient Instructions (Signed)
[facility] ONCOLOGY  Discharge Instructions: Thank you for choosing [facility] to provide your oncology and hematology care.  If you have a lab appointment with the [facility], please go directly to the [facility] and check in at the registration area.  Wear comfortable clothing and clothing appropriate for easy access to any Portacath or PICC line.   We strive to give you quality time with your provider. You may need to reschedule your appointment if you arrive late (15 or more minutes).  Arriving late affects you and other patients whose appointments are after yours.  Also, if you miss three or more appointments without notifying the office, you may be dismissed from the clinic at the provider's discretion.      For prescription refill requests, have your pharmacy contact our office and allow 72 hours for refills to be completed.    Today you received the following chemotherapy and/or immunotherapy agents Gazyva      To help prevent nausea and vomiting after your treatment, we encourage you to take your nausea medication as directed.  BELOW ARE SYMPTOMS THAT SHOULD BE REPORTED IMMEDIATELY: *FEVER GREATER THAN 100.4 F (38 C) OR HIGHER *CHILLS OR SWEATING *NAUSEA AND VOMITING THAT IS NOT CONTROLLED WITH YOUR NAUSEA MEDICATION *UNUSUAL SHORTNESS OF BREATH *UNUSUAL BRUISING OR BLEEDING *URINARY PROBLEMS (pain or burning when urinating, or frequent urination) *BOWEL PROBLEMS (unusual diarrhea, constipation, pain near the anus) TENDERNESS IN MOUTH AND THROAT WITH OR WITHOUT PRESENCE OF ULCERS (sore throat, sores in mouth, or a toothache) UNUSUAL RASH, SWELLING OR PAIN  UNUSUAL VAGINAL DISCHARGE OR ITCHING   Items with * indicate a potential emergency and should be followed up as soon as possible or go to the Emergency Department if any problems should occur.  Please show the CHEMOTHERAPY ALERT CARD or IMMUNOTHERAPY ALERT CARD at check-in to  the Emergency Department and triage nurse.  Should you have questions after your visit or need to cancel or reschedule your appointment, please contact [facility]  [phone] and follow the prompts.  Office hours are 8:00 a.m. to 4:30 p.m. Monday - Friday. Please note that voicemails left after 4:00 p.m. may not be returned until the following business day.  We are closed weekends and major holidays. You have access to a nurse at all times for urgent questions. Please call the main number to the clinic [phone] and follow the prompts.  For any non-urgent questions, you may also contact your provider using MyChart. We now offer e-Visits for anyone 7 and older to request care online for non-urgent symptoms. For details visit [email or web address].   Also download the MyChart app! Go to the app store, search "MyChart", open the app, select [facility], and log in with your MyChart username and password.  Due to Covid, a mask is required upon entering the hospital/clinic. If you do not have a mask, one will be given to you upon arrival. For doctor visits, patients may have 1 support person aged 55 or older with them. For treatment visits, patients cannot have anyone with them due to current Covid guidelines and our immunocompromised population.

## 2021-07-13 ENCOUNTER — Inpatient Hospital Stay: Payer: Medicare PPO

## 2021-07-13 ENCOUNTER — Encounter: Payer: Self-pay | Admitting: Internal Medicine

## 2021-07-13 ENCOUNTER — Other Ambulatory Visit: Payer: Self-pay

## 2021-07-13 ENCOUNTER — Inpatient Hospital Stay (HOSPITAL_BASED_OUTPATIENT_CLINIC_OR_DEPARTMENT_OTHER): Payer: Medicare PPO | Admitting: Internal Medicine

## 2021-07-13 VITALS — BP 106/71 | HR 84 | Temp 97.0°F | Resp 20 | Ht 66.0 in | Wt 161.0 lb

## 2021-07-13 VITALS — BP 108/69 | HR 76 | Temp 96.0°F | Resp 18

## 2021-07-13 DIAGNOSIS — C9110 Chronic lymphocytic leukemia of B-cell type not having achieved remission: Secondary | ICD-10-CM | POA: Diagnosis not present

## 2021-07-13 DIAGNOSIS — Z7289 Other problems related to lifestyle: Secondary | ICD-10-CM | POA: Diagnosis not present

## 2021-07-13 DIAGNOSIS — Z5112 Encounter for antineoplastic immunotherapy: Secondary | ICD-10-CM | POA: Diagnosis not present

## 2021-07-13 DIAGNOSIS — Z8249 Family history of ischemic heart disease and other diseases of the circulatory system: Secondary | ICD-10-CM | POA: Diagnosis not present

## 2021-07-13 DIAGNOSIS — F17210 Nicotine dependence, cigarettes, uncomplicated: Secondary | ICD-10-CM | POA: Diagnosis not present

## 2021-07-13 DIAGNOSIS — R5383 Other fatigue: Secondary | ICD-10-CM | POA: Diagnosis not present

## 2021-07-13 DIAGNOSIS — Z9049 Acquired absence of other specified parts of digestive tract: Secondary | ICD-10-CM | POA: Diagnosis not present

## 2021-07-13 DIAGNOSIS — Z95828 Presence of other vascular implants and grafts: Secondary | ICD-10-CM

## 2021-07-13 DIAGNOSIS — Z79899 Other long term (current) drug therapy: Secondary | ICD-10-CM | POA: Diagnosis not present

## 2021-07-13 DIAGNOSIS — R590 Localized enlarged lymph nodes: Secondary | ICD-10-CM | POA: Diagnosis not present

## 2021-07-13 LAB — CBC WITH DIFFERENTIAL/PLATELET
Abs Immature Granulocytes: 0.03 10*3/uL (ref 0.00–0.07)
Basophils Absolute: 0.1 10*3/uL (ref 0.0–0.1)
Basophils Relative: 2 %
Eosinophils Absolute: 0.1 10*3/uL (ref 0.0–0.5)
Eosinophils Relative: 2 %
HCT: 41.6 % (ref 39.0–52.0)
Hemoglobin: 14.3 g/dL (ref 13.0–17.0)
Immature Granulocytes: 1 %
Lymphocytes Relative: 19 %
Lymphs Abs: 1 10*3/uL (ref 0.7–4.0)
MCH: 30.7 pg (ref 26.0–34.0)
MCHC: 34.4 g/dL (ref 30.0–36.0)
MCV: 89.3 fL (ref 80.0–100.0)
Monocytes Absolute: 0.3 10*3/uL (ref 0.1–1.0)
Monocytes Relative: 5 %
Neutro Abs: 3.7 10*3/uL (ref 1.7–7.7)
Neutrophils Relative %: 71 %
Platelets: 210 10*3/uL (ref 150–400)
RBC: 4.66 MIL/uL (ref 4.22–5.81)
RDW: 13.1 % (ref 11.5–15.5)
WBC: 5.2 10*3/uL (ref 4.0–10.5)
nRBC: 0 % (ref 0.0–0.2)

## 2021-07-13 LAB — COMPREHENSIVE METABOLIC PANEL
ALT: 13 U/L (ref 0–44)
AST: 17 U/L (ref 15–41)
Albumin: 3.8 g/dL (ref 3.5–5.0)
Alkaline Phosphatase: 66 U/L (ref 38–126)
Anion gap: 9 (ref 5–15)
BUN: 15 mg/dL (ref 8–23)
CO2: 25 mmol/L (ref 22–32)
Calcium: 8.9 mg/dL (ref 8.9–10.3)
Chloride: 103 mmol/L (ref 98–111)
Creatinine, Ser: 0.84 mg/dL (ref 0.61–1.24)
GFR, Estimated: >60 mL/min (ref >60)
Glucose, Bld: 109 mg/dL — ABNORMAL HIGH (ref 70–99)
Potassium: 4.1 mmol/L (ref 3.5–5.1)
Sodium: 137 mmol/L (ref 135–145)
Total Bilirubin: 0.6 mg/dL (ref 0.3–1.2)
Total Protein: 6.5 g/dL (ref 6.5–8.1)

## 2021-07-13 LAB — MAGNESIUM: Magnesium: 1.9 mg/dL (ref 1.7–2.4)

## 2021-07-13 LAB — PHOSPHORUS: Phosphorus: 3.3 mg/dL (ref 2.5–4.6)

## 2021-07-13 LAB — LACTATE DEHYDROGENASE: LDH: 238 U/L — ABNORMAL HIGH (ref 98–192)

## 2021-07-13 LAB — URIC ACID: Uric Acid, Serum: 2.6 mg/dL — ABNORMAL LOW (ref 3.7–8.6)

## 2021-07-13 MED ORDER — SODIUM CHLORIDE 0.9 % IV SOLN
1000.0000 mg | Freq: Once | INTRAVENOUS | Status: AC
  Administered 2021-07-13: 1000 mg via INTRAVENOUS
  Filled 2021-07-13: qty 40

## 2021-07-13 MED ORDER — DIPHENHYDRAMINE HCL 50 MG/ML IJ SOLN
50.0000 mg | Freq: Once | INTRAMUSCULAR | Status: AC
  Administered 2021-07-13: 50 mg via INTRAVENOUS
  Filled 2021-07-13: qty 1

## 2021-07-13 MED ORDER — ACETAMINOPHEN 325 MG PO TABS
650.0000 mg | ORAL_TABLET | Freq: Once | ORAL | Status: AC
  Administered 2021-07-13: 650 mg via ORAL
  Filled 2021-07-13: qty 2

## 2021-07-13 MED ORDER — SODIUM CHLORIDE 0.9% FLUSH
10.0000 mL | Freq: Once | INTRAVENOUS | Status: AC
  Administered 2021-07-13: 10 mL via INTRAVENOUS
  Filled 2021-07-13: qty 10

## 2021-07-13 MED ORDER — HEPARIN SOD (PORK) LOCK FLUSH 100 UNIT/ML IV SOLN
500.0000 [IU] | Freq: Once | INTRAVENOUS | Status: DC
  Filled 2021-07-13: qty 5

## 2021-07-13 MED ORDER — SODIUM CHLORIDE 0.9 % IV SOLN
Freq: Once | INTRAVENOUS | Status: AC
  Filled 2021-07-13: qty 250

## 2021-07-13 MED ORDER — HEPARIN SOD (PORK) LOCK FLUSH 100 UNIT/ML IV SOLN
500.0000 [IU] | Freq: Once | INTRAVENOUS | Status: AC | PRN
  Administered 2021-07-13: 500 [IU]
  Filled 2021-07-13: qty 5

## 2021-07-13 MED ORDER — SODIUM CHLORIDE 0.9 % IV SOLN
20.0000 mg | Freq: Once | INTRAVENOUS | Status: AC
  Administered 2021-07-13: 20 mg via INTRAVENOUS
  Filled 2021-07-13: qty 20

## 2021-07-13 MED ORDER — HEPARIN SOD (PORK) LOCK FLUSH 100 UNIT/ML IV SOLN
INTRAVENOUS | Status: AC
  Filled 2021-07-13: qty 5

## 2021-07-13 NOTE — Assessment & Plan Note (Addendum)
#  SLL/CLL- PET scan- June 2022- Marked progression of lymphoproliferative process -Axillary /neck retroperitoneal adenopathy.  Currently on Gazyva 6 cycles+ venetoclax [plan to add with cycle#3] 12 months therapy.  Currently on Gazyva ONLY.   # Proceed with cycle #2 of Gazyva today again. Labs today reviewed;  acceptable for treatment today. Will plan to get CT scan today prior to #3.  Partial response noted given the significant/bulky adenopathy-discussed the possible need for admission to the hospital for venetoclax therapy at next admission.  # Continue  shingles/TLS prophylaxis: Acyclovir/allopurinol- STABLE.   # EVUSHLED: s/p  COVID EVUSHELD PROPHYLAXIS  #Mediport placement: No malfunction noted- STABLE.   # DISPOSITION: # infusion today.  # Follow up in 4 week [wed].  MD; labs- cbc/cmp; uric acid;mag' phos LDH- Gazyva-CT N/C/A/P prior- Dr.B

## 2021-07-13 NOTE — Patient Instructions (Signed)
[facility] ONCOLOGY  Discharge Instructions: Thank you for choosing [facility] to provide your oncology and hematology care.  If you have a lab appointment with the [facility], please go directly to the [facility] and check in at the registration area.  Wear comfortable clothing and clothing appropriate for easy access to any Portacath or PICC line.   We strive to give you quality time with your provider. You may need to reschedule your appointment if you arrive late (15 or more minutes).  Arriving late affects you and other patients whose appointments are after yours.  Also, if you miss three or more appointments without notifying the office, you may be dismissed from the clinic at the provider's discretion.      For prescription refill requests, have your pharmacy contact our office and allow 72 hours for refills to be completed.    Today you received the following chemotherapy and/or immunotherapy agents: Gazyva    To help prevent nausea and vomiting after your treatment, we encourage you to take your nausea medication as directed.  BELOW ARE SYMPTOMS THAT SHOULD BE REPORTED IMMEDIATELY: *FEVER GREATER THAN 100.4 F (38 C) OR HIGHER *CHILLS OR SWEATING *NAUSEA AND VOMITING THAT IS NOT CONTROLLED WITH YOUR NAUSEA MEDICATION *UNUSUAL SHORTNESS OF BREATH *UNUSUAL BRUISING OR BLEEDING *URINARY PROBLEMS (pain or burning when urinating, or frequent urination) *BOWEL PROBLEMS (unusual diarrhea, constipation, pain near the anus) TENDERNESS IN MOUTH AND THROAT WITH OR WITHOUT PRESENCE OF ULCERS (sore throat, sores in mouth, or a toothache) UNUSUAL RASH, SWELLING OR PAIN  UNUSUAL VAGINAL DISCHARGE OR ITCHING   Items with * indicate a potential emergency and should be followed up as soon as possible or go to the Emergency Department if any problems should occur.  Please show the CHEMOTHERAPY ALERT CARD or IMMUNOTHERAPY ALERT CARD at check-in to  the Emergency Department and triage nurse.  Should you have questions after your visit or need to cancel or reschedule your appointment, please contact [facility]  [phone] and follow the prompts.  Office hours are 8:00 a.m. to 4:30 p.m. Monday - Friday. Please note that voicemails left after 4:00 p.m. may not be returned until the following business day.  We are closed weekends and major holidays. You have access to a nurse at all times for urgent questions. Please call the main number to the clinic [phone] and follow the prompts.  For any non-urgent questions, you may also contact your provider using MyChart. We now offer e-Visits for anyone 52 and older to request care online for non-urgent symptoms. For details visit [email or web address].   Also download the MyChart app! Go to the app store, search "MyChart", open the app, select [facility], and log in with your MyChart username and password.  Due to Covid, a mask is required upon entering the hospital/clinic. If you do not have a mask, one will be given to you upon arrival. For doctor visits, patients may have 1 support person aged 35 or older with them. For treatment visits, patients cannot have anyone with them due to current Covid guidelines and our immunocompromised population.

## 2021-07-14 ENCOUNTER — Encounter: Payer: Self-pay | Admitting: Internal Medicine

## 2021-07-14 NOTE — Progress Notes (Signed)
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   AUG 2021-Head and neck: The brain demonstrates symmetric appearing diffuse physiologic uptake without focal finding to suggest abnormality. In the left posterior and medial maxillary cavity, there is an area of mucoid deposition seen within SUV max of 28.8. This was seen in the previous study of November. The specific bony erosion, clear-cut soft tissue lesion is not detected. This probably represents a chronic sinus disease focus. The bilateral optic nerves and extraocular muscles to the largest nodes are seen on axial image 98. 1 now demonstrates a short axis measurement of 3.3 cm and the other more medially, a short axis measurement of 2.5 cm. Demonstrate symmetric increased uptake. The left, much greater than right, jugulodigastric and supraclavicular nodes have increased significantly in size and avidity since the previous study. Both of these lymph nodes demonstrate an SUV max of 3.9. No largest lymph node in the right subclavicular space is seen on axial image 104, demonstrating an SUV max of 3.3, and a short axis measurement of 1.4 cm.   On axial image 102, a right paratracheal lymph node is again identified with increased avidity, located immediately posterior to the right thyroid pole. This area was noted previously, but now increased avidity is seen, with an SUV  max of 8.6 in this area. Multiple smaller, and less avid lymph nodes scattered throughout the left greater than right neck are seen, as well as supraclavicular and infraclavicular areas.   Thorax: Significant axillary lymph nodes have increased significantly in size. These are also left greater than right, with the largest on the left seen on image #114 with a short axis measurement of 2.2 cm and an SUV max of 4.3. The largest on the right as seen on image 111, demonstrating a short axis of 1.4 cm and an SUV max of 4.0. Too numerous to count smaller and similarly avid lymph nodes in both axillae are also noted. These are all larger than previously seen and in general, more avid.  Numerous small AP window and lesser mediastinal lymph nodes are seen with minimal increased avidity, appearing similar in size and avidity to the previous study. No other significant mediastinal adenopathy. No hilar adenopathy detected. These likely also represent lymphoproliferative disease.  Hyperinflation of COPD, bibasilar dependent atelectasis, some paucity of lung tissue and biapical scarring are noted without other focal pulmonary mass or nodule, or acute pulmonary parenchymal disease. Physiologic uptake in the left heart is seen, as expected. Small hiatal hernia is again identified.   Abdomen pelvis: Physiologic uptake in the liver, gut and urinary tract is again noted. No focal area of asymmetric uptake in the structures is seen. The spleen also demonstrates generalized increased uptake and stable splenomegaly, up to 14.2 cm. On image 198, a preaortic lymph node is detected of increased size since the previous study, now measuring up to 1.2 cm short axis.  Several large lymph nodes on image 203 of the mesentery are  noted. The largest of these, on image 201 and ureters up to 1.8 cm short axis. Despite the fact that measurable avidity is not detected in these are the surrounding lymph nodes, these are doubtless involved in the  lymphoproliferative disease described, and appear larger. Multiple smaller lymph nodes scattered throughout the mesentery are also seen.  Grossly enlarged right greater than left common iliac chain lymph nodes are again seen, throughout the pelvis and into the inguinal areas bilaterally. The most avid on the right is seen on image #260 to, demonstrates a short axis of 2.5 cm, and an SUV max of 4.9. The largest on the left is identified image #272, demonstrates a short axis of 2.0 cm, and an SUV max of 4.7. These are similar to the previously noted values.  Right greater than left inguinal adenopathy is seen, with the largest lymph node seen on the right image 276, demonstrating a short axis of 1.5 cm and an SUV max of 3.9. This is larger in size and avidity than previously seen. There are bilateral enlarged lymph nodes associated with the external iliac vessels on image 277. These measure 2.0 cm on the right in short axis, with an SUV max of 3.9.  On the left, image 278, the external iliac lymph node is larger at 1.8 cm short axis, but with an SUV max of only 3.8. Lesser avid and smaller lymph nodes on both sides are also detected.   Musculoskeletal: Scattered, insignificant uptake is detected in the muscles especially in the bilateral joints, without focal finding to suggest lymphoproliferative disease.   IMPRESSIONS:  Significant increase in numbers, short axis size, and to a large extent, avidity -in the lymph nodes of the left greater than right neck, supraclavicular and infraclavicular areas, bilateral axillary lymph nodes and common iliac chain, external iliac chain and inguinal lymph nodes, consistent with worsening lymphoproliferative disease. The mediastinal lymph nodes seen are still present and unchanged in size or [significantly] in avidity.    [place]- Bx- SLL/CLL.July 2022-Discussed with Dr. Asquith-oncologist, [place]-reviewed the pathology of the lymph node biopsy in 2021-positive for SLL;  negative for cyclin D1/1114 translocation.    # July 20th, 2022- GAZYVA #1 Aletha Halim plan ading VENATOCLAX after 2 cycles/de-bulking]  # Lung cancer screening- on [facility]  # SURVIVORSHIP:   # GENETICS:   DIAGNOSIS:   STAGE:         ;  GOALS:  CURRENT/MOST RECENT THERAPY :       CLL (chronic lymphocytic leukemia) ([place])  10/29/2019 Initial Diagnosis   CLL (chronic lymphocytic leukemia) ([place])   06/15/2021 -  Chemotherapy    Patient is on Treatment Plan: LYMPHOMA CLL/SLL VENETOCLAX + OBINUTUZUMAB Q28D          INTERVAL HISTORY:  A pleasant 70 year-old male patient with CLL/SLL -stage IV bulky adenopathy-currently on Gazyva [plan to add venetoclax with cycle #3]-is here for follow-up; currently s/p cycle #1.  Patient noticed to have improvement of his neck adenopathy/underarm lymph nodes.  However they still have bulky.  No nausea no vomiting.  No further reactions with Gazyva.  Review of Systems  Constitutional:  Positive for malaise/fatigue. Negative for chills, diaphoresis, fever and weight loss.  HENT:  Negative for nosebleeds and sore throat.   Eyes:  Negative for double vision.  Respiratory:  Negative for cough, hemoptysis, sputum production, shortness of breath and wheezing.   Cardiovascular:  Negative for chest pain, palpitations, orthopnea and leg swelling.  Gastrointestinal:  Negative for abdominal pain, blood in  stool, constipation, diarrhea, heartburn, melena, nausea and vomiting.  Genitourinary:  Negative for dysuria, frequency and urgency.  Musculoskeletal:  Negative for back pain and joint pain.  Skin: Negative.  Negative for itching and rash.  Neurological:  Negative for dizziness, tingling, focal weakness, weakness and headaches.  Endo/Heme/Allergies:  Does not bruise/bleed easily.  Psychiatric/Behavioral:  Negative for depression. The patient is not nervous/anxious and does not have insomnia.     PAST MEDICAL HISTORY :  Past Medical History:  Diagnosis  Date   Atherosclerosis of coronary artery 09/28/2016   Chest CT Nov 2017   Elevated C-reactive protein (CRP)    Elevated PSA    Fractured sternum 1999   Herpes    History of rib fracture 1999   multiple   Hyperlipidemia    Psoriasis    Rheumatoid factor positive April 2015   Rosacea    Thoracic aortic atherosclerosis ([place]) 09/28/2016   Chest CT Nov 2017   Tobacco abuse    Vitamin D deficiency disease     PAST SURGICAL HISTORY :   Past Surgical History:  Procedure Laterality Date   APPENDECTOMY  1974   PORTA CATH INSERTION N/A 06/06/2021   Procedure: PORTA CATH INSERTION;  Surgeon: Algernon Huxley, MD;  Location: [facility];  Service: Cardiovascular;  Laterality: N/A;   SHOULDER ARTHROSCOPY  10/23/2016   Procedure: ARTHROSCOPY SHOULDER WITH DISTAL CLAVICLE EXCISION, PARTIAL ACROMIONECTOMY, AND DEBRIDEMENT;  Surgeon: Earnestine Leys, MD;  Location: [facility];  Service: Orthopedics;;   TIBIA FRACTURE SURGERY Left 2004   titanium rod, ORIF    FAMILY HISTORY :   Family History  Problem Relation Age of Onset   Heart disease Father    Osteoarthritis Father    Alzheimer's disease Father    Cancer Neg Hx    Diabetes Neg Hx    Hypertension Neg Hx    Stroke Neg Hx    COPD Neg Hx     SOCIAL HISTORY:   Social History   Tobacco Use   Smoking status: Every Day    Packs/day: 0.50    Years: 45.00    Pack years: 22.50    Types: Cigarettes   Smokeless tobacco: Never   Tobacco comments:    11/6- currently smoking 5 cigarettes a day   Vaping Use   Vaping Use: Never used  Substance Use Topics   Alcohol use: Yes    Alcohol/week: 3.0 standard drinks    Types: 3 Glasses of wine per week    Comment: a couple glasses of wine a week   Drug use: No    ALLERGIES:  is allergic to gluten meal.  MEDICATIONS:  Current Outpatient Medications  Medication Sig Dispense Refill   acetaminophen (TYLENOL) 325 MG tablet Take by mouth every 6 (six) hours as needed.     acyclovir  (ZOVIRAX) 400 MG tablet Take 1 tablet (400 mg total) by mouth 2 (two) times daily. 60 tablet 4   allopurinol (ZYLOPRIM) 300 MG tablet Take 1 tablet (300 mg total) by mouth 2 (two) times daily. 120 tablet 0   diphenhydrAMINE (BENADRYL) 25 MG tablet Take 25 mg by mouth every 6 (six) hours as needed.     lidocaine-prilocaine (EMLA) cream Apply 30 -45 mins prior to port access. 30 g 0   montelukast (SINGULAIR) 10 MG tablet Take 1 tablet (10 mg total) by mouth at bedtime. Start 2 days prior to infusion. Do not take on the day of infusion. 60 tablet 0  venetoclax (VENCLEXTA) 100 MG tablet  (Patient not taking: No sig reported)     No current facility-administered medications for this visit.    PHYSICAL EXAMINATION: ECOG PERFORMANCE STATUS: 0 - Asymptomatic  BP 106/71   Pulse 84   Temp (!) 97 F (36.1 C) (Tympanic)   Resp 20   Ht 5' 6"  (1.676 m)   Wt 161 lb (73 kg)   SpO2 99%   BMI 25.99 kg/m   Filed Weights   07/13/21 0832  Weight: 161 lb (73 kg)    Physical Exam Constitutional:      Comments: Alone.  Ambulating independently.  Bulky adenopathy noted left supraclavicular region [8cm]. ~7 cm lymph node felt in the left underarm; 5cm lymph node in the right underarm.  ~3 cm  lymph node felt in the right and left groin. [Clinically softer improving]  HENT:     Head: Normocephalic and atraumatic.     Mouth/Throat:     Pharynx: No oropharyngeal exudate.  Eyes:     Pupils: Pupils are equal, round, and reactive to light.  Cardiovascular:     Rate and Rhythm: Normal rate and regular rhythm.  Pulmonary:     Effort: No respiratory distress.     Breath sounds: No wheezing.  Abdominal:     General: Bowel sounds are normal. There is no distension.     Palpations: Abdomen is soft. There is no mass.     Tenderness: There is no abdominal tenderness. There is no guarding or rebound.  Musculoskeletal:        General: No tenderness. Normal range of motion.     Cervical back: Normal range of  motion and neck supple.  Skin:    General: Skin is warm.  Neurological:     Mental Status: He is alert and oriented to person, place, and time.  Psychiatric:        Mood and Affect: Affect normal.    LABORATORY DATA:  I have reviewed the data as listed    Component Value Date/Time   NA 137 07/13/2021 0812   K 4.1 07/13/2021 0812   CL 103 07/13/2021 0812   CO2 25 07/13/2021 0812   GLUCOSE 109 (H) 07/13/2021 0812   BUN 15 07/13/2021 0812   CREATININE 0.84 07/13/2021 0812   CREATININE 0.84 10/06/2019 0000   CALCIUM 8.9 07/13/2021 0812   PROT 6.5 07/13/2021 0812   ALBUMIN 3.8 07/13/2021 0812   AST 17 07/13/2021 0812   ALT 13 07/13/2021 0812   ALKPHOS 66 07/13/2021 0812   BILITOT 0.6 07/13/2021 0812   GFRNONAA >60 07/13/2021 0812   GFRNONAA 91 10/06/2019 0000   GFRAA >60 12/26/2019 0842   GFRAA 106 10/06/2019 0000    No results found for: SPEP, UPEP  Lab Results  Component Value Date   WBC 5.2 07/13/2021   NEUTROABS 3.7 07/13/2021   HGB 14.3 07/13/2021   HCT 41.6 07/13/2021   MCV 89.3 07/13/2021   PLT 210 07/13/2021      Chemistry      Component Value Date/Time   NA 137 07/13/2021 0812   K 4.1 07/13/2021 0812   CL 103 07/13/2021 0812   CO2 25 07/13/2021 0812   BUN 15 07/13/2021 0812   CREATININE 0.84 07/13/2021 0812   CREATININE 0.84 10/06/2019 0000      Component Value Date/Time   CALCIUM 8.9 07/13/2021 0812   ALKPHOS 66 07/13/2021 0812   AST 17 07/13/2021 0812   ALT 13 07/13/2021 9622  BILITOT 0.6 07/13/2021 2863       RADIOGRAPHIC STUDIES: I have personally reviewed the radiological images as listed and agreed with the findings in the report. No results found.   ASSESSMENT & PLAN:   .CLL (chronic lymphocytic leukemia) ([place]) #SLL/CLL- PET scan- June 2022- Marked progression of lymphoproliferative process -Axillary /neck retroperitoneal adenopathy.  Currently on Gazyva 6 cycles+ venetoclax [plan to add with cycle#3] 12 months therapy.  Currently  on Gazyva ONLY.   # Proceed with cycle #2 of Gazyva today again. Labs today reviewed;  acceptable for treatment today. Will plan to get CT scan today prior to #3.  Partial response noted given the significant/bulky adenopathy-discussed the possible need for admission to the hospital for venetoclax therapy at next admission.  # Continue  shingles/TLS prophylaxis: Acyclovir/allopurinol- STABLE.   # EVUSHLED: s/p  COVID EVUSHELD PROPHYLAXIS  #Mediport placement: No malfunction noted- STABLE.   # DISPOSITION: # infusion today.  # Follow up in 4 week [wed].  MD; labs- cbc/cmp; uric acid;mag' phos LDH- Gazyva-CT N/C/A/P prior- Dr.B         Orders Placed This Encounter  Procedures   CT SOFT TISSUE NECK W CONTRAST    Standing Status:   Future    Standing Expiration Date:   07/13/2022    Order Specific Question:   If indicated for the ordered procedure, I authorize the administration of contrast media per Radiology protocol    Answer:   Yes    Order Specific Question:   Preferred imaging location?    Answer:   [facility]   CT CHEST ABDOMEN PELVIS W CONTRAST    Standing Status:   Future    Standing Expiration Date:   07/13/2022    Order Specific Question:   Preferred imaging location?    Answer:   [facility]    Order Specific Question:   Radiology Contrast Protocol - do NOT remove file path    Answer:   [email or web address]   CBC with Differential    Standing Status:   Future    Standing Expiration Date:   07/13/2022   Comprehensive metabolic panel    Standing Status:   Future    Standing Expiration Date:   07/13/2022   Magnesium    Standing Status:   Future    Standing Expiration Date:   07/13/2022   Phosphorus    Standing Status:   Future    Standing Expiration Date:   07/13/2022   Uric acid    Standing Status:   Future    Standing Expiration Date:   07/13/2022   Lactate dehydrogenase    Standing Status:   Future    Standing  Expiration Date:   07/13/2022          Cammie Sickle, MD 07/14/2021 8:16 AM

## 2021-07-20 ENCOUNTER — Ambulatory Visit: Payer: TRICARE For Life (TFL) | Admitting: Internal Medicine

## 2021-07-20 ENCOUNTER — Ambulatory Visit: Payer: TRICARE For Life (TFL)

## 2021-07-20 ENCOUNTER — Other Ambulatory Visit: Payer: TRICARE For Life (TFL)

## 2021-07-21 ENCOUNTER — Encounter: Payer: Self-pay | Admitting: Internal Medicine

## 2021-07-21 MED ORDER — ALLOPURINOL 300 MG PO TABS: 300.0000 mg | ORAL_TABLET | Freq: Two times a day (BID) | ORAL | 3 refills | Status: DC

## 2021-08-03 ENCOUNTER — Telehealth: Payer: Self-pay | Admitting: Pharmacist

## 2021-08-03 DIAGNOSIS — C9110 Chronic lymphocytic leukemia of B-cell type not having achieved remission: Secondary | ICD-10-CM

## 2021-08-03 MED ORDER — VENETOCLAX 10 & 50 & 100 MG PO TBPK
ORAL_TABLET | ORAL | 0 refills | Status: DC
  Filled 2021-08-03: qty 42, fill #0
  Filled 2021-08-05: qty 42, 28d supply, fill #0

## 2021-08-03 NOTE — Telephone Encounter (Signed)
Oral Oncology Pharmacist Encounter  Received new prescription for Venclexta (venetoclax) for the treatment of CLL in conjunction with obinutuzumab, planned duration until disease control (~2years) or unacceptable drug toxicity.  CMP, uric acid, Phos, and LDH from 07/13/21 assessed, no relevant lab abnormalities. Mr. Rennels will be admitted for week 1 and 2 venetoclax initiation for TLS monitoring.   Prescription dose and frequency assessed.   Current medication list in Epic reviewed, no DDIs with venetoclax identified.  Evaluated chart and no patient barriers to medication adherence identified.   Prescription has been e-scribed to the [facility] for benefits analysis and approval.  Oral Oncology Clinic will continue to follow for insurance authorization, copayment issues, initial counseling and start date.   Darl Pikes, PharmD, BCPS, BCOP, CPP Hematology/Oncology Clinical Pharmacist Practitioner [facility] [phone]  08/03/2021 4:50 PM

## 2021-08-04 ENCOUNTER — Ambulatory Visit: Payer: TRICARE For Life (TFL)

## 2021-08-04 ENCOUNTER — Other Ambulatory Visit (HOSPITAL_COMMUNITY): Payer: Self-pay

## 2021-08-05 ENCOUNTER — Ambulatory Visit
Admission: RE | Admit: 2021-08-05 | Discharge: 2021-08-05 | Disposition: A | Discharge status: Home / Self Care | Payer: Medicare PPO | Source: Ambulatory Visit | Attending: Internal Medicine | Admitting: Internal Medicine

## 2021-08-05 ENCOUNTER — Other Ambulatory Visit (HOSPITAL_COMMUNITY): Payer: Self-pay

## 2021-08-05 ENCOUNTER — Other Ambulatory Visit: Payer: Self-pay

## 2021-08-05 DIAGNOSIS — C9110 Chronic lymphocytic leukemia of B-cell type not having achieved remission: Secondary | ICD-10-CM

## 2021-08-05 DIAGNOSIS — I2510 Atherosclerotic heart disease of native coronary artery without angina pectoris: Secondary | ICD-10-CM | POA: Diagnosis not present

## 2021-08-05 DIAGNOSIS — K5730 Diverticulosis of large intestine without perforation or abscess without bleeding: Secondary | ICD-10-CM | POA: Diagnosis not present

## 2021-08-05 DIAGNOSIS — I700 Atherosclerosis of aorta: Secondary | ICD-10-CM | POA: Diagnosis not present

## 2021-08-05 DIAGNOSIS — S2222XA Fracture of body of sternum, initial encounter for closed fracture: Secondary | ICD-10-CM | POA: Diagnosis not present

## 2021-08-05 DIAGNOSIS — C801 Malignant (primary) neoplasm, unspecified: Secondary | ICD-10-CM | POA: Diagnosis not present

## 2021-08-05 DIAGNOSIS — R590 Localized enlarged lymph nodes: Secondary | ICD-10-CM | POA: Diagnosis not present

## 2021-08-05 DIAGNOSIS — K449 Diaphragmatic hernia without obstruction or gangrene: Secondary | ICD-10-CM | POA: Diagnosis not present

## 2021-08-05 DIAGNOSIS — J351 Hypertrophy of tonsils: Secondary | ICD-10-CM | POA: Diagnosis not present

## 2021-08-05 MED ORDER — IOHEXOL 350 MG/ML SOLN
75.0000 mL | Freq: Once | INTRAVENOUS | Status: AC | PRN
  Administered 2021-08-05: 75 mL via INTRAVENOUS

## 2021-08-08 ENCOUNTER — Other Ambulatory Visit (HOSPITAL_COMMUNITY): Payer: Self-pay

## 2021-08-08 ENCOUNTER — Other Ambulatory Visit: Payer: Self-pay

## 2021-08-09 ENCOUNTER — Other Ambulatory Visit (HOSPITAL_COMMUNITY): Payer: Self-pay

## 2021-08-09 NOTE — Telephone Encounter (Signed)
Oral Oncology Patient Advocate Encounter  I spoke with Andres Mullins on 08/05/21 to set up first fill of Venclexta.  Venclexta was delivered to [facility] on 08/08/21.  [name] will call 7-10 days before next refill is due to complete adherence call and set up delivery of medication.     [place] [facility] [name] Phone [phone] Fax [phone] 08/09/2021 8:54 AM

## 2021-08-10 ENCOUNTER — Inpatient Hospital Stay: Payer: Medicare PPO | Attending: Internal Medicine

## 2021-08-10 ENCOUNTER — Inpatient Hospital Stay: Payer: Medicare PPO | Admitting: Pharmacist

## 2021-08-10 ENCOUNTER — Inpatient Hospital Stay (HOSPITAL_BASED_OUTPATIENT_CLINIC_OR_DEPARTMENT_OTHER): Payer: Medicare PPO | Admitting: Internal Medicine

## 2021-08-10 ENCOUNTER — Other Ambulatory Visit: Payer: Self-pay

## 2021-08-10 ENCOUNTER — Inpatient Hospital Stay: Payer: Medicare PPO

## 2021-08-10 VITALS — BP 116/73 | HR 88

## 2021-08-10 DIAGNOSIS — Z818 Family history of other mental and behavioral disorders: Secondary | ICD-10-CM | POA: Insufficient documentation

## 2021-08-10 DIAGNOSIS — Z79899 Other long term (current) drug therapy: Secondary | ICD-10-CM | POA: Diagnosis not present

## 2021-08-10 DIAGNOSIS — Z8249 Family history of ischemic heart disease and other diseases of the circulatory system: Secondary | ICD-10-CM | POA: Diagnosis not present

## 2021-08-10 DIAGNOSIS — F17210 Nicotine dependence, cigarettes, uncomplicated: Secondary | ICD-10-CM | POA: Diagnosis not present

## 2021-08-10 DIAGNOSIS — K449 Diaphragmatic hernia without obstruction or gangrene: Secondary | ICD-10-CM | POA: Insufficient documentation

## 2021-08-10 DIAGNOSIS — R590 Localized enlarged lymph nodes: Secondary | ICD-10-CM | POA: Insufficient documentation

## 2021-08-10 DIAGNOSIS — C9110 Chronic lymphocytic leukemia of B-cell type not having achieved remission: Secondary | ICD-10-CM

## 2021-08-10 DIAGNOSIS — Z8261 Family history of arthritis: Secondary | ICD-10-CM | POA: Diagnosis not present

## 2021-08-10 DIAGNOSIS — R5383 Other fatigue: Secondary | ICD-10-CM | POA: Insufficient documentation

## 2021-08-10 DIAGNOSIS — Z9049 Acquired absence of other specified parts of digestive tract: Secondary | ICD-10-CM | POA: Diagnosis not present

## 2021-08-10 LAB — CBC WITH DIFFERENTIAL/PLATELET
Abs Immature Granulocytes: 0.03 10*3/uL (ref 0.00–0.07)
Basophils Absolute: 0.1 10*3/uL (ref 0.0–0.1)
Basophils Relative: 2 %
Eosinophils Absolute: 0.2 10*3/uL (ref 0.0–0.5)
Eosinophils Relative: 3 %
HCT: 42.1 % (ref 39.0–52.0)
Hemoglobin: 14.6 g/dL (ref 13.0–17.0)
Immature Granulocytes: 1 %
Lymphocytes Relative: 15 %
Lymphs Abs: 1 10*3/uL (ref 0.7–4.0)
MCH: 30.6 pg (ref 26.0–34.0)
MCHC: 34.7 g/dL (ref 30.0–36.0)
MCV: 88.3 fL (ref 80.0–100.0)
Monocytes Absolute: 0.5 10*3/uL (ref 0.1–1.0)
Monocytes Relative: 7 %
Neutro Abs: 4.8 10*3/uL (ref 1.7–7.7)
Neutrophils Relative %: 72 %
Platelets: 238 10*3/uL (ref 150–400)
RBC: 4.77 MIL/uL (ref 4.22–5.81)
RDW: 13.8 % (ref 11.5–15.5)
WBC: 6.5 10*3/uL (ref 4.0–10.5)
nRBC: 0 % (ref 0.0–0.2)

## 2021-08-10 LAB — COMPREHENSIVE METABOLIC PANEL
ALT: 12 U/L (ref 0–44)
AST: 19 U/L (ref 15–41)
Albumin: 4 g/dL (ref 3.5–5.0)
Alkaline Phosphatase: 77 U/L (ref 38–126)
Anion gap: 7 (ref 5–15)
BUN: 11 mg/dL (ref 8–23)
CO2: 24 mmol/L (ref 22–32)
Calcium: 9.4 mg/dL (ref 8.9–10.3)
Chloride: 105 mmol/L (ref 98–111)
Creatinine, Ser: 0.9 mg/dL (ref 0.61–1.24)
GFR, Estimated: >60 mL/min (ref >60)
Glucose, Bld: 134 mg/dL — ABNORMAL HIGH (ref 70–99)
Potassium: 3.8 mmol/L (ref 3.5–5.1)
Sodium: 136 mmol/L (ref 135–145)
Total Bilirubin: 0.5 mg/dL (ref 0.3–1.2)
Total Protein: 6.7 g/dL (ref 6.5–8.1)

## 2021-08-10 LAB — URIC ACID: Uric Acid, Serum: 2.5 mg/dL — ABNORMAL LOW (ref 3.7–8.6)

## 2021-08-10 LAB — MAGNESIUM: Magnesium: 2.1 mg/dL (ref 1.7–2.4)

## 2021-08-10 LAB — LACTATE DEHYDROGENASE: LDH: 151 U/L (ref 98–192)

## 2021-08-10 LAB — PHOSPHORUS: Phosphorus: 4 mg/dL (ref 2.5–4.6)

## 2021-08-10 MED ORDER — SODIUM CHLORIDE 0.9% FLUSH
10.0000 mL | Freq: Once | INTRAVENOUS | Status: AC
  Administered 2021-08-10: 10 mL via INTRAVENOUS
  Filled 2021-08-10: qty 10

## 2021-08-10 MED ORDER — HEPARIN SOD (PORK) LOCK FLUSH 100 UNIT/ML IV SOLN
INTRAVENOUS | Status: AC
  Filled 2021-08-10: qty 5

## 2021-08-10 MED ORDER — HEPARIN SOD (PORK) LOCK FLUSH 100 UNIT/ML IV SOLN
500.0000 [IU] | Freq: Once | INTRAVENOUS | Status: DC | PRN
Start: 2021-08-10 — End: 2021-08-10
  Filled 2021-08-10: qty 5

## 2021-08-10 MED ORDER — ACETAMINOPHEN 325 MG PO TABS
650.0000 mg | ORAL_TABLET | Freq: Once | ORAL | Status: AC
  Administered 2021-08-10: 650 mg via ORAL
  Filled 2021-08-10: qty 2

## 2021-08-10 MED ORDER — HEPARIN SOD (PORK) LOCK FLUSH 100 UNIT/ML IV SOLN
500.0000 [IU] | Freq: Once | INTRAVENOUS | Status: AC
  Administered 2021-08-10: 500 [IU] via INTRAVENOUS
  Filled 2021-08-10: qty 5

## 2021-08-10 MED ORDER — SODIUM CHLORIDE 0.9 % IV SOLN
1000.0000 mg | Freq: Once | INTRAVENOUS | Status: AC
  Administered 2021-08-10: 1000 mg via INTRAVENOUS
  Filled 2021-08-10: qty 40

## 2021-08-10 MED ORDER — DIPHENHYDRAMINE HCL 50 MG/ML IJ SOLN
50.0000 mg | Freq: Once | INTRAMUSCULAR | Status: AC
Start: 2021-08-10 — End: 2021-08-10
  Administered 2021-08-10: 50 mg via INTRAVENOUS
  Filled 2021-08-10: qty 1

## 2021-08-10 MED ORDER — DEXAMETHASONE SODIUM PHOSPHATE 100 MG/10ML IJ SOLN
20.0000 mg | Freq: Once | INTRAMUSCULAR | Status: AC
  Administered 2021-08-10: 20 mg via INTRAVENOUS
  Filled 2021-08-10: qty 20

## 2021-08-10 MED ORDER — SODIUM CHLORIDE 0.9 % IV SOLN
Freq: Once | INTRAVENOUS | Status: AC
  Filled 2021-08-10: qty 250

## 2021-08-10 NOTE — Progress Notes (Signed)
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 Currently receiving obinutuzumab, with the plan to add on Venclexta (venetoclax).  Reason for Consult: Venetoclax oral chemotherapy education.   PAST MEDICAL HISTORY: Past Medical History:  Diagnosis Date   Atherosclerosis of coronary artery 09/28/2016   Chest CT Nov 2017   Elevated C-reactive protein (CRP)    Elevated PSA    Fractured sternum 1999   Herpes    History of rib fracture 1999   multiple   Hyperlipidemia    Psoriasis    Rheumatoid factor positive April 2015   Rosacea    Thoracic aortic atherosclerosis ([place]) 09/28/2016   Chest CT Nov 2017   Tobacco abuse    Vitamin D deficiency disease     HEMATOLOGY/ONCOLOGY HISTORY:  Oncology History Overview Note  # 2016- CLL-[flow] CD5 (+)/CD23 (+) clonal B-cell population, CLL/SLL phenotype, 18% of leukocytes, <5,000/uL, CD38 positive   # NOV 2020 PET [incidental]- Generalized Lymphadenopathy- left supraclav/ bil ax LN L> R; mesenteric LN; pelvic LN; FEB 5th 2021- 2.5cm left supraclav; Left ax LN- 2.4cm.   AUG 2021-Head and neck: The brain demonstrates symmetric appearing diffuse physiologic uptake without focal finding to suggest abnormality. In the left posterior and medial maxillary cavity, there is an area of mucoid deposition seen within SUV max of 28.8. This was seen in the previous study of November. The specific bony erosion, clear-cut soft tissue lesion is not detected. This probably  represents a chronic sinus disease focus. The bilateral optic nerves and extraocular muscles to the largest nodes are seen on axial image 98. 1 now demonstrates a short axis measurement of 3.3 cm and the other more medially, a short axis measurement of 2.5 cm. Demonstrate symmetric increased uptake. The left, much greater than right, jugulodigastric and supraclavicular nodes have increased significantly in size and avidity since the previous study. Both of these lymph nodes demonstrate an SUV max of 3.9. No largest lymph node in the right subclavicular space is seen on axial image 104, demonstrating an SUV max of 3.3, and a short axis measurement of 1.4 cm.   On axial image 102, a right paratracheal lymph node is again identified with increased avidity, located immediately posterior to the right thyroid pole. This area was noted previously, but now increased avidity is seen, with an SUV max of 8.6 in this area. Multiple smaller, and less avid lymph nodes scattered throughout the left greater than right neck are seen, as well as supraclavicular and infraclavicular areas.   Thorax: Significant axillary lymph nodes have increased significantly in size. These are also left greater than right, with the largest on the left seen on image #114 with a short axis measurement of 2.2 cm and an SUV max of 4.3. The largest on the right as seen on image 111, demonstrating a short axis of 1.4 cm and an SUV max of 4.0. Too numerous to count smaller and similarly avid lymph nodes in both axillae are also noted. These are all larger than previously seen and in general, more avid.  Numerous  small AP window and lesser mediastinal lymph nodes are seen with minimal increased avidity, appearing similar in size and avidity to the previous study. No other significant mediastinal adenopathy. No hilar adenopathy detected. These likely also represent lymphoproliferative disease.  Hyperinflation of COPD, bibasilar dependent atelectasis,  some paucity of lung tissue and biapical scarring are noted without other focal pulmonary mass or nodule, or acute pulmonary parenchymal disease. Physiologic uptake in the left heart is seen, as expected. Small hiatal hernia is again identified.   Abdomen pelvis: Physiologic uptake in the liver, gut and urinary tract is again noted. No focal area of asymmetric uptake in the structures is seen. The spleen also demonstrates generalized increased uptake and stable splenomegaly, up to 14.2 cm. On image 198, a preaortic lymph node is detected of increased size since the previous study, now measuring up to 1.2 cm short axis.  Several large lymph nodes on image 203 of the mesentery are noted. The largest of these, on image 201 and ureters up to 1.8 cm short axis. Despite the fact that measurable avidity is not detected in these are the surrounding lymph nodes, these are doubtless involved in the lymphoproliferative disease described, and appear larger. Multiple smaller lymph nodes scattered throughout the mesentery are also seen.  Grossly enlarged right greater than left common iliac chain lymph nodes are again seen, throughout the pelvis and into the inguinal areas bilaterally. The most avid on the right is seen on image #260 to, demonstrates a short axis of 2.5 cm, and an SUV max of 4.9. The largest on the left is identified image #272, demonstrates a short axis of 2.0 cm, and an SUV max of 4.7. These are similar to the previously noted values.  Right greater than left inguinal adenopathy is seen, with the largest lymph node seen on the right image 276, demonstrating a short axis of 1.5 cm and an SUV max of 3.9. This is larger in size and avidity than previously seen. There are bilateral enlarged lymph nodes associated with the external iliac vessels on image 277. These measure 2.0 cm on the right in short axis, with an SUV max of 3.9.  On the left, image 278, the external iliac lymph node is larger at 1.8 cm short  axis, but with an SUV max of only 3.8. Lesser avid and smaller lymph nodes on both sides are also detected.   Musculoskeletal: Scattered, insignificant uptake is detected in the muscles especially in the bilateral joints, without focal finding to suggest lymphoproliferative disease.   IMPRESSIONS:  Significant increase in numbers, short axis size, and to a large extent, avidity -in the lymph nodes of the left greater than right neck, supraclavicular and infraclavicular areas, bilateral axillary lymph nodes and common iliac chain, external iliac chain and inguinal lymph nodes, consistent with worsening lymphoproliferative disease. The mediastinal lymph nodes seen are still present and unchanged in size or [significantly] in avidity.    [place]- Bx- SLL/CLL.July 2022-Discussed with Dr. Asquith-oncologist, [place]-reviewed the pathology of the lymph node biopsy in 2021-positive for SLL; negative for cyclin D1/1114 translocation.    # July 20th, 2022- GAZYVA #1 Aletha Halim plan [place] after 2 cycles/de-bulking]  # Lung cancer screening- on [facility]  # SURVIVORSHIP:   # GENETICS:   DIAGNOSIS:   STAGE:         ;  GOALS:  CURRENT/MOST RECENT THERAPY :       CLL (chronic lymphocytic leukemia) ([place])  10/29/2019 Initial Diagnosis   CLL (chronic lymphocytic leukemia) (  [place])   06/15/2021 -  Chemotherapy    Patient is on Treatment Plan: LYMPHOMA CLL/SLL VENETOCLAX + OBINUTUZUMAB Q28D         ALLERGIES:  is allergic to gluten meal.  MEDICATIONS:  Current Outpatient Medications  Medication Sig Dispense Refill   acetaminophen (TYLENOL) 325 MG tablet Take by mouth every 6 (six) hours as needed.     acyclovir (ZOVIRAX) 400 MG tablet Take 1 tablet (400 mg total) by mouth 2 (two) times daily. 60 tablet 4   allopurinol (ZYLOPRIM) 300 MG tablet Take 1 tablet (300 mg total) by mouth 2 (two) times daily. 120 tablet 3   diphenhydrAMINE (BENADRYL) 25 MG tablet Take 25 mg by mouth every 6 (six) hours as  needed.     lidocaine-prilocaine (EMLA) cream Apply 30 -45 mins prior to port access. 30 g 0   montelukast (SINGULAIR) 10 MG tablet Take 1 tablet (10 mg total) by mouth at bedtime. Start 2 days prior to infusion. Do not take on the day of infusion. 60 tablet 0   venetoclax (VENCLEXTA) 10 & 50 & 100 MG Starter Pack Take by mouth daily. Take 20 mg for 7 days, then 50 mg daily x 7d, then 100 mg daily x 7d, then 200 mg daily x 7d. Take with food & water. (Patient not taking: Reported on 08/10/2021) 42 tablet 0   No current facility-administered medications for this visit.   Facility-Administered Medications Ordered in Other Visits  Medication Dose Route Frequency Provider Last Rate Last Admin   heparin lock flush 100 unit/mL  500 Units Intravenous Once Charlaine Dalton R, MD       heparin lock flush 100 unit/mL  500 Units Intracatheter Once PRN Cammie Sickle, MD        VITAL SIGNS: There were no vitals taken for this visit. There were no vitals filed for this visit.  Estimated body mass index is 25.99 kg/m as calculated from the following:   Height as of an earlier encounter on 08/10/21: 5' 6"  (1.676 m).   Weight as of 07/13/21: 73 kg (161 lb).  LABS: CBC:    Component Value Date/Time   WBC 6.5 08/10/2021 0812   HGB 14.6 08/10/2021 0812   HGB 14.5 08/17/2015 1135   HCT 42.1 08/10/2021 0812   HCT 43.1 08/17/2015 1135   PLT 238 08/10/2021 0812   PLT 240 08/17/2015 1135   MCV 88.3 08/10/2021 0812   MCV 89 08/17/2015 1135   NEUTROABS 4.8 08/10/2021 0812   NEUTROABS 4.0 08/17/2015 1135   LYMPHSABS 1.0 08/10/2021 0812   LYMPHSABS 4.6 (H) 08/17/2015 1135   MONOABS 0.5 08/10/2021 0812   EOSABS 0.2 08/10/2021 0812   EOSABS 0.2 08/17/2015 1135   BASOSABS 0.1 08/10/2021 0812   BASOSABS 0.0 08/17/2015 1135   Comprehensive Metabolic Panel:    Component Value Date/Time   NA 136 08/10/2021 0812   K 3.8 08/10/2021 0812   CL 105 08/10/2021 0812   CO2 24 08/10/2021 0812   BUN 11  08/10/2021 0812   CREATININE 0.90 08/10/2021 0812   CREATININE 0.84 10/06/2019 0000   GLUCOSE 134 (H) 08/10/2021 0812   CALCIUM 9.4 08/10/2021 0812   AST 19 08/10/2021 0812   ALT 12 08/10/2021 0812   ALKPHOS 77 08/10/2021 0812   BILITOT 0.5 08/10/2021 0812   PROT 6.7 08/10/2021 0812   ALBUMIN 4.0 08/10/2021 0812     Present during today's visit: patient and his wife  Assessment and Plan: Start plan: Mr.  Dines will start his venetoclax along with his next cycle of obinutuzumab in ~4 weeks   Patient Education I spoke with patient for overview of new oral chemotherapy medication: venetoclax  Administration: Counseled patient on administration, dosing, side effects, monitoring, drug-food interactions, safe handling, storage, and disposal. Patient will take by mouth daily. Take 20 mg for 7 days, then 50 mg daily x 7d, then 100 mg daily x 7d, then 200 mg daily x 7d. Take with food & water.  Side Effects: Side effects include but not limited to: TLS, N/V, fatigue, diarrhea, decreased wbc/hgb/plt.   TLS: discussed with patient the importance of hydration and the reasoning for dose escalation. He is already on allopurinol.  Diarrhea: discussed using loperamide as needed N/V: He will call for antiemesis medication if needed  Drug-drug Interactions (DDI): No DDIs with venetoclax  Adherence: After discussion with patient no patient barriers to medication adherence identified.  Reviewed with patient importance of keeping a medication schedule and plan for any missed doses.  [facility] voiced understanding and appreciation. All questions answered. Medication handout provided.  Provided patient with [facility] phone number. Patient knows to call the office with questions or concerns. [facility] will continue to follow.  Patient expressed understanding and was in agreement with this plan. He also understands that He can call clinic at any  time with any questions, concerns, or complaints.   Medication Access Issues: No issues, venetoclax filled at [facility]  Follow-up plan: will see pt in ~4 weeks with venetoclax initiation   Thank you for allowing me to participate in the care of this patient.   Time Total: 30 mins  Visit consisted of counseling and education on dealing with issues of symptom management in the setting of serious and potentially life-threatening illness.Greater than 50%  of this time was spent counseling and coordinating care related to the above assessment and plan.  Signed by: Darl Pikes, PharmD, BCPS, Salley Slaughter, CPP Hematology/Oncology Clinical Pharmacist Practitioner [facility] [phone]  08/10/2021 11:25 AM

## 2021-08-10 NOTE — Patient Instructions (Signed)
[facility]  Discharge Instructions: Thank you for choosing [facility] to provide your oncology and hematology care.  If you have a lab appointment with the [facility], please go directly to the [facility] and check in at the registration area.  Wear comfortable clothing and clothing appropriate for easy access to any Portacath or PICC line.   We strive to give you quality time with your provider. You may need to reschedule your appointment if you arrive late (15 or more minutes).  Arriving late affects you and other patients whose appointments are after yours.  Also, if you miss three or more appointments without notifying the office, you may be dismissed from the clinic at the provider's discretion.      For prescription refill requests, have your pharmacy contact our office and allow 72 hours for refills to be completed.    Today you received the following chemotherapy and/or immunotherapy agents: Gazyva      To help prevent nausea and vomiting after your treatment, we encourage you to take your nausea medication as directed.  BELOW ARE SYMPTOMS THAT SHOULD BE REPORTED IMMEDIATELY: *FEVER GREATER THAN 100.4 F (38 C) OR HIGHER *CHILLS OR SWEATING *NAUSEA AND VOMITING THAT IS NOT CONTROLLED WITH YOUR NAUSEA MEDICATION *UNUSUAL SHORTNESS OF BREATH *UNUSUAL BRUISING OR BLEEDING *URINARY PROBLEMS (pain or burning when urinating, or frequent urination) *BOWEL PROBLEMS (unusual diarrhea, constipation, pain near the anus) TENDERNESS IN MOUTH AND THROAT WITH OR WITHOUT PRESENCE OF ULCERS (sore throat, sores in mouth, or a toothache) UNUSUAL RASH, SWELLING OR PAIN  UNUSUAL VAGINAL DISCHARGE OR ITCHING   Items with * indicate a potential emergency and should be followed up as soon as possible or go to the Emergency Department if any problems should occur.  Please show the CHEMOTHERAPY ALERT CARD or IMMUNOTHERAPY ALERT CARD at check-in to  the Emergency Department and triage nurse.  Should you have questions after your visit or need to cancel or reschedule your appointment, please contact [facility]  [phone] and follow the prompts.  Office hours are 8:00 a.m. to 4:30 p.m. Monday - Friday. Please note that voicemails left after 4:00 p.m. may not be returned until the following business day.  We are closed weekends and major holidays. You have access to a nurse at all times for urgent questions. Please call the main number to the clinic [phone] and follow the prompts.  For any non-urgent questions, you may also contact your provider using MyChart. We now offer e-Visits for anyone 55 and older to request care online for non-urgent symptoms. For details visit [email or web address].   Also download the MyChart app! Go to the app store, search "MyChart", open the app, select , and log in with your MyChart username and password.  Due to Covid, a mask is required upon entering the hospital/clinic. If you do not have a mask, one will be given to you upon arrival. For doctor visits, patients may have 1 support person aged 52 or older with them. For treatment visits, patients cannot have anyone with them due to current Covid guidelines and our immunocompromised population. Obinutuzumab injection What is this medication? OBINUTUZUMAB (OH bi nue TOOZ ue mab) is a monoclonal antibody. It is used to treat chronic lymphocytic leukemia (CLL) and a type of non-Hodgkin lymphoma (NHL), follicular lymphoma. This medicine may be used for other purposes; ask your health care provider or pharmacist if you have questions. COMMON BRAND NAME(S):  GAZYVA What should I tell my care team before I take this medication? They need to know if you have any of these conditions: infection (especially a virus infection such as hepatitis B virus) lung or breathing disease heart disease take medicines that treat or  prevent blood clots an unusual or allergic reaction to obinutuzumab, other medicines, foods, dyes, or preservatives pregnant or trying to get pregnant breast-feeding How should I use this medication? This medicine is for infusion into a vein. It is given by a health care professional in a hospital or clinic setting. Talk to your pediatrician regarding the use of this medicine in children. Special care may be needed. Overdosage: If you think you have taken too much of this medicine contact a poison control center or emergency room at once. NOTE: This medicine is only for you. Do not share this medicine with others. What if I miss a dose? Keep appointments for follow-up doses as directed. It is important not to miss your dose. Call your doctor or health care professional if you are unable to keep an appointment. What may interact with this medication? live virus vaccines This list may not describe all possible interactions. Give your health care provider a list of all the medicines, herbs, non-prescription drugs, or dietary supplements you use. Also tell them if you smoke, drink alcohol, or use illegal drugs. Some items may interact with your medicine. What should I watch for while using this medication? Report any side effects that you notice during your treatment right away, such as changes in your breathing, fever, chills, dizziness or lightheadedness. These effects are more common with the first dose. Visit your prescriber or health care professional for checks on your progress. You will need to have regular blood work. Report any other side effects. The side effects of this medicine can continue after you finish your treatment. Continue your course of treatment even though you feel ill unless your doctor tells you to stop. Call your doctor or health care professional for advice if you get a fever, chills or sore throat, or other symptoms of a cold or flu. Do not treat yourself. This drug  decreases your body's ability to fight infections. Try to avoid being around people who are sick. This medicine may increase your risk to bruise or bleed. Call your doctor or health care professional if you notice any unusual bleeding. Do not become pregnant while taking this medicine or for 6 months after stopping it. Women should inform their doctor if they wish to become pregnant or think they might be pregnant. There is a potential for serious side effects to an unborn child. Talk to your health care professional or pharmacist for more information. Do not breast-feed an infant while taking this medicine or for 6 months after stopping it. What side effects may I notice from receiving this medication? Side effects that you should report to your doctor or health care professional as soon as possible: allergic reactions like skin rash, itching or hives, swelling of the face, lips, or tongue breathing problems changes in vision chest pain or chest tightness confusion dizziness loss of balance or coordination low blood counts - this medicine may decrease the number of white blood cells, red blood cells and platelets. You may be at increased risk for infections and bleeding. signs of decreased platelets or bleeding - bruising, pinpoint red spots on the skin, black, tarry stools, blood in the urine signs of infection - fever or chills, cough, sore throat, pain  or trouble passing urine signs and symptoms of liver injury like dark yellow or brown urine; general ill feeling or flu-like symptoms; light-colored stools; loss of appetite; nausea; right upper belly pain; unusually weak or tired; yellowing of the eyes or skin trouble speaking or understanding trouble walking vomiting Side effects that usually do not require medical attention (report to your doctor or health care professional if they continue or are bothersome): constipation joint pain muscle pain This list may not describe all possible  side effects. Call your doctor for medical advice about side effects. You may report side effects to FDA at 1-800-FDA-1088. Where should I keep my medication? This drug is only given in a hospital or clinic and will not be stored at home. NOTE: This sheet is a summary. It may not cover all possible information. If you have questions about this medicine, talk to your doctor, pharmacist, or health care provider.  2022 Elsevier/Gold Standard (2019-02-25 15:34:53)

## 2021-08-10 NOTE — Assessment & Plan Note (Addendum)
#  SLL/CLL- PET scan- June 2022- Marked progression of lymphoproliferative process -Axillary /neck retroperitoneal adenopathy.  Currently on Gazyva ALONE [plan to add venetoclax with cycle#3; however see below-] 12 months therapy.  Currently on Gazyva s/p #2 ONLY. SEP 9th, 2022- Significant interval decrease in size of numerous enlarged, bulky lymph nodes in the chest, abdomen, and pelvis, consistent with treatment response of lymphoma  # Proceed with cycle #3 of Gazyva today again. Labs today reviewed;  acceptable for treatment today.HOLD admission to the hospital for venetoclax therapy given pt's anniversary/preference.  We will proceed with venetoclax second level.  # Continue  shingles/TLS prophylaxis: Acyclovir/allopurinol- STABLE. Will monitor labs post venatoclax; and tonite in the hospital.   # EVUSHLED: s/p  COVID EVUSHELD PROPHYLAXIS  # Mediport placement: No malfunction noted- STABLE.   # DISPOSITION: # infusion today.  # Follow up in 4 week [wed].  MD; labs- cbc/cmp; uric acid;mag' phos LDH- Gazyva;possible  admission for venclexta- Dr.B  # I reviewed the blood work- with the patient in detail; also reviewed the imaging independently [as summarized above]; and with the patient in detail.

## 2021-08-10 NOTE — Progress Notes (Signed)
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   AUG 2021-Head and neck: The brain demonstrates symmetric appearing diffuse physiologic uptake without focal finding to suggest abnormality. In the left posterior and medial maxillary cavity, there is an area of mucoid deposition seen within SUV max of 28.8. This was seen in the previous study of November. The specific bony erosion, clear-cut soft tissue lesion is not detected. This probably represents a chronic sinus disease focus. The bilateral optic nerves and extraocular muscles to the largest nodes are seen on axial image 98. 1 now demonstrates a short axis measurement of 3.3 cm and the other more medially, a short axis measurement of 2.5 cm. Demonstrate symmetric increased uptake. The left, much greater than right, jugulodigastric and supraclavicular nodes have increased significantly in size and avidity since the previous study. Both of these lymph nodes demonstrate an SUV max of 3.9. No largest lymph node in the right subclavicular space is seen on axial image 104, demonstrating an SUV max of 3.3, and a short axis measurement of 1.4 cm.   On axial image 102, a right paratracheal lymph node is again identified with increased avidity, located immediately posterior to the right thyroid pole. This  area was noted previously, but now increased avidity is seen, with an SUV max of 8.6 in this area. Multiple smaller, and less avid lymph nodes scattered throughout the left greater than right neck are seen, as well as supraclavicular and infraclavicular areas.   Thorax: Significant axillary lymph nodes have increased significantly in size. These are also left greater than right, with the largest on the left seen on image #114 with a short axis measurement of 2.2 cm and an SUV max of 4.3. The largest on the right as seen on image 111, demonstrating a short axis of 1.4 cm and an SUV max of 4.0. Too numerous to count smaller and similarly avid lymph nodes in both axillae are also noted. These are all larger than previously seen and in general, more avid.  Numerous small AP window and lesser mediastinal lymph nodes are seen with minimal increased avidity, appearing similar in size and avidity to the previous study. No other significant mediastinal adenopathy. No hilar adenopathy detected. These likely also represent lymphoproliferative disease.  Hyperinflation of COPD, bibasilar dependent atelectasis, some paucity of lung tissue and biapical scarring are noted without other focal pulmonary mass or nodule, or acute pulmonary parenchymal disease. Physiologic uptake in the left heart is seen, as expected. Small hiatal hernia is again identified.   Abdomen pelvis: Physiologic uptake in the liver, gut and urinary tract is again noted. No focal area of asymmetric uptake in the structures is seen. The spleen also demonstrates generalized increased uptake and stable splenomegaly, up to 14.2 cm. On image 198, a preaortic lymph node is detected of increased size since the previous study, now measuring up to 1.2 cm short axis.  Several  large lymph nodes on image 203 of the mesentery are noted. The largest of these, on image 201 and ureters up to 1.8 cm short axis. Despite the fact that measurable avidity is not detected in  these are the surrounding lymph nodes, these are doubtless involved in the lymphoproliferative disease described, and appear larger. Multiple smaller lymph nodes scattered throughout the mesentery are also seen.  Grossly enlarged right greater than left common iliac chain lymph nodes are again seen, throughout the pelvis and into the inguinal areas bilaterally. The most avid on the right is seen on image #260 to, demonstrates a short axis of 2.5 cm, and an SUV max of 4.9. The largest on the left is identified image #272, demonstrates a short axis of 2.0 cm, and an SUV max of 4.7. These are similar to the previously noted values.  Right greater than left inguinal adenopathy is seen, with the largest lymph node seen on the right image 276, demonstrating a short axis of 1.5 cm and an SUV max of 3.9. This is larger in size and avidity than previously seen. There are bilateral enlarged lymph nodes associated with the external iliac vessels on image 277. These measure 2.0 cm on the right in short axis, with an SUV max of 3.9.  On the left, image 278, the external iliac lymph node is larger at 1.8 cm short axis, but with an SUV max of only 3.8. Lesser avid and smaller lymph nodes on both sides are also detected.   Musculoskeletal: Scattered, insignificant uptake is detected in the muscles especially in the bilateral joints, without focal finding to suggest lymphoproliferative disease.   IMPRESSIONS:  Significant increase in numbers, short axis size, and to a large extent, avidity -in the lymph nodes of the left greater than right neck, supraclavicular and infraclavicular areas, bilateral axillary lymph nodes and common iliac chain, external iliac chain and inguinal lymph nodes, consistent with worsening lymphoproliferative disease. The mediastinal lymph nodes seen are still present and unchanged in size or [significantly] in avidity.    [place]- Bx- SLL/CLL.July 2022-Discussed with Dr. Asquith-oncologist,  [place]-reviewed the pathology of the lymph node biopsy in 2021-positive for SLL; negative for cyclin D1/1114 translocation.    # July 20th, 2022- GAZYVA #1 Aletha Halim plan ading VENATOCLAX after 2 cycles/de-bulking]  # Lung cancer screening- on [facility]  # SURVIVORSHIP:   # GENETICS:   DIAGNOSIS:   STAGE:         ;  GOALS:  CURRENT/MOST RECENT THERAPY :       CLL (chronic lymphocytic leukemia) ([place])  10/29/2019 Initial Diagnosis   CLL (chronic lymphocytic leukemia) ([place])   06/15/2021 -  Chemotherapy    Patient is on Treatment Plan: LYMPHOMA CLL/SLL VENETOCLAX + OBINUTUZUMAB Q28D          INTERVAL HISTORY:  A pleasant 70 year-old male patient with CLL/SLL -stage IV bulky adenopathy-currently on Gazyva [plan to add venetoclax with cycle #3]-is here for follow-up; currently s/p cycle #2-he is here to review results of the CT scan.  Patient has noted significant improvement of his neck adenopathy/underarm adenopathy.  No nausea no vomiting.  No further reactions with Gazyva.  Review of Systems  Constitutional:  Positive for malaise/fatigue. Negative for chills, diaphoresis, fever and weight loss.  HENT:  Negative for nosebleeds and sore throat.   Eyes:  Negative for double vision.  Respiratory:  Negative for cough, hemoptysis, sputum production, shortness of breath and wheezing.   Cardiovascular:  Negative for chest pain, palpitations, orthopnea  and leg swelling.  Gastrointestinal:  Negative for abdominal pain, blood in stool, constipation, diarrhea, heartburn, melena, nausea and vomiting.  Genitourinary:  Negative for dysuria, frequency and urgency.  Musculoskeletal:  Negative for back pain and joint pain.  Skin: Negative.  Negative for itching and rash.  Neurological:  Negative for dizziness, tingling, focal weakness, weakness and headaches.  Endo/Heme/Allergies:  Does not bruise/bleed easily.  Psychiatric/Behavioral:  Negative for depression. The patient is not nervous/anxious and  does not have insomnia.     PAST MEDICAL HISTORY :  Past Medical History:  Diagnosis Date   Atherosclerosis of coronary artery 09/28/2016   Chest CT Nov 2017   Elevated C-reactive protein (CRP)    Elevated PSA    Fractured sternum 1999   Herpes    History of rib fracture 1999   multiple   Hyperlipidemia    Psoriasis    Rheumatoid factor positive April 2015   Rosacea    Thoracic aortic atherosclerosis ([place]) 09/28/2016   Chest CT Nov 2017   Tobacco abuse    Vitamin D deficiency disease     PAST SURGICAL HISTORY :   Past Surgical History:  Procedure Laterality Date   APPENDECTOMY  1974   PORTA CATH INSERTION N/A 06/06/2021   Procedure: PORTA CATH INSERTION;  Surgeon: Algernon Huxley, MD;  Location: [facility];  Service: Cardiovascular;  Laterality: N/A;   SHOULDER ARTHROSCOPY  10/23/2016   Procedure: ARTHROSCOPY SHOULDER WITH DISTAL CLAVICLE EXCISION, PARTIAL ACROMIONECTOMY, AND DEBRIDEMENT;  Surgeon: Earnestine Leys, MD;  Location: [facility];  Service: Orthopedics;;   TIBIA FRACTURE SURGERY Left 2004   titanium rod, ORIF    FAMILY HISTORY :   Family History  Problem Relation Age of Onset   Heart disease Father    Osteoarthritis Father    Alzheimer's disease Father    Cancer Neg Hx    Diabetes Neg Hx    Hypertension Neg Hx    Stroke Neg Hx    COPD Neg Hx     SOCIAL HISTORY:   Social History   Tobacco Use   Smoking status: Every Day    Packs/day: 0.50    Years: 45.00    Pack years: 22.50    Types: Cigarettes   Smokeless tobacco: Never   Tobacco comments:    11/6- currently smoking 5 cigarettes a day   Vaping Use   Vaping Use: Never used  Substance Use Topics   Alcohol use: Yes    Alcohol/week: 3.0 standard drinks    Types: 3 Glasses of wine per week    Comment: a couple glasses of wine a week   Drug use: No    ALLERGIES:  is allergic to gluten meal.  MEDICATIONS:  Current Outpatient Medications  Medication Sig Dispense Refill   acetaminophen  (TYLENOL) 325 MG tablet Take by mouth every 6 (six) hours as needed.     acyclovir (ZOVIRAX) 400 MG tablet Take 1 tablet (400 mg total) by mouth 2 (two) times daily. 60 tablet 4   allopurinol (ZYLOPRIM) 300 MG tablet Take 1 tablet (300 mg total) by mouth 2 (two) times daily. 120 tablet 3   diphenhydrAMINE (BENADRYL) 25 MG tablet Take 25 mg by mouth every 6 (six) hours as needed.     lidocaine-prilocaine (EMLA) cream Apply 30 -45 mins prior to port access. 30 g 0   montelukast (SINGULAIR) 10 MG tablet Take 1 tablet (10 mg total) by mouth at bedtime. Start 2 days prior to infusion. Do  not take on the day of infusion. 60 tablet 0   venetoclax (VENCLEXTA) 10 & 50 & 100 MG Starter Pack Take by mouth daily. Take 20 mg for 7 days, then 50 mg daily x 7d, then 100 mg daily x 7d, then 200 mg daily x 7d. Take with food & water. (Patient not taking: Reported on 08/10/2021) 42 tablet 0   No current facility-administered medications for this visit.    PHYSICAL EXAMINATION: ECOG PERFORMANCE STATUS: 0 - Asymptomatic  BP 122/83   Pulse 87   Temp 98 F (36.7 C) (Tympanic)   Resp 20   Ht 5' 6"  (1.676 m)   BMI 25.99 kg/m   There were no vitals filed for this visit.   Physical Exam Constitutional:      Comments: Alone.  Ambulating independently.  Bulky adenopathy noted left supraclavicular region [8cm]. ~7 cm lymph node felt in the left underarm; 5cm lymph node in the right underarm.  ~3 cm  lymph node felt in the right and left groin. [Clinically softer improving]  HENT:     Head: Normocephalic and atraumatic.     Mouth/Throat:     Pharynx: No oropharyngeal exudate.  Eyes:     Pupils: Pupils are equal, round, and reactive to light.  Cardiovascular:     Rate and Rhythm: Normal rate and regular rhythm.  Pulmonary:     Effort: No respiratory distress.     Breath sounds: No wheezing.  Abdominal:     General: Bowel sounds are normal. There is no distension.     Palpations: Abdomen is soft. There is  no mass.     Tenderness: There is no abdominal tenderness. There is no guarding or rebound.  Musculoskeletal:        General: No tenderness. Normal range of motion.     Cervical back: Normal range of motion and neck supple.  Skin:    General: Skin is warm.  Neurological:     Mental Status: He is alert and oriented to person, place, and time.  Psychiatric:        Mood and Affect: Affect normal.    LABORATORY DATA:  I have reviewed the data as listed    Component Value Date/Time   NA 136 08/10/2021 0812   K 3.8 08/10/2021 0812   CL 105 08/10/2021 0812   CO2 24 08/10/2021 0812   GLUCOSE 134 (H) 08/10/2021 0812   BUN 11 08/10/2021 0812   CREATININE 0.90 08/10/2021 0812   CREATININE 0.84 10/06/2019 0000   CALCIUM 9.4 08/10/2021 0812   PROT 6.7 08/10/2021 0812   ALBUMIN 4.0 08/10/2021 0812   AST 19 08/10/2021 0812   ALT 12 08/10/2021 0812   ALKPHOS 77 08/10/2021 0812   BILITOT 0.5 08/10/2021 0812   GFRNONAA >60 08/10/2021 0812   GFRNONAA 91 10/06/2019 0000   GFRAA >60 12/26/2019 0842   GFRAA 106 10/06/2019 0000    No results found for: SPEP, UPEP  Lab Results  Component Value Date   WBC 6.5 08/10/2021   NEUTROABS 4.8 08/10/2021   HGB 14.6 08/10/2021   HCT 42.1 08/10/2021   MCV 88.3 08/10/2021   PLT 238 08/10/2021      Chemistry      Component Value Date/Time   NA 136 08/10/2021 0812   K 3.8 08/10/2021 0812   CL 105 08/10/2021 0812   CO2 24 08/10/2021 0812   BUN 11 08/10/2021 0812   CREATININE 0.90 08/10/2021 0812   CREATININE 0.84 10/06/2019 0000  Component Value Date/Time   CALCIUM 9.4 08/10/2021 0812   ALKPHOS 77 08/10/2021 0812   AST 19 08/10/2021 0812   ALT 12 08/10/2021 0812   BILITOT 0.5 08/10/2021 3536       RADIOGRAPHIC STUDIES: I have personally reviewed the radiological images as listed and agreed with the findings in the report. No results found.   ASSESSMENT & PLAN:   .CLL (chronic lymphocytic leukemia) ([place]) #SLL/CLL- PET scan-  June 2022- Marked progression of lymphoproliferative process -Axillary /neck retroperitoneal adenopathy.  Currently on Gazyva 6 cycles+ venetoclax [plan to add with cycle#3] 12 months therapy.  Currently on Gazyva s/p #2 ONLY. SEP 9th, 2022- Significant interval decrease in size of numerous enlarged, bulky lymph nodes in the chest, abdomen, and pelvis, consistent with treatment response of lymphoma  # Proceed with cycle #3 of Gazyva today again. Labs today reviewed;  acceptable for treatment today.HOLD admission to the hospital for venetoclax therapy given pt's anniversary/preference.   # Continue  shingles/TLS prophylaxis: Acyclovir/allopurinol- STABLE. Will monitor labs post venatoclax; and tonite in the hospital.   # EVUSHLED: s/p  COVID EVUSHELD PROPHYLAXIS  # Mediport placement: No malfunction noted- STABLE.   # DISPOSITION: # infusion today.  # Follow up in 4 week [wed].  MD; labs- cbc/cmp; uric acid;mag' phos LDH- Gazyva;possible  admission for venclexta- Dr.B  # I reviewed the blood work- with the patient in detail; also reviewed the imaging independently [as summarized above]; and with the patient in detail.           Orders Placed This Encounter  Procedures   CBC with Differential    Standing Status:   Future    Standing Expiration Date:   08/10/2022   Comprehensive metabolic panel    Standing Status:   Future    Standing Expiration Date:   08/10/2022   Lactate dehydrogenase    Standing Status:   Future    Standing Expiration Date:   08/10/2022   Magnesium    Standing Status:   Future    Standing Expiration Date:   08/10/2022   Uric acid    Standing Status:   Future    Standing Expiration Date:   08/10/2022   Phosphorus    Standing Status:   Future    Standing Expiration Date:   08/10/2022          Cammie Sickle, MD 08/10/2021 8:27 PM

## 2021-08-29 ENCOUNTER — Other Ambulatory Visit (HOSPITAL_COMMUNITY): Payer: Self-pay

## 2021-09-07 ENCOUNTER — Inpatient Hospital Stay: Payer: Medicare PPO | Admitting: Pharmacist

## 2021-09-07 ENCOUNTER — Inpatient Hospital Stay: Payer: Medicare PPO

## 2021-09-07 ENCOUNTER — Inpatient Hospital Stay (HOSPITAL_BASED_OUTPATIENT_CLINIC_OR_DEPARTMENT_OTHER): Payer: Medicare PPO | Admitting: Internal Medicine

## 2021-09-07 ENCOUNTER — Inpatient Hospital Stay
Admission: AD | Admit: 2021-09-07 | Discharge: 2021-09-08 | DRG: 841 | Disposition: A | Discharge status: Home / Self Care | Payer: Medicare PPO | Source: Ambulatory Visit | Attending: Obstetrics and Gynecology | Admitting: Obstetrics and Gynecology

## 2021-09-07 ENCOUNTER — Inpatient Hospital Stay: Payer: Medicare PPO | Attending: Internal Medicine

## 2021-09-07 ENCOUNTER — Other Ambulatory Visit: Payer: Self-pay

## 2021-09-07 ENCOUNTER — Encounter: Payer: Self-pay | Admitting: Internal Medicine

## 2021-09-07 VITALS — BP 118/96 | HR 95 | Temp 96.1°F | Resp 18

## 2021-09-07 DIAGNOSIS — F17210 Nicotine dependence, cigarettes, uncomplicated: Secondary | ICD-10-CM | POA: Diagnosis present

## 2021-09-07 DIAGNOSIS — E785 Hyperlipidemia, unspecified: Secondary | ICD-10-CM | POA: Diagnosis present

## 2021-09-07 DIAGNOSIS — R739 Hyperglycemia, unspecified: Secondary | ICD-10-CM | POA: Diagnosis present

## 2021-09-07 DIAGNOSIS — Z20822 Contact with and (suspected) exposure to covid-19: Secondary | ICD-10-CM | POA: Diagnosis not present

## 2021-09-07 DIAGNOSIS — C9110 Chronic lymphocytic leukemia of B-cell type not having achieved remission: Secondary | ICD-10-CM | POA: Diagnosis not present

## 2021-09-07 DIAGNOSIS — Z8249 Family history of ischemic heart disease and other diseases of the circulatory system: Secondary | ICD-10-CM | POA: Diagnosis not present

## 2021-09-07 DIAGNOSIS — Z5112 Encounter for antineoplastic immunotherapy: Secondary | ICD-10-CM | POA: Insufficient documentation

## 2021-09-07 DIAGNOSIS — E883 Tumor lysis syndrome: Secondary | ICD-10-CM

## 2021-09-07 DIAGNOSIS — Z79899 Other long term (current) drug therapy: Secondary | ICD-10-CM | POA: Diagnosis not present

## 2021-09-07 DIAGNOSIS — Z9049 Acquired absence of other specified parts of digestive tract: Secondary | ICD-10-CM | POA: Insufficient documentation

## 2021-09-07 DIAGNOSIS — E559 Vitamin D deficiency, unspecified: Secondary | ICD-10-CM | POA: Diagnosis not present

## 2021-09-07 DIAGNOSIS — Z01818 Encounter for other preprocedural examination: Secondary | ICD-10-CM | POA: Diagnosis not present

## 2021-09-07 DIAGNOSIS — K9041 Non-celiac gluten sensitivity: Secondary | ICD-10-CM | POA: Diagnosis not present

## 2021-09-07 DIAGNOSIS — Z818 Family history of other mental and behavioral disorders: Secondary | ICD-10-CM | POA: Insufficient documentation

## 2021-09-07 DIAGNOSIS — Z8261 Family history of arthritis: Secondary | ICD-10-CM | POA: Insufficient documentation

## 2021-09-07 DIAGNOSIS — I2510 Atherosclerotic heart disease of native coronary artery without angina pectoris: Secondary | ICD-10-CM | POA: Diagnosis not present

## 2021-09-07 DIAGNOSIS — R5383 Other fatigue: Secondary | ICD-10-CM | POA: Insufficient documentation

## 2021-09-07 LAB — COMPREHENSIVE METABOLIC PANEL
ALT: 15 U/L (ref 0–44)
ALT: 15 U/L (ref 0–44)
AST: 19 U/L (ref 15–41)
AST: 22 U/L (ref 15–41)
Albumin: 3.9 g/dL (ref 3.5–5.0)
Albumin: 3.9 g/dL (ref 3.5–5.0)
Alkaline Phosphatase: 62 U/L (ref 38–126)
Alkaline Phosphatase: 64 U/L (ref 38–126)
Anion gap: 7 (ref 5–15)
Anion gap: 9 (ref 5–15)
BUN: 9 mg/dL (ref 8–23)
BUN: 8 mg/dL (ref 8–23)
CO2: 25 mmol/L (ref 22–32)
CO2: 25 mmol/L (ref 22–32)
Calcium: 9.3 mg/dL (ref 8.9–10.3)
Calcium: 8.7 mg/dL — ABNORMAL LOW (ref 8.9–10.3)
Chloride: 105 mmol/L (ref 98–111)
Chloride: 104 mmol/L (ref 98–111)
Creatinine, Ser: 0.75 mg/dL (ref 0.61–1.24)
Creatinine, Ser: 0.81 mg/dL (ref 0.61–1.24)
GFR, Estimated: >60 mL/min (ref >60)
GFR, Estimated: >60 mL/min (ref >60)
Glucose, Bld: 111 mg/dL — ABNORMAL HIGH (ref 70–99)
Glucose, Bld: 201 mg/dL — ABNORMAL HIGH (ref 70–99)
Potassium: 4 mmol/L (ref 3.5–5.1)
Potassium: 4 mmol/L (ref 3.5–5.1)
Sodium: 137 mmol/L (ref 135–145)
Sodium: 138 mmol/L (ref 135–145)
Total Bilirubin: 0.5 mg/dL (ref 0.3–1.2)
Total Bilirubin: 0.5 mg/dL (ref 0.3–1.2)
Total Protein: 6.5 g/dL (ref 6.5–8.1)
Total Protein: 6.6 g/dL (ref 6.5–8.1)

## 2021-09-07 LAB — LACTATE DEHYDROGENASE
LDH: 130 U/L (ref 98–192)
LDH: 131 U/L (ref 98–192)

## 2021-09-07 LAB — URIC ACID
Uric Acid, Serum: 2.2 mg/dL — ABNORMAL LOW (ref 3.7–8.6)
Uric Acid, Serum: 1.9 mg/dL — ABNORMAL LOW (ref 3.7–8.6)

## 2021-09-07 LAB — CBC WITH DIFFERENTIAL/PLATELET
Abs Immature Granulocytes: 0.02 10*3/uL (ref 0.00–0.07)
Basophils Absolute: 0.1 10*3/uL (ref 0.0–0.1)
Basophils Relative: 1 %
Eosinophils Absolute: 0.2 10*3/uL (ref 0.0–0.5)
Eosinophils Relative: 3 %
HCT: 44.3 % (ref 39.0–52.0)
Hemoglobin: 15 g/dL (ref 13.0–17.0)
Immature Granulocytes: 0 %
Lymphocytes Relative: 16 %
Lymphs Abs: 0.9 10*3/uL (ref 0.7–4.0)
MCH: 30 pg (ref 26.0–34.0)
MCHC: 33.9 g/dL (ref 30.0–36.0)
MCV: 88.6 fL (ref 80.0–100.0)
Monocytes Absolute: 0.5 10*3/uL (ref 0.1–1.0)
Monocytes Relative: 9 %
Neutro Abs: 4.1 10*3/uL (ref 1.7–7.7)
Neutrophils Relative %: 71 %
Platelets: 250 10*3/uL (ref 150–400)
RBC: 5 MIL/uL (ref 4.22–5.81)
RDW: 14.4 % (ref 11.5–15.5)
WBC: 5.7 10*3/uL (ref 4.0–10.5)
nRBC: 0 % (ref 0.0–0.2)

## 2021-09-07 LAB — MAGNESIUM
Magnesium: 2.1 mg/dL (ref 1.7–2.4)
Magnesium: 1.8 mg/dL (ref 1.7–2.4)

## 2021-09-07 LAB — PHOSPHORUS
Phosphorus: 3.9 mg/dL (ref 2.5–4.6)
Phosphorus: 2.6 mg/dL (ref 2.5–4.6)

## 2021-09-07 MED ORDER — VENETOCLAX 10 MG PO TABS
20.0000 mg | ORAL_TABLET | Freq: Once | ORAL | Status: AC
  Administered 2021-09-08: 20 mg via ORAL
  Filled 2021-09-07: qty 14

## 2021-09-07 MED ORDER — SENNOSIDES-DOCUSATE SODIUM 8.6-50 MG PO TABS: 1.0000 | ORAL_TABLET | Freq: Every evening | ORAL | Status: DC | PRN

## 2021-09-07 MED ORDER — MONTELUKAST SODIUM 10 MG PO TABS: 10.0000 mg | ORAL_TABLET | Freq: Every day | ORAL | Status: DC

## 2021-09-07 MED ORDER — ONDANSETRON HCL 4 MG/2ML IJ SOLN: 4.0000 mg | Freq: Four times a day (QID) | INTRAMUSCULAR | Status: DC | PRN

## 2021-09-07 MED ORDER — SODIUM CHLORIDE 0.9 % IV SOLN
Freq: Once | INTRAVENOUS | Status: AC
Start: 2021-09-07 — End: 2021-09-07
  Filled 2021-09-07: qty 250

## 2021-09-07 MED ORDER — VENETOCLAX 10 & 50 & 100 MG PO TBPK: ORAL_TABLET | Freq: Every day | ORAL | Status: DC

## 2021-09-07 MED ORDER — ACETAMINOPHEN 325 MG PO TABS
650.0000 mg | ORAL_TABLET | Freq: Once | ORAL | Status: AC
  Administered 2021-09-07: 650 mg via ORAL
  Filled 2021-09-07: qty 2

## 2021-09-07 MED ORDER — ONDANSETRON HCL 4 MG PO TABS: 4.0000 mg | ORAL_TABLET | Freq: Four times a day (QID) | ORAL | Status: DC | PRN

## 2021-09-07 MED ORDER — CALCIUM CARBONATE ANTACID 500 MG PO CHEW
2.0000 | CHEWABLE_TABLET | Freq: Four times a day (QID) | ORAL | Status: DC | PRN
  Administered 2021-09-07 – 2021-09-08 (×2): 400 mg via ORAL
  Filled 2021-09-07 (×2): qty 2

## 2021-09-07 MED ORDER — ACYCLOVIR 400 MG PO TABS
400.0000 mg | ORAL_TABLET | Freq: Two times a day (BID) | ORAL | Status: DC
  Filled 2021-09-07: qty 1

## 2021-09-07 MED ORDER — CALCIUM CARBONATE ANTACID 500 MG PO CHEW
1.0000 | CHEWABLE_TABLET | Freq: Three times a day (TID) | ORAL | Status: DC
  Administered 2021-09-08 (×2): 200 mg via ORAL
  Filled 2021-09-07 (×2): qty 1

## 2021-09-07 MED ORDER — SODIUM CHLORIDE 0.9 % IV SOLN
Freq: Once | INTRAVENOUS | Status: AC
  Filled 2021-09-07: qty 250

## 2021-09-07 MED ORDER — VENETOCLAX 10 MG PO TABS
20.0000 mg | ORAL_TABLET | Freq: Every day | ORAL | Status: DC
  Filled 2021-09-07: qty 14

## 2021-09-07 MED ORDER — SODIUM CHLORIDE 0.9 % IV SOLN
1000.0000 mg | Freq: Once | INTRAVENOUS | Status: AC
  Administered 2021-09-07: 1000 mg via INTRAVENOUS
  Filled 2021-09-07: qty 40

## 2021-09-07 MED ORDER — SODIUM CHLORIDE 0.9 % IV SOLN
20.0000 mg | Freq: Once | INTRAVENOUS | Status: AC
  Administered 2021-09-07: 20 mg via INTRAVENOUS
  Filled 2021-09-07: qty 20

## 2021-09-07 MED ORDER — ACETAMINOPHEN 325 MG PO TABS: 650.0000 mg | ORAL_TABLET | Freq: Four times a day (QID) | ORAL | Status: DC | PRN

## 2021-09-07 MED ORDER — SODIUM CHLORIDE 0.9 % IV SOLN: INTRAVENOUS | Status: DC

## 2021-09-07 MED ORDER — ALLOPURINOL 100 MG PO TABS: 300.0000 mg | ORAL_TABLET | Freq: Two times a day (BID) | ORAL | Status: DC

## 2021-09-07 MED ORDER — DIPHENHYDRAMINE HCL 50 MG/ML IJ SOLN
50.0000 mg | Freq: Once | INTRAMUSCULAR | Status: AC
  Administered 2021-09-07: 50 mg via INTRAVENOUS
  Filled 2021-09-07: qty 1

## 2021-09-07 MED ORDER — ACYCLOVIR 200 MG PO CAPS
400.0000 mg | ORAL_CAPSULE | Freq: Two times a day (BID) | ORAL | Status: DC
  Administered 2021-09-07 – 2021-09-08 (×2): 400 mg via ORAL
  Filled 2021-09-07 (×3): qty 2

## 2021-09-07 MED ORDER — SODIUM CHLORIDE 0.9% FLUSH
10.0000 mL | Freq: Once | INTRAVENOUS | Status: AC
  Administered 2021-09-07: 10 mL via INTRAVENOUS
  Filled 2021-09-07: qty 10

## 2021-09-07 MED ORDER — ALLOPURINOL 100 MG PO TABS
300.0000 mg | ORAL_TABLET | Freq: Two times a day (BID) | ORAL | Status: DC
  Administered 2021-09-07 – 2021-09-08 (×2): 300 mg via ORAL
  Filled 2021-09-07 (×2): qty 3

## 2021-09-07 MED ORDER — DIPHENHYDRAMINE HCL 25 MG PO TABS
25.0000 mg | ORAL_TABLET | Freq: Four times a day (QID) | ORAL | Status: DC | PRN
  Filled 2021-09-07: qty 1

## 2021-09-07 NOTE — Progress Notes (Signed)
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   AUG 2021-Head and neck: The brain demonstrates symmetric appearing diffuse physiologic uptake without focal finding to suggest abnormality. In the left posterior and medial maxillary cavity, there is an area of mucoid deposition seen within SUV max of 28.8. This was seen in the previous study of November. The specific bony erosion, clear-cut soft tissue lesion is not detected. This probably represents a chronic sinus disease focus. The bilateral optic nerves and extraocular muscles to the largest nodes are seen on axial image 98. 1 now demonstrates a short axis measurement of 3.3 cm and the other more medially, a short axis measurement of 2.5 cm. Demonstrate symmetric increased uptake. The left, much greater than right, jugulodigastric and supraclavicular nodes have increased significantly in size and avidity since the previous study. Both of these lymph nodes demonstrate an SUV max of 3.9. No largest lymph node in the right subclavicular space is seen on axial image 104, demonstrating an SUV max of 3.3, and a short axis measurement of 1.4 cm.   On axial image 102, a right paratracheal lymph node is again identified with increased avidity, located immediately posterior to the right thyroid pole. This  area was noted previously, but now increased avidity is seen, with an SUV max of 8.6 in this area. Multiple smaller, and less avid lymph nodes scattered throughout the left greater than right neck are seen, as well as supraclavicular and infraclavicular areas.   Thorax: Significant axillary lymph nodes have increased significantly in size. These are also left greater than right, with the largest on the left seen on image #114 with a short axis measurement of 2.2 cm and an SUV max of 4.3. The largest on the right as seen on image 111, demonstrating a short axis of 1.4 cm and an SUV max of 4.0. Too numerous to count smaller and similarly avid lymph nodes in both axillae are also noted. These are all larger than previously seen and in general, more avid.  Numerous small AP window and lesser mediastinal lymph nodes are seen with minimal increased avidity, appearing similar in size and avidity to the previous study. No other significant mediastinal adenopathy. No hilar adenopathy detected. These likely also represent lymphoproliferative disease.  Hyperinflation of COPD, bibasilar dependent atelectasis, some paucity of lung tissue and biapical scarring are noted without other focal pulmonary mass or nodule, or acute pulmonary parenchymal disease. Physiologic uptake in the left heart is seen, as expected. Small hiatal hernia is again identified.   Abdomen pelvis: Physiologic uptake in the liver, gut and urinary tract is again noted. No focal area of asymmetric uptake in the structures is seen. The spleen also demonstrates generalized increased uptake and stable splenomegaly, up to 14.2 cm. On image 198, a preaortic lymph node is detected of increased size since the previous study, now measuring up to 1.2 cm short axis.  Several  large lymph nodes on image 203 of the mesentery are noted. The largest of these, on image 201 and ureters up to 1.8 cm short axis. Despite the fact that measurable avidity is not detected in  these are the surrounding lymph nodes, these are doubtless involved in the lymphoproliferative disease described, and appear larger. Multiple smaller lymph nodes scattered throughout the mesentery are also seen.  Grossly enlarged right greater than left common iliac chain lymph nodes are again seen, throughout the pelvis and into the inguinal areas bilaterally. The most avid on the right is seen on image #260 to, demonstrates a short axis of 2.5 cm, and an SUV max of 4.9. The largest on the left is identified image #272, demonstrates a short axis of 2.0 cm, and an SUV max of 4.7. These are similar to the previously noted values.  Right greater than left inguinal adenopathy is seen, with the largest lymph node seen on the right image 276, demonstrating a short axis of 1.5 cm and an SUV max of 3.9. This is larger in size and avidity than previously seen. There are bilateral enlarged lymph nodes associated with the external iliac vessels on image 277. These measure 2.0 cm on the right in short axis, with an SUV max of 3.9.  On the left, image 278, the external iliac lymph node is larger at 1.8 cm short axis, but with an SUV max of only 3.8. Lesser avid and smaller lymph nodes on both sides are also detected.   Musculoskeletal: Scattered, insignificant uptake is detected in the muscles especially in the bilateral joints, without focal finding to suggest lymphoproliferative disease.   IMPRESSIONS:  Significant increase in numbers, short axis size, and to a large extent, avidity -in the lymph nodes of the left greater than right neck, supraclavicular and infraclavicular areas, bilateral axillary lymph nodes and common iliac chain, external iliac chain and inguinal lymph nodes, consistent with worsening lymphoproliferative disease. The mediastinal lymph nodes seen are still present and unchanged in size or [significantly] in avidity.    [place]- Bx- SLL/CLL.July 2022-Discussed with Dr. Asquith-oncologist,  [place]-reviewed the pathology of [place] lymph node biopsy in 2021-positive for SLL; negative for cyclin D1/1114 translocation.    # July 20th, 2022- GAZYVA #1 Aletha Halim plan ading VENATOCLAX after 2 cycles/de-bulking]  # Lung cancer screening- on [facility]  # SURVIVORSHIP:   # GENETICS:   DIAGNOSIS:   STAGE:         ;  GOALS:  CURRENT/MOST RECENT THERAPY :       CLL (chronic lymphocytic leukemia) ([place])  10/29/2019 Initial Diagnosis   CLL (chronic lymphocytic leukemia) ([place])   06/15/2021 -  Chemotherapy   [facility] is on Treatment Plan : LYMPHOMA CLL/SLL Venetoclax + Obinutuzumab q28d        INTERVAL HISTORY: Ambulating independently.  Accompanied by his wife.  A pleasant 70 year-old male [facility] with CLL/SLL -stage IV bulky adenopathy-currently s/p cycle #3-he is here to proceed with Gazyva; and proceed with venetoclax/admission to the hospital.  [facility] continues to notice significant improvement adenopathy and underarm/neck.  No nausea no vomiting no fevers no chills.  Review of Systems  Constitutional:  Positive for malaise/fatigue. Negative for chills, diaphoresis, fever and weight loss.  HENT:  Negative for nosebleeds and sore throat.   Eyes:  Negative for double vision.  Respiratory:  Negative for cough, hemoptysis, sputum production, shortness of breath and wheezing.   Cardiovascular:  Negative for chest pain, palpitations, orthopnea and leg swelling.  Gastrointestinal:  Negative  for abdominal pain, blood in stool, constipation, diarrhea, heartburn, melena, nausea and vomiting.  Genitourinary:  Negative for dysuria, frequency and urgency.  Musculoskeletal:  Negative for back pain and joint pain.  Skin: Negative.  Negative for itching and rash.  Neurological:  Negative for dizziness, tingling, focal weakness, weakness and headaches.  Endo/Heme/Allergies:  Does not bruise/bleed easily.  Psychiatric/Behavioral:  Negative for depression. The [facility] is not nervous/anxious and does not  have insomnia.     PAST MEDICAL HISTORY :  Past Medical History:  Diagnosis Date  . Atherosclerosis of coronary artery 09/28/2016   Chest CT Nov 2017  . Elevated C-reactive protein (CRP)   . Elevated PSA   . Fractured sternum 1999  . Herpes   . History of rib fracture 1999   multiple  . Hyperlipidemia   . Psoriasis   . Rheumatoid factor positive April 2015  . Rosacea   . Thoracic aortic atherosclerosis ([place]) 09/28/2016   Chest CT Nov 2017  . Tobacco abuse   . Vitamin D deficiency disease     PAST SURGICAL HISTORY :   Past Surgical History:  Procedure Laterality Date  . APPENDECTOMY  1974  . PORTA CATH INSERTION N/A 06/06/2021   Procedure: PORTA CATH INSERTION;  Surgeon: Algernon Huxley, MD;  Location: [facility];  Service: Cardiovascular;  Laterality: N/A;  . SHOULDER ARTHROSCOPY  10/23/2016   Procedure: ARTHROSCOPY SHOULDER WITH DISTAL CLAVICLE EXCISION, PARTIAL ACROMIONECTOMY, AND DEBRIDEMENT;  Surgeon: Earnestine Leys, MD;  Location: [facility];  Service: Orthopedics;;  . TIBIA FRACTURE SURGERY Left 2004   titanium rod, ORIF    FAMILY HISTORY :   Family History  Problem Relation Age of Onset  . Heart disease Father   . Osteoarthritis Father   . Alzheimer's disease Father   . Cancer Neg Hx   . Diabetes Neg Hx   . Hypertension Neg Hx   . Stroke Neg Hx   . COPD Neg Hx     SOCIAL HISTORY:   Social History   Tobacco Use  . Smoking status: Every Day    Packs/day: 0.50    Years: 45.00    Pack years: 22.50    Types: Cigarettes  . Smokeless tobacco: Never  . Tobacco comments:    11/6- currently smoking 5 cigarettes a day   Vaping Use  . Vaping Use: Never used  Substance Use Topics  . Alcohol use: Yes    Alcohol/week: 3.0 standard drinks    Types: 3 Glasses of wine per week    Comment: a couple glasses of wine a week  . Drug use: No    ALLERGIES:  is allergic to gluten meal.  MEDICATIONS:  No current facility-administered medications for this visit.    No current outpatient medications on file.   Facility-Administered Medications Ordered in Other Visits  Medication Dose Route Frequency Provider Last Rate Last Admin  . 0.9 %  sodium chloride infusion   Intravenous Continuous Cammie Sickle, MD 150 mL/hr at 09/07/21 2126 New Bag at 09/07/21 2126  . acetaminophen (TYLENOL) tablet 650 mg  650 mg Oral Q6H PRN Wynetta Fines T, MD      . acyclovir (ZOVIRAX) 200 MG capsule 400 mg  400 mg Oral BID Wynelle Cleveland, RPH   400 mg at 09/07/21 2123  . allopurinol (ZYLOPRIM) tablet 300 mg  300 mg Oral BID Charlaine Dalton R, MD   300 mg at 09/07/21 2123  . [START ON 09/08/2021] calcium carbonate (TUMS -  dosed in mg elemental calcium) chewable tablet 200 mg of elemental calcium  1 tablet Oral TID WC Wynetta Fines T, MD      . calcium carbonate (TUMS - dosed in mg elemental calcium) chewable tablet 400 mg of elemental calcium  2 tablet Oral Q6H PRN Sharion Settler, NP      . diphenhydrAMINE (BENADRYL) tablet 25 mg  25 mg Oral Q6H PRN Wynetta Fines T, MD      . ondansetron [facility]) tablet 4 mg  4 mg Oral Q6H PRN Wynetta Fines T, MD       Or  . ondansetron [facility]) injection 4 mg  4 mg Intravenous Q6H PRN Wynetta Fines T, MD      . senna-docusate (Senokot-S) tablet 1 tablet  1 tablet Oral QHS PRN Lequita Halt, MD      . Derrill Memo ON 09/08/2021] venetoclax (VENCLEXTA) tablet 20 mg  20 mg Oral Once Cammie Sickle, MD       Followed by  . [START ON 09/09/2021] venetoclax (VENCLEXTA) tablet 20 mg  20 mg Oral Q breakfast Cammie Sickle, MD        PHYSICAL EXAMINATION: ECOG PERFORMANCE STATUS: 0 - Asymptomatic  BP 129/81   Pulse 77   Temp 97.7 F (36.5 C) (Tympanic)   Resp 16   Wt 168 lb 4.8 oz (76.3 kg)   BMI 27.16 kg/m   Filed Weights   09/07/21 0833  Weight: 168 lb 4.8 oz (76.3 kg)     Physical Exam Constitutional:      Comments: Alone.  Ambulating independently.  Bulky adenopathy noted left supraclavicular region [8cm]. ~7 cm  lymph node felt in the left underarm; 5cm lymph node in the right underarm.  ~3 cm  lymph node felt in the right and left groin. [Clinically softer improving]  HENT:     Head: Normocephalic and atraumatic.     Mouth/Throat:     Pharynx: No oropharyngeal exudate.  Eyes:     Pupils: Pupils are equal, round, and reactive to light.  Cardiovascular:     Rate and Rhythm: Normal rate and regular rhythm.  Pulmonary:     Effort: No respiratory distress.     Breath sounds: No wheezing.  Abdominal:     General: Bowel sounds are normal. There is no distension.     Palpations: Abdomen is soft. There is no mass.     Tenderness: There is no abdominal tenderness. There is no guarding or rebound.  Musculoskeletal:        General: No tenderness. Normal range of motion.     Cervical back: Normal range of motion and neck supple.  Skin:    General: Skin is warm.  Neurological:     Mental Status: He is alert and oriented to person, place, and time.  Psychiatric:        Mood and Affect: Affect normal.    LABORATORY DATA:  I have reviewed the data as listed    Component Value Date/Time   NA 138 09/07/2021 1347   K 4.0 09/07/2021 1347   CL 104 09/07/2021 1347   CO2 25 09/07/2021 1347   GLUCOSE 201 (H) 09/07/2021 1347   BUN 8 09/07/2021 1347   CREATININE 0.81 09/07/2021 1347   CREATININE 0.84 10/06/2019 0000   CALCIUM 8.7 (L) 09/07/2021 1347   PROT 6.6 09/07/2021 1347   ALBUMIN 3.9 09/07/2021 1347   AST 22 09/07/2021 1347   ALT 15 09/07/2021 1347   ALKPHOS 64 09/07/2021  1347   BILITOT 0.5 09/07/2021 1347   GFRNONAA >60 09/07/2021 1347   GFRNONAA 91 10/06/2019 0000   GFRAA >60 12/26/2019 0842   GFRAA 106 10/06/2019 0000    No results found for: SPEP, UPEP  Lab Results  Component Value Date   WBC 5.7 09/07/2021   NEUTROABS 4.1 09/07/2021   HGB 15.0 09/07/2021   HCT 44.3 09/07/2021   MCV 88.6 09/07/2021   PLT 250 09/07/2021      Chemistry      Component Value Date/Time   NA 138  09/07/2021 1347   K 4.0 09/07/2021 1347   CL 104 09/07/2021 1347   CO2 25 09/07/2021 1347   BUN 8 09/07/2021 1347   CREATININE 0.81 09/07/2021 1347   CREATININE 0.84 10/06/2019 0000      Component Value Date/Time   CALCIUM 8.7 (L) 09/07/2021 1347   ALKPHOS 64 09/07/2021 1347   AST 22 09/07/2021 1347   ALT 15 09/07/2021 1347   BILITOT 0.5 09/07/2021 1347       RADIOGRAPHIC STUDIES: I have personally reviewed the radiological images as listed and agreed with the findings in the report. No results found.   ASSESSMENT & PLAN:   .CLL (chronic lymphocytic leukemia) ([place]) #SLL/CLL- PET scan- June 2022- Marked progression of lymphoproliferative process -Axillary /neck retroperitoneal adenopathy.  Currently on Gazyva ALONE [plan to add venetoclax with cycle#3; however see below-] 12 months therapy.  Currently on Gazyva s/p #2 ONLY. SEP 9th, 2022- Significant interval decrease in size of numerous enlarged, bulky lymph nodes in the chest, abdomen, and pelvis, consistent with treatment response of lymphoma  # Proceed with cycle #4 of Gazyva today again. Labs today reviewed;  acceptable for treatment today.  We will proceed with venetoclax/ in [facility] admission today.  Discussed with pharmacy.  # Continue  shingles/TLS prophylaxis: Acyclovir/allopurinol- STABLE;  Will monitor labs post venatoclax; and tonite in the hospital.   # EVUSHLED: s/p  COVID EVUSHELD PROPHYLAXIS  # Mediport placement: No malfunction noted- STABLE.   # DISPOSITION: # infusion today.  # admit to hospital after infusion today.  # Follow up in 4 week   MD; labs- cbc/cmp; uric acid;mag' phos LDH- Gazyva- Dr.B  Addendum: Post chemotherapy/venetoclax-labs-no evidence of hemolysis.  [facility] will continued on IV fluids 150 cc an hour overnight and will repeat labs; tonight and again in the morning.          Orders Placed This Encounter  Procedures  . CBC with Differential    Standing Status:   Future     Standing Expiration Date:   09/07/2022  . Comprehensive metabolic panel    Standing Status:   Future    Number of Occurrences:   1    Standing Expiration Date:   09/07/2022  . Magnesium    Standing Status:   Future    Number of Occurrences:   1    Standing Expiration Date:   09/07/2022  . Lactate dehydrogenase    Standing Status:   Future    Number of Occurrences:   1    Standing Expiration Date:   09/07/2022  . Uric acid    Standing Status:   Future    Number of Occurrences:   1    Standing Expiration Date:   09/07/2022  . Phosphorus    Standing Status:   Future    Number of Occurrences:   1    Standing Expiration Date:   09/07/2022  Cammie Sickle, MD 09/07/2021 9:49 PM

## 2021-09-07 NOTE — Assessment & Plan Note (Addendum)
#  SLL/CLL- PET scan- June 2022- Marked progression of lymphoproliferative process -Axillary /neck retroperitoneal adenopathy.  Currently on Gazyva ALONE [plan to add venetoclax with cycle#3; however see below-] 12 months therapy.  Currently on Gazyva s/p #2 ONLY. SEP 9th, 2022- Significant interval decrease in size of numerous enlarged, bulky lymph nodes in the chest, abdomen, and pelvis, consistent with treatment response of lymphoma  # Proceed with cycle #4 of Gazyva today again. Labs today reviewed;  acceptable for treatment today.  We will proceed with venetoclax/ in patient admission today.  Discussed with pharmacy.  # Continue  shingles/TLS prophylaxis: Acyclovir/allopurinol- STABLE;  Will monitor labs post venatoclax; and tonite in the hospital.   # EVUSHLED: s/p  COVID EVUSHELD PROPHYLAXIS  # Mediport placement: No malfunction noted- STABLE.   # DISPOSITION: # infusion today.  # admit to hospital after infusion today.  # Follow up in 4 week   MD; labs- cbc/cmp; uric acid;mag' phos LDH- Gazyva- Dr.B  Addendum: Post chemotherapy/venetoclax-labs-no evidence of hemolysis.  Patient will continued on IV fluids 150 cc an hour overnight and will repeat labs; tonight and again in the morning.

## 2021-09-07 NOTE — Progress Notes (Signed)
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 Currently receiving obinutuzumab, Venclexta (venetoclax) was added to his treatment today 09/07/21.  Reason for Consult: Oral chemotherapy, venetoclax initation.   PAST MEDICAL HISTORY: Past Medical History:  Diagnosis Date   Atherosclerosis of coronary artery 09/28/2016   Chest CT Nov 2017   Elevated C-reactive protein (CRP)    Elevated PSA    Fractured sternum 1999   Herpes    History of rib fracture 1999   multiple   Hyperlipidemia    Psoriasis    Rheumatoid factor positive April 2015   Rosacea    Thoracic aortic atherosclerosis ([place]) 09/28/2016   Chest CT Nov 2017   Tobacco abuse    Vitamin D deficiency disease     HEMATOLOGY/ONCOLOGY HISTORY:  Oncology History Overview Note  # 2016- CLL-[flow] CD5 (+)/CD23 (+) clonal B-cell population, CLL/SLL phenotype, 18% of leukocytes, <5,000/uL, CD38 positive   # NOV 2020 PET [incidental]- Generalized Lymphadenopathy- left supraclav/ bil ax LN L> R; mesenteric LN; pelvic LN; FEB 5th 2021- 2.5cm left supraclav; Left ax LN- 2.4cm.   AUG 2021-Head and neck: The brain demonstrates symmetric appearing diffuse physiologic uptake without focal finding to suggest abnormality. In the left posterior and medial maxillary cavity, there is an area of mucoid deposition seen within SUV max of 28.8. This was seen in the previous study of November. The specific bony erosion, clear-cut soft tissue lesion is not detected.  This probably represents a chronic sinus disease focus. The bilateral optic nerves and extraocular muscles to the largest nodes are seen on axial image 98. 1 now demonstrates a short axis measurement of 3.3 cm and the other more medially, a short axis measurement of 2.5 cm. Demonstrate symmetric increased uptake. The left, much greater than right, jugulodigastric and supraclavicular nodes have increased significantly in size and avidity since the previous study. Both of these lymph nodes demonstrate an SUV max of 3.9. No largest lymph node in the right subclavicular space is seen on axial image 104, demonstrating an SUV max of 3.3, and a short axis measurement of 1.4 cm.   On axial image 102, a right paratracheal lymph node is again identified with increased avidity, located immediately posterior to the right thyroid pole. This area was noted previously, but now increased avidity is seen, with an SUV max of 8.6 in this area. Multiple smaller, and less avid lymph nodes scattered throughout the left greater than right neck are seen, as well as supraclavicular and infraclavicular areas.   Thorax: Significant axillary lymph nodes have increased significantly in size. These are also left greater than right, with the largest on the left seen on image #114 with a short axis measurement of 2.2 cm and an SUV max of 4.3. The largest on the right as seen on image 111, demonstrating a short axis of 1.4 cm and an SUV max of 4.0. Too numerous to count smaller and similarly avid lymph nodes in both axillae are also noted. These are all larger than previously seen and in general, more avid.  Numerous small AP window and lesser mediastinal lymph nodes are seen with minimal increased avidity, appearing similar in size and avidity to the previous study. No other significant mediastinal adenopathy. No hilar adenopathy detected. These likely also represent lymphoproliferative disease.  Hyperinflation of COPD, bibasilar dependent  atelectasis, some paucity of lung tissue and biapical scarring are noted without other focal pulmonary mass or nodule, or acute pulmonary parenchymal disease. Physiologic uptake in the left heart is seen, as expected. Small hiatal hernia is again identified.   Abdomen pelvis: Physiologic uptake in the liver, gut and urinary tract is again noted. No focal area of asymmetric uptake in the structures is seen. The spleen also demonstrates generalized increased uptake and stable splenomegaly, up to 14.2 cm. On image 198, a preaortic lymph node is detected of increased size since the previous study, now measuring up to 1.2 cm short axis.  Several large lymph nodes on image 203 of the mesentery are noted. The largest of these, on image 201 and ureters up to 1.8 cm short axis. Despite the fact that measurable avidity is not detected in these are the surrounding lymph nodes, these are doubtless involved in the lymphoproliferative disease described, and appear larger. Multiple smaller lymph nodes scattered throughout the mesentery are also seen.  Grossly enlarged right greater than left common iliac chain lymph nodes are again seen, throughout the pelvis and into the inguinal areas bilaterally. The most avid on the right is seen on image #260 to, demonstrates a short axis of 2.5 cm, and an SUV max of 4.9. The largest on the left is identified image #272, demonstrates a short axis of 2.0 cm, and an SUV max of 4.7. These are similar to the previously noted values.  Right greater than left inguinal adenopathy is seen, with the largest lymph node seen on the right image 276, demonstrating a short axis of 1.5 cm and an SUV max of 3.9. This is larger in size and avidity than previously seen. There are bilateral enlarged lymph nodes associated with the external iliac vessels on image 277. These measure 2.0 cm on the right in short axis, with an SUV max of 3.9.  On the left, image 278, the external iliac lymph node is larger at  1.8 cm short axis, but with an SUV max of only 3.8. Lesser avid and smaller lymph nodes on both sides are also detected.   Musculoskeletal: Scattered, insignificant uptake is detected in the muscles especially in the bilateral joints, without focal finding to suggest lymphoproliferative disease.   IMPRESSIONS:  Significant increase in numbers, short axis size, and to a large extent, avidity -in the lymph nodes of the left greater than right neck, supraclavicular and infraclavicular areas, bilateral axillary lymph nodes and common iliac chain, external iliac chain and inguinal lymph nodes, consistent with worsening lymphoproliferative disease. The mediastinal lymph nodes seen are still present and unchanged in size or [significantly] in avidity.    [place]- Bx- SLL/CLL.July 2022-Discussed with Dr. Asquith-oncologist, [place]-reviewed the pathology of the lymph node biopsy in 2021-positive for SLL; negative for cyclin D1/1114 translocation.    # July 20th, 2022- GAZYVA #1 Aletha Halim plan [place] after 2 cycles/de-bulking]  # Lung cancer screening- on [facility]  # SURVIVORSHIP:   # GENETICS:   DIAGNOSIS:   STAGE:         ;  GOALS:  CURRENT/MOST RECENT THERAPY :       CLL (chronic lymphocytic leukemia) ([place])  10/29/2019 Initial Diagnosis   CLL (chronic lymphocytic  leukemia) ([place])   06/15/2021 -  Chemotherapy   Patient is on Treatment Plan : LYMPHOMA CLL/SLL Venetoclax + Obinutuzumab q28d       ALLERGIES:  is allergic to gluten meal.  MEDICATIONS:  Current Outpatient Medications  Medication Sig Dispense Refill   acetaminophen (TYLENOL) 325 MG tablet Take by mouth every 6 (six) hours as needed.     acyclovir (ZOVIRAX) 400 MG tablet Take 1 tablet (400 mg total) by mouth 2 (two) times daily. 60 tablet 4   allopurinol (ZYLOPRIM) 300 MG tablet Take 1 tablet (300 mg total) by mouth 2 (two) times daily. 120 tablet 3   diphenhydrAMINE (BENADRYL) 25 MG tablet Take 25 mg by mouth every 6 (six)  hours as needed.     lidocaine-prilocaine (EMLA) cream Apply 30 -45 mins prior to port access. 30 g 0   montelukast (SINGULAIR) 10 MG tablet Take 1 tablet (10 mg total) by mouth at bedtime. Start 2 days prior to infusion. Do not take on the day of infusion. 60 tablet 0   venetoclax (VENCLEXTA) 10 & 50 & 100 MG Starter Pack Take by mouth daily. Take 20 mg for 7 days, then 50 mg daily x 7d, then 100 mg daily x 7d, then 200 mg daily x 7d. Take with food & water. (Patient not taking: Reported on 09/07/2021) 42 tablet 0   No current facility-administered medications for this visit.   Facility-Administered Medications Ordered in Other Visits  Medication Dose Route Frequency Provider Last Rate Last Admin   dexamethasone (DECADRON) 20 mg in sodium chloride 0.9 % 50 mL IVPB  20 mg Intravenous Once Cammie Sickle, MD 208 mL/hr at 09/07/21 0949 20 mg at 09/07/21 0949   obinutuzumab (GAZYVA) 1,000 mg in sodium chloride 0.9 % 250 mL (3.4483 mg/mL) chemo infusion  1,000 mg Intravenous Once Cammie Sickle, MD        VITAL SIGNS: There were no vitals taken for this visit. There were no vitals filed for this visit.  Estimated body mass index is 27.16 kg/m as calculated from the following:   Height as of 08/10/21: 5' 6"  (1.676 m).   Weight as of an earlier encounter on 09/07/21: 76.3 kg (168 lb 4.8 oz).  LABS: CBC:    Component Value Date/Time   WBC 5.7 09/07/2021 0811   HGB 15.0 09/07/2021 0811   HGB 14.5 08/17/2015 1135   HCT 44.3 09/07/2021 0811   HCT 43.1 08/17/2015 1135   PLT 250 09/07/2021 0811   PLT 240 08/17/2015 1135   MCV 88.6 09/07/2021 0811   MCV 89 08/17/2015 1135   NEUTROABS 4.1 09/07/2021 0811   NEUTROABS 4.0 08/17/2015 1135   LYMPHSABS 0.9 09/07/2021 0811   LYMPHSABS 4.6 (H) 08/17/2015 1135   MONOABS 0.5 09/07/2021 0811   EOSABS 0.2 09/07/2021 0811   EOSABS 0.2 08/17/2015 1135   BASOSABS 0.1 09/07/2021 0811   BASOSABS 0.0 08/17/2015 1135   Comprehensive  Metabolic Panel:    Component Value Date/Time   NA 137 09/07/2021 0811   K 4.0 09/07/2021 0811   CL 105 09/07/2021 0811   CO2 25 09/07/2021 0811   BUN 9 09/07/2021 0811   CREATININE 0.75 09/07/2021 0811   CREATININE 0.84 10/06/2019 0000   GLUCOSE 111 (H) 09/07/2021 0811   CALCIUM 9.3 09/07/2021 0811   AST 19 09/07/2021 0811   ALT 15 09/07/2021 0811   ALKPHOS 62 09/07/2021 0811   BILITOT 0.5 09/07/2021 0811   PROT 6.5 09/07/2021 2025  ALBUMIN 3.9 09/07/2021 0811     Present during today's visit: patient only, seen in infusion  Assessment and Plan: Patient took his first dose of venetoclax 63m in infusion today @0934 . He will be admitted to the hospital for TLS monitoring this afternoon following the completion of his obinutuzumab infusion and completion of clinic post dose labs. Home supply of venetoclax 247mdose pack was taken to the inpatient pharmacy at 1445 on 10/12. He will receive his second dose of venetoclax 2064momorrow morning at 0900 following his 0700 labs TLS order set used to place inpatient orders, orders reviewed with Dr. BraRogue Bussinghen signed & held.   Medication Access Issues: no issues, patient provided with the remainder of his venetoclax CLL starter kit weeks 2-4.  Patient expressed understanding and was in agreement with this plan. He also understands that He can call clinic at any time with any questions, concerns, or complaints.   Follow-up plan: will review patient's inpatient progress tomorrow  Thank you for allowing me to participate in the care of this very pleasant patient.   Time Total: 30 mins  Visit consisted of counseling and education on dealing with issues of symptom management in the setting of serious and potentially life-threatening illness.Greater than 50%  of this time was spent counseling and coordinating care related to the above assessment and plan.  Signed by: AlyDarl PikesharmD, BCPS, BCOSalley SlaughterPP Hematology/Oncology  Clinical Pharmacist Practitioner [phone]/10/2021 10:00 AM

## 2021-09-07 NOTE — Progress Notes (Signed)
Pt evaluated by MD prior to tx. Pt completed premeds/ Gazyva without incident. Pt to be admitted directly today as he is starting Venetoclax. Labs drawn after completion of Gazyva. Hydration started as ordered at 150 ml / hr. Pt transferred to 1C via wheelchair and report given to charge nurse.

## 2021-09-07 NOTE — Progress Notes (Signed)
Patient here for treatment check no concerns today.

## 2021-09-07 NOTE — H&P (Addendum)
History and Physical    Andres Mullins MEB:[id number] DOB: March 02, 1951 DOA: 09/07/2021  PCP: [name], PA-C (Confirm with patient/family/NH records and if not entered, this has to be entered at [facility] point of entry) Patient coming from: Home  I have personally briefly reviewed patient's old medical records in [place]  Chief Complaint: Feeling ok  HPI: Andres Mullins is a 70 y.o. male with medical history significant of CLL/B-cell lymphoma, on ongoing chemo and biological therapy, who was sent from oncologist office to monitor tumor lysis syndrome after starting new biological agent Venetoclax today.  Patient has no complaints, no pain shortness of breath, no urinary problems no fever or chills.  Patient has history of CLL following with oncology, February this year, it was found patient has worsening of CLL and new bulky lymphadenopathy.  Work-up including biopsy suspected CLL transformation/lymphoma, and patient was started on monotherapy of Gazyva for 2 cycles in last two months and oncology started patient  on venetoclax. Given the known risk of tumor lysis syndrome associated with venetoclax, oncology recommend patient admitted to [facility], for close monitoring labs including uric acid, LDH, kidney function, magnesium and phosphorus.   Review of Systems: As per HPI otherwise 14 point review of systems negative.    Past Medical History:  Diagnosis Date   Atherosclerosis of coronary artery 09/28/2016   Chest CT Nov 2017   Elevated C-reactive protein (CRP)    Elevated PSA    Fractured sternum 1999   Herpes    History of rib fracture 1999   multiple   Hyperlipidemia    Psoriasis    Rheumatoid factor positive April 2015   Rosacea    Thoracic aortic atherosclerosis ([place]) 09/28/2016   Chest CT Nov 2017   Tobacco abuse    Vitamin D deficiency disease     Past Surgical History:  Procedure Laterality Date   APPENDECTOMY  1974   PORTA CATH INSERTION N/A 06/06/2021    Procedure: PORTA CATH INSERTION;  Surgeon: Algernon Huxley, MD;  Location: [place] [facility];  Service: Cardiovascular;  Laterality: N/A;   SHOULDER ARTHROSCOPY  10/23/2016   Procedure: ARTHROSCOPY SHOULDER WITH DISTAL CLAVICLE EXCISION, PARTIAL ACROMIONECTOMY, AND DEBRIDEMENT;  Surgeon: Earnestine Leys, MD;  Location: [facility] ORS;  Service: Orthopedics;;   TIBIA FRACTURE SURGERY Left 2004   titanium rod, ORIF     reports that he has been smoking cigarettes. He has a 22.50 pack-year smoking history. He has never used smokeless tobacco. He reports current alcohol use of about 3.0 standard drinks per week. He reports that he does not use drugs.  Allergies  Allergen Reactions   Gluten Meal     Gluten intolerance     Family History  Problem Relation Age of Onset   Heart disease Father    Osteoarthritis Father    Alzheimer's disease Father    Cancer Neg Hx    Diabetes Neg Hx    Hypertension Neg Hx    Stroke Neg Hx    COPD Neg Hx      Prior to Admission medications   Medication Sig Start Date End Date Taking? Authorizing Provider  acetaminophen (TYLENOL) 325 MG tablet Take by mouth every 6 (six) hours as needed.    [provider]  acyclovir (ZOVIRAX) 400 MG tablet Take 1 tablet (400 mg total) by mouth 2 (two) times daily. 05/22/21   Cammie Sickle, MD  allopurinol (ZYLOPRIM) 300 MG tablet Take 1 tablet (300 mg total) by mouth  2 (two) times daily. 07/21/21   Cammie Sickle, MD  diphenhydrAMINE (BENADRYL) 25 MG tablet Take 25 mg by mouth every 6 (six) hours as needed.    [provider]  lidocaine-prilocaine (EMLA) cream Apply 30 -45 mins prior to port access. 05/22/21   Cammie Sickle, MD  montelukast (SINGULAIR) 10 MG tablet Take 1 tablet (10 mg total) by mouth at bedtime. Start 2 days prior to infusion. Do not take on the day of infusion. 05/22/21   Cammie Sickle, MD  venetoclax (VENCLEXTA) 10 & 50 & 100 MG Starter Pack Take by mouth daily. Take  20 mg for 7 days, then 50 mg daily x 7d, then 100 mg daily x 7d, then 200 mg daily x 7d. Take with food & water. Patient not taking: Reported on 09/07/2021 08/03/21   Cammie Sickle, MD    Physical Exam: Vitals:   09/07/21 1709 09/07/21 1748  BP: (!) 144/97 (!) 144/97  Pulse: (!) 106 (!) 106  Resp: 20 20  Temp: 97.7 F (36.5 C) 97.7 F (36.5 C)  TempSrc: Oral Oral  SpO2: 98%   Weight:  76.3 kg  Height:  5' 5"  (1.651 m)    Constitutional: NAD, calm, comfortable Vitals:   09/07/21 1709 09/07/21 1748  BP: (!) 144/97 (!) 144/97  Pulse: (!) 106 (!) 106  Resp: 20 20  Temp: 97.7 F (36.5 C) 97.7 F (36.5 C)  TempSrc: Oral Oral  SpO2: 98%   Weight:  76.3 kg  Height:  5' 5"  (1.651 m)   Eyes: PERRL, lids and conjunctivae normal ENMT: Mucous membranes are moist. Posterior pharynx clear of any exudate or lesions.Normal dentition.  Neck: normal, supple, massive subclavian, and cervical nontender lymphadenopathy, no thyromegaly Respiratory: clear to auscultation bilaterally, no wheezing, no crackles. Normal respiratory effort. No accessory muscle use.  Cardiovascular: Regular rate and rhythm, no murmurs / rubs / gallops. No extremity edema. 2+ pedal pulses. No carotid bruits.  Abdomen: no tenderness, no masses palpated. No hepatosplenomegaly. Bowel sounds positive.  Musculoskeletal: no clubbing / cyanosis. No joint deformity upper and lower extremities. Good ROM, no contractures. Normal muscle tone.  Skin: no rashes, lesions, ulcers. No induration Neurologic: CN 2-12 grossly intact. Sensation intact, DTR normal. Strength 5/5 in all 4.  Psychiatric: Normal judgment and insight. Alert and oriented x 3. Normal mood.     Labs on Admission: I have personally reviewed following labs and imaging studies  CBC: Recent Labs  Lab 09/07/21 0811  WBC 5.7  NEUTROABS 4.1  HGB 15.0  HCT 44.3  MCV 88.6  PLT 272   Basic Metabolic Panel: Recent Labs  Lab 09/07/21 0811 09/07/21 1347   NA 137 138  K 4.0 4.0  CL 105 104  CO2 25 25  GLUCOSE 111* 201*  BUN 9 8  CREATININE 0.75 0.81  CALCIUM 9.3 8.7*  MG 2.1 1.8  PHOS 3.9 2.6   GFR: Estimated Creatinine Clearance: 83.2 mL/min (by C-G formula based on SCr of 0.81 mg/dL). Liver Function Tests: Recent Labs  Lab 09/07/21 0811 09/07/21 1347  AST 19 22  ALT 15 15  ALKPHOS 62 64  BILITOT 0.5 0.5  PROT 6.5 6.6  ALBUMIN 3.9 3.9   No results for input(s): LIPASE, AMYLASE in the last 168 hours. No results for input(s): AMMONIA in the last 168 hours. Coagulation Profile: No results for input(s): INR, PROTIME in the last 168 hours. Cardiac Enzymes: No results for input(s): CKTOTAL, CKMB, CKMBINDEX, TROPONINI in the  last 168 hours. BNP (last 3 results) No results for input(s): PROBNP in the last 8760 hours. HbA1C: No results for input(s): HGBA1C in the last 72 hours. CBG: No results for input(s): GLUCAP in the last 168 hours. Lipid Profile: No results for input(s): CHOL, HDL, LDLCALC, TRIG, CHOLHDL, LDLDIRECT in the last 72 hours. Thyroid Function Tests: No results for input(s): TSH, T4TOTAL, FREET4, T3FREE, THYROIDAB in the last 72 hours. Anemia Panel: No results for input(s): VITAMINB12, FOLATE, FERRITIN, TIBC, IRON, RETICCTPCT in the last 72 hours. Urine analysis:    Component Value Date/Time   COLORURINE YELLOW 10/14/2018 1159   APPEARANCEUR Clear 11/12/2018 0830   LABSPEC 1.011 10/14/2018 1159   PHURINE 7.5 10/14/2018 1159   GLUCOSEU Negative 11/12/2018 0830   HGBUR NEGATIVE 10/14/2018 1159   BILIRUBINUR Negative 11/12/2018 0830   KETONESUR NEGATIVE 10/14/2018 1159   PROTEINUR Negative 11/12/2018 0830   PROTEINUR NEGATIVE 10/14/2018 1159   NITRITE Negative 11/12/2018 0830   LEUKOCYTESUR Negative 11/12/2018 0830    Radiological Exams on Admission: No results found.  EKG: Ordered  Assessment/Plan Active Problems:   Tumor lysis syndrome  (please populate well all problems here in Problem List.  (For example, if patient is on BP meds at home and you resume or decide to hold them, it is a problem that needs to be her. Same for CAD, COPD, HLD and so on)  CLL on immuno-therapy -On Gazyva, starting 2nd biological agent Venetoclax today, to prevent and monitor tumor lysis syndrome, as per oncology, start high rate IV fluid, NS at 150 ml/HR -Monitor uric acid, LDH, magnesium and phosphorus as well as kidney function every 8 hours. -Baseline EKG. -Continue prophylactic allopurinol, acyclovir.  Elevated Glucose -No Hx of DM in his past -Check A1C.  DVT prophylaxis: SCD Code Status: Full code Family Communication: None at bedside Disposition Plan: Expect 2-3 days hospital stay Consults called: Dr. Rogue Bussing Admission status: Medsurg admit   Lequita Halt MD [facility] Pager [phone]  09/07/2021, 6:23 PM

## 2021-09-08 ENCOUNTER — Telehealth: Payer: Self-pay | Admitting: Internal Medicine

## 2021-09-08 DIAGNOSIS — C9110 Chronic lymphocytic leukemia of B-cell type not having achieved remission: Principal | ICD-10-CM

## 2021-09-08 LAB — PHOSPHORUS
Phosphorus: 2.3 mg/dL — ABNORMAL LOW (ref 2.5–4.6)
Phosphorus: 2.7 mg/dL (ref 2.5–4.6)
Phosphorus: 3.6 mg/dL (ref 2.5–4.6)

## 2021-09-08 LAB — BASIC METABOLIC PANEL
Anion gap: 4 — ABNORMAL LOW (ref 5–15)
Anion gap: 5 (ref 5–15)
Anion gap: 6 (ref 5–15)
BUN: 13 mg/dL (ref 8–23)
BUN: 15 mg/dL (ref 8–23)
BUN: 16 mg/dL (ref 8–23)
CO2: 24 mmol/L (ref 22–32)
CO2: 25 mmol/L (ref 22–32)
CO2: 26 mmol/L (ref 22–32)
Calcium: 8.9 mg/dL (ref 8.9–10.3)
Calcium: 9 mg/dL (ref 8.9–10.3)
Calcium: 9 mg/dL (ref 8.9–10.3)
Chloride: 106 mmol/L (ref 98–111)
Chloride: 108 mmol/L (ref 98–111)
Chloride: 110 mmol/L (ref 98–111)
Creatinine, Ser: 0.65 mg/dL (ref 0.61–1.24)
Creatinine, Ser: 0.67 mg/dL (ref 0.61–1.24)
Creatinine, Ser: 0.82 mg/dL (ref 0.61–1.24)
GFR, Estimated: >60 mL/min (ref >60)
GFR, Estimated: >60 mL/min (ref >60)
GFR, Estimated: >60 mL/min (ref >60)
Glucose, Bld: 125 mg/dL — ABNORMAL HIGH (ref 70–99)
Glucose, Bld: 136 mg/dL — ABNORMAL HIGH (ref 70–99)
Glucose, Bld: 159 mg/dL — ABNORMAL HIGH (ref 70–99)
Potassium: 3.7 mmol/L (ref 3.5–5.1)
Potassium: 3.8 mmol/L (ref 3.5–5.1)
Potassium: 4 mmol/L (ref 3.5–5.1)
Sodium: 137 mmol/L (ref 135–145)
Sodium: 138 mmol/L (ref 135–145)
Sodium: 139 mmol/L (ref 135–145)

## 2021-09-08 LAB — LACTATE DEHYDROGENASE
LDH: 127 U/L (ref 98–192)
LDH: 144 U/L (ref 98–192)
LDH: 170 U/L (ref 98–192)

## 2021-09-08 LAB — URIC ACID
Uric Acid, Serum: 1.7 mg/dL — ABNORMAL LOW (ref 3.7–8.6)
Uric Acid, Serum: 1.8 mg/dL — ABNORMAL LOW (ref 3.7–8.6)
Uric Acid, Serum: 2 mg/dL — ABNORMAL LOW (ref 3.7–8.6)

## 2021-09-08 LAB — MAGNESIUM
Magnesium: 1.9 mg/dL (ref 1.7–2.4)
Magnesium: 2 mg/dL (ref 1.7–2.4)
Magnesium: 2.1 mg/dL (ref 1.7–2.4)

## 2021-09-08 LAB — RESP PANEL BY RT-PCR (FLU A&B, COVID) ARPGX2
Influenza A by PCR: NEGATIVE
Influenza B by PCR: NEGATIVE
SARS Coronavirus 2 by RT PCR: NEGATIVE

## 2021-09-08 LAB — HEMOGLOBIN A1C
Hgb A1c MFr Bld: 5.5 % (ref 4.8–5.6)
Mean Plasma Glucose: 111 mg/dL

## 2021-09-08 LAB — HIV ANTIBODY (ROUTINE TESTING W REFLEX): HIV Screen 4th Generation wRfx: NONREACTIVE

## 2021-09-08 MED ORDER — ENOXAPARIN SODIUM 40 MG/0.4ML IJ SOSY
40.0000 mg | PREFILLED_SYRINGE | INTRAMUSCULAR | Status: DC
  Administered 2021-09-08: 08:00:00 40 mg via SUBCUTANEOUS
  Filled 2021-09-08: qty 0.4

## 2021-09-08 MED ORDER — HEPARIN SOD (PORK) LOCK FLUSH 100 UNIT/ML IV SOLN
500.0000 [IU] | Freq: Once | INTRAVENOUS | Status: DC
  Filled 2021-09-08: qty 5

## 2021-09-08 NOTE — Discharge Instructions (Signed)
Symptoms of tumor lysis syndrome include nausea, vomiting, diarrhea, loss of appetite, lethargy, blood in the urine, shortness of breath, palpitations, seizures, muscle cramps, and syncope (loss of consciousness). Seek immediate medical attention should any of these symptoms arise.

## 2021-09-08 NOTE — Care Management Important Message (Signed)
Important Message  Patient Details  Name: Andres Mullins MRN: [id number] Date of Birth: 1951-04-25   Medicare Important Message Given:  N/A - LOS <3 / Initial given by admissions     Dannette Barbara 09/08/2021, 4:57 PM

## 2021-09-08 NOTE — Assessment & Plan Note (Addendum)
#  70 year old male patient with history of SLL/CLL-currently on Gazyva plus venetoclax is currently admitted to hospital for monitoring for tumor lysis for cycle #1 of venetoclax.  # SLL/CLL-Currently on Gazyva s/p #3-single agent.  ONLY. SEP 9th, 2022- Significant interval decrease in size of numerous enlarged, bulky lymph nodes in the chest, abdomen, and pelvis, consistent with treatment response of lymphoma.  Currently cycle #4 of Gazyva yesterday.  Started venetoclax-started dose 20 mg yesterday.   # Continue  shingles/TLS prophylaxis: Acyclovir/allopurinol- STABLE;  Will monitor labs post venatoclax; and tonite in the hospital.    Plan:  #Proceed with venetoclax No. 2 dose today.  We will repeat tumor lysis labs later in the afternoon.  If tumor lysis labs are stable/not concerning patient will be discharged home.  #Above plan of care was discussed with patient in detail.  He is in agreement.  Thank you Dr.Wouk  for allowing me to participate in the care of your pleasant patient. Please do not hesitate to contact me with questions or concerns in the interim.  Discussed with Dr.Wouk.   Addendum: The late afternoon/3:00 labs-showed no evidence of tumor lysis.  Patient could be discharged home.  We will make follow-up appointments with the patient next week.

## 2021-09-08 NOTE — Progress Notes (Signed)
PROGRESS NOTE    Andres Mullins  MCN:[id number] DOB: 12/24/1950 DOA: 09/07/2021 PCP: [name], PA-C  Outpatient Specialists: oncology    Brief Narrative:   Hx CLL treated with obinutuzumab. Started on venetoclax 10/12. Medication is associated w/ high risk of tumor lysis syndrome so was admitted for monitoring and preventive measures   Assessment & Plan:   Active Problems:   CLL (chronic lymphocytic leukemia) ([place])  # CLL On obinutuzumab, first dose venetoclax given 10/12. Baseline labs and f/u labs this morning (uric acid, ldh, mg, phos, kidney function, potassium) wnl. Patient feeling well. - continue fluids - measure I/os - will order ldh, bmp, mg, phos, and uric acid to continue q8 hours - second dose venetoclax ordered for today - continue allopurinol and acyclovir - i've reached out to onc regarding duration of monitoring  # Elevated glucose Mild - a1c pending   DVT prophylaxis: lovenox Code Status: full Family Communication: none @ bedside  Level of care: Med-Surg Status is: Inpatient  Remains inpatient appropriate because:Ongoing diagnostic testing needed not appropriate for outpatient work up  Dispo: The patient is from: Home              Anticipated d/c is to: Home              Patient currently is not medically stable to d/c.   Difficult to place patient No        Consultants:  oncology  Procedures: none  Antimicrobials:  none    Subjective: Feels well. Making urine. No abdominal or other pain.  Objective: Vitals:   09/07/21 1942 09/07/21 2341 09/08/21 0315 09/08/21 0742  BP: 118/76 113/73 120/68 101/69  Pulse: (!) 105 88 91 91  Resp: 16  16 16   Temp: 97.6 F (36.4 C) 97.6 F (36.4 C) 97.7 F (36.5 C) 97.8 F (36.6 C)  TempSrc: Oral Oral Oral Oral  SpO2: 98% 97% 96% 97%  Weight:      Height:        Intake/Output Summary (Last 24 hours) at 09/08/2021 0758 Last data filed at 09/08/2021 0300 Gross per 24 hour  Intake  1189.82 ml  Output --  Net 1189.82 ml   Filed Weights   09/07/21 1748  Weight: 76.3 kg    Examination:  General exam: Appears calm and comfortable  Respiratory system: Clear to auscultation. Respiratory effort normal. Cardiovascular system: S1 & S2 heard, RRR. No JVD, murmurs, rubs, gallops or clicks. Trace pedal edema Gastrointestinal system: Abdomen is nondistended, soft and nontender.  Central nervous system: Alert and oriented. No focal neurological deficits. Extremities: moves all 4 symmetrically Skin: No rashes, lesions or ulcers Psychiatry: Judgement and insight appear normal. Mood & affect appropriate.     Data Reviewed: I have personally reviewed following labs and imaging studies  CBC: Recent Labs  Lab 09/07/21 0811  WBC 5.7  NEUTROABS 4.1  HGB 15.0  HCT 44.3  MCV 88.6  PLT 629   Basic Metabolic Panel: Recent Labs  Lab 09/07/21 0811 09/07/21 1347 09/07/21 2355 09/08/21 0549  NA 137 138 137 138  K 4.0 4.0 4.0 3.7  CL 105 104 106 108  CO2 25 25 26 24   GLUCOSE 111* 201* 159* 136*  BUN 9 8 15 13   CREATININE 0.75 0.81 0.82 0.65  CALCIUM 9.3 8.7* 8.9 9.0  MG 2.1 1.8 2.1 1.9  PHOS 3.9 2.6 2.7 3.6   GFR: Estimated Creatinine Clearance: 84.3 mL/min (by C-G formula based on SCr of 0.65 mg/dL).  Liver Function Tests: Recent Labs  Lab 09/07/21 0811 09/07/21 1347  AST 19 22  ALT 15 15  ALKPHOS 62 64  BILITOT 0.5 0.5  PROT 6.5 6.6  ALBUMIN 3.9 3.9   No results for input(s): LIPASE, AMYLASE in the last 168 hours. No results for input(s): AMMONIA in the last 168 hours. Coagulation Profile: No results for input(s): INR, PROTIME in the last 168 hours. Cardiac Enzymes: No results for input(s): CKTOTAL, CKMB, CKMBINDEX, TROPONINI in the last 168 hours. BNP (last 3 results) No results for input(s): PROBNP in the last 8760 hours. HbA1C: No results for input(s): HGBA1C in the last 72 hours. CBG: No results for input(s): GLUCAP in the last 168  hours. Lipid Profile: No results for input(s): CHOL, HDL, LDLCALC, TRIG, CHOLHDL, LDLDIRECT in the last 72 hours. Thyroid Function Tests: No results for input(s): TSH, T4TOTAL, FREET4, T3FREE, THYROIDAB in the last 72 hours. Anemia Panel: No results for input(s): VITAMINB12, FOLATE, FERRITIN, TIBC, IRON, RETICCTPCT in the last 72 hours. Urine analysis:    Component Value Date/Time   COLORURINE YELLOW 10/14/2018 1159   APPEARANCEUR Clear 11/12/2018 0830   LABSPEC 1.011 10/14/2018 1159   PHURINE 7.5 10/14/2018 1159   GLUCOSEU Negative 11/12/2018 0830   HGBUR NEGATIVE 10/14/2018 1159   BILIRUBINUR Negative 11/12/2018 0830   KETONESUR NEGATIVE 10/14/2018 1159   PROTEINUR Negative 11/12/2018 0830   PROTEINUR NEGATIVE 10/14/2018 1159   NITRITE Negative 11/12/2018 0830   LEUKOCYTESUR Negative 11/12/2018 0830   Sepsis Labs: @LABRCNTIP (procalcitonin:4,lacticidven:4)  ) Recent Results (from the past 240 hour(s))  Resp Panel by RT-PCR (Flu A&B, Covid) Nasopharyngeal Swab     Status: None   Collection Time: 09/07/21 11:55 PM   Specimen: Nasopharyngeal Swab; Nasopharyngeal(NP) swabs in vial transport medium  Result Value Ref Range Status   SARS Coronavirus 2 by RT PCR NEGATIVE NEGATIVE Final    Comment: (NOTE) SARS-CoV-2 target nucleic acids are NOT DETECTED.  The SARS-CoV-2 RNA is generally detectable in upper respiratory specimens during the acute phase of infection. The lowest concentration of SARS-CoV-2 viral copies this assay can detect is 138 copies/mL. A negative result does not preclude SARS-Cov-2 infection and should not be used as the sole basis for treatment or other patient management decisions. A negative result may occur with  improper specimen collection/handling, submission of specimen other than nasopharyngeal swab, presence of viral mutation(s) within the areas targeted by this assay, and inadequate number of viral copies(<138 copies/mL). A negative result must be  combined with clinical observations, patient history, and epidemiological information. The expected result is Negative.  Fact Sheet for Patients:  EntrepreneurPulse.com.au  Fact Sheet for Healthcare Providers:  IncredibleEmployment.be  This test is no t yet approved or cleared by the [place] FDA and  has been authorized for detection and/or diagnosis of SARS-CoV-2 by FDA under an Emergency Use Authorization (EUA). This EUA will remain  in effect (meaning this test can be used) for the duration of the COVID-19 declaration under Section 564(b)(1) of the Act, 21 U.S.C.section 360bbb-3(b)(1), unless the authorization is terminated  or revoked sooner.       Influenza A by PCR NEGATIVE NEGATIVE Final   Influenza B by PCR NEGATIVE NEGATIVE Final    Comment: (NOTE) The Xpert Xpress SARS-CoV-2/FLU/RSV plus assay is intended as an aid in the diagnosis of influenza from Nasopharyngeal swab specimens and should not be used as a sole basis for treatment. Nasal washings and aspirates are unacceptable for Xpert Xpress SARS-CoV-2/FLU/RSV testing.  Fact Sheet  for Patients: EntrepreneurPulse.com.au  Fact Sheet for Healthcare Providers: IncredibleEmployment.be  This test is not yet approved or cleared by the [place] FDA and has been authorized for detection and/or diagnosis of SARS-CoV-2 by FDA under an Emergency Use Authorization (EUA). This EUA will remain in effect (meaning this test can be used) for the duration of the COVID-19 declaration under Section 564(b)(1) of the Act, 21 U.S.C. section 360bbb-3(b)(1), unless the authorization is terminated or revoked.  Performed at [facility], [address] [place], [place] [address]          Radiology Studies: No results found.      Scheduled Meds:  acyclovir  400 mg Oral BID   allopurinol  300 mg Oral BID   calcium carbonate  1  tablet Oral TID WC   venetoclax  20 mg Oral Once   Followed by   Derrill Memo ON 09/09/2021] venetoclax  20 mg Oral Q breakfast   Continuous Infusions:  sodium chloride 150 mL/hr at 09/07/21 2126     LOS: 1 day    Time spent: 92 min    Desma Maxim, MD [facility]   If 7PM-7AM, please contact night-coverage [email or web address] Password [email or web address] 09/08/2021, 7:58 AM

## 2021-09-08 NOTE — Telephone Encounter (Signed)
C-please double book on 10/20-at 830-MD labs-CBC CMP; phosphorus; magnesium; LDH; uric acid.   A- please ask patient not to take the next level dose/ramp-up dose of venetoclax [50 mg] as he will be due for it on 10/19.  He could skip 1 dose; and started on 10/20.

## 2021-09-08 NOTE — Consult Note (Signed)
[facility] NOTE  Patient Care Team: [name], PA-C as PCP - General (Family Medicine) Sanda Klein Satira Anis, MD as Attending Physician (Family Medicine) Brendolyn Patty, MD (Dermatology) Billey Co, MD as Consulting Physician (Urology) Cammie Sickle, MD as Consulting Physician (Hematology and Oncology)  CHIEF COMPLAINTS/PURPOSE OF CONSULTATION:  CLL/SLL  HISTORY OF PRESENTING ILLNESS:  Andres Mullins 70 y.o.  male patient with a history of CLL/SLL currently on Gazyva for close monitoring.  For tumor lysis monitoring status post first dose of venetoclax.  Patient received 3 cycles of Gazyva single agent given the bulky lymphadenopathy.  CT scan showed partial response.  However still given significant bulky disease, given risk of tumor lysis on venetoclax-is currently admitted to hospital for close monitoring.  Overnight patient denies any significant complaints.  No nausea no vomiting.  Appetite is good.  No weight loss.  No chest pain.  No tingling or numbness.    Review of Systems  Constitutional:  Negative for chills, diaphoresis, fever, malaise/fatigue and weight loss.  HENT:  Negative for nosebleeds and sore throat.   Eyes:  Negative for double vision.  Respiratory:  Negative for cough, hemoptysis, sputum production, shortness of breath and wheezing.   Cardiovascular:  Negative for chest pain, palpitations, orthopnea and leg swelling.  Gastrointestinal:  Negative for abdominal pain, blood in stool, constipation, diarrhea, heartburn, melena, nausea and vomiting.  Genitourinary:  Negative for dysuria, frequency and urgency.  Musculoskeletal:  Negative for back pain and joint pain.  Skin: Negative.  Negative for itching and rash.  Neurological:  Negative for dizziness, tingling, focal weakness, weakness and headaches.  Endo/Heme/Allergies:  Does not bruise/bleed easily.  Psychiatric/Behavioral:  Negative for depression. The patient is not  nervous/anxious and does not have insomnia.     MEDICAL HISTORY:  Past Medical History:  Diagnosis Date   Atherosclerosis of coronary artery 09/28/2016   Chest CT Nov 2017   Elevated C-reactive protein (CRP)    Elevated PSA    Fractured sternum 1999   Herpes    History of rib fracture 1999   multiple   Hyperlipidemia    Psoriasis    Rheumatoid factor positive April 2015   Rosacea    Thoracic aortic atherosclerosis ([place]) 09/28/2016   Chest CT Nov 2017   Tobacco abuse    Vitamin D deficiency disease     SURGICAL HISTORY: Past Surgical History:  Procedure Laterality Date   APPENDECTOMY  1974   PORTA CATH INSERTION N/A 06/06/2021   Procedure: PORTA CATH INSERTION;  Surgeon: Algernon Huxley, MD;  Location: [facility];  Service: Cardiovascular;  Laterality: N/A;   SHOULDER ARTHROSCOPY  10/23/2016   Procedure: ARTHROSCOPY SHOULDER WITH DISTAL CLAVICLE EXCISION, PARTIAL ACROMIONECTOMY, AND DEBRIDEMENT;  Surgeon: Earnestine Leys, MD;  Location: [facility];  Service: Orthopedics;;   TIBIA FRACTURE SURGERY Left 2004   titanium rod, ORIF    SOCIAL HISTORY: Social History   Socioeconomic History   Marital status: Married    Spouse name: colleen   Number of children: Not on file   Years of education: 12   Highest education level: Bachelor's degree (e.g., BA, AB, BS)  Occupational History   Occupation: retired  Tobacco Use   Smoking status: Every Day    Packs/day: 0.50    Years: 45.00    Pack years: 22.50    Types: Cigarettes   Smokeless tobacco: Never   Tobacco comments:    11/6- currently smoking 5 cigarettes a day  Vaping Use   Vaping Use: Never used  Substance and Sexual Activity   Alcohol use: Yes    Alcohol/week: 3.0 standard drinks    Types: 3 Glasses of wine per week    Comment: a couple glasses of wine a week   Drug use: No   Sexual activity: Yes  Other Topics Concern   Not on file  Social History Narrative   Consumes ~4 cups of coffee/day   Social  Determinants of Health   Financial Resource Strain: Not on file  Food Insecurity: Not on file  Transportation Needs: Not on file  Physical Activity: Not on file  Stress: Not on file  Social Connections: Not on file  Intimate Partner Violence: Not on file    FAMILY HISTORY: Family History  Problem Relation Age of Onset   Heart disease Father    Osteoarthritis Father    Alzheimer's disease Father    Cancer Neg Hx    Diabetes Neg Hx    Hypertension Neg Hx    Stroke Neg Hx    COPD Neg Hx     ALLERGIES:  is allergic to gluten meal.  MEDICATIONS:  No current facility-administered medications for this encounter.   Current Outpatient Medications  Medication Sig Dispense Refill   acetaminophen (TYLENOL) 325 MG tablet Take by mouth every 6 (six) hours as needed.     acyclovir (ZOVIRAX) 400 MG tablet Take 1 tablet (400 mg total) by mouth 2 (two) times daily. 60 tablet 4   allopurinol (ZYLOPRIM) 300 MG tablet Take 1 tablet (300 mg total) by mouth 2 (two) times daily. 120 tablet 3   diphenhydrAMINE (BENADRYL) 25 MG tablet Take 25 mg by mouth every 6 (six) hours as needed.     lidocaine-prilocaine (EMLA) cream Apply 30 -45 mins prior to port access. 30 g 0   montelukast (SINGULAIR) 10 MG tablet Take 1 tablet (10 mg total) by mouth at bedtime. Start 2 days prior to infusion. Do not take on the day of infusion. 60 tablet 0   venetoclax (VENCLEXTA) 10 & 50 & 100 MG Starter Pack Take by mouth daily. Take 20 mg for 7 days, then 50 mg daily x 7d, then 100 mg daily x 7d, then 200 mg daily x 7d. Take with food & water. (Patient not taking: Reported on 09/07/2021) 42 tablet 0      .  PHYSICAL EXAMINATION:  Vitals:   09/08/21 1134 09/08/21 1511  BP: 121/78 113/74  Pulse: 86 89  Resp: 16 18  Temp: 97.7 F (36.5 C) 97.7 F (36.5 C)  SpO2: 99% 98%   Filed Weights   09/07/21 1748  Weight: 168 lb 4.8 oz (76.3 kg)    Physical Exam Vitals and nursing note reviewed.  HENT:     Head:  Normocephalic and atraumatic.     Mouth/Throat:     Pharynx: Oropharynx is clear.  Eyes:     Extraocular Movements: Extraocular movements intact.     Pupils: Pupils are equal, round, and reactive to light.  Cardiovascular:     Rate and Rhythm: Normal rate and regular rhythm.  Pulmonary:     Comments: Decreased breath sounds bilaterally.  Abdominal:     Palpations: Abdomen is soft.  Musculoskeletal:        General: Normal range of motion.     Cervical back: Normal range of motion.  Skin:    General: Skin is warm.     Comments: Bilateral cervical adenopathy left more than right.  Bilateral axillary adenopathy.  Neurological:     General: No focal deficit present.     Mental Status: He is alert and oriented to person, place, and time.  Psychiatric:        Behavior: Behavior normal.        Judgment: Judgment normal.     LABORATORY DATA:  I have reviewed the data as listed Lab Results  Component Value Date   WBC 5.7 09/07/2021   HGB 15.0 09/07/2021   HCT 44.3 09/07/2021   MCV 88.6 09/07/2021   PLT 250 09/07/2021   Recent Labs    05/20/21 0957 06/15/21 0823 08/10/21 0812 09/07/21 0811 09/07/21 1347 09/07/21 2355 09/08/21 0549 09/08/21 1611  NA  --    < > 136 137 138 137 138 139  K  --    < > 3.8 4.0 4.0 4.0 3.7 3.8  CL  --    < > 105 105 104 106 108 110  CO2  --    < > 24 25 25 26 24 25   GLUCOSE  --    < > 134* 111* 201* 159* 136* 125*  BUN  --    < > 11 9 8 15 13 16   CREATININE  --    < > 0.90 0.75 0.81 0.82 0.65 0.67  CALCIUM  --    < > 9.4 9.3 8.7* 8.9 9.0 9.0  GFRNONAA  --    < > >60 >60 >60 >60 >60 >60  PROT 6.8   < > 6.7 6.5 6.6  --   --   --   ALBUMIN 4.0   < > 4.0 3.9 3.9  --   --   --   AST 21   < > 19 19 22   --   --   --   ALT 14   < > 12 15 15   --   --   --   ALKPHOS 70   < > 77 62 64  --   --   --   BILITOT 0.7   < > 0.5 0.5 0.5  --   --   --   BILIDIR <0.1  --   --   --   --   --   --   --   IBILI NOT CALCULATED  --   --   --   --   --   --   --     < > = values in this interval not displayed.    RADIOGRAPHIC STUDIES: I have personally reviewed the radiological images as listed and agreed with the findings in the report. No results found.  CLL (chronic lymphocytic leukemia) ([place]) #70 year old male patient with history of SLL/CLL-currently on Gazyva plus venetoclax is currently admitted to hospital for monitoring for tumor lysis for cycle #1 of venetoclax.  # SLL/CLL-Currently on Gazyva s/p #3-single agent.  ONLY. SEP 9th, 2022- Significant interval decrease in size of numerous enlarged, bulky lymph nodes in the chest, abdomen, and pelvis, consistent with treatment response of lymphoma.  Currently cycle #4 of Gazyva yesterday.  Started venetoclax-started dose 20 mg yesterday.   # Continue  shingles/TLS prophylaxis: Acyclovir/allopurinol- STABLE;  Will monitor labs post venatoclax; and tonite in the hospital.    Plan:  #Proceed with venetoclax No. 2 dose today.  We will repeat tumor lysis labs later in the afternoon.  If tumor lysis labs are stable/not concerning patient will be discharged home.  #Above plan of care was discussed  with patient in detail.  He is in agreement.  Thank you Dr.Wouk  for allowing me to participate in the care of your pleasant patient. Please do not hesitate to contact me with questions or concerns in the interim.  Discussed with Dr.Wouk.   Addendum: The late afternoon/3:00 labs-showed no evidence of tumor lysis.  Patient could be discharged home.  We will make follow-up appointments with the patient next week.     All questions were answered. The patient knows to call the clinic with any problems, questions or concerns.    Cammie Sickle, MD 09/08/2021 10:54 PM

## 2021-09-08 NOTE — Discharge Summary (Signed)
Andres Mullins QIO:[id number] DOB: 12-14-1950 DOA: 09/07/2021  PCP: [name], PA-C  Admit date: 09/07/2021 Discharge date: 09/08/2021  Time spent: 45 minutes  Recommendations for Outpatient Follow-up:  Close f/u with oncology     Discharge Diagnoses:  Active Problems:   CLL (chronic lymphocytic leukemia) ([place])   Discharge Condition: stable  Diet recommendation: regular  Filed Weights   09/07/21 1748  Weight: 76.3 kg    History of present illness and hospital course:  Patient has a history of CLL. Has been treated with obinutuzumab. Decision made to add venetoclax. Given risk of tumor lysis syndrome patient was admitted to the hospital on 10/12 for monitoring for tumor lysis syndrome. First dose of venetoclax was given on 10/12 and second dose given on 10/13. Urine output and labs monitored. No signs/symptoms tumor lysis syndrome. Oncology deemed patient suitable for discharge with close oncology f/u. Will continue allopurinol.  Procedures: none   Consultations: oncology  Discharge Exam: Vitals:   09/08/21 1134 09/08/21 1511  BP: 121/78 113/74  Pulse: 86 89  Resp: 16 18  Temp: 97.7 F (36.5 C) 97.7 F (36.5 C)  SpO2: 99% 98%    General exam: Appears calm and comfortable  Respiratory system: Clear to auscultation. Respiratory effort normal. Cardiovascular system: S1 & S2 heard, RRR. No JVD, murmurs, rubs, gallops or clicks. Trace pedal edema Gastrointestinal system: Abdomen is nondistended, soft and nontender.  Central nervous system: Alert and oriented. No focal neurological deficits. Extremities: moves all 4 symmetrically Skin: No rashes, lesions or ulcers Psychiatry: Judgement and insight appear normal. Mood & affect appropriate.   Discharge Instructions   Discharge Instructions     Diet - low sodium heart healthy   Complete by: As directed    Increase activity slowly   Complete by: As directed       Allergies as of 09/08/2021        Reactions   Gluten Meal    Gluten intolerance        Medication List     TAKE these medications    acetaminophen 325 MG tablet Commonly known as: TYLENOL Take by mouth every 6 (six) hours as needed.   acyclovir 400 MG tablet Commonly known as: ZOVIRAX Take 1 tablet (400 mg total) by mouth 2 (two) times daily.   allopurinol 300 MG tablet Commonly known as: ZYLOPRIM Take 1 tablet (300 mg total) by mouth 2 (two) times daily.   diphenhydrAMINE 25 MG tablet Commonly known as: BENADRYL Take 25 mg by mouth every 6 (six) hours as needed.   lidocaine-prilocaine cream Commonly known as: EMLA Apply 30 -45 mins prior to port access.   montelukast 10 MG tablet Commonly known as: Singulair Take 1 tablet (10 mg total) by mouth at bedtime. Start 2 days prior to infusion. Do not take on the day of infusion.   Venclexta Starting Pack 10 & 50 & 100 MG Starter Pack Generic drug: venetoclax Take by mouth daily. Take 20 mg for 7 days, then 50 mg daily x 7d, then 100 mg daily x 7d, then 200 mg daily x 7d. Take with food & water.       Allergies  Allergen Reactions   Gluten Meal     Gluten intolerance     Follow-up Information     Cammie Sickle, MD. Call.   Specialties: Internal Medicine, Oncology Contact information: [address] [place] [address] [phone]  The results of significant diagnostics from this hospitalization (including imaging, microbiology, ancillary and laboratory) are listed below for reference.    Significant Diagnostic Studies: No results found.  Microbiology: Recent Results (from the past 240 hour(s))  Resp Panel by RT-PCR (Flu A&B, Covid) Nasopharyngeal Swab     Status: None   Collection Time: 09/07/21 11:55 PM   Specimen: Nasopharyngeal Swab; Nasopharyngeal(NP) swabs in vial transport medium  Result Value Ref Range Status   SARS Coronavirus 2 by RT PCR NEGATIVE NEGATIVE Final    Comment:  (NOTE) SARS-CoV-2 target nucleic acids are NOT DETECTED.  The SARS-CoV-2 RNA is generally detectable in upper respiratory specimens during the acute phase of infection. The lowest concentration of SARS-CoV-2 viral copies this assay can detect is 138 copies/mL. A negative result does not preclude SARS-Cov-2 infection and should not be used as the sole basis for treatment or other patient management decisions. A negative result may occur with  improper specimen collection/handling, submission of specimen other than nasopharyngeal swab, presence of viral mutation(s) within the areas targeted by this assay, and inadequate number of viral copies(<138 copies/mL). A negative result must be combined with clinical observations, patient history, and epidemiological information. The expected result is Negative.  Fact Sheet for Patients:  EntrepreneurPulse.com.au  Fact Sheet for Healthcare Providers:  IncredibleEmployment.be  This test is no t yet approved or cleared by the [place] FDA and  has been authorized for detection and/or diagnosis of SARS-CoV-2 by FDA under an Emergency Use Authorization (EUA). This EUA will remain  in effect (meaning this test can be used) for the duration of the COVID-19 declaration under Section 564(b)(1) of the Act, 21 U.S.C.section 360bbb-3(b)(1), unless the authorization is terminated  or revoked sooner.       Influenza A by PCR NEGATIVE NEGATIVE Final   Influenza B by PCR NEGATIVE NEGATIVE Final    Comment: (NOTE) The Xpert Xpress SARS-CoV-2/FLU/RSV plus assay is intended as an aid in the diagnosis of influenza from Nasopharyngeal swab specimens and should not be used as a sole basis for treatment. Nasal washings and aspirates are unacceptable for Xpert Xpress SARS-CoV-2/FLU/RSV testing.  Fact Sheet for Patients: EntrepreneurPulse.com.au  Fact Sheet for Healthcare  Providers: IncredibleEmployment.be  This test is not yet approved or cleared by the [place] FDA and has been authorized for detection and/or diagnosis of SARS-CoV-2 by FDA under an Emergency Use Authorization (EUA). This EUA will remain in effect (meaning this test can be used) for the duration of the COVID-19 declaration under Section 564(b)(1) of the Act, 21 U.S.C. section 360bbb-3(b)(1), unless the authorization is terminated or revoked.  Performed at [facility], [address] [place], [place] [address]      Labs: Basic Metabolic Panel: Recent Labs  Lab 09/07/21 0923 09/07/21 1347 09/07/21 2355 09/08/21 0549 09/08/21 1611  NA 137 138 137 138 139  K 4.0 4.0 4.0 3.7 3.8  CL 105 104 106 108 110  CO2 25 25 26 24 25   GLUCOSE 111* 201* 159* 136* 125*  BUN 9 8 15 13 16   CREATININE 0.75 0.81 0.82 0.65 0.67  CALCIUM 9.3 8.7* 8.9 9.0 9.0  MG 2.1 1.8 2.1 1.9 2.0  PHOS 3.9 2.6 2.7 3.6 2.3*   Liver Function Tests: Recent Labs  Lab 09/07/21 0811 09/07/21 1347  AST 19 22  ALT 15 15  ALKPHOS 62 64  BILITOT 0.5 0.5  PROT 6.5 6.6  ALBUMIN 3.9 3.9   No results for input(s): LIPASE, AMYLASE in  the last 168 hours. No results for input(s): AMMONIA in the last 168 hours. CBC: Recent Labs  Lab 09/07/21 0811  WBC 5.7  NEUTROABS 4.1  HGB 15.0  HCT 44.3  MCV 88.6  PLT 250   Cardiac Enzymes: No results for input(s): CKTOTAL, CKMB, CKMBINDEX, TROPONINI in the last 168 hours. BNP: BNP (last 3 results) No results for input(s): BNP in the last 8760 hours.  ProBNP (last 3 results) No results for input(s): PROBNP in the last 8760 hours.  CBG: No results for input(s): GLUCAP in the last 168 hours.     Signed:  Desma Maxim MD.  [facility] 09/08/2021, 4:56 PM

## 2021-09-09 ENCOUNTER — Encounter: Payer: Self-pay | Admitting: Internal Medicine

## 2021-09-09 NOTE — Addendum Note (Signed)
Addended by: Gloris Ham on: 09/09/2021 08:54 AM   Modules accepted: Orders

## 2021-09-12 NOTE — Telephone Encounter (Signed)
Spoke with patient and he confirmed that he will hold off on the dose escalation until after his appt on 09/15/21.

## 2021-09-15 ENCOUNTER — Inpatient Hospital Stay (HOSPITAL_BASED_OUTPATIENT_CLINIC_OR_DEPARTMENT_OTHER): Payer: Medicare PPO | Admitting: Internal Medicine

## 2021-09-15 ENCOUNTER — Inpatient Hospital Stay: Payer: Medicare PPO

## 2021-09-15 ENCOUNTER — Other Ambulatory Visit: Payer: Self-pay

## 2021-09-15 ENCOUNTER — Encounter: Payer: Self-pay | Admitting: Internal Medicine

## 2021-09-15 ENCOUNTER — Ambulatory Visit: Payer: TRICARE For Life (TFL)

## 2021-09-15 DIAGNOSIS — C9110 Chronic lymphocytic leukemia of B-cell type not having achieved remission: Secondary | ICD-10-CM | POA: Diagnosis not present

## 2021-09-15 DIAGNOSIS — Z8261 Family history of arthritis: Secondary | ICD-10-CM | POA: Insufficient documentation

## 2021-09-15 DIAGNOSIS — Z79899 Other long term (current) drug therapy: Secondary | ICD-10-CM | POA: Diagnosis not present

## 2021-09-15 DIAGNOSIS — Z9049 Acquired absence of other specified parts of digestive tract: Secondary | ICD-10-CM | POA: Diagnosis not present

## 2021-09-15 DIAGNOSIS — F17210 Nicotine dependence, cigarettes, uncomplicated: Secondary | ICD-10-CM | POA: Diagnosis not present

## 2021-09-15 DIAGNOSIS — Z818 Family history of other mental and behavioral disorders: Secondary | ICD-10-CM | POA: Diagnosis not present

## 2021-09-15 DIAGNOSIS — R5383 Other fatigue: Secondary | ICD-10-CM | POA: Insufficient documentation

## 2021-09-15 DIAGNOSIS — Z8249 Family history of ischemic heart disease and other diseases of the circulatory system: Secondary | ICD-10-CM | POA: Diagnosis not present

## 2021-09-15 DIAGNOSIS — Z5112 Encounter for antineoplastic immunotherapy: Secondary | ICD-10-CM | POA: Insufficient documentation

## 2021-09-15 LAB — CBC WITH DIFFERENTIAL/PLATELET
Abs Immature Granulocytes: 0.05 10*3/uL (ref 0.00–0.07)
Basophils Absolute: 0.1 10*3/uL (ref 0.0–0.1)
Basophils Relative: 1 %
Eosinophils Absolute: 0.1 10*3/uL (ref 0.0–0.5)
Eosinophils Relative: 2 %
HCT: 44.7 % (ref 39.0–52.0)
Hemoglobin: 15.1 g/dL (ref 13.0–17.0)
Immature Granulocytes: 1 %
Lymphocytes Relative: 14 %
Lymphs Abs: 1 10*3/uL (ref 0.7–4.0)
MCH: 30.5 pg (ref 26.0–34.0)
MCHC: 33.8 g/dL (ref 30.0–36.0)
MCV: 90.3 fL (ref 80.0–100.0)
Monocytes Absolute: 0.5 10*3/uL (ref 0.1–1.0)
Monocytes Relative: 8 %
Neutro Abs: 5.1 10*3/uL (ref 1.7–7.7)
Neutrophils Relative %: 74 %
Platelets: 247 10*3/uL (ref 150–400)
RBC: 4.95 MIL/uL (ref 4.22–5.81)
RDW: 14.2 % (ref 11.5–15.5)
WBC: 6.8 10*3/uL (ref 4.0–10.5)
nRBC: 0 % (ref 0.0–0.2)

## 2021-09-15 LAB — BASIC METABOLIC PANEL
Anion gap: 6 (ref 5–15)
BUN: 12 mg/dL (ref 8–23)
CO2: 28 mmol/L (ref 22–32)
Calcium: 9.3 mg/dL (ref 8.9–10.3)
Chloride: 102 mmol/L (ref 98–111)
Creatinine, Ser: 0.87 mg/dL (ref 0.61–1.24)
GFR, Estimated: >60 mL/min (ref >60)
Glucose, Bld: 86 mg/dL (ref 70–99)
Potassium: 3.9 mmol/L (ref 3.5–5.1)
Sodium: 136 mmol/L (ref 135–145)

## 2021-09-15 LAB — COMPREHENSIVE METABOLIC PANEL
ALT: 17 U/L (ref 0–44)
AST: 16 U/L (ref 15–41)
Albumin: 3.9 g/dL (ref 3.5–5.0)
Alkaline Phosphatase: 67 U/L (ref 38–126)
Anion gap: 7 (ref 5–15)
BUN: 13 mg/dL (ref 8–23)
CO2: 28 mmol/L (ref 22–32)
Calcium: 9.4 mg/dL (ref 8.9–10.3)
Chloride: 102 mmol/L (ref 98–111)
Creatinine, Ser: 0.9 mg/dL (ref 0.61–1.24)
GFR, Estimated: >60 mL/min (ref >60)
Glucose, Bld: 124 mg/dL — ABNORMAL HIGH (ref 70–99)
Potassium: 4 mmol/L (ref 3.5–5.1)
Sodium: 137 mmol/L (ref 135–145)
Total Bilirubin: 0.6 mg/dL (ref 0.3–1.2)
Total Protein: 6.7 g/dL (ref 6.5–8.1)

## 2021-09-15 LAB — URIC ACID
Uric Acid, Serum: 2 mg/dL — ABNORMAL LOW (ref 3.7–8.6)
Uric Acid, Serum: 1.9 mg/dL — ABNORMAL LOW (ref 3.7–8.6)

## 2021-09-15 LAB — MAGNESIUM
Magnesium: 1.9 mg/dL (ref 1.7–2.4)
Magnesium: 2 mg/dL (ref 1.7–2.4)

## 2021-09-15 LAB — PHOSPHORUS
Phosphorus: 3.6 mg/dL (ref 2.5–4.6)
Phosphorus: 3.9 mg/dL (ref 2.5–4.6)

## 2021-09-15 LAB — LACTATE DEHYDROGENASE
LDH: 130 U/L (ref 98–192)
LDH: 130 U/L (ref 98–192)

## 2021-09-15 NOTE — Progress Notes (Signed)
Patient here for oncology follow-up appointment, expresses no complaints or concerns at this time.    

## 2021-09-15 NOTE — Progress Notes (Signed)
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   AUG 2021-Head and neck: The brain demonstrates symmetric appearing diffuse physiologic uptake without focal finding to suggest abnormality. In the left posterior and medial maxillary cavity, there is an area of mucoid deposition seen within SUV max of 28.8. This was seen in the previous study of November. The specific bony erosion, clear-cut soft tissue lesion is not detected. This probably represents a chronic sinus disease focus. The bilateral optic nerves and extraocular muscles to the largest nodes are seen on axial image 98. 1 now demonstrates a short axis measurement of 3.3 cm and the other more medially, a short axis measurement of 2.5 cm. Demonstrate symmetric increased uptake. The left, much greater than right, jugulodigastric and supraclavicular nodes have increased significantly in size and avidity since the previous study. Both of these lymph nodes demonstrate an SUV max of 3.9. No largest lymph node in the right subclavicular space is seen on axial image 104, demonstrating an SUV max of 3.3, and a short axis measurement of 1.4 cm.   On axial image 102, a right paratracheal lymph node is again identified with increased avidity, located immediately posterior to the right thyroid pole. This  area was noted previously, but now increased avidity is seen, with an SUV max of 8.6 in this area. Multiple smaller, and less avid lymph nodes scattered throughout the left greater than right neck are seen, as well as supraclavicular and infraclavicular areas.   Thorax: Significant axillary lymph nodes have increased significantly in size. These are also left greater than right, with the largest on the left seen on image #114 with a short axis measurement of 2.2 cm and an SUV max of 4.3. The largest on the right as seen on image 111, demonstrating a short axis of 1.4 cm and an SUV max of 4.0. Too numerous to count smaller and similarly avid lymph nodes in both axillae are also noted. These are all larger than previously seen and in general, more avid.  Numerous small AP window and lesser mediastinal lymph nodes are seen with minimal increased avidity, appearing similar in size and avidity to the previous study. No other significant mediastinal adenopathy. No hilar adenopathy detected. These likely also represent lymphoproliferative disease.  Hyperinflation of COPD, bibasilar dependent atelectasis, some paucity of lung tissue and biapical scarring are noted without other focal pulmonary mass or nodule, or acute pulmonary parenchymal disease. Physiologic uptake in the left heart is seen, as expected. Small hiatal hernia is again identified.   Abdomen pelvis: Physiologic uptake in the liver, gut and urinary tract is again noted. No focal area of asymmetric uptake in the structures is seen. The spleen also demonstrates generalized increased uptake and stable splenomegaly, up to 14.2 cm. On image 198, a preaortic lymph node is detected of increased size since the previous study, now measuring up to 1.2 cm short axis.  Several  large lymph nodes on image 203 of the mesentery are noted. The largest of these, on image 201 and ureters up to 1.8 cm short axis. Despite the fact that measurable avidity is not detected in  these are the surrounding lymph nodes, these are doubtless involved in the lymphoproliferative disease described, and appear larger. Multiple smaller lymph nodes scattered throughout the mesentery are also seen.  Grossly enlarged right greater than left common iliac chain lymph nodes are again seen, throughout the pelvis and into the inguinal areas bilaterally. The most avid on the right is seen on image #260 to, demonstrates a short axis of 2.5 cm, and an SUV max of 4.9. The largest on the left is identified image #272, demonstrates a short axis of 2.0 cm, and an SUV max of 4.7. These are similar to the previously noted values.  Right greater than left inguinal adenopathy is seen, with the largest lymph node seen on the right image 276, demonstrating a short axis of 1.5 cm and an SUV max of 3.9. This is larger in size and avidity than previously seen. There are bilateral enlarged lymph nodes associated with the external iliac vessels on image 277. These measure 2.0 cm on the right in short axis, with an SUV max of 3.9.  On the left, image 278, the external iliac lymph node is larger at 1.8 cm short axis, but with an SUV max of only 3.8. Lesser avid and smaller lymph nodes on both sides are also detected.   Musculoskeletal: Scattered, insignificant uptake is detected in the muscles especially in the bilateral joints, without focal finding to suggest lymphoproliferative disease.   IMPRESSIONS:  Significant increase in numbers, short axis size, and to a large extent, avidity -in the lymph nodes of the left greater than right neck, supraclavicular and infraclavicular areas, bilateral axillary lymph nodes and common iliac chain, external iliac chain and inguinal lymph nodes, consistent with worsening lymphoproliferative disease. The mediastinal lymph nodes seen are still present and unchanged in size or [significantly] in avidity.    [place]- Bx- SLL/CLL.July 2022-Discussed with Dr. Asquith-oncologist,  [place]-reviewed the pathology of the lymph node biopsy in 2021-positive for SLL; negative for cyclin D1/1114 translocation.    # July 20th, 2022- GAZYVA #1 Aletha Halim plan ading VENATOCLAX after 2 cycles/de-bulking]  # Lung cancer screening- on [facility]  # SURVIVORSHIP:   # GENETICS:   DIAGNOSIS:   STAGE:         ;  GOALS:  CURRENT/MOST RECENT THERAPY :       CLL (chronic lymphocytic leukemia) ([place])  10/29/2019 Initial Diagnosis   CLL (chronic lymphocytic leukemia) ([place])   06/15/2021 -  Chemotherapy   Patient is on Treatment Plan : LYMPHOMA CLL/SLL Venetoclax + Obinutuzumab q28d        INTERVAL HISTORY: Ambulating independently.  Accompanied by his wife.  A pleasant 70 year-old male patient with CLL/SLL -stage IV bulky adenopathy-currently s/p cycle #4-he is here to proceed with venetoclax cycle number 1 day 8 today.  Patient continues to notice significant improvement adenopathy and underarm/neck.  No nausea no vomiting no fevers no chills.  Review of Systems  Constitutional:  Positive for malaise/fatigue. Negative for chills, diaphoresis, fever and weight loss.  HENT:  Negative for nosebleeds and sore throat.   Eyes:  Negative for double vision.  Respiratory:  Negative for cough, hemoptysis, sputum production, shortness of breath and wheezing.   Cardiovascular:  Negative for chest pain, palpitations, orthopnea and leg swelling.  Gastrointestinal:  Negative for  abdominal pain, blood in stool, constipation, diarrhea, heartburn, melena, nausea and vomiting.  Genitourinary:  Negative for dysuria, frequency and urgency.  Musculoskeletal:  Negative for back pain and joint pain.  Skin: Negative.  Negative for itching and rash.  Neurological:  Negative for dizziness, tingling, focal weakness, weakness and headaches.  Endo/Heme/Allergies:  Does not bruise/bleed easily.  Psychiatric/Behavioral:  Negative for depression. The patient is not nervous/anxious and does not have insomnia.      PAST MEDICAL HISTORY :  Past Medical History:  Diagnosis Date   Atherosclerosis of coronary artery 09/28/2016   Chest CT Nov 2017   Elevated C-reactive protein (CRP)    Elevated PSA    Fractured sternum 1999   Herpes    History of rib fracture 1999   multiple   Hyperlipidemia    Psoriasis    Rheumatoid factor positive April 2015   Rosacea    Thoracic aortic atherosclerosis ([place]) 09/28/2016   Chest CT Nov 2017   Tobacco abuse    Vitamin D deficiency disease     PAST SURGICAL HISTORY :   Past Surgical History:  Procedure Laterality Date   APPENDECTOMY  1974   PORTA CATH INSERTION N/A 06/06/2021   Procedure: PORTA CATH INSERTION;  Surgeon: Algernon Huxley, MD;  Location: [facility];  Service: Cardiovascular;  Laterality: N/A;   SHOULDER ARTHROSCOPY  10/23/2016   Procedure: ARTHROSCOPY SHOULDER WITH DISTAL CLAVICLE EXCISION, PARTIAL ACROMIONECTOMY, AND DEBRIDEMENT;  Surgeon: Earnestine Leys, MD;  Location: [facility];  Service: Orthopedics;;   TIBIA FRACTURE SURGERY Left 2004   titanium rod, ORIF    FAMILY HISTORY :   Family History  Problem Relation Age of Onset   Heart disease Father    Osteoarthritis Father    Alzheimer's disease Father    Cancer Neg Hx    Diabetes Neg Hx    Hypertension Neg Hx    Stroke Neg Hx    COPD Neg Hx     SOCIAL HISTORY:   Social History   Tobacco Use   Smoking status: Every Day    Packs/day: 0.50    Years: 45.00    Pack years: 22.50    Types: Cigarettes   Smokeless tobacco: Never   Tobacco comments:    11/6- currently smoking 5 cigarettes a day   Vaping Use   Vaping Use: Never used  Substance Use Topics   Alcohol use: Yes    Alcohol/week: 3.0 standard drinks    Types: 3 Glasses of wine per week    Comment: a couple glasses of wine a week   Drug use: No    ALLERGIES:  is allergic to gluten meal.  MEDICATIONS:  Current Outpatient Medications  Medication Sig Dispense Refill   acetaminophen (TYLENOL) 325 MG tablet  Take by mouth every 6 (six) hours as needed.     acyclovir (ZOVIRAX) 400 MG tablet Take 1 tablet (400 mg total) by mouth 2 (two) times daily. 60 tablet 4   allopurinol (ZYLOPRIM) 300 MG tablet Take 1 tablet (300 mg total) by mouth 2 (two) times daily. 120 tablet 3   diphenhydrAMINE (BENADRYL) 25 MG tablet Take 25 mg by mouth every 6 (six) hours as needed.     lidocaine-prilocaine (EMLA) cream Apply 30 -45 mins prior to port access. 30 g 0   montelukast (SINGULAIR) 10 MG tablet Take 1 tablet (10 mg total) by mouth at bedtime. Start 2 days prior to infusion. Do not take on the day of infusion. [place]  tablet 0   venetoclax (VENCLEXTA) 10 & 50 & 100 MG Starter Pack Take by mouth daily. Take 20 mg for 7 days, then 50 mg daily x 7d, then 100 mg daily x 7d, then 200 mg daily x 7d. Take with food & water. 42 tablet 0   No current facility-administered medications for this visit.    PHYSICAL EXAMINATION: ECOG PERFORMANCE STATUS: 0 - Asymptomatic  BP 105/77 (BP Location: Left Arm, Patient Position: Sitting)   Pulse 77   Temp (!) 96 F (35.6 C) (Tympanic)   Resp 17   Wt 162 lb (73.5 kg)   SpO2 98%   BMI 26.96 kg/m   Filed Weights   09/15/21 0842  Weight: 162 lb (73.5 kg)     Physical Exam Constitutional:      Comments: Alone.  Ambulating independently.  Bulky adenopathy noted left supraclavicular region [8cm]. ~7 cm lymph node felt in the left underarm; 5cm lymph node in the right underarm.  ~3 cm  lymph node felt in the right and left groin. [Clinically softer improving]  HENT:     Head: Normocephalic and atraumatic.     Mouth/Throat:     Pharynx: No oropharyngeal exudate.  Eyes:     Pupils: Pupils are equal, round, and reactive to light.  Cardiovascular:     Rate and Rhythm: Normal rate and regular rhythm.  Pulmonary:     Effort: No respiratory distress.     Breath sounds: No wheezing.  Abdominal:     General: Bowel sounds are normal. There is no distension.     Palpations: Abdomen  is soft. There is no mass.     Tenderness: There is no abdominal tenderness. There is no guarding or rebound.  Musculoskeletal:        General: No tenderness. Normal range of motion.     Cervical back: Normal range of motion and neck supple.  Skin:    General: Skin is warm.  Neurological:     Mental Status: He is alert and oriented to person, place, and time.  Psychiatric:        Mood and Affect: Affect normal.    LABORATORY DATA:  I have reviewed the data as listed    Component Value Date/Time   NA 136 09/15/2021 1402   K 3.9 09/15/2021 1402   CL 102 09/15/2021 1402   CO2 28 09/15/2021 1402   GLUCOSE 86 09/15/2021 1402   BUN 12 09/15/2021 1402   CREATININE 0.87 09/15/2021 1402   CREATININE 0.84 10/06/2019 0000   CALCIUM 9.3 09/15/2021 1402   PROT 6.7 09/15/2021 0800   ALBUMIN 3.9 09/15/2021 0800   AST 16 09/15/2021 0800   ALT 17 09/15/2021 0800   ALKPHOS 67 09/15/2021 0800   BILITOT 0.6 09/15/2021 0800   GFRNONAA >60 09/15/2021 1402   GFRNONAA 91 10/06/2019 0000   GFRAA >60 12/26/2019 0842   GFRAA 106 10/06/2019 0000    No results found for: SPEP, UPEP  Lab Results  Component Value Date   WBC 6.8 09/15/2021   NEUTROABS 5.1 09/15/2021   HGB 15.1 09/15/2021   HCT 44.7 09/15/2021   MCV 90.3 09/15/2021   PLT 247 09/15/2021      Chemistry      Component Value Date/Time   NA 136 09/15/2021 1402   K 3.9 09/15/2021 1402   CL 102 09/15/2021 1402   CO2 28 09/15/2021 1402   BUN 12 09/15/2021 1402   CREATININE 0.87 09/15/2021 1402   CREATININE  0.84 10/06/2019 0000      Component Value Date/Time   CALCIUM 9.3 09/15/2021 1402   ALKPHOS 67 09/15/2021 0800   AST 16 09/15/2021 0800   ALT 17 09/15/2021 0800   BILITOT 0.6 09/15/2021 0800       RADIOGRAPHIC STUDIES: I have personally reviewed the radiological images as listed and agreed with the findings in the report. No results found.   ASSESSMENT & PLAN:   .CLL (chronic lymphocytic leukemia)  ([place]) #SLL/CLL- PET scan- June 2022- Marked progression of lymphoproliferative process -Axillary /neck retroperitoneal adenopathy.     Gazyva s/p #2 ONLY. SEP 9th, 2022- Significant interval decrease in size of numerous enlarged, bulky lymph nodes in the chest, abdomen, and pelvis, consistent with treatment response of lymphoma. ADDED Venetoclax with cycle#4.   #s/p cycle #4 of Gazyva; Today- D#8-  Labs today reviewed;    Patient's phosphorus/magnesium/uric acid/chemistries within normal limits. acceptable for VEnatoclax ramp up to 50 mg/day today-again extensively educated the patient regarding risk of tumor lysis.  Recommend a gallon of water the next 24 hours.  Reiterated with the patient's wife.  She acknowledges also.  We will repeat tumor lysis labs this afternoon at 2:00; also tomorrow.  Patient/family is reliable.  Patient wife understands that if labs abnormal-patient might need admission to the hospital.   # Continue  shingles/TLS prophylaxis: Acyclovir/allopurinol- STABLE>   # EVUSHLED: s/p  COVID EVUSHELD PROPHYLAXIS  # Mediport placement: No malfunction noted- STABLE.   # DISPOSITION: # labs today- at 2 PM- order Mag; phos; uric acid; LDH; BMP  # on 10/21- early AM- order Mag; phos; uric acid; LDH; BMP   # keep appt as planned on- on nov 9th  MD; labs- cbc/cmp; uric acid;mag' phos LDH- Gazyva- Dr.B  Addendum: Patient's afternoon labs/3 PM-post venetoclax 50 mg dose [~10 AM]-normal; no abnormalities noted.  Sent a Therapist, music.  Encouraged continued drinking of fluids.           Orders Placed This Encounter  Procedures   Magnesium    Standing Status:   Future    Number of Occurrences:   1    Standing Expiration Date:   09/15/2022   Lactate dehydrogenase    Standing Status:   Future    Number of Occurrences:   1    Standing Expiration Date:   75/91/6384   Basic metabolic panel    Standing Status:   Future    Number of Occurrences:   1    Standing Expiration  Date:   09/15/2022   Phosphorus    Standing Status:   Future    Number of Occurrences:   1    Standing Expiration Date:   09/15/2022   Uric acid    Standing Status:   Future    Number of Occurrences:   1    Standing Expiration Date:   09/15/2022   Magnesium    Standing Status:   Future    Standing Expiration Date:   09/15/2022   Phosphorus    Standing Status:   Future    Standing Expiration Date:   09/15/2022   Uric acid    Standing Status:   Future    Standing Expiration Date:   09/15/2022   Lactate dehydrogenase    Standing Status:   Future    Standing Expiration Date:   66/59/9357   Basic metabolic panel    Standing Status:   Future    Standing Expiration Date:   09/15/2022  CBC with Differential    Standing Status:   Future    Standing Expiration Date:   09/15/2022   Comprehensive metabolic panel    Standing Status:   Future    Standing Expiration Date:   09/15/2022   Lactate dehydrogenase    Standing Status:   Future    Standing Expiration Date:   09/15/2022   Magnesium    Standing Status:   Future    Standing Expiration Date:   09/15/2022   Uric acid    Standing Status:   Future    Standing Expiration Date:   09/15/2022   Phosphorus    Standing Status:   Future    Standing Expiration Date:   09/15/2022           Cammie Sickle, MD 09/15/2021 6:33 PM

## 2021-09-15 NOTE — Assessment & Plan Note (Addendum)
#  SLL/CLL- PET scan- June 2022- Marked progression of lymphoproliferative process -Axillary /neck retroperitoneal adenopathy.     Gazyva s/p #2 ONLY. SEP 9th, 2022- Significant interval decrease in size of numerous enlarged, bulky lymph nodes in the chest, abdomen, and pelvis, consistent with treatment response of lymphoma. ADDED Venetoclax with cycle#4.   #s/p cycle #4 of Gazyva; Today- D#8-  Labs today reviewed;    Patient's phosphorus/magnesium/uric acid/chemistries within normal limits. acceptable for VEnatoclax ramp up to 50 mg/day today-again extensively educated the patient regarding risk of tumor lysis.  Recommend a gallon of water the next 24 hours.  Reiterated with the patient's wife.  She acknowledges also.  We will repeat tumor lysis labs this afternoon at 2:00; also tomorrow.  Patient/family is reliable.  Patient wife understands that if labs abnormal-patient might need admission to the hospital.   # Continue  shingles/TLS prophylaxis: Acyclovir/allopurinol- STABLE>   # EVUSHLED: s/p  COVID EVUSHELD PROPHYLAXIS  # Mediport placement: No malfunction noted- STABLE.   # DISPOSITION: # labs today- at 2 PM- order Mag; phos; uric acid; LDH; BMP  # on 10/21- early AM- order Mag; phos; uric acid; LDH; BMP   # keep appt as planned on- on nov 9th  MD; labs- cbc/cmp; uric acid;mag' phos LDH- Gazyva- Dr.B  Addendum: Patient's afternoon labs/3 PM-post venetoclax 50 mg dose [~10 AM]-normal; no abnormalities noted.  Sent a Therapist, music.  Encouraged continued drinking of fluids.

## 2021-09-16 ENCOUNTER — Inpatient Hospital Stay: Payer: Medicare PPO

## 2021-09-16 ENCOUNTER — Encounter: Payer: Self-pay | Admitting: Internal Medicine

## 2021-09-16 DIAGNOSIS — F17210 Nicotine dependence, cigarettes, uncomplicated: Secondary | ICD-10-CM | POA: Diagnosis not present

## 2021-09-16 DIAGNOSIS — Z818 Family history of other mental and behavioral disorders: Secondary | ICD-10-CM | POA: Diagnosis not present

## 2021-09-16 DIAGNOSIS — C9110 Chronic lymphocytic leukemia of B-cell type not having achieved remission: Secondary | ICD-10-CM

## 2021-09-16 DIAGNOSIS — Z5112 Encounter for antineoplastic immunotherapy: Secondary | ICD-10-CM | POA: Diagnosis not present

## 2021-09-16 DIAGNOSIS — R5383 Other fatigue: Secondary | ICD-10-CM | POA: Diagnosis not present

## 2021-09-16 DIAGNOSIS — Z79899 Other long term (current) drug therapy: Secondary | ICD-10-CM | POA: Diagnosis not present

## 2021-09-16 DIAGNOSIS — Z8249 Family history of ischemic heart disease and other diseases of the circulatory system: Secondary | ICD-10-CM | POA: Diagnosis not present

## 2021-09-16 DIAGNOSIS — Z8261 Family history of arthritis: Secondary | ICD-10-CM | POA: Diagnosis not present

## 2021-09-16 DIAGNOSIS — Z9049 Acquired absence of other specified parts of digestive tract: Secondary | ICD-10-CM | POA: Diagnosis not present

## 2021-09-16 LAB — BASIC METABOLIC PANEL
Anion gap: 6 (ref 5–15)
BUN: 9 mg/dL (ref 8–23)
CO2: 28 mmol/L (ref 22–32)
Calcium: 9.1 mg/dL (ref 8.9–10.3)
Chloride: 101 mmol/L (ref 98–111)
Creatinine, Ser: 0.85 mg/dL (ref 0.61–1.24)
GFR, Estimated: >60 mL/min (ref >60)
Glucose, Bld: 109 mg/dL — ABNORMAL HIGH (ref 70–99)
Potassium: 3.7 mmol/L (ref 3.5–5.1)
Sodium: 135 mmol/L (ref 135–145)

## 2021-09-16 LAB — PHOSPHORUS: Phosphorus: 4.1 mg/dL (ref 2.5–4.6)

## 2021-09-16 LAB — LACTATE DEHYDROGENASE: LDH: 133 U/L (ref 98–192)

## 2021-09-16 LAB — MAGNESIUM: Magnesium: 2.1 mg/dL (ref 1.7–2.4)

## 2021-09-16 LAB — URIC ACID: Uric Acid, Serum: 2.1 mg/dL — ABNORMAL LOW (ref 3.7–8.6)

## 2021-09-21 ENCOUNTER — Telehealth: Payer: Self-pay | Admitting: Pharmacist

## 2021-09-22 NOTE — Telephone Encounter (Signed)
Oral Chemotherapy Pharmacist Encounter   Spoke with Andres Mullins on 09/21/21. He is feeling good and remaining hydrated. He will continue his dose escalation by going up to 145m/daily today 09/22/21.  Andres Mullins to call with any questions or concerns.  ADarl Pikes PharmD, BCPS, BCOP, CPP Hematology/Oncology Clinical Pharmacist [phone] 09/22/2021 12:57 PM

## 2021-09-29 ENCOUNTER — Other Ambulatory Visit (HOSPITAL_COMMUNITY): Payer: Self-pay

## 2021-09-29 ENCOUNTER — Inpatient Hospital Stay: Payer: Medicare PPO | Attending: Internal Medicine

## 2021-09-29 ENCOUNTER — Inpatient Hospital Stay (HOSPITAL_BASED_OUTPATIENT_CLINIC_OR_DEPARTMENT_OTHER): Payer: Medicare PPO | Admitting: Oncology

## 2021-09-29 ENCOUNTER — Other Ambulatory Visit: Payer: Self-pay | Admitting: Internal Medicine

## 2021-09-29 ENCOUNTER — Inpatient Hospital Stay: Payer: Medicare PPO

## 2021-09-29 ENCOUNTER — Encounter: Payer: Self-pay | Admitting: Oncology

## 2021-09-29 ENCOUNTER — Other Ambulatory Visit: Payer: Self-pay

## 2021-09-29 ENCOUNTER — Ambulatory Visit
Admission: RE | Admit: 2021-09-29 | Discharge: 2021-09-29 | Disposition: A | Discharge status: Home / Self Care | Payer: Medicare PPO | Attending: Oncology | Admitting: Oncology

## 2021-09-29 ENCOUNTER — Encounter: Payer: Self-pay | Admitting: Internal Medicine

## 2021-09-29 ENCOUNTER — Telehealth: Payer: Self-pay | Admitting: *Deleted

## 2021-09-29 ENCOUNTER — Ambulatory Visit
Admission: RE | Admit: 2021-09-29 | Discharge: 2021-09-29 | Disposition: A | Discharge status: Home / Self Care | Payer: Medicare PPO | Source: Ambulatory Visit | Attending: Oncology | Admitting: Oncology

## 2021-09-29 VITALS — BP 118/86 | HR 107 | Temp 96.3°F | Resp 20

## 2021-09-29 DIAGNOSIS — C9110 Chronic lymphocytic leukemia of B-cell type not having achieved remission: Secondary | ICD-10-CM

## 2021-09-29 DIAGNOSIS — B370 Candidal stomatitis: Secondary | ICD-10-CM

## 2021-09-29 DIAGNOSIS — R0602 Shortness of breath: Secondary | ICD-10-CM

## 2021-09-29 DIAGNOSIS — Z79899 Other long term (current) drug therapy: Secondary | ICD-10-CM | POA: Insufficient documentation

## 2021-09-29 DIAGNOSIS — J029 Acute pharyngitis, unspecified: Secondary | ICD-10-CM | POA: Insufficient documentation

## 2021-09-29 DIAGNOSIS — R5383 Other fatigue: Secondary | ICD-10-CM | POA: Insufficient documentation

## 2021-09-29 DIAGNOSIS — J90 Pleural effusion, not elsewhere classified: Secondary | ICD-10-CM | POA: Diagnosis not present

## 2021-09-29 LAB — COMPREHENSIVE METABOLIC PANEL
ALT: 17 U/L (ref 0–44)
AST: 17 U/L (ref 15–41)
Albumin: 3.2 g/dL — ABNORMAL LOW (ref 3.5–5.0)
Alkaline Phosphatase: 78 U/L (ref 38–126)
Anion gap: 10 (ref 5–15)
BUN: 13 mg/dL (ref 8–23)
CO2: 25 mmol/L (ref 22–32)
Calcium: 8.8 mg/dL — ABNORMAL LOW (ref 8.9–10.3)
Chloride: 97 mmol/L — ABNORMAL LOW (ref 98–111)
Creatinine, Ser: 0.77 mg/dL (ref 0.61–1.24)
GFR, Estimated: >60 mL/min (ref >60)
Glucose, Bld: 106 mg/dL — ABNORMAL HIGH (ref 70–99)
Potassium: 3.7 mmol/L (ref 3.5–5.1)
Sodium: 132 mmol/L — ABNORMAL LOW (ref 135–145)
Total Bilirubin: 0.8 mg/dL (ref 0.3–1.2)
Total Protein: 6.9 g/dL (ref 6.5–8.1)

## 2021-09-29 LAB — CBC WITH DIFFERENTIAL/PLATELET
Abs Immature Granulocytes: 0.09 10*3/uL — ABNORMAL HIGH (ref 0.00–0.07)
Basophils Absolute: 0.1 10*3/uL (ref 0.0–0.1)
Basophils Relative: 0 %
Eosinophils Absolute: 0 10*3/uL (ref 0.0–0.5)
Eosinophils Relative: 0 %
HCT: 39.5 % (ref 39.0–52.0)
Hemoglobin: 13.8 g/dL (ref 13.0–17.0)
Immature Granulocytes: 1 %
Lymphocytes Relative: 8 %
Lymphs Abs: 1 10*3/uL (ref 0.7–4.0)
MCH: 30.9 pg (ref 26.0–34.0)
MCHC: 34.9 g/dL (ref 30.0–36.0)
MCV: 88.4 fL (ref 80.0–100.0)
Monocytes Absolute: 1.1 10*3/uL — ABNORMAL HIGH (ref 0.1–1.0)
Monocytes Relative: 9 %
Neutro Abs: 10.3 10*3/uL — ABNORMAL HIGH (ref 1.7–7.7)
Neutrophils Relative %: 82 %
Platelets: 408 10*3/uL — ABNORMAL HIGH (ref 150–400)
RBC: 4.47 MIL/uL (ref 4.22–5.81)
RDW: 13.8 % (ref 11.5–15.5)
WBC: 12.5 10*3/uL — ABNORMAL HIGH (ref 4.0–10.5)
nRBC: 0 % (ref 0.0–0.2)

## 2021-09-29 LAB — PHOSPHORUS: Phosphorus: 4.2 mg/dL (ref 2.5–4.6)

## 2021-09-29 LAB — LACTATE DEHYDROGENASE: LDH: 145 U/L (ref 98–192)

## 2021-09-29 LAB — MAGNESIUM: Magnesium: 2.2 mg/dL (ref 1.7–2.4)

## 2021-09-29 LAB — URIC ACID: Uric Acid, Serum: 2.7 mg/dL — ABNORMAL LOW (ref 3.7–8.6)

## 2021-09-29 MED ORDER — LEVOFLOXACIN 500 MG PO TABS: 500.0000 mg | ORAL_TABLET | Freq: Every day | ORAL | 0 refills | Status: DC

## 2021-09-29 MED ORDER — NYSTATIN 100000 UNIT/ML MT SUSP: 5.0000 mL | Freq: Four times a day (QID) | OROMUCOSAL | 0 refills | Status: DC

## 2021-09-29 MED ORDER — SODIUM CHLORIDE 0.9% FLUSH
10.0000 mL | Freq: Once | INTRAVENOUS | Status: DC
  Filled 2021-09-29: qty 10

## 2021-09-29 MED ORDER — SODIUM CHLORIDE 0.9 % IV SOLN
INTRAVENOUS | Status: DC
  Filled 2021-09-29: qty 250

## 2021-09-29 MED ORDER — HEPARIN SOD (PORK) LOCK FLUSH 100 UNIT/ML IV SOLN
500.0000 [IU] | Freq: Once | INTRAVENOUS | Status: AC
  Administered 2021-09-29: 500 [IU] via INTRAVENOUS
  Filled 2021-09-29: qty 5

## 2021-09-29 NOTE — Telephone Encounter (Signed)
Would you guys mind going ahead and getting this guy and for lab work this morning?  I do not mind picking at him either to look in his mouth.  I am thinking that is what Dr. Rogue Bussing would like but do not await this afternoon.  Faythe Casa, NP 09/29/2021 10:19 AM

## 2021-09-29 NOTE — Telephone Encounter (Signed)
Left detailed vm - requesting pt to come to [facility] asap for apts in [facility].

## 2021-09-29 NOTE — Telephone Encounter (Signed)
Dr. Ellison Hughs- please advise on whether RF is appropriate at this time -given the acute symptom mgmt apt today in clinic.

## 2021-09-29 NOTE — Addendum Note (Signed)
Addended by: Faythe Casa E on: 09/29/2021 02:34 PM   Modules accepted: Orders

## 2021-09-29 NOTE — Telephone Encounter (Signed)
I left a detailed msg- requesting pt to come asap. Apt schedule in epic. Waiting on patient to respond. Dr. B requested labs- pt is a high risk for tumor lysis. Pt will need to see Allyson when he gets here too if adjustments need to be made in his meds.

## 2021-09-29 NOTE — Progress Notes (Addendum)
Symptom Management Consult note [facility]  [phone] [phone]  Patient Care Team: [name], PA-C as PCP - General (Family Medicine) Sanda Klein Satira Anis, MD as Attending Physician (Family Medicine) Brendolyn Patty, MD (Dermatology) Billey Co, MD as Consulting Physician (Urology) Cammie Sickle, MD as Consulting Physician (Hematology and Oncology)   Name of the patient: Andres Mullins  [id number]  1951-02-15   Date of visit: 09/29/2021   Diagnosis- CLL/SLL  Chief complaint/ Reason for visit- Thrush    Heme/Onc history: [name] is a 70 year old male with past medical history significant for celiac disease, arthrosclerosis, hyperlipidemia, tobacco abuse, vitamin D deficiency who was recently diagnosed with CLL and is being treated by Dr. Rogue Bussing.  He is status post cycle #4 of Gazyva and recently started on venetoclax.  He was scheduled to increase venetoclax from 100 to 200 mg this morning but did not because he was not feeling well. He was instructed to be seen in clinic.   Interval history-[name] presents to symptom management clinic today for sore throat, fatigue, appetite changes and shortness of breath for the past 3 days.  Patient said symptoms started after he pulled something in his back approximately one week ago. States he took Flexeril and Norco which were leftover medications that he had on hand with complete relief of his back pain.  Reports feeling tired, weak and short of breath over the past 3 days.  Also developed sore throat.  He has been compliant with his prescribed plaque on tongue and oral mucosa.  Medications.  He did not increase his venetoclax this morning because he did not feel well.  He denies any fevers or recent illness, bleeding or bruising.  Appetite has been low.  He denies chest pain.  Has been having hot flashes several times per day.  Denies a cough.  ECOG FS:1 - Symptomatic but  completely ambulatory   Review of systems- Review of Systems  Constitutional:  Positive for malaise/fatigue. Negative for chills, fever and weight loss.  HENT:  Positive for sore throat. Negative for congestion, ear pain and tinnitus.   Eyes: Negative.  Negative for blurred vision and double vision.  Respiratory:  Positive for shortness of breath. Negative for cough and sputum production.   Cardiovascular: Negative.  Negative for chest pain, palpitations and leg swelling.  Gastrointestinal:  Positive for constipation. Negative for abdominal pain, diarrhea, nausea and vomiting.  Genitourinary:  Negative for dysuria, frequency and urgency.  Musculoskeletal:  Negative for back pain and falls.  Skin: Negative.  Negative for rash.  Neurological:  Positive for weakness. Negative for headaches.  Endo/Heme/Allergies: Negative.  Does not bruise/bleed easily.  Psychiatric/Behavioral: Negative.  Negative for depression. The patient is not nervous/anxious and does not have insomnia.     Current treatment-status post 4 cycles of Gazyva and is currently on 100 mg venetoclax-supposed to increase to 200 mg venetoclax today.  Allergies  Allergen Reactions   Gluten Meal     Gluten intolerance      Past Medical History:  Diagnosis Date   Atherosclerosis of coronary artery 09/28/2016   Chest CT Nov 2017   Elevated C-reactive protein (CRP)    Elevated PSA    Fractured sternum 1999   Herpes    History of rib fracture 1999   multiple   Hyperlipidemia    Psoriasis    Rheumatoid factor positive April 2015   Rosacea    Thoracic aortic atherosclerosis ([place]) 09/28/2016  Chest CT Nov 2017   Tobacco abuse    Vitamin D deficiency disease      Past Surgical History:  Procedure Laterality Date   APPENDECTOMY  1974   PORTA CATH INSERTION N/A 06/06/2021   Procedure: PORTA CATH INSERTION;  Surgeon: Algernon Huxley, MD;  Location: [place] [facility];  Service: Cardiovascular;  Laterality: N/A;   SHOULDER  ARTHROSCOPY  10/23/2016   Procedure: ARTHROSCOPY SHOULDER WITH DISTAL CLAVICLE EXCISION, PARTIAL ACROMIONECTOMY, AND DEBRIDEMENT;  Surgeon: Earnestine Leys, MD;  Location: [facility];  Service: Orthopedics;;   TIBIA FRACTURE SURGERY Left 2004   titanium rod, ORIF    Social History   Socioeconomic History   Marital status: Married    Spouse name: colleen   Number of children: Not on file   Years of education: 12   Highest education level: Bachelor's degree (e.g., BA, AB, BS)  Occupational History   Occupation: retired  Tobacco Use   Smoking status: Every Day    Packs/day: 0.50    Years: 45.00    Pack years: 22.50    Types: Cigarettes   Smokeless tobacco: Never   Tobacco comments:    11/6- currently smoking 5 cigarettes a day   Vaping Use   Vaping Use: Never used  Substance and Sexual Activity   Alcohol use: Yes    Alcohol/week: 3.0 standard drinks    Types: 3 Glasses of wine per week    Comment: a couple glasses of wine a week   Drug use: No   Sexual activity: Yes  Other Topics Concern   Not on file  Social History Narrative   Consumes ~4 cups of coffee/day   Social Determinants of Health   Financial Resource Strain: Not on file  Food Insecurity: Not on file  Transportation Needs: Not on file  Physical Activity: Not on file  Stress: Not on file  Social Connections: Not on file  Intimate Partner Violence: Not on file    Family History  Problem Relation Age of Onset   Heart disease Father    Osteoarthritis Father    Alzheimer's disease Father    Cancer Neg Hx    Diabetes Neg Hx    Hypertension Neg Hx    Stroke Neg Hx    COPD Neg Hx      Current Outpatient Medications:    nystatin (MYCOSTATIN) 100000 UNIT/ML suspension, Take 5 mLs (500,000 Units total) by mouth 4 (four) times daily., Disp: 120 mL, Rfl: 0   acetaminophen (TYLENOL) 325 MG tablet, Take by mouth every 6 (six) hours as needed., Disp: , Rfl:    acyclovir (ZOVIRAX) 400 MG tablet, Take 1 tablet (400  mg total) by mouth 2 (two) times daily., Disp: 60 tablet, Rfl: 4   allopurinol (ZYLOPRIM) 300 MG tablet, Take 1 tablet (300 mg total) by mouth 2 (two) times daily., Disp: 120 tablet, Rfl: 3   diphenhydrAMINE (BENADRYL) 25 MG tablet, Take 25 mg by mouth every 6 (six) hours as needed., Disp: , Rfl:    lidocaine-prilocaine (EMLA) cream, Apply 30 -45 mins prior to port access., Disp: 30 g, Rfl: 0   montelukast (SINGULAIR) 10 MG tablet, Take 1 tablet (10 mg total) by mouth at bedtime. Start 2 days prior to infusion. Do not take on the day of infusion., Disp: 60 tablet, Rfl: 0   venetoclax (VENCLEXTA) 10 & 50 & 100 MG Starter Pack, Take by mouth daily. Take 20 mg for 7 days, then 50 mg daily x 7d, then  100 mg daily x 7d, then 200 mg daily x 7d. Take with food & water., Disp: 42 tablet, Rfl: 0 No current facility-administered medications for this visit.  Facility-Administered Medications Ordered in Other Visits:    0.9 %  sodium chloride infusion, , Intravenous, Continuous, Quandre Polinski E, NP   heparin lock flush 100 unit/mL, 500 Units, Intravenous, Once, Rees Santistevan, Anderson Malta E, NP   sodium chloride flush (NS) 0.9 % injection 10 mL, 10 mL, Intravenous, Once, Jacquelin Hawking, NP  Physical exam:  Vitals:   09/29/21 1141  BP: 118/86  Pulse: (!) 107  Resp: 20  Temp: (!) 96.3 F (35.7 C)  TempSrc: Tympanic  SpO2: 99%   Physical Exam Constitutional:      Appearance: Normal appearance.  HENT:     Head: Normocephalic and atraumatic.     Mouth/Throat:     Comments: plaque on tongue and oral mucosa  Eyes:     Pupils: Pupils are equal, round, and reactive to light.  Cardiovascular:     Rate and Rhythm: Normal rate and regular rhythm.     Heart sounds: Normal heart sounds. No murmur heard. Pulmonary:     Effort: Pulmonary effort is normal.     Breath sounds: Normal breath sounds. No wheezing.  Abdominal:     General: Bowel sounds are normal. There is no distension.     Palpations: Abdomen is  soft.     Tenderness: There is no abdominal tenderness.  Musculoskeletal:        General: Normal range of motion.     Cervical back: Normal range of motion.  Skin:    General: Skin is warm and dry.     Findings: No rash.  Neurological:     Mental Status: He is alert and oriented to person, place, and time.  Psychiatric:        Judgment: Judgment normal.     CMP Latest Ref Rng & Units 09/29/2021  Glucose 70 - 99 mg/dL 106(H)  BUN 8 - 23 mg/dL 13  Creatinine 0.61 - 1.24 mg/dL 0.77  Sodium 135 - 145 mmol/L 132(L)  Potassium 3.5 - 5.1 mmol/L 3.7  Chloride 98 - 111 mmol/L 97(L)  CO2 22 - 32 mmol/L 25  Calcium 8.9 - 10.3 mg/dL 8.8(L)  Total Protein 6.5 - 8.1 g/dL 6.9  Total Bilirubin 0.3 - 1.2 mg/dL 0.8  Alkaline Phos 38 - 126 U/L 78  AST 15 - 41 U/L 17  ALT 0 - 44 U/L 17   CBC Latest Ref Rng & Units 09/29/2021  WBC 4.0 - 10.5 K/uL 12.5(H)  Hemoglobin 13.0 - 17.0 g/dL 13.8  Hematocrit 39.0 - 52.0 % 39.5  Platelets 150 - 400 K/uL 408(H)    No images are attached to the encounter.  No results found.   Assessment and plan- Patient is a 70 y.o. male who presents to symptom management for shortness of breath, fatigue and sore throat.   CLL-status post 4 cycles Gazyva and cycle 1 venetoclax.  Has been tolerating well until about 3 days ago.  He is scheduled to return to clinic on 10/05/2021 to see Dr. Rogue Bussing prior to next cycle.  Recommend he hold his venetoclax until next week given new symptoms.  No signs of tumor lysis syndrome.  Sore throat/change in appetite-secondary to thrush.  On assessment, plaque on tongue and oral mucosa observed.  Patient is currently on acyclovir but will add nystatin swish and swallow.  Prescription sent to pharmacy.  Fatigue/shortness of  breath-he is afebrile while in clinic but slightly tachycardic (HR 106).  Labs show WBC 12.5 and slight bump in platelet count 408.  Likely reactive.  Mild hyponatremia sodium 132 but otherwise CMP and CBC are  unremarkable.  Proceed with IV fluids today and will get chest x-ray to rule out infection verse viral.  He is having hot flashes.  Recommend monitor for now.  We will call patient with results from chest x-ray.  Dr. Rogue Bussing recommends holding his venetoclax till next week.  Disposition-IV fluids today, chest x-ray today. Hold venetoclax.  Return to clinic on 10/05/2021 as scheduled.  Addendum-chest x-ray shows new airspace opacity at the left lung base suspicious for pneumonia.  Given his symptoms, recommend Levaquin 500 mg twice daily x10 days.  Prescription called to pharmacy.  Called to discuss results with patient and/or wife.  Unable to reach either.  Left a voicemail for him to pick up his prescription ASAP.   Visit Diagnosis 1. SOB (shortness of breath)   2. CLL (chronic lymphocytic leukemia) ([place])   3. Oral thrush    Patient expressed understanding and was in agreement with this plan. He also understands that He can call clinic at any time with any questions, concerns, or complaints.   I spent 25 minutes dedicated to the care of this patient (face-to-face and non-face-to-face) on the date of the encounter to include what is described in the assessment and plan.  Thank you for allowing me to participate in the care of this very pleasant patient.   Jacquelin Hawking, NP [place] at [facility] Cell - [phone] Pager- [phone] 09/29/2021 1:08 PM

## 2021-09-29 NOTE — Telephone Encounter (Signed)
Great sounds good.   Faythe Casa, NP 09/29/2021 10:41 AM

## 2021-09-29 NOTE — Progress Notes (Signed)
Should we start antibiotics?  Faythe Casa, NP 09/29/2021 1:45 PM

## 2021-09-29 NOTE — Progress Notes (Signed)
Pt reports mouth pain, fatigue, and shortness of breath for aprox 3 days. States he is drinking well, but endorses poor appetite.

## 2021-10-02 ENCOUNTER — Emergency Department: Payer: Medicare PPO

## 2021-10-02 ENCOUNTER — Other Ambulatory Visit: Payer: Self-pay

## 2021-10-02 ENCOUNTER — Emergency Department
Admission: EM | Admit: 2021-10-02 | Discharge: 2021-10-03 | Disposition: A | Discharge status: Home / Self Care | Payer: Medicare PPO | Attending: Emergency Medicine | Admitting: Emergency Medicine

## 2021-10-02 DIAGNOSIS — J9811 Atelectasis: Secondary | ICD-10-CM | POA: Diagnosis not present

## 2021-10-02 DIAGNOSIS — J189 Pneumonia, unspecified organism: Secondary | ICD-10-CM

## 2021-10-02 DIAGNOSIS — I517 Cardiomegaly: Secondary | ICD-10-CM | POA: Diagnosis not present

## 2021-10-02 DIAGNOSIS — R410 Disorientation, unspecified: Secondary | ICD-10-CM | POA: Diagnosis not present

## 2021-10-02 DIAGNOSIS — J168 Pneumonia due to other specified infectious organisms: Secondary | ICD-10-CM | POA: Diagnosis not present

## 2021-10-02 DIAGNOSIS — R0902 Hypoxemia: Secondary | ICD-10-CM | POA: Diagnosis not present

## 2021-10-02 DIAGNOSIS — F17210 Nicotine dependence, cigarettes, uncomplicated: Secondary | ICD-10-CM | POA: Insufficient documentation

## 2021-10-02 DIAGNOSIS — I3139 Other pericardial effusion (noninflammatory): Secondary | ICD-10-CM

## 2021-10-02 DIAGNOSIS — J90 Pleural effusion, not elsewhere classified: Secondary | ICD-10-CM | POA: Diagnosis not present

## 2021-10-02 DIAGNOSIS — Z20822 Contact with and (suspected) exposure to covid-19: Secondary | ICD-10-CM | POA: Diagnosis not present

## 2021-10-02 DIAGNOSIS — R0789 Other chest pain: Secondary | ICD-10-CM | POA: Diagnosis not present

## 2021-10-02 DIAGNOSIS — R079 Chest pain, unspecified: Secondary | ICD-10-CM | POA: Insufficient documentation

## 2021-10-02 DIAGNOSIS — A419 Sepsis, unspecified organism: Secondary | ICD-10-CM | POA: Diagnosis not present

## 2021-10-02 DIAGNOSIS — R0602 Shortness of breath: Secondary | ICD-10-CM

## 2021-10-02 LAB — CBC WITH DIFFERENTIAL/PLATELET
Abs Immature Granulocytes: 0.31 10*3/uL — ABNORMAL HIGH (ref 0.00–0.07)
Basophils Absolute: 0.1 10*3/uL (ref 0.0–0.1)
Basophils Relative: 0 %
Eosinophils Absolute: 0 10*3/uL (ref 0.0–0.5)
Eosinophils Relative: 0 %
HCT: 41 % (ref 39.0–52.0)
Hemoglobin: 14.4 g/dL (ref 13.0–17.0)
Immature Granulocytes: 1 %
Lymphocytes Relative: 5 %
Lymphs Abs: 1.1 10*3/uL (ref 0.7–4.0)
MCH: 30.4 pg (ref 26.0–34.0)
MCHC: 35.1 g/dL (ref 30.0–36.0)
MCV: 86.5 fL (ref 80.0–100.0)
Monocytes Absolute: 1.9 10*3/uL — ABNORMAL HIGH (ref 0.1–1.0)
Monocytes Relative: 9 %
Neutro Abs: 18.5 10*3/uL — ABNORMAL HIGH (ref 1.7–7.7)
Neutrophils Relative %: 85 %
Platelets: 495 10*3/uL — ABNORMAL HIGH (ref 150–400)
RBC: 4.74 MIL/uL (ref 4.22–5.81)
RDW: 13.9 % (ref 11.5–15.5)
WBC: 21.9 10*3/uL — ABNORMAL HIGH (ref 4.0–10.5)
nRBC: 0 % (ref 0.0–0.2)

## 2021-10-02 LAB — COMPREHENSIVE METABOLIC PANEL
ALT: 13 U/L (ref 0–44)
AST: 15 U/L (ref 15–41)
Albumin: 3 g/dL — ABNORMAL LOW (ref 3.5–5.0)
Alkaline Phosphatase: 59 U/L (ref 38–126)
Anion gap: 11 (ref 5–15)
BUN: 8 mg/dL (ref 8–23)
CO2: 19 mmol/L — ABNORMAL LOW (ref 22–32)
Calcium: 8.9 mg/dL (ref 8.9–10.3)
Chloride: 100 mmol/L (ref 98–111)
Creatinine, Ser: 0.69 mg/dL (ref 0.61–1.24)
GFR, Estimated: >60 mL/min (ref >60)
Glucose, Bld: 119 mg/dL — ABNORMAL HIGH (ref 70–99)
Potassium: 4 mmol/L (ref 3.5–5.1)
Sodium: 130 mmol/L — ABNORMAL LOW (ref 135–145)
Total Bilirubin: 1.1 mg/dL (ref 0.3–1.2)
Total Protein: 6.5 g/dL (ref 6.5–8.1)

## 2021-10-02 LAB — LACTIC ACID, PLASMA
Lactic Acid, Venous: 1.1 mmol/L (ref 0.5–1.9)
Lactic Acid, Venous: 0.9 mmol/L (ref 0.5–1.9)

## 2021-10-02 LAB — RESP PANEL BY RT-PCR (FLU A&B, COVID) ARPGX2
Influenza A by PCR: NEGATIVE
Influenza B by PCR: NEGATIVE
SARS Coronavirus 2 by RT PCR: NEGATIVE

## 2021-10-02 LAB — PROTIME-INR
INR: 1.3 — ABNORMAL HIGH (ref 0.8–1.2)
Prothrombin Time: 15.9 seconds — ABNORMAL HIGH (ref 11.4–15.2)

## 2021-10-02 LAB — TROPONIN I (HIGH SENSITIVITY): Troponin I (High Sensitivity): 5 ng/L (ref <18)

## 2021-10-02 MED ORDER — FENTANYL CITRATE PF 50 MCG/ML IJ SOSY
50.0000 ug | PREFILLED_SYRINGE | Freq: Once | INTRAMUSCULAR | Status: AC
  Administered 2021-10-02: 50 ug via INTRAVENOUS
  Filled 2021-10-02: qty 1

## 2021-10-02 MED ORDER — SODIUM CHLORIDE 0.9 % IV SOLN
2.0000 g | Freq: Once | INTRAVENOUS | Status: AC
  Administered 2021-10-02: 2 g via INTRAVENOUS
  Filled 2021-10-02: qty 2

## 2021-10-02 MED ORDER — LACTATED RINGERS IV BOLUS
1000.0000 mL | Freq: Once | INTRAVENOUS | Status: AC
  Administered 2021-10-02: 1000 mL via INTRAVENOUS

## 2021-10-02 MED ORDER — VANCOMYCIN HCL IN DEXTROSE 1-5 GM/200ML-% IV SOLN
1000.0000 mg | Freq: Once | INTRAVENOUS | Status: AC
  Administered 2021-10-02: 1000 mg via INTRAVENOUS
  Filled 2021-10-02: qty 200

## 2021-10-02 MED ORDER — IOHEXOL 350 MG/ML SOLN
75.0000 mL | Freq: Once | INTRAVENOUS | Status: AC | PRN
  Administered 2021-10-02: 75 mL via INTRAVENOUS

## 2021-10-02 NOTE — ED Provider Notes (Signed)
[facility] Emergency Department Provider Note    ____________________________________________   I have reviewed the triage vital signs and the nursing notes.   HISTORY  Chief Complaint Chest Pain   History limited by: Not Limited   HPI Andres Mullins is a 70 y.o. male who presents to the emergency department today with primary concern for chest pain and shortness of breath.  Patient states that he first started developing this chest pain a couple of days ago.  He describes it as a lump sitting under his sternum.  He did talk to his physician who ordered an x-ray which was consistent with pneumonia.  He was started on oral antibiotics.  He did feel a little bit better yesterday although this morning when he woke up felt worse again.  He has been having associated hot flashes.  Additionally wife feels he has been a little bit more confused and not completely at his mental baseline.  Patient denies any swelling in his arms or legs.  Denies any recent travel.    Records reviewed. Per medical record review patient has a history of CLL, had outpatient x-ray done 3 days ago which showed left lung pneumonia.   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   Welcome to Medicare preventive visit 11/15/2018   Renal lesion 11/12/2018   Prostate enlargement 11/12/2018   Acquired trigger finger 11/12/2018   Trigger  middle finger of right hand 01/02/2018   Positive anti-CCP test 12/12/2017   Rheumatoid factor positive 12/12/2017   Trigger finger of both hands 12/12/2017   Bilateral hand pain 05/18/2017   Preventative health care 05/18/2017   Screen for colon cancer 05/18/2017   Coronary artery calcification 01/18/2017   Supraspinatus tendinitis, left 10/22/2016   Impingement syndrome of shoulder region 10/06/2016   Celiac disease 10/05/2016   Atherosclerosis of coronary artery 09/28/2016   Thoracic aortic atherosclerosis ([place]) 09/28/2016   Personal history of tobacco use, presenting hazards to health 09/26/2016   Lipoma of head 10/19/2015   Tendonitis, Achilles, left 10/14/2015   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   Herpes    Rosacea    Psoriasis    Hyperlipidemia    Vitamin D deficiency disease    Tobacco abuse    Elevated PSA    Elevated C-reactive protein (CRP)     Past Surgical History:  Procedure Laterality Date   APPENDECTOMY  1974   PORTA CATH INSERTION N/A 06/06/2021   Procedure: PORTA CATH INSERTION;  Surgeon: Algernon Huxley, MD;  Location: [place] [facility];  Service: Cardiovascular;  Laterality: N/A;   SHOULDER ARTHROSCOPY  10/23/2016   Procedure: ARTHROSCOPY SHOULDER WITH DISTAL CLAVICLE EXCISION, PARTIAL ACROMIONECTOMY, AND DEBRIDEMENT;  Surgeon: Earnestine Leys, MD;  Location: [facility];  Service: Orthopedics;;   TIBIA FRACTURE SURGERY Left 2004   titanium rod,  ORIF    Prior to Admission medications   Medication Sig Start Date End Date Taking? Authorizing Provider  acetaminophen (TYLENOL) 325 MG tablet Take by mouth every 6 (six) hours as needed.    [provider]  acyclovir (ZOVIRAX) 400 MG tablet Take 1 tablet (400 mg total) by mouth 2 (two) times daily. 05/22/21   Cammie Sickle, MD  allopurinol (ZYLOPRIM) 300 MG tablet Take 1 tablet (300 mg total) by mouth 2 (two) times daily. 07/21/21   Cammie Sickle, MD  diphenhydrAMINE (BENADRYL) 25 MG  tablet Take 25 mg by mouth every 6 (six) hours as needed.    [provider]  levofloxacin (LEVAQUIN) 500 MG tablet Take 1 tablet (500 mg total) by mouth daily. 09/29/21   Jacquelin Hawking, NP  lidocaine-prilocaine (EMLA) cream Apply 30 -45 mins prior to port access. 05/22/21   Cammie Sickle, MD  montelukast (SINGULAIR) 10 MG tablet Take 1 tablet (10 mg total) by mouth at bedtime. Start 2 days prior to infusion. Do not take on the day of infusion. 05/22/21   Cammie Sickle, MD  nystatin (MYCOSTATIN) 100000 UNIT/ML suspension Take 5 mLs (500,000 Units total) by mouth 4 (four) times daily. 09/29/21   Jacquelin Hawking, NP  venetoclax (VENCLEXTA) 10 & 50 & 100 MG Starter Pack Take by mouth daily. Take 20 mg for 7 days, then 50 mg daily x 7d, then 100 mg daily x 7d, then 200 mg daily x 7d. Take with food & water. 08/03/21   Cammie Sickle, MD    Allergies Gluten meal  Family History  Problem Relation Age of Onset   Heart disease Father    Osteoarthritis Father    Alzheimer's disease Father    Cancer Neg Hx    Diabetes Neg Hx    Hypertension Neg Hx    Stroke Neg Hx    COPD Neg Hx     Social History Social History   Tobacco Use   Smoking status: Every Day    Packs/day: 0.50    Years: 45.00    Pack years: 22.50    Types: Cigarettes   Smokeless tobacco: Never   Tobacco comments:    11/6- currently smoking 5 cigarettes a day   Vaping Use   Vaping Use: Never used  Substance Use Topics   Alcohol use: Yes    Alcohol/week: 3.0 standard drinks    Types: 3 Glasses of wine per week    Comment: a couple glasses of wine a week   Drug use: No    Review of Systems Constitutional: Positive for hot flashes.  Eyes: No visual changes. ENT: No sore throat. Cardiovascular: Positive for chest pain. Respiratory: Positive for shortness of breath. Gastrointestinal: No abdominal pain.  No nausea, no vomiting.  No diarrhea.   Genitourinary: Negative for  dysuria. Musculoskeletal: Negative for back pain. Skin: Negative for rash. Neurological: Negative for headaches, focal weakness or numbness.  ____________________________________________   PHYSICAL EXAM:  VITAL SIGNS: ED Triage Vitals  Enc Vitals Group     BP 10/02/21 2117 117/83     Pulse Rate 10/02/21 2117 (!) 130     Resp 10/02/21 2117 20     Temp 10/02/21 2117 98.2 F (36.8 C)     Temp Source 10/02/21 2117 Oral     SpO2 10/02/21 2117 94 %     Weight 10/02/21 2118 160 lb (72.6 kg)     Height 10/02/21 2118 5' 6"  (1.676 m)  Head Circumference --      Peak Flow --      Pain Score 10/02/21 2118 5   Constitutional: Alert and oriented.  Eyes: Conjunctivae are normal.  ENT      Head: Normocephalic and atraumatic.      Nose: No congestion/rhinnorhea.      Mouth/Throat: Mucous membranes are moist.      Neck: No stridor. Hematological/Lymphatic/Immunilogical: No cervical lymphadenopathy. Cardiovascular: Tachycardic, regular rhythm.  No murmurs, rubs, or gallops.  Respiratory: Increased respiratory effort. Rhonchi noted at bases.  Gastrointestinal: Soft and non tender. No rebound. No guarding.  Genitourinary: Deferred Musculoskeletal: Normal range of motion in all extremities. No lower extremity edema. Neurologic:  Normal speech and language. No gross focal neurologic deficits are appreciated.  Skin:  Skin is warm, dry and intact. No rash noted. Psychiatric: Mood and affect are normal. Speech and behavior are normal. Patient exhibits appropriate insight and judgment.  ____________________________________________    LABS (pertinent positives/negatives)  Lactic acid 1.1 CBC wbc 21.9, hgb 14.4, plt 495 CMP na 130, k 4.0, glu 119, cr 0.69 Trop hs 5 ____________________________________________   EKG  I, Nance Pear, attending physician, personally viewed and interpreted this EKG  EKG Time: 2117 Rate: 130 Rhythm: sinus tachycardia Axis: left axis  deviation Intervals: qtc 435 QRS: narrow ST changes: no st elevation Impression: abnormal ekg   ____________________________________________    RADIOLOGY  CXR Worsening left basilar opacities  CT angio chest No PE. Bilateral pleural effusions with left lower consolidation. Large pericardial effusion.  ____________________________________________   PROCEDURES  Procedures  ____________________________________________   INITIAL IMPRESSION / ASSESSMENT AND PLAN / ED COURSE  Pertinent labs & imaging results that were available during my care of the patient were reviewed by me and considered in my medical decision making (see chart for details).  Patient presented to the emergency department today with concern for chest pain and shortness of breath.  Chest pain is located underneath the sternum.  Patient was recently diagnosed and started treatment for pneumonia.  Initial vital signs were concerning for tachycardia, tachypnea and hypoxia.  I did have concern for worsening pneumonia so patient was started on broad-spectrum antibiotics.  Chest x-ray was concerning for worsening left basilar infiltrate.  Given patient's history of cancer I did have also concerns for possible pulmonary embolism.  CT angio was obtained.  While this did not show pulmonary embolism but did show a new large pericardial effusion. Because of concern for pericardial effusion do feel patient would benefit from facility with CT surgery capability. Will reach out to [facility] for transfer. Discussed findings with patient.    ____________________________________________   FINAL CLINICAL IMPRESSION(S) / ED DIAGNOSES  Final diagnoses:  Chest pain, unspecified type  Pneumonia due to infectious organism, unspecified laterality, unspecified part of lung  Pericardial effusion     Note: This dictation was prepared with Dragon dictation. Any transcriptional errors that result from this process are  unintentional     Nance Pear, MD 10/03/21 [phone]

## 2021-10-02 NOTE — ED Triage Notes (Signed)
Pt with back pain starting last week, pt had pnuemonia, pt with leukemia at present, is on meds for thrush and also on antibiotics, pt woke up this am with neck pain and chest pain. Pt confused at times per wife and pt. Pt states chest pain is sternal and feels like pressure.

## 2021-10-03 ENCOUNTER — Encounter (HOSPITAL_COMMUNITY)
Admission: AD | Disposition: A | Discharge status: Home / Self Care | Payer: Self-pay | Source: Other Acute Inpatient Hospital | Attending: Internal Medicine

## 2021-10-03 ENCOUNTER — Encounter (HOSPITAL_COMMUNITY): Payer: Self-pay | Admitting: Internal Medicine

## 2021-10-03 ENCOUNTER — Inpatient Hospital Stay (HOSPITAL_COMMUNITY): Payer: Medicare PPO

## 2021-10-03 ENCOUNTER — Inpatient Hospital Stay (HOSPITAL_COMMUNITY)
Admission: AD | Admit: 2021-10-03 | Discharge: 2021-10-05 | DRG: 314 | Disposition: A | Discharge status: Home / Self Care | Payer: Medicare PPO | Source: Other Acute Inpatient Hospital | Attending: Family Medicine | Admitting: Family Medicine

## 2021-10-03 DIAGNOSIS — Z8249 Family history of ischemic heart disease and other diseases of the circulatory system: Secondary | ICD-10-CM

## 2021-10-03 DIAGNOSIS — R000 Tachycardia, unspecified: Secondary | ICD-10-CM | POA: Diagnosis not present

## 2021-10-03 DIAGNOSIS — Z20822 Contact with and (suspected) exposure to covid-19: Secondary | ICD-10-CM | POA: Diagnosis not present

## 2021-10-03 DIAGNOSIS — C9110 Chronic lymphocytic leukemia of B-cell type not having achieved remission: Secondary | ICD-10-CM | POA: Diagnosis present

## 2021-10-03 DIAGNOSIS — I314 Cardiac tamponade: Secondary | ICD-10-CM | POA: Diagnosis present

## 2021-10-03 DIAGNOSIS — B370 Candidal stomatitis: Secondary | ICD-10-CM | POA: Diagnosis not present

## 2021-10-03 DIAGNOSIS — J9601 Acute respiratory failure with hypoxia: Secondary | ICD-10-CM | POA: Diagnosis not present

## 2021-10-03 DIAGNOSIS — Z79899 Other long term (current) drug therapy: Secondary | ICD-10-CM | POA: Diagnosis not present

## 2021-10-03 DIAGNOSIS — E871 Hypo-osmolality and hyponatremia: Secondary | ICD-10-CM | POA: Diagnosis present

## 2021-10-03 DIAGNOSIS — I319 Disease of pericardium, unspecified: Secondary | ICD-10-CM | POA: Diagnosis not present

## 2021-10-03 DIAGNOSIS — I2510 Atherosclerotic heart disease of native coronary artery without angina pectoris: Secondary | ICD-10-CM | POA: Diagnosis present

## 2021-10-03 DIAGNOSIS — E861 Hypovolemia: Secondary | ICD-10-CM | POA: Diagnosis present

## 2021-10-03 DIAGNOSIS — I77810 Thoracic aortic ectasia: Secondary | ICD-10-CM | POA: Diagnosis present

## 2021-10-03 DIAGNOSIS — A419 Sepsis, unspecified organism: Secondary | ICD-10-CM | POA: Diagnosis not present

## 2021-10-03 DIAGNOSIS — L409 Psoriasis, unspecified: Secondary | ICD-10-CM | POA: Diagnosis present

## 2021-10-03 DIAGNOSIS — I3139 Other pericardial effusion (noninflammatory): Secondary | ICD-10-CM

## 2021-10-03 DIAGNOSIS — Z820 Family history of epilepsy and other diseases of the nervous system: Secondary | ICD-10-CM | POA: Diagnosis not present

## 2021-10-03 DIAGNOSIS — I358 Other nonrheumatic aortic valve disorders: Secondary | ICD-10-CM

## 2021-10-03 DIAGNOSIS — K900 Celiac disease: Secondary | ICD-10-CM | POA: Diagnosis present

## 2021-10-03 DIAGNOSIS — J189 Pneumonia, unspecified organism: Secondary | ICD-10-CM | POA: Diagnosis present

## 2021-10-03 DIAGNOSIS — Z8679 Personal history of other diseases of the circulatory system: Secondary | ICD-10-CM | POA: Diagnosis not present

## 2021-10-03 DIAGNOSIS — Z720 Tobacco use: Secondary | ICD-10-CM | POA: Diagnosis not present

## 2021-10-03 DIAGNOSIS — D75839 Thrombocytosis, unspecified: Secondary | ICD-10-CM | POA: Diagnosis present

## 2021-10-03 DIAGNOSIS — E785 Hyperlipidemia, unspecified: Secondary | ICD-10-CM | POA: Diagnosis present

## 2021-10-03 DIAGNOSIS — F17210 Nicotine dependence, cigarettes, uncomplicated: Secondary | ICD-10-CM | POA: Diagnosis not present

## 2021-10-03 DIAGNOSIS — R079 Chest pain, unspecified: Secondary | ICD-10-CM | POA: Diagnosis not present

## 2021-10-03 DIAGNOSIS — J168 Pneumonia due to other specified infectious organisms: Secondary | ICD-10-CM | POA: Diagnosis not present

## 2021-10-03 HISTORY — DX: Supraventricular tachycardia: I47.1

## 2021-10-03 HISTORY — DX: Other supraventricular tachycardia: I47.19

## 2021-10-03 HISTORY — PX: PERICARDIOCENTESIS: CATH118255

## 2021-10-03 HISTORY — DX: Chronic lymphocytic leukemia of B-cell type not having achieved remission: C91.10

## 2021-10-03 LAB — URINALYSIS, ROUTINE W REFLEX MICROSCOPIC
Bacteria, UA: NONE SEEN
Bilirubin Urine: NEGATIVE
Glucose, UA: NEGATIVE mg/dL
Ketones, ur: NEGATIVE mg/dL
Leukocytes,Ua: NEGATIVE
Nitrite: NEGATIVE
Protein, ur: NEGATIVE mg/dL
Specific Gravity, Urine: 1.002 — ABNORMAL LOW (ref 1.005–1.030)
pH: 6 (ref 5.0–8.0)

## 2021-10-03 LAB — GRAM STAIN

## 2021-10-03 LAB — CBC
HCT: 38.5 % — ABNORMAL LOW (ref 39.0–52.0)
Hemoglobin: 12.8 g/dL — ABNORMAL LOW (ref 13.0–17.0)
MCH: 29.5 pg (ref 26.0–34.0)
MCHC: 33.2 g/dL (ref 30.0–36.0)
MCV: 88.7 fL (ref 80.0–100.0)
Platelets: 441 10*3/uL — ABNORMAL HIGH (ref 150–400)
RBC: 4.34 MIL/uL (ref 4.22–5.81)
RDW: 14 % (ref 11.5–15.5)
WBC: 17 10*3/uL — ABNORMAL HIGH (ref 4.0–10.5)
nRBC: 0 % (ref 0.0–0.2)

## 2021-10-03 LAB — TROPONIN I (HIGH SENSITIVITY): Troponin I (High Sensitivity): 6 ng/L (ref <18)

## 2021-10-03 LAB — ECHOCARDIOGRAM COMPLETE
Height: 66 in
S' Lateral: 3 cm
Weight: 2585.55 oz

## 2021-10-03 LAB — BODY FLUID CELL COUNT WITH DIFFERENTIAL
Eos, Fluid: 0 %
Lymphs, Fluid: 3 %
Monocyte-Macrophage-Serous Fluid: 0 % — ABNORMAL LOW (ref 50–90)
Neutrophil Count, Fluid: 97 % — ABNORMAL HIGH (ref 0–25)
Total Nucleated Cell Count, Fluid: 2100 cu mm — ABNORMAL HIGH (ref 0–1000)

## 2021-10-03 LAB — ECHOCARDIOGRAM LIMITED
Height: 66 in
Weight: 2585.55 oz

## 2021-10-03 LAB — BASIC METABOLIC PANEL
Anion gap: 8 (ref 5–15)
BUN: 7 mg/dL — ABNORMAL LOW (ref 8–23)
CO2: 21 mmol/L — ABNORMAL LOW (ref 22–32)
Calcium: 8.6 mg/dL — ABNORMAL LOW (ref 8.9–10.3)
Chloride: 104 mmol/L (ref 98–111)
Creatinine, Ser: 0.68 mg/dL (ref 0.61–1.24)
GFR, Estimated: >60 mL/min (ref >60)
Glucose, Bld: 94 mg/dL (ref 70–99)
Potassium: 4 mmol/L (ref 3.5–5.1)
Sodium: 133 mmol/L — ABNORMAL LOW (ref 135–145)

## 2021-10-03 LAB — MAGNESIUM: Magnesium: 1.8 mg/dL (ref 1.7–2.4)

## 2021-10-03 LAB — MRSA NEXT GEN BY PCR, NASAL: MRSA by PCR Next Gen: NOT DETECTED

## 2021-10-03 SURGERY — PERICARDIOCENTESIS
Anesthesia: LOCAL

## 2021-10-03 MED ORDER — SODIUM CHLORIDE 0.9 % IV SOLN: INTRAVENOUS | Status: AC

## 2021-10-03 MED ORDER — LACTATED RINGERS IV SOLN: INTRAVENOUS | Status: DC

## 2021-10-03 MED ORDER — ALLOPURINOL 300 MG PO TABS
300.0000 mg | ORAL_TABLET | Freq: Two times a day (BID) | ORAL | Status: DC
  Administered 2021-10-03 – 2021-10-05 (×5): 300 mg via ORAL
  Filled 2021-10-03 (×5): qty 1

## 2021-10-03 MED ORDER — VANCOMYCIN HCL 750 MG/150ML IV SOLN
750.0000 mg | Freq: Two times a day (BID) | INTRAVENOUS | Status: DC
  Administered 2021-10-03 – 2021-10-04 (×3): 750 mg via INTRAVENOUS
  Filled 2021-10-03 (×3): qty 150

## 2021-10-03 MED ORDER — MORPHINE SULFATE (PF) 2 MG/ML IV SOLN
2.0000 mg | INTRAVENOUS | Status: DC | PRN
Start: 2021-10-03 — End: 2021-10-03

## 2021-10-03 MED ORDER — FENTANYL CITRATE PF 50 MCG/ML IJ SOSY: 50.0000 ug | PREFILLED_SYRINGE | INTRAMUSCULAR | Status: DC | PRN

## 2021-10-03 MED ORDER — LABETALOL HCL 5 MG/ML IV SOLN: 10.0000 mg | INTRAVENOUS | Status: AC | PRN

## 2021-10-03 MED ORDER — ONDANSETRON HCL 4 MG/2ML IJ SOLN: 4.0000 mg | Freq: Four times a day (QID) | INTRAMUSCULAR | Status: DC | PRN

## 2021-10-03 MED ORDER — FENTANYL CITRATE (PF) 100 MCG/2ML IJ SOLN
INTRAMUSCULAR | Status: DC | PRN
  Administered 2021-10-03 (×3): 25 ug via INTRAVENOUS

## 2021-10-03 MED ORDER — MIDAZOLAM HCL 2 MG/2ML IJ SOLN
INTRAMUSCULAR | Status: AC
  Filled 2021-10-03: qty 2

## 2021-10-03 MED ORDER — ACETAMINOPHEN 325 MG PO TABS
650.0000 mg | ORAL_TABLET | ORAL | Status: DC | PRN
  Administered 2021-10-03 – 2021-10-04 (×2): 650 mg via ORAL
  Filled 2021-10-03 (×2): qty 2

## 2021-10-03 MED ORDER — HEPARIN (PORCINE) IN NACL 1000-0.9 UT/500ML-% IV SOLN
INTRAVENOUS | Status: AC
  Filled 2021-10-03: qty 1000

## 2021-10-03 MED ORDER — ACYCLOVIR 400 MG PO TABS
400.0000 mg | ORAL_TABLET | Freq: Two times a day (BID) | ORAL | Status: DC
  Administered 2021-10-03 – 2021-10-05 (×4): 400 mg via ORAL
  Filled 2021-10-03 (×8): qty 1

## 2021-10-03 MED ORDER — FENTANYL CITRATE PF 50 MCG/ML IJ SOSY
50.0000 ug | PREFILLED_SYRINGE | Freq: Once | INTRAMUSCULAR | Status: AC
  Administered 2021-10-03: 50 ug via INTRAVENOUS
  Filled 2021-10-03: qty 1

## 2021-10-03 MED ORDER — CHLORHEXIDINE GLUCONATE CLOTH 2 % EX PADS
6.0000 | MEDICATED_PAD | Freq: Every day | CUTANEOUS | Status: DC
  Administered 2021-10-04 – 2021-10-05 (×2): 6 via TOPICAL

## 2021-10-03 MED ORDER — ACETAMINOPHEN 325 MG PO TABS: 650.0000 mg | ORAL_TABLET | Freq: Four times a day (QID) | ORAL | Status: DC | PRN

## 2021-10-03 MED ORDER — IOHEXOL 350 MG/ML SOLN
INTRAVENOUS | Status: DC | PRN
  Administered 2021-10-03: 3 mL

## 2021-10-03 MED ORDER — SENNOSIDES-DOCUSATE SODIUM 8.6-50 MG PO TABS: 1.0000 | ORAL_TABLET | Freq: Every evening | ORAL | Status: DC | PRN

## 2021-10-03 MED ORDER — LIDOCAINE HCL (PF) 1 % IJ SOLN
INTRAMUSCULAR | Status: AC
  Filled 2021-10-03: qty 30

## 2021-10-03 MED ORDER — HYDRALAZINE HCL 20 MG/ML IJ SOLN: 10.0000 mg | INTRAMUSCULAR | Status: AC | PRN

## 2021-10-03 MED ORDER — SODIUM CHLORIDE 0.9 % IV SOLN: 250.0000 mL | INTRAVENOUS | Status: DC | PRN

## 2021-10-03 MED ORDER — MORPHINE SULFATE (PF) 4 MG/ML IV SOLN
4.0000 mg | INTRAVENOUS | Status: DC | PRN
  Administered 2021-10-03 (×2): 4 mg via INTRAVENOUS
  Filled 2021-10-03 (×2): qty 1

## 2021-10-03 MED ORDER — NYSTATIN 100000 UNIT/ML MT SUSP
5.0000 mL | Freq: Three times a day (TID) | OROMUCOSAL | Status: DC
  Administered 2021-10-03 – 2021-10-05 (×8): 500000 [IU] via ORAL
  Filled 2021-10-03 (×7): qty 5

## 2021-10-03 MED ORDER — ENOXAPARIN SODIUM 40 MG/0.4ML IJ SOSY
40.0000 mg | PREFILLED_SYRINGE | Freq: Every day | INTRAMUSCULAR | Status: DC
  Administered 2021-10-03 – 2021-10-05 (×3): 40 mg via SUBCUTANEOUS
  Filled 2021-10-03 (×3): qty 0.4

## 2021-10-03 MED ORDER — ONDANSETRON HCL 4 MG PO TABS: 4.0000 mg | ORAL_TABLET | Freq: Four times a day (QID) | ORAL | Status: DC | PRN

## 2021-10-03 MED ORDER — SODIUM CHLORIDE 0.9 % IV SOLN
500.0000 mg | Freq: Every day | INTRAVENOUS | Status: DC
  Administered 2021-10-03 – 2021-10-05 (×3): 500 mg via INTRAVENOUS
  Filled 2021-10-03 (×3): qty 500

## 2021-10-03 MED ORDER — SODIUM CHLORIDE 0.9 % IV SOLN
2.0000 g | INTRAVENOUS | Status: DC
  Administered 2021-10-04 – 2021-10-05 (×2): 2 g via INTRAVENOUS
  Filled 2021-10-03 (×4): qty 20

## 2021-10-03 MED ORDER — HEPARIN (PORCINE) IN NACL 1000-0.9 UT/500ML-% IV SOLN
INTRAVENOUS | Status: DC | PRN
  Administered 2021-10-03 (×2): 500 mL

## 2021-10-03 MED ORDER — MIDAZOLAM HCL 2 MG/2ML IJ SOLN
INTRAMUSCULAR | Status: DC | PRN
  Administered 2021-10-03 (×2): 1 mg via INTRAVENOUS

## 2021-10-03 MED ORDER — MORPHINE SULFATE (PF) 4 MG/ML IV SOLN
3.0000 mg | Freq: Once | INTRAVENOUS | Status: AC
  Administered 2021-10-03: 3 mg via INTRAVENOUS
  Filled 2021-10-03: qty 1

## 2021-10-03 MED ORDER — ACETAMINOPHEN 650 MG RE SUPP: 650.0000 mg | Freq: Four times a day (QID) | RECTAL | Status: DC | PRN

## 2021-10-03 MED ORDER — SODIUM CHLORIDE 0.9% FLUSH
3.0000 mL | Freq: Two times a day (BID) | INTRAVENOUS | Status: DC
  Administered 2021-10-03 – 2021-10-05 (×4): 3 mL via INTRAVENOUS

## 2021-10-03 MED ORDER — SODIUM CHLORIDE 0.9% FLUSH: 3.0000 mL | INTRAVENOUS | Status: DC | PRN

## 2021-10-03 MED ORDER — FENTANYL CITRATE (PF) 100 MCG/2ML IJ SOLN
INTRAMUSCULAR | Status: AC
  Filled 2021-10-03: qty 2

## 2021-10-03 MED ORDER — LIDOCAINE HCL (PF) 1 % IJ SOLN
INTRAMUSCULAR | Status: DC | PRN
  Administered 2021-10-03: 30 mL
  Administered 2021-10-03: 15 mL
  Administered 2021-10-03: 20 mL

## 2021-10-03 SURGICAL SUPPLY — 7 items
KIT MICROINTRODUCER 5F 7206 (SHEATH) ×3 IMPLANT
TRANSDUCER W/MONITORING KIT (MISCELLANEOUS) ×3 IMPLANT
TRAY PERICARDIOCENTESIS 6FX60 (TRAY / TRAY PROCEDURE) ×3 IMPLANT
TUBING ART PRESS 72  MALE/FEM (TUBING) ×2
TUBING ART PRESS 72 MALE/FEM (TUBING) ×1 IMPLANT
WIRE EMERALD 3MM-J .035X150CM (WIRE) ×3 IMPLANT
WIRE HI TORQ VERSACORE-J 145CM (WIRE) ×3 IMPLANT

## 2021-10-03 NOTE — ED Notes (Signed)
EMTALA reviewed by this RN.  

## 2021-10-03 NOTE — H&P (View-Only) (Signed)
Cardiology Consultation:   Patient ID: Andres Mullins MRN: [id number]; DOB: [birth year]  Admit date: 10/03/2021 Date of Consult: 10/03/2021  PCP:  [name], PA-C   [facility] Providers Cardiologist:  [name], MD    (last seen [id number]) Click here to update MD or APP on Care Team, Refresh:1}     Patient Profile:   Andres Mullins is a 70 y.o. male with a hx of CLL, HLD, atrial tachycardia, aortic + coronary calcium by prior lung CA screening CT, ongoing tobacco abuse, celiac disease, vitamin D deficiency, psoriasis who is being seen 10/03/2021 for the evaluation of large pericardial effusion at the request of Dr. Myna Hidalgo.  History of Present Illness:   [name] remotely established with Dr. Saunders Revel in 2017 for incidental pickup of coronary calcification on prior lung CA screening. He was also incidentally noted to have atrial tachycardia, asymptomatic at that time, started on beta blocker. Echo 2017 with normal EF, mildly dilated aortic root/ascending aorta. The patient ultimately stopped the metoprolol due to fatigue and feeling melancholy - last OV 2018. He has had CLL with prior treatment with Gazyva and then more recently started on venetoclax.   He recently saw his heme-onc team 09/29/21 with anticipated plan to increase venetoclax at which time he reported 3 day history of sore throat, fatigue, appetite changes, hot flashes and SOB for the past 3 days. He was tachycardic at that visit. His sore throat was felt due to thrush and he was started on nystatin. CXR was performed showing new airspace opacity at the left lung base suspicious for PNA. He was started on Levaquin. Over the weekend he began to develop intermittent dull chest pain, not specifically positional/pleuritic, and the sensation that he could not take a deep breath either. He thus presented to [facility] ED and was reported to be hypoxic as well. CXR showed worsening left basilar opacities concerning for infection. CT Angio  showed new large pericardial effusion when compared with prior CT from September of 2022, bilateral pleural effusions with associated lower lobe consolidation L>R, interval decrease in axillary and mediastinal lymph nodes when compared with the recent exam, no evidence of pulmonary emboli. Initial labs are pertinent for hyponatremia 130, leukocytosis 21.9 and thrombocytosis 495, INR 1.3, neg troponin x2, Covid/flu panel negative. BP has been ranging 993-716 systolic with HR [id number].  Denies any recent orthopnea, syncope, bleeding, weight change, known fevers or chills (just hot flashes). Has smoked since college. Father had bypass around his current age.  Past Medical History:  Diagnosis Date   Atherosclerosis of coronary artery 09/28/2016   Chest CT Nov 2017   Atrial tachycardia (HCC)    CLL (chronic lymphocytic leukemia) (HCC)    Elevated C-reactive protein (CRP)    Elevated PSA    Fractured sternum 1999   Herpes    History of rib fracture 1999   multiple   Hyperlipidemia    Psoriasis    Rheumatoid factor positive 02/2014   Rosacea    Thoracic aortic atherosclerosis ([place]) 09/28/2016   Chest CT Nov 2017   Tobacco abuse    Vitamin D deficiency disease     Past Surgical History:  Procedure Laterality Date   APPENDECTOMY  1974   PORTA CATH INSERTION N/A 06/06/2021   Procedure: PORTA CATH INSERTION;  Surgeon: Algernon Huxley, MD;  Location: [facility];  Service: Cardiovascular;  Laterality: N/A;   SHOULDER ARTHROSCOPY  10/23/2016   Procedure: ARTHROSCOPY SHOULDER WITH DISTAL CLAVICLE EXCISION, PARTIAL ACROMIONECTOMY,  AND DEBRIDEMENT;  Surgeon: Earnestine Leys, MD;  Location: [facility];  Service: Orthopedics;;   TIBIA FRACTURE SURGERY Left 2004   titanium rod, ORIF     Home Medications:  Prior to Admission medications   Medication Sig Start Date End Date Taking? Authorizing Provider  acetaminophen (TYLENOL) 325 MG tablet Take by mouth every 6 (six) hours as needed.     [provider]  acyclovir (ZOVIRAX) 400 MG tablet Take 1 tablet (400 mg total) by mouth 2 (two) times daily. 05/22/21   Cammie Sickle, MD  allopurinol (ZYLOPRIM) 300 MG tablet Take 1 tablet (300 mg total) by mouth 2 (two) times daily. 07/21/21   Cammie Sickle, MD  diphenhydrAMINE (BENADRYL) 25 MG tablet Take 25 mg by mouth every 6 (six) hours as needed.    [provider]  levofloxacin (LEVAQUIN) 500 MG tablet Take 1 tablet (500 mg total) by mouth daily. 09/29/21   Jacquelin Hawking, NP  lidocaine-prilocaine (EMLA) cream Apply 30 -45 mins prior to port access. 05/22/21   Cammie Sickle, MD  montelukast (SINGULAIR) 10 MG tablet Take 1 tablet (10 mg total) by mouth at bedtime. Start 2 days prior to infusion. Do not take on the day of infusion. 05/22/21   Cammie Sickle, MD  nystatin (MYCOSTATIN) 100000 UNIT/ML suspension Take 5 mLs (500,000 Units total) by mouth 4 (four) times daily. 09/29/21   Jacquelin Hawking, NP  venetoclax (VENCLEXTA) 10 & 50 & 100 MG Starter Pack Take by mouth daily. Take 20 mg for 7 days, then 50 mg daily x 7d, then 100 mg daily x 7d, then 200 mg daily x 7d. Take with food & water. 08/03/21   Cammie Sickle, MD    Inpatient Medications: Scheduled Meds:  acyclovir  400 mg Oral BID   allopurinol  300 mg Oral BID   enoxaparin (LOVENOX) injection  40 mg Subcutaneous Daily   nystatin  5 mL Oral TID AC & HS   sodium chloride flush  3 mL Intravenous Q12H   Continuous Infusions:  sodium chloride     azithromycin     cefTRIAXone (ROCEPHIN)  IV     vancomycin 750 mg (10/03/21 0616)   PRN Meds: acetaminophen **OR** acetaminophen, morphine injection, ondansetron **OR** ondansetron (ZOFRAN) IV, senna-docusate  Allergies:    Allergies  Allergen Reactions   Gluten Meal     Gluten intolerance     Social History:   Social History   Socioeconomic History   Marital status: Married    Spouse name: colleen   Number of  children: Not on file   Years of education: 12   Highest education level: Bachelor's degree (e.g., BA, AB, BS)  Occupational History   Occupation: retired  Tobacco Use   Smoking status: Every Day    Packs/day: 0.50    Years: 45.00    Pack years: 22.50    Types: Cigarettes   Smokeless tobacco: Never   Tobacco comments:    11/6- currently smoking 5 cigarettes a day   Vaping Use   Vaping Use: Never used  Substance and Sexual Activity   Alcohol use: Yes    Alcohol/week: 3.0 standard drinks    Types: 3 Glasses of wine per week    Comment: a couple glasses of wine a week   Drug use: No   Sexual activity: Yes  Other Topics Concern   Not on file  Social History Narrative   Consumes ~4 cups of coffee/day  Social Determinants of Health   Financial Resource Strain: Not on file  Food Insecurity: Not on file  Transportation Needs: Not on file  Physical Activity: Not on file  Stress: Not on file  Social Connections: Not on file  Intimate Partner Violence: Not on file    Family History:    Family History  Problem Relation Age of Onset   Heart disease Father    Osteoarthritis Father    Alzheimer's disease Father    Cancer Neg Hx    Diabetes Neg Hx    Hypertension Neg Hx    Stroke Neg Hx    COPD Neg Hx      ROS:  Please see the history of present illness.  All other ROS reviewed and negative.     Physical Exam/Data:   Vitals:   10/03/21 0257 10/03/21 0300 10/03/21 0438  BP: 103/77 103/77 (!) 119/91  Pulse: (!) 117 87 (!) 115  Resp: (!) 34 (!) 36 (!) 39  Temp: 97.7 F (36.5 C) 97.7 F (36.5 C) (!) 97.5 F (36.4 C)  TempSrc: Oral  Oral  SpO2: 93% 93% 98%  Weight: 73.3 kg    Height: 5' 6"  (1.676 m)      Intake/Output Summary (Last 24 hours) at 10/03/2021 0802 Last data filed at 10/03/2021 0616 Gross per 24 hour  Intake 5 ml  Output --  Net 5 ml   Last 3 Weights 10/03/2021 10/02/2021 09/15/2021  Weight (lbs) 161 lb 9.6 oz 160 lb 162 lb  Weight (kg) 73.3 kg  72.576 kg 73.483 kg     Body mass index is 26.08 kg/m.  Limited exam during echo General: Well developed, well nourished, in no acute distress but mild increase WOB. Lying roughly 20 degrees in bed Head: Normocephalic, atraumatic, sclera non-icteric, no xanthomas, nares are without discharge. Neck: JVP not overtly elevated. Lungs: Diminished at bases without obvious wheezes, rales, or rhonchi. Speaks in 3-4 word sentences before pausing to breathe - increased RR Heart: Regular rhythm, tachycardic, S1 S2 without murmurs, rubs, or gallops.  Abdomen: Soft, non-tender, non-distended with normoactive bowel sounds. No rebound/guarding. Extremities: No clubbing or cyanosis. No edema. Distal pedal pulses are 2+ and equal bilaterally. Psoriatic plaques noted. Neuro: Alert and oriented X 3. Moves all extremities spontaneously. Psych:  Responds to questions appropriately with a normal affect.   EKG:  The EKG was personally reviewed and demonstrates:  sinus tach 103bpm, left axis deviation, low voltage QRS, poor R wave progression, nonspecific TW changes, no electrical alternans Telemetry:  Telemetry was personally reviewed and demonstrates:  sinus tach  Relevant CV Studies: 2D Echo 10/2016 - Left ventricle: The cavity size was normal. Systolic function was    normal. The estimated ejection fraction was in the range of 60%    to 65%. Wall motion was normal; there were no regional wall    motion abnormalities. Left ventricular diastolic function    parameters were normal.  - Aortic valve: There was trivial regurgitation.  - Aortic root: The aortic root was mildly dilated, 3.5 cm  - Ascending aorta: The ascending aorta was mildly dilated.  - Left atrium: The atrium was normal in size.  - Right ventricle: Systolic function was normal.  - Pulmonary arteries: Systolic pressure was within the normal    range.   Laboratory Data:  High Sensitivity Troponin:   Recent Labs  Lab 10/02/21 2125  10/02/21 2322  TROPONINIHS 5 6     Chemistry Recent Labs  Lab 09/29/21 1126  10/02/21 2125 10/03/21 0637  NA 132* 130* 133*  K 3.7 4.0 4.0  CL 97* 100 104  CO2 25 19* 21*  GLUCOSE 106* 119* 94  BUN 13 8 7*  CREATININE 0.77 0.69 0.68  CALCIUM 8.8* 8.9 8.6*  MG 2.2  --  1.8  GFRNONAA >60 >60 >60  ANIONGAP 10 11 8     Recent Labs  Lab 09/29/21 1126 10/02/21 2125  PROT 6.9 6.5  ALBUMIN 3.2* 3.0*  AST 17 15  ALT 17 13  ALKPHOS 78 59  BILITOT 0.8 1.1   Lipids No results for input(s): CHOL, TRIG, HDL, LABVLDL, LDLCALC, CHOLHDL in the last 168 hours.  Hematology Recent Labs  Lab 09/29/21 1126 10/02/21 2125 10/03/21 0637  WBC 12.5* 21.9* 17.0*  RBC 4.47 4.74 4.34  HGB 13.8 14.4 12.8*  HCT 39.5 41.0 38.5*  MCV 88.4 86.5 88.7  MCH 30.9 30.4 29.5  MCHC 34.9 35.1 33.2  RDW 13.8 13.9 14.0  PLT 408* 495* 441*   Thyroid No results for input(s): TSH, FREET4 in the last 168 hours.  BNPNo results for input(s): BNP, PROBNP in the last 168 hours.  DDimer No results for input(s): DDIMER in the last 168 hours.   Radiology/Studies:  DG Chest 2 View  Result Date: 09/29/2021 CLINICAL DATA:  Shortness of breath EXAM: CHEST - 2 VIEW COMPARISON:  CT chest abdomen pelvis 08/05/2021 FINDINGS: The heart size is within normal limits. No pulmonary vascular congestion. Patchy opacity at the left lung base is new since prior CT. Right IJ chest port terminates at the cavoatrial junction. Minimal bilateral pleural effusions are seen. IMPRESSION: New airspace opacity at the left lung base is suspicious for pneumonia. Electronically Signed   By: Miachel Roux M.D.   On: 09/29/2021 13:42   CT Angio Chest PE W and/or Wo Contrast  Result Date: 10/02/2021 CLINICAL DATA:  Chest pain and hypoxia, history of leukemia EXAM: CT ANGIOGRAPHY CHEST WITH CONTRAST TECHNIQUE: Multidetector CT imaging of the chest was performed using the standard protocol during bolus administration of intravenous contrast.  Multiplanar CT image reconstructions and MIPs were obtained to evaluate the vascular anatomy. CONTRAST:  68m OMNIPAQUE IOHEXOL 350 MG/ML SOLN COMPARISON:  Chest x-ray from earlier in the same day, CT from 08/05/2021. FINDINGS: Cardiovascular: Thoracic aorta and its branches are within normal limits without aneurysmal dilatation or dissection. Heart is not significantly enlarged although a large pericardial effusion is noted new from prior CT from 08/05/2021. The pulmonary artery shows no evidence of pulmonary embolism. Normal branching pattern is seen. No significant coronary calcifications are noted. Right-sided chest wall port is seen with catheter tip in the superior vena cava. Mediastinum/Nodes: Thoracic inlet is unremarkable. No sizable hilar or mediastinal adenopathy is noted. Small scattered lymph nodes are noted stable from the prior study. There are multiple axillary lymph nodes which appear enlarged but overall improved when compared with the prior study. Index axillary lymph node measures 2.8 by 0.8 cm in greatest dimension which is decreased from the prior exam at which time it measured 3.8 x 1.4 cm. Index left axillary node previously measured 3.9 by 2.3 cm and now measures 2.9 by 2.0 cm. Decrease in pre-vascular lymph nodes are noted as well. The esophagus as visualized is within normal limits. Lungs/Pleura: Bilateral pleural effusions are noted left greater than right with associated lower lobe consolidation also left greater than right. No parenchymal nodule is seen. No pneumothorax is noted. Upper Abdomen: Visualized upper abdomen is within normal limits. Musculoskeletal:  Noted changes of the thoracic spine are seen. Old healed sternal fracture is seen. Review of the MIP images confirms the above findings. IMPRESSION: Interval decrease in axillary and mediastinal lymph nodes when compared with the recent exam. No evidence of pulmonary emboli. New large pericardial effusion when compared with prior  CT from September of 2022. Bilateral pleural effusions with associated lower lobe consolidation left greater than right. Electronically Signed   By: Inez Catalina M.D.   On: 10/02/2021 23:39   DG Chest Port 1 View  Result Date: 10/02/2021 CLINICAL DATA:  Sepsis EXAM: PORTABLE CHEST 1 VIEW COMPARISON:  09/29/2021 FINDINGS: Moderate cardiomegaly with left basilar opacities, which have worsened. Right chest wall Port-A-Cath tip is at the cavoatrial junction. There is right basilar atelectasis. IMPRESSION: Worsening left basilar opacities concerning for infection. Electronically Signed   By: Ulyses Jarred M.D.   On: 10/02/2021 22:20     Assessment and Plan:   1. Large pericardial effusion - clinically tachycardic with intermittent low-normal BP and ongoing SOB - although BP is similar to recent baseline, clinical scenario concerning for cardiac tamponade - called for stat echo, have notified MD to assess patient for consideration of need for pericardiocentesis - etiology not clear at this time, consideration includes pericarditis related to acute infection, autoimmune disease, or malignancy  2. Acute hypoxic respiratory failure with concern for PNA - supplemental O2 - abx per IM - other medical issues include elevated PT/INR (15.9/1.3), oropharyngeal candidasis, hyponatremia, leukocytosis and thrombocytosis  3. Sinus tachycardia - likely reactive to #1, #2, follow with clinical management of these problems - TSH with AM labs for completeness  4. CLL - had been on Gazyva then started venetoclax last month, neither of which have specific correlation with pericardial effusions per UTD  5. Prior hx coronary artery calcifications - can revisit risk modification after acute illness improved  6. Ongoing tobacco abuse - cessation will be important  Risk Assessment/Risk Scores:      For questions or updates, please contact [place] Please consult [email or web address] for contact info under     Signed, Charlie Pitter, PA-C  10/03/2021 8:02 AM  Patient seen, examined. Available data reviewed. Agree with findings, assessment, and plan as outlined by Melina Copa, PA-C.  The patient is independently interviewed and examined.  He is alert, oriented, in mild respiratory distress with tachypnea.  The patient is on oxygen per nasal cannula.  HEENT is normal.  Neck veins are elevated with significant JVD.  Lungs are diminished in the bases.  Heart is tachycardic and regular with distant heart sounds, no murmur or gallop.  Abdomen is soft and nontender with no obvious organomegaly.  Extremities have no edema.  There plaques noted on the lower extremities.  Feet are warm and well-perfused.  Neurologic is grossly intact with strength 5/5 and equal bilaterally.  Affect is normal.  The patient's CT and echo images are personally reviewed.  He has a large circumferential pericardial effusion with diffuse thickening over the RV suggestive of fibrinous deposition.  He had no pericardial effusion on a recent chest CT in September of this year.  I suspect he has an inflammatory/infectious effusion in the setting of pneumonia and underlying CLL.  He does have early signs of tamponade by echo with greater than 25% respiratory variation in mitral inflow and marked variation in tricuspid inflow.  The patient is tachycardic and tachypneic.  I think emergent pericardiocentesis is indicated.  I have personally reviewed the risks, indications, and alternatives  with the patient and his wife.  They understand that risks of serious complication such as cardiac injury, myocardial infarction, bleeding, infection, subdiaphragmatic injury, emergency cardiac surgery, and death, occur at an incidence of approximately 1%.  They provide full informed consent for the procedure.  We will follow-up with the patient in the cardiac ICU after his pericardiocentesis is completed.  I personally discussed the patient's case with cardiac surgery  as well as interventional cardiology.  Sherren Mocha, M.D. 10/03/2021 8:21 AM

## 2021-10-03 NOTE — Progress Notes (Signed)
  Echocardiogram 2D Echocardiogram has been performed.  Andres Mullins 10/03/2021, 10:40 AM

## 2021-10-03 NOTE — H&P (Signed)
History and Physical    Andres Mullins:[id number] DOB: October 05, 1951 DOA: 10/03/2021  PCP: [name], PA-C   Patient coming from: Home   Chief Complaint: Chest pain, neck pain, SOB   HPI: Andres Mullins is a pleasant 70 y.o. 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  Patient reports that he was seen in his oncology clinic on 09/29/2021 after developing shortness of breath and pain in his chest and neck earlier that morning.  He had been planning to increase venetoclax that day but this was held due to not feeling well and he had a chest x-ray performed that was concerning for a left lower lobe pneumonia.  He was started on Levaquin, began to feel better initially, but then had significant worsening in his chest pain and dyspnea the morning of 10/02/2021.  He has felt hot at times but not sure if he has had a fever.  He has been coughing, mainly nonproductive.  He denies any leg swelling.  He saw cardiology 5 years ago for coronary calcium seen on lung cancer screening CT, was found to be in atrial tachycardia and prescribed metoprolol which she took briefly before stopping due to fatigue.  [facility] ED Course: Upon arrival to the ED, patient is found to be afebrile, saturating low to mid 90s on 2 L/min of supplemental oxygen, tachypneic, tachycardic to 130, and with stable blood pressure.  EKG features sinus tachycardia with rate 130 and low voltage QRS.  Chest x-ray concerning for moderate cardiomegaly and worsening left basilar opacity.  CTA chest negative for PE and notable for interval decrease in axillary and mediastinal lymph nodes, but features a new large pericardial effusion and bilateral pleural effusions with lower lobe consolidations.  Chemistry panel sodium 130 and bicarbonate 19.  CBC with increased leukocytosis to  21,900.  Lactic acid was normal and troponin normal x2.  Blood cultures were collected and the patient was given fentanyl, liter of LR, cefepime and vancomycin in the ED.  ED physician discussed with CT surgeon at [facility] who recommended transfer for possible pericardiocentesis.  Review of Systems:  All other systems reviewed and apart from HPI, are negative.  Past Medical History:  Diagnosis Date   Atherosclerosis of coronary artery 09/28/2016   Chest CT Nov 2017   Elevated C-reactive protein (CRP)    Elevated PSA    Fractured sternum 1999   Herpes    History of rib fracture 1999   multiple   Hyperlipidemia    Psoriasis    Rheumatoid factor positive April 2015   Rosacea    Thoracic aortic atherosclerosis ([place]) 09/28/2016   Chest CT Nov 2017   Tobacco abuse    Vitamin D deficiency disease     Past Surgical History:  Procedure Laterality Date   APPENDECTOMY  1974   PORTA CATH INSERTION N/A 06/06/2021   Procedure: PORTA CATH INSERTION;  Surgeon: Algernon Huxley, MD;  Location: [facility];  Service: Cardiovascular;  Laterality: N/A;   SHOULDER ARTHROSCOPY  10/23/2016   Procedure: ARTHROSCOPY SHOULDER WITH DISTAL CLAVICLE EXCISION, PARTIAL ACROMIONECTOMY, AND DEBRIDEMENT;  Surgeon: Earnestine Leys, MD;  Location: [facility];  Service: Orthopedics;;   TIBIA FRACTURE SURGERY Left 2004   titanium rod, ORIF    Social History:   reports that he has been smoking cigarettes. He has a 22.50 pack-year smoking  history. He has never used smokeless tobacco. He reports current alcohol use of about 3.0 standard drinks per week. He reports that he does not use drugs.  Allergies  Allergen Reactions   Gluten Meal     Gluten intolerance     Family History  Problem Relation Age of Onset   Heart disease Father    Osteoarthritis Father    Alzheimer's disease Father    Cancer Neg Hx    Diabetes Neg Hx    Hypertension Neg Hx    Stroke Neg Hx    COPD Neg Hx      Prior to Admission  medications   Medication Sig Start Date End Date Taking? Authorizing Provider  acetaminophen (TYLENOL) 325 MG tablet Take by mouth every 6 (six) hours as needed.    [provider]  acyclovir (ZOVIRAX) 400 MG tablet Take 1 tablet (400 mg total) by mouth 2 (two) times daily. 05/22/21   Cammie Sickle, MD  allopurinol (ZYLOPRIM) 300 MG tablet Take 1 tablet (300 mg total) by mouth 2 (two) times daily. 07/21/21   Cammie Sickle, MD  diphenhydrAMINE (BENADRYL) 25 MG tablet Take 25 mg by mouth every 6 (six) hours as needed.    [provider]  levofloxacin (LEVAQUIN) 500 MG tablet Take 1 tablet (500 mg total) by mouth daily. 09/29/21   Jacquelin Hawking, NP  lidocaine-prilocaine (EMLA) cream Apply 30 -45 mins prior to port access. 05/22/21   Cammie Sickle, MD  montelukast (SINGULAIR) 10 MG tablet Take 1 tablet (10 mg total) by mouth at bedtime. Start 2 days prior to infusion. Do not take on the day of infusion. 05/22/21   Cammie Sickle, MD  nystatin (MYCOSTATIN) 100000 UNIT/ML suspension Take 5 mLs (500,000 Units total) by mouth 4 (four) times daily. 09/29/21   Jacquelin Hawking, NP  venetoclax (VENCLEXTA) 10 & 50 & 100 MG Starter Pack Take by mouth daily. Take 20 mg for 7 days, then 50 mg daily x 7d, then 100 mg daily x 7d, then 200 mg daily x 7d. Take with food & water. 08/03/21   Cammie Sickle, MD    Physical Exam: Vitals:   10/03/21 0257 10/03/21 0300 10/03/21 0438  BP: 103/77 103/77 (!) 119/91  Pulse: (!) 117 87 (!) 115  Resp: (!) 34 (!) 36 (!) 39  Temp: 97.7 F (36.5 C)  (!) 97.5 F (36.4 C)  TempSrc: Oral  Oral  SpO2:  93% 98%  Weight: 73.3 kg    Height: 5' 6"  (1.676 m)      Constitutional: NAD, calm  Eyes: PERTLA, lids and conjunctivae normal ENMT: Mucous membranes are moist. Posterior pharynx clear of any exudate or lesions.   Neck: supple, no masses  Respiratory: tachypneic, shallow breathing, no wheezing. Speaking full sentences.   Cardiovascular: Rate ~120 and regular. No extremity edema. Neck vein distension. SBP 112 on expiration, 106 on inspiration. Abdomen: No distension, no tenderness, soft. Bowel sounds active.  Musculoskeletal: no clubbing / cyanosis. No joint deformity upper and lower extremities.   Skin: Erythematous macules and patches with scale involving lower legs bilaterally.  Warm, dry, well-perfused. Neurologic: CN 2-12 grossly intact. Moving all extremities. Alert and oriented.  Psychiatric: Pleasant. Cooperative.    Labs and Imaging on Admission: I have personally reviewed following labs and imaging studies  CBC: Recent Labs  Lab 09/29/21 1126 10/02/21 2125  WBC 12.5* 21.9*  NEUTROABS 10.3* 18.5*  HGB 13.8 14.4  HCT 39.5 41.0  MCV 88.4 86.5  PLT 408* 101*   Basic Metabolic Panel: Recent Labs  Lab 09/29/21 1126 10/02/21 2125  NA 132* 130*  K 3.7 4.0  CL 97* 100  CO2 25 19*  GLUCOSE 106* 119*  BUN 13 8  CREATININE 0.77 0.69  CALCIUM 8.8* 8.9  MG 2.2  --   PHOS 4.2  --    GFR: Estimated Creatinine Clearance: 79.8 mL/min (by C-G formula based on SCr of 0.69 mg/dL). Liver Function Tests: Recent Labs  Lab 09/29/21 1126 10/02/21 2125  AST 17 15  ALT 17 13  ALKPHOS 78 59  BILITOT 0.8 1.1  PROT 6.9 6.5  ALBUMIN 3.2* 3.0*   No results for input(s): LIPASE, AMYLASE in the last 168 hours. No results for input(s): AMMONIA in the last 168 hours. Coagulation Profile: Recent Labs  Lab 10/02/21 2125  INR 1.3*   Cardiac Enzymes: No results for input(s): CKTOTAL, CKMB, CKMBINDEX, TROPONINI in the last 168 hours. BNP (last 3 results) No results for input(s): PROBNP in the last 8760 hours. HbA1C: No results for input(s): HGBA1C in the last 72 hours. CBG: No results for input(s): GLUCAP in the last 168 hours. Lipid Profile: No results for input(s): CHOL, HDL, LDLCALC, TRIG, CHOLHDL, LDLDIRECT in the last 72 hours. Thyroid Function Tests: No results for input(s): TSH,  T4TOTAL, FREET4, T3FREE, THYROIDAB in the last 72 hours. Anemia Panel: No results for input(s): VITAMINB12, FOLATE, FERRITIN, TIBC, IRON, RETICCTPCT in the last 72 hours. Urine analysis:    Component Value Date/Time   COLORURINE STRAW (A) 10/02/2021 2121   APPEARANCEUR CLEAR (A) 10/02/2021 2121   APPEARANCEUR Clear 11/12/2018 0830   LABSPEC 1.002 (L) 10/02/2021 2121   PHURINE 6.0 10/02/2021 2121   GLUCOSEU NEGATIVE 10/02/2021 2121   HGBUR SMALL (A) 10/02/2021 2121   BILIRUBINUR NEGATIVE 10/02/2021 2121   BILIRUBINUR Negative 11/12/2018 0830   KETONESUR NEGATIVE 10/02/2021 2121   PROTEINUR NEGATIVE 10/02/2021 2121   NITRITE NEGATIVE 10/02/2021 2121   LEUKOCYTESUR NEGATIVE 10/02/2021 2121   Sepsis Labs: @LABRCNTIP (procalcitonin:4,lacticidven:4) ) Recent Results (from the past 240 hour(s))  Resp Panel by RT-PCR (Flu A&B, Covid) Nasopharyngeal Swab     Status: None   Collection Time: 10/02/21 10:07 PM   Specimen: Nasopharyngeal Swab; Nasopharyngeal(NP) swabs in vial transport medium  Result Value Ref Range Status   SARS Coronavirus 2 by RT PCR NEGATIVE NEGATIVE Final    Comment: (NOTE) SARS-CoV-2 target nucleic acids are NOT DETECTED.  The SARS-CoV-2 RNA is generally detectable in upper respiratory specimens during the acute phase of infection. The lowest concentration of SARS-CoV-2 viral copies this assay can detect is 138 copies/mL. A negative result does not preclude SARS-Cov-2 infection and should not be used as the sole basis for treatment or other patient management decisions. A negative result may occur with  improper specimen collection/handling, submission of specimen other than nasopharyngeal swab, presence of viral mutation(s) within the areas targeted by this assay, and inadequate number of viral copies(<138 copies/mL). A negative result must be combined with clinical observations, patient history, and epidemiological information. The expected result is  Negative.  Fact Sheet for Patients:  EntrepreneurPulse.com.au  Fact Sheet for Healthcare Providers:  IncredibleEmployment.be  This test is no t yet approved or cleared by the [place] FDA and  has been authorized for detection and/or diagnosis of SARS-CoV-2 by FDA under an Emergency Use Authorization (EUA). This EUA will remain  in effect (meaning this test can be used) for the duration of the COVID-19 declaration  under Section 564(b)(1) of the Act, 21 U.S.C.section 360bbb-3(b)(1), unless the authorization is terminated  or revoked sooner.       Influenza A by PCR NEGATIVE NEGATIVE Final   Influenza B by PCR NEGATIVE NEGATIVE Final    Comment: (NOTE) The Xpert Xpress SARS-CoV-2/FLU/RSV plus assay is intended as an aid in the diagnosis of influenza from Nasopharyngeal swab specimens and should not be used as a sole basis for treatment. Nasal washings and aspirates are unacceptable for Xpert Xpress SARS-CoV-2/FLU/RSV testing.  Fact Sheet for Patients: EntrepreneurPulse.com.au  Fact Sheet for Healthcare Providers: IncredibleEmployment.be  This test is not yet approved or cleared by the [place] FDA and has been authorized for detection and/or diagnosis of SARS-CoV-2 by FDA under an Emergency Use Authorization (EUA). This EUA will remain in effect (meaning this test can be used) for the duration of the COVID-19 declaration under Section 564(b)(1) of the Act, 21 U.S.C. section 360bbb-3(b)(1), unless the authorization is terminated or revoked.  Performed at [facility], [address] [place], [place] [address]      Radiological Exams on Admission: CT Angio Chest PE W and/or Wo Contrast  Result Date: 10/02/2021 CLINICAL DATA:  Chest pain and hypoxia, history of leukemia EXAM: CT ANGIOGRAPHY CHEST WITH CONTRAST TECHNIQUE: Multidetector CT imaging of the chest was performed using the  standard protocol during bolus administration of intravenous contrast. Multiplanar CT image reconstructions and MIPs were obtained to evaluate the vascular anatomy. CONTRAST:  92m OMNIPAQUE IOHEXOL 350 MG/ML SOLN COMPARISON:  Chest x-ray from earlier in the same day, CT from 08/05/2021. FINDINGS: Cardiovascular: Thoracic aorta and its branches are within normal limits without aneurysmal dilatation or dissection. Heart is not significantly enlarged although a large pericardial effusion is noted new from prior CT from 08/05/2021. The pulmonary artery shows no evidence of pulmonary embolism. Normal branching pattern is seen. No significant coronary calcifications are noted. Right-sided chest wall port is seen with catheter tip in the superior vena cava. Mediastinum/Nodes: Thoracic inlet is unremarkable. No sizable hilar or mediastinal adenopathy is noted. Small scattered lymph nodes are noted stable from the prior study. There are multiple axillary lymph nodes which appear enlarged but overall improved when compared with the prior study. Index axillary lymph node measures 2.8 by 0.8 cm in greatest dimension which is decreased from the prior exam at which time it measured 3.8 x 1.4 cm. Index left axillary node previously measured 3.9 by 2.3 cm and now measures 2.9 by 2.0 cm. Decrease in pre-vascular lymph nodes are noted as well. The esophagus as visualized is within normal limits. Lungs/Pleura: Bilateral pleural effusions are noted left greater than right with associated lower lobe consolidation also left greater than right. No parenchymal nodule is seen. No pneumothorax is noted. Upper Abdomen: Visualized upper abdomen is within normal limits. Musculoskeletal: Noted changes of the thoracic spine are seen. Old healed sternal fracture is seen. Review of the MIP images confirms the above findings. IMPRESSION: Interval decrease in axillary and mediastinal lymph nodes when compared with the recent exam. No evidence of  pulmonary emboli. New large pericardial effusion when compared with prior CT from September of 2022. Bilateral pleural effusions with associated lower lobe consolidation left greater than right. Electronically Signed   By: MInez CatalinaM.D.   On: 10/02/2021 23:39   DG Chest Port 1 View  Result Date: 10/02/2021 CLINICAL DATA:  Sepsis EXAM: PORTABLE CHEST 1 VIEW COMPARISON:  09/29/2021 FINDINGS: Moderate cardiomegaly with left basilar opacities, which have worsened. Right  chest wall Port-A-Cath tip is at the cavoatrial junction. There is right basilar atelectasis. IMPRESSION: Worsening left basilar opacities concerning for infection. Electronically Signed   By: Ulyses Jarred M.D.   On: 10/02/2021 22:20    EKG: Independently reviewed. Sinus tachycardia, rate 130, low-voltage QRS.   Assessment/Plan   1. Pericardial effusion - Presents with worsening chest pain and SOB despite starting Levaquin 09/29/21 for pneumonia and found on CTA chest to have large pericardial effusion  - Low-voltage QRS and tachycardia on EKG  - He is afebrile but has leukocytosis and pulmonary infiltrates on chest CT; troponins normal   - BP stable  - Cardiology consulted and much appreciated, recommending echo for now   - Check echo, continue empiric antibiotics and IVF, close monitoring in stepdown unit    2. Sepsis secondary to PNA; acute hypoxic respiratory failure  - Presents with worsening chest pain and SOB despite starting Levaquin for PNA on 09/29/21  - Found to be tachycardic and tachypneic with increased WBC, mild hypoxia, and progressive airspace consolidations  - Blood cultures collected and antibiotics started in ED  - Check strep pneumo and legionella antigens, culture sputum, and treat with Rocephin and azithromycin for now    3. CLL - Has been on Gazyva and then started venetoclax last month but last dose was held d/t the current illness   4. Hyponatremia  - Serum sodium 130 on admission - Appears  hypovolemic; pneumonia and medications (venetoclax and Gazyva) could also be contributing  - Continue IVF hydration, repeat chem panel in am    5. Oropharyngeal candidiasis  - Improving, continue Nystatin    DVT prophylaxis: Lovenox   Code Status: Full  Level of Care: Level of care: Progressive Family Communication: wife updated at bedside  Disposition Plan:  Patient is from: Home  Anticipated d/c is to: TBD Anticipated d/c date is: 10/06/21 Patient currently: Pending echo, cardiology consultation, improvement in respiratory status  Consults called: Cardiology   Admission status: Inpatient     Vianne Bulls, MD [facility]  10/03/2021, 4:44 AM

## 2021-10-03 NOTE — Progress Notes (Signed)
Received sign out from cardiology fellow for day team to see in consult after echo done. Echo ordered stat per orders - have called on call echo tech to ensure they are aware to come in to do.

## 2021-10-03 NOTE — ED Notes (Signed)
Pt accepted to [facility] by Hospitialist. Waiting for bed assignment

## 2021-10-03 NOTE — Interval H&P Note (Signed)
History and Physical Interval Note:  10/03/2021 8:59 AM  Andres Mullins  has presented today for surgery, with the diagnosis of pericardiocentesis.  The various methods of treatment have been discussed with the patient and family. After consideration of risks, benefits and other options for treatment, the patient has consented to  Procedure(s): PERICARDIOCENTESIS (N/A) as a surgical intervention.  The patient's history has been reviewed, patient examined, no change in status, stable for surgery.  I have reviewed the patient's chart and labs.  Questions were answered to the patient's satisfaction.     Larae Grooms

## 2021-10-03 NOTE — Progress Notes (Signed)
Pharmacy Antibiotic Note  Andres Mullins is a 70 y.o. male admitted on 10/03/2021 with pneumonia.  Pharmacy has been consulted for Vancomycin  dosing.  Vancomycin 1 g IV given in ED at 2240  Plan: Vancomycin 750 mg IV q12h  Height: 5' 6"  (167.6 cm) Weight: 73.3 kg (161 lb 9.6 oz) IBW/kg (Calculated) : 63.8  Temp (24hrs), Avg:97.9 F (36.6 C), Min:97.5 F (36.4 C), Max:98.3 F (36.8 C)  Recent Labs  Lab 09/29/21 1126 10/02/21 2125 10/02/21 2207  WBC 12.5* 21.9*  --   CREATININE 0.77 0.69  --   LATICACIDVEN  --  1.1 0.9    Estimated Creatinine Clearance: 79.8 mL/min (by C-G formula based on SCr of 0.69 mg/dL).    Allergies  Allergen Reactions   Gluten Meal     Gluten intolerance     Caryl Pina 10/03/2021 4:44 AM

## 2021-10-03 NOTE — Consult Note (Addendum)
Cardiology Consultation:   Patient ID: Andres Mullins MRN: [id number]; DOB: [birth year]  Admit date: 10/03/2021 Date of Consult: 10/03/2021  PCP:  Andres Grana, PA-C   [facility] Providers Cardiologist:  Andres Bush, MD    (last seen [id number]) Click here to update MD or APP on Care Team, Refresh:1}     Patient Profile:   Andres Mullins is a 70 y.o. male with a hx of CLL, HLD, atrial tachycardia, aortic + coronary calcium by prior lung CA screening CT, ongoing tobacco abuse, celiac disease, vitamin D deficiency, psoriasis who is being seen 10/03/2021 for the evaluation of large pericardial effusion at the request of Andres Mullins.  History of Present Illness:   [name] remotely established with Dr. Saunders Mullins in 2017 for incidental pickup of coronary calcification on prior lung CA screening. He was also incidentally noted to have atrial tachycardia, asymptomatic at that time, started on beta blocker. Echo 2017 with normal EF, mildly dilated aortic root/ascending aorta. The patient ultimately stopped the metoprolol due to fatigue and feeling melancholy - last OV 2018. He has had CLL with prior treatment with Gazyva and then more recently started on venetoclax.   He recently saw his heme-onc team 09/29/21 with anticipated plan to increase venetoclax at which time he reported 3 day history of sore throat, fatigue, appetite changes, hot flashes and SOB for the past 3 days. He was tachycardic at that visit. His sore throat was felt due to thrush and he was started on nystatin. CXR was performed showing new airspace opacity at the left lung base suspicious for PNA. He was started on Levaquin. Over the weekend he began to develop intermittent dull chest pain, not specifically positional/pleuritic, and the sensation that he could not take a deep breath either. He thus presented to [facility] ED and was reported to be hypoxic as well. CXR showed worsening left basilar opacities concerning for infection. CT Angio  showed new large pericardial effusion when compared with prior CT from September of 2022, bilateral pleural effusions with associated lower lobe consolidation L>R, interval decrease in axillary and mediastinal lymph nodes when compared with the recent exam, no evidence of pulmonary emboli. Initial labs are pertinent for hyponatremia 130, leukocytosis 21.9 and thrombocytosis 495, INR 1.3, neg troponin x2, Covid/flu panel negative. BP has been ranging 119-147 systolic with HR [id number].  Denies any recent orthopnea, syncope, bleeding, weight change, known fevers or chills (just hot flashes). Has smoked since college. Father had bypass around his current age.  Past Medical History:  Diagnosis Date   Atherosclerosis of coronary artery 09/28/2016   Chest CT Nov 2017   Atrial tachycardia (HCC)    CLL (chronic lymphocytic leukemia) (HCC)    Elevated C-reactive protein (CRP)    Elevated PSA    Fractured sternum 1999   Herpes    History of rib fracture 1999   multiple   Hyperlipidemia    Psoriasis    Rheumatoid factor positive 02/2014   Rosacea    Thoracic aortic atherosclerosis ([place]) 09/28/2016   Chest CT Nov 2017   Tobacco abuse    Vitamin D deficiency disease     Past Surgical History:  Procedure Laterality Date   APPENDECTOMY  1974   PORTA CATH INSERTION N/A 06/06/2021   Procedure: PORTA CATH INSERTION;  Surgeon: Andres Huxley, MD;  Location: [place] [facility];  Service: Cardiovascular;  Laterality: N/A;   SHOULDER ARTHROSCOPY  10/23/2016   Procedure: ARTHROSCOPY SHOULDER WITH DISTAL CLAVICLE EXCISION, PARTIAL ACROMIONECTOMY,  AND DEBRIDEMENT;  Surgeon: Andres Leys, MD;  Location: [facility] ORS;  Service: Orthopedics;;   TIBIA FRACTURE SURGERY Left 2004   titanium rod, ORIF     Home Medications:  Prior to Admission medications   Medication Sig Start Date End Date Taking? Authorizing Provider  acetaminophen (TYLENOL) 325 MG tablet Take by mouth every 6 (six) hours as needed.     [provider]  acyclovir (ZOVIRAX) 400 MG tablet Take 1 tablet (400 mg total) by mouth 2 (two) times daily. 05/22/21   Andres Sickle, MD  allopurinol (ZYLOPRIM) 300 MG tablet Take 1 tablet (300 mg total) by mouth 2 (two) times daily. 07/21/21   Andres Sickle, MD  diphenhydrAMINE (BENADRYL) 25 MG tablet Take 25 mg by mouth every 6 (six) hours as needed.    [provider]  levofloxacin (LEVAQUIN) 500 MG tablet Take 1 tablet (500 mg total) by mouth daily. 09/29/21   Andres Mullins  lidocaine-prilocaine (EMLA) cream Apply 30 -45 mins prior to port access. 05/22/21   Andres Sickle, MD  montelukast (SINGULAIR) 10 MG tablet Take 1 tablet (10 mg total) by mouth at bedtime. Start 2 days prior to infusion. Do not take on the day of infusion. 05/22/21   Andres Sickle, MD  nystatin (MYCOSTATIN) 100000 UNIT/ML suspension Take 5 mLs (500,000 Units total) by mouth 4 (four) times daily. 09/29/21   Andres Mullins  venetoclax (VENCLEXTA) 10 & 50 & 100 MG Starter Pack Take by mouth daily. Take 20 mg for 7 days, then 50 mg daily x 7d, then 100 mg daily x 7d, then 200 mg daily x 7d. Take with food & water. 08/03/21   Andres Sickle, MD    Inpatient Medications: Scheduled Meds:  acyclovir  400 mg Oral BID   allopurinol  300 mg Oral BID   enoxaparin (LOVENOX) injection  40 mg Subcutaneous Daily   nystatin  5 mL Oral TID AC & HS   sodium chloride flush  3 mL Intravenous Q12H   Continuous Infusions:  sodium chloride     azithromycin     cefTRIAXone (ROCEPHIN)  IV     vancomycin 750 mg (10/03/21 0616)   PRN Meds: acetaminophen **OR** acetaminophen, morphine injection, ondansetron **OR** ondansetron (ZOFRAN) IV, senna-docusate  Allergies:    Allergies  Allergen Reactions   Gluten Meal     Gluten intolerance     Social History:   Social History   Socioeconomic History   Marital status: Married    Spouse name: Andres Mullins   Number of  children: Not on file   Years of education: 12   Highest education level: Bachelor's degree (e.g., BA, AB, BS)  Occupational History   Occupation: retired  Tobacco Use   Smoking status: Every Day    Packs/day: 0.50    Years: 45.00    Pack years: 22.50    Types: Cigarettes   Smokeless tobacco: Never   Tobacco comments:    11/6- currently smoking 5 cigarettes a day   Vaping Use   Vaping Use: Never used  Substance and Sexual Activity   Alcohol use: Yes    Alcohol/week: 3.0 standard drinks    Types: 3 Glasses of wine per week    Comment: a couple glasses of wine a week   Drug use: No   Sexual activity: Yes  Other Topics Concern   Not on file  Social History Narrative   Consumes ~4 cups of coffee/day  Social Determinants of Health   Financial Resource Strain: Not on file  Food Insecurity: Not on file  Transportation Needs: Not on file  Physical Activity: Not on file  Stress: Not on file  Social Connections: Not on file  Intimate Partner Violence: Not on file    Family History:    Family History  Problem Relation Age of Onset   Heart disease Father    Osteoarthritis Father    Alzheimer's disease Father    Cancer Neg Hx    Diabetes Neg Hx    Hypertension Neg Hx    Stroke Neg Hx    COPD Neg Hx      ROS:  Please see the history of present illness.  All other ROS reviewed and negative.     Physical Exam/Data:   Vitals:   10/03/21 0257 10/03/21 0300 10/03/21 0438  BP: 103/77 103/77 (!) 119/91  Pulse: (!) 117 87 (!) 115  Resp: (!) 34 (!) 36 (!) 39  Temp: 97.7 F (36.5 C) 97.7 F (36.5 C) (!) 97.5 F (36.4 C)  TempSrc: Oral  Oral  SpO2: 93% 93% 98%  Weight: 73.3 kg    Height: 5' 6"  (1.676 m)      Intake/Output Summary (Last 24 hours) at 10/03/2021 0802 Last data filed at 10/03/2021 0616 Gross per 24 hour  Intake 5 ml  Output --  Net 5 ml   Last 3 Weights 10/03/2021 10/02/2021 09/15/2021  Weight (lbs) 161 lb 9.6 oz 160 lb 162 lb  Weight (kg) 73.3 kg  72.576 kg 73.483 kg     Body mass index is 26.08 kg/m.  Limited exam during echo General: Well developed, well nourished, in no acute distress but mild increase WOB. Lying roughly 20 degrees in bed Head: Normocephalic, atraumatic, sclera non-icteric, no xanthomas, nares are without discharge. Neck: JVP not overtly elevated. Lungs: Diminished at bases without obvious wheezes, rales, or rhonchi. Speaks in 3-4 word sentences before pausing to breathe - increased RR Heart: Regular rhythm, tachycardic, S1 S2 without murmurs, rubs, or gallops.  Abdomen: Soft, non-tender, non-distended with normoactive bowel sounds. No rebound/guarding. Extremities: No clubbing or cyanosis. No edema. Distal pedal pulses are 2+ and equal bilaterally. Psoriatic plaques noted. Neuro: Alert and oriented X 3. Moves all extremities spontaneously. Psych:  Responds to questions appropriately with a normal affect.   EKG:  The EKG was personally reviewed and demonstrates:  sinus tach 103bpm, left axis deviation, low voltage QRS, poor R wave progression, nonspecific TW changes, no electrical alternans Telemetry:  Telemetry was personally reviewed and demonstrates:  sinus tach  Relevant CV Studies: 2D Echo 10/2016 - Left ventricle: The cavity size was normal. Systolic function was    normal. The estimated ejection fraction was in the range of 60%    to 65%. Wall motion was normal; there were no regional wall    motion abnormalities. Left ventricular diastolic function    parameters were normal.  - Aortic valve: There was trivial regurgitation.  - Aortic root: The aortic root was mildly dilated, 3.5 cm  - Ascending aorta: The ascending aorta was mildly dilated.  - Left atrium: The atrium was normal in size.  - Right ventricle: Systolic function was normal.  - Pulmonary arteries: Systolic pressure was within the normal    range.   Laboratory Data:  High Sensitivity Troponin:   Recent Labs  Lab 10/02/21 2125  10/02/21 2322  TROPONINIHS 5 6     Chemistry Recent Labs  Lab 09/29/21 1126  10/02/21 2125 10/03/21 0637  NA 132* 130* 133*  K 3.7 4.0 4.0  CL 97* 100 104  CO2 25 19* 21*  GLUCOSE 106* 119* 94  BUN 13 8 7*  CREATININE 0.77 0.69 0.68  CALCIUM 8.8* 8.9 8.6*  MG 2.2  --  1.8  GFRNONAA >60 >60 >60  ANIONGAP 10 11 8     Recent Labs  Lab 09/29/21 1126 10/02/21 2125  PROT 6.9 6.5  ALBUMIN 3.2* 3.0*  AST 17 15  ALT 17 13  ALKPHOS 78 59  BILITOT 0.8 1.1   Lipids No results for input(s): CHOL, TRIG, HDL, LABVLDL, LDLCALC, CHOLHDL in the last 168 hours.  Hematology Recent Labs  Lab 09/29/21 1126 10/02/21 2125 10/03/21 0637  WBC 12.5* 21.9* 17.0*  RBC 4.47 4.74 4.34  HGB 13.8 14.4 12.8*  HCT 39.5 41.0 38.5*  MCV 88.4 86.5 88.7  MCH 30.9 30.4 29.5  MCHC 34.9 35.1 33.2  RDW 13.8 13.9 14.0  PLT 408* 495* 441*   Thyroid No results for input(s): TSH, FREET4 in the last 168 hours.  BNPNo results for input(s): BNP, PROBNP in the last 168 hours.  DDimer No results for input(s): DDIMER in the last 168 hours.   Radiology/Studies:  DG Chest 2 View  Result Date: 09/29/2021 CLINICAL DATA:  Shortness of breath EXAM: CHEST - 2 VIEW COMPARISON:  CT chest abdomen pelvis 08/05/2021 FINDINGS: The heart size is within normal limits. No pulmonary vascular congestion. Patchy opacity at the left lung base is new since prior CT. Right IJ chest port terminates at the cavoatrial junction. Minimal bilateral pleural effusions are seen. IMPRESSION: New airspace opacity at the left lung base is suspicious for pneumonia. Electronically Signed   By: Miachel Roux M.D.   On: 09/29/2021 13:42   CT Angio Chest PE W and/or Wo Contrast  Result Date: 10/02/2021 CLINICAL DATA:  Chest pain and hypoxia, history of leukemia EXAM: CT ANGIOGRAPHY CHEST WITH CONTRAST TECHNIQUE: Multidetector CT imaging of the chest was performed using the standard protocol during bolus administration of intravenous contrast.  Multiplanar CT image reconstructions and MIPs were obtained to evaluate the vascular anatomy. CONTRAST:  8m OMNIPAQUE IOHEXOL 350 MG/ML SOLN COMPARISON:  Chest x-ray from earlier in the same day, CT from 08/05/2021. FINDINGS: Cardiovascular: Thoracic aorta and its branches are within normal limits without aneurysmal dilatation or dissection. Heart is not significantly enlarged although a large pericardial effusion is noted new from prior CT from 08/05/2021. The pulmonary artery shows no evidence of pulmonary embolism. Normal branching pattern is seen. No significant coronary calcifications are noted. Right-sided chest wall port is seen with catheter tip in the superior vena cava. Mediastinum/Nodes: Thoracic inlet is unremarkable. No sizable hilar or mediastinal adenopathy is noted. Small scattered lymph nodes are noted stable from the prior study. There are multiple axillary lymph nodes which appear enlarged but overall improved when compared with the prior study. Index axillary lymph node measures 2.8 by 0.8 cm in greatest dimension which is decreased from the prior exam at which time it measured 3.8 x 1.4 cm. Index left axillary node previously measured 3.9 by 2.3 cm and now measures 2.9 by 2.0 cm. Decrease in pre-vascular lymph nodes are noted as well. The esophagus as visualized is within normal limits. Lungs/Pleura: Bilateral pleural effusions are noted left greater than right with associated lower lobe consolidation also left greater than right. No parenchymal nodule is seen. No pneumothorax is noted. Upper Abdomen: Visualized upper abdomen is within normal limits. Musculoskeletal:  Noted changes of the thoracic spine are seen. Old healed sternal fracture is seen. Review of the MIP images confirms the above findings. IMPRESSION: Interval decrease in axillary and mediastinal lymph nodes when compared with the recent exam. No evidence of pulmonary emboli. New large pericardial effusion when compared with prior  CT from September of 2022. Bilateral pleural effusions with associated lower lobe consolidation left greater than right. Electronically Signed   By: Inez Catalina M.D.   On: 10/02/2021 23:39   DG Chest Port 1 View  Result Date: 10/02/2021 CLINICAL DATA:  Sepsis EXAM: PORTABLE CHEST 1 VIEW COMPARISON:  09/29/2021 FINDINGS: Moderate cardiomegaly with left basilar opacities, which have worsened. Right chest wall Port-A-Cath tip is at the cavoatrial junction. There is right basilar atelectasis. IMPRESSION: Worsening left basilar opacities concerning for infection. Electronically Signed   By: Ulyses Jarred M.D.   On: 10/02/2021 22:20     Assessment and Plan:   1. Large pericardial effusion - clinically tachycardic with intermittent low-normal BP and ongoing SOB - although BP is similar to recent baseline, clinical scenario concerning for cardiac tamponade - called for stat echo, have notified MD to assess patient for consideration of need for pericardiocentesis - etiology not clear at this time, consideration includes pericarditis related to acute infection, autoimmune disease, or malignancy  2. Acute hypoxic respiratory failure with concern for PNA - supplemental O2 - abx per IM - other medical issues include elevated PT/INR (15.9/1.3), oropharyngeal candidasis, hyponatremia, leukocytosis and thrombocytosis  3. Sinus tachycardia - likely reactive to #1, #2, follow with clinical management of these problems - TSH with AM labs for completeness  4. CLL - had been on Gazyva then started venetoclax last month, neither of which have specific correlation with pericardial effusions per UTD  5. Prior hx coronary artery calcifications - can revisit risk modification after acute illness improved  6. Ongoing tobacco abuse - cessation will be important  Risk Assessment/Risk Scores:      For questions or updates, please contact [place] Please consult [email or web address] for contact info under     Signed, Charlie Pitter, PA-C  10/03/2021 8:02 AM  Patient seen, examined. Available data reviewed. Agree with findings, assessment, and plan as outlined by Melina Copa, PA-C.  The patient is independently interviewed and examined.  He is alert, oriented, in mild respiratory distress with tachypnea.  The patient is on oxygen per nasal cannula.  HEENT is normal.  Neck veins are elevated with significant JVD.  Lungs are diminished in the bases.  Heart is tachycardic and regular with distant heart sounds, no murmur or gallop.  Abdomen is soft and nontender with no obvious organomegaly.  Extremities have no edema.  There plaques noted on the lower extremities.  Feet are warm and well-perfused.  Neurologic is grossly intact with strength 5/5 and equal bilaterally.  Affect is normal.  The patient's CT and echo images are personally reviewed.  He has a large circumferential pericardial effusion with diffuse thickening over the RV suggestive of fibrinous deposition.  He had no pericardial effusion on a recent chest CT in September of this year.  I suspect he has an inflammatory/infectious effusion in the setting of pneumonia and underlying CLL.  He does have early signs of tamponade by echo with greater than 25% respiratory variation in mitral inflow and marked variation in tricuspid inflow.  The patient is tachycardic and tachypneic.  I think emergent pericardiocentesis is indicated.  I have personally reviewed the risks, indications, and alternatives  with the patient and his wife.  They understand that risks of serious complication such as cardiac injury, myocardial infarction, bleeding, infection, subdiaphragmatic injury, emergency cardiac surgery, and death, occur at an incidence of approximately 1%.  They provide full informed consent for the procedure.  We will follow-up with the patient in the cardiac ICU after his pericardiocentesis is completed.  I personally discussed the patient's case with cardiac surgery  as well as interventional cardiology.  Sherren Mocha, M.D. 10/03/2021 8:21 AM

## 2021-10-03 NOTE — Progress Notes (Signed)
CVICU nurse had some concern for tachypnea and softer blood pressures after pericardiocentesis today.  I examined the patient and he appears relatively stable though dyspneic.  He claims this is better than prior to procedure, though.  I performed a bedside cardiac ultrasound without residual effusion, no variation in tricuspid/mitral inflows, and small collapsible IVC inconsistent with recurrent effusion or tamponade.  Will continue to monitor.  Nurse to notify if persistent concerns.  Andres [place], MD Cardiology Fellow [facility]

## 2021-10-03 NOTE — Progress Notes (Signed)
[facility] PROGRESS NOTE    Progress Note  Andres Mullins  QPY:[id number] DOB: 08-02-1951 DOA: 10/03/2021 PCP: [name], PA-C     Brief Narrative:   Andres Mullins is an 70 y.o. 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  White count of 17,000 (has been hovering around 12 with her CLL) she was started empirically on cefepime and vancomycin blood cultures were ordered CT surgeon recommended transfer to [facility] for possible pericardiocentesis  Assessment/Plan:   Pericardial effusion: 2D echo results are pending.  Concerned about cardiac tamponade the systolic blood pressure did drop with inspiration and neck veins are up. Have notified cardiology. Started empirically on antibiotics, we will go ahead and increase his IV fluids he is tachycardic, blood pressure dropped with inspiration. CT angio of the chest showing increased axillary ministerial lymphadenopathy with a new large pericardial effusion compared to previous CT scan.  New bilateral pleural effusions associated with lower lobe consolidation left greater than right. he was started empirically on Vanco and cefepime.  Possible sepsis secondary to community-acquired pneumonia/acute respiratory failure with hypoxia: Imaging showed airspace consolidation with a white count increased from her baseline and mild hypoxia. Blood cultures collected started empirically on vancomycin and cefepime.  CLL: On Gazyva and last month started on venetoclax.  Hyponatremia: Likely due to decreased cardiac output increases normal saline basic metabolic  panels pending this morning.  Oropharyngeal candidiasis: Continue nystatin flushes.  DVT prophylaxis: none Family Communication:Wife Status is: Inpatient  Remains inpatient appropriate because: Acute severity of illness possible cardiac tamponade.    Code Status:     Code Status Orders  (From admission, onward)           Start     Ordered   10/03/21 0436  Full code  Continuous        10/03/21 0440           Code Status History     Date Active Date Inactive Code Status Order ID Comments User Context   09/07/2021 1755 09/08/2021 2229 Full Code [id number]  Lequita Halt, MD Inpatient         IV Access:   Peripheral IV   Procedures and diagnostic studies:   CT Angio Chest PE W and/or Wo Contrast  Result Date: 10/02/2021 CLINICAL DATA:  Chest pain and hypoxia, history of leukemia EXAM: CT ANGIOGRAPHY CHEST WITH CONTRAST TECHNIQUE: Multidetector CT imaging of the chest was performed using the standard protocol during bolus administration of intravenous contrast. Multiplanar CT image reconstructions and MIPs were obtained to evaluate the vascular anatomy. CONTRAST:  12m OMNIPAQUE IOHEXOL 350 MG/ML SOLN COMPARISON:  Chest x-ray from earlier in the same day, CT from 08/05/2021. FINDINGS: Cardiovascular: Thoracic aorta and its branches are within normal limits without aneurysmal dilatation or dissection. Heart is not significantly enlarged although a large pericardial effusion is noted new from prior CT from 08/05/2021. The pulmonary artery shows no evidence of pulmonary embolism. Normal branching pattern is seen. No significant coronary calcifications are noted. Right-sided chest wall port is seen with catheter tip in the superior vena cava. Mediastinum/Nodes: Thoracic inlet is unremarkable.  No sizable hilar or mediastinal adenopathy is noted. Small scattered lymph nodes are noted stable from the prior study. There are multiple axillary lymph nodes which appear enlarged but  overall improved when compared with the prior study. Index axillary lymph node measures 2.8 by 0.8 cm in greatest dimension which is decreased from the prior exam at which time it measured 3.8 x 1.4 cm. Index left axillary node previously measured 3.9 by 2.3 cm and now measures 2.9 by 2.0 cm. Decrease in pre-vascular lymph nodes are noted as well. The esophagus as visualized is within normal limits. Lungs/Pleura: Bilateral pleural effusions are noted left greater than right with associated lower lobe consolidation also left greater than right. No parenchymal nodule is seen. No pneumothorax is noted. Upper Abdomen: Visualized upper abdomen is within normal limits. Musculoskeletal: Noted changes of the thoracic spine are seen. Old healed sternal fracture is seen. Review of the MIP images confirms the above findings. IMPRESSION: Interval decrease in axillary and mediastinal lymph nodes when compared with the recent exam. No evidence of pulmonary emboli. New large pericardial effusion when compared with prior CT from September of 2022. Bilateral pleural effusions with associated lower lobe consolidation left greater than right. Electronically Signed   By: Inez Catalina M.D.   On: 10/02/2021 23:39   DG Chest Port 1 View  Result Date: 10/02/2021 CLINICAL DATA:  Sepsis EXAM: PORTABLE CHEST 1 VIEW COMPARISON:  09/29/2021 FINDINGS: Moderate cardiomegaly with left basilar opacities, which have worsened. Right chest wall Port-A-Cath tip is at the cavoatrial junction. There is right basilar atelectasis. IMPRESSION: Worsening left basilar opacities concerning for infection. Electronically Signed   By: Ulyses Jarred M.D.   On: 10/02/2021 22:20     Medical Consultants:   None.   Subjective:    Andres Mullins relates his chest pressure is not improved.  Objective:    Vitals:   10/03/21 0257 10/03/21 0300 10/03/21 0438  BP: 103/77 103/77 (!) 119/91  Pulse: (!) 117 87 (!) 115  Resp: (!) 34 (!) 36 (!) 39   Temp: 97.7 F (36.5 C) 97.7 F (36.5 C) (!) 97.5 F (36.4 C)  TempSrc: Oral  Oral  SpO2: 93% 93% 98%  Weight: 73.3 kg    Height: 5' 6"  (1.676 m)     SpO2: 98 % O2 Flow Rate (L/min): 2 L/min   Intake/Output Summary (Last 24 hours) at 10/03/2021 0717 Last data filed at 10/03/2021 0616 Gross per 24 hour  Intake 5 ml  Output --  Net 5 ml   Filed Weights   10/03/21 0257  Weight: 73.3 kg    Exam: General exam: In no acute distress. Respiratory system: Good air movement and crackles at bases but more predominantly on the right. Cardiovascular system: S1 & S2 heard, RRR.  Positive JVD Gastrointestinal system: Abdomen is nondistended, soft and nontender.  Extremities: No pedal edema. Skin: No rashes, lesions or ulcers Psychiatry: Judgement and insight appear normal. Mood & affect appropriate.    Data Reviewed:    Labs: Basic Metabolic Panel: Recent Labs  Lab 09/29/21 1126 10/02/21 2125  NA 132* 130*  K 3.7 4.0  CL 97* 100  CO2 25 19*  GLUCOSE 106* 119*  BUN 13 8  CREATININE 0.77 0.69  CALCIUM 8.8* 8.9  MG 2.2  --   PHOS 4.2  --    GFR Estimated Creatinine Clearance: 79.8 mL/min (by C-G formula based on SCr of 0.69 mg/dL). Liver Function Tests: Recent Labs  Lab 09/29/21 1126 10/02/21  2125  AST 17 15  ALT 17 13  ALKPHOS 78 59  BILITOT 0.8 1.1  PROT 6.9 6.5  ALBUMIN 3.2* 3.0*   No results for input(s): LIPASE, AMYLASE in the last 168 hours. No results for input(s): AMMONIA in the last 168 hours. Coagulation profile Recent Labs  Lab 10/02/21 2125  INR 1.3*   COVID-19 Labs  No results for input(s): DDIMER, FERRITIN, LDH, CRP in the last 72 hours.  Lab Results  Component Value Date   SARSCOV2NAA NEGATIVE 10/02/2021   [place] NEGATIVE 09/07/2021    CBC: Recent Labs  Lab 09/29/21 1126 10/02/21 2125 10/03/21 0637  WBC 12.5* 21.9* 17.0*  NEUTROABS 10.3* 18.5*  --   HGB 13.8 14.4 12.8*  HCT 39.5 41.0 38.5*  MCV 88.4 86.5 88.7  PLT 408*  495* 441*   Cardiac Enzymes: No results for input(s): CKTOTAL, CKMB, CKMBINDEX, TROPONINI in the last 168 hours. BNP (last 3 results) No results for input(s): PROBNP in the last 8760 hours. CBG: No results for input(s): GLUCAP in the last 168 hours. D-Dimer: No results for input(s): DDIMER in the last 72 hours. Hgb A1c: No results for input(s): HGBA1C in the last 72 hours. Lipid Profile: No results for input(s): CHOL, HDL, LDLCALC, TRIG, CHOLHDL, LDLDIRECT in the last 72 hours. Thyroid function studies: No results for input(s): TSH, T4TOTAL, T3FREE, THYROIDAB in the last 72 hours.  Invalid input(s): FREET3 Anemia work up: No results for input(s): VITAMINB12, FOLATE, FERRITIN, TIBC, IRON, RETICCTPCT in the last 72 hours. Sepsis Labs: Recent Labs  Lab 09/29/21 1126 10/02/21 2125 10/02/21 2207 10/03/21 0637  WBC 12.5* 21.9*  --  17.0*  LATICACIDVEN  --  1.1 0.9  --    Microbiology Recent Results (from the past 240 hour(s))  Resp Panel by RT-PCR (Flu A&B, Covid) Nasopharyngeal Swab     Status: None   Collection Time: 10/02/21 10:07 PM   Specimen: Nasopharyngeal Swab; Nasopharyngeal(NP) swabs in vial transport medium  Result Value Ref Range Status   SARS Coronavirus 2 by RT PCR NEGATIVE NEGATIVE Final    Comment: (NOTE) SARS-CoV-2 target nucleic acids are NOT DETECTED.  The SARS-CoV-2 RNA is generally detectable in upper respiratory specimens during the acute phase of infection. The lowest concentration of SARS-CoV-2 viral copies this assay can detect is 138 copies/mL. A negative result does not preclude SARS-Cov-2 infection and should not be used as the sole basis for treatment or other patient management decisions. A negative result may occur with  improper specimen collection/handling, submission of specimen other than nasopharyngeal swab, presence of viral mutation(s) within the areas targeted by this assay, and inadequate number of viral copies(<138 copies/mL). A  negative result must be combined with clinical observations, patient history, and epidemiological information. The expected result is Negative.  Fact Sheet for Patients:  EntrepreneurPulse.com.au  Fact Sheet for Healthcare Providers:  IncredibleEmployment.be  This test is no t yet approved or cleared by the [place] FDA and  has been authorized for detection and/or diagnosis of SARS-CoV-2 by FDA under an Emergency Use Authorization (EUA). This EUA will remain  in effect (meaning this test can be used) for the duration of the COVID-19 declaration under Section 564(b)(1) of the Act, 21 U.S.C.section 360bbb-3(b)(1), unless the authorization is terminated  or revoked sooner.       Influenza A by PCR NEGATIVE NEGATIVE Final   Influenza B by PCR NEGATIVE NEGATIVE Final    Comment: (NOTE) The Xpert Xpress SARS-CoV-2/FLU/RSV plus assay is intended as an  aid in the diagnosis of influenza from Nasopharyngeal swab specimens and should not be used as a sole basis for treatment. Nasal washings and aspirates are unacceptable for Xpert Xpress SARS-CoV-2/FLU/RSV testing.  Fact Sheet for Patients: EntrepreneurPulse.com.au  Fact Sheet for Healthcare Providers: IncredibleEmployment.be  This test is not yet approved or cleared by the [place] FDA and has been authorized for detection and/or diagnosis of SARS-CoV-2 by FDA under an Emergency Use Authorization (EUA). This EUA will remain in effect (meaning this test can be used) for the duration of the COVID-19 declaration under Section 564(b)(1) of the Act, 21 U.S.C. section 360bbb-3(b)(1), unless the authorization is terminated or revoked.  Performed at [facility], [address] [place], [place] [address]      Medications:    acyclovir  400 mg Oral BID   allopurinol  300 mg Oral BID   enoxaparin (LOVENOX) injection  40 mg Subcutaneous Daily    nystatin  5 mL Oral TID AC & HS   sodium chloride flush  3 mL Intravenous Q12H   Continuous Infusions:  azithromycin     cefTRIAXone (ROCEPHIN)  IV     lactated ringers 125 mL/hr at 10/03/21 0616   vancomycin 750 mg (10/03/21 0616)      LOS: 0 days   Andres Mullins  [facility]  10/03/2021, 7:17 AM

## 2021-10-03 NOTE — Progress Notes (Signed)
  Echocardiogram 2D Echocardiogram has been performed.  Andres Mullins 10/03/2021, 7:39 AM

## 2021-10-03 NOTE — Progress Notes (Signed)
Patient: Andres Mullins, Andres Mullins   DOB: 01/18/2051 VZC:[id number] Transferring facility: [facility] ED Requesting provider: Dr. Archie Balboa ([facility]) Reason for transfer: admission for further evaluation and management of sepsis due to community-acquired pneumonia   70 year old male with history of CLL, who presented to [facility] ED on 10/02/2021 complaining of 4 to 5 days of shortness of breath.  SOB started around 09/28/2021, prompting pt to present to his PCP on 09/30/2021, at which time chest x-ray was reportedly suggestive of left lower lobe infiltrate, prompting initiation of oral antibiotic for CAP.  In spite of good compliance with interval po antibiotic, patient presented to [facility] ED on 10/02/2021 with interval worsening of shortness of breath associated with reported pleuritic chest discomfort. Chest x-ray suggestive of interval worsening of left lower lobe infiltrate.  In setting of patient's history of malignancy, CTA chest was obtained and confirmed left lower lobe infiltrate, while also showing a large pericardial effusion, which was reportedly new relative to imaging from September 2022.    Initial heart rate in the 130s, reported to be sinus tachycardia, which subsequently improved into the 110's following interval IV fluids and initiation of antibiotics, while initial O2 sat 90%, prompting initiation of 2 L nasal cannula which ensuing O2 sats 94 to 95%.  Normotensive throughout, with most recent BP noted to be 122/85.  Labs notable for leukocytosis of 21,000, lactic acid nonelevated, COVID-19/influenza negative.  Blood cultures x2 collected. Started on IV vancomycin and cefepime.   In the setting of interval pericardial effusion, EDP discussed case/imaging with CT surgery at [facility] (Dr. Roxan Hockey) who recommended that patient go to a facility capable of performing paracentesis, in case this subsequently becomes necessary. Dr. Roxan Hockey recommends consultation of cardiology upon arrival and is available if further  assistance is needed.   Subsequently, I accepted this patient for transfer for inpatient admission to pcu bed at [facility]   for further work-up and management of sepsis due to community-acquired pneumonia, with additional acute medical problems requiring further evaluation to include the new diagnosis of large pericardial effusion.        Babs Bertin, DO Hospitalist

## 2021-10-04 ENCOUNTER — Inpatient Hospital Stay (HOSPITAL_COMMUNITY): Payer: Medicare PPO

## 2021-10-04 DIAGNOSIS — I3139 Other pericardial effusion (noninflammatory): Secondary | ICD-10-CM | POA: Diagnosis not present

## 2021-10-04 LAB — PATHOLOGIST SMEAR REVIEW

## 2021-10-04 LAB — CBC
HCT: 35.5 % — ABNORMAL LOW (ref 39.0–52.0)
Hemoglobin: 12.1 g/dL — ABNORMAL LOW (ref 13.0–17.0)
MCH: 30 pg (ref 26.0–34.0)
MCHC: 34.1 g/dL (ref 30.0–36.0)
MCV: 88.1 fL (ref 80.0–100.0)
Platelets: 429 10*3/uL — ABNORMAL HIGH (ref 150–400)
RBC: 4.03 MIL/uL — ABNORMAL LOW (ref 4.22–5.81)
RDW: 14 % (ref 11.5–15.5)
WBC: 13.5 10*3/uL — ABNORMAL HIGH (ref 4.0–10.5)
nRBC: 0 % (ref 0.0–0.2)

## 2021-10-04 LAB — ECHOCARDIOGRAM LIMITED
Height: 66 in
Weight: 2627.88 oz

## 2021-10-04 LAB — PROTEIN, BODY FLUID (OTHER): Total Protein, Body Fluid Other: 4.1 g/dL

## 2021-10-04 LAB — TSH: TSH: 1.237 u[IU]/mL (ref 0.350–4.500)

## 2021-10-04 LAB — BASIC METABOLIC PANEL
Anion gap: 10 (ref 5–15)
BUN: 11 mg/dL (ref 8–23)
CO2: 19 mmol/L — ABNORMAL LOW (ref 22–32)
Calcium: 8.3 mg/dL — ABNORMAL LOW (ref 8.9–10.3)
Chloride: 102 mmol/L (ref 98–111)
Creatinine, Ser: 0.68 mg/dL (ref 0.61–1.24)
GFR, Estimated: >60 mL/min (ref >60)
Glucose, Bld: 89 mg/dL (ref 70–99)
Potassium: 3.7 mmol/L (ref 3.5–5.1)
Sodium: 131 mmol/L — ABNORMAL LOW (ref 135–145)

## 2021-10-04 LAB — GLUCOSE, BODY FLUID OTHER: Glucose, Body Fluid Other: 24 mg/dL

## 2021-10-04 LAB — CYTOLOGY - NON PAP

## 2021-10-04 LAB — LD, BODY FLUID (OTHER): LD, Body Fluid: 643 IU/L

## 2021-10-04 MED ORDER — COLCHICINE 0.6 MG PO TABS
0.6000 mg | ORAL_TABLET | Freq: Every day | ORAL | Status: DC
  Administered 2021-10-04 – 2021-10-05 (×2): 0.6 mg via ORAL
  Filled 2021-10-04 (×2): qty 1

## 2021-10-04 MED ORDER — MORPHINE SULFATE (PF) 2 MG/ML IV SOLN
2.0000 mg | INTRAVENOUS | Status: DC | PRN
Start: 2021-10-04 — End: 2021-10-05
  Administered 2021-10-04: 2 mg via INTRAVENOUS
  Filled 2021-10-04: qty 1

## 2021-10-04 MED ORDER — COLCHICINE 0.6 MG PO TABS
0.6000 mg | ORAL_TABLET | Freq: Two times a day (BID) | ORAL | Status: DC
Start: 2021-10-04 — End: 2021-10-04
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
## 2021-10-04 NOTE — Progress Notes (Signed)
  Echocardiogram 2D Echocardiogram has been performed.  Andres Mullins F 10/04/2021, 10:52 AM

## 2021-10-04 NOTE — Evaluation (Signed)
Physical Therapy Evaluation Patient Details Name: Andres Mullins MRN: [id number] DOB: [birth year] Today's Date: 10/04/2021  History of Present Illness  Pt adm 11/06 with chest pain and SOB. Pt found to have large pericardial effusion and underwent emergent pericardiocentesis on 11/7. Pt also with possible sepsis due to community acquired  PNA. 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  Encouraged pt to amb in hallways throughout stay.         Recommendations for follow up therapy are one component of a multi-disciplinary discharge planning process, led by the attending physician.  Recommendations may be updated based on patient status, additional functional criteria and insurance authorization.  Follow Up Recommendations No PT follow up    Assistance Recommended at Discharge None  Functional Status Assessment Patient has not had a recent decline in their functional status  Equipment Recommendations       Recommendations for Other Services       Precautions / Restrictions Precautions Precautions: None      Mobility  Bed Mobility Overal bed mobility: Modified Independent                  Transfers Overall transfer level: Modified independent Equipment used: None                    Ambulation/Gait Ambulation/Gait assistance: Modified independent (Device/Increase time) Gait Distance (Feet): 700 Feet Assistive device: None Gait Pattern/deviations: WFL(Within Functional Limits) Gait velocity: adequate Gait velocity interpretation: >2.62 ft/sec, indicative of community ambulatory   General Gait Details: Steady gait. Dyspnea 2/4.  Stairs            Wheelchair Mobility    Modified Rankin (Stroke Patients Only)       Balance Overall balance assessment: No apparent balance deficits (not formally assessed)                                            Pertinent Vitals/Pain Pain Assessment: No/denies pain    Home Living Family/patient expects to be discharged to:: Private residence Living Arrangements: Spouse/significant other Available Help at Discharge: Family Type of Home: House       Alternate Level Stairs-Number of Steps: flight Home Layout: Two level;Able to live on main level with bedroom/bathroom Home Equipment: None      Prior Function Prior Level of Function : Independent/Modified Independent             Mobility Comments: retired Market researcher        Extremity/Trunk Assessment   Upper Extremity Assessment Upper Extremity Assessment: Overall WFL for tasks assessed    Lower Extremity Assessment Lower Extremity Assessment: Overall WFL for tasks assessed    Cervical / Trunk Assessment Cervical / Trunk Assessment: Normal  Communication   Communication: No difficulties  Cognition Arousal/Alertness: Awake/alert Behavior During Therapy: WFL for tasks assessed/performed Overall Cognitive Status: Within Functional Limits for tasks assessed                                          General Comments General comments (skin integrity, edema, etc.): VSS    Exercises     Assessment/Plan    PT  Assessment Patient does not need any further PT services  PT Problem List         PT Treatment Interventions      PT Goals (Current goals can be found in the Care Plan section)  Acute Rehab PT Goals PT Goal Formulation: All assessment and education complete, DC therapy    Frequency     Barriers to discharge        Co-evaluation               AM-PAC PT "6 Clicks" Mobility  Outcome Measure Help needed turning from your back to your side while in a flat bed without using bedrails?: None Help needed moving from lying on your back to sitting on the side of a flat bed without using bedrails?: None Help needed moving to and from a bed to a chair (including a  wheelchair)?: None Help needed standing up from a chair using your arms (e.g., wheelchair or bedside chair)?: None Help needed to walk in hospital room?: None Help needed climbing 3-5 steps with a railing? : None 6 Click Score: 24    End of Session   Activity Tolerance: Patient tolerated treatment well Patient left: in chair;with call bell/phone within reach;with family/visitor present Nurse Communication: Mobility status PT Visit Diagnosis: Other abnormalities of gait and mobility (R26.89)    Time: 0626-9485 PT Time Calculation (min) (ACUTE ONLY): 14 min   Charges:   PT Evaluation $PT Eval Low Complexity: 1 Low          [place] Pager [phone] Office [place] 10/04/2021, 11:54 AM

## 2021-10-04 NOTE — Progress Notes (Signed)
Given the recent unexpected hospitalization, I tried to reach the patient's wife. Left a voicemail to call us back.

## 2021-10-04 NOTE — Progress Notes (Signed)
Pt admitted to [facility], VS wnL and as per flow. No SOB. 96% on RA. Pt oriented to 6E processes. Pt familiar with the [facility] system. All questions and concerns addressed. Call bell placed within reach, will continue to monitor and maintain safety.
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
## 2021-10-05 ENCOUNTER — Inpatient Hospital Stay: Payer: Medicare PPO | Admitting: Internal Medicine

## 2021-10-05 ENCOUNTER — Other Ambulatory Visit (HOSPITAL_COMMUNITY): Payer: Self-pay

## 2021-10-05 ENCOUNTER — Inpatient Hospital Stay: Payer: Medicare PPO

## 2021-10-05 DIAGNOSIS — I3139 Other pericardial effusion (noninflammatory): Secondary | ICD-10-CM | POA: Diagnosis not present

## 2021-10-05 DIAGNOSIS — Z8679 Personal history of other diseases of the circulatory system: Secondary | ICD-10-CM

## 2021-10-05 DIAGNOSIS — Z720 Tobacco use: Secondary | ICD-10-CM

## 2021-10-05 LAB — BASIC METABOLIC PANEL
Anion gap: 9 (ref 5–15)
BUN: 9 mg/dL (ref 8–23)
CO2: 23 mmol/L (ref 22–32)
Calcium: 8.4 mg/dL — ABNORMAL LOW (ref 8.9–10.3)
Chloride: 101 mmol/L (ref 98–111)
Creatinine, Ser: 0.64 mg/dL (ref 0.61–1.24)
GFR, Estimated: >60 mL/min (ref >60)
Glucose, Bld: 89 mg/dL (ref 70–99)
Potassium: 3.7 mmol/L (ref 3.5–5.1)
Sodium: 133 mmol/L — ABNORMAL LOW (ref 135–145)

## 2021-10-05 LAB — CBC
HCT: 35 % — ABNORMAL LOW (ref 39.0–52.0)
Hemoglobin: 11.9 g/dL — ABNORMAL LOW (ref 13.0–17.0)
MCH: 29.9 pg (ref 26.0–34.0)
MCHC: 34 g/dL (ref 30.0–36.0)
MCV: 87.9 fL (ref 80.0–100.0)
Platelets: 476 10*3/uL — ABNORMAL HIGH (ref 150–400)
RBC: 3.98 MIL/uL — ABNORMAL LOW (ref 4.22–5.81)
RDW: 13.9 % (ref 11.5–15.5)
WBC: 11.6 10*3/uL — ABNORMAL HIGH (ref 4.0–10.5)
nRBC: 0 % (ref 0.0–0.2)

## 2021-10-05 MED ORDER — AZITHROMYCIN 250 MG PO TABS
250.0000 mg | ORAL_TABLET | Freq: Every day | ORAL | 0 refills | Status: AC
  Filled 2021-10-05: qty 2, 2d supply, fill #0

## 2021-10-05 MED ORDER — COLCHICINE 0.6 MG PO CAPS
0.6000 mg | ORAL_CAPSULE | Freq: Every day | ORAL | 0 refills | Status: DC
  Filled 2021-10-05: qty 30, 30d supply, fill #0

## 2021-10-05 MED ORDER — CEFDINIR 300 MG PO CAPS
300.0000 mg | ORAL_CAPSULE | Freq: Two times a day (BID) | ORAL | 0 refills | Status: AC
  Filled 2021-10-05: qty 8, 4d supply, fill #0
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
## 2021-10-05 NOTE — TOC Benefit Eligibility Note (Signed)
Patient Teacher, English as a foreign language completed.    The patient is currently admitted and upon discharge could be taking Mitigare 0.6 mg Caps.  The current 30 day co-pay is, $40.00.   The patient is currently admitted and upon discharge could be taking Colchicine 0.6 mg Tablets.  Not Covered  The patient is insured through [place] Patient Advocate Specialist [place] Patient Advocate Team Direct Number: [phone]  Fax: [phone]

## 2021-10-05 NOTE — Discharge Summary (Signed)
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 patient history, and epidemiological information. The expected result is Negative.  Fact Sheet for Patients:  EntrepreneurPulse.com.au  Fact Sheet for Healthcare Providers:  IncredibleEmployment.be  This test is no t yet approved or cleared by the [place] FDA and  has been authorized for detection and/or diagnosis of SARS-CoV-2 by FDA under an Emergency Use Authorization (EUA). This EUA will remain  in effect (meaning this test can be used) for the duration of the COVID-19 declaration under Section 564(b)(1) of the Act, 21 U.S.C.section 360bbb-3(b)(1), unless the authorization is terminated  or revoked sooner.       Influenza A by PCR NEGATIVE NEGATIVE Final   Influenza B by PCR NEGATIVE NEGATIVE Final    Comment: (NOTE) The Xpert Xpress SARS-CoV-2/FLU/RSV plus assay is intended as an aid in the diagnosis of influenza  from Nasopharyngeal swab specimens and should not be used as a sole basis for treatment. Nasal washings and aspirates are unacceptable for Xpert Xpress SARS-CoV-2/FLU/RSV testing.  Fact Sheet for Patients: EntrepreneurPulse.com.au  Fact Sheet for Healthcare Providers: IncredibleEmployment.be  This test is not yet approved or cleared by the [place] FDA and has been authorized for detection and/or diagnosis of SARS-CoV-2 by FDA under an Emergency Use Authorization (EUA). This EUA will remain in effect (meaning this test can be used) for the duration of the COVID-19 declaration under Section 564(b)(1) of the Act, 21 U.S.C. section 360bbb-3(b)(1), unless the authorization is terminated or revoked.  Performed at [facility], [address] [place], [place] [address]   MRSA Next Gen by PCR, Nasal     Status: None   Collection Time: 10/03/21  8:00 AM   Specimen: Nasal Mucosa; Nasal Swab  Result Value Ref Range Status   MRSA by PCR Next Gen NOT  DETECTED NOT DETECTED Final    Comment: (NOTE) The GeneXpert MRSA Assay (FDA approved for NASAL specimens only), is one component of a comprehensive MRSA colonization surveillance program. It is not intended to diagnose MRSA infection nor to guide or monitor treatment for MRSA infections. Test performance is not FDA approved in patients less than 63 years old. Performed at [facility], [place] [address], [place], [place] [address]   Culture, body fluid w Gram Stain-bottle     Status: None (Preliminary result)   Collection Time: 10/03/21 10:07 AM   Specimen: Pericardial  Result Value Ref Range Status   Specimen Description PERICARDIAL  Final   Special Requests NONE  Final   Culture   Final    NO GROWTH 2 DAYS Performed at [facility] [address] [place], [place] [address]    Report Status PENDING  Incomplete  Gram stain     Status: None   Collection Time: 10/03/21 10:07  AM   Specimen: Pericardial  Result Value Ref Range Status   Specimen Description PERICARDIAL  Final   Special Requests NONE  Final   Gram Stain   Final    WBC PRESENT, PREDOMINANTLY PMN NO ORGANISMS SEEN CYTOSPIN SMEAR Performed at [facility], [place] [address], [place], [place] [address]    Report Status 10/03/2021 FINAL  Final    Time coordinating discharge: Approximately 40 minutes  Patrecia Pour, MD  [facility] 10/05/2021, 11:07 AM

## 2021-10-05 NOTE — Progress Notes (Signed)
Discharge packet provided with teach-back method. Pt is an ex-RN & educator, very knowledgeable and familiar with the [facility] system. Waiting on meds for final discharge. VS wnL and as per flow. Pt verbalized understanding. All questions and concerns addressed.

## 2021-10-06 ENCOUNTER — Telehealth: Payer: Self-pay

## 2021-10-06 NOTE — Telephone Encounter (Signed)
Transition Care Management Unsuccessful Follow-up Telephone Call  Date of discharge and from where:  10[facility]05/[facility]  Attempts:  1st Attempt  Reason for unsuccessful TCM follow-up call:  Left voice message

## 2021-10-07 LAB — CULTURE, BLOOD (ROUTINE X 2)
Culture: NO GROWTH
Culture: NO GROWTH
Special Requests: ADEQUATE

## 2021-10-07 NOTE — Telephone Encounter (Signed)
Transition Care Management Unsuccessful Follow-up Telephone Call  Date of discharge and from where:  10/[facility]  Attempts:  2nd Attempt  Reason for unsuccessful TCM follow-up call:  Left voice message.  No hosp fu appt with PCP. Pt scheduled to follow up with oncology

## 2021-10-08 LAB — CULTURE, BODY FLUID W GRAM STAIN -BOTTLE: Culture: NO GROWTH

## 2021-10-10 ENCOUNTER — Other Ambulatory Visit: Payer: Self-pay

## 2021-10-10 ENCOUNTER — Inpatient Hospital Stay: Payer: Medicare PPO

## 2021-10-10 ENCOUNTER — Inpatient Hospital Stay (HOSPITAL_BASED_OUTPATIENT_CLINIC_OR_DEPARTMENT_OTHER): Payer: Medicare PPO | Admitting: Nurse Practitioner

## 2021-10-10 ENCOUNTER — Other Ambulatory Visit (HOSPITAL_COMMUNITY): Payer: Self-pay

## 2021-10-10 VITALS — BP 111/84 | HR 98 | Temp 96.0°F | Resp 18 | Wt 154.4 lb

## 2021-10-10 DIAGNOSIS — C9110 Chronic lymphocytic leukemia of B-cell type not having achieved remission: Secondary | ICD-10-CM | POA: Diagnosis not present

## 2021-10-10 DIAGNOSIS — B370 Candidal stomatitis: Secondary | ICD-10-CM | POA: Diagnosis not present

## 2021-10-10 DIAGNOSIS — R0602 Shortness of breath: Secondary | ICD-10-CM | POA: Diagnosis not present

## 2021-10-10 DIAGNOSIS — I3139 Other pericardial effusion (noninflammatory): Secondary | ICD-10-CM | POA: Diagnosis not present

## 2021-10-10 DIAGNOSIS — R5383 Other fatigue: Secondary | ICD-10-CM | POA: Diagnosis not present

## 2021-10-10 DIAGNOSIS — Z95828 Presence of other vascular implants and grafts: Secondary | ICD-10-CM

## 2021-10-10 DIAGNOSIS — J029 Acute pharyngitis, unspecified: Secondary | ICD-10-CM | POA: Diagnosis not present

## 2021-10-10 DIAGNOSIS — Z79899 Other long term (current) drug therapy: Secondary | ICD-10-CM | POA: Diagnosis not present

## 2021-10-10 LAB — MAGNESIUM: Magnesium: 2.3 mg/dL (ref 1.7–2.4)

## 2021-10-10 LAB — COMPREHENSIVE METABOLIC PANEL
ALT: 23 U/L (ref 0–44)
AST: 18 U/L (ref 15–41)
Albumin: 3.6 g/dL (ref 3.5–5.0)
Alkaline Phosphatase: 73 U/L (ref 38–126)
Anion gap: 9 (ref 5–15)
BUN: 11 mg/dL (ref 8–23)
CO2: 27 mmol/L (ref 22–32)
Calcium: 9.6 mg/dL (ref 8.9–10.3)
Chloride: 99 mmol/L (ref 98–111)
Creatinine, Ser: 0.73 mg/dL (ref 0.61–1.24)
GFR, Estimated: >60 mL/min (ref >60)
Glucose, Bld: 87 mg/dL (ref 70–99)
Potassium: 4.2 mmol/L (ref 3.5–5.1)
Sodium: 135 mmol/L (ref 135–145)
Total Bilirubin: 0.2 mg/dL — ABNORMAL LOW (ref 0.3–1.2)
Total Protein: 7.1 g/dL (ref 6.5–8.1)

## 2021-10-10 LAB — CBC WITH DIFFERENTIAL/PLATELET
Abs Immature Granulocytes: 0.23 10*3/uL — ABNORMAL HIGH (ref 0.00–0.07)
Basophils Absolute: 0.1 10*3/uL (ref 0.0–0.1)
Basophils Relative: 1 %
Eosinophils Absolute: 0.1 10*3/uL (ref 0.0–0.5)
Eosinophils Relative: 1 %
HCT: 43.6 % (ref 39.0–52.0)
Hemoglobin: 14.9 g/dL (ref 13.0–17.0)
Immature Granulocytes: 2 %
Lymphocytes Relative: 14 %
Lymphs Abs: 1.8 10*3/uL (ref 0.7–4.0)
MCH: 30 pg (ref 26.0–34.0)
MCHC: 34.2 g/dL (ref 30.0–36.0)
MCV: 87.9 fL (ref 80.0–100.0)
Monocytes Absolute: 0.9 10*3/uL (ref 0.1–1.0)
Monocytes Relative: 7 %
Neutro Abs: 10.2 10*3/uL — ABNORMAL HIGH (ref 1.7–7.7)
Neutrophils Relative %: 75 %
Platelets: 668 10*3/uL — ABNORMAL HIGH (ref 150–400)
RBC: 4.96 MIL/uL (ref 4.22–5.81)
RDW: 13.9 % (ref 11.5–15.5)
WBC: 13.4 10*3/uL — ABNORMAL HIGH (ref 4.0–10.5)
nRBC: 0 % (ref 0.0–0.2)

## 2021-10-10 LAB — URIC ACID: Uric Acid, Serum: 2.1 mg/dL — ABNORMAL LOW (ref 3.7–8.6)

## 2021-10-10 LAB — LACTATE DEHYDROGENASE: LDH: 154 U/L (ref 98–192)

## 2021-10-10 MED ORDER — HEPARIN SOD (PORK) LOCK FLUSH 100 UNIT/ML IV SOLN
500.0000 [IU] | Freq: Once | INTRAVENOUS | Status: AC
  Administered 2021-10-10: 500 [IU] via INTRAVENOUS
  Filled 2021-10-10: qty 5

## 2021-10-10 MED ORDER — SODIUM CHLORIDE 0.9% FLUSH
10.0000 mL | Freq: Once | INTRAVENOUS | Status: AC
  Administered 2021-10-10: 10 mL via INTRAVENOUS
  Filled 2021-10-10: qty 10

## 2021-10-10 NOTE — Progress Notes (Signed)
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   AUG 2021-Head and neck: The brain demonstrates symmetric appearing diffuse physiologic uptake without focal finding to suggest abnormality. In the left posterior and medial maxillary cavity, there is an area of mucoid deposition seen within SUV max of 28.8. This was seen in the previous study of November. The specific bony erosion, clear-cut soft tissue lesion is not detected. This probably represents a chronic sinus disease focus. The bilateral optic nerves and extraocular muscles to the largest nodes are seen on axial image 98. 1 now demonstrates a short axis measurement of 3.3 cm and the other more medially, a short axis measurement of 2.5 cm. Demonstrate symmetric increased uptake. The left, much greater than right, jugulodigastric and supraclavicular nodes have increased significantly in size and avidity since the previous study. Both of these lymph nodes demonstrate an SUV max of 3.9. No largest lymph node in the right subclavicular space is seen on axial image 104, demonstrating an SUV max of 3.3, and a short axis measurement of 1.4 cm.   On axial image 102, a right paratracheal lymph node is again identified with increased avidity, located immediately posterior to the right thyroid pole. This  area was noted previously, but now increased avidity is seen, with an SUV max of 8.6 in this area. Multiple smaller, and less avid lymph nodes scattered throughout the left greater than right neck are seen, as well as supraclavicular and infraclavicular areas.   Thorax: Significant axillary lymph nodes have increased significantly in size. These are also left greater than right, with the largest on the left seen on image #114 with a short axis measurement of 2.2 cm and an SUV max of 4.3. The largest on the right as seen on image 111, demonstrating a short axis of 1.4 cm and an SUV max of 4.0. Too numerous to count smaller and similarly avid lymph nodes in both axillae are also noted. These are all larger than previously seen and in general, more avid.  Numerous small AP window and lesser mediastinal lymph nodes are seen with minimal increased avidity, appearing similar in size and avidity to the previous study. No other significant mediastinal adenopathy. No hilar adenopathy detected. These likely also represent lymphoproliferative disease.  Hyperinflation of COPD, bibasilar dependent atelectasis, some paucity of lung tissue and biapical scarring are noted without other focal pulmonary mass or nodule, or acute pulmonary parenchymal disease. Physiologic uptake in the left heart is seen, as expected. Small hiatal hernia is again identified.   Abdomen pelvis: Physiologic uptake in the liver, gut and urinary tract is again noted. No focal area of asymmetric uptake in the structures is seen. The spleen also demonstrates generalized increased uptake and stable splenomegaly, up to 14.2 cm. On image 198, a preaortic lymph node is detected of increased size since the previous study, now measuring up to 1.2 cm short axis.  Several large  lymph nodes on image 203 of the mesentery are noted. The largest of these, on image 201 and ureters up to 1.8 cm short axis. Despite the fact that measurable avidity is not detected in  these are the surrounding lymph nodes, these are doubtless involved in the lymphoproliferative disease described, and appear larger. Multiple smaller lymph nodes scattered throughout the mesentery are also seen.  Grossly enlarged right greater than left common iliac chain lymph nodes are again seen, throughout the pelvis and into the inguinal areas bilaterally. The most avid on the right is seen on image #260 to, demonstrates a short axis of 2.5 cm, and an SUV max of 4.9. The largest on the left is identified image #272, demonstrates a short axis of 2.0 cm, and an SUV max of 4.7. These are similar to the previously noted values.  Right greater than left inguinal adenopathy is seen, with the largest lymph node seen on the right image 276, demonstrating a short axis of 1.5 cm and an SUV max of 3.9. This is larger in size and avidity than previously seen. There are bilateral enlarged lymph nodes associated with the external iliac vessels on image 277. These measure 2.0 cm on the right in short axis, with an SUV max of 3.9.  On the left, image 278, the external iliac lymph node is larger at 1.8 cm short axis, but with an SUV max of only 3.8. Lesser avid and smaller lymph nodes on both sides are also detected.   Musculoskeletal: Scattered, insignificant uptake is detected in the muscles especially in the bilateral joints, without focal finding to suggest lymphoproliferative disease.   IMPRESSIONS:  Significant increase in numbers, short axis size, and to a large extent, avidity -in the lymph nodes of the left greater than right neck, supraclavicular and infraclavicular areas, bilateral axillary lymph nodes and common iliac chain, external iliac chain and inguinal lymph nodes, consistent with worsening lymphoproliferative disease. The mediastinal lymph nodes seen are still present and unchanged in size or [significantly] in avidity.    [place]- Bx- SLL/CLL.July 2022-Discussed with Dr. Asquith-oncologist,  [place]-reviewed the pathology of the lymph node biopsy in 2021-positive for SLL; negative for cyclin D1/1114 translocation.    # July 20th, 2022- GAZYVA #1 Aletha Halim plan ading VENATOCLAX after 2 cycles/de-bulking]  # Lung cancer screening- on [facility]  # SURVIVORSHIP:   # GENETICS:   DIAGNOSIS:   STAGE:         ;  GOALS:  CURRENT/MOST RECENT THERAPY :       CLL (chronic lymphocytic leukemia) ([place])  10/29/2019 Initial Diagnosis   CLL (chronic lymphocytic leukemia) ([place])   06/15/2021 -  Chemotherapy   Patient is on Treatment Plan : LYMPHOMA CLL/SLL Venetoclax + Obinutuzumab q28d        INTERVAL HISTORY: Ambulating independently.  Accompanied by his wife.  A pleasant 17 year-old male patient with CLL/SLL -stage IV bulky adenopathy-currently s/p cycle #4, returns to clinic for hospital follow up.  Patient presented to ER on 10/02/2021 with complaints of chest pain.  He was found to have a large pericardial effusion and underwent urgent pericardiocentesis.  200 cc of fluid were removed.  Follow-up echo showed small pericardial effusion lateral to the left ventricle.  He was started on colchicine for anti-inflammatory effect x 1 month.  Cytology was negative.  He received antibiotics for possible pneumonia.  He was discharged home on 10/05/2021.  Today, he says that he is energy is improving as well as shortness of breath.  Overall he feels much better.  No chest pain or shortness of breath.  He is able to take a deep breath.  No fevers or chills.  He questions what caused his symptoms.  Review of Systems  Constitutional:  Positive for malaise/fatigue. Negative for chills, diaphoresis, fever and weight loss.  HENT:  Negative for nosebleeds and sore throat.   Eyes:  Negative for double vision.  Respiratory:  Negative for cough, hemoptysis, sputum production, shortness of breath and wheezing.   Cardiovascular:  Negative for chest pain, palpitations, orthopnea and leg swelling.  Gastrointestinal:   Negative for abdominal pain, blood in stool, constipation, diarrhea, heartburn, melena, nausea and vomiting.  Genitourinary:  Negative for dysuria, frequency and urgency.  Musculoskeletal:  Negative for back pain and joint pain.  Skin: Negative.  Negative for itching and rash.  Neurological:  Negative for dizziness, tingling, focal weakness, weakness and headaches.  Endo/Heme/Allergies:  Does not bruise/bleed easily.  Psychiatric/Behavioral:  Negative for depression. The patient is not nervous/anxious and does not have insomnia.     PAST MEDICAL HISTORY :  Past Medical History:  Diagnosis Date   Atherosclerosis of coronary artery 09/28/2016   Chest CT Nov 2017   Elevated C-reactive protein (CRP)    Elevated PSA    Fractured sternum 1999   Herpes    History of rib fracture 1999   multiple   Hyperlipidemia    Psoriasis    Rheumatoid factor positive April 2015   Rosacea    Thoracic aortic atherosclerosis ([place]) 09/28/2016   Chest CT Nov 2017   Tobacco abuse    Vitamin D deficiency disease     PAST SURGICAL HISTORY :   Past Surgical History:  Procedure Laterality Date   APPENDECTOMY  1974   PORTA CATH INSERTION N/A 06/06/2021   Procedure: PORTA CATH INSERTION;  Surgeon: Algernon Huxley, MD;  Location: [facility];  Service: Cardiovascular;  Laterality: N/A;   SHOULDER ARTHROSCOPY  10/23/2016   Procedure: ARTHROSCOPY SHOULDER WITH DISTAL CLAVICLE EXCISION, PARTIAL ACROMIONECTOMY, AND DEBRIDEMENT;  Surgeon: Earnestine Leys, MD;  Location: [facility];  Service: Orthopedics;;   TIBIA FRACTURE SURGERY Left 2004   titanium rod, ORIF    FAMILY HISTORY :   Family History  Problem Relation Age of Onset   Heart disease Father    Osteoarthritis Father    Alzheimer's disease Father    Cancer Neg Hx    Diabetes Neg Hx    Hypertension Neg Hx    Stroke Neg Hx    COPD Neg Hx     SOCIAL HISTORY:   Social History   Tobacco Use   Smoking status: Every Day    Packs/day: 0.50     Years: 45.00    Pack years: 22.50    Types: Cigarettes   Smokeless tobacco: Never   Tobacco comments:    11/6- currently smoking 5 cigarettes a day   Vaping Use   Vaping Use: Never used  Substance Use Topics   Alcohol use: Yes    Alcohol/week: 3.0 standard drinks    Types: 3 Glasses of wine per week    Comment: a couple glasses of wine a week   Drug use: No    ALLERGIES:  is allergic to gluten meal.  MEDICATIONS:  Current Outpatient Medications  Medication Sig Dispense Refill   acetaminophen (TYLENOL) 325 MG tablet Take by mouth every 6 (six) hours as needed.     acyclovir (ZOVIRAX) 400 MG tablet Take 1 tablet (400  mg total) by mouth 2 (two) times daily. 60 tablet 4   allopurinol (ZYLOPRIM) 300 MG tablet Take 1 tablet (300 mg total) by mouth 2 (two) times daily. 120 tablet 3   diphenhydrAMINE (BENADRYL) 25 MG tablet Take 25 mg by mouth every 6 (six) hours as needed.     lidocaine-prilocaine (EMLA) cream Apply 30 -45 mins prior to port access. 30 g 0   montelukast (SINGULAIR) 10 MG tablet Take 1 tablet (10 mg total) by mouth at bedtime. Start 2 days prior to infusion. Do not take on the day of infusion. 60 tablet 0   venetoclax (VENCLEXTA) 10 & 50 & 100 MG Starter Pack Take by mouth daily. Take 20 mg for 7 days, then 50 mg daily x 7d, then 100 mg daily x 7d, then 200 mg daily x 7d. Take with food & water. 42 tablet 0   No current facility-administered medications for this visit.    PHYSICAL EXAMINATION: ECOG PERFORMANCE STATUS: 0 - Asymptomatic  BP 105/77 (BP Location: Left Arm, Patient Position: Sitting)   Pulse 77   Temp (!) 96 F (35.6 C) (Tympanic)   Resp 17   Wt 162 lb (73.5 kg)   SpO2 98%   BMI 26.96 kg/m   Filed Weights   09/15/21 0842  Weight: 162 lb (73.5 kg)   Physical Exam Constitutional:      Appearance: He is not ill-appearing.  Eyes:     General: No scleral icterus.    Conjunctiva/sclera: Conjunctivae normal.  Cardiovascular:     Rate and Rhythm:  Normal rate and regular rhythm.  Abdominal:     General: There is no distension.     Palpations: Abdomen is soft.     Tenderness: There is no abdominal tenderness. There is no guarding.  Musculoskeletal:        General: No deformity.     Right lower leg: No edema.     Left lower leg: No edema.  Lymphadenopathy:     Cervical: No cervical adenopathy.  Skin:    General: Skin is warm and dry.  Neurological:     Mental Status: He is alert and oriented to person, place, and time. Mental status is at baseline.  Psychiatric:        Mood and Affect: Mood normal.        Behavior: Behavior normal.    LABORATORY DATA:  I have reviewed the data as listed    Component Value Date/Time   NA 136 09/15/2021 1402   K 3.9 09/15/2021 1402   CL 102 09/15/2021 1402   CO2 28 09/15/2021 1402   GLUCOSE 86 09/15/2021 1402   BUN 12 09/15/2021 1402   CREATININE 0.87 09/15/2021 1402   CREATININE 0.84 10/06/2019 0000   CALCIUM 9.3 09/15/2021 1402   PROT 6.7 09/15/2021 0800   ALBUMIN 3.9 09/15/2021 0800   AST 16 09/15/2021 0800   ALT 17 09/15/2021 0800   ALKPHOS 67 09/15/2021 0800   BILITOT 0.6 09/15/2021 0800   GFRNONAA >60 09/15/2021 1402   GFRNONAA 91 10/06/2019 0000   GFRAA >60 12/26/2019 0842   GFRAA 106 10/06/2019 0000    No results found for: SPEP, UPEP  Lab Results  Component Value Date   WBC 6.8 09/15/2021   NEUTROABS 5.1 09/15/2021   HGB 15.1 09/15/2021   HCT 44.7 09/15/2021   MCV 90.3 09/15/2021   PLT 247 09/15/2021      Chemistry      Component Value Date/Time  NA 136 09/15/2021 1402   K 3.9 09/15/2021 1402   CL 102 09/15/2021 1402   CO2 28 09/15/2021 1402   BUN 12 09/15/2021 1402   CREATININE 0.87 09/15/2021 1402   CREATININE 0.84 10/06/2019 0000      Component Value Date/Time   CALCIUM 9.3 09/15/2021 1402   ALKPHOS 67 09/15/2021 0800   AST 16 09/15/2021 0800   ALT 17 09/15/2021 0800   BILITOT 0.6 09/15/2021 0800       RADIOGRAPHIC STUDIES: I have  personally reviewed the radiological images as listed and agreed with the findings in the report. No results found.   ASSESSMENT & PLAN:   1.  CLL- s/p gazyva plus venetoclax with significant improvement in lymphadenopathy. Continue to hold venetoclax at this time. Unclear if pericarditis/effusion is related to CLL though unlikely given negative cytology and otherwise improvement in his lymphadenopathy.  2.  Pericardial effusion-cytology was negative for malignancy.  Etiology unclear.  No apparent recurrent symptoms.  He was discharged home with 1 month of colchicine for anti-inflammatory effect. 3. Evusheld- received evusheld 05/20/21. He is due for second dose in December. Will have patient see me for virtual visit to assess need, tolerance.  Reviewed with Dr. Rogue Bussing who will see him in one week with labs (cbc, cmp, ldh, mag) to re-evaluate symptoms and will consider restarting venetoclax at that time.   Beckey Rutter, DNP, AGNP-C [place] at [facility] [phone] (clinic)

## 2021-10-11 ENCOUNTER — Telehealth: Payer: Self-pay | Admitting: *Deleted

## 2021-10-11 ENCOUNTER — Other Ambulatory Visit (HOSPITAL_COMMUNITY): Payer: Self-pay

## 2021-10-11 NOTE — Telephone Encounter (Signed)
Pharmacy called stating that they need a  new prescription for maintenance dose of Venclexta since he has restarted it per office note

## 2021-10-16 ENCOUNTER — Encounter: Payer: Self-pay | Admitting: Emergency Medicine

## 2021-10-16 ENCOUNTER — Emergency Department: Payer: Medicare PPO

## 2021-10-16 ENCOUNTER — Inpatient Hospital Stay
Admission: EM | Admit: 2021-10-16 | Discharge: 2021-10-18 | DRG: 176 | Disposition: A | Discharge status: Home / Self Care | Payer: Medicare PPO | Attending: Internal Medicine | Admitting: Internal Medicine

## 2021-10-16 ENCOUNTER — Other Ambulatory Visit: Payer: Self-pay

## 2021-10-16 ENCOUNTER — Observation Stay: Payer: Medicare PPO

## 2021-10-16 DIAGNOSIS — I2699 Other pulmonary embolism without acute cor pulmonale: Secondary | ICD-10-CM | POA: Diagnosis present

## 2021-10-16 DIAGNOSIS — I2510 Atherosclerotic heart disease of native coronary artery without angina pectoris: Secondary | ICD-10-CM | POA: Diagnosis present

## 2021-10-16 DIAGNOSIS — J9811 Atelectasis: Secondary | ICD-10-CM | POA: Diagnosis present

## 2021-10-16 DIAGNOSIS — R0789 Other chest pain: Secondary | ICD-10-CM | POA: Diagnosis not present

## 2021-10-16 DIAGNOSIS — Z820 Family history of epilepsy and other diseases of the nervous system: Secondary | ICD-10-CM | POA: Diagnosis not present

## 2021-10-16 DIAGNOSIS — I3139 Other pericardial effusion (noninflammatory): Secondary | ICD-10-CM | POA: Diagnosis present

## 2021-10-16 DIAGNOSIS — J90 Pleural effusion, not elsewhere classified: Secondary | ICD-10-CM | POA: Diagnosis present

## 2021-10-16 DIAGNOSIS — F17210 Nicotine dependence, cigarettes, uncomplicated: Secondary | ICD-10-CM | POA: Diagnosis present

## 2021-10-16 DIAGNOSIS — I2584 Coronary atherosclerosis due to calcified coronary lesion: Secondary | ICD-10-CM

## 2021-10-16 DIAGNOSIS — Z720 Tobacco use: Secondary | ICD-10-CM | POA: Diagnosis not present

## 2021-10-16 DIAGNOSIS — E785 Hyperlipidemia, unspecified: Secondary | ICD-10-CM

## 2021-10-16 DIAGNOSIS — Z20822 Contact with and (suspected) exposure to covid-19: Secondary | ICD-10-CM | POA: Diagnosis present

## 2021-10-16 DIAGNOSIS — R52 Pain, unspecified: Secondary | ICD-10-CM | POA: Diagnosis not present

## 2021-10-16 DIAGNOSIS — I2609 Other pulmonary embolism with acute cor pulmonale: Secondary | ICD-10-CM

## 2021-10-16 DIAGNOSIS — R079 Chest pain, unspecified: Secondary | ICD-10-CM

## 2021-10-16 DIAGNOSIS — I700 Atherosclerosis of aorta: Secondary | ICD-10-CM | POA: Diagnosis present

## 2021-10-16 DIAGNOSIS — K900 Celiac disease: Secondary | ICD-10-CM | POA: Diagnosis present

## 2021-10-16 DIAGNOSIS — I4510 Unspecified right bundle-branch block: Secondary | ICD-10-CM | POA: Diagnosis present

## 2021-10-16 DIAGNOSIS — R6510 Systemic inflammatory response syndrome (SIRS) of non-infectious origin without acute organ dysfunction: Secondary | ICD-10-CM | POA: Diagnosis present

## 2021-10-16 DIAGNOSIS — Z8249 Family history of ischemic heart disease and other diseases of the circulatory system: Secondary | ICD-10-CM

## 2021-10-16 DIAGNOSIS — E782 Mixed hyperlipidemia: Secondary | ICD-10-CM | POA: Diagnosis not present

## 2021-10-16 DIAGNOSIS — E559 Vitamin D deficiency, unspecified: Secondary | ICD-10-CM | POA: Diagnosis present

## 2021-10-16 DIAGNOSIS — Z85118 Personal history of other malignant neoplasm of bronchus and lung: Secondary | ICD-10-CM | POA: Diagnosis not present

## 2021-10-16 DIAGNOSIS — R0600 Dyspnea, unspecified: Secondary | ICD-10-CM | POA: Diagnosis not present

## 2021-10-16 DIAGNOSIS — C9110 Chronic lymphocytic leukemia of B-cell type not having achieved remission: Secondary | ICD-10-CM | POA: Diagnosis present

## 2021-10-16 DIAGNOSIS — I309 Acute pericarditis, unspecified: Secondary | ICD-10-CM | POA: Diagnosis present

## 2021-10-16 DIAGNOSIS — Z79899 Other long term (current) drug therapy: Secondary | ICD-10-CM

## 2021-10-16 DIAGNOSIS — M79661 Pain in right lower leg: Secondary | ICD-10-CM | POA: Diagnosis not present

## 2021-10-16 DIAGNOSIS — R000 Tachycardia, unspecified: Secondary | ICD-10-CM | POA: Diagnosis not present

## 2021-10-16 DIAGNOSIS — M79662 Pain in left lower leg: Secondary | ICD-10-CM | POA: Diagnosis not present

## 2021-10-16 LAB — APTT: aPTT: 32 seconds (ref 24–36)

## 2021-10-16 LAB — TROPONIN I (HIGH SENSITIVITY)
Troponin I (High Sensitivity): 3 ng/L (ref <18)
Troponin I (High Sensitivity): 3 ng/L (ref <18)

## 2021-10-16 LAB — CBC
HCT: 44.5 % (ref 39.0–52.0)
Hemoglobin: 14.9 g/dL (ref 13.0–17.0)
MCH: 30 pg (ref 26.0–34.0)
MCHC: 33.5 g/dL (ref 30.0–36.0)
MCV: 89.7 fL (ref 80.0–100.0)
Platelets: 420 10*3/uL — ABNORMAL HIGH (ref 150–400)
RBC: 4.96 MIL/uL (ref 4.22–5.81)
RDW: 14.5 % (ref 11.5–15.5)
WBC: 26.4 10*3/uL — ABNORMAL HIGH (ref 4.0–10.5)
nRBC: 0 % (ref 0.0–0.2)

## 2021-10-16 LAB — MRSA NEXT GEN BY PCR, NASAL: MRSA by PCR Next Gen: NOT DETECTED

## 2021-10-16 LAB — BASIC METABOLIC PANEL
Anion gap: 8 (ref 5–15)
BUN: 11 mg/dL (ref 8–23)
CO2: 27 mmol/L (ref 22–32)
Calcium: 9.9 mg/dL (ref 8.9–10.3)
Chloride: 102 mmol/L (ref 98–111)
Creatinine, Ser: 0.64 mg/dL (ref 0.61–1.24)
GFR, Estimated: >60 mL/min (ref >60)
Glucose, Bld: 139 mg/dL — ABNORMAL HIGH (ref 70–99)
Potassium: 3.9 mmol/L (ref 3.5–5.1)
Sodium: 137 mmol/L (ref 135–145)

## 2021-10-16 LAB — LACTIC ACID, PLASMA
Lactic Acid, Venous: 2.5 mmol/L (ref 0.5–1.9)
Lactic Acid, Venous: 2 mmol/L (ref 0.5–1.9)

## 2021-10-16 LAB — PROTIME-INR
INR: 1 (ref 0.8–1.2)
Prothrombin Time: 13.6 seconds (ref 11.4–15.2)

## 2021-10-16 LAB — PROCALCITONIN: Procalcitonin: <0.1 ng/mL

## 2021-10-16 LAB — RESP PANEL BY RT-PCR (FLU A&B, COVID) ARPGX2
Influenza A by PCR: NEGATIVE
Influenza B by PCR: NEGATIVE
SARS Coronavirus 2 by RT PCR: NEGATIVE

## 2021-10-16 LAB — MAGNESIUM: Magnesium: 2 mg/dL (ref 1.7–2.4)

## 2021-10-16 MED ORDER — ONDANSETRON HCL 4 MG/2ML IJ SOLN: 4.0000 mg | Freq: Four times a day (QID) | INTRAMUSCULAR | Status: DC | PRN

## 2021-10-16 MED ORDER — SODIUM CHLORIDE 0.9 % IV SOLN
2.0000 g | Freq: Once | INTRAVENOUS | Status: AC
  Administered 2021-10-16: 2 g via INTRAVENOUS
  Filled 2021-10-16: qty 2

## 2021-10-16 MED ORDER — VANCOMYCIN HCL 1500 MG/300ML IV SOLN
1500.0000 mg | Freq: Once | INTRAVENOUS | Status: AC
  Administered 2021-10-16: 1500 mg via INTRAVENOUS
  Filled 2021-10-16: qty 300

## 2021-10-16 MED ORDER — HYDROMORPHONE HCL 1 MG/ML IJ SOLN
0.5000 mg | Freq: Once | INTRAMUSCULAR | Status: AC
  Administered 2021-10-16: 0.5 mg via INTRAVENOUS
  Filled 2021-10-16: qty 1

## 2021-10-16 MED ORDER — MORPHINE SULFATE (PF) 4 MG/ML IV SOLN
4.0000 mg | Freq: Once | INTRAVENOUS | Status: AC
  Administered 2021-10-16: 4 mg via INTRAVENOUS
  Filled 2021-10-16: qty 1

## 2021-10-16 MED ORDER — ONDANSETRON HCL 4 MG PO TABS: 4.0000 mg | ORAL_TABLET | Freq: Four times a day (QID) | ORAL | Status: DC | PRN

## 2021-10-16 MED ORDER — ACETAMINOPHEN 650 MG RE SUPP
650.0000 mg | Freq: Four times a day (QID) | RECTAL | Status: DC | PRN
  Filled 2021-10-16: qty 1

## 2021-10-16 MED ORDER — HEPARIN (PORCINE) 25000 UT/250ML-% IV SOLN
1850.0000 [IU]/h | INTRAVENOUS | Status: DC
Start: 2021-10-16 — End: 2021-10-18
  Administered 2021-10-16: 1150 [IU]/h via INTRAVENOUS
  Administered 2021-10-17: 1350 [IU]/h via INTRAVENOUS
  Administered 2021-10-18: 1850 [IU]/h via INTRAVENOUS
  Filled 2021-10-16 (×3): qty 250

## 2021-10-16 MED ORDER — IOHEXOL 350 MG/ML SOLN
75.0000 mL | Freq: Once | INTRAVENOUS | Status: AC | PRN
  Administered 2021-10-16: 75 mL via INTRAVENOUS

## 2021-10-16 MED ORDER — LACTATED RINGERS IV SOLN: INTRAVENOUS | Status: AC

## 2021-10-16 MED ORDER — SODIUM CHLORIDE 0.9 % IV BOLUS
1000.0000 mL | Freq: Once | INTRAVENOUS | Status: AC
  Administered 2021-10-16: 1000 mL via INTRAVENOUS

## 2021-10-16 MED ORDER — ACETAMINOPHEN 325 MG PO TABS: 650.0000 mg | ORAL_TABLET | Freq: Four times a day (QID) | ORAL | Status: DC | PRN

## 2021-10-16 MED ORDER — HEPARIN BOLUS VIA INFUSION
5000.0000 [IU] | INTRAVENOUS | Status: AC
  Administered 2021-10-16: 5000 [IU] via INTRAVENOUS
  Filled 2021-10-16: qty 5000

## 2021-10-16 MED ORDER — VANCOMYCIN HCL IN DEXTROSE 1-5 GM/200ML-% IV SOLN: 1000.0000 mg | Freq: Once | INTRAVENOUS | Status: DC

## 2021-10-16 NOTE — ED Notes (Signed)
Md at bedside.  Xray done and iv started.

## 2021-10-16 NOTE — Progress Notes (Signed)
ANTICOAGULATION CONSULT NOTE  Pharmacy Consult for heparin infusion Indication: pulmonary embolus  Allergies  Allergen Reactions   Gluten Meal     Gluten intolerance     Patient Measurements:   Heparin Dosing Weight: 70.1 kg  Vital Signs: Temp: 98.6 F (37 C) (11/20 2022) Temp Source: Oral (11/20 2022) BP: 114/79 (11/20 2130) Pulse Rate: 101 (11/20 2215)  Labs: Recent Labs    10/16/21 2022  HGB 14.9  HCT 44.5  PLT 420*  CREATININE 0.64  TROPONINIHS 3    Estimated Creatinine Clearance: 79.8 mL/min (by C-G formula based on SCr of 0.64 mg/dL).   Medical History: Past Medical History:  Diagnosis Date   Atherosclerosis of coronary artery 09/28/2016   Chest CT Nov 2017   Atrial tachycardia ([facility])    CLL (chronic lymphocytic leukemia) ([facility])    Elevated C-reactive protein (CRP)    Elevated PSA    Fractured sternum 1999   Herpes    History of rib fracture 1999   multiple   Hyperlipidemia    Psoriasis    Rheumatoid factor positive 02/2014   Rosacea    Thoracic aortic atherosclerosis ([place]) 09/28/2016   Chest CT Nov 2017   Tobacco abuse    Vitamin D deficiency disease     Assessment: Pt is 70 yo male presenting with sudden onset of SOB and midsternum CP found with acute pulmonary embolism.  Goal of Therapy:  Heparin level 0.3-0.7 units/ml Monitor platelets by anticoagulation protocol: Yes   Plan:  Bolus 5000 units x 1 Start heparin infusion at 1150 units/hr Check HL in 6 hr after start of infusion CBC daily while on heparin  Renda Rolls, PharmD, [facility] 10/16/2021 11:07 PM

## 2021-10-16 NOTE — ED Triage Notes (Signed)
First RN Note: Pt to ED via [facility] from home with c/o substernal CP that increases with deep inspiration. Per EMS pt had cardiocentesis and had 225m of fluid pull from his heart. Per EMS VSS en route.   RBB on 12L at baseline  324 ASA en route

## 2021-10-16 NOTE — H&P (Addendum)
History and Physical   Andres Mullins QMG:[id number] DOB: [birth year] DOA: 10/16/2021  PCP: [name], PA-C  Outpatient Specialists: Dr. Irish Lack, cardiovascular Patient coming from: Home via EMS  I have personally briefly reviewed patient's old medical records in [place].  Chief Concern: Chest pain  HPI: Andres Mullins is a 70 y.o. male with medical history significant for gout, pericardial effusion status post pericardiocentesis, CLL, hyperlipidemia, history of atrial tachycardia, tobacco abuse, aortic and coronary calcium deposit on CT, who presents to the emergency department for chief concerns of chest pain.  He states that he was watching a YouTube video, he suddenly developed chest pain and shortness of breath. He states if feels a brick sitting on his chest.  He states the chest pain is worse with inspiration.  He describes the pain initially as 7 out of 10 and after pain medication is now a 6 out of 10.  He states the chest pain is similar to in presentation to about 2 weeks ago when he presented to the emergency department and was found to have pericardial effusion.  However, he states the onset was more sudden this time.  He denies recent fever, abdominal pain, dysuria, hematuria, diarrhea, new swelling in his legs.  He denies recent international or long flight, and no long road trips.  Patient states that the morphine helped minimize his pain longer though it only brought his pain down from a 7 to a 6.  He states the Dilaudid only lasted about 10 to 15 minutes and was not sufficient for pain control.  He states that about a week ago at his outpatient clinic he received 1 dose of fentanyl for pain control and this helped him much better.  He endorses a moist cough that started for the last two days. He states the cough is not really productive.   Social history: He lives at home with his spouse. He currently smokes 5-6 cigarettes per day.  Formally he smoked 1 pack/day.   He infrequently drinks etoh. He denies recreational drug use. He is retired and formerly worked as a Equities trader.  Vaccination history: He is vaccinated for covid 19 and has gottn 4 doses Glass blower/designer combination)  ROS: Constitutional: no weight change, no fever ENT/Mouth: no sore throat, no rhinorrhea Eyes: no eye pain, no vision changes Cardiovascular: + chest pain, + dyspnea,  no edema, no palpitations Respiratory: + cough, no sputum, no wheezing Gastrointestinal: no nausea, no vomiting, no diarrhea, no constipation Genitourinary: no urinary incontinence, no dysuria, no hematuria Musculoskeletal: no arthralgias, no myalgias Skin: no skin lesions, no pruritus, Neuro: + weakness, no loss of consciousness, no syncope Psych: no anxiety, no depression, + decrease appetite Heme/Lymph: no bruising, no bleeding  ED Course: Discussed with emergency medicine provider, patient requiring hospitalization for chief concerns of new acute right lower lobe pulmonary embolism.  Vitals in the emergency department was remarkable for temperature of 98.6, respiration rate of 35, heart rate of 110, that has improved to 97, initial blood pressure 110/74, SPO2 97% on room air.  Labs in the emergency department was remarkable for serum sodium 137, potassium 3.9, chloride 102, bicarb 27, BUN 11, serum creatinine of 0.64, nonfasting blood glucose 139, WBC 26.4, hemoglobin 14.9, platelets 420.  GFR greater than 60.  COVID/influenza A/influenza B PCR were negative.  Blood cultures collected were pending.  Patient was started on heparin GTT per EDP.  EDP also ordered cefepime, vancomycin, lactated ringer 150 mL IVF, morphine 4  mg IV.  Additionally Dilaudid 0.5 mg IV was ordered.  Assessment/Plan  Principal Problem:   Pulmonary embolism (HCC) Active Problems:   Hyperlipidemia   Vitamin D deficiency disease   Tobacco abuse   Atherosclerosis of coronary artery   Thoracic aortic atherosclerosis  (HCC)   Coronary artery calcification   CLL (chronic lymphocytic leukemia) (HCC)   Pericardial effusion   # Pulmonary embolism-present admission - Patient will need etiology work-up, however I suspect this is secondary to CLL - On CTA chest read: While there is CT evidence of right heart strain, appears unchanged from prior CT examination of 10/02/2021 and as such right heart strain may not be attributed to acute pulmonary embolism, in fact, may reflect a chronic underlying abnormality - Complete echo ordered - EDP states he called cardiology for requests of a stat echo, per EDP, cardiology states not indicated at this time - Lower extremity ultrasound to assess for DVT ordered - Vascular has been consulted and will see the patient in the a.m. - Supportive measures: Morphine 4 mg every 4 hours as needed for moderate pain, 3 doses ordered; fentanyl 50 mcg every 4 hours as needed for severe pain, 3 doses ordered - Admit to progressive cardiac, with telemetry  # Patient met SIRS criteria - Elevated heart rate, respiration rate, leukocytosis and lactic acid - I have low clinical suspicion for infectious etiology at this time given that procalcitonin is negative, patient did not have any symptoms of infectious etiology, MRSA PCR is negative - Ordered UA - Leukocytosis presumed secondary to CLL and/or reactive to pulmonary embolism at this time - Blood cultures x2 have been ordered and are in process - A.m. team to follow-up on blood cultures  - CBC in the a.m.  # Left lung pleural effusion-was read as decreased in size on CTA imaging  # Persistent total collapse of left lower lobe-incentive spirometry, flutter valve  # CLL-outpatient follow-up with hematologist/oncologist  # A.m. team to complete med reconciliation  # Coronary artery disease  # Tobacco abuse/dependence  - Greater than 3 minutes was spent to educate patient on methods of tobacco cessation - Patient endorses readiness to  stop tobacco use.  Tobacco cessation counseling:  Weeks one and two, smoke 5 cigarettes per day. Week three and four, smoke 3 cigarettes per day, etc Clean all indoor clothing, sheets, blankets, and freshen textile furniture to rid the smell of cigarettes Only smoke outside and wear outer covering Leave cigarettes and lighters outside in separate places During in-between cigarettes, if you feel the urge to smoke, use the following: stress squeezing devices/phone a trusted friend to talk you through the urge/walk in a safe environment If missing the feeling of holding a cigarette, cut a sipping straw to the length of a cigarette and hold it between your fingers Call [facility] if in need of nicotine patches to help with cessation  Chart reviewed.   Hospitalization from 10/03/2021 to 10/05/2021: Patient was admitted for large pericardial effusion status post pericardiocentesis on 10/03/2021.  200 ml of fluid was removed.  Follow-up echo on 10/05/2021 showed small pericardial effusion lateral to the left ventricle.  Patient was started on colchicine for empiric anti-inflammatory treatment.  Patient was also treated for acute hypoxic respiratory failure due to pneumonia.  Patient was initially started on IV vancomycin and his antibiotic was transitioned to Rocephin and azithromycin.  DVT prophylaxis: Heparin GTT Code Status: Full code Diet: N.p.o., pending vascular evaluation Family Communication: Updated spouse at bedside  Disposition Plan: Pending clinical course Consults called: Vascular Admission status: Progressive cardiac, telemetry  Past Medical History:  Diagnosis Date   Atherosclerosis of coronary artery 09/28/2016   Chest CT Nov 2017   Atrial tachycardia (HCC)    CLL (chronic lymphocytic leukemia) (HCC)    Elevated C-reactive protein (CRP)    Elevated PSA    Fractured sternum 1999   Herpes    History of rib fracture 1999   multiple   Hyperlipidemia    Psoriasis    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   Rosacea    Thoracic aortic atherosclerosis ([place]) 09/28/2016   Chest CT Nov 2017   Tobacco abuse    Vitamin D deficiency disease    Past Surgical History:  Procedure Laterality Date   APPENDECTOMY  1974   PERICARDIOCENTESIS N/A 10/03/2021   Procedure: PERICARDIOCENTESIS;  Surgeon: Jettie Booze, MD;  Location: [facility];  Service: Cardiovascular;  Laterality: N/A;   PORTA CATH INSERTION N/A 06/06/2021   Procedure: PORTA CATH INSERTION;  Surgeon: Algernon Huxley, MD;  Location: [facility];  Service: Cardiovascular;  Laterality: N/A;   SHOULDER ARTHROSCOPY  10/23/2016   Procedure: ARTHROSCOPY SHOULDER WITH DISTAL CLAVICLE EXCISION, PARTIAL ACROMIONECTOMY, AND DEBRIDEMENT;  Surgeon: Earnestine Leys, MD;  Location: [facility];  Service: Orthopedics;;   TIBIA FRACTURE SURGERY Left 2004   titanium rod, ORIF   Social History:  reports that he has been smoking cigarettes. He has a 22.50 pack-year smoking history. He has never used smokeless tobacco. He reports current alcohol use of about 3.0 standard drinks per week. He reports that he does not use drugs.  Allergies  Allergen Reactions   Gluten Meal     Gluten intolerance    Family History  Problem Relation Age of Onset   Heart disease Father    Osteoarthritis Father    Alzheimer's disease Father    Cancer Neg Hx    Diabetes Neg Hx    Hypertension Neg Hx    Stroke Neg Hx    COPD Neg Hx    Family history: Family history reviewed and not pertinent  Prior to Admission medications   Medication Sig Start Date End Date Taking? Authorizing Provider  acetaminophen (TYLENOL) 325 MG tablet Take by mouth every 6 (six) hours as needed.    [provider]  acyclovir (ZOVIRAX) 400 MG tablet Take 1 tablet (400 mg total) by mouth 2 (two) times daily. 05/22/21   Cammie Sickle, MD  allopurinol (ZYLOPRIM) 300 MG tablet Take 1 tablet (300 mg total) by mouth 2 (two) times daily. 07/21/21    Cammie Sickle, MD  Colchicine 0.6 MG CAPS Take 1 capsule (0.6 mg) by mouth daily. 10/05/21   Patrecia Pour, MD  diphenhydrAMINE (BENADRYL) 25 MG tablet Take 25 mg by mouth every 6 (six) hours as needed.    [provider]  lidocaine-prilocaine (EMLA) cream Apply 30 -45 mins prior to port access. 05/22/21   Cammie Sickle, MD  montelukast (SINGULAIR) 10 MG tablet Take 1 tablet (10 mg total) by mouth at bedtime. Start 2 days prior to infusion. Do not take on the day of infusion. 05/22/21   Cammie Sickle, MD  nystatin (MYCOSTATIN) 100000 UNIT/ML suspension Take 5 mLs (500,000 Units total) by mouth 4 (four) times daily. 09/29/21   Jacquelin Hawking, NP  venetoclax (VENCLEXTA) 10 & 50 & 100 MG Starter Pack Take by mouth daily. Take 20 mg for 7 days, then 50  mg daily x 7d, then 100 mg daily x 7d, then 200 mg daily x 7d. Take with food & water. Patient not taking: Reported on 10/10/2021 08/03/21   Cammie Sickle, MD   Physical Exam: Vitals:   10/16/21 2130 10/16/21 2145 10/16/21 2215 10/16/21 2300  BP: 114/79   107/77  Pulse: (!) 107 (!) 103 (!) 101 99  Resp: 19 (!) 22 (!) 28 20  Temp:      TempSrc:      SpO2: 100% 97% 99% 97%   Constitutional: appears age-appropriate, NAD, calm, comfortable Eyes: PERRL, lids and conjunctivae normal ENMT: Mucous membranes are dry. Posterior pharynx clear of any exudate or lesions. Age-appropriate dentition. Hearing appropriate Neck: normal, supple, no masses, no thyromegaly Respiratory: clear to auscultation bilaterally, no wheezing, no crackles. Normal respiratory effort. No accessory muscle use.  Cardiovascular: Regular rate and rhythm, no murmurs / rubs / gallops. No extremity edema. 2+ pedal pulses. No carotid bruits.  Abdomen: no tenderness, no masses palpated, no hepatosplenomegaly. Bowel sounds positive.  Musculoskeletal: no clubbing / cyanosis. No joint deformity upper and lower extremities. Good ROM, no contractures, no  atrophy. Normal muscle tone.  Skin: no rashes, lesions, ulcers. No induration Neurologic: Sensation intact. Strength 5/5 in all 4.  Psychiatric: Normal judgment and insight. Alert and oriented x 3. Normal mood.   EKG: independently reviewed, showing sinus tachycardia with rate of 117, Right bundle branch block, QTc 438. Right bundle branch block was present on EKG on 09/07/2021  Chest x-ray on Admission: I personally reviewed and I agree with radiologist reading as below.  CT Angio Chest PE W/Cm &/Or Wo Cm  Result Date: 10/16/2021 CLINICAL DATA:  Tachycardia, dyspnea, midsternal chest pain EXAM: CT ANGIOGRAPHY CHEST WITH CONTRAST TECHNIQUE: Multidetector CT imaging of the chest was performed using the standard protocol during bolus administration of intravenous contrast. Multiplanar CT image reconstructions and MIPs were obtained to evaluate the vascular anatomy. CONTRAST:  41m OMNIPAQUE IOHEXOL 350 MG/ML SOLN COMPARISON:  10/02/2021 FINDINGS: Cardiovascular: There is adequate opacification of the pulmonary arterial tree. And intraluminal filling defect is now identified within the posterior basal segmental pulmonary artery of the right lower lobe in keeping with an acute pulmonary embolus. The overall embolic burden is small. The central pulmonary arteries are of normal caliber. There is, however, CT evidence of right heart strain with inversion of the normal RV/LV ratio. The RV/LV ratio is 1.1. No significant coronary artery calcification. Global cardiac size is within normal limits. Moderate pericardial effusion is present, slightly decreased in size since prior examination. Thoracic aorta is unremarkable. Mediastinum/Nodes: Pathologic bilateral axillary adenopathy is stable. Thyroid unremarkable. Esophagus is unremarkable. Lungs/Pleura: Right pleural effusion has resolved. Left pleural effusion has decreased in size. Persistent subtotal collapse of the left lower lobe. No superimposed focal  pulmonary infiltrate or nodule. Central airways are widely patent. Upper Abdomen: No acute abnormality. Musculoskeletal: No chest wall abnormality. No acute or significant osseous findings. Review of the MIP images confirms the above findings. IMPRESSION: Acute pulmonary embolism. The embolic burden is small. While there is CT evidence of right heart strain (RV/LV ratio 1.1) this appears unchanged from prior CT examination of 10/02/2021 and, as such, right heart strain may not be attributable to the acute pulmonary embolism but may, in fact, reflect a chronic underlying abnormality. Mild interval decrease in size of pericardial effusion. No CT evidence of cardiac tamponade. Interval resolution of right pleural effusion and decrease of left pleural effusion. Persistent subtotal left lower lobe collapse.  Stable pathologic bilateral axillary lymphadenopathy, compatible with an underlying lymphoproliferative process such as chronic lymphocytic leukemia. These results were called by telephone at the time of interpretation on 10/16/2021 at 10:43 pm to provider [facility] , who verbally acknowledged these results. Electronically Signed   By: Fidela Salisbury M.D.   On: 10/16/2021 22:46   US Venous Img Lower Bilateral (DVT)  Result Date: 10/17/2021 CLINICAL DATA:  Lower extremity pain and known pulmonary emboli EXAM: BILATERAL LOWER EXTREMITY VENOUS DOPPLER ULTRASOUND TECHNIQUE: Gray-scale sonography with graded compression, as well as color Doppler and duplex ultrasound were performed to evaluate the lower extremity deep venous systems from the level of the common femoral vein and including the common femoral, femoral, profunda femoral, popliteal and calf veins including the posterior tibial, peroneal and gastrocnemius veins when visible. The superficial great saphenous vein was also interrogated. Spectral Doppler was utilized to evaluate flow at rest and with distal augmentation maneuvers in the common femoral,  femoral and popliteal veins. COMPARISON:  None. FINDINGS: RIGHT LOWER EXTREMITY Common Femoral Vein: No evidence of thrombus. Normal compressibility, respiratory phasicity and response to augmentation. Saphenofemoral Junction: No evidence of thrombus. Normal compressibility and flow on color Doppler imaging. Profunda Femoral Vein: No evidence of thrombus. Normal compressibility and flow on color Doppler imaging. Femoral Vein: No evidence of thrombus. Normal compressibility, respiratory phasicity and response to augmentation. Popliteal Vein: No evidence of thrombus. Normal compressibility, respiratory phasicity and response to augmentation. Calf Veins: No evidence of thrombus. Normal compressibility and flow on color Doppler imaging. Superficial Great Saphenous Vein: No evidence of thrombus. Normal compressibility. Venous Reflux:  None. Other Findings:  None. LEFT LOWER EXTREMITY Common Femoral Vein: No evidence of thrombus. Normal compressibility, respiratory phasicity and response to augmentation. Saphenofemoral Junction: No evidence of thrombus. Normal compressibility and flow on color Doppler imaging. Profunda Femoral Vein: No evidence of thrombus. Normal compressibility and flow on color Doppler imaging. Femoral Vein: No evidence of thrombus. Normal compressibility, respiratory phasicity and response to augmentation. Popliteal Vein: No evidence of thrombus. Normal compressibility, respiratory phasicity and response to augmentation. Calf Veins: No evidence of thrombus. Normal compressibility and flow on color Doppler imaging. Superficial Great Saphenous Vein: No evidence of thrombus. Normal compressibility. Venous Reflux:  None. Other Findings:  None. IMPRESSION: No evidence of deep venous thrombosis in either lower extremity. Electronically Signed   By: Inez Catalina M.D.   On: 10/17/2021 00:31   DG Chest Port 1 View  Result Date: 10/16/2021 CLINICAL DATA:  Substernal chest pain. EXAM: PORTABLE CHEST 1 VIEW  COMPARISON:  October 02, 2021 FINDINGS: Stable Port-A-Cath. The heart, hila, mediastinum, lungs, and pleura are otherwise unremarkable. IMPRESSION: No active disease. Electronically Signed   By: Dorise Bullion III M.D.   On: 10/16/2021 21:06    Labs on Admission: I have personally reviewed following labs  CBC: Recent Labs  Lab 10/10/21 1400 10/16/21 2022  WBC 13.4* 26.4*  NEUTROABS 10.2*  --   HGB 14.9 14.9  HCT 43.6 44.5  MCV 87.9 89.7  PLT 668* 917*   Basic Metabolic Panel: Recent Labs  Lab 10/10/21 1400 10/16/21 2022  NA 135 137  K 4.2 3.9  CL 99 102  CO2 27 27  GLUCOSE 87 139*  BUN 11 11  CREATININE 0.73 0.64  CALCIUM 9.6 9.9  MG 2.3 2.0   GFR: Estimated Creatinine Clearance: 79.8 mL/min (by C-G formula based on SCr of 0.64 mg/dL).  Liver Function Tests: Recent Labs  Lab 10/10/21 1400  AST 18  ALT 23  ALKPHOS 73  BILITOT 0.2*  PROT 7.1  ALBUMIN 3.6    Coagulation Profile: Recent Labs  Lab 10/16/21 2022  INR 1.0   Urine analysis:    Component Value Date/Time   COLORURINE STRAW (A) 10/02/2021 2121   APPEARANCEUR CLEAR (A) 10/02/2021 2121   APPEARANCEUR Clear 11/12/2018 0830   LABSPEC 1.002 (L) 10/02/2021 2121   PHURINE 6.0 10/02/2021 2121   GLUCOSEU NEGATIVE 10/02/2021 2121   HGBUR SMALL (A) 10/02/2021 2121   BILIRUBINUR NEGATIVE 10/02/2021 2121   BILIRUBINUR Negative 11/12/2018 0830   KETONESUR NEGATIVE 10/02/2021 2121   PROTEINUR NEGATIVE 10/02/2021 2121   NITRITE NEGATIVE 10/02/2021 2121   LEUKOCYTESUR NEGATIVE 10/02/2021 2121   Dr. Tobie Poet [facility]  If 7PM-7AM, please contact overnight-coverage provider If 7AM-7PM, please contact day coverage provider [email or web address]  10/17/2021, 12:54 AM

## 2021-10-16 NOTE — ED Notes (Signed)
Lab called about add ons. ?

## 2021-10-16 NOTE — Consult Note (Signed)
PHARMACY -  BRIEF ANTIBIOTIC NOTE   Pharmacy has received consult(s) for vancomycin and cefepime from an ED provider.  The patient's profile has been reviewed for ht/wt/allergies/indication/available labs.    One time order(s) placed for cefepime 2 g and vancomycin 1500 mg IV   Further antibiotics/pharmacy consults should be ordered by admitting physician if indicated.                       Thank you, Darnelle Bos, PharmD  10/16/2021  9:04 PM

## 2021-10-16 NOTE — Consult Note (Signed)
CODE SEPSIS - PHARMACY COMMUNICATION  **Broad Spectrum Antibiotics should be administered within 1 hour of Sepsis diagnosis**  Time Code Sepsis Called/Page Received: 2102  Antibiotics Ordered: 2102  Time of 1st antibiotic administration: 2133  Additional action taken by pharmacy: N/A  If necessary, Name of Provider/Nurse Contacted: N/A    Darnelle Bos ,PharmD Clinical Pharmacist  10/16/2021  9:03 PM

## 2021-10-16 NOTE — ED Provider Notes (Signed)
[facility] Emergency Department Provider Note   ____________________________________________   Event Date/Time   First MD Initiated Contact with Patient 10/16/21 2036     (approximate)  I have reviewed the triage vital signs and the nursing notes.   HISTORY  Chief Complaint Chest Pain    HPI Andres Mullins is a 70 y.o. male with past medical history of CLL, hyperlipidemia, atrial tachycardia, and pericardial effusion who presents to the ED complaining of chest pain.  Patient reports that he had acute onset of pressure in the center of his chest approximately 1 hour prior to arrival.  He reports feeling like there is a "ton of bricks" on his chest with associated difficulty taking a full deep breath.  He denies any associated fevers or cough, had been feeling well until the sudden onset of the symptoms.  He describes it as similar to what he experienced a couple of weeks ago, when he required admission for pneumonia and pericardial effusion that required pericardiocentesis.  Etiology of his pericardial effusion was unclear but patient was feeling better at the time of discharge with follow-up echocardiogram showing only small residual effusion.        Past Medical History:  Diagnosis Date   Atherosclerosis of coronary artery 09/28/2016   Chest CT Nov 2017   Atrial tachycardia (HCC)    CLL (chronic lymphocytic leukemia) (HCC)    Elevated C-reactive protein (CRP)    Elevated PSA    Fractured sternum 1999   Herpes    History of rib fracture 1999   multiple   Hyperlipidemia    Psoriasis    Rheumatoid factor positive 02/2014   Rosacea    Thoracic aortic atherosclerosis ([place]) 09/28/2016   Chest CT Nov 2017   Tobacco abuse    Vitamin D deficiency disease     Patient Active Problem List   Diagnosis Date Noted   Pulmonary embolism ([place]) 10/16/2021   Sepsis due to pneumonia ([place]) 10/03/2021   Pericardial effusion 10/03/2021   Oropharyngeal  candidiasis 10/03/2021   Hyponatremia 10/03/2021   Acute hypoxemic respiratory failure ([place]) 10/03/2021   Cardiac tamponade 10/03/2021   CLL (chronic lymphocytic leukemia) ([place]) 10/29/2019   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   Welcome to Medicare preventive visit 11/15/2018   Renal lesion 11/12/2018   Prostate enlargement 11/12/2018   Acquired trigger finger 11/12/2018   Trigger middle finger of right hand 01/02/2018   Positive anti-CCP test 12/12/2017   Rheumatoid factor positive 12/12/2017   Trigger finger of both hands 12/12/2017   Bilateral hand pain 05/18/2017   Preventative health care 05/18/2017   Screen for colon cancer 05/18/2017   Coronary artery calcification 01/18/2017   Supraspinatus tendinitis, left 10/22/2016   Impingement syndrome of shoulder region 10/06/2016   Celiac disease 10/05/2016   Atherosclerosis of coronary artery 09/28/2016   Thoracic aortic atherosclerosis ([place]) 09/28/2016   Personal history of tobacco use, presenting hazards to health 09/26/2016   Lipoma of head 10/19/2015   Tendonitis, Achilles, left 10/14/2015   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   Herpes    Rosacea    Psoriasis    Hyperlipidemia    Vitamin D deficiency disease    Tobacco abuse    Elevated PSA    Elevated C-reactive protein (CRP)     Past Surgical History:  Procedure Laterality Date   APPENDECTOMY  1974   PERICARDIOCENTESIS N/A 10/03/2021   Procedure: PERICARDIOCENTESIS;  Surgeon: Jettie Booze, MD;  Location: [place] [facility];  Service: Cardiovascular;  Laterality: N/A;   PORTA CATH INSERTION N/A 06/06/2021   Procedure: PORTA CATH INSERTION;  Surgeon: Algernon Huxley, MD;  Location: [facility];  Service: Cardiovascular;  Laterality: N/A;   SHOULDER ARTHROSCOPY  10/23/2016   Procedure: ARTHROSCOPY SHOULDER WITH DISTAL CLAVICLE EXCISION, PARTIAL ACROMIONECTOMY, AND DEBRIDEMENT;  Surgeon: Earnestine Leys, MD;  Location: [facility] ORS;  Service:  Orthopedics;;   TIBIA FRACTURE SURGERY Left 2004   titanium rod, ORIF    Prior to Admission medications   Medication Sig Start Date End Date Taking? Authorizing Provider  acetaminophen (TYLENOL) 325 MG tablet Take by mouth every 6 (six) hours as needed.    [provider]  acyclovir (ZOVIRAX) 400 MG tablet Take 1 tablet (400 mg total) by mouth 2 (two) times daily. 05/22/21   Cammie Sickle, MD  allopurinol (ZYLOPRIM) 300 MG tablet Take 1 tablet (300 mg total) by mouth 2 (two) times daily. 07/21/21   Cammie Sickle, MD  Colchicine 0.6 MG CAPS Take 1 capsule (0.6 mg) by mouth daily. 10/05/21   Patrecia Pour, MD  diphenhydrAMINE (BENADRYL) 25 MG tablet Take 25 mg by mouth every 6 (six) hours as needed.    [provider]  lidocaine-prilocaine (EMLA) cream Apply 30 -45 mins prior to port access. 05/22/21   Cammie Sickle, MD  montelukast (SINGULAIR) 10 MG tablet Take 1 tablet (10 mg total) by mouth at bedtime. Start 2 days prior to infusion. Do not take on the day of infusion. 05/22/21   Cammie Sickle, MD  nystatin (MYCOSTATIN) 100000 UNIT/ML suspension Take 5 mLs (500,000 Units total) by mouth 4 (four) times daily. 09/29/21   Jacquelin Hawking, NP  venetoclax (VENCLEXTA) 10 & 50 & 100 MG Starter Pack Take by mouth daily. Take 20 mg for 7 days, then 50 mg daily x 7d, then 100 mg daily x 7d, then 200 mg daily x 7d. Take with food & water. Patient not taking: Reported on 10/10/2021 08/03/21   Cammie Sickle, MD    Allergies Gluten meal  Family History  Problem Relation Age of Onset   Heart disease Father    Osteoarthritis Father    Alzheimer's disease Father    Cancer Neg Hx    Diabetes Neg Hx    Hypertension Neg Hx    Stroke Neg Hx    COPD Neg Hx     Social History Social History   Tobacco Use   Smoking status: Every Day    Packs/day: 0.50    Years: 45.00    Pack years: 22.50    Types: Cigarettes   Smokeless tobacco: Never   Tobacco  comments:    11/6- currently smoking 5 cigarettes a day   Vaping Use   Vaping Use: Never used  Substance Use Topics   Alcohol use: Yes    Alcohol/week: 3.0 standard drinks    Types: 3 Glasses of wine per week    Comment: a couple glasses of wine a week   Drug use: No    Review of Systems  Constitutional: No fever/chills Eyes: No visual changes. ENT: No sore throat. Cardiovascular: Positive for chest pain. Respiratory: Positive for shortness of breath. Gastrointestinal: No abdominal pain.  No nausea, no vomiting.  No diarrhea.  No constipation. Genitourinary: Negative for dysuria. Musculoskeletal: Negative for back pain. Skin: Negative for rash. Neurological: Negative for headaches, focal weakness or numbness.  ____________________________________________   PHYSICAL EXAM:  VITAL SIGNS: ED Triage  Vitals [10/16/21 2022]  Enc Vitals Group     BP 114/83     Pulse Rate (!) 118     Resp 20     Temp 98.6 F (37 C)     Temp Source Oral     SpO2 95 %     Weight      Height      Head Circumference      Peak Flow      Pain Score      Pain Loc      Pain Edu?      Excl. in [place]?     Constitutional: Alert and oriented. Eyes: Conjunctivae are normal. Head: Atraumatic. Nose: No congestion/rhinnorhea. Mouth/Throat: Mucous membranes are moist. Neck: Normal ROM Cardiovascular: Tachycardic, regular rhythm. Grossly normal heart sounds.  2+ radial pulses bilaterally. Respiratory: Tachypneic with increased respiratory effort.  No retractions. Lungs CTAB. Gastrointestinal: Soft and nontender. No distention. Genitourinary: deferred Musculoskeletal: No lower extremity tenderness nor edema. Neurologic:  Normal speech and language. No gross focal neurologic deficits are appreciated. Skin:  Skin is warm, dry and intact. No rash noted. Psychiatric: Mood and affect are normal. Speech and behavior are normal.  ____________________________________________   LABS (all labs ordered are  listed, but only abnormal results are displayed)  Labs Reviewed  BASIC METABOLIC PANEL - Abnormal; Notable for the following components:      Result Value   Glucose, Bld 139 (*)    All other components within normal limits  CBC - Abnormal; Notable for the following components:   WBC 26.4 (*)    Platelets 420 (*)    All other components within normal limits  RESP PANEL BY RT-PCR (FLU A&B, COVID) ARPGX2  MRSA NEXT GEN BY PCR, NASAL  CULTURE, BLOOD (ROUTINE X 2)  CULTURE, BLOOD (ROUTINE X 2)  MAGNESIUM  PROCALCITONIN  APTT  PROTIME-INR  LACTIC ACID, PLASMA  LACTIC ACID, PLASMA  BASIC METABOLIC PANEL  CBC  TROPONIN I (HIGH SENSITIVITY)  TROPONIN I (HIGH SENSITIVITY)   ____________________________________________  EKG  ED ECG REPORT I, Blake Divine, the attending physician, personally viewed and interpreted this ECG.   Date: 10/16/2021  EKG Time: 20:36  Rate: 130  Rhythm: sinus tachycardia  Axis: RAD  Intervals:right bundle branch block  ST&T Change: Lateral T wave inversions   PROCEDURES  Procedure(s) performed (including Critical Care):  .Critical Care Performed by: Blake Divine, MD Authorized by: Blake Divine, MD   Critical care provider statement:    Critical care time (minutes):  60   Critical care time was exclusive of:  Separately billable procedures and treating other patients and teaching time   Critical care was necessary to treat or prevent imminent or life-threatening deterioration of the following conditions:  Cardiac failure   Critical care was time spent personally by me on the following activities:  Development of treatment plan with patient or surrogate, discussions with consultants, evaluation of patient's response to treatment, examination of patient, ordering and review of laboratory studies, ordering and review of radiographic studies, ordering and performing treatments and interventions, pulse oximetry, re-evaluation of patient's  condition and review of old charts   I assumed direction of critical care for this patient from another provider in my specialty: no     Care discussed with: admitting provider     ____________________________________________   INITIAL IMPRESSION / ASSESSMENT AND PLAN / ED COURSE      70 year old male with past medical history of CLL, hyperlipidemia, atrial tachycardia, and pericardial effusion  requiring recent pericardiocentesis who presents to the ED complaining of sudden onset chest pressure with difficulty taking a full deep breath about 1 hour prior to arrival.  Patient noted to be tachycardic and tachypneic but with stable blood pressure on my assessment.  EKG shows new right bundle branch block with sinus tachycardia but no acute ischemic changes.  I am concerned for recurrent pericardial effusion and bedside echocardiogram was attempted but with poor windows.  Plan to discuss with cardiology to facilitate stat echocardiogram.  Given tachycardia, tachypnea, and significant leukocytosis, we will start patient on broad-spectrum antibiotics for possible recurrent pneumonia.  Initial troponin within normal limits and I doubt ACS.  Case discussed with Dr. Rockey Situ of cardiology, unfortunately stat echo is unavailable due to no available tach at this time.  CTA was performed and shows improved size of pericardial effusion but does show PE with question of right heart strain.  With improved pericardial effusion and normalizing vital signs, doubt tamponade at this time and effusion seems less likely to be the cause of his symptoms.  Pain seems more likely to be due to PE and we will start patient on heparin.  Although his RV is greater than his LV at this time, this was also the case on his CTA performed 2 weeks ago and may not represent acute right heart strain.  Findings discussed with Dr. Feliberto Gottron of vascular surgery, who agrees that findings are less likely to represent acute right heart strain and  agrees with plan to start heparin with further evaluation in the morning.  He request that patient be made n.p.o. for potential intervention.  Patient with ongoing pain that we will treat with IV Dilaudid.  Case discussed with hospitalist for admission.      ____________________________________________   FINAL CLINICAL IMPRESSION(S) / ED DIAGNOSES  Final diagnoses:  Acute pulmonary embolism with acute cor pulmonale, unspecified pulmonary embolism type ([place])  Pericardial effusion     ED Discharge Orders     None        Note:  This document was prepared using Dragon voice recognition software and may include unintentional dictation errors.    Blake Divine, MD 10/16/21 [phone]

## 2021-10-16 NOTE — ED Notes (Signed)
Pt moved to room 11.  Family with pt

## 2021-10-16 NOTE — ED Triage Notes (Signed)
Pt c/o tachycardia, SOB and midsternum chest pain that pt reports feels like a brick under sternum. Pt seen and transferred 2 weeks ago to [facility] cardiac ICU for paracentesis.

## 2021-10-16 NOTE — Sepsis Progress Note (Signed)
Monitoring for the code sepsis protocol. °

## 2021-10-17 ENCOUNTER — Other Ambulatory Visit: Payer: Self-pay

## 2021-10-17 ENCOUNTER — Observation Stay (HOSPITAL_COMMUNITY)
Admit: 2021-10-17 | Discharge: 2021-10-17 | Disposition: A | Discharge status: Home / Self Care | Payer: Medicare PPO | Attending: Internal Medicine | Admitting: Internal Medicine

## 2021-10-17 ENCOUNTER — Other Ambulatory Visit (INDEPENDENT_AMBULATORY_CARE_PROVIDER_SITE_OTHER): Payer: Self-pay | Admitting: Vascular Surgery

## 2021-10-17 DIAGNOSIS — I3139 Other pericardial effusion (noninflammatory): Secondary | ICD-10-CM | POA: Diagnosis present

## 2021-10-17 DIAGNOSIS — K900 Celiac disease: Secondary | ICD-10-CM | POA: Diagnosis present

## 2021-10-17 DIAGNOSIS — E559 Vitamin D deficiency, unspecified: Secondary | ICD-10-CM | POA: Diagnosis present

## 2021-10-17 DIAGNOSIS — Z820 Family history of epilepsy and other diseases of the nervous system: Secondary | ICD-10-CM | POA: Diagnosis not present

## 2021-10-17 DIAGNOSIS — C9110 Chronic lymphocytic leukemia of B-cell type not having achieved remission: Secondary | ICD-10-CM

## 2021-10-17 DIAGNOSIS — M79661 Pain in right lower leg: Secondary | ICD-10-CM | POA: Diagnosis not present

## 2021-10-17 DIAGNOSIS — Z8249 Family history of ischemic heart disease and other diseases of the circulatory system: Secondary | ICD-10-CM | POA: Diagnosis not present

## 2021-10-17 DIAGNOSIS — R6510 Systemic inflammatory response syndrome (SIRS) of non-infectious origin without acute organ dysfunction: Secondary | ICD-10-CM | POA: Diagnosis present

## 2021-10-17 DIAGNOSIS — I2699 Other pulmonary embolism without acute cor pulmonale: Secondary | ICD-10-CM | POA: Diagnosis not present

## 2021-10-17 DIAGNOSIS — F17210 Nicotine dependence, cigarettes, uncomplicated: Secondary | ICD-10-CM | POA: Diagnosis present

## 2021-10-17 DIAGNOSIS — J9811 Atelectasis: Secondary | ICD-10-CM | POA: Diagnosis present

## 2021-10-17 DIAGNOSIS — J90 Pleural effusion, not elsewhere classified: Secondary | ICD-10-CM | POA: Diagnosis present

## 2021-10-17 DIAGNOSIS — I4510 Unspecified right bundle-branch block: Secondary | ICD-10-CM | POA: Diagnosis present

## 2021-10-17 DIAGNOSIS — M79662 Pain in left lower leg: Secondary | ICD-10-CM | POA: Diagnosis not present

## 2021-10-17 DIAGNOSIS — R0789 Other chest pain: Secondary | ICD-10-CM | POA: Diagnosis not present

## 2021-10-17 DIAGNOSIS — Z79899 Other long term (current) drug therapy: Secondary | ICD-10-CM | POA: Diagnosis not present

## 2021-10-17 DIAGNOSIS — Z85118 Personal history of other malignant neoplasm of bronchus and lung: Secondary | ICD-10-CM | POA: Diagnosis not present

## 2021-10-17 DIAGNOSIS — I2609 Other pulmonary embolism with acute cor pulmonale: Secondary | ICD-10-CM

## 2021-10-17 DIAGNOSIS — I2510 Atherosclerotic heart disease of native coronary artery without angina pectoris: Secondary | ICD-10-CM | POA: Diagnosis present

## 2021-10-17 DIAGNOSIS — I309 Acute pericarditis, unspecified: Secondary | ICD-10-CM | POA: Diagnosis present

## 2021-10-17 DIAGNOSIS — E785 Hyperlipidemia, unspecified: Secondary | ICD-10-CM | POA: Diagnosis present

## 2021-10-17 DIAGNOSIS — Z20822 Contact with and (suspected) exposure to covid-19: Secondary | ICD-10-CM | POA: Diagnosis present

## 2021-10-17 DIAGNOSIS — I700 Atherosclerosis of aorta: Secondary | ICD-10-CM | POA: Diagnosis present

## 2021-10-17 LAB — URINALYSIS, COMPLETE (UACMP) WITH MICROSCOPIC
Bacteria, UA: NONE SEEN
Bilirubin Urine: NEGATIVE
Glucose, UA: NEGATIVE mg/dL
Hgb urine dipstick: NEGATIVE
Ketones, ur: NEGATIVE mg/dL
Leukocytes,Ua: NEGATIVE
Nitrite: NEGATIVE
Protein, ur: NEGATIVE mg/dL
Specific Gravity, Urine: 1.024 (ref 1.005–1.030)
Squamous Epithelial / HPF: NONE SEEN (ref 0–5)
pH: 6 (ref 5.0–8.0)

## 2021-10-17 LAB — BASIC METABOLIC PANEL
Anion gap: 6 (ref 5–15)
BUN: 9 mg/dL (ref 8–23)
CO2: 24 mmol/L (ref 22–32)
Calcium: 8.9 mg/dL (ref 8.9–10.3)
Chloride: 105 mmol/L (ref 98–111)
Creatinine, Ser: 0.53 mg/dL — ABNORMAL LOW (ref 0.61–1.24)
GFR, Estimated: >60 mL/min (ref >60)
Glucose, Bld: 126 mg/dL — ABNORMAL HIGH (ref 70–99)
Potassium: 4 mmol/L (ref 3.5–5.1)
Sodium: 135 mmol/L (ref 135–145)

## 2021-10-17 LAB — CBC
HCT: 38.4 % — ABNORMAL LOW (ref 39.0–52.0)
Hemoglobin: 12.7 g/dL — ABNORMAL LOW (ref 13.0–17.0)
MCH: 29.5 pg (ref 26.0–34.0)
MCHC: 33.1 g/dL (ref 30.0–36.0)
MCV: 89.3 fL (ref 80.0–100.0)
Platelets: 344 10*3/uL (ref 150–400)
RBC: 4.3 MIL/uL (ref 4.22–5.81)
RDW: 14.6 % (ref 11.5–15.5)
WBC: 17.3 10*3/uL — ABNORMAL HIGH (ref 4.0–10.5)
nRBC: 0 % (ref 0.0–0.2)

## 2021-10-17 LAB — ECHOCARDIOGRAM COMPLETE
AR max vel: 3.04 cm2
AV Area VTI: 2.92 cm2
AV Area mean vel: 2.84 cm2
AV Mean grad: 3 mmHg
AV Peak grad: 6.1 mmHg
Ao pk vel: 1.23 m/s
Area-P 1/2: 4.6 cm2
MV VTI: 2.93 cm2
S' Lateral: 2.64 cm

## 2021-10-17 LAB — HEPARIN LEVEL (UNFRACTIONATED)
Heparin Unfractionated: 0.11 IU/mL — ABNORMAL LOW (ref 0.30–0.70)
Heparin Unfractionated: 0.14 IU/mL — ABNORMAL LOW (ref 0.30–0.70)
Heparin Unfractionated: 0.12 IU/mL — ABNORMAL LOW (ref 0.30–0.70)

## 2021-10-17 MED ORDER — SODIUM CHLORIDE 0.9 % IV SOLN: INTRAVENOUS | Status: DC

## 2021-10-17 MED ORDER — FAMOTIDINE 20 MG PO TABS: 40.0000 mg | ORAL_TABLET | Freq: Once | ORAL | Status: DC | PRN

## 2021-10-17 MED ORDER — MORPHINE SULFATE (PF) 2 MG/ML IV SOLN
2.0000 mg | INTRAVENOUS | Status: DC | PRN
  Administered 2021-10-17 – 2021-10-18 (×3): 2 mg via INTRAVENOUS
  Filled 2021-10-17 (×3): qty 1

## 2021-10-17 MED ORDER — ONDANSETRON HCL 4 MG/2ML IJ SOLN: 4.0000 mg | Freq: Four times a day (QID) | INTRAMUSCULAR | Status: DC | PRN

## 2021-10-17 MED ORDER — BENZONATATE 100 MG PO CAPS: 200.0000 mg | ORAL_CAPSULE | Freq: Two times a day (BID) | ORAL | Status: DC | PRN

## 2021-10-17 MED ORDER — METHYLPREDNISOLONE SODIUM SUCC 125 MG IJ SOLR
125.0000 mg | Freq: Every day | INTRAMUSCULAR | Status: DC
  Administered 2021-10-17 – 2021-10-18 (×2): 125 mg via INTRAVENOUS
  Filled 2021-10-17 (×2): qty 2

## 2021-10-17 MED ORDER — HYDROCOD POLST-CPM POLST ER 10-8 MG/5ML PO SUER: 5.0000 mL | Freq: Every evening | ORAL | Status: DC | PRN

## 2021-10-17 MED ORDER — OXYCODONE HCL 5 MG PO TABS
5.0000 mg | ORAL_TABLET | ORAL | Status: DC | PRN
  Administered 2021-10-17 – 2021-10-18 (×2): 5 mg via ORAL
  Filled 2021-10-17 (×2): qty 1

## 2021-10-17 MED ORDER — ALLOPURINOL 300 MG PO TABS
300.0000 mg | ORAL_TABLET | Freq: Two times a day (BID) | ORAL | Status: DC
  Administered 2021-10-18: 300 mg via ORAL
  Filled 2021-10-17 (×2): qty 1

## 2021-10-17 MED ORDER — HEPARIN BOLUS VIA INFUSION
2100.0000 [IU] | INTRAVENOUS | Status: AC
  Administered 2021-10-17: 2100 [IU] via INTRAVENOUS
  Filled 2021-10-17: qty 2100

## 2021-10-17 MED ORDER — MORPHINE SULFATE (PF) 4 MG/ML IV SOLN
4.0000 mg | INTRAVENOUS | Status: AC | PRN
  Administered 2021-10-17 (×3): 4 mg via INTRAVENOUS
  Filled 2021-10-17 (×3): qty 1

## 2021-10-17 MED ORDER — FENTANYL CITRATE PF 50 MCG/ML IJ SOSY: 50.0000 ug | PREFILLED_SYRINGE | INTRAMUSCULAR | Status: DC | PRN

## 2021-10-17 MED ORDER — CHLORHEXIDINE GLUCONATE CLOTH 2 % EX PADS: 6.0000 | MEDICATED_PAD | Freq: Every day | CUTANEOUS | Status: DC

## 2021-10-17 MED ORDER — CEFAZOLIN SODIUM-DEXTROSE 2-4 GM/100ML-% IV SOLN
2.0000 g | Freq: Once | INTRAVENOUS | Status: AC
  Administered 2021-10-18: 2 g via INTRAVENOUS
  Filled 2021-10-17: qty 100

## 2021-10-17 MED ORDER — NYSTATIN 100000 UNIT/ML MT SUSP: 5.0000 mL | Freq: Four times a day (QID) | OROMUCOSAL | Status: DC

## 2021-10-17 MED ORDER — METHYLPREDNISOLONE SODIUM SUCC 125 MG IJ SOLR: 125.0000 mg | Freq: Once | INTRAMUSCULAR | Status: DC | PRN

## 2021-10-17 MED ORDER — FENTANYL CITRATE PF 50 MCG/ML IJ SOSY
50.0000 ug | PREFILLED_SYRINGE | INTRAMUSCULAR | Status: AC | PRN
  Administered 2021-10-17 (×3): 50 ug via INTRAVENOUS
  Filled 2021-10-17 (×3): qty 1

## 2021-10-17 MED ORDER — FENTANYL CITRATE PF 50 MCG/ML IJ SOSY
50.0000 ug | PREFILLED_SYRINGE | INTRAMUSCULAR | Status: DC | PRN
  Administered 2021-10-17: 50 ug via INTRAVENOUS
  Filled 2021-10-17: qty 1

## 2021-10-17 MED ORDER — MIDAZOLAM HCL 2 MG/ML PO SYRP
8.0000 mg | ORAL_SOLUTION | Freq: Once | ORAL | Status: DC | PRN
  Filled 2021-10-17: qty 4

## 2021-10-17 MED ORDER — COLCHICINE 0.6 MG PO TABS
0.6000 mg | ORAL_TABLET | Freq: Every day | ORAL | Status: DC
  Administered 2021-10-17 – 2021-10-18 (×2): 0.6 mg via ORAL
  Filled 2021-10-17 (×2): qty 1

## 2021-10-17 MED ORDER — HEPARIN BOLUS VIA INFUSION
2100.0000 [IU] | Freq: Once | INTRAVENOUS | Status: AC
  Administered 2021-10-17: 2100 [IU] via INTRAVENOUS
  Filled 2021-10-17: qty 2100

## 2021-10-17 MED ORDER — DIPHENHYDRAMINE HCL 50 MG/ML IJ SOLN: 50.0000 mg | Freq: Once | INTRAMUSCULAR | Status: DC | PRN

## 2021-10-17 MED ORDER — HYDROMORPHONE HCL 1 MG/ML IJ SOLN: 1.0000 mg | Freq: Once | INTRAMUSCULAR | Status: DC | PRN

## 2021-10-17 NOTE — Progress Notes (Signed)
*  PRELIMINARY RESULTS* Echocardiogram 2D Echocardiogram has been performed.  Wallie Char Christi Wirick 10/17/2021, 9:43 AM

## 2021-10-17 NOTE — ED Notes (Signed)
Pt given ginger ale at this time.

## 2021-10-17 NOTE — Progress Notes (Signed)
ANTICOAGULATION CONSULT NOTE  Pharmacy Consult for heparin infusion Indication: pulmonary embolus  Allergies  Allergen Reactions   Gluten Meal     Gluten intolerance     Patient Measurements:   Heparin Dosing Weight: 70.1 kg  Vital Signs: Temp: 98.6 F (37 C) (11/20 2022) Temp Source: Oral (11/20 2022) BP: 106/71 (11/21 0500) Pulse Rate: 100 (11/21 0500)  Labs: Recent Labs    10/16/21 2022 10/16/21 2227 10/17/21 0527  HGB 14.9  --  12.7*  HCT 44.5  --  38.4*  PLT 420*  --  344  APTT 32  --   --   LABPROT 13.6  --   --   INR 1.0  --   --   HEPARINUNFRC  --   --  0.11*  CREATININE 0.64  --  0.53*  TROPONINIHS 3 3  --      Estimated Creatinine Clearance: 79.8 mL/min (A) (by C-G formula based on SCr of 0.53 mg/dL (L)).   Medical History: Past Medical History:  Diagnosis Date   Atherosclerosis of coronary artery 09/28/2016   Chest CT Nov 2017   Atrial tachycardia (HCC)    CLL (chronic lymphocytic leukemia) (HCC)    Elevated C-reactive protein (CRP)    Elevated PSA    Fractured sternum 1999   Herpes    History of rib fracture 1999   multiple   Hyperlipidemia    Psoriasis    Rheumatoid factor positive 02/2014   Rosacea    Thoracic aortic atherosclerosis ([place]) 09/28/2016   Chest CT Nov 2017   Tobacco abuse    Vitamin D deficiency disease     Assessment: Pt is 70 yo male presenting with sudden onset of SOB and midsternum CP found with acute pulmonary embolism.  Goal of Therapy:  Heparin level 0.3-0.7 units/ml Monitor platelets by anticoagulation protocol: Yes  11/21 0527 HL 0.11, subtherapeutic   Plan:  Bolus 2100 units x 1 Increase heparin infusion to 1350 units/hr Recheck HL in 6 hr after rate change CBC daily while on heparin  Renda Rolls, PharmD, [facility] 10/17/2021 6:06 AM

## 2021-10-17 NOTE — ED Notes (Signed)
Pt states that he is hungry and would like something to eat. Explained that I would have to get in touch with the Vascular Surgeon about when he would be able to eat. Per Dr. Lucky Cowboy with vascular surgery pt has no planned procedure today and will be able to eat. Notified Dr. Posey Pronto, Attending at this time.

## 2021-10-17 NOTE — Assessment & Plan Note (Addendum)
#  70 year old male patient 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  #SLL/CLL-currently s/p Gazyva plus venetoclax.  Significant partial response noted-significant improvement of the lymphadenopathy.  Would recommend holding further therapy given acute issues/see below.  Stable.  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  Infections-versus inflammation; again as discussed above less likely malignancy-likely cause of patient's current symptoms of pleuritic chest pain. + Significant improvement noted on steroids.  Defer to further oral steroids to cardiology.  #Right lower lobe low-volume PE-currently on IV heparin.  Discussed with vascular surgery.  No plans for thrombectomy.  Patient can be transitioned to Eliquis.  Discussed with Dr. Posey Pronto.  #Of care was discussed with the patient and his wife by the bedside in detail.  They are in agreement.  We will have the patient follow-up in the clinic in approximately 2 weeks.

## 2021-10-17 NOTE — Progress Notes (Signed)
ANTICOAGULATION CONSULT NOTE  Pharmacy Consult for heparin infusion Indication: pulmonary embolus  Patient Measurements: Height: 5' 6"  (167.6 cm) Weight: 73.3 kg (161 lb 11.2 oz) IBW/kg (Calculated) : 63.8 Heparin Dosing Weight: 70.1 kg  Labs: Recent Labs    10/16/21 2022 10/16/21 2227 10/17/21 0527 10/17/21 1334 10/17/21 2139  HGB 14.9  --  12.7*  --   --   HCT 44.5  --  38.4*  --   --   PLT 420*  --  344  --   --   APTT 32  --   --   --   --   LABPROT 13.6  --   --   --   --   INR 1.0  --   --   --   --   HEPARINUNFRC  --   --  0.11* 0.14* 0.12*  CREATININE 0.64  --  0.53*  --   --   TROPONINIHS 3 3  --   --   --      Estimated Creatinine Clearance: 79.8 mL/min (A) (by C-G formula based on SCr of 0.53 mg/dL (L)).   Medical History: Past Medical History:  Diagnosis Date   Atherosclerosis of coronary artery 09/28/2016   Chest CT Nov 2017   Atrial tachycardia (HCC)    CLL (chronic lymphocytic leukemia) (HCC)    Elevated C-reactive protein (CRP)    Elevated PSA    Fractured sternum 1999   Herpes    History of rib fracture 1999   multiple   Hyperlipidemia    Psoriasis    Rheumatoid factor positive 02/2014   Rosacea    Thoracic aortic atherosclerosis ([place]) 09/28/2016   Chest CT Nov 2017   Tobacco abuse    Vitamin D deficiency disease     Assessment: Pt is 70 yo male presenting with sudden onset of SOB and midsternum CP found with acute pulmonary embolism.  Goal of Therapy:  Heparin level 0.3-0.7 units/ml Monitor platelets by anticoagulation protocol: Yes  Date/time HL Comment 11/21 0527  0.11  Subtherapeutic; 1150u/hr 11/21 1334  0.14 Subtherapeutic; 1350u/hr 11/21 2139  0.12 Subtherapeutic; 1600u/hr  Plan:  --Heparin level is subtherapeutic --Heparin 2100 unit IV bolus and increase heparin infusion rate to 1850 units/hr --Re-check HL 6 hours after rate change --Daily CBC per protocol while on IV heparin  Benita Gutter 10/17/2021 10:34  PM

## 2021-10-17 NOTE — Progress Notes (Signed)
  Progress Note    Andres Mullins   GPQ:[id number]  DOB: Oct 06, 1951  DOA: 10/16/2021     0 Date of Service: 10/17/2021   Clinical Course Patient presented with complaints of chest pain.  Found to have acute PE.  Vascular surgery consulted due to symptom burden.  Cardiology consult due to history pericardial effusion.  Hematology also following.  Assessment and Plan # Pulmonary embolism-present admission - Patient will need etiology work-up, however I suspect this is secondary to CLL - On CTA chest read: While there is CT evidence of right heart strain, appears unchanged from prior CT examination of 10/02/2021 and as such right heart strain may not be attributed to acute pulmonary embolism, in fact, may reflect a chronic underlying abnormality - Complete echo ordered we will await cardiology recommendation regarding right heart strain. Vascular surgery following.  Possible mechanical thrombectomy scheduled. Continue pain control.   # Patient met SIRS criteria - Elevated heart rate, respiration rate, leukocytosis and lactic acid - I have low clinical suspicion for infectious etiology at this time given that procalcitonin is negative, patient did not have any symptoms of infectious etiology, MRSA PCR is negative - Ordered UA - Leukocytosis presumed secondary to CLL and/or reactive to pulmonary embolism at this time - Blood cultures x2 have been ordered and are in process   # Left lung pleural effusion-was read as decreased in size on CTA imaging   # Persistent total collapse of left lower lobe-incentive spirometry, flutter valve   # CLL-outpatient follow-up with hematologist/oncologist   Pericardial effusion. Appreciate cardiology assistance. Echocardiogram ordered. No significant pericardial effusion seen. Significant pericardial thickness seen. Will await further recommendation from cardiology and hematology.  Subjective:  Continues to have chest pain.  No nausea no vomiting.   Continues to have shortness of breath as well as cough.  No fever no chills.  Objective Vitals:   10/17/21 1630 10/17/21 1700 10/17/21 1800 10/17/21 2001  BP: 105/75 110/77 104/76 122/83  Pulse: 97 98 96 (!) 111  Resp: 20 (!) 23 (!) 22 16  Temp:    97.8 F (36.6 C)  TempSrc:    Oral  SpO2: 93% 92% 92% 97%      Blood pressure stable. Exam General: Appear in mild distress, no Rash; Oral Mucosa Clear, moist. no Abnormal Neck Mass Or lumps, Conjunctiva normal  Cardiovascular: S1 and S2 Present, no Murmur, Respiratory: increased respiratory effort, Bilateral Air entry present and bilateral  Crackles, no wheezes Abdomen: Bowel Sound present, Soft and no tenderness Extremities: trace Pedal edema Neurology: alert and oriented to time, place, and person affect appropriate. no new focal deficit Gait not checked due to patient safety concerns    Labs / Other Information My review of labs, imaging, notes and other tests is significant for     preserved EF.   Disposition Plan: Status is: Inpatient  Remains inpatient appropriate because: Further work-up as well as treatment by vascular surgery and cardiology.  Currently on IV heparin.  Time spent: 35 minutes [facility] 10/17/2021, 8:14 PM

## 2021-10-17 NOTE — Consult Note (Signed)
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 parameters were normal.  2. Right ventricular systolic function is mildly reduced. The right ventricular size is mildly enlarged.  3. Prominent epicardial fat moderate appearing effusion primarily over RV free wall No obvious tamoponade althought there is respiratory variation in TR inflows Mitral inflows with respirometer not done . Moderate pericardial effusion.  4. The mitral valve is normal in structure. No evidence of mitral valve regurgitation. No evidence of mitral stenosis.  5. The aortic valve is normal in structure. Aortic valve regurgitation is not visualized. No aortic stenosis is present.  6. The inferior vena cava is normal in size with greater than 50% respiratory variability, suggesting right atrial pressure of 3 mmHg. FINDINGS  Left Ventricle: Abnormal septal motion. Left ventricular ejection fraction, by estimation, is 60 to 65%. The left ventricle has normal function. The left ventricle has no regional wall motion abnormalities. The left ventricular internal cavity size was normal in size. There is no left  ventricular  hypertrophy. Left ventricular diastolic parameters were normal. Right Ventricle: The right ventricular size is mildly enlarged. Right vetricular wall thickness was not assessed. Right ventricular systolic function is mildly reduced. Left Atrium: Left atrial size was normal in size. Right Atrium: Right atrial size was normal in size. Pericardium: Prominent epicardial fat moderate appearing effusion primarily over RV free wall No obvious tamoponade althought there is respiratory variation in TR inflows Mitral inflows with respirometer not done. A moderately sized pericardial effusion is present. Mitral Valve: The mitral valve is normal in structure. No evidence of mitral valve regurgitation. No evidence of mitral valve stenosis. Tricuspid Valve: The tricuspid valve is normal in structure. Tricuspid valve regurgitation is mild . No evidence of tricuspid stenosis. Aortic Valve: The aortic valve is normal in structure. Aortic valve regurgitation is not visualized. No aortic stenosis is present. Pulmonic Valve: The pulmonic valve was normal in structure. Pulmonic valve regurgitation is not visualized. No evidence of pulmonic stenosis. Aorta: The aortic root is normal in size and structure. Venous: The inferior vena cava is normal in size with greater than 50% respiratory variability, suggesting right atrial pressure of 3 mmHg. IAS/Shunts: No atrial level shunt detected by color flow Doppler.  LEFT VENTRICLE PLAX 2D LVIDd:         4.00 cm   Diastology LVIDs:         3.00 cm   LV e' medial:  5.87 cm/s LV PW:         1.10 cm   LV e' lateral: 7.40 cm/s LV IVS:        0.90 cm LVOT diam:     2.00 cm LV SV:         42 LV SV Index:   23 LVOT Area:     3.14 cm  RIGHT VENTRICLE            IVC RV S prime:     8.70 cm/s  IVC diam: 2.30 cm LEFT ATRIUM             Index        RIGHT ATRIUM           Index LA diam:        3.30 cm 1.81 cm/m   RA Area:     11.80 cm LA Vol (A2C):   39.3 ml 21.52 ml/m  RA Volume:   24.50 ml   13.41 ml/m LA Vol (A4C):   27.0 ml 14.78 ml/m LA Biplane Vol: 34.2 ml 18.72 ml/m  AORTIC VALVE LVOT Vmax:   105.00 cm/s LVOT Vmean:  69.000 cm/s LVOT VTI:    0.135 m  AORTA Ao Root diam: 3.70 cm Ao Asc diam:  3.50 cm  SHUNTS Systemic VTI:  0.14 m Systemic Diam: 2.00 cm Jenkins Rouge MD Electronically signed by Jenkins Rouge MD Signature Date/Time: 10/03/2021/8:36:30 AM    Final    ECHOCARDIOGRAM LIMITED  Result Date: 10/04/2021    ECHOCARDIOGRAM LIMITED REPORT   Patient Name:   ICKER SWIGERT [facility] Date of Exam: 10/04/2021 Medical Rec #:  [id number]       Height:       66.0 in Accession #:    [id number]      Weight:       164.2 lb Date of Birth:  1951/02/22       BSA:          1.839 m Patient Age:    54 years        BP:  123/79 mmHg Patient Gender: M               HR:           101 bpm. Exam Location:  Inpatient Procedure: Limited Echo Indications:    Pericardial effusion  History:        Patient has prior history of Echocardiogram examinations, most                 recent 10/03/2021. Pericardiocentesis yesterday, Arrythmias:PVC;                 Risk Factors:Dyslipidemia and Current Smoker. Pericardial                 effusion post percardial centesis.  Sonographer:    Merrie Roof RDCS Referring Phys: [place]  1. Left ventricular ejection fraction, by estimation, is 55 to 60%. The left ventricle has normal function. The left ventricle has no regional wall motion abnormalities. Left ventricular diastolic parameters are consistent with Grade I diastolic dysfunction (impaired relaxation).  2. Right ventricular systolic function is normal. The right ventricular size is normal.  3. A small pericardial effusion is present. The pericardial effusion is lateral to the left ventricle. There is no evidence of cardiac tamponade.  4. The mitral valve is normal in structure. No evidence of mitral valve regurgitation.  5. The aortic valve is grossly normal.  6. The inferior vena cava is dilated in  size with >50% respiratory variability, suggesting right atrial pressure of 8 mmHg. 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 FINDINGS  Left Ventricle: Left ventricular ejection fraction, by estimation, is 55 to 60%. The left ventricle has normal function. The left ventricle has no regional wall motion abnormalities. Left ventricular diastolic parameters are consistent with Grade I diastolic dysfunction (impaired relaxation). Right Ventricle: The right ventricular size is normal. No increase in right ventricular wall thickness. Right ventricular systolic function is normal. Left Atrium: Left atrial size was normal in size. Right Atrium: Right atrial size was normal in size. Pericardium: A small pericardial effusion is present. The pericardial effusion is lateral to the left ventricle. There is no evidence of cardiac tamponade. Mitral Valve: The mitral valve is normal in structure. Tricuspid Valve: The tricuspid valve is normal in structure. Tricuspid valve regurgitation is not demonstrated. Aortic Valve: The aortic valve is grossly normal. Pulmonic Valve: The pulmonic valve was not well visualized. Aorta: The aortic root is normal in size and structure. Venous: The inferior vena cava is dilated in size with greater than 50% respiratory variability, suggesting right atrial pressure of 8 mmHg. IAS/Shunts: The interatrial septum was not assessed. IVC IVC diam: 2.70 cm Sanda Klein MD Electronically signed by Sanda Klein MD Signature Date/Time: 10/04/2021/1:10:18 PM    Final    ECHOCARDIOGRAM LIMITED  Result Date: 10/03/2021    ECHOCARDIOGRAM LIMITED REPORT   Patient Name:   ERICKSON YAMASHIRO [facility] Date of Exam: 10/03/2021 Medical Rec #:  [id number]       Height:       66.0 in Accession #:    [id number]      Weight:       161.6 lb Date of Birth:  23-Mar-1951       BSA:          1.827 m Patient Age:    61 years        BP:  75/110 mmHg  Patient Gender: M               HR:           116 bpm. Exam Location:  Inpatient Procedure: Limited Echo and Cardiac Doppler Indications:    I31.3 Pericardial effusion (noninflammatory)  History:        Patient has prior history of Echocardiogram examinations, most                 recent 10/03/2021. Risk Factors:Current Smoker and Dyslipidemia.                 Cardiac tamponade.  Sonographer:    Roseanna Rainbow RDCS Referring Phys: [place]  Sonographer Comments: Pericardiocentesis study. IMPRESSIONS  1. Abnormal septal motion . Left ventricular ejection fraction, by estimation, is 50 to 55%. The left ventricle has low normal function. The left ventricle has no regional wall motion abnormalities. The left ventricular internal cavity size was mildly dilated.  2. Right ventricular systolic function is normal. The right ventricular size is normal.  3. Trivial with no tamponade     . The pericardial effusion is lateral to the left ventricle.  4. The mitral valve is abnormal. No evidence of mitral valve regurgitation. No evidence of mitral stenosis.  5. The aortic valve was not well visualized. Aortic valve regurgitation is mild. Mild aortic valve sclerosis is present, with no evidence of aortic valve stenosis.  6. The inferior vena cava is normal in size with greater than 50% respiratory variability, suggesting right atrial pressure of 3 mmHg. FINDINGS  Left Ventricle: Abnormal septal motion. Left ventricular ejection fraction, by estimation, is 50 to 55%. The left ventricle has low normal function. The left ventricle has no regional wall motion abnormalities. The left ventricular internal cavity size was mildly dilated. There is no left ventricular hypertrophy. Right Ventricle: The right ventricular size is normal. No increase in right ventricular wall thickness. Right ventricular systolic function is normal. Left Atrium: Left atrial size was normal in size. Right Atrium: Right atrial size was normal in size.  Pericardium: Trivial with no tamponade. Trivial pericardial effusion is present. The pericardial effusion is lateral to the left ventricle. Mitral Valve: The mitral valve is abnormal. There is mild thickening of the mitral valve leaflet(s). There is mild calcification of the mitral valve leaflet(s). No evidence of mitral valve stenosis. Tricuspid Valve: The tricuspid valve is normal in structure. Tricuspid valve regurgitation is trivial. No evidence of tricuspid stenosis. Aortic Valve: The aortic valve was not well visualized. Aortic valve regurgitation is mild. Mild aortic valve sclerosis is present, with no evidence of aortic valve stenosis. Pulmonic Valve: The pulmonic valve was normal in structure. Pulmonic valve regurgitation is not visualized. No evidence of pulmonic stenosis. Aorta: The aortic root is normal in size and structure. Venous: The inferior vena cava is normal in size with greater than 50% respiratory variability, suggesting right atrial pressure of 3 mmHg. IAS/Shunts: No atrial level shunt detected by color flow Doppler. 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  #SLL/CLL-currently s/p Gazyva plus venetoclax.  Significant partial response noted-significant improvement of the lymphadenopathy.  Would recommend holding further therapy given acute issues/see below.  Clinically patient is pericardial  effusion/pericarditis unlikely related to patient's underlying hematologic malignancy or any chemotherapeutic side effects.  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  Infections-versus inflammation; again as discussed above less likely malignancy-likely cause of patient's current symptoms  of pleuritic chest pain.  Most recently on colchicine.  #Right lower lobe low-volume PE-currently on IV heparin.  Plan/recommendations:  #Discussed with the patient whether to continue to hold patient's chemotherapy-given acute issues at this time.  #With regards to acute pericarditis-I think is reasonable to start steroids; no contraindications from oncology standpoint.  #With regards to acute PE-IV heparin could be transition to Eliquis at the time of discharge.   Thank you Dr.Patel  for allowing me to participate in the care of your pleasant patient. Please do not hesitate to contact me with questions or concerns in the interim.  The above plan of care was discussed with the patient and his wife by the bedside in detail.  They are in agreement.  All questions were answered. The patient knows to call the clinic with any problems, questions or concerns.    Cammie Sickle, MD 10/17/2021 8:48 PM

## 2021-10-17 NOTE — Progress Notes (Signed)
ANTICOAGULATION CONSULT NOTE  Pharmacy Consult for heparin infusion Indication: pulmonary embolus  Allergies  Allergen Reactions   Gluten Meal     Gluten intolerance     Patient Measurements:   Heparin Dosing Weight: 70.1 kg  Vital Signs: BP: 109/75 (11/21 1330) Pulse Rate: 96 (11/21 1330)  Labs: Recent Labs    10/16/21 2022 10/16/21 2227 10/17/21 0527 10/17/21 1334  HGB 14.9  --  12.7*  --   HCT 44.5  --  38.4*  --   PLT 420*  --  344  --   APTT 32  --   --   --   LABPROT 13.6  --   --   --   INR 1.0  --   --   --   HEPARINUNFRC  --   --  0.11* 0.14*  CREATININE 0.64  --  0.53*  --   TROPONINIHS 3 3  --   --      Estimated Creatinine Clearance: 79.8 mL/min (A) (by C-G formula based on SCr of 0.53 mg/dL (L)).   Medical History: Past Medical History:  Diagnosis Date   Atherosclerosis of coronary artery 09/28/2016   Chest CT Nov 2017   Atrial tachycardia (HCC)    CLL (chronic lymphocytic leukemia) (HCC)    Elevated C-reactive protein (CRP)    Elevated PSA    Fractured sternum 1999   Herpes    History of rib fracture 1999   multiple   Hyperlipidemia    Psoriasis    Rheumatoid factor positive 02/2014   Rosacea    Thoracic aortic atherosclerosis ([place]) 09/28/2016   Chest CT Nov 2017   Tobacco abuse    Vitamin D deficiency disease     Assessment: Pt is 70 yo male presenting with sudden onset of SOB and midsternum CP found with acute pulmonary embolism.  Goal of Therapy:  Heparin level 0.3-0.7 units/ml Monitor platelets by anticoagulation protocol: Yes  Date/time HL Comment 11/21 0527  0.11  Subtherapeutic  @1150u /hr 11/21 1334  0.14 Subtherapeutic @ 1350u/hr   Plan:  Bolus 2100 units x 1 Increase heparin infusion to 1600 units/hr Recheck HL in 6 hr after rate change CBC daily while on heparin  Sanai Frick Rodriguez-Guzman PharmD, BCPS 10/17/2021 3:05 PM

## 2021-10-17 NOTE — ED Notes (Signed)
Pts O2 sats decreased to 88% while sleeping - pt provided 2L [place] for supportive intervention and it improved his O2 to 98%.

## 2021-10-17 NOTE — Consult Note (Addendum)
Reason for Consult:Acute PE Referring Physician: ED  Andres Mullins is an 70 y.o. male.  HPI: Patient comes to the ED with labored breathing after cardiocentesis and now found to have acute PE with small clot burden.  He has been on heparin gtt since his admission to the ED.  Continues with labored breathing.  States that after his cardiocentesis he felt better, but soon after developed labored breathing and he is not feeling well.    Past Medical History:  Diagnosis Date   Atherosclerosis of coronary artery 09/28/2016   Chest CT Nov 2017   Atrial tachycardia (HCC)    CLL (chronic lymphocytic leukemia) (HCC)    Elevated C-reactive protein (CRP)    Elevated PSA    Fractured sternum 1999   Herpes    History of rib fracture 1999   multiple   Hyperlipidemia    Psoriasis    Rheumatoid factor positive 02/2014   Rosacea    Thoracic aortic atherosclerosis ([place]) 09/28/2016   Chest CT Nov 2017   Tobacco abuse    Vitamin D deficiency disease     Past Surgical History:  Procedure Laterality Date   APPENDECTOMY  1974   PERICARDIOCENTESIS N/A 10/03/2021   Procedure: PERICARDIOCENTESIS;  Surgeon: Jettie Booze, MD;  Location: [facility];  Service: Cardiovascular;  Laterality: N/A;   PORTA CATH INSERTION N/A 06/06/2021   Procedure: PORTA CATH INSERTION;  Surgeon: Algernon Huxley, MD;  Location: [facility];  Service: Cardiovascular;  Laterality: N/A;   SHOULDER ARTHROSCOPY  10/23/2016   Procedure: ARTHROSCOPY SHOULDER WITH DISTAL CLAVICLE EXCISION, PARTIAL ACROMIONECTOMY, AND DEBRIDEMENT;  Surgeon: Earnestine Leys, MD;  Location: [facility];  Service: Orthopedics;;   TIBIA FRACTURE SURGERY Left 2004   titanium rod, ORIF    Family History  Problem Relation Age of Onset   Heart disease Father    Osteoarthritis Father    Alzheimer's disease Father    Cancer Neg Hx    Diabetes Neg Hx    Hypertension Neg Hx    Stroke Neg Hx    COPD Neg Hx     Social History:  reports  that he has been smoking cigarettes. He has a 22.50 pack-year smoking history. He has never used smokeless tobacco. He reports current alcohol use of about 3.0 standard drinks per week. He reports that he does not use drugs.  Allergies:  Allergies  Allergen Reactions   Gluten Meal     Gluten intolerance     Medications: I have reviewed the patient's current medications. Prior to Admission: (Not in a hospital admission)  Scheduled:  [START ON 10/18/2021] allopurinol  300 mg Oral BID   colchicine  0.6 mg Oral Daily   Continuous:  heparin 1,350 Units/hr (10/17/21 0728)   lactated ringers 150 mL/hr at 10/17/21 0616   BHA:[id number] **OR** acetaminophen, benzonatate, chlorpheniramine-HYDROcodone, fentaNYL (SUBLIMAZE) injection, morphine injection, ondansetron **OR** ondansetron (ZOFRAN) IV Anti-infectives (From admission, onward)    Start     Dose/Rate Route Frequency Ordered Stop   10/16/21 2115  vancomycin (VANCOCIN) IVPB 1000 mg/200 mL premix  Status:  Discontinued        1,000 mg 200 mL/hr over 60 Minutes Intravenous  Once 10/16/21 2102 10/16/21 2105   10/16/21 2115  ceFEPIme (MAXIPIME) 2 g in sodium chloride 0.9 % 100 mL IVPB        2 g 200 mL/hr over 30 Minutes Intravenous  Once 10/16/21 2102 10/16/21 2227   10/16/21 2115  vancomycin (VANCOREADY)  IVPB 1500 mg/300 mL        1,500 mg 150 mL/hr over 120 Minutes Intravenous  Once 10/16/21 2105 10/16/21 2336       Results for orders placed or performed during the hospital encounter of 10/16/21 (from the past 48 hour(s))  Basic metabolic panel     Status: Abnormal   Collection Time: 10/16/21  8:22 PM  Result Value Ref Range   Sodium 137 135 - 145 mmol/L   Potassium 3.9 3.5 - 5.1 mmol/L   Chloride 102 98 - 111 mmol/L   CO2 27 22 - 32 mmol/L   Glucose, Bld 139 (H) 70 - 99 mg/dL    Comment: Glucose reference range applies only to samples taken after fasting for at least 8 hours.   BUN 11 8 - 23 mg/dL   Creatinine, Ser 0.64  0.61 - 1.24 mg/dL   Calcium 9.9 8.9 - 10.3 mg/dL   GFR, Estimated >60 >60 mL/min    Comment: (NOTE) Calculated using the CKD-EPI Creatinine Equation (2021)    Anion gap 8 5 - 15    Comment: Performed at [facility], [address] [place], [place] [address]  CBC     Status: Abnormal   Collection Time: 10/16/21  8:22 PM  Result Value Ref Range   WBC 26.4 (H) 4.0 - 10.5 K/uL   RBC 4.96 4.22 - 5.81 MIL/uL   Hemoglobin 14.9 13.0 - 17.0 g/dL   HCT 44.5 39.0 - 52.0 %   MCV 89.7 80.0 - 100.0 fL   MCH 30.0 26.0 - 34.0 pg   MCHC 33.5 30.0 - 36.0 g/dL   RDW 14.5 11.5 - 15.5 %   Platelets 420 (H) 150 - 400 K/uL   nRBC 0.0 0.0 - 0.2 %    Comment: Performed at [facility], [place], [place] [address]  Troponin I (High Sensitivity)     Status: None   Collection Time: 10/16/21  8:22 PM  Result Value Ref Range   Troponin I (High Sensitivity) 3 <18 ng/L    Comment: (NOTE) Elevated high sensitivity troponin I (hsTnI) values and significant  changes across serial measurements may suggest ACS but many other  chronic and acute conditions are known to elevate hsTnI results.  Refer to the "Links" section for chest pain algorithms and additional  guidance. Performed at [facility], [address] [place], [place] [address]   Magnesium     Status: None   Collection Time: 10/16/21  8:22 PM  Result Value Ref Range   Magnesium 2.0 1.7 - 2.4 mg/dL    Comment: Performed at [facility], [address] [place],  [address]  Procalcitonin - Baseline     Status: None   Collection Time: 10/16/21  8:22 PM  Result Value Ref Range   Procalcitonin <0.10 ng/mL    Comment:        Interpretation: PCT (Procalcitonin) <= 0.5 ng/mL: Systemic infection (sepsis) is not likely. Local bacterial infection is possible. (NOTE)       Sepsis PCT Algorithm           Lower Respiratory Tract                                      Infection PCT Algorithm     ----------------------------     ----------------------------         PCT <  0.25 ng/mL                PCT < 0.10 ng/mL          Strongly encourage             Strongly discourage   discontinuation of antibiotics    initiation of antibiotics    ----------------------------     -----------------------------       PCT 0.25 - 0.50 ng/mL            PCT 0.10 - 0.25 ng/mL               OR       >80% decrease in PCT            Discourage initiation of                                            antibiotics      Encourage discontinuation           of antibiotics    ----------------------------     -----------------------------         PCT >= 0.50 ng/mL              PCT 0.26 - 0.50 ng/mL               AND        <80% decrease in PCT             Encourage initiation of                                             antibiotics       Encourage continuation           of antibiotics    ----------------------------     -----------------------------        PCT >= 0.50 ng/mL                  PCT > 0.50 ng/mL               AND         increase in PCT                  Strongly encourage                                      initiation of antibiotics    Strongly encourage escalation           of antibiotics                                     -----------------------------                                           PCT <= 0.25 ng/mL  OR                                        > 80% decrease in PCT                                      Discontinue / Do not initiate                                             antibiotics  Performed at [facility], [address] [place], [place] [address]   APTT     Status: None   Collection Time: 10/16/21  8:22 PM  Result Value Ref Range   aPTT 32 24 - 36 seconds    Comment: Performed at [facility], [address] [place], [place] [address]  Protime-INR     Status: None   Collection Time: 10/16/21   8:22 PM  Result Value Ref Range   Prothrombin Time 13.6 11.4 - 15.2 seconds   INR 1.0 0.8 - 1.2    Comment: (NOTE) INR goal varies based on device and disease states. Performed at [facility], [address] [place], [place] [address]   Resp Panel by RT-PCR (Flu A&B, Covid) Nasopharyngeal Swab     Status: None   Collection Time: 10/16/21  8:39 PM   Specimen: Nasopharyngeal Swab; Nasopharyngeal(NP) swabs in vial transport medium  Result Value Ref Range   SARS Coronavirus 2 by RT PCR NEGATIVE NEGATIVE    Comment: (NOTE) SARS-CoV-2 target nucleic acids are NOT DETECTED.  The SARS-CoV-2 RNA is generally detectable in upper respiratory specimens during the acute phase of infection. The lowest concentration of SARS-CoV-2 viral copies this assay can detect is 138 copies/mL. A negative result does not preclude SARS-Cov-2 infection and should not be used as the sole basis for treatment or other patient management decisions. A negative result may occur with  improper specimen collection/handling, submission of specimen other than nasopharyngeal swab, presence of viral mutation(s) within the areas targeted by this assay, and inadequate number of viral copies(<138 copies/mL). A negative result must be combined with clinical observations, patient history, and epidemiological information. The expected result is Negative.  Fact Sheet for Patients:  EntrepreneurPulse.com.au  Fact Sheet for Healthcare Providers:  IncredibleEmployment.be  This test is no t yet approved or cleared by the [place] FDA and  has been authorized for detection and/or diagnosis of SARS-CoV-2 by FDA under an Emergency Use Authorization (EUA). This EUA will remain  in effect (meaning this test can be used) for the duration of the COVID-19 declaration under Section 564(b)(1) of the Act, 21 U.S.C.section 360bbb-3(b)(1), unless the authorization is terminated  or  revoked sooner.       Influenza A by PCR NEGATIVE NEGATIVE   Influenza B by PCR NEGATIVE NEGATIVE    Comment: (NOTE) The Xpert Xpress SARS-CoV-2/FLU/RSV plus assay is intended as an aid in the diagnosis of influenza from Nasopharyngeal swab specimens and should not be used as a sole basis for treatment. Nasal washings and aspirates are unacceptable for Xpert Xpress SARS-CoV-2/FLU/RSV testing.  Fact Sheet for Patients: EntrepreneurPulse.com.au  Fact Sheet for Healthcare Providers: IncredibleEmployment.be  This test is not  yet approved or cleared by the [place] and has been authorized for detection and/or diagnosis of SARS-CoV-2 by FDA under an Emergency Use Authorization (EUA). This EUA will remain in effect (meaning this test can be used) for the duration of the COVID-19 declaration under Section 564(b)(1) of the Act, 21 U.S.C. section 360bbb-3(b)(1), unless the authorization is terminated or revoked.  Performed at [facility], [address] [place], [place] [address]   Lactic acid, plasma     Status: Abnormal   Collection Time: 10/16/21  9:02 PM  Result Value Ref Range   Lactic Acid, Venous 2.5 (HH) 0.5 - 1.9 mmol/L    Comment: CRITICAL RESULT CALLED TO, READ BACK BY AND VERIFIED WITH ALYCIA BEVERLY AT 2336 10/16/2021 DLB Performed at [facility], [address] [place], [place] [address]   MRSA Next Gen by PCR, Nasal     Status: None   Collection Time: 10/16/21  9:39 PM   Specimen: Nasopharyngeal Swab; Nasal Swab  Result Value Ref Range   MRSA by PCR Next Gen NOT DETECTED NOT DETECTED    Comment: (NOTE) The GeneXpert MRSA Assay (FDA approved for NASAL specimens only), is one component of a comprehensive MRSA colonization surveillance program. It is not intended to diagnose MRSA infection nor to guide or monitor treatment for MRSA infections. Test performance is not FDA approved in patients less  than 62 years old. Performed at [facility], [place], [place] [address]   Troponin I (High Sensitivity)     Status: None   Collection Time: 10/16/21 10:27 PM  Result Value Ref Range   Troponin I (High Sensitivity) 3 <18 ng/L    Comment: (NOTE) Elevated high sensitivity troponin I (hsTnI) values and significant  changes across serial measurements may suggest ACS but many other  chronic and acute conditions are known to elevate hsTnI results.  Refer to the "Links" section for chest pain algorithms and additional  guidance. Performed at [facility], [address] [place], [place] [address]   Lactic acid, plasma     Status: Abnormal   Collection Time: 10/16/21 11:12 PM  Result Value Ref Range   Lactic Acid, Venous 2.0 (HH) 0.5 - 1.9 mmol/L    Comment: CRITICAL VALUE NOTED. VALUE IS CONSISTENT WITH PREVIOUSLY REPORTED/CALLED VALUE DLB Performed at [facility], [address] [place], [place] [address]   Urinalysis, Complete w Microscopic     Status: Abnormal   Collection Time: 10/17/21  1:22 AM  Result Value Ref Range   Color, Urine YELLOW (A) YELLOW   APPearance CLEAR (A) CLEAR   Specific Gravity, Urine 1.024 1.005 - 1.030   pH 6.0 5.0 - 8.0   Glucose, UA NEGATIVE NEGATIVE mg/dL   Hgb urine dipstick NEGATIVE NEGATIVE   Bilirubin Urine NEGATIVE NEGATIVE   Ketones, ur NEGATIVE NEGATIVE mg/dL   Protein, ur NEGATIVE NEGATIVE mg/dL   Nitrite NEGATIVE NEGATIVE   Leukocytes,Ua NEGATIVE NEGATIVE   RBC / HPF 0-5 0 - 5 RBC/hpf   WBC, UA 0-5 0 - 5 WBC/hpf   Bacteria, UA NONE SEEN NONE SEEN   Squamous Epithelial / LPF NONE SEEN 0 - 5   Mucus PRESENT    Hyaline Casts, UA PRESENT     Comment: Performed at [facility], [address], [place], [place] [address]  Basic metabolic panel     Status: Abnormal   Collection Time: 10/17/21  5:27 AM  Result Value Ref Range  Sodium 135 135 - 145 mmol/L   Potassium 4.0 3.5 - 5.1  mmol/L   Chloride 105 98 - 111 mmol/L   CO2 24 22 - 32 mmol/L   Glucose, Bld 126 (H) 70 - 99 mg/dL    Comment: Glucose reference range applies only to samples taken after fasting for at least 8 hours.   BUN 9 8 - 23 mg/dL   Creatinine, Ser 0.53 (L) 0.61 - 1.24 mg/dL   Calcium 8.9 8.9 - 10.3 mg/dL   GFR, Estimated >60 >60 mL/min    Comment: (NOTE) Calculated using the CKD-EPI Creatinine Equation (2021)    Anion gap 6 5 - 15    Comment: Performed at [facility], [address] [place], [place] [address]  CBC     Status: Abnormal   Collection Time: 10/17/21  5:27 AM  Result Value Ref Range   WBC 17.3 (H) 4.0 - 10.5 K/uL   RBC 4.30 4.22 - 5.81 MIL/uL   Hemoglobin 12.7 (L) 13.0 - 17.0 g/dL   HCT 38.4 (L) 39.0 - 52.0 %   MCV 89.3 80.0 - 100.0 fL   MCH 29.5 26.0 - 34.0 pg   MCHC 33.1 30.0 - 36.0 g/dL   RDW 14.6 11.5 - 15.5 %   Platelets 344 150 - 400 K/uL   nRBC 0.0 0.0 - 0.2 %    Comment: Performed at [facility], [place], [place] [address]  Heparin level (unfractionated)     Status: Abnormal   Collection Time: 10/17/21  5:27 AM  Result Value Ref Range   Heparin Unfractionated 0.11 (L) 0.30 - 0.70 IU/mL    Comment: (NOTE) The clinical reportable range upper limit is being lowered to >1.10 to align with the FDA approved guidance for the current laboratory assay.  If heparin results are below expected values, and patient dosage has  been confirmed, suggest follow up testing of antithrombin III levels. Performed at [facility], [place], [place] [address]     CT Angio Chest PE W/Cm &/Or Wo Cm  Result Date: 10/16/2021 CLINICAL DATA:  Tachycardia, dyspnea, midsternal chest pain EXAM: CT ANGIOGRAPHY CHEST WITH CONTRAST TECHNIQUE: Multidetector CT imaging of the chest was performed using the standard protocol during bolus administration of intravenous contrast. Multiplanar CT image reconstructions and MIPs were  obtained to evaluate the vascular anatomy. CONTRAST:  83m OMNIPAQUE IOHEXOL 350 MG/ML SOLN COMPARISON:  10/02/2021 FINDINGS: Cardiovascular: There is adequate opacification of the pulmonary arterial tree. And intraluminal filling defect is now identified within the posterior basal segmental pulmonary artery of the right lower lobe in keeping with an acute pulmonary embolus. The overall embolic burden is small. The central pulmonary arteries are of normal caliber. There is, however, CT evidence of right heart strain with inversion of the normal RV/LV ratio. The RV/LV ratio is 1.1. No significant coronary artery calcification. Global cardiac size is within normal limits. Moderate pericardial effusion is present, slightly decreased in size since prior examination. Thoracic aorta is unremarkable. Mediastinum/Nodes: Pathologic bilateral axillary adenopathy is stable. Thyroid unremarkable. Esophagus is unremarkable. Lungs/Pleura: Right pleural effusion has resolved. Left pleural effusion has decreased in size. Persistent subtotal collapse of the left lower lobe. No superimposed focal pulmonary infiltrate or nodule. Central airways are widely patent. Upper Abdomen: No acute abnormality. Musculoskeletal: No chest wall abnormality. No acute or significant osseous findings. Review of the MIP images confirms the above findings. IMPRESSION: Acute pulmonary embolism. The embolic burden is small. While  there is CT evidence of right heart strain (RV/LV ratio 1.1) this appears unchanged from prior CT examination of 10/02/2021 and, as such, right heart strain may not be attributable to the acute pulmonary embolism but may, in fact, reflect a chronic underlying abnormality. Mild interval decrease in size of pericardial effusion. No CT evidence of cardiac tamponade. Interval resolution of right pleural effusion and decrease of left pleural effusion. Persistent subtotal left lower lobe collapse. Stable pathologic bilateral axillary  lymphadenopathy, compatible with an underlying lymphoproliferative process such as chronic lymphocytic leukemia. These results were called by telephone at the time of interpretation on 10/16/2021 at 10:43 pm to provider [facility] , who verbally acknowledged these results. Electronically Signed   By: Fidela Salisbury M.D.   On: 10/16/2021 22:46   US Venous Img Lower Bilateral (DVT)  Result Date: 10/17/2021 CLINICAL DATA:  Lower extremity pain and known pulmonary emboli EXAM: BILATERAL LOWER EXTREMITY VENOUS DOPPLER ULTRASOUND TECHNIQUE: Gray-scale sonography with graded compression, as well as color Doppler and duplex ultrasound were performed to evaluate the lower extremity deep venous systems from the level of the common femoral vein and including the common femoral, femoral, profunda femoral, popliteal and calf veins including the posterior tibial, peroneal and gastrocnemius veins when visible. The superficial great saphenous vein was also interrogated. Spectral Doppler was utilized to evaluate flow at rest and with distal augmentation maneuvers in the common femoral, femoral and popliteal veins. COMPARISON:  None. FINDINGS: RIGHT LOWER EXTREMITY Common Femoral Vein: No evidence of thrombus. Normal compressibility, respiratory phasicity and response to augmentation. Saphenofemoral Junction: No evidence of thrombus. Normal compressibility and flow on color Doppler imaging. Profunda Femoral Vein: No evidence of thrombus. Normal compressibility and flow on color Doppler imaging. Femoral Vein: No evidence of thrombus. Normal compressibility, respiratory phasicity and response to augmentation. Popliteal Vein: No evidence of thrombus. Normal compressibility, respiratory phasicity and response to augmentation. Calf Veins: No evidence of thrombus. Normal compressibility and flow on color Doppler imaging. Superficial Great Saphenous Vein: No evidence of thrombus. Normal compressibility. Venous Reflux:  None. Other  Findings:  None. LEFT LOWER EXTREMITY Common Femoral Vein: No evidence of thrombus. Normal compressibility, respiratory phasicity and response to augmentation. Saphenofemoral Junction: No evidence of thrombus. Normal compressibility and flow on color Doppler imaging. Profunda Femoral Vein: No evidence of thrombus. Normal compressibility and flow on color Doppler imaging. Femoral Vein: No evidence of thrombus. Normal compressibility, respiratory phasicity and response to augmentation. Popliteal Vein: No evidence of thrombus. Normal compressibility, respiratory phasicity and response to augmentation. Calf Veins: No evidence of thrombus. Normal compressibility and flow on color Doppler imaging. Superficial Great Saphenous Vein: No evidence of thrombus. Normal compressibility. Venous Reflux:  None. Other Findings:  None. IMPRESSION: No evidence of deep venous thrombosis in either lower extremity. Electronically Signed   By: Inez Catalina M.D.   On: 10/17/2021 00:31   DG Chest Port 1 View  Result Date: 10/16/2021 CLINICAL DATA:  Substernal chest pain. EXAM: PORTABLE CHEST 1 VIEW COMPARISON:  October 02, 2021 FINDINGS: Stable Port-A-Cath. The heart, hila, mediastinum, lungs, and pleura are otherwise unremarkable. IMPRESSION: No active disease. Electronically Signed   By: Dorise Bullion III M.D.   On: 10/16/2021 21:06    Review of Systems  All other systems reviewed and are negative. Blood pressure 105/78, pulse 100, temperature 98.6 F (37 C), temperature source Oral, resp. rate (!) 24, SpO2 95 %. Physical Exam Vitals and nursing note reviewed.  Constitutional:      Appearance: He is  well-developed and normal weight.  HENT:     Head: Normocephalic and atraumatic.  Eyes:     Extraocular Movements: Extraocular movements intact.     Pupils: Pupils are equal, round, and reactive to light.  Cardiovascular:     Rate and Rhythm: Regular rhythm.     Heart sounds: Normal heart sounds.  Pulmonary:      Effort: Tachypnea present.     Breath sounds: Examination of the right-lower field reveals decreased breath sounds. Examination of the left-lower field reveals decreased breath sounds. Decreased breath sounds present.  Abdominal:     General: Bowel sounds are normal.     Palpations: Abdomen is soft.  Musculoskeletal:        General: Normal range of motion.     Cervical back: Normal range of motion and neck supple.  Skin:    General: Skin is warm and dry.  Neurological:     General: No focal deficit present.     Mental Status: He is alert and oriented to person, place, and time.  Psychiatric:        Mood and Affect: Mood normal.        Behavior: Behavior normal.    Assessment/Plan: Patient with submassive PE and right heart strain and tachypnea.  CT scan reviewed in detail. Patient with other medical issues from CLL treatment. Recovering from pneumonia and pericardiocentesis. Recommend continuing with heparin gtt for now. Keep him NPO for tentative catheterization and mechanical pulmonary thrombectomy. He will also need cardiology evaluation for pericardial effusion. Elmore Guise 10/17/2021, 8:02 AM
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
## 2021-10-18 ENCOUNTER — Telehealth: Payer: Self-pay | Admitting: Internal Medicine

## 2021-10-18 ENCOUNTER — Inpatient Hospital Stay: Payer: Medicare PPO

## 2021-10-18 ENCOUNTER — Encounter: Payer: Self-pay | Admitting: Internal Medicine

## 2021-10-18 ENCOUNTER — Inpatient Hospital Stay: Payer: Medicare PPO | Admitting: Internal Medicine

## 2021-10-18 ENCOUNTER — Other Ambulatory Visit (HOSPITAL_COMMUNITY): Payer: Self-pay

## 2021-10-18 ENCOUNTER — Telehealth: Payer: Self-pay | Admitting: *Deleted

## 2021-10-18 DIAGNOSIS — I3139 Other pericardial effusion (noninflammatory): Secondary | ICD-10-CM

## 2021-10-18 DIAGNOSIS — C9110 Chronic lymphocytic leukemia of B-cell type not having achieved remission: Secondary | ICD-10-CM | POA: Diagnosis not present

## 2021-10-18 DIAGNOSIS — I2699 Other pulmonary embolism without acute cor pulmonale: Secondary | ICD-10-CM | POA: Diagnosis not present

## 2021-10-18 LAB — HEPARIN LEVEL (UNFRACTIONATED)
Heparin Unfractionated: 0.45 IU/mL (ref 0.30–0.70)
Heparin Unfractionated: 0.41 IU/mL (ref 0.30–0.70)

## 2021-10-18 LAB — CBC
HCT: 38.4 % — ABNORMAL LOW (ref 39.0–52.0)
Hemoglobin: 12.9 g/dL — ABNORMAL LOW (ref 13.0–17.0)
MCH: 29.4 pg (ref 26.0–34.0)
MCHC: 33.6 g/dL (ref 30.0–36.0)
MCV: 87.5 fL (ref 80.0–100.0)
Platelets: 289 10*3/uL (ref 150–400)
RBC: 4.39 MIL/uL (ref 4.22–5.81)
RDW: 14.5 % (ref 11.5–15.5)
WBC: 12.4 10*3/uL — ABNORMAL HIGH (ref 4.0–10.5)
nRBC: 0 % (ref 0.0–0.2)

## 2021-10-18 MED ORDER — OXYCODONE HCL 5 MG PO TABS: 5.0000 mg | ORAL_TABLET | Freq: Four times a day (QID) | ORAL | 0 refills | Status: DC | PRN

## 2021-10-18 MED ORDER — COLCHICINE 0.6 MG PO TABS
0.6000 mg | ORAL_TABLET | Freq: Two times a day (BID) | ORAL | 0 refills | Status: DC
Start: 2021-10-18 — End: 2021-10-27

## 2021-10-18 MED ORDER — HEPARIN SOD (PORK) LOCK FLUSH 100 UNIT/ML IV SOLN
500.0000 [IU] | INTRAVENOUS | Status: DC
  Filled 2021-10-18: qty 5

## 2021-10-18 MED ORDER — PREDNISONE 10 MG PO TABS: ORAL_TABLET | ORAL | 0 refills | Status: DC

## 2021-10-18 MED ORDER — SODIUM CHLORIDE 0.9% FLUSH: 10.0000 mL | INTRAVENOUS | Status: DC | PRN

## 2021-10-18 MED ORDER — BENZONATATE 200 MG PO CAPS: 200.0000 mg | ORAL_CAPSULE | Freq: Two times a day (BID) | ORAL | 0 refills | Status: DC | PRN

## 2021-10-18 MED ORDER — HEPARIN SOD (PORK) LOCK FLUSH 100 UNIT/ML IV SOLN
500.0000 [IU] | INTRAVENOUS | Status: DC | PRN
  Administered 2021-10-18: 500 [IU]
  Filled 2021-10-18 (×2): qty 5

## 2021-10-18 MED ORDER — APIXABAN (ELIQUIS) VTE STARTER PACK (10MG AND 5MG): ORAL_TABLET | ORAL | 0 refills | Status: DC

## 2021-10-18 MED ORDER — SODIUM CHLORIDE 0.9% FLUSH: 10.0000 mL | Freq: Two times a day (BID) | INTRAVENOUS | Status: DC

## 2021-10-18 MED ORDER — APIXABAN 5 MG PO TABS: 5.0000 mg | ORAL_TABLET | Freq: Two times a day (BID) | ORAL | Status: DC

## 2021-10-18 MED ORDER — APIXABAN 5 MG PO TABS
10.0000 mg | ORAL_TABLET | Freq: Two times a day (BID) | ORAL | Status: DC
  Administered 2021-10-18: 10 mg via ORAL
  Filled 2021-10-18: qty 2

## 2021-10-18 NOTE — Telephone Encounter (Signed)
Attempted to schedule.  LMOV to call office.  ° °

## 2021-10-18 NOTE — Progress Notes (Signed)
Progress Note  Patient Name: Andres Mullins Date of Encounter: 10/18/2021  [facility] Cardiologist: [name], MD   Subjective   Andres Mullins feels much better, essentially back to normal.  Chest pain has resolved and breathing is back to baseline.  He denies palpitations, lightheadedness, and edema.  Inpatient Medications    Scheduled Meds:  allopurinol  300 mg Oral BID   apixaban  10 mg Oral BID   Followed by   Derrill Memo ON 10/25/2021] apixaban  5 mg Oral BID   Chlorhexidine Gluconate Cloth  6 each Topical Daily   colchicine  0.6 mg Oral Daily   methylPREDNISolone (SOLU-MEDROL) injection  125 mg Intravenous Daily   sodium chloride flush  10-40 mL Intracatheter Q12H   Continuous Infusions:  PRN Meds: acetaminophen **OR** acetaminophen, benzonatate, chlorpheniramine-HYDROcodone, morphine injection, ondansetron **OR** ondansetron (ZOFRAN) IV, oxyCODONE, sodium chloride flush   Vital Signs    Vitals:   10/18/21 0111 10/18/21 0355 10/18/21 0745 10/18/21 1115  BP: 102/74 101/79 113/76 114/81  Pulse: 98 99 (!) 103 100  Resp: 16 16 16 16   Temp: 97.9 F (36.6 C) 98 F (36.7 C) 97.6 F (36.4 C) 97.7 F (36.5 C)  TempSrc:      SpO2: 92% 93% 93% 97%  Weight:      Height:        Intake/Output Summary (Last 24 hours) at 10/18/2021 1414 Last data filed at 10/18/2021 1330 Gross per 24 hour  Intake 2027.01 ml  Output --  Net 2027.01 ml   Last 3 Weights 10/17/2021 10/10/2021 10/05/2021  Weight (lbs) 161 lb 11.2 oz 154 lb 7 oz 156 lb 4.8 oz  Weight (kg) 73.347 kg 70.052 kg 70.897 kg      Telemetry    Sinus rhythm (rates 90-130 bpm) - Personally Reviewed  ECG    No new tracing. Physical Exam   GEN: No acute distress.   Neck: No JVD Cardiac: RRR, no murmurs, rubs, or gallops.  Respiratory: Clear to auscultation bilaterally. GI: Soft, nontender, non-distended  MS: No edema; No deformity. Neuro:  Nonfocal  Psych: Normal affect   Labs    High  Sensitivity Troponin:   Recent Labs  Lab 10/02/21 2125 10/02/21 2322 10/16/21 2022 10/16/21 2227  TROPONINIHS 5 6 3 3      Chemistry Recent Labs  Lab 10/16/21 2022 10/17/21 0527  NA 137 135  K 3.9 4.0  CL 102 105  CO2 27 24  GLUCOSE 139* 126*  BUN 11 9  CREATININE 0.64 0.53*  CALCIUM 9.9 8.9  MG 2.0  --   GFRNONAA >60 >60  ANIONGAP 8 6    Lipids No results for input(s): CHOL, TRIG, HDL, LABVLDL, LDLCALC, CHOLHDL in the last 168 hours.  Hematology Recent Labs  Lab 10/16/21 2022 10/17/21 0527 10/18/21 0319  WBC 26.4* 17.3* 12.4*  RBC 4.96 4.30 4.39  HGB 14.9 12.7* 12.9*  HCT 44.5 38.4* 38.4*  MCV 89.7 89.3 87.5  MCH 30.0 29.5 29.4  MCHC 33.5 33.1 33.6  RDW 14.5 14.6 14.5  PLT 420* 344 289   Thyroid No results for input(s): TSH, FREET4 in the last 168 hours.  BNPNo results for input(s): BNP, PROBNP in the last 168 hours.  DDimer No results for input(s): DDIMER in the last 168 hours.   Radiology    CT Angio Chest PE W/Cm &/Or Wo Cm  Result Date: 10/16/2021 CLINICAL DATA:  Tachycardia, dyspnea, midsternal chest pain EXAM: CT ANGIOGRAPHY CHEST WITH CONTRAST TECHNIQUE: Multidetector CT  imaging of the chest was performed using the standard protocol during bolus administration of intravenous contrast. Multiplanar CT image reconstructions and MIPs were obtained to evaluate the vascular anatomy. CONTRAST:  29m OMNIPAQUE IOHEXOL 350 MG/ML SOLN COMPARISON:  10/02/2021 FINDINGS: Cardiovascular: There is adequate opacification of the pulmonary arterial tree. And intraluminal filling defect is now identified within the posterior basal segmental pulmonary artery of the right lower lobe in keeping with an acute pulmonary embolus. The overall embolic burden is small. The central pulmonary arteries are of normal caliber. There is, however, CT evidence of right heart strain with inversion of the normal RV/LV ratio. The RV/LV ratio is 1.1. No significant coronary artery calcification.  Global cardiac size is within normal limits. Moderate pericardial effusion is present, slightly decreased in size since prior examination. Thoracic aorta is unremarkable. Mediastinum/Nodes: Pathologic bilateral axillary adenopathy is stable. Thyroid unremarkable. Esophagus is unremarkable. Lungs/Pleura: Right pleural effusion has resolved. Left pleural effusion has decreased in size. Persistent subtotal collapse of the left lower lobe. No superimposed focal pulmonary infiltrate or nodule. Central airways are widely patent. Upper Abdomen: No acute abnormality. Musculoskeletal: No chest wall abnormality. No acute or significant osseous findings. Review of the MIP images confirms the above findings. IMPRESSION: Acute pulmonary embolism. The embolic burden is small. While there is CT evidence of right heart strain (RV/LV ratio 1.1) this appears unchanged from prior CT examination of 10/02/2021 and, as such, right heart strain may not be attributable to the acute pulmonary embolism but may, in fact, reflect a chronic underlying abnormality. Mild interval decrease in size of pericardial effusion. No CT evidence of cardiac tamponade. Interval resolution of right pleural effusion and decrease of left pleural effusion. Persistent subtotal left lower lobe collapse. Stable pathologic bilateral axillary lymphadenopathy, compatible with an underlying lymphoproliferative process such as chronic lymphocytic leukemia. These results were called by telephone at the time of interpretation on 10/16/2021 at 10:43 pm to [facility] who verbally acknowledged these results. Electronically Signed   By: AFidela SalisburyM.D.   On: 10/16/2021 22:46   UKoreaVenous Img Lower Bilateral (DVT)  Result Date: 10/17/2021 CLINICAL DATA:  Lower extremity pain and known pulmonary emboli EXAM: BILATERAL LOWER EXTREMITY VENOUS DOPPLER ULTRASOUND TECHNIQUE: Gray-scale sonography with graded compression, as well as color Doppler and duplex  ultrasound were performed to evaluate the lower extremity deep venous systems from the level of the common femoral vein and including the common femoral, femoral, profunda femoral, popliteal and calf veins including the posterior tibial, peroneal and gastrocnemius veins when visible. The superficial great saphenous vein was also interrogated. Spectral Doppler was utilized to evaluate flow at rest and with distal augmentation maneuvers in the common femoral, femoral and popliteal veins. COMPARISON:  None. FINDINGS: RIGHT LOWER EXTREMITY Common Femoral Vein: No evidence of thrombus. Normal compressibility, respiratory phasicity and response to augmentation. Saphenofemoral Junction: No evidence of thrombus. Normal compressibility and flow on color Doppler imaging. Profunda Femoral Vein: No evidence of thrombus. Normal compressibility and flow on color Doppler imaging. Femoral Vein: No evidence of thrombus. Normal compressibility, respiratory phasicity and response to augmentation. Popliteal Vein: No evidence of thrombus. Normal compressibility, respiratory phasicity and response to augmentation. Calf Veins: No evidence of thrombus. Normal compressibility and flow on color Doppler imaging. Superficial Great Saphenous Vein: No evidence of thrombus. Normal compressibility. Venous Reflux:  None. Other Findings:  None. LEFT LOWER EXTREMITY Common Femoral Vein: No evidence of thrombus. Normal compressibility, respiratory phasicity and response to augmentation. Saphenofemoral Junction: No evidence  of thrombus. Normal compressibility and flow on color Doppler imaging. Profunda Femoral Vein: No evidence of thrombus. Normal compressibility and flow on color Doppler imaging. Femoral Vein: No evidence of thrombus. Normal compressibility, respiratory phasicity and response to augmentation. Popliteal Vein: No evidence of thrombus. Normal compressibility, respiratory phasicity and response to augmentation. Calf Veins: No evidence of  thrombus. Normal compressibility and flow on color Doppler imaging. Superficial Great Saphenous Vein: No evidence of thrombus. Normal compressibility. Venous Reflux:  None. Other Findings:  None. IMPRESSION: No evidence of deep venous thrombosis in either lower extremity. Electronically Signed   By: Inez Catalina M.D.   On: 10/17/2021 00:31   DG Chest Port 1 View  Result Date: 10/16/2021 CLINICAL DATA:  Substernal chest pain. EXAM: PORTABLE CHEST 1 VIEW COMPARISON:  October 02, 2021 FINDINGS: Stable Port-A-Cath. The heart, hila, mediastinum, lungs, and pleura are otherwise unremarkable. IMPRESSION: No active disease. Electronically Signed   By: Dorise Bullion III M.D.   On: 10/16/2021 21:06   ECHOCARDIOGRAM COMPLETE  Result Date: 10/17/2021    ECHOCARDIOGRAM REPORT   Patient Name:   Andres Mullins Date of Exam: 10/17/2021 Medical Rec #:  [id number]       Height:       66.0 in Accession #:    [id number]      Weight:       154.4 lb Date of Birth:  03/25/1951       BSA:          1.792 m Patient Age:    70 years        BP:           105/78 mmHg Patient Gender: M               HR:           97 bpm. Exam Location:  [facility] Procedure: 2D Echo, Color Doppler and Cardiac Doppler Indications:     I26.09 Pulmonary Embolus  History:         Patient has prior history of Echocardiogram examinations, most                  recent 10/04/2021. Risk Factors:Dyslipidemia and Current Smoker.  Sonographer:     Charmayne Sheer Referring Phys:  [id number] AMY N COX Diagnosing Phys: Kathlyn Sacramento MD IMPRESSIONS  1. Left ventricular ejection fraction, by estimation, is 55 to 60%. The left ventricle has normal function. The left ventricle has no regional wall motion abnormalities. Left ventricular diastolic parameters were normal.  2. Right ventricular systolic function is normal. The right ventricular size is normal.  3. A small pericardial effusion is present. The pericardial effusion is circumferential.  4. The mitral valve is normal in  structure. No evidence of mitral valve regurgitation. No evidence of mitral stenosis.  5. The aortic valve is normal in structure. Aortic valve regurgitation is not visualized. No aortic stenosis is present.  6. Aortic dilatation noted. There is mild dilatation of the aortic root, measuring 40 mm.  7. The inferior vena cava is normal in size with greater than 50% respiratory variability, suggesting right atrial pressure of 3 mmHg. FINDINGS  Left Ventricle: Left ventricular ejection fraction, by estimation, is 55 to 60%. The left ventricle has normal function. The left ventricle has no regional wall motion abnormalities. The left ventricular internal cavity size was normal in size. There is  no left ventricular hypertrophy. Left ventricular diastolic parameters were normal. Right Ventricle: The right ventricular size is normal. No  increase in right ventricular wall thickness. Right ventricular systolic function is normal. Left Atrium: Left atrial size was normal in size. Right Atrium: Right atrial size was normal in size. Pericardium: A small pericardial effusion is present. The pericardial effusion is circumferential. Mitral Valve: The mitral valve is normal in structure. No evidence of mitral valve regurgitation. No evidence of mitral valve stenosis. MV peak gradient, 2.3 mmHg. The mean mitral valve gradient is 1.0 mmHg. Tricuspid Valve: The tricuspid valve is normal in structure. Tricuspid valve regurgitation is trivial. No evidence of tricuspid stenosis. Aortic Valve: The aortic valve is normal in structure. Aortic valve regurgitation is not visualized. No aortic stenosis is present. Aortic valve mean gradient measures 3.0 mmHg. Aortic valve peak gradient measures 6.1 mmHg. Aortic valve area, by VTI measures 2.92 cm. Pulmonic Valve: The pulmonic valve was normal in structure. Pulmonic valve regurgitation is mild. No evidence of pulmonic stenosis. Aorta: Aortic dilatation noted. There is mild dilatation of the  aortic root, measuring 40 mm. Venous: The inferior vena cava is normal in size with greater than 50% respiratory variability, suggesting right atrial pressure of 3 mmHg. IAS/Shunts: No atrial level shunt detected by color flow Doppler.  LEFT VENTRICLE PLAX 2D LVIDd:         4.56 cm   Diastology LVIDs:         2.64 cm   LV e' medial:    10.90 cm/s LV PW:         1.02 cm   LV E/e' medial:  7.2 LV IVS:        0.75 cm   LV e' lateral:   13.30 cm/s LVOT diam:     2.10 cm   LV E/e' lateral: 5.9 LV SV:         50 LV SV Index:   28 LVOT Area:     3.46 cm  RIGHT VENTRICLE RV Basal diam:  2.85 cm LEFT ATRIUM             Index LA diam:        3.60 cm 2.01 cm/m LA Vol (A2C):   35.7 ml 19.93 ml/m LA Vol (A4C):   24.1 ml 13.45 ml/m LA Biplane Vol: 29.5 ml 16.46 ml/m  AORTIC VALVE                    PULMONIC VALVE AV Area (Vmax):    3.04 cm     PV Vmax:       1.04 m/s AV Area (Vmean):   2.84 cm     PV Vmean:      67.400 cm/s AV Area (VTI):     2.92 cm     PV VTI:        0.167 m AV Vmax:           123.00 cm/s  PV Peak grad:  4.3 mmHg AV Vmean:          83.200 cm/s  PV Mean grad:  2.0 mmHg AV VTI:            0.171 m AV Peak Grad:      6.1 mmHg AV Mean Grad:      3.0 mmHg LVOT Vmax:         108.00 cm/s LVOT Vmean:        68.200 cm/s LVOT VTI:          0.144 m LVOT/AV VTI ratio: 0.84  AORTA Ao Root diam: 4.00 cm MITRAL VALVE MV  Area (PHT): 4.60 cm    SHUNTS MV Area VTI:   2.93 cm    Systemic VTI:  0.14 m MV Peak grad:  2.3 mmHg    Systemic Diam: 2.10 cm MV Mean grad:  1.0 mmHg MV Vmax:       0.75 m/s MV Vmean:      55.6 cm/s MV Decel Time: 165 msec MV E velocity: 78.80 cm/s MV A velocity: 70.30 cm/s MV E/A ratio:  1.12 Kathlyn Sacramento MD Electronically signed by Kathlyn Sacramento MD Signature Date/Time: 10/17/2021/2:28:57 PM    Final     Cardiac Studies   See TTE above.  Patient Profile     70 y.o. male with history of  CLL, HLD, atrial tachycardia, aortic and coronary calcium by prior lung CA screening CT, ongoing  tobacco abuse, celiac disease, Vitamin D deficiency, psoriasis, recent admission for pericardial effusion s/p pericardiocentesis, admitted with recurrent chest pain and shortness of breath with newly diagnosed PE and recurrent pericardial effusion.  Assessment & Plan    Pericardial effusion: Noted to be small on echocardiogram.  Question recurrent pericarditis.  Symptoms have improved considerably with addition of steroids.  Effusion is small on yesterday's echo without indication for repeat pericardiocentesis. - Transition SoluMedrol to prednisone 50 mg PO daily x 2 weeks, followed by gentle taper with weekly reduction in daily dosing by 10 mg. - Increase colchicine to 0.6 mg BID (as tolerated). - Add PPI in the setting of extended course of steroids with ongoing anticoagulation to limit risk for PUD/gastritis/GI bleed.  PE: Evaluated by hematology and vascular surgery; clot burden relatively small.  Mechanical thrombectomy not indication. - Transition from heparin to DOAC.  CLL - Per Onc.  [facility] will sign off.   Medication Recommendations:  See above. Other recommendations (labs, testing, etc):  Limited echo at or shortly after f/u visit in our office scheduled for 10/27/2021. Follow up as an outpatient:  Scheduled to see Christell Faith, PA, on 10/27/2021.  For questions or updates, please contact [place] Please consult [email or web address] for contact info under        Signed, [name], MD  10/18/2021, 2:14 PM

## 2021-10-18 NOTE — Progress Notes (Signed)
[facility] Daily Progress Note   Subjective: Patient is without complaint this AM.  No acute issues overnight.  Notes that his shortness of breath and chest pain have improved even with ambulation.  Objective: Vitals:   10/18/21 0111 10/18/21 0355 10/18/21 0745 10/18/21 1115  BP: 102/74 101/79 113/76 114/81  Pulse: 98 99 (!) 103 100  Resp: 16 16 16 16   Temp: 97.9 F (36.6 C) 98 F (36.7 C) 97.6 F (36.4 C) 97.7 F (36.5 C)  TempSrc:      SpO2: 92% 93% 93% 97%  Weight:      Height:        Intake/Output Summary (Last 24 hours) at 10/18/2021 1130 Last data filed at 10/18/2021 1000 Gross per 24 hour  Intake 1787.01 ml  Output --  Net 1787.01 ml   Physical Exam: A&Ox3, NAD CV: RRR Pulmonary: CTA Bilaterally Abdomen: Soft, Nontender, Nondistended Vascular: Warm distally in toes   Laboratory: CBC    Component Value Date/Time   WBC 12.4 (H) 10/18/2021 0319   HGB 12.9 (L) 10/18/2021 0319   HGB 14.5 08/17/2015 1135   HCT 38.4 (L) 10/18/2021 0319   HCT 43.1 08/17/2015 1135   PLT 289 10/18/2021 0319   PLT 240 08/17/2015 1135   BMET    Component Value Date/Time   NA 135 10/17/2021 0527   K 4.0 10/17/2021 0527   CL 105 10/17/2021 0527   CO2 24 10/17/2021 0527   GLUCOSE 126 (H) 10/17/2021 0527   BUN 9 10/17/2021 0527   CREATININE 0.53 (L) 10/17/2021 0527   CREATININE 0.84 10/06/2019 0000   CALCIUM 8.9 10/17/2021 0527   GFRNONAA >60 10/17/2021 0527   GFRNONAA 91 10/06/2019 0000   GFRAA >60 12/26/2019 0842   GFRAA 106 10/06/2019 0000   Assessment/Planning: The patient is a 70 year old male who presented with a small clot burden PE  1) patient states that his symptoms have improved.  He denies any shortness of breath or chest pain at rest or ambulation. 2) patient is currently on heparin which will be transitioned to p.o. anticoagulation.  We discussed Eliquis however the patient would like to use an older oral anticoagulation.  We discussed  the possibility of starting Xarelto. 3) we also talked about the 2-week window we have to revisit the idea of a thrombectomy.  He knows to call our office once he is discharged if he should experience any worrisome symptoms.  If his symptoms become acutely / severely worse he knows to return back to the emergency department. 4) no DVT found on venous duplex.  Discussed with Dr. Eber Hong Jaretzy Lhommedieu PA-C 10/18/2021 11:30 AM

## 2021-10-18 NOTE — Telephone Encounter (Signed)
-----   Message from [name], MD sent at 10/18/2021  1:32 PM EST ----- Regarding: Follow-up echo Hello,  Mr. Dolman is scheduled for follow-up with Thurmond Butts on 12/1.  Would it be possible to get a limited echo that day (or shortly thereafter) to reassess pericardial effusion?  Thanks.  Gerald Stabs

## 2021-10-18 NOTE — Telephone Encounter (Signed)
The patient is currently admitted.  Limited echo order placed- will forward to scheduling to arrange for echo.  Dx- pericardial effusion

## 2021-10-18 NOTE — Telephone Encounter (Signed)
Please have the patient follow-up with me in the week of December 5th-MD; CBC CMP LDH; Crp.  Thanks, GB

## 2021-10-18 NOTE — Progress Notes (Signed)
ANTICOAGULATION CONSULT NOTE  Pharmacy Consult for heparin infusion Indication: pulmonary embolus  Patient Measurements: Height: 5' 6"  (167.6 cm) Weight: 73.3 kg (161 lb 11.2 oz) IBW/kg (Calculated) : 63.8 Heparin Dosing Weight: 70.1 kg  Labs: Recent Labs    10/16/21 2022 10/16/21 2022 10/16/21 2227 10/17/21 0527 10/17/21 1334 10/17/21 2139 10/18/21 0319  HGB 14.9  --   --  12.7*  --   --  12.9*  HCT 44.5  --   --  38.4*  --   --  38.4*  PLT 420*  --   --  344  --   --  289  APTT 32  --   --   --   --   --   --   LABPROT 13.6  --   --   --   --   --   --   INR 1.0  --   --   --   --   --   --   HEPARINUNFRC  --    < >  --  0.11* 0.14* 0.12* 0.45  CREATININE 0.64  --   --  0.53*  --   --   --   TROPONINIHS 3  --  3  --   --   --   --    < > = values in this interval not displayed.     Estimated Creatinine Clearance: 79.8 mL/min (A) (by C-G formula based on SCr of 0.53 mg/dL (L)).   Medical History: Past Medical History:  Diagnosis Date   Atherosclerosis of coronary artery 09/28/2016   Chest CT Nov 2017   Atrial tachycardia (HCC)    CLL (chronic lymphocytic leukemia) (HCC)    Elevated C-reactive protein (CRP)    Elevated PSA    Fractured sternum 1999   Herpes    History of rib fracture 1999   multiple   Hyperlipidemia    Psoriasis    Rheumatoid factor positive 02/2014   Rosacea    Thoracic aortic atherosclerosis ([place]) 09/28/2016   Chest CT Nov 2017   Tobacco abuse    Vitamin D deficiency disease     Assessment: Pt is 70 yo male presenting with sudden onset of SOB and midsternum CP found with acute pulmonary embolism.  Goal of Therapy:  Heparin level 0.3-0.7 units/ml Monitor platelets by anticoagulation protocol: Yes  Date/time HL Comment 11/21 0527  0.11  Subtherapeutic; 1150u/hr 11/21 1334  0.14 Subtherapeutic; 1350u/hr 11/21 2139  0.12 Subtherapeutic; 1600u/hr 11/22 0319       0.45,  Therapeutic X 1, 1850 units/hr  Plan:  11/22:  HL @ 0319 =  0.45, therapeutic X 1 Will continue pt on current rate and draw confirmation level on 11/22 @ 0900.   Cassanda Walmer D 10/18/2021 6:49 AM

## 2021-10-18 NOTE — Progress Notes (Signed)
Andres Mullins   DOB:05-14-1951   KW#:[id number]    Subjective: No acute events overnight.  Patient continues on IV heparin.  Denies any significant worsening of the pain.  He is able to walk in the room without any significant shortness of breath or chest pain.  No worsening cough.  No bleeding.  Objective:  Vitals:   10/18/21 0745 10/18/21 1115  BP: 113/76 114/81  Pulse: (!) 103 100  Resp: 16 16  Temp: 97.6 F (36.4 C) 97.7 F (36.5 C)  SpO2: 93% 97%     Intake/Output Summary (Last 24 hours) at 10/18/2021 2048 Last data filed at 10/18/2021 1330 Gross per 24 hour  Intake 2027.01 ml  Output --  Net 2027.01 ml    Physical Exam Vitals and nursing note reviewed.  HENT:     Head: Normocephalic and atraumatic.     Mouth/Throat:     Pharynx: Oropharynx is clear.  Eyes:     Extraocular Movements: Extraocular movements intact.     Pupils: Pupils are equal, round, and reactive to light.  Cardiovascular:     Rate and Rhythm: Normal rate and regular rhythm.  Pulmonary:     Comments: Decreased breath sounds bilaterally.  Abdominal:     Palpations: Abdomen is soft.  Musculoskeletal:        General: Normal range of motion.     Cervical back: Normal range of motion.  Skin:    General: Skin is warm.  Neurological:     General: No focal deficit present.     Mental Status: He is alert and oriented to person, place, and time.  Psychiatric:        Behavior: Behavior normal.        Judgment: Judgment normal.     Labs:  Lab Results  Component Value Date   WBC 12.4 (H) 10/18/2021   HGB 12.9 (L) 10/18/2021   HCT 38.4 (L) 10/18/2021   MCV 87.5 10/18/2021   PLT 289 10/18/2021   NEUTROABS 10.2 (H) 10/10/2021    Lab Results  Component Value Date   NA 135 10/17/2021   K 4.0 10/17/2021   CL 105 10/17/2021   CO2 24 10/17/2021    Studies:  CT Angio Chest PE W/Cm &/Or Wo Cm  Result Date: 10/16/2021 CLINICAL DATA:  Tachycardia, dyspnea, midsternal chest pain EXAM: CT  ANGIOGRAPHY CHEST WITH CONTRAST TECHNIQUE: Multidetector CT imaging of the chest was performed using the standard protocol during bolus administration of intravenous contrast. Multiplanar CT image reconstructions and MIPs were obtained to evaluate the vascular anatomy. CONTRAST:  50m OMNIPAQUE IOHEXOL 350 MG/ML SOLN COMPARISON:  10/02/2021 FINDINGS: Cardiovascular: There is adequate opacification of the pulmonary arterial tree. And intraluminal filling defect is now identified within the posterior basal segmental pulmonary artery of the right lower lobe in keeping with an acute pulmonary embolus. The overall embolic burden is small. The central pulmonary arteries are of normal caliber. There is, however, CT evidence of right heart strain with inversion of the normal RV/LV ratio. The RV/LV ratio is 1.1. No significant coronary artery calcification. Global cardiac size is within normal limits. Moderate pericardial effusion is present, slightly decreased in size since prior examination. Thoracic aorta is unremarkable. Mediastinum/Nodes: Pathologic bilateral axillary adenopathy is stable. Thyroid unremarkable. Esophagus is unremarkable. Lungs/Pleura: Right pleural effusion has resolved. Left pleural effusion has decreased in size. Persistent subtotal collapse of the left lower lobe. No superimposed focal pulmonary infiltrate or nodule. Central airways are widely patent. Upper Abdomen: No acute abnormality.  Musculoskeletal: No chest wall abnormality. No acute or significant osseous findings. Review of the MIP images confirms the above findings. IMPRESSION: Acute pulmonary embolism. The embolic burden is small. While there is CT evidence of right heart strain (RV/LV ratio 1.1) this appears unchanged from prior CT examination of 10/02/2021 and, as such, right heart strain may not be attributable to the acute pulmonary embolism but may, in fact, reflect a chronic underlying abnormality. Mild interval decrease in size of  pericardial effusion. No CT evidence of cardiac tamponade. Interval resolution of right pleural effusion and decrease of left pleural effusion. Persistent subtotal left lower lobe collapse. Stable pathologic bilateral axillary lymphadenopathy, compatible with an underlying lymphoproliferative process such as chronic lymphocytic leukemia. These results were called by telephone at the time of interpretation on 10/16/2021 at 10:43 pm to provider [facility] , who verbally acknowledged these results. Electronically Signed   By: Fidela Salisbury M.D.   On: 10/16/2021 22:46   US Venous Img Lower Bilateral (DVT)  Result Date: 10/17/2021 CLINICAL DATA:  Lower extremity pain and known pulmonary emboli EXAM: BILATERAL LOWER EXTREMITY VENOUS DOPPLER ULTRASOUND TECHNIQUE: Gray-scale sonography with graded compression, as well as color Doppler and duplex ultrasound were performed to evaluate the lower extremity deep venous systems from the level of the common femoral vein and including the common femoral, femoral, profunda femoral, popliteal and calf veins including the posterior tibial, peroneal and gastrocnemius veins when visible. The superficial great saphenous vein was also interrogated. Spectral Doppler was utilized to evaluate flow at rest and with distal augmentation maneuvers in the common femoral, femoral and popliteal veins. COMPARISON:  None. FINDINGS: RIGHT LOWER EXTREMITY Common Femoral Vein: No evidence of thrombus. Normal compressibility, respiratory phasicity and response to augmentation. Saphenofemoral Junction: No evidence of thrombus. Normal compressibility and flow on color Doppler imaging. Profunda Femoral Vein: No evidence of thrombus. Normal compressibility and flow on color Doppler imaging. Femoral Vein: No evidence of thrombus. Normal compressibility, respiratory phasicity and response to augmentation. Popliteal Vein: No evidence of thrombus. Normal compressibility, respiratory phasicity and  response to augmentation. Calf Veins: No evidence of thrombus. Normal compressibility and flow on color Doppler imaging. Superficial Great Saphenous Vein: No evidence of thrombus. Normal compressibility. Venous Reflux:  None. Other Findings:  None. LEFT LOWER EXTREMITY Common Femoral Vein: No evidence of thrombus. Normal compressibility, respiratory phasicity and response to augmentation. Saphenofemoral Junction: No evidence of thrombus. Normal compressibility and flow on color Doppler imaging. Profunda Femoral Vein: No evidence of thrombus. Normal compressibility and flow on color Doppler imaging. Femoral Vein: No evidence of thrombus. Normal compressibility, respiratory phasicity and response to augmentation. Popliteal Vein: No evidence of thrombus. Normal compressibility, respiratory phasicity and response to augmentation. Calf Veins: No evidence of thrombus. Normal compressibility and flow on color Doppler imaging. Superficial Great Saphenous Vein: No evidence of thrombus. Normal compressibility. Venous Reflux:  None. Other Findings:  None. IMPRESSION: No evidence of deep venous thrombosis in either lower extremity. Electronically Signed   By: Inez Catalina M.D.   On: 10/17/2021 00:31   ECHOCARDIOGRAM COMPLETE  Result Date: 10/17/2021    ECHOCARDIOGRAM REPORT   Patient Name:   Andres Mullins [facility] Date of Exam: 10/17/2021 Medical Rec #:  [id number]       Height:       66.0 in Accession #:    [id number]      Weight:       154.4 lb Date of Birth:  [birth year]       BSA:  1.792 m Patient Age:    70 years        BP:           105/78 mmHg Patient Gender: M               HR:           97 bpm. Exam Location:  [facility] Procedure: 2D Echo, Color Doppler and Cardiac Doppler Indications:     I26.09 Pulmonary Embolus  History:         Patient has prior history of Echocardiogram examinations, most                  recent 10/04/2021. Risk Factors:Dyslipidemia and Current Smoker.  Sonographer:     Charmayne Sheer Referring Phys:   [id number] AMY N COX Diagnosing Phys: Kathlyn Sacramento MD IMPRESSIONS  1. Left ventricular ejection fraction, by estimation, is 55 to 60%. The left ventricle has normal function. The left ventricle has no regional wall motion abnormalities. Left ventricular diastolic parameters were normal.  2. Right ventricular systolic function is normal. The right ventricular size is normal.  3. A small pericardial effusion is present. The pericardial effusion is circumferential.  4. The mitral valve is normal in structure. No evidence of mitral valve regurgitation. No evidence of mitral stenosis.  5. The aortic valve is normal in structure. Aortic valve regurgitation is not visualized. No aortic stenosis is present.  6. Aortic dilatation noted. There is mild dilatation of the aortic root, measuring 40 mm.  7. The inferior vena cava is normal in size with greater than 50% respiratory variability, suggesting right atrial pressure of 3 mmHg. FINDINGS  Left Ventricle: Left ventricular ejection fraction, by estimation, is 55 to 60%. The left ventricle has normal function. The left ventricle has no regional wall motion abnormalities. The left ventricular internal cavity size was normal in size. There is  no left ventricular hypertrophy. Left ventricular diastolic parameters were normal. Right Ventricle: The right ventricular size is normal. No increase in right ventricular wall thickness. Right ventricular systolic function is normal. Left Atrium: Left atrial size was normal in size. Right Atrium: Right atrial size was normal in size. Pericardium: A small pericardial effusion is present. The pericardial effusion is circumferential. Mitral Valve: The mitral valve is normal in structure. No evidence of mitral valve regurgitation. No evidence of mitral valve stenosis. MV peak gradient, 2.3 mmHg. The mean mitral valve gradient is 1.0 mmHg. Tricuspid Valve: The tricuspid valve is normal in structure. Tricuspid valve regurgitation is trivial. No  evidence of tricuspid stenosis. Aortic Valve: The aortic valve is normal in structure. Aortic valve regurgitation is not visualized. No aortic stenosis is present. Aortic valve mean gradient measures 3.0 mmHg. Aortic valve peak gradient measures 6.1 mmHg. Aortic valve area, by VTI measures 2.92 cm. Pulmonic Valve: The pulmonic valve was normal in structure. Pulmonic valve regurgitation is mild. No evidence of pulmonic stenosis. Aorta: Aortic dilatation noted. There is mild dilatation of the aortic root, measuring 40 mm. Venous: The inferior vena cava is normal in size with greater than 50% respiratory variability, suggesting right atrial pressure of 3 mmHg. IAS/Shunts: No atrial level shunt detected by color flow Doppler.  LEFT VENTRICLE PLAX 2D LVIDd:         4.56 cm   Diastology LVIDs:         2.64 cm   LV e' medial:    10.90 cm/s LV PW:         1.02 cm  LV E/e' medial:  7.2 LV IVS:        0.75 cm   LV e' lateral:   13.30 cm/s LVOT diam:     2.10 cm   LV E/e' lateral: 5.9 LV SV:         50 LV SV Index:   28 LVOT Area:     3.46 cm  RIGHT VENTRICLE RV Basal diam:  2.85 cm LEFT ATRIUM             Index LA diam:        3.60 cm 2.01 cm/m LA Vol (A2C):   35.7 ml 19.93 ml/m LA Vol (A4C):   24.1 ml 13.45 ml/m LA Biplane Vol: 29.5 ml 16.46 ml/m  AORTIC VALVE                    PULMONIC VALVE AV Area (Vmax):    3.04 cm     PV Vmax:       1.04 m/s AV Area (Vmean):   2.84 cm     PV Vmean:      67.400 cm/s AV Area (VTI):     2.92 cm     PV VTI:        0.167 m AV Vmax:           123.00 cm/s  PV Peak grad:  4.3 mmHg AV Vmean:          83.200 cm/s  PV Mean grad:  2.0 mmHg AV VTI:            0.171 m AV Peak Grad:      6.1 mmHg AV Mean Grad:      3.0 mmHg LVOT Vmax:         108.00 cm/s LVOT Vmean:        68.200 cm/s LVOT VTI:          0.144 m LVOT/AV VTI ratio: 0.84  AORTA Ao Root diam: 4.00 cm MITRAL VALVE MV Area (PHT): 4.60 cm    SHUNTS MV Area VTI:   2.93 cm    Systemic VTI:  0.14 m MV Peak grad:  2.3 mmHg     Systemic Diam: 2.10 cm MV Mean grad:  1.0 mmHg MV Vmax:       0.75 m/s MV Vmean:      55.6 cm/s MV Decel Time: 165 msec MV E velocity: 78.80 cm/s MV A velocity: 70.30 cm/s MV E/A ratio:  1.12 Kathlyn Sacramento MD Electronically signed by Kathlyn Sacramento MD Signature Date/Time: 10/17/2021/2:28:57 PM    Final     CLL (chronic lymphocytic leukemia) ([place]) #70 year old male patient 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  #SLL/CLL-currently s/p Gazyva plus venetoclax.  Significant partial response noted-significant improvement of the lymphadenopathy.  Would recommend holding further therapy given acute issues/see below.  Stable.  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  Infections-versus inflammation; again as discussed above less likely malignancy-likely cause of patient's current symptoms of pleuritic chest pain. + Significant improvement noted on steroids.  Defer to further oral steroids to cardiology.  #Right lower lobe low-volume PE-currently on IV heparin.  Discussed with vascular surgery.  No plans for thrombectomy.  Patient can be transitioned to Eliquis.  Discussed with Dr. Posey Pronto.  #Of care was discussed with the patient and his wife by the bedside in detail.  They are in agreement.  We will have the  patient follow-up in the clinic in approximately 2 weeks.    Cammie Sickle, MD 10/18/2021  8:48 PM

## 2021-10-18 NOTE — Progress Notes (Signed)
ANTICOAGULATION CONSULT NOTE  Pharmacy Consult for Eliquis Indication: pulmonary embolus  Patient Measurements: Height: 5' 6"  (167.6 cm) Weight: 73.3 kg (161 lb 11.2 oz) IBW/kg (Calculated) : 63.8  Labs: Recent Labs    10/16/21 2022 10/16/21 2227 10/17/21 0527 10/17/21 1334 10/17/21 2139 10/18/21 0319 10/18/21 0957  HGB 14.9  --  12.7*  --   --  12.9*  --   HCT 44.5  --  38.4*  --   --  38.4*  --   PLT 420*  --  344  --   --  289  --   APTT 32  --   --   --   --   --   --   LABPROT 13.6  --   --   --   --   --   --   INR 1.0  --   --   --   --   --   --   HEPARINUNFRC  --   --  0.11*   < > 0.12* 0.45 0.41  CREATININE 0.64  --  0.53*  --   --   --   --   TROPONINIHS 3 3  --   --   --   --   --    < > = values in this interval not displayed.     Estimated Creatinine Clearance: 79.8 mL/min (A) (by C-G formula based on SCr of 0.53 mg/dL (L)).   Medical History: Past Medical History:  Diagnosis Date   Atherosclerosis of coronary artery 09/28/2016   Chest CT Nov 2017   Atrial tachycardia (HCC)    CLL (chronic lymphocytic leukemia) (HCC)    Elevated C-reactive protein (CRP)    Elevated PSA    Fractured sternum 1999   Herpes    History of rib fracture 1999   multiple   Hyperlipidemia    Psoriasis    Rheumatoid factor positive 02/2014   Rosacea    Thoracic aortic atherosclerosis ([place]) 09/28/2016   Chest CT Nov 2017   Tobacco abuse    Vitamin D deficiency disease     Assessment: Pt is 70 yo male presenting with sudden onset of SOB and midsternum CP found with acute pulmonary embolism. Pt was on heparin infusion. Pharmacy has been consulted for apixaban dosing.   Goal of Therapy:  Monitor platelets by anticoagulation protocol: Yes  Plan:  Discontinue heparin and start Eliquis 10 mg BID x 7 days followed by 5 mg BID CBC per protocol  Insiya Oshea O Jarae Nemmers 10/18/2021 1:55 PM

## 2021-10-18 NOTE — Progress Notes (Signed)
ANTICOAGULATION CONSULT NOTE  Pharmacy Consult for heparin infusion Indication: pulmonary embolus  Patient Measurements: Height: 5' 6"  (167.6 cm) Weight: 73.3 kg (161 lb 11.2 oz) IBW/kg (Calculated) : 63.8 Heparin Dosing Weight: 70.1 kg  Labs: Recent Labs    10/16/21 2022 10/16/21 2227 10/17/21 0527 10/17/21 1334 10/17/21 2139 10/18/21 0319 10/18/21 0957  HGB 14.9  --  12.7*  --   --  12.9*  --   HCT 44.5  --  38.4*  --   --  38.4*  --   PLT 420*  --  344  --   --  289  --   APTT 32  --   --   --   --   --   --   LABPROT 13.6  --   --   --   --   --   --   INR 1.0  --   --   --   --   --   --   HEPARINUNFRC  --   --  0.11*   < > 0.12* 0.45 0.41  CREATININE 0.64  --  0.53*  --   --   --   --   TROPONINIHS 3 3  --   --   --   --   --    < > = values in this interval not displayed.     Estimated Creatinine Clearance: 79.8 mL/min (A) (by C-G formula based on SCr of 0.53 mg/dL (L)).   Medical History: Past Medical History:  Diagnosis Date   Atherosclerosis of coronary artery 09/28/2016   Chest CT Nov 2017   Atrial tachycardia (HCC)    CLL (chronic lymphocytic leukemia) (HCC)    Elevated C-reactive protein (CRP)    Elevated PSA    Fractured sternum 1999   Herpes    History of rib fracture 1999   multiple   Hyperlipidemia    Psoriasis    Rheumatoid factor positive 02/2014   Rosacea    Thoracic aortic atherosclerosis ([place]) 09/28/2016   Chest CT Nov 2017   Tobacco abuse    Vitamin D deficiency disease     Assessment: Pt is 70 yo male presenting with sudden onset of SOB and midsternum CP found with acute pulmonary embolism.  Goal of Therapy:  Heparin level 0.3-0.7 units/ml Monitor platelets by anticoagulation protocol: Yes  Date/time HL Comment 11/21 0527 0.11  Subtherapeutic; 1150 units/hr 11/21 1334 0.14 Subtherapeutic; 1350 units/hr 11/21 2139 0.12 Subtherapeutic; 1600 units/hr 11/22 0319 0.45 Therapeutic x 1; 1850 units/hr 11/22 0957 0.41 Therapeutic  x 2; 1850 units/hr  Plan:  HL therapeutic x2, will continue pt on current rate of 1850 units/hr Oncology recommending Eliquis at discharge Recheck HL with AM labs CBC daily while on heparin infusion  Iretha Kirley O Gracious Renken 10/18/2021 10:44 AM

## 2021-10-19 ENCOUNTER — Other Ambulatory Visit: Payer: Self-pay

## 2021-10-19 ENCOUNTER — Other Ambulatory Visit: Payer: Self-pay | Admitting: Internal Medicine

## 2021-10-19 ENCOUNTER — Telehealth: Payer: Self-pay

## 2021-10-19 DIAGNOSIS — C9110 Chronic lymphocytic leukemia of B-cell type not having achieved remission: Secondary | ICD-10-CM

## 2021-10-19 NOTE — Telephone Encounter (Signed)
Done

## 2021-10-19 NOTE — Telephone Encounter (Signed)
Scheduled 11/29 .

## 2021-10-19 NOTE — Telephone Encounter (Signed)
Transition Care Management Unsuccessful Follow-up Telephone Call  Date of discharge and from where:  10/18/21 [facility]  Attempts:  1st Attempt  Reason for unsuccessful TCM follow-up call:  Left voice message

## 2021-10-19 NOTE — Discharge Summary (Addendum)
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  Inpatient Procedure: Limited Echo Indications:    Pericardial effusion  History:        Patient has prior history of Echocardiogram examinations, most                 recent 10/03/2021. Pericardiocentesis yesterday, Arrythmias:PVC;                 Risk Factors:Dyslipidemia and Current Smoker. Pericardial                 effusion post percardial centesis.  Sonographer:    Merrie Roof RDCS Referring Phys: [place]  1. Left ventricular ejection fraction, by estimation, is 55 to 60%. The left ventricle has normal function. The left ventricle has no regional wall motion abnormalities. Left ventricular diastolic parameters are consistent with Grade I diastolic dysfunction (impaired relaxation).  2. Right ventricular systolic function is normal. The right ventricular size is normal.  3. A small pericardial effusion is present. The pericardial effusion is lateral to the left ventricle. There is no evidence of cardiac tamponade.  4. The mitral valve is normal in structure. No evidence of mitral valve regurgitation.  5. The aortic valve is grossly normal.  6. The inferior vena cava is dilated in size with >50% respiratory  variability, suggesting right atrial pressure of 8 mmHg. 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 FINDINGS  Left Ventricle: Left ventricular ejection fraction, by estimation, is 55 to 60%. The left ventricle has normal function. The left ventricle has no regional wall motion abnormalities. Left ventricular diastolic parameters are consistent with Grade I diastolic dysfunction (impaired relaxation). Right Ventricle: The right ventricular size is normal. No increase in right ventricular wall thickness. Right ventricular systolic function is normal. Left Atrium: Left atrial size was normal in size. Right Atrium: Right atrial size was normal in size. Pericardium: A small pericardial effusion is present. The pericardial effusion is lateral to the left ventricle. There is no evidence of cardiac tamponade. Mitral Valve: The  mitral valve is normal in structure. Tricuspid Valve: The tricuspid valve is normal in structure. Tricuspid valve regurgitation is not demonstrated. Aortic Valve: The aortic valve is grossly normal. Pulmonic Valve: The pulmonic valve was not well visualized. Aorta: The aortic root is normal in size and structure. Venous: The inferior vena cava is dilated in size with greater than 50% respiratory variability, suggesting right atrial pressure of 8 mmHg. IAS/Shunts: The interatrial septum was not assessed. IVC IVC diam: 2.70 cm Sanda Klein MD Electronically signed by Sanda Klein MD Signature Date/Time: 10/04/2021/1:10:18 PM    Final    ECHOCARDIOGRAM LIMITED  Result Date: 10/03/2021    ECHOCARDIOGRAM LIMITED REPORT   Patient Name:   Andres Mullins [facility] Date of Exam: 10/03/2021 Medical Rec #:  [id number]       Height:       66.0 in Accession #:    [id number]      Weight:       161.6 lb Date of Birth:  December 01, 1950       BSA:          1.827 m Patient Age:    6 years        BP:           75/110 mmHg Patient Gender: M                HR:           116 bpm. Exam Location:  Inpatient Procedure: Limited Echo and Cardiac Doppler Indications:    I31.3 Pericardial effusion (noninflammatory)  History:        Patient has prior history of Echocardiogram examinations, most                 recent 10/03/2021. Risk Factors:Current Smoker and Dyslipidemia.                 Cardiac tamponade.  Sonographer:    Roseanna Rainbow RDCS Referring Phys: [place]  Sonographer Comments: Pericardiocentesis study. IMPRESSIONS  1. Abnormal septal motion . Left ventricular ejection fraction, by estimation, is 50 to 55%. The left ventricle has low normal function. The left ventricle has no regional wall motion abnormalities. The left ventricular internal cavity size was mildly dilated.  2. Right ventricular systolic function is normal. The right ventricular size is normal.  3. Trivial with no tamponade     . The pericardial effusion is lateral to the left ventricle.  4. The mitral valve is abnormal. No evidence of mitral valve regurgitation. No evidence of mitral stenosis.  5. The aortic valve was not well visualized. Aortic valve regurgitation is mild. Mild aortic valve sclerosis is present, with no evidence of aortic valve stenosis.  6. The inferior vena cava is normal in size with greater than 50% respiratory variability, suggesting right atrial pressure of 3 mmHg. FINDINGS  Left Ventricle: Abnormal septal motion. Left ventricular ejection fraction, by estimation, is 50 to 55%. The left ventricle has low normal function. The left ventricle has no regional wall motion abnormalities. The left ventricular internal cavity size was mildly dilated. There is no left ventricular hypertrophy. Right Ventricle: The right ventricular size is normal. No increase in right ventricular wall thickness. Right ventricular systolic function is normal. Left Atrium: Left atrial size was normal in size. Right Atrium: Right atrial size was normal in size. Pericardium: Trivial with no  tamponade. Trivial pericardial effusion is present. The pericardial effusion is lateral to the left ventricle. Mitral Valve: The mitral valve is  abnormal. There is mild thickening of the mitral valve leaflet(s). There is mild calcification of the mitral valve leaflet(s). No evidence of mitral valve stenosis. Tricuspid Valve: The tricuspid valve is normal in structure. Tricuspid valve regurgitation is trivial. No evidence of tricuspid stenosis. Aortic Valve: The aortic valve was not well visualized. Aortic valve regurgitation is mild. Mild aortic valve sclerosis is present, with no evidence of aortic valve stenosis. Pulmonic Valve: The pulmonic valve was normal in structure. Pulmonic valve regurgitation is not visualized. No evidence of pulmonic stenosis. Aorta: The aortic root is normal in size and structure. Venous: The inferior vena cava is normal in size with greater than 50% respiratory variability, suggesting right atrial pressure of 3 mmHg. IAS/Shunts: No atrial level shunt detected by color flow Doppler. Jenkins Rouge MD Electronically signed by Jenkins Rouge MD Signature Date/Time: 10/03/2021/10:42:38 AM    Final    Results for orders placed or performed during the hospital encounter of 10/16/21  Resp Panel by RT-PCR (Flu A&B, Covid) Nasopharyngeal Swab     Status: None   Collection Time: 10/16/21  8:39 PM   Specimen: Nasopharyngeal Swab; Nasopharyngeal(NP) swabs in vial transport medium  Result Value Ref Range Status   SARS Coronavirus 2 by RT PCR NEGATIVE NEGATIVE Final    Comment: (NOTE) SARS-CoV-2 target nucleic acids are NOT DETECTED.  The SARS-CoV-2 RNA is generally detectable in upper respiratory specimens during the acute phase of infection. The lowest concentration of SARS-CoV-2 viral copies this assay can detect is 138 copies/mL. A negative result does not preclude SARS-Cov-2 infection and should not be used as the sole basis for treatment or other patient management decisions. A  negative result may occur with  improper specimen collection/handling, submission of specimen other than nasopharyngeal swab, presence of viral mutation(s) within the areas targeted by this assay, and inadequate number of viral copies(<138 copies/mL). A negative result must be combined with clinical observations, patient history, and epidemiological information. The expected result is Negative.  Fact Sheet for Patients:  EntrepreneurPulse.com.au  Fact Sheet for Healthcare Providers:  IncredibleEmployment.be  This test is no t yet approved or cleared by the [place] FDA and  has been authorized for detection and/or diagnosis of SARS-CoV-2 by FDA under an Emergency Use Authorization (EUA). This EUA will remain  in effect (meaning this test can be used) for the duration of the COVID-19 declaration under Section 564(b)(1) of the Act, 21 U.S.C.section 360bbb-3(b)(1), unless the authorization is terminated  or revoked sooner.       Influenza A by PCR NEGATIVE NEGATIVE Final   Influenza B by PCR NEGATIVE NEGATIVE Final    Comment: (NOTE) The Xpert Xpress SARS-CoV-2/FLU/RSV plus assay is intended as an aid in the diagnosis of influenza from Nasopharyngeal swab specimens and should not be used as a sole basis for treatment. Nasal washings and aspirates are unacceptable for Xpert Xpress SARS-CoV-2/FLU/RSV testing.  Fact Sheet for Patients: EntrepreneurPulse.com.au  Fact Sheet for Healthcare Providers: IncredibleEmployment.be  This test is not yet approved or cleared by the [place] FDA and has been authorized for detection and/or diagnosis of SARS-CoV-2 by FDA under an Emergency Use Authorization (EUA). This EUA will remain in effect (meaning this test can be used) for the duration of the COVID-19 declaration under Section 564(b)(1) of the Act, 21 U.S.C. section 360bbb-3(b)(1), unless the authorization  is terminated or revoked.  Performed at [facility], [address] [place], [place] [address]   Culture, blood (routine x 2)  Status: None (Preliminary result)   Collection Time: 10/16/21  9:25 PM   Specimen: BLOOD  Result Value Ref Range Status   Specimen Description BLOOD RIGHT ANTECUBITAL  Final   Special Requests   Final    BOTTLES DRAWN AEROBIC AND ANAEROBIC Blood Culture adequate volume   Culture   Final    NO GROWTH 2 DAYS Performed at [facility], [address] [place], [place] [address]    Report Status PENDING  Incomplete  Culture, blood (routine x 2)     Status: None (Preliminary result)   Collection Time: 10/16/21  9:30 PM   Specimen: BLOOD  Result Value Ref Range Status   Specimen Description BLOOD BLOOD RIGHT WRIST  Final   Special Requests   Final    BOTTLES DRAWN AEROBIC AND ANAEROBIC Blood Culture results may not be optimal due to an inadequate volume of blood received in culture bottles   Culture   Final    NO GROWTH 2 DAYS Performed at [facility], [address], [place], [place] [address]    Report Status PENDING  Incomplete  MRSA Next Gen by PCR, Nasal     Status: None   Collection Time: 10/16/21  9:39 PM   Specimen: Nasopharyngeal Swab; Nasal Swab  Result Value Ref Range Status   MRSA by PCR Next Gen NOT DETECTED NOT DETECTED Final    Comment: (NOTE) The GeneXpert MRSA Assay (FDA approved for NASAL specimens only), is one component of a comprehensive MRSA colonization surveillance program. It is not intended to diagnose MRSA infection nor to guide or monitor treatment for MRSA infections. Test performance is not FDA approved in patients less than 61 years old. Performed at [facility], [address] [place], [place] [address]     Signed:  Berle Mull MD.  [facility] 10/19/2021, 8:33 AM

## 2021-10-21 LAB — CULTURE, BLOOD (ROUTINE X 2)
Culture: NO GROWTH
Culture: NO GROWTH
Special Requests: ADEQUATE
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
## 2021-10-24 ENCOUNTER — Encounter: Payer: Self-pay | Admitting: Internal Medicine

## 2021-10-25 ENCOUNTER — Ambulatory Visit (INDEPENDENT_AMBULATORY_CARE_PROVIDER_SITE_OTHER): Payer: Medicare PPO

## 2021-10-25 ENCOUNTER — Other Ambulatory Visit: Payer: Self-pay

## 2021-10-25 DIAGNOSIS — I3139 Other pericardial effusion (noninflammatory): Secondary | ICD-10-CM | POA: Diagnosis not present

## 2021-10-25 LAB — ECHOCARDIOGRAM LIMITED
Calc EF: 58.4 %
S' Lateral: 2.8 cm
Single Plane A2C EF: 58.6 %
Single Plane A4C EF: 59.6 %

## 2021-10-26 ENCOUNTER — Other Ambulatory Visit: Payer: Self-pay | Admitting: *Deleted

## 2021-10-26 MED ORDER — ACYCLOVIR 400 MG PO TABS: 400.0000 mg | ORAL_TABLET | Freq: Two times a day (BID) | ORAL | 1 refills | Status: DC

## 2021-10-27 ENCOUNTER — Encounter: Payer: Self-pay | Admitting: Internal Medicine

## 2021-10-27 ENCOUNTER — Other Ambulatory Visit: Payer: Self-pay

## 2021-10-27 ENCOUNTER — Ambulatory Visit (INDEPENDENT_AMBULATORY_CARE_PROVIDER_SITE_OTHER): Payer: Medicare PPO | Admitting: Physician Assistant

## 2021-10-27 ENCOUNTER — Encounter: Payer: Self-pay | Admitting: Physician Assistant

## 2021-10-27 VITALS — BP 118/78 | HR 82 | Ht 66.0 in | Wt 159.2 lb

## 2021-10-27 DIAGNOSIS — I2584 Coronary atherosclerosis due to calcified coronary lesion: Secondary | ICD-10-CM | POA: Diagnosis not present

## 2021-10-27 DIAGNOSIS — R000 Tachycardia, unspecified: Secondary | ICD-10-CM

## 2021-10-27 DIAGNOSIS — I700 Atherosclerosis of aorta: Secondary | ICD-10-CM

## 2021-10-27 DIAGNOSIS — E785 Hyperlipidemia, unspecified: Secondary | ICD-10-CM

## 2021-10-27 DIAGNOSIS — I2510 Atherosclerotic heart disease of native coronary artery without angina pectoris: Secondary | ICD-10-CM

## 2021-10-27 DIAGNOSIS — Z8701 Personal history of pneumonia (recurrent): Secondary | ICD-10-CM | POA: Diagnosis not present

## 2021-10-27 DIAGNOSIS — I4510 Unspecified right bundle-branch block: Secondary | ICD-10-CM | POA: Diagnosis not present

## 2021-10-27 DIAGNOSIS — I2699 Other pulmonary embolism without acute cor pulmonale: Secondary | ICD-10-CM

## 2021-10-27 DIAGNOSIS — C9110 Chronic lymphocytic leukemia of B-cell type not having achieved remission: Secondary | ICD-10-CM

## 2021-10-27 DIAGNOSIS — I3139 Other pericardial effusion (noninflammatory): Secondary | ICD-10-CM | POA: Diagnosis not present

## 2021-10-27 MED ORDER — COLCHICINE 0.6 MG PO TABS: 0.6000 mg | ORAL_TABLET | Freq: Two times a day (BID) | ORAL | 5 refills | Status: DC

## 2021-10-27 MED ORDER — PANTOPRAZOLE SODIUM 40 MG PO TBEC: 40.0000 mg | DELAYED_RELEASE_TABLET | Freq: Every day | ORAL | 3 refills | Status: DC

## 2021-10-27 NOTE — Patient Instructions (Addendum)
Medication Instructions:  Your physician has recommended you make the following change in your medication:   START Protonix 40 mg once a day Refill sent in for your colchicine   *If you need a refill on your cardiac medications before your next appointment, please call your pharmacy*   Lab Work: BMET, CBC  today  If you have labs (blood work) drawn today and your tests are completely normal, you will receive your results only by: [place] (if you have MyChart) OR A paper copy in the mail If you have any lab test that is abnormal or we need to change your treatment, we will call you to review the results.   Testing/Procedures: None   Follow-Up: At [facility], you and your health needs are our priority.  As part of our continuing mission to provide you with exceptional heart care, we have created designated Provider Care Teams.  These Care Teams include your primary Cardiologist (physician) and Advanced Practice Providers (APPs -  Physician Assistants and Nurse Practitioners) who all work together to provide you with the care you need, when you need it.   Your next appointment:   1 month(s)  The format for your next appointment:   In Person  Provider:   [name], MD or Christell Faith, PA-C

## 2021-10-28 ENCOUNTER — Telehealth: Payer: Self-pay | Admitting: Internal Medicine

## 2021-10-28 ENCOUNTER — Encounter: Payer: Self-pay | Admitting: Internal Medicine

## 2021-10-28 LAB — BASIC METABOLIC PANEL
BUN/Creatinine Ratio: 19 (ref 10–24)
BUN: 16 mg/dL (ref 8–27)
CO2: 22 mmol/L (ref 20–29)
Calcium: 10.1 mg/dL (ref 8.6–10.2)
Chloride: 104 mmol/L (ref 96–106)
Creatinine, Ser: 0.85 mg/dL (ref 0.76–1.27)
Glucose: 88 mg/dL (ref 70–99)
Potassium: 4.2 mmol/L (ref 3.5–5.2)
Sodium: 139 mmol/L (ref 134–144)
eGFR: 95 mL/min/{1.73_m2} (ref >59)

## 2021-10-28 LAB — CBC
Hematocrit: 43.8 % (ref 37.5–51.0)
Hemoglobin: 15.3 g/dL (ref 13.0–17.7)
MCH: 29.9 pg (ref 26.6–33.0)
MCHC: 34.9 g/dL (ref 31.5–35.7)
MCV: 86 fL (ref 79–97)
Platelets: 340 10*3/uL (ref 150–450)
RBC: 5.11 x10E6/uL (ref 4.14–5.80)
RDW: 14.5 % (ref 11.6–15.4)
WBC: 12.7 10*3/uL — ABNORMAL HIGH (ref 3.4–10.8)

## 2021-10-28 NOTE — Telephone Encounter (Signed)
Patient calling  [employer] states that colchicine 0.6 Mg 1 tablet 2 times daily needs pre auth  Please call [phone]

## 2021-10-31 ENCOUNTER — Other Ambulatory Visit (HOSPITAL_COMMUNITY): Payer: Self-pay

## 2021-11-01 ENCOUNTER — Other Ambulatory Visit: Payer: Self-pay

## 2021-11-01 ENCOUNTER — Telehealth: Payer: Self-pay | Admitting: Physician Assistant

## 2021-11-01 ENCOUNTER — Inpatient Hospital Stay (HOSPITAL_BASED_OUTPATIENT_CLINIC_OR_DEPARTMENT_OTHER): Payer: Medicare PPO | Admitting: Internal Medicine

## 2021-11-01 ENCOUNTER — Inpatient Hospital Stay: Payer: Medicare PPO | Attending: Internal Medicine

## 2021-11-01 ENCOUNTER — Encounter: Payer: Self-pay | Admitting: Internal Medicine

## 2021-11-01 ENCOUNTER — Inpatient Hospital Stay: Payer: Medicare PPO

## 2021-11-01 ENCOUNTER — Other Ambulatory Visit: Payer: Self-pay | Admitting: Pharmacist

## 2021-11-01 VITALS — BP 120/83 | HR 94 | Temp 96.3°F | Resp 16 | Ht 66.0 in | Wt 162.0 lb

## 2021-11-01 DIAGNOSIS — I3139 Other pericardial effusion (noninflammatory): Secondary | ICD-10-CM | POA: Insufficient documentation

## 2021-11-01 DIAGNOSIS — Z818 Family history of other mental and behavioral disorders: Secondary | ICD-10-CM | POA: Insufficient documentation

## 2021-11-01 DIAGNOSIS — C9110 Chronic lymphocytic leukemia of B-cell type not having achieved remission: Secondary | ICD-10-CM

## 2021-11-01 DIAGNOSIS — Z8249 Family history of ischemic heart disease and other diseases of the circulatory system: Secondary | ICD-10-CM | POA: Insufficient documentation

## 2021-11-01 DIAGNOSIS — Z7901 Long term (current) use of anticoagulants: Secondary | ICD-10-CM | POA: Diagnosis not present

## 2021-11-01 DIAGNOSIS — Z8261 Family history of arthritis: Secondary | ICD-10-CM | POA: Diagnosis not present

## 2021-11-01 DIAGNOSIS — Z23 Encounter for immunization: Secondary | ICD-10-CM | POA: Insufficient documentation

## 2021-11-01 DIAGNOSIS — Z9049 Acquired absence of other specified parts of digestive tract: Secondary | ICD-10-CM | POA: Diagnosis not present

## 2021-11-01 DIAGNOSIS — Z79899 Other long term (current) drug therapy: Secondary | ICD-10-CM | POA: Diagnosis not present

## 2021-11-01 DIAGNOSIS — F17210 Nicotine dependence, cigarettes, uncomplicated: Secondary | ICD-10-CM | POA: Diagnosis not present

## 2021-11-01 DIAGNOSIS — I2510 Atherosclerotic heart disease of native coronary artery without angina pectoris: Secondary | ICD-10-CM | POA: Insufficient documentation

## 2021-11-01 DIAGNOSIS — R5383 Other fatigue: Secondary | ICD-10-CM | POA: Diagnosis not present

## 2021-11-01 DIAGNOSIS — R590 Localized enlarged lymph nodes: Secondary | ICD-10-CM | POA: Insufficient documentation

## 2021-11-01 DIAGNOSIS — Z5112 Encounter for antineoplastic immunotherapy: Secondary | ICD-10-CM | POA: Diagnosis not present

## 2021-11-01 DIAGNOSIS — Z7952 Long term (current) use of systemic steroids: Secondary | ICD-10-CM | POA: Diagnosis not present

## 2021-11-01 DIAGNOSIS — Z7964 Long term (current) use of myelosuppressive agent: Secondary | ICD-10-CM | POA: Insufficient documentation

## 2021-11-01 LAB — CBC WITH DIFFERENTIAL/PLATELET
Abs Immature Granulocytes: 0.25 10*3/uL — ABNORMAL HIGH (ref 0.00–0.07)
Basophils Absolute: 0 10*3/uL (ref 0.0–0.1)
Basophils Relative: 0 %
Eosinophils Absolute: 0 10*3/uL (ref 0.0–0.5)
Eosinophils Relative: 0 %
HCT: 44.4 % (ref 39.0–52.0)
Hemoglobin: 14.9 g/dL (ref 13.0–17.0)
Immature Granulocytes: 2 %
Lymphocytes Relative: 5 %
Lymphs Abs: 0.7 10*3/uL (ref 0.7–4.0)
MCH: 30.1 pg (ref 26.0–34.0)
MCHC: 33.6 g/dL (ref 30.0–36.0)
MCV: 89.7 fL (ref 80.0–100.0)
Monocytes Absolute: 0.1 10*3/uL (ref 0.1–1.0)
Monocytes Relative: 1 %
Neutro Abs: 13 10*3/uL — ABNORMAL HIGH (ref 1.7–7.7)
Neutrophils Relative %: 92 %
Platelets: 278 10*3/uL (ref 150–400)
RBC: 4.95 MIL/uL (ref 4.22–5.81)
RDW: 15.6 % — ABNORMAL HIGH (ref 11.5–15.5)
WBC: 14 10*3/uL — ABNORMAL HIGH (ref 4.0–10.5)
nRBC: 0 % (ref 0.0–0.2)

## 2021-11-01 LAB — COMPREHENSIVE METABOLIC PANEL
ALT: 20 U/L (ref 0–44)
AST: 27 U/L (ref 15–41)
Albumin: 3.8 g/dL (ref 3.5–5.0)
Alkaline Phosphatase: 64 U/L (ref 38–126)
Anion gap: 12 (ref 5–15)
BUN: 23 mg/dL (ref 8–23)
CO2: 22 mmol/L (ref 22–32)
Calcium: 9.8 mg/dL (ref 8.9–10.3)
Chloride: 101 mmol/L (ref 98–111)
Creatinine, Ser: 0.85 mg/dL (ref 0.61–1.24)
GFR, Estimated: >60 mL/min (ref >60)
Glucose, Bld: 149 mg/dL — ABNORMAL HIGH (ref 70–99)
Potassium: 4.1 mmol/L (ref 3.5–5.1)
Sodium: 135 mmol/L (ref 135–145)
Total Bilirubin: 0.5 mg/dL (ref 0.3–1.2)
Total Protein: 6.5 g/dL (ref 6.5–8.1)

## 2021-11-01 LAB — LACTATE DEHYDROGENASE: LDH: 132 U/L (ref 98–192)

## 2021-11-01 LAB — C-REACTIVE PROTEIN: CRP: 1.1 mg/dL — ABNORMAL HIGH (ref <1.0)

## 2021-11-01 MED ORDER — VENETOCLAX 100 MG PO TABS
100.0000 mg | ORAL_TABLET | Freq: Every day | ORAL | 0 refills | Status: DC
  Filled 2021-11-01 – 2021-11-02 (×2): qty 7, 7d supply, fill #0

## 2021-11-01 MED ORDER — APIXABAN 5 MG PO TABS: 5.0000 mg | ORAL_TABLET | Freq: Two times a day (BID) | ORAL | 5 refills | Status: DC

## 2021-11-01 MED ORDER — VENETOCLAX 50 MG PO TABS
50.0000 mg | ORAL_TABLET | Freq: Every day | ORAL | 0 refills | Status: DC
  Filled 2021-11-01 – 2021-11-02 (×2): qty 7, 7d supply, fill #0

## 2021-11-01 MED ORDER — INFLUENZA VAC A&B SA ADJ QUAD 0.5 ML IM PRSY
0.5000 mL | PREFILLED_SYRINGE | Freq: Once | INTRAMUSCULAR | Status: AC
  Administered 2021-11-01: 0.5 mL via INTRAMUSCULAR
  Filled 2021-11-01: qty 0.5

## 2021-11-01 NOTE — Telephone Encounter (Signed)
Patient stopped in states is having issues with pre-authorization for Eliquis prescriptions. Please call to discuss

## 2021-11-01 NOTE — Telephone Encounter (Signed)
Additional information required due to insurance covered formulary drug [place]. Information needed to proceed with PA will be faxed to our office.

## 2021-11-01 NOTE — Progress Notes (Signed)
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   AUG 2021-Head and neck: The brain demonstrates symmetric appearing diffuse physiologic uptake without focal finding to suggest abnormality. In the left posterior and medial maxillary cavity, there is an area of mucoid deposition seen within SUV max of 28.8. This was seen in the previous study of November. The specific bony erosion, clear-cut soft tissue lesion is not detected. This probably represents a chronic sinus disease focus. The bilateral optic nerves and extraocular muscles to the largest nodes are seen on axial image 98. 1 now demonstrates a short axis measurement of 3.3 cm and the other more medially, a short axis measurement of 2.5 cm. Demonstrate symmetric increased uptake. The left, much greater than right, jugulodigastric and supraclavicular nodes have increased significantly in size and avidity since the previous study. Both of these lymph nodes demonstrate an SUV max of 3.9. No largest lymph node in the right subclavicular space is seen on axial image 104, demonstrating an SUV max of 3.3, and a short axis measurement of 1.4 cm.   On axial image 102, a right paratracheal lymph node is again identified with increased avidity,  located immediately posterior to the right thyroid pole. This area was noted previously, but now increased avidity is seen, with an SUV max of 8.6 in this area. Multiple smaller, and less avid lymph nodes scattered throughout the left greater than right neck are seen, as well as supraclavicular and infraclavicular areas.   Thorax: Significant axillary lymph nodes have increased significantly in size. These are also left greater than right, with the largest on the left seen on image #114 with a short axis measurement of 2.2 cm and an SUV max of 4.3. The largest on the right as seen on image 111, demonstrating a short axis of 1.4 cm and an SUV max of 4.0. Too numerous to count smaller and similarly avid lymph nodes in both axillae are also noted. These are all larger than previously seen and in general, more avid.  Numerous small AP window and lesser mediastinal lymph nodes are seen with minimal increased avidity, appearing similar in size and avidity to the previous study. No other significant mediastinal adenopathy. No hilar adenopathy detected. These likely also represent lymphoproliferative disease.  Hyperinflation of COPD, bibasilar dependent atelectasis, some paucity of lung tissue and biapical scarring are noted without other focal pulmonary mass or nodule, or acute pulmonary parenchymal disease. Physiologic uptake in the left heart is seen, as expected. Small hiatal hernia is again identified.   Abdomen pelvis: Physiologic uptake in the liver, gut and urinary tract is again noted. No focal area of asymmetric uptake in the structures is seen. The spleen also demonstrates generalized increased uptake and stable splenomegaly, up to 14.2 cm. On image 198, a preaortic lymph node is detected of increased size since the previous study, now measuring  up to 1.2 cm short axis.  Several large lymph nodes on image 203 of the mesentery are noted. The largest of these, on image 201 and ureters up to 1.8 cm short  axis. Despite the fact that measurable avidity is not detected in these are the surrounding lymph nodes, these are doubtless involved in the lymphoproliferative disease described, and appear larger. Multiple smaller lymph nodes scattered throughout the mesentery are also seen.  Grossly enlarged right greater than left common iliac chain lymph nodes are again seen, throughout the pelvis and into the inguinal areas bilaterally. The most avid on the right is seen on image #260 to, demonstrates a short axis of 2.5 cm, and an SUV max of 4.9. The largest on the left is identified image #272, demonstrates a short axis of 2.0 cm, and an SUV max of 4.7. These are similar to the previously noted values.  Right greater than left inguinal adenopathy is seen, with the largest lymph node seen on the right image 276, demonstrating a short axis of 1.5 cm and an SUV max of 3.9. This is larger in size and avidity than previously seen. There are bilateral enlarged lymph nodes associated with the external iliac vessels on image 277. These measure 2.0 cm on the right in short axis, with an SUV max of 3.9.  On the left, image 278, the external iliac lymph node is larger at 1.8 cm short axis, but with an SUV max of only 3.8. Lesser avid and smaller lymph nodes on both sides are also detected.   Musculoskeletal: Scattered, insignificant uptake is detected in the muscles especially in the bilateral joints, without focal finding to suggest lymphoproliferative disease.   IMPRESSIONS:  Significant increase in numbers, short axis size, and to a large extent, avidity -in the lymph nodes of the left greater than right neck, supraclavicular and infraclavicular areas, bilateral axillary lymph nodes and common iliac chain, external iliac chain and inguinal lymph nodes, consistent with worsening lymphoproliferative disease. The mediastinal lymph nodes seen are still present and unchanged in size or [significantly] in avidity.    [place]-  Bx- SLL/CLL.July 2022-Discussed with Dr. Asquith-oncologist, [place]-reviewed the pathology of the lymph node biopsy in 2021-positive for SLL; negative for cyclin D1/1114 translocation.    # July 20th, 2022- GAZYVA #1 Aletha Halim plan ading VENATOCLAX after 2 cycles/de-bulking]  # Lung cancer screening- on [facility]  # SURVIVORSHIP:   # GENETICS:   DIAGNOSIS:   STAGE:         ;  GOALS:  CURRENT/MOST RECENT THERAPY :       CLL (chronic lymphocytic leukemia) ([place])  10/29/2019 Initial Diagnosis   CLL (chronic lymphocytic leukemia) ([place])   06/15/2021 -  Chemotherapy   Patient is on Treatment Plan : LYMPHOMA CLL/SLL Venetoclax + Obinutuzumab q28d        INTERVAL HISTORY: Ambulating independently.  Accompanied by his wife.  A pleasant 70 year-old male patient with CLL/SLL -stage IV bulky adenopathy-currently s/p cycle #4-+ venatoclax [50 mg/day-cycle #1]  In the interim patient diagnosed with pericarditis x2 needing admission to hospital.  Patient also had pericardiocentesis-negative for malignancy.  The patient is currently on colchicine/prednisone as per cardiology.  Patient also diagnosed with acute PE currently on Eliquis.  Patient's last treatment with Dyann Kief was in October 2022.  Patient continues to notice significant improvement adenopathy and underarm/neck.  No nausea no vomiting no fevers no chills.  Review of Systems  Constitutional:  Positive for malaise/fatigue. Negative for chills, diaphoresis, fever and weight  loss.  HENT:  Negative for nosebleeds and sore throat.   Eyes:  Negative for double vision.  Respiratory:  Negative for cough, hemoptysis, sputum production, shortness of breath and wheezing.   Cardiovascular:  Negative for chest pain, palpitations, orthopnea and leg swelling.  Gastrointestinal:  Negative for abdominal pain, blood in stool, constipation, diarrhea, heartburn, melena, nausea and vomiting.  Genitourinary:  Negative for dysuria, frequency and urgency.   Musculoskeletal:  Negative for back pain and joint pain.  Skin: Negative.  Negative for itching and rash.  Neurological:  Negative for dizziness, tingling, focal weakness, weakness and headaches.  Endo/Heme/Allergies:  Does not bruise/bleed easily.  Psychiatric/Behavioral:  Negative for depression. The patient is not nervous/anxious and does not have insomnia.     PAST MEDICAL HISTORY :  Past Medical History:  Diagnosis Date   Atherosclerosis of coronary artery 09/28/2016   Chest CT Nov 2017   Atrial tachycardia (HCC)    CLL (chronic lymphocytic leukemia) (HCC)    Elevated C-reactive protein (CRP)    Elevated PSA    Fractured sternum 1999   Herpes    History of rib fracture 1999   multiple   Hyperlipidemia    Psoriasis    Rheumatoid factor positive 02/2014   Rosacea    Thoracic aortic atherosclerosis ([place]) 09/28/2016   Chest CT Nov 2017   Tobacco abuse    Vitamin D deficiency disease     PAST SURGICAL HISTORY :   Past Surgical History:  Procedure Laterality Date   APPENDECTOMY  1974   PERICARDIOCENTESIS N/A 10/03/2021   Procedure: PERICARDIOCENTESIS;  Surgeon: Jettie Booze, MD;  Location: [facility];  Service: Cardiovascular;  Laterality: N/A;   PORTA CATH INSERTION N/A 06/06/2021   Procedure: PORTA CATH INSERTION;  Surgeon: Algernon Huxley, MD;  Location: [facility];  Service: Cardiovascular;  Laterality: N/A;   SHOULDER ARTHROSCOPY  10/23/2016   Procedure: ARTHROSCOPY SHOULDER WITH DISTAL CLAVICLE EXCISION, PARTIAL ACROMIONECTOMY, AND DEBRIDEMENT;  Surgeon: Earnestine Leys, MD;  Location: [facility];  Service: Orthopedics;;   TIBIA FRACTURE SURGERY Left 2004   titanium rod, ORIF    FAMILY HISTORY :   Family History  Problem Relation Age of Onset   Heart disease Father    Osteoarthritis Father    Alzheimer's disease Father    Cancer Neg Hx    Diabetes Neg Hx    Hypertension Neg Hx    Stroke Neg Hx    COPD Neg Hx     SOCIAL HISTORY:    Social History   Tobacco Use   Smoking status: Every Day    Packs/day: 0.50    Years: 45.00    Pack years: 22.50    Types: Cigarettes   Smokeless tobacco: Never   Tobacco comments:    11/6- currently smoking 5 cigarettes a day   Vaping Use   Vaping Use: Never used  Substance Use Topics   Alcohol use: Yes    Alcohol/week: 3.0 standard drinks    Types: 3 Glasses of wine per week    Comment: a couple glasses of wine a week   Drug use: No    ALLERGIES:  is allergic to gluten meal.  MEDICATIONS:  Current Outpatient Medications  Medication Sig Dispense Refill   acetaminophen (TYLENOL) 325 MG tablet Take by mouth every 6 (six) hours as needed.     acyclovir (ZOVIRAX) 400 MG tablet Take 1 tablet (400 mg total) by mouth 2 (two) times daily. 180 tablet 1  allopurinol (ZYLOPRIM) 300 MG tablet Take 1 tablet (300 mg total) by mouth 2 (two) times daily. 120 tablet 3   apixaban (ELIQUIS) 5 MG TABS tablet Take 1 tablet (5 mg total) by mouth 2 (two) times daily. 60 tablet 5   APIXABAN (ELIQUIS) VTE STARTER PACK (10MG AND 5MG) Take as directed on package: start with two-81m tablets twice daily for 7 days. On day 8, switch to one-585mtablet twice daily. 1 each 0   benzonatate (TESSALON) 200 MG capsule Take 1 capsule (200 mg total) by mouth 2 (two) times daily as needed for cough. 20 capsule 0   colchicine 0.6 MG tablet Take 1 tablet (0.6 mg total) by mouth 2 (two) times daily. 60 tablet 5   diphenhydrAMINE (BENADRYL) 25 MG tablet Take 25 mg by mouth every 6 (six) hours as needed.     lidocaine-prilocaine (EMLA) cream Apply 30 -45 mins prior to port access. 30 g 0   montelukast (SINGULAIR) 10 MG tablet Take 1 tablet (10 mg total) by mouth at bedtime. Start 2 days prior to infusion. Do not take on the day of infusion. 60 tablet 0   oxyCODONE (OXY IR/ROXICODONE) 5 MG immediate release tablet Take 1 tablet (5 mg total) by mouth every 6 (six) hours as needed for moderate pain or severe pain. 20 tablet  0   pantoprazole (PROTONIX) 40 MG tablet Take 1 tablet (40 mg total) by mouth daily. 90 tablet 3   predniSONE (DELTASONE) 10 MG tablet Take 5034maily for 14 days,then Take 45m8mily for 7 days,then Take 30mg77mly for 7 days,then Take 20mg 38my for 7 days,then Take 10mg d41m for 7 days, then Take 10mg ev46mother day for 7 days, then stop 160 tablet 0   No current facility-administered medications for this visit.    PHYSICAL EXAMINATION: ECOG PERFORMANCE STATUS: 0 - Asymptomatic  BP 120/83 (BP Location: Left Arm, Patient Position: Sitting, Cuff Size: Normal)   Pulse 94   Temp (!) 96.3 F (35.7 C) (Tympanic)   Resp 16   Ht 5' 6"  (1.676 m)   Wt 162 lb (73.5 kg)   SpO2 99%   BMI 26.15 kg/m   Filed Weights   11/01/21 1053  Weight: 162 lb (73.5 kg)   Shotty left neck adenopathy/bilateral axilla/bilateral inguinal region [at baseline 7 to 8 cm in size]  Physical Exam HENT:     Head: Normocephalic and atraumatic.     Mouth/Throat:     Pharynx: No oropharyngeal exudate.  Eyes:     Pupils: Pupils are equal, round, and reactive to light.  Cardiovascular:     Rate and Rhythm: Normal rate and regular rhythm.  Pulmonary:     Effort: No respiratory distress.     Breath sounds: No wheezing.  Abdominal:     General: Bowel sounds are normal. There is no distension.     Palpations: Abdomen is soft. There is no mass.     Tenderness: There is no abdominal tenderness. There is no guarding or rebound.  Musculoskeletal:        General: No tenderness. Normal range of motion.     Cervical back: Normal range of motion and neck supple.  Skin:    General: Skin is warm.  Neurological:     Mental Status: He is alert and oriented to person, place, and time.  Psychiatric:        Mood and Affect: Affect normal.    LABORATORY DATA:  I have reviewed the data  as listed    Component Value Date/Time   NA 135 11/01/2021 1033   NA 139 10/27/2021 0857   K 4.1 11/01/2021 1033   CL 101  11/01/2021 1033   CO2 22 11/01/2021 1033   GLUCOSE 149 (H) 11/01/2021 1033   BUN 23 11/01/2021 1033   BUN 16 10/27/2021 0857   CREATININE 0.85 11/01/2021 1033   CREATININE 0.84 10/06/2019 0000   CALCIUM 9.8 11/01/2021 1033   PROT 6.5 11/01/2021 1033   ALBUMIN 3.8 11/01/2021 1033   AST 27 11/01/2021 1033   ALT 20 11/01/2021 1033   ALKPHOS 64 11/01/2021 1033   BILITOT 0.5 11/01/2021 1033   GFRNONAA >60 11/01/2021 1033   GFRNONAA 91 10/06/2019 0000   GFRAA >60 12/26/2019 0842   GFRAA 106 10/06/2019 0000    No results found for: SPEP, UPEP  Lab Results  Component Value Date   WBC 14.0 (H) 11/01/2021   NEUTROABS 13.0 (H) 11/01/2021   HGB 14.9 11/01/2021   HCT 44.4 11/01/2021   MCV 89.7 11/01/2021   PLT 278 11/01/2021      Chemistry      Component Value Date/Time   NA 135 11/01/2021 1033   NA 139 10/27/2021 0857   K 4.1 11/01/2021 1033   CL 101 11/01/2021 1033   CO2 22 11/01/2021 1033   BUN 23 11/01/2021 1033   BUN 16 10/27/2021 0857   CREATININE 0.85 11/01/2021 1033   CREATININE 0.84 10/06/2019 0000      Component Value Date/Time   CALCIUM 9.8 11/01/2021 1033   ALKPHOS 64 11/01/2021 1033   AST 27 11/01/2021 1033   ALT 20 11/01/2021 1033   BILITOT 0.5 11/01/2021 1033       RADIOGRAPHIC STUDIES: I have personally reviewed the radiological images as listed and agreed with the findings in the report. No results found.   ASSESSMENT & PLAN:   .CLL (chronic lymphocytic leukemia) ([place]) #SLL/CLL-  currently on  Gazyva + venatoclax. However therapy interrupted-because of pneumonia/pericarditis [see below].  #Currently after 4 cycles of Gazyva+ venetoclax ramp-up at 50 mg-CT scan September 2022-significant response noted.  Last December infusion in October 2022.  #We will plan to restart back Gazyva plus venetoclax in the week of December 27th.  We will start venetoclax at 50 mg a day/and ramp-up.  #Pericardial effusion/pericarditis-unclear etiology question  pneumonia versus others.  Not a common adverse event from chemotherapy/venetoclax.  Continue prednisone/colchicine as per cardiology.  Awaiting quantitative immunoglobulins.  #PE acute [NOV 2022] provoked--currently on Eliquis.  Refill Eliquis.  # Continue  shingles/TLS prophylaxis: Acyclovir/allopurinol- STABLE>   # EVUSHLED: s/p  COVID EVUSHELD PROPHYLAXIS; recommend flu shot/ Covid/ and pneumonia shot  # Mediport placement: No malfunction noted- STABLE.   # DISPOSITION: # Flu shot today # follow up  Dec 27th week- MD; labs- cbc/cmp;mag;phos;LDH- Gazyva-dr.B              Orders Placed This Encounter  Procedures   CBC with Differential/Platelet    Standing Status:   Future    Standing Expiration Date:   11/01/2022   Comprehensive metabolic panel    Standing Status:   Future    Standing Expiration Date:   11/01/2022   Magnesium    Standing Status:   Future    Standing Expiration Date:   11/01/2022   Lactate dehydrogenase    Standing Status:   Future    Standing Expiration Date:   11/01/2022   Phosphorus    Standing Status:   Future  Standing Expiration Date:   11/01/2022            Cammie Sickle, MD 11/01/2021 11:42 AM

## 2021-11-01 NOTE — Telephone Encounter (Signed)
Patient has documented pericardial effusion with pericarditis with associated cardiac tamponade that required emergent pericardiocentesis with residual stable moderate pericardial effusion.  Colchicine is the mainstay for continued medical management in this clinical situation.

## 2021-11-01 NOTE — Telephone Encounter (Signed)
Pt requiring PA for Colchicine 0.6 mg tablet. PA submitted via covermymeds.

## 2021-11-01 NOTE — Telephone Encounter (Signed)
Information has been forwarded via covermymeds.

## 2021-11-01 NOTE — Progress Notes (Signed)
Called patient and her reported only having week 4 (289m/day) left from his original [employer] Dr. BRogue Bussingwants him to restart at [address] continue the escalation from there.   He will take 5057m 7days, 100m95m7 day, 200 mg x 7 days, then 400mg67m day.   Prescriptions sent in to 50mg 34mays and 100mg x21mys.   Planned start the week following Christmas.

## 2021-11-01 NOTE — Telephone Encounter (Signed)
Please advise following below due to [place] preferred covered drug.  The prescriber attests that the requested medication is medically necessary, an exception under Medicare is needed, and Mitigare capsule (which has the same active ingredient as Colcrys [colchicine tablet]) has been tried, failed, or ineffective.  Yes  No  Why does the patient need this drug instead of one of [employer]'s lower cost drugs on the formulary? If no clinical reason is given, the understanding is that none exists.  *Please explain

## 2021-11-01 NOTE — Assessment & Plan Note (Signed)
#  SLL/CLL-  currently on  Gazyva + venatoclax. However therapy interrupted-because of pneumonia/pericarditis [see below].  #Currently after 4 cycles of Gazyva+ venetoclax ramp-up at 50 mg-CT scan September 2022-significant response noted.  Last December infusion in October 2022.  #We will plan to restart back Gazyva plus venetoclax in the week of December 27th.  We will start venetoclax at 50 mg a day/and ramp-up.  #Pericardial effusion/pericarditis-unclear etiology question pneumonia versus others.  Not a common adverse event from chemotherapy/venetoclax.  Continue prednisone/colchicine as per cardiology.  Awaiting quantitative immunoglobulins.  #PE acute [NOV 2022] provoked--currently on Eliquis.  Refill Eliquis.  # Continue  shingles/TLS prophylaxis: Acyclovir/allopurinol- STABLE>   # EVUSHLED: s/p  COVID EVUSHELD PROPHYLAXIS; recommend flu shot/ Covid/ and pneumonia shot  # Mediport placement: No malfunction noted- STABLE.   # DISPOSITION: # Flu shot today # follow up  Dec 27th week- MD; labs- cbc/cmp;mag;phos;LDH- Gazyva-dr.B

## 2021-11-01 NOTE — Telephone Encounter (Signed)
See other telephone encounter. Patient was calling in due to medication cost. In error he said Eliquis but in fact he was talking about Colchicine 0.6 mg. Insurance wants to know if mitigare has tried and failed in order for insurance to cover. Advised that we are currently working on that and awaiting provider review. Advised that we would be in touch if any additional information is needed. I did review GoodRx cost for medication and cheapest location. He verbalized understanding of our conversation, agreement with plan, and had no further questions at this time.

## 2021-11-02 ENCOUNTER — Encounter: Payer: Self-pay | Admitting: Internal Medicine

## 2021-11-02 ENCOUNTER — Other Ambulatory Visit (HOSPITAL_COMMUNITY): Payer: Self-pay

## 2021-11-02 ENCOUNTER — Telehealth: Payer: Self-pay | Admitting: Pharmacy Technician

## 2021-11-02 NOTE — Telephone Encounter (Signed)
Oral Oncology Patient Advocate Encounter   Was successful in securing patient an $22 grant from Patient [facility]) to provide copayment coverage for Venclexta.  This will keep the out of pocket expense at $0.     I have spoken with the patient.    The billing information is as follows and has been shared with [place].   Member ID: [id number] Group ID: [id number] RxBin: [id number] Dates of Eligibility: 08/04/21 through 11/01/22  Fund:  Chronic Lymphocytic [facility] Patient [place] Phone [phone] Fax [phone] 11/02/2021 9:57 AM

## 2021-11-03 ENCOUNTER — Encounter: Payer: Self-pay | Admitting: Internal Medicine

## 2021-11-03 ENCOUNTER — Other Ambulatory Visit (HOSPITAL_COMMUNITY): Payer: Self-pay

## 2021-11-03 ENCOUNTER — Ambulatory Visit: Payer: TRICARE For Life (TFL) | Admitting: Nurse Practitioner

## 2021-11-03 NOTE — Telephone Encounter (Signed)
[facility] and spoke with Pam to initiate an expedited appeal on the denial for colchicine 0.6 mg tablets. Dx given: I31.39, I26.99, and I31.4 She states appeal will take up to 72 hours for determination.  Advised her of Christell Faith PA-C statement of why Mitigare is not an option for this patient.  "Patient has documented pericardial effusion with pericarditis with associated cardiac tamponade that required emergent pericardiocentesis with residual stable moderate pericardial effusion.  Colchicine is the mainstay for continued medical management in this clinical situation."

## 2021-11-04 LAB — IMMUNOGLOBULINS A/E/G/M, SERUM
IgA: 73 mg/dL (ref 61–437)
IgE (Immunoglobulin E), Serum: 8 IU/mL (ref 6–495)
IgG (Immunoglobin G), Serum: 516 mg/dL — ABNORMAL LOW (ref 603–1613)
IgM (Immunoglobulin M), Srm: 15 mg/dL — ABNORMAL LOW (ref 20–172)

## 2021-11-04 NOTE — Telephone Encounter (Signed)
Updated patient that approval was received and that he should now be able to get that filled. He did purchase 30 day supply for now but will most certainly use it when he needs refills. He was appreciative for the call with no further questions at this time.

## 2021-11-04 NOTE — Telephone Encounter (Signed)
Pt has been approved for Colchicine 0.6 mg tablet. 11/27/2020-11/26/2022.  Pt may have to pay deductible, copayment or coinsurance for these services.

## 2021-11-18 ENCOUNTER — Other Ambulatory Visit (HOSPITAL_COMMUNITY): Payer: Self-pay

## 2021-11-25 ENCOUNTER — Inpatient Hospital Stay (HOSPITAL_BASED_OUTPATIENT_CLINIC_OR_DEPARTMENT_OTHER): Payer: Medicare PPO | Admitting: Internal Medicine

## 2021-11-25 ENCOUNTER — Inpatient Hospital Stay: Payer: Medicare PPO

## 2021-11-25 ENCOUNTER — Other Ambulatory Visit: Payer: Self-pay

## 2021-11-25 ENCOUNTER — Encounter: Payer: Self-pay | Admitting: Internal Medicine

## 2021-11-25 VITALS — BP 121/90 | HR 107 | Temp 96.2°F | Resp 18

## 2021-11-25 VITALS — BP 121/80 | HR 90 | Temp 96.5°F | Resp 18 | Wt 169.2 lb

## 2021-11-25 DIAGNOSIS — I3139 Other pericardial effusion (noninflammatory): Secondary | ICD-10-CM | POA: Diagnosis not present

## 2021-11-25 DIAGNOSIS — R590 Localized enlarged lymph nodes: Secondary | ICD-10-CM | POA: Diagnosis not present

## 2021-11-25 DIAGNOSIS — C9110 Chronic lymphocytic leukemia of B-cell type not having achieved remission: Secondary | ICD-10-CM | POA: Diagnosis not present

## 2021-11-25 DIAGNOSIS — F17210 Nicotine dependence, cigarettes, uncomplicated: Secondary | ICD-10-CM | POA: Diagnosis not present

## 2021-11-25 DIAGNOSIS — R5383 Other fatigue: Secondary | ICD-10-CM | POA: Diagnosis not present

## 2021-11-25 DIAGNOSIS — Z5112 Encounter for antineoplastic immunotherapy: Secondary | ICD-10-CM | POA: Diagnosis not present

## 2021-11-25 DIAGNOSIS — Z23 Encounter for immunization: Secondary | ICD-10-CM | POA: Diagnosis not present

## 2021-11-25 DIAGNOSIS — Z7901 Long term (current) use of anticoagulants: Secondary | ICD-10-CM | POA: Diagnosis not present

## 2021-11-25 DIAGNOSIS — I2510 Atherosclerotic heart disease of native coronary artery without angina pectoris: Secondary | ICD-10-CM | POA: Diagnosis not present

## 2021-11-25 LAB — CBC WITH DIFFERENTIAL/PLATELET
Abs Immature Granulocytes: 0.15 10*3/uL — ABNORMAL HIGH (ref 0.00–0.07)
Basophils Absolute: 0.1 10*3/uL (ref 0.0–0.1)
Basophils Relative: 1 %
Eosinophils Absolute: 0 10*3/uL (ref 0.0–0.5)
Eosinophils Relative: 0 %
HCT: 44.3 % (ref 39.0–52.0)
Hemoglobin: 14.9 g/dL (ref 13.0–17.0)
Immature Granulocytes: 2 %
Lymphocytes Relative: 11 %
Lymphs Abs: 1 10*3/uL (ref 0.7–4.0)
MCH: 30.5 pg (ref 26.0–34.0)
MCHC: 33.6 g/dL (ref 30.0–36.0)
MCV: 90.6 fL (ref 80.0–100.0)
Monocytes Absolute: 0.5 10*3/uL (ref 0.1–1.0)
Monocytes Relative: 5 %
Neutro Abs: 7.4 10*3/uL (ref 1.7–7.7)
Neutrophils Relative %: 81 %
Platelets: 274 10*3/uL (ref 150–400)
RBC: 4.89 MIL/uL (ref 4.22–5.81)
RDW: 15.9 % — ABNORMAL HIGH (ref 11.5–15.5)
WBC: 9.1 10*3/uL (ref 4.0–10.5)
nRBC: 0 % (ref 0.0–0.2)

## 2021-11-25 LAB — COMPREHENSIVE METABOLIC PANEL
ALT: 32 U/L (ref 0–44)
AST: 23 U/L (ref 15–41)
Albumin: 3.8 g/dL (ref 3.5–5.0)
Alkaline Phosphatase: 59 U/L (ref 38–126)
Anion gap: 7 (ref 5–15)
BUN: 14 mg/dL (ref 8–23)
CO2: 27 mmol/L (ref 22–32)
Calcium: 9.3 mg/dL (ref 8.9–10.3)
Chloride: 101 mmol/L (ref 98–111)
Creatinine, Ser: 0.86 mg/dL (ref 0.61–1.24)
GFR, Estimated: >60 mL/min (ref >60)
Glucose, Bld: 134 mg/dL — ABNORMAL HIGH (ref 70–99)
Potassium: 3.8 mmol/L (ref 3.5–5.1)
Sodium: 135 mmol/L (ref 135–145)
Total Bilirubin: 0.5 mg/dL (ref 0.3–1.2)
Total Protein: 6.4 g/dL — ABNORMAL LOW (ref 6.5–8.1)

## 2021-11-25 LAB — MAGNESIUM: Magnesium: 2 mg/dL (ref 1.7–2.4)

## 2021-11-25 LAB — PHOSPHORUS: Phosphorus: 3.2 mg/dL (ref 2.5–4.6)

## 2021-11-25 LAB — LACTATE DEHYDROGENASE: LDH: 151 U/L (ref 98–192)

## 2021-11-25 MED ORDER — HEPARIN SOD (PORK) LOCK FLUSH 100 UNIT/ML IV SOLN
500.0000 [IU] | Freq: Once | INTRAVENOUS | Status: AC | PRN
  Administered 2021-11-25: 16:00:00 500 [IU]
  Filled 2021-11-25: qty 5

## 2021-11-25 MED ORDER — SODIUM CHLORIDE 0.9 % IV SOLN
20.0000 mg | Freq: Once | INTRAVENOUS | Status: AC
  Administered 2021-11-25: 10:00:00 20 mg via INTRAVENOUS
  Filled 2021-11-25: qty 20

## 2021-11-25 MED ORDER — SODIUM CHLORIDE 0.9 % IV SOLN
Freq: Once | INTRAVENOUS | Status: AC
  Filled 2021-11-25: qty 250

## 2021-11-25 MED ORDER — DIPHENHYDRAMINE HCL 50 MG/ML IJ SOLN
50.0000 mg | Freq: Once | INTRAMUSCULAR | Status: AC
  Administered 2021-11-25: 10:00:00 50 mg via INTRAVENOUS
  Filled 2021-11-25: qty 1

## 2021-11-25 MED ORDER — ACETAMINOPHEN 325 MG PO TABS
650.0000 mg | ORAL_TABLET | Freq: Once | ORAL | Status: AC
  Administered 2021-11-25: 10:00:00 650 mg via ORAL
  Filled 2021-11-25: qty 2

## 2021-11-25 MED ORDER — HEPARIN SOD (PORK) LOCK FLUSH 100 UNIT/ML IV SOLN
INTRAVENOUS | Status: AC
  Filled 2021-11-25: qty 5

## 2021-11-25 MED ORDER — SODIUM CHLORIDE 0.9 % IV SOLN
1000.0000 mg | Freq: Once | INTRAVENOUS | Status: AC
  Administered 2021-11-25: 11:00:00 1000 mg via INTRAVENOUS
  Filled 2021-11-25: qty 40

## 2021-11-25 NOTE — Assessment & Plan Note (Addendum)
#  SLL/CLL-  currently on  Gazyva + venatoclax. However therapy interrupted-because of pneumonia/pericarditis [see below].  #Currently after 4 cycles of Gazyva+ venetoclax ramp-up at 50 mg-CT scan September 2022-significant response noted.  Last  GAzyva infusion in October 2022.  #We will plan to restart back Gazyva plus venetoclax today.  We will start venetoclax at 50 mg a day/and ramp-up.  Discussed with pharmacy.  We will plan total of 6 cycles of Gazyva+ venatolacx- 12 months.  We will repeat labs weekly.  #Pericardial effusion/pericarditis-unclear etiology question pneumonia versus others.  Not a common adverse event from chemotherapy/venetoclax.  Continue/defer prednisone/colchicine as per cardiology.    #PE acute [NOV 2022] provoked--currently on Eliquis. Continue  Eliquis x 6 months or so..  # Continue  shingles/TLS prophylaxis: Acyclovir; reasonable to stop allopurinol.     # EVUSHLED: s/p  COVID EVUSHELD PROPHYLAXIS; recommend flu shot/ Covid/ and pneumonia shot  # Mediport placement: No malfunction noted- STABLE.   # DISPOSITION: # Andres Mullins today # labs- 1 week- cbc/cmp'mag;phos;LDH  # labs in 2 weeks- MD; labs- cbc/cmp;mag;phos;LDH- -dr.B

## 2021-11-25 NOTE — Patient Instructions (Signed)
[facility] [place]  Discharge Instructions: Thank you for choosing [facility] to provide your oncology and hematology care.  If you have a lab appointment with the [facility], please go directly to the [facility] and check in at the registration area.  Wear comfortable clothing and clothing appropriate for easy access to any Portacath or PICC line.   We strive to give you quality time with your provider. You may need to reschedule your appointment if you arrive late (15 or more minutes).  Arriving late affects you and other patients whose appointments are after yours.  Also, if you miss three or more appointments without notifying the office, you may be dismissed from the clinic at the providers discretion.      For prescription refill requests, have your pharmacy contact our office and allow 72 hours for refills to be completed.    Today you received the following chemotherapy and/or immunotherapy agents: Gazyva      To help prevent nausea and vomiting after your treatment, we encourage you to take your nausea medication as directed.  BELOW ARE SYMPTOMS THAT SHOULD BE REPORTED IMMEDIATELY: *FEVER GREATER THAN 100.4 F (38 C) OR HIGHER *CHILLS OR SWEATING *NAUSEA AND VOMITING THAT IS NOT CONTROLLED WITH YOUR NAUSEA MEDICATION *UNUSUAL SHORTNESS OF BREATH *UNUSUAL BRUISING OR BLEEDING *URINARY PROBLEMS (pain or burning when urinating, or frequent urination) *BOWEL PROBLEMS (unusual diarrhea, constipation, pain near the anus) TENDERNESS IN MOUTH AND THROAT WITH OR WITHOUT PRESENCE OF ULCERS (sore throat, sores in mouth, or a toothache) UNUSUAL RASH, SWELLING OR PAIN  UNUSUAL VAGINAL DISCHARGE OR ITCHING   Items with * indicate a potential emergency and should be followed up as soon as possible or go to the Emergency Department if any problems should occur.  Please show the CHEMOTHERAPY ALERT CARD or IMMUNOTHERAPY ALERT CARD at check-in to the  Emergency Department and triage nurse.  Should you have questions after your visit or need to cancel or reschedule your appointment, please contact [facility] [place] [facility] [place]  [phone] and follow the prompts.  Office hours are 8:00 a.m. to 4:30 p.m. Monday - Friday. Please note that voicemails left after 4:00 p.m. may not be returned until the following business day.  We are closed weekends and major holidays. You have access to a nurse at all times for urgent questions. Please call the main number to the clinic [phone] and follow the prompts.  For any non-urgent questions, you may also contact your provider using MyChart. We now offer e-Visits for anyone 68 and older to request care online for non-urgent symptoms. For details visit [email or web address].   Also download the MyChart app! Go to the app store, search "MyChart", open the app, select [facility], and log in with your MyChart username and password.  Due to Covid, a mask is required upon entering the hospital/clinic. If you do not have a mask, one will be given to you upon arrival. For doctor visits, patients may have 1 support person aged 9 or older with them. For treatment visits, patients cannot have anyone with them due to current Covid guidelines and our immunocompromised population. Obinutuzumab injection What is this medication? OBINUTUZUMAB (OH bi nue TOOZ ue mab) is a monoclonal antibody. It is used to treat chronic lymphocytic leukemia (CLL) and a type of non-Hodgkin lymphoma (NHL), follicular lymphoma. This medicine may be used for other purposes; ask your health care provider or pharmacist if you have questions. COMMON BRAND NAME(S):  GAZYVA What should I tell my care team before I take this medication? They need to know if you have any of these conditions: infection (especially a virus infection such as hepatitis B virus) lung or breathing disease heart disease take medicines that treat or  prevent blood clots an unusual or allergic reaction to obinutuzumab, other medicines, foods, dyes, or preservatives pregnant or trying to get pregnant breast-feeding How should I use this medication? This medicine is for infusion into a vein. It is given by a health care professional in a hospital or clinic setting. Talk to your pediatrician regarding the use of this medicine in children. Special care may be needed. Overdosage: If you think you have taken too much of this medicine contact a poison control center or emergency room at once. NOTE: This medicine is only for you. Do not share this medicine with others. What if I miss a dose? Keep appointments for follow-up doses as directed. It is important not to miss your dose. Call your doctor or health care professional if you are unable to keep an appointment. What may interact with this medication? live virus vaccines This list may not describe all possible interactions. Give your health care provider a list of all the medicines, herbs, non-prescription drugs, or dietary supplements you use. Also tell them if you smoke, drink alcohol, or use illegal drugs. Some items may interact with your medicine. What should I watch for while using this medication? Report any side effects that you notice during your treatment right away, such as changes in your breathing, fever, chills, dizziness or lightheadedness. These effects are more common with the first dose. Visit your prescriber or health care professional for checks on your progress. You will need to have regular blood work. Report any other side effects. The side effects of this medicine can continue after you finish your treatment. Continue your course of treatment even though you feel ill unless your doctor tells you to stop. Call your doctor or health care professional for advice if you get a fever, chills or sore throat, or other symptoms of a cold or flu. Do not treat yourself. This drug  decreases your body's ability to fight infections. Try to avoid being around people who are sick. This medicine may increase your risk to bruise or bleed. Call your doctor or health care professional if you notice any unusual bleeding. Do not become pregnant while taking this medicine or for 6 months after stopping it. Women should inform their doctor if they wish to become pregnant or think they might be pregnant. There is a potential for serious side effects to an unborn child. Talk to your health care professional or pharmacist for more information. Do not breast-feed an infant while taking this medicine or for 6 months after stopping it. What side effects may I notice from receiving this medication? Side effects that you should report to your doctor or health care professional as soon as possible: allergic reactions like skin rash, itching or hives, swelling of the face, lips, or tongue breathing problems changes in vision chest pain or chest tightness confusion dizziness loss of balance or coordination low blood counts - this medicine may decrease the number of white blood cells, red blood cells and platelets. You may be at increased risk for infections and bleeding. signs of decreased platelets or bleeding - bruising, pinpoint red spots on the skin, black, tarry stools, blood in the urine signs of infection - fever or chills, cough, sore throat, pain  or trouble passing urine signs and symptoms of liver injury like dark yellow or brown urine; general ill feeling or flu-like symptoms; light-colored stools; loss of appetite; nausea; right upper belly pain; unusually weak or tired; yellowing of the eyes or skin trouble speaking or understanding trouble walking vomiting Side effects that usually do not require medical attention (report to your doctor or health care professional if they continue or are bothersome): constipation joint pain muscle pain This list may not describe all possible  side effects. Call your doctor for medical advice about side effects. You may report side effects to FDA at 1-800-FDA-1088. Where should I keep my medication? This drug is only given in a hospital or clinic and will not be stored at home. NOTE: This sheet is a summary. It may not cover all possible information. If you have questions about this medicine, talk to your doctor, pharmacist, or health care provider.  2022 [employer] (2019-02-26 00:00:00)

## 2021-11-25 NOTE — Progress Notes (Signed)
Patient denies new problems/concerns today.   °

## 2021-11-25 NOTE — Patient Instructions (Signed)
#  Stop allopurinol.  #Continue rest of the medications as prescribed by cardiology.

## 2021-11-25 NOTE — Progress Notes (Signed)
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   AUG 2021-Head and neck: The brain demonstrates symmetric appearing diffuse physiologic uptake without focal finding to suggest abnormality. In the left posterior and medial maxillary cavity, there is an area of mucoid deposition seen within SUV max of 28.8. This was seen in the previous study of November. The specific bony erosion, clear-cut soft tissue lesion is not detected. This probably represents a chronic sinus disease focus. The bilateral optic nerves and extraocular muscles to the largest nodes are seen on axial image 98. 1 now demonstrates a short axis measurement of 3.3 cm and the other more medially, a short axis measurement of 2.5 cm. Demonstrate symmetric increased uptake. The left, much greater than right, jugulodigastric and supraclavicular nodes have increased significantly in size and avidity since the previous study. Both of these lymph nodes demonstrate an SUV max of 3.9. No largest lymph node in the right subclavicular space is seen on axial image 104, demonstrating an SUV max of 3.3, and a short axis measurement of 1.4 cm.   On axial image 102, a right paratracheal lymph node is again identified with increased avidity,  located immediately posterior to the right thyroid pole. This area was noted previously, but now increased avidity is seen, with an SUV max of 8.6 in this area. Multiple smaller, and less avid lymph nodes scattered throughout the left greater than right neck are seen, as well as supraclavicular and infraclavicular areas.   Thorax: Significant axillary lymph nodes have increased significantly in size. These are also left greater than right, with the largest on the left seen on image #114 with a short axis measurement of 2.2 cm and an SUV max of 4.3. The largest on the right as seen on image 111, demonstrating a short axis of 1.4 cm and an SUV max of 4.0. Too numerous to count smaller and similarly avid lymph nodes in both axillae are also noted. These are all larger than previously seen and in general, more avid.  Numerous small AP window and lesser mediastinal lymph nodes are seen with minimal increased avidity, appearing similar in size and avidity to the previous study. No other significant mediastinal adenopathy. No hilar adenopathy detected. These likely also represent lymphoproliferative disease.  Hyperinflation of COPD, bibasilar dependent atelectasis, some paucity of lung tissue and biapical scarring are noted without other focal pulmonary mass or nodule, or acute pulmonary parenchymal disease. Physiologic uptake in the left heart is seen, as expected. Small hiatal hernia is again identified.   Abdomen pelvis: Physiologic uptake in the liver, gut and urinary tract is again noted. No focal area of asymmetric uptake in the structures is seen. The spleen also demonstrates generalized increased uptake and stable splenomegaly, up to 14.2 cm. On image 198, a preaortic lymph node is detected of increased size since the previous study, now measuring  up to 1.2 cm short axis.  Several large lymph nodes on image 203 of the mesentery are noted. The largest of these, on image 201 and ureters up to 1.8 cm short  axis. Despite the fact that measurable avidity is not detected in these are the surrounding lymph nodes, these are doubtless involved in the lymphoproliferative disease described, and appear larger. Multiple smaller lymph nodes scattered throughout the mesentery are also seen.  Grossly enlarged right greater than left common iliac chain lymph nodes are again seen, throughout the pelvis and into the inguinal areas bilaterally. The most avid on the right is seen on image #260 to, demonstrates a short axis of 2.5 cm, and an SUV max of 4.9. The largest on the left is identified image #272, demonstrates a short axis of 2.0 cm, and an SUV max of 4.7. These are similar to the previously noted values.  Right greater than left inguinal adenopathy is seen, with the largest lymph node seen on the right image 276, demonstrating a short axis of 1.5 cm and an SUV max of 3.9. This is larger in size and avidity than previously seen. There are bilateral enlarged lymph nodes associated with the external iliac vessels on image 277. These measure 2.0 cm on the right in short axis, with an SUV max of 3.9.  On the left, image 278, the external iliac lymph node is larger at 1.8 cm short axis, but with an SUV max of only 3.8. Lesser avid and smaller lymph nodes on both sides are also detected.   Musculoskeletal: Scattered, insignificant uptake is detected in the muscles especially in the bilateral joints, without focal finding to suggest lymphoproliferative disease.   IMPRESSIONS:  Significant increase in numbers, short axis size, and to a large extent, avidity -in the lymph nodes of the left greater than right neck, supraclavicular and infraclavicular areas, bilateral axillary lymph nodes and common iliac chain, external iliac chain and inguinal lymph nodes, consistent with worsening lymphoproliferative disease. The mediastinal lymph nodes seen are still present and unchanged in size or [significantly] in avidity.    Wanda-  Bx- SLL/CLL.July 2022-Discussed with Dr. Asquith-oncologist, [place]-reviewed the pathology of the lymph node biopsy in 2021-positive for SLL; negative for cyclin D1/1114 translocation.    # July 20th, 2022- GAZYVA #1 Aletha Halim plan ading VENATOCLAX after 2 cycles/de-bulking]  # Lung cancer screening- on [facility]  # SURVIVORSHIP:   # GENETICS:   DIAGNOSIS:   STAGE:         ;  GOALS:  CURRENT/MOST RECENT THERAPY :       CLL (chronic lymphocytic leukemia) ([place])  10/29/2019 Initial Diagnosis   CLL (chronic lymphocytic leukemia) ([place])   06/15/2021 -  Chemotherapy   Patient is on Treatment Plan : LYMPHOMA CLL/SLL Venetoclax + Obinutuzumab q28d        INTERVAL HISTORY: Ambulating independently.  Accompanied by his wife.  A pleasant 70 year-old male patient with CLL/SLL -stage IV bulky adenopathy-currently s/p cycle #4-+ venatoclax [50 mg/day-cycle #1]-  Patient's therapy has been interrupted because of pericarditis-of unclear etiology.  Patient is currently on emergency/prednisone taper as per cardiology.  He continues with Eliquis for PE-= small volume. Patient's last treatment with Dyann Kief was in October 2022.   Patient continues to notice significant improvement adenopathy and underarm/neck.  No nausea no vomiting no fevers no chills.  Review of Systems  Constitutional:  Positive for malaise/fatigue. Negative for chills, diaphoresis, fever and weight loss.  HENT:  Negative for nosebleeds and sore throat.  Eyes:  Negative for double vision.  Respiratory:  Negative for cough, hemoptysis, sputum production, shortness of breath and wheezing.   Cardiovascular:  Negative for chest pain, palpitations, orthopnea and leg swelling.  Gastrointestinal:  Negative for abdominal pain, blood in stool, constipation, diarrhea, heartburn, melena, nausea and vomiting.  Genitourinary:  Negative for dysuria, frequency and urgency.  Musculoskeletal:  Negative for back pain and joint pain.  Skin: Negative.   Negative for itching and rash.  Neurological:  Negative for dizziness, tingling, focal weakness, weakness and headaches.  Endo/Heme/Allergies:  Does not bruise/bleed easily.  Psychiatric/Behavioral:  Negative for depression. The patient is not nervous/anxious and does not have insomnia.     PAST MEDICAL HISTORY :  Past Medical History:  Diagnosis Date   Atherosclerosis of coronary artery 09/28/2016   Chest CT Nov 2017   Atrial tachycardia (HCC)    CLL (chronic lymphocytic leukemia) (HCC)    Elevated C-reactive protein (CRP)    Elevated PSA    Fractured sternum 1999   Herpes    History of rib fracture 1999   multiple   Hyperlipidemia    Psoriasis    Rheumatoid factor positive 02/2014   Rosacea    Thoracic aortic atherosclerosis ([place]) 09/28/2016   Chest CT Nov 2017   Tobacco abuse    Vitamin D deficiency disease     PAST SURGICAL HISTORY :   Past Surgical History:  Procedure Laterality Date   APPENDECTOMY  1974   PERICARDIOCENTESIS N/A 10/03/2021   Procedure: PERICARDIOCENTESIS;  Surgeon: Jettie Booze, MD;  Location: [facility];  Service: Cardiovascular;  Laterality: N/A;   PORTA CATH INSERTION N/A 06/06/2021   Procedure: PORTA CATH INSERTION;  Surgeon: Algernon Huxley, MD;  Location: [facility];  Service: Cardiovascular;  Laterality: N/A;   SHOULDER ARTHROSCOPY  10/23/2016   Procedure: ARTHROSCOPY SHOULDER WITH DISTAL CLAVICLE EXCISION, PARTIAL ACROMIONECTOMY, AND DEBRIDEMENT;  Surgeon: Earnestine Leys, MD;  Location: [facility];  Service: Orthopedics;;   TIBIA FRACTURE SURGERY Left 2004   titanium rod, ORIF    FAMILY HISTORY :   Family History  Problem Relation Age of Onset   Heart disease Father    Osteoarthritis Father    Alzheimer's disease Father    Cancer Neg Hx    Diabetes Neg Hx    Hypertension Neg Hx    Stroke Neg Hx    COPD Neg Hx     SOCIAL HISTORY:   Social History   Tobacco Use   Smoking status: Every Day    Packs/day: 0.50     Years: 45.00    Pack years: 22.50    Types: Cigarettes   Smokeless tobacco: Never   Tobacco comments:    11/6- currently smoking 5 cigarettes a day   Vaping Use   Vaping Use: Never used  Substance Use Topics   Alcohol use: Yes    Alcohol/week: 3.0 standard drinks    Types: 3 Glasses of wine per week    Comment: a couple glasses of wine a week   Drug use: No    ALLERGIES:  is allergic to gluten meal.  MEDICATIONS:  Current Outpatient Medications  Medication Sig Dispense Refill   acetaminophen (TYLENOL) 325 MG tablet Take by mouth every 6 (six) hours as needed.     acyclovir (ZOVIRAX) 400 MG tablet Take 1 tablet (400 mg total) by mouth 2 (two) times daily. 180 tablet 1   allopurinol (ZYLOPRIM) 300 MG tablet Take 1 tablet (300 mg  total) by mouth 2 (two) times daily. 120 tablet 3   apixaban (ELIQUIS) 5 MG TABS tablet Take 1 tablet (5 mg total) by mouth 2 (two) times daily. 60 tablet 5   colchicine 0.6 MG tablet Take 1 tablet (0.6 mg total) by mouth 2 (two) times daily. 60 tablet 5   diphenhydrAMINE (BENADRYL) 25 MG tablet Take 25 mg by mouth every 6 (six) hours as needed.     lidocaine-prilocaine (EMLA) cream Apply 30 -45 mins prior to port access. 30 g 0   montelukast (SINGULAIR) 10 MG tablet Take 1 tablet (10 mg total) by mouth at bedtime. Start 2 days prior to infusion. Do not take on the day of infusion. 60 tablet 0   pantoprazole (PROTONIX) 40 MG tablet Take 1 tablet (40 mg total) by mouth daily. 90 tablet 3   predniSONE (DELTASONE) 10 MG tablet Take 72m daily for 14 days,then Take 427mdaily for 7 days,then Take 3070maily for 7 days,then Take 59m50mily for 7 days,then Take 10mg34mly for 7 days, then Take 10mg 60my other day for 7 days, then stop 160 tablet 0   venetoclax (VENCLEXTA) 100 MG tablet Take 1 tablet (100 mg total) by mouth daily. Tablets should be swallowed whole with a meal and a full glass of water. 7 tablet 0   venetoclax (VENCLEXTA) 50 MG tablet Take 1 tablet (50  mg total) by mouth daily. Tablets should be swallowed whole with a meal and a full glass of water. 7 tablet 0   APIXABAN (ELIQUIS) VTE STARTER PACK (10MG AND 5MG) Take as directed on package: start with two-5mg ta58mts twice daily for 7 days. On day 8, switch to one-5mg tab8m twice daily. (Patient not taking: Reported on 11/25/2021) 1 each 0   benzonatate (TESSALON) 200 MG capsule Take 1 capsule (200 mg total) by mouth 2 (two) times daily as needed for cough. (Patient not taking: Reported on 11/25/2021) 20 capsule 0   oxyCODONE (OXY IR/ROXICODONE) 5 MG immediate release tablet Take 1 tablet (5 mg total) by mouth every 6 (six) hours as needed for moderate pain or severe pain. (Patient not taking: Reported on 11/25/2021) 20 tablet 0   No current facility-administered medications for this visit.   Facility-Administered Medications Ordered in Other Visits  Medication Dose Route Frequency Provider Last Rate Last Admin   heparin lock flush 100 UNIT/ML injection            heparin lock flush 100 unit/mL  500 Units Intracatheter Once PRN BrahmandCammie Sickle   obinutuzumab (GAZYVA) 1,000 mg in sodium chloride 0.9 % 250 mL (3.4483 mg/mL) chemo infusion  1,000 mg Intravenous Once BrahmandCammie Sickle    PHYSICAL EXAMINATION: ECOG PERFORMANCE STATUS: 0 - Asymptomatic  BP 121/80    Pulse 90    Temp (!) 96.5 F (35.8 C)    Resp 18    Wt 169 lb 3.2 oz (76.7 kg)    BMI 27.31 kg/m   Filed Weights   11/25/21 0851  Weight: 169 lb 3.2 oz (76.7 kg)   No significant lymphadenopathy noted above or below diaphragm.  Physical Exam HENT:     Head: Normocephalic and atraumatic.     Mouth/Throat:     Pharynx: No oropharyngeal exudate.  Eyes:     Pupils: Pupils are equal, round, and reactive to light.  Cardiovascular:     Rate and Rhythm: Normal rate and regular rhythm.  Pulmonary:  Effort: No respiratory distress.     Breath sounds: No wheezing.  Abdominal:     General: Bowel  sounds are normal. There is no distension.     Palpations: Abdomen is soft. There is no mass.     Tenderness: There is no abdominal tenderness. There is no guarding or rebound.  Musculoskeletal:        General: No tenderness. Normal range of motion.     Cervical back: Normal range of motion and neck supple.  Skin:    General: Skin is warm.  Neurological:     Mental Status: He is alert and oriented to person, place, and time.  Psychiatric:        Mood and Affect: Affect normal.    LABORATORY DATA:  I have reviewed the data as listed    Component Value Date/Time   NA 135 11/25/2021 0822   NA 139 10/27/2021 0857   K 3.8 11/25/2021 0822   CL 101 11/25/2021 0822   CO2 27 11/25/2021 0822   GLUCOSE 134 (H) 11/25/2021 0822   BUN 14 11/25/2021 0822   BUN 16 10/27/2021 0857   CREATININE 0.86 11/25/2021 0822   CREATININE 0.84 10/06/2019 0000   CALCIUM 9.3 11/25/2021 0822   PROT 6.4 (L) 11/25/2021 0822   ALBUMIN 3.8 11/25/2021 0822   AST 23 11/25/2021 0822   ALT 32 11/25/2021 0822   ALKPHOS 59 11/25/2021 0822   BILITOT 0.5 11/25/2021 0822   GFRNONAA >60 11/25/2021 0822   GFRNONAA 91 10/06/2019 0000   GFRAA >60 12/26/2019 0842   GFRAA 106 10/06/2019 0000    No results found for: SPEP, UPEP  Lab Results  Component Value Date   WBC 9.1 11/25/2021   NEUTROABS 7.4 11/25/2021   HGB 14.9 11/25/2021   HCT 44.3 11/25/2021   MCV 90.6 11/25/2021   PLT 274 11/25/2021      Chemistry      Component Value Date/Time   NA 135 11/25/2021 0822   NA 139 10/27/2021 0857   K 3.8 11/25/2021 0822   CL 101 11/25/2021 0822   CO2 27 11/25/2021 0822   BUN 14 11/25/2021 0822   BUN 16 10/27/2021 0857   CREATININE 0.86 11/25/2021 0822   CREATININE 0.84 10/06/2019 0000      Component Value Date/Time   CALCIUM 9.3 11/25/2021 0822   ALKPHOS 59 11/25/2021 0822   AST 23 11/25/2021 0822   ALT 32 11/25/2021 0822   BILITOT 0.5 11/25/2021 3474       RADIOGRAPHIC STUDIES: I have personally  reviewed the radiological images as listed and agreed with the findings in the report. No results found.   ASSESSMENT & PLAN:   .CLL (chronic lymphocytic leukemia) ([place]) #SLL/CLL-  currently on  Gazyva + venatoclax. However therapy interrupted-because of pneumonia/pericarditis [see below].  #Currently after 4 cycles of Gazyva+ venetoclax ramp-up at 50 mg-CT scan September 2022-significant response noted.  Last  GAzyva infusion in October 2022.  #We will plan to restart back Gazyva plus venetoclax today.  We will start venetoclax at 50 mg a day/and ramp-up.  Discussed with pharmacy.  We will plan total of 6 cycles of Gazyva+ venatolacx- 12 months.  We will repeat labs weekly.  #Pericardial effusion/pericarditis-unclear etiology question pneumonia versus others.  Not a common adverse event from chemotherapy/venetoclax.  Continue/defer prednisone/colchicine as per cardiology.    #PE acute [NOV 2022] provoked--currently on Eliquis. Continue  Eliquis x 6 months or so..  # Continue  shingles/TLS prophylaxis: Acyclovir; reasonable to  stop allopurinol.     # EVUSHLED: s/p  COVID EVUSHELD PROPHYLAXIS; recommend flu shot/ Covid/ and pneumonia shot  # Mediport placement: No malfunction noted- STABLE.   # DISPOSITION: # Dyann Kief today # labs- 1 week- cbc/cmp'mag;phos;LDH  # labs in 2 weeks- MD; labs- cbc/cmp;mag;phos;LDH- -dr.B               Orders Placed This Encounter  Procedures   CBC with Differential/Platelet    Standing Status:   Standing    Number of Occurrences:   2    Standing Expiration Date:   11/25/2022   Comprehensive metabolic panel    Standing Status:   Standing    Number of Occurrences:   2    Standing Expiration Date:   11/25/2022   Magnesium    Standing Status:   Standing    Number of Occurrences:   2    Standing Expiration Date:   11/25/2022   Lactate dehydrogenase    Standing Status:   Standing    Number of Occurrences:   2    Standing Expiration Date:    11/25/2022   Phosphorus    Standing Status:   Standing    Number of Occurrences:   2    Standing Expiration Date:   11/25/2022            Cammie Sickle, MD 11/25/2021 11:18 AM

## 2021-12-02 ENCOUNTER — Encounter: Payer: Self-pay | Admitting: Internal Medicine

## 2021-12-02 ENCOUNTER — Inpatient Hospital Stay: Payer: Medicare PPO | Attending: Internal Medicine

## 2021-12-02 ENCOUNTER — Other Ambulatory Visit: Payer: Self-pay

## 2021-12-02 ENCOUNTER — Other Ambulatory Visit (HOSPITAL_COMMUNITY): Payer: Self-pay

## 2021-12-02 DIAGNOSIS — Z298 Encounter for other specified prophylactic measures: Secondary | ICD-10-CM | POA: Insufficient documentation

## 2021-12-02 DIAGNOSIS — R5383 Other fatigue: Secondary | ICD-10-CM | POA: Insufficient documentation

## 2021-12-02 DIAGNOSIS — Z9049 Acquired absence of other specified parts of digestive tract: Secondary | ICD-10-CM | POA: Diagnosis not present

## 2021-12-02 DIAGNOSIS — F17210 Nicotine dependence, cigarettes, uncomplicated: Secondary | ICD-10-CM | POA: Insufficient documentation

## 2021-12-02 DIAGNOSIS — Z86711 Personal history of pulmonary embolism: Secondary | ICD-10-CM | POA: Diagnosis not present

## 2021-12-02 DIAGNOSIS — I319 Disease of pericardium, unspecified: Secondary | ICD-10-CM | POA: Insufficient documentation

## 2021-12-02 DIAGNOSIS — Z818 Family history of other mental and behavioral disorders: Secondary | ICD-10-CM | POA: Insufficient documentation

## 2021-12-02 DIAGNOSIS — D849 Immunodeficiency, unspecified: Secondary | ICD-10-CM | POA: Insufficient documentation

## 2021-12-02 DIAGNOSIS — Z79899 Other long term (current) drug therapy: Secondary | ICD-10-CM | POA: Insufficient documentation

## 2021-12-02 DIAGNOSIS — Z8261 Family history of arthritis: Secondary | ICD-10-CM | POA: Diagnosis not present

## 2021-12-02 DIAGNOSIS — Z8249 Family history of ischemic heart disease and other diseases of the circulatory system: Secondary | ICD-10-CM | POA: Insufficient documentation

## 2021-12-02 DIAGNOSIS — C9110 Chronic lymphocytic leukemia of B-cell type not having achieved remission: Secondary | ICD-10-CM | POA: Diagnosis not present

## 2021-12-02 DIAGNOSIS — E785 Hyperlipidemia, unspecified: Secondary | ICD-10-CM | POA: Diagnosis not present

## 2021-12-02 DIAGNOSIS — Z5112 Encounter for antineoplastic immunotherapy: Secondary | ICD-10-CM | POA: Insufficient documentation

## 2021-12-02 DIAGNOSIS — Z7901 Long term (current) use of anticoagulants: Secondary | ICD-10-CM | POA: Insufficient documentation

## 2021-12-02 DIAGNOSIS — I2510 Atherosclerotic heart disease of native coronary artery without angina pectoris: Secondary | ICD-10-CM | POA: Insufficient documentation

## 2021-12-02 DIAGNOSIS — Z9221 Personal history of antineoplastic chemotherapy: Secondary | ICD-10-CM | POA: Diagnosis not present

## 2021-12-02 LAB — COMPREHENSIVE METABOLIC PANEL
ALT: 34 U/L (ref 0–44)
AST: 23 U/L (ref 15–41)
Albumin: 4.1 g/dL (ref 3.5–5.0)
Alkaline Phosphatase: 62 U/L (ref 38–126)
Anion gap: 10 (ref 5–15)
BUN: 12 mg/dL (ref 8–23)
CO2: 22 mmol/L (ref 22–32)
Calcium: 9.6 mg/dL (ref 8.9–10.3)
Chloride: 101 mmol/L (ref 98–111)
Creatinine, Ser: 0.91 mg/dL (ref 0.61–1.24)
GFR, Estimated: >60 mL/min (ref >60)
Glucose, Bld: 148 mg/dL — ABNORMAL HIGH (ref 70–99)
Potassium: 4.2 mmol/L (ref 3.5–5.1)
Sodium: 133 mmol/L — ABNORMAL LOW (ref 135–145)
Total Bilirubin: 0.6 mg/dL (ref 0.3–1.2)
Total Protein: 6.7 g/dL (ref 6.5–8.1)

## 2021-12-02 LAB — PHOSPHORUS: Phosphorus: 3.2 mg/dL (ref 2.5–4.6)

## 2021-12-02 LAB — CBC WITH DIFFERENTIAL/PLATELET
Abs Immature Granulocytes: 0.23 10*3/uL — ABNORMAL HIGH (ref 0.00–0.07)
Basophils Absolute: 0.1 10*3/uL (ref 0.0–0.1)
Basophils Relative: 0 %
Eosinophils Absolute: 0 10*3/uL (ref 0.0–0.5)
Eosinophils Relative: 0 %
HCT: 46.4 % (ref 39.0–52.0)
Hemoglobin: 16.1 g/dL (ref 13.0–17.0)
Immature Granulocytes: 2 %
Lymphocytes Relative: 2 %
Lymphs Abs: 0.3 10*3/uL — ABNORMAL LOW (ref 0.7–4.0)
MCH: 30.5 pg (ref 26.0–34.0)
MCHC: 34.7 g/dL (ref 30.0–36.0)
MCV: 87.9 fL (ref 80.0–100.0)
Monocytes Absolute: 0.5 10*3/uL (ref 0.1–1.0)
Monocytes Relative: 3 %
Neutro Abs: 13.6 10*3/uL — ABNORMAL HIGH (ref 1.7–7.7)
Neutrophils Relative %: 93 %
Platelets: 258 10*3/uL (ref 150–400)
RBC: 5.28 MIL/uL (ref 4.22–5.81)
RDW: 15.5 % (ref 11.5–15.5)
WBC: 14.7 10*3/uL — ABNORMAL HIGH (ref 4.0–10.5)
nRBC: 0 % (ref 0.0–0.2)

## 2021-12-02 LAB — LACTATE DEHYDROGENASE: LDH: 146 U/L (ref 98–192)

## 2021-12-02 LAB — MAGNESIUM: Magnesium: 1.9 mg/dL (ref 1.7–2.4)

## 2021-12-08 ENCOUNTER — Inpatient Hospital Stay (HOSPITAL_BASED_OUTPATIENT_CLINIC_OR_DEPARTMENT_OTHER): Payer: Medicare PPO | Admitting: Nurse Practitioner

## 2021-12-08 ENCOUNTER — Encounter: Payer: Self-pay | Admitting: Internal Medicine

## 2021-12-08 ENCOUNTER — Other Ambulatory Visit (HOSPITAL_COMMUNITY): Payer: Self-pay

## 2021-12-08 DIAGNOSIS — C9110 Chronic lymphocytic leukemia of B-cell type not having achieved remission: Secondary | ICD-10-CM

## 2021-12-08 NOTE — Progress Notes (Addendum)
I connected by telephone with Andres Mullins on 12/08/2021, 2:36 PM to discuss the potential use of a new treatment, tixagevimab/cilgavimab, for pre-exposure prophylaxis for prevention of coronavirus disease 2019 (COVID-19) caused by the SARS-CoV-2 virus.  I discussed the limitations, risks, security and privacy concerns of performing an evaluation and management service by telemedicine and the availability of in-person appointments. I also discussed with the patient that there may be a patient responsible charge related to this service. The patient expressed understanding and agreed to proceed.   Other persons participating in the visit and their role in the encounter: none   Patients location: home  Providers location: clinic   Chief Complaint: Immunocompromised  This patient is a 71 y.o. male that meets the FDA criteria for Emergency Use Authorization of tixagevimab/cilgavimab for pre-exposure prophylaxis of COVID-19 disease. Pt meets following criteria: Age >12 yr and weight > 40kg Not currently infected with SARS-CoV-2 and has no known recent exposure to an individual infected with SARS-CoV-2 AND Who has moderate to severe immune compromise due to a medical condition or receipt of immunosuppressive medications or treatments and may not mount an adequate immune response to COVID-19 vaccination or  Vaccination with any available COVID-19 vaccine, according to the approved or authorized schedule, is not recommended due to a history of severe adverse reaction (e.g., severe allergic reaction) to a COVID-19 vaccine(s) and/or COVID-19 vaccine component(s).  Patient meets the following definition of mod-severe immune compromised status: 7. Solid tumor malignancies on immunomodulatory chemotherapy or advanced AIDS   I have spoken and communicated the following to the patient or parent/caregiver regarding COVID monoclonal antibody treatment:  FDA has authorized the emergency use of  tixagevimab/cilgavimab for the pre-exposure prophylaxis of COVID-19 in patients with moderate-severe immunocompromised status, who meet above EUA criteria.  The significant known and potential risks and benefits of COVID monoclonal antibody, and the extent to which such potential risks and benefits are unknown.  Information on available alternative treatments and the risks and benefits of those alternatives, including clinical trials.  The patient or parent/caregiver has the option to accept or refuse COVID monoclonal antibody treatment.  After reviewing this information with the patient, agree to receive tixagevimab/cilgavimab  I spent 12 minutes on this telephone encounter.   Jacquelin Hawking, NP, 12/08/2021, 2:36 PM

## 2021-12-09 ENCOUNTER — Ambulatory Visit

## 2021-12-09 ENCOUNTER — Inpatient Hospital Stay: Payer: Medicare PPO

## 2021-12-09 ENCOUNTER — Other Ambulatory Visit: Payer: Self-pay | Admitting: Internal Medicine

## 2021-12-09 ENCOUNTER — Inpatient Hospital Stay (HOSPITAL_BASED_OUTPATIENT_CLINIC_OR_DEPARTMENT_OTHER): Payer: Medicare PPO | Admitting: Internal Medicine

## 2021-12-09 ENCOUNTER — Other Ambulatory Visit (HOSPITAL_COMMUNITY): Payer: Self-pay

## 2021-12-09 ENCOUNTER — Encounter: Payer: Self-pay | Admitting: Internal Medicine

## 2021-12-09 ENCOUNTER — Other Ambulatory Visit: Payer: Self-pay

## 2021-12-09 DIAGNOSIS — C9110 Chronic lymphocytic leukemia of B-cell type not having achieved remission: Secondary | ICD-10-CM

## 2021-12-09 DIAGNOSIS — Z298 Encounter for other specified prophylactic measures: Secondary | ICD-10-CM | POA: Diagnosis not present

## 2021-12-09 DIAGNOSIS — Z5112 Encounter for antineoplastic immunotherapy: Secondary | ICD-10-CM | POA: Diagnosis not present

## 2021-12-09 DIAGNOSIS — R5383 Other fatigue: Secondary | ICD-10-CM | POA: Diagnosis not present

## 2021-12-09 DIAGNOSIS — I2510 Atherosclerotic heart disease of native coronary artery without angina pectoris: Secondary | ICD-10-CM | POA: Diagnosis not present

## 2021-12-09 DIAGNOSIS — I319 Disease of pericardium, unspecified: Secondary | ICD-10-CM | POA: Diagnosis not present

## 2021-12-09 DIAGNOSIS — F17210 Nicotine dependence, cigarettes, uncomplicated: Secondary | ICD-10-CM | POA: Diagnosis not present

## 2021-12-09 DIAGNOSIS — E785 Hyperlipidemia, unspecified: Secondary | ICD-10-CM | POA: Diagnosis not present

## 2021-12-09 DIAGNOSIS — D849 Immunodeficiency, unspecified: Secondary | ICD-10-CM | POA: Diagnosis not present

## 2021-12-09 LAB — COMPREHENSIVE METABOLIC PANEL
ALT: 42 U/L (ref 0–44)
AST: 23 U/L (ref 15–41)
Albumin: 4 g/dL (ref 3.5–5.0)
Alkaline Phosphatase: 59 U/L (ref 38–126)
Anion gap: 5 (ref 5–15)
BUN: 13 mg/dL (ref 8–23)
CO2: 27 mmol/L (ref 22–32)
Calcium: 9 mg/dL (ref 8.9–10.3)
Chloride: 104 mmol/L (ref 98–111)
Creatinine, Ser: 0.94 mg/dL (ref 0.61–1.24)
GFR, Estimated: >60 mL/min (ref >60)
Glucose, Bld: 72 mg/dL (ref 70–99)
Potassium: 3.8 mmol/L (ref 3.5–5.1)
Sodium: 136 mmol/L (ref 135–145)
Total Bilirubin: 0.4 mg/dL (ref 0.3–1.2)
Total Protein: 6.4 g/dL — ABNORMAL LOW (ref 6.5–8.1)

## 2021-12-09 LAB — CBC WITH DIFFERENTIAL/PLATELET
Abs Immature Granulocytes: 0.11 10*3/uL — ABNORMAL HIGH (ref 0.00–0.07)
Basophils Absolute: 0.1 10*3/uL (ref 0.0–0.1)
Basophils Relative: 1 %
Eosinophils Absolute: 0.1 10*3/uL (ref 0.0–0.5)
Eosinophils Relative: 1 %
HCT: 42.3 % (ref 39.0–52.0)
Hemoglobin: 14.7 g/dL (ref 13.0–17.0)
Immature Granulocytes: 1 %
Lymphocytes Relative: 21 %
Lymphs Abs: 2.1 10*3/uL (ref 0.7–4.0)
MCH: 30.4 pg (ref 26.0–34.0)
MCHC: 34.8 g/dL (ref 30.0–36.0)
MCV: 87.4 fL (ref 80.0–100.0)
Monocytes Absolute: 0.8 10*3/uL (ref 0.1–1.0)
Monocytes Relative: 7 %
Neutro Abs: 7.1 10*3/uL (ref 1.7–7.7)
Neutrophils Relative %: 69 %
Platelets: 289 10*3/uL (ref 150–400)
RBC: 4.84 MIL/uL (ref 4.22–5.81)
RDW: 15.1 % (ref 11.5–15.5)
WBC: 10.2 10*3/uL (ref 4.0–10.5)
nRBC: 0 % (ref 0.0–0.2)

## 2021-12-09 LAB — MAGNESIUM: Magnesium: 2 mg/dL (ref 1.7–2.4)

## 2021-12-09 LAB — LACTATE DEHYDROGENASE: LDH: 158 U/L (ref 98–192)

## 2021-12-09 LAB — PHOSPHORUS: Phosphorus: 4.2 mg/dL (ref 2.5–4.6)

## 2021-12-09 MED ORDER — VENETOCLAX 100 MG PO TABS
ORAL_TABLET | ORAL | 0 refills | Status: DC
  Filled 2021-12-09: qty 62, 28d supply, fill #0

## 2021-12-09 NOTE — Progress Notes (Signed)
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   AUG 2021-Head and neck: The brain demonstrates symmetric appearing diffuse physiologic uptake without focal finding to suggest abnormality. In the left posterior and medial maxillary cavity, there is an area of mucoid deposition seen within SUV max of 28.8. This was seen in the previous study of November. The specific bony erosion, clear-cut soft tissue lesion is not detected. This probably represents a chronic sinus disease focus. The bilateral optic nerves and extraocular muscles to the largest nodes are seen on axial image 98. 1 now demonstrates a short axis measurement of 3.3 cm and the other more medially, a short axis measurement of 2.5 cm. Demonstrate symmetric increased uptake. The left, much greater than right, jugulodigastric and supraclavicular nodes have increased significantly in size and avidity since the previous study. Both of these lymph nodes demonstrate an SUV max of 3.9. No largest lymph node in the right subclavicular space is seen on axial image 104, demonstrating an SUV max of 3.3, and a short axis measurement of 1.4 cm.   On axial image 102, a right paratracheal lymph node is again identified with increased avidity,  located immediately posterior to the right thyroid pole. This area was noted previously, but now increased avidity is seen, with an SUV max of 8.6 in this area. Multiple smaller, and less avid lymph nodes scattered throughout the left greater than right neck are seen, as well as supraclavicular and infraclavicular areas.   Thorax: Significant axillary lymph nodes have increased significantly in size. These are also left greater than right, with the largest on the left seen on image #114 with a short axis measurement of 2.2 cm and an SUV max of 4.3. The largest on the right as seen on image 111, demonstrating a short axis of 1.4 cm and an SUV max of 4.0. Too numerous to count smaller and similarly avid lymph nodes in both axillae are also noted. These are all larger than previously seen and in general, more avid.  Numerous small AP window and lesser mediastinal lymph nodes are seen with minimal increased avidity, appearing similar in size and avidity to the previous study. No other significant mediastinal adenopathy. No hilar adenopathy detected. These likely also represent lymphoproliferative disease.  Hyperinflation of COPD, bibasilar dependent atelectasis, some paucity of lung tissue and biapical scarring are noted without other focal pulmonary mass or nodule, or acute pulmonary parenchymal disease. Physiologic uptake in the left heart is seen, as expected. Small hiatal hernia is again identified.   Abdomen pelvis: Physiologic uptake in the liver, gut and urinary tract is again noted. No focal area of asymmetric uptake in the structures is seen. The spleen also demonstrates generalized increased uptake and stable splenomegaly, up to 14.2 cm. On image 198, a preaortic lymph node is detected of increased size since the previous study, now measuring  up to 1.2 cm short axis.  Several large lymph nodes on image 203 of the mesentery are noted. The largest of these, on image 201 and ureters up to 1.8 cm short  axis. Despite the fact that measurable avidity is not detected in these are the surrounding lymph nodes, these are doubtless involved in the lymphoproliferative disease described, and appear larger. Multiple smaller lymph nodes scattered throughout the mesentery are also seen.  Grossly enlarged right greater than left common iliac chain lymph nodes are again seen, throughout the pelvis and into the inguinal areas bilaterally. The most avid on the right is seen on image #260 to, demonstrates a short axis of 2.5 cm, and an SUV max of 4.9. The largest on the left is identified image #272, demonstrates a short axis of 2.0 cm, and an SUV max of 4.7. These are similar to the previously noted values.  Right greater than left inguinal adenopathy is seen, with the largest lymph node seen on the right image 276, demonstrating a short axis of 1.5 cm and an SUV max of 3.9. This is larger in size and avidity than previously seen. There are bilateral enlarged lymph nodes associated with the external iliac vessels on image 277. These measure 2.0 cm on the right in short axis, with an SUV max of 3.9.  On the left, image 278, the external iliac lymph node is larger at 1.8 cm short axis, but with an SUV max of only 3.8. Lesser avid and smaller lymph nodes on both sides are also detected.   Musculoskeletal: Scattered, insignificant uptake is detected in the muscles especially in the bilateral joints, without focal finding to suggest lymphoproliferative disease.   IMPRESSIONS:  Significant increase in numbers, short axis size, and to a large extent, avidity -in the lymph nodes of the left greater than right neck, supraclavicular and infraclavicular areas, bilateral axillary lymph nodes and common iliac chain, external iliac chain and inguinal lymph nodes, consistent with worsening lymphoproliferative disease. The mediastinal lymph nodes seen are still present and unchanged in size or [significantly] in avidity.    [place]-  Bx- SLL/CLL.July 2022-Discussed with Dr. Asquith-oncologist, [place]-reviewed the pathology of the lymph node biopsy in 2021-positive for SLL; negative for cyclin D1/1114 translocation.    # July 20th, 2022- GAZYVA #1 Aletha Halim plan ading VENATOCLAX after 2 cycles/de-bulking]  # Lung cancer screening- on [facility]  # SURVIVORSHIP:   # GENETICS:   DIAGNOSIS:   STAGE:         ;  GOALS:  CURRENT/MOST RECENT THERAPY :       CLL (chronic lymphocytic leukemia) ([place])  10/29/2019 Initial Diagnosis   CLL (chronic lymphocytic leukemia) ([place])   06/15/2021 -  Chemotherapy   Patient is on Treatment Plan : LYMPHOMA CLL/SLL Venetoclax + Obinutuzumab q28d        INTERVAL HISTORY: Ambulating independently.  Alone.  A pleasant 71 year-old male patient with CLL/SLL -stage IV bulky adenopathy-currently s/p cycle #4-+ venatoclax [100 mg/day-cycle #1]  Patient denies any worsening leg swelling.  Denies any new lumps or bumps.  Appetite is good.  Admits to gaining weight-he has just come off his steroids/prednisone.  Patient had been on prednisone for pericarditis.   Review of Systems  Constitutional:  Positive for malaise/fatigue. Negative for chills, diaphoresis, fever and weight loss.  HENT:  Negative for nosebleeds and sore throat.   Eyes:  Negative for double vision.  Respiratory:  Negative for cough, hemoptysis, sputum production, shortness of breath and wheezing.  Cardiovascular:  Negative for chest pain, palpitations, orthopnea and leg swelling.  Gastrointestinal:  Negative for abdominal pain, blood in stool, constipation, diarrhea, heartburn, melena, nausea and vomiting.  Genitourinary:  Negative for dysuria, frequency and urgency.  Musculoskeletal:  Negative for back pain and joint pain.  Skin: Negative.  Negative for itching and rash.  Neurological:  Negative for dizziness, tingling, focal weakness, weakness and headaches.  Endo/Heme/Allergies:  Does not bruise/bleed easily.   Psychiatric/Behavioral:  Negative for depression. The patient is not nervous/anxious and does not have insomnia.     PAST MEDICAL HISTORY :  Past Medical History:  Diagnosis Date   Atherosclerosis of coronary artery 09/28/2016   Chest CT Nov 2017   Atrial tachycardia (HCC)    CLL (chronic lymphocytic leukemia) (HCC)    Elevated C-reactive protein (CRP)    Elevated PSA    Fractured sternum 1999   Herpes    History of rib fracture 1999   multiple   Hyperlipidemia    Psoriasis    Rheumatoid factor positive 02/2014   Rosacea    Thoracic aortic atherosclerosis ([place]) 09/28/2016   Chest CT Nov 2017   Tobacco abuse    Vitamin D deficiency disease     PAST SURGICAL HISTORY :   Past Surgical History:  Procedure Laterality Date   APPENDECTOMY  1974   PERICARDIOCENTESIS N/A 10/03/2021   Procedure: PERICARDIOCENTESIS;  Surgeon: Jettie Booze, MD;  Location: [facility];  Service: Cardiovascular;  Laterality: N/A;   PORTA CATH INSERTION N/A 06/06/2021   Procedure: PORTA CATH INSERTION;  Surgeon: Algernon Huxley, MD;  Location: [facility];  Service: Cardiovascular;  Laterality: N/A;   SHOULDER ARTHROSCOPY  10/23/2016   Procedure: ARTHROSCOPY SHOULDER WITH DISTAL CLAVICLE EXCISION, PARTIAL ACROMIONECTOMY, AND DEBRIDEMENT;  Surgeon: Earnestine Leys, MD;  Location: [facility];  Service: Orthopedics;;   TIBIA FRACTURE SURGERY Left 2004   titanium rod, ORIF    FAMILY HISTORY :   Family History  Problem Relation Age of Onset   Heart disease Father    Osteoarthritis Father    Alzheimer's disease Father    Cancer Neg Hx    Diabetes Neg Hx    Hypertension Neg Hx    Stroke Neg Hx    COPD Neg Hx     SOCIAL HISTORY:   Social History   Tobacco Use   Smoking status: Every Day    Packs/day: 0.50    Years: 45.00    Pack years: 22.50    Types: Cigarettes   Smokeless tobacco: Never   Tobacco comments:    11/6- currently smoking 5 cigarettes a day   Vaping Use    Vaping Use: Never used  Substance Use Topics   Alcohol use: Yes    Alcohol/week: 3.0 standard drinks    Types: 3 Glasses of wine per week    Comment: a couple glasses of wine a week   Drug use: No    ALLERGIES:  is allergic to gluten meal.  MEDICATIONS:  Current Outpatient Medications  Medication Sig Dispense Refill   acetaminophen (TYLENOL) 325 MG tablet Take by mouth every 6 (six) hours as needed.     acyclovir (ZOVIRAX) 400 MG tablet Take 1 tablet (400 mg total) by mouth 2 (two) times daily. 180 tablet 1   allopurinol (ZYLOPRIM) 300 MG tablet Take 1 tablet (300 mg total) by mouth 2 (two) times daily. 120 tablet 3   apixaban (ELIQUIS) 5 MG TABS tablet Take 1 tablet (5  mg total) by mouth 2 (two) times daily. 60 tablet 5   colchicine 0.6 MG tablet Take 1 tablet (0.6 mg total) by mouth 2 (two) times daily. 60 tablet 5   diphenhydrAMINE (BENADRYL) 25 MG tablet Take 25 mg by mouth every 6 (six) hours as needed.     lidocaine-prilocaine (EMLA) cream Apply 30 -45 mins prior to port access. 30 g 0   montelukast (SINGULAIR) 10 MG tablet Take 1 tablet (10 mg total) by mouth at bedtime. Start 2 days prior to infusion. Do not take on the day of infusion. 60 tablet 0   pantoprazole (PROTONIX) 40 MG tablet Take 1 tablet (40 mg total) by mouth daily. 90 tablet 3   predniSONE (DELTASONE) 10 MG tablet Take 2m daily for 14 days,then Take 446mdaily for 7 days,then Take 3075maily for 7 days,then Take 33m66mily for 7 days,then Take 10mg63mly for 7 days, then Take 10mg 52my other day for 7 days, then stop 160 tablet 0   venetoclax (VENCLEXTA) 50 MG tablet Take 1 tablet (50 mg total) by mouth daily. Tablets should be swallowed whole with a meal and a full glass of water. 7 tablet 0   APIXABAN (ELIQUIS) VTE STARTER PACK (10MG AND 5MG) Take as directed on package: start with two-5mg ta82mts twice daily for 7 days. On day 8, switch to one-5mg tab34m twice daily. (Patient not taking: Reported on 11/25/2021) 1  each 0   benzonatate (TESSALON) 200 MG capsule Take 1 capsule (200 mg total) by mouth 2 (two) times daily as needed for cough. (Patient not taking: Reported on 11/25/2021) 20 capsule 0   oxyCODONE (OXY IR/ROXICODONE) 5 MG immediate release tablet Take 1 tablet (5 mg total) by mouth every 6 (six) hours as needed for moderate pain or severe pain. (Patient not taking: Reported on 11/25/2021) 20 tablet 0   venetoclax (VENCLEXTA) 100 MG tablet Take 200 mg once a day x 1 week; and then 400mg onc4mday. Tablets should be swallowed whole with a meal and a full glass of water. 62 tablet 0   No current facility-administered medications for this visit.    PHYSICAL EXAMINATION: ECOG PERFORMANCE STATUS: 0 - Asymptomatic  BP 118/78    Pulse 60    Temp 97.7 F (36.5 C) (Tympanic)    Wt 170 lb (77.1 kg)    BMI 27.44 kg/m   Filed Weights   12/09/21 1543  Weight: 170 lb (77.1 kg)   No significant lymphadenopathy lymphadenopathy in the neck/underarm.  Physical Exam HENT:     Head: Normocephalic and atraumatic.     Mouth/Throat:     Pharynx: No oropharyngeal exudate.  Eyes:     Pupils: Pupils are equal, round, and reactive to light.  Cardiovascular:     Rate and Rhythm: Normal rate and regular rhythm.  Pulmonary:     Effort: No respiratory distress.     Breath sounds: No wheezing.  Abdominal:     General: Bowel sounds are normal. There is no distension.     Palpations: Abdomen is soft. There is no mass.     Tenderness: There is no abdominal tenderness. There is no guarding or rebound.  Musculoskeletal:        General: No tenderness. Normal range of motion.     Cervical back: Normal range of motion and neck supple.  Skin:    General: Skin is warm.  Neurological:     Mental Status: He is alert and oriented to person,  place, and time.  Psychiatric:        Mood and Affect: Affect normal.    LABORATORY DATA:  I have reviewed the data as listed    Component Value Date/Time   NA 136  12/09/2021 1528   NA 139 10/27/2021 0857   K 3.8 12/09/2021 1528   CL 104 12/09/2021 1528   CO2 27 12/09/2021 1528   GLUCOSE 72 12/09/2021 1528   BUN 13 12/09/2021 1528   BUN 16 10/27/2021 0857   CREATININE 0.94 12/09/2021 1528   CREATININE 0.84 10/06/2019 0000   CALCIUM 9.0 12/09/2021 1528   PROT 6.4 (L) 12/09/2021 1528   ALBUMIN 4.0 12/09/2021 1528   AST 23 12/09/2021 1528   ALT 42 12/09/2021 1528   ALKPHOS 59 12/09/2021 1528   BILITOT 0.4 12/09/2021 1528   GFRNONAA >60 12/09/2021 1528   GFRNONAA 91 10/06/2019 0000   GFRAA >60 12/26/2019 0842   GFRAA 106 10/06/2019 0000    No results found for: SPEP, UPEP  Lab Results  Component Value Date   WBC 10.2 12/09/2021   NEUTROABS 7.1 12/09/2021   HGB 14.7 12/09/2021   HCT 42.3 12/09/2021   MCV 87.4 12/09/2021   PLT 289 12/09/2021      Chemistry      Component Value Date/Time   NA 136 12/09/2021 1528   NA 139 10/27/2021 0857   K 3.8 12/09/2021 1528   CL 104 12/09/2021 1528   CO2 27 12/09/2021 1528   BUN 13 12/09/2021 1528   BUN 16 10/27/2021 0857   CREATININE 0.94 12/09/2021 1528   CREATININE 0.84 10/06/2019 0000      Component Value Date/Time   CALCIUM 9.0 12/09/2021 1528   ALKPHOS 59 12/09/2021 1528   AST 23 12/09/2021 1528   ALT 42 12/09/2021 1528   BILITOT 0.4 12/09/2021 1528       RADIOGRAPHIC STUDIES: I have personally reviewed the radiological images as listed and agreed with the findings in the report. No results found.   ASSESSMENT & PLAN:   .CLL (chronic lymphocytic leukemia) ([place]) # SLL/CLL-  currently on  Gazyva x 6 cycles + venatoclax x16m However therapy interrupted-because of pneumonia/pericarditis [see below]. Currently after 5 cycles of Gazyva+ venetoclax.   # currenty on cycle #1 venatoclax 100 mg/day; plan ventoclax 200 mg once day x1 week; and then 400 mg once a day. New scrpit sent.  No evidence of tumor lysis.  #Pericardial effusion/pericarditis-unclear etiology question pneumonia  versus others-s/p prednisone taper.  Monitor closely.  #PE acute [NOV 2022] provoked--currently on Eliquis. Continue  Eliquis x 6 months or so..  # Continue  shingles/TLS prophylaxis: Acyclovir-STABLE.  # EVUSHLED: s/p  COVID EVUSHELD PROPHYLAXIS x2 [jan 24970]   # Mediport placement: No malfunction noted- STABLE.   # DISPOSITION: # JAN 31st MD; labs- cbc/cmp;mag;phos;LDH; Gazyva-Dr.B              Orders Placed This Encounter  Procedures   Comprehensive metabolic panel    Standing Status:   Future    Standing Expiration Date:   12/09/2022   CBC with Differential/Platelet    Standing Status:   Future    Standing Expiration Date:   12/09/2022   Magnesium    Standing Status:   Future    Standing Expiration Date:   12/09/2022   Phosphorus    Standing Status:   Future    Standing Expiration Date:   12/09/2022   Lactate dehydrogenase    Standing Status:   Future  Standing Expiration Date:   12/09/2022            Cammie Sickle, MD 12/09/2021 4:11 PM

## 2021-12-09 NOTE — Assessment & Plan Note (Addendum)
#   SLL/CLL-  currently on  Gazyva x 6 cycles + venatoclax x30m However therapy interrupted-because of pneumonia/pericarditis [see below]. Currently after 5 cycles of Gazyva+ venetoclax.   # currenty on cycle #1 venatoclax 100 mg/day; plan ventoclax 200 mg once day x1 week; and then 400 mg once a day. New scrpit sent.  No evidence of tumor lysis.  #Pericardial effusion/pericarditis-unclear etiology question pneumonia versus others-s/p prednisone taper.  Monitor closely.  #PE acute [NOV 2022] provoked--currently on Eliquis. Continue  Eliquis x 6 months or so..  # Continue  shingles/TLS prophylaxis: Acyclovir-STABLE.  # EVUSHLED: s/p  COVID EVUSHELD PROPHYLAXIS x2 [jan 23094]   # Mediport placement: No malfunction noted- STABLE.   # DISPOSITION: # JAN 31st MD; labs- cbc/cmp;mag;phos;LDH; Gazyva-Dr.B

## 2021-12-09 NOTE — Progress Notes (Signed)
Pt is doing well and no pain. He wants to talk about Tx and medication plan.

## 2021-12-12 ENCOUNTER — Encounter: Payer: Self-pay | Admitting: Physician Assistant

## 2021-12-12 ENCOUNTER — Ambulatory Visit (INDEPENDENT_AMBULATORY_CARE_PROVIDER_SITE_OTHER): Payer: Medicare PPO | Admitting: Physician Assistant

## 2021-12-12 ENCOUNTER — Other Ambulatory Visit (HOSPITAL_COMMUNITY): Payer: Self-pay

## 2021-12-12 ENCOUNTER — Inpatient Hospital Stay: Payer: Medicare PPO

## 2021-12-12 ENCOUNTER — Other Ambulatory Visit: Payer: Self-pay

## 2021-12-12 VITALS — BP 118/80 | HR 86 | Ht 66.0 in | Wt 171.0 lb

## 2021-12-12 DIAGNOSIS — Z8261 Family history of arthritis: Secondary | ICD-10-CM | POA: Diagnosis not present

## 2021-12-12 DIAGNOSIS — I2699 Other pulmonary embolism without acute cor pulmonale: Secondary | ICD-10-CM

## 2021-12-12 DIAGNOSIS — Z5112 Encounter for antineoplastic immunotherapy: Secondary | ICD-10-CM | POA: Diagnosis not present

## 2021-12-12 DIAGNOSIS — Z9049 Acquired absence of other specified parts of digestive tract: Secondary | ICD-10-CM | POA: Diagnosis not present

## 2021-12-12 DIAGNOSIS — I4510 Unspecified right bundle-branch block: Secondary | ICD-10-CM

## 2021-12-12 DIAGNOSIS — Z818 Family history of other mental and behavioral disorders: Secondary | ICD-10-CM | POA: Diagnosis not present

## 2021-12-12 DIAGNOSIS — I2510 Atherosclerotic heart disease of native coronary artery without angina pectoris: Secondary | ICD-10-CM

## 2021-12-12 DIAGNOSIS — C9110 Chronic lymphocytic leukemia of B-cell type not having achieved remission: Secondary | ICD-10-CM

## 2021-12-12 DIAGNOSIS — Z86711 Personal history of pulmonary embolism: Secondary | ICD-10-CM | POA: Diagnosis not present

## 2021-12-12 DIAGNOSIS — I700 Atherosclerosis of aorta: Secondary | ICD-10-CM | POA: Diagnosis not present

## 2021-12-12 DIAGNOSIS — I3139 Other pericardial effusion (noninflammatory): Secondary | ICD-10-CM

## 2021-12-12 DIAGNOSIS — R5383 Other fatigue: Secondary | ICD-10-CM | POA: Diagnosis not present

## 2021-12-12 DIAGNOSIS — E785 Hyperlipidemia, unspecified: Secondary | ICD-10-CM | POA: Diagnosis not present

## 2021-12-12 DIAGNOSIS — Z298 Encounter for other specified prophylactic measures: Secondary | ICD-10-CM | POA: Diagnosis not present

## 2021-12-12 DIAGNOSIS — Z7901 Long term (current) use of anticoagulants: Secondary | ICD-10-CM | POA: Diagnosis not present

## 2021-12-12 DIAGNOSIS — I2584 Coronary atherosclerosis due to calcified coronary lesion: Secondary | ICD-10-CM | POA: Diagnosis not present

## 2021-12-12 DIAGNOSIS — Z79899 Other long term (current) drug therapy: Secondary | ICD-10-CM | POA: Diagnosis not present

## 2021-12-12 DIAGNOSIS — Z9221 Personal history of antineoplastic chemotherapy: Secondary | ICD-10-CM | POA: Diagnosis not present

## 2021-12-12 DIAGNOSIS — Z8249 Family history of ischemic heart disease and other diseases of the circulatory system: Secondary | ICD-10-CM | POA: Diagnosis not present

## 2021-12-12 DIAGNOSIS — D849 Immunodeficiency, unspecified: Secondary | ICD-10-CM | POA: Diagnosis not present

## 2021-12-12 DIAGNOSIS — F17210 Nicotine dependence, cigarettes, uncomplicated: Secondary | ICD-10-CM | POA: Diagnosis not present

## 2021-12-12 DIAGNOSIS — I319 Disease of pericardium, unspecified: Secondary | ICD-10-CM | POA: Diagnosis not present

## 2021-12-12 MED ORDER — EPINEPHRINE 0.3 MG/0.3ML IJ SOAJ: 0.3000 mg | Freq: Once | INTRAMUSCULAR | Status: DC | PRN

## 2021-12-12 MED ORDER — CILGAVIMAB (PART OF EVUSHELD) INJECTION
300.0000 mg | Freq: Once | INTRAMUSCULAR | Status: AC
  Administered 2021-12-12: 300 mg via INTRAMUSCULAR
  Filled 2021-12-12: qty 3

## 2021-12-12 MED ORDER — TIXAGEVIMAB (PART OF EVUSHELD) INJECTION
300.0000 mg | Freq: Once | INTRAMUSCULAR | Status: AC
  Administered 2021-12-12: 300 mg via INTRAMUSCULAR
  Filled 2021-12-12: qty 3

## 2021-12-12 NOTE — Patient Instructions (Addendum)
Medication Instructions:  Your physician has recommended you make the following change in your medication:   STARTING 01/04/22 Decrease Colchicine 0.6 mg to once a day for 2 weeks then STOP.  STOP Protonix once you stop colchicine  *If you need a refill on your cardiac medications before your next appointment, please call your pharmacy*   Lab Work: None  If you have labs (blood work) drawn today and your tests are completely normal, you will receive your results only by: [place] (if you have MyChart) OR A paper copy in the mail If you have any lab test that is abnormal or we need to change your treatment, we will call you to review the results.   Testing/Procedures: None   Follow-Up: At [facility], you and your health needs are our priority.  As part of our continuing mission to provide you with exceptional heart care, we have created designated Provider Care Teams.  These Care Teams include your primary Cardiologist (physician) and Advanced Practice Providers (APPs -  Physician Assistants and Nurse Practitioners) who all work together to provide you with the care you need, when you need it.   Your next appointment:   4-5 month(s)  The format for your next appointment:   In Person  Provider:   [name], MD or Christell Faith, PA-C

## 2021-12-12 NOTE — Progress Notes (Signed)
Pt waited 30 min observation after injections. Completed without incident. Pt discharged stable.
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
## 2021-12-13 ENCOUNTER — Other Ambulatory Visit (HOSPITAL_COMMUNITY): Payer: Self-pay

## 2021-12-16 ENCOUNTER — Encounter: Payer: Self-pay | Admitting: Internal Medicine

## 2021-12-19 ENCOUNTER — Other Ambulatory Visit: Payer: Self-pay

## 2021-12-19 ENCOUNTER — Encounter: Payer: Self-pay | Admitting: Physician Assistant

## 2021-12-19 ENCOUNTER — Ambulatory Visit (INDEPENDENT_AMBULATORY_CARE_PROVIDER_SITE_OTHER): Payer: Medicare PPO | Admitting: Physician Assistant

## 2021-12-19 VITALS — BP 122/78 | HR 116 | Temp 97.6°F | Resp 16 | Ht 66.0 in | Wt 168.8 lb

## 2021-12-19 DIAGNOSIS — E785 Hyperlipidemia, unspecified: Secondary | ICD-10-CM

## 2021-12-19 DIAGNOSIS — F17209 Nicotine dependence, unspecified, with unspecified nicotine-induced disorders: Secondary | ICD-10-CM | POA: Diagnosis not present

## 2021-12-19 DIAGNOSIS — I2699 Other pulmonary embolism without acute cor pulmonale: Secondary | ICD-10-CM

## 2021-12-19 DIAGNOSIS — E559 Vitamin D deficiency, unspecified: Secondary | ICD-10-CM | POA: Diagnosis not present

## 2021-12-19 DIAGNOSIS — Z23 Encounter for immunization: Secondary | ICD-10-CM | POA: Diagnosis not present

## 2021-12-19 DIAGNOSIS — L409 Psoriasis, unspecified: Secondary | ICD-10-CM | POA: Diagnosis not present

## 2021-12-19 LAB — VITAMIN D 25 HYDROXY (VIT D DEFICIENCY, FRACTURES): Vit D, 25-Hydroxy: 27 ng/mL — ABNORMAL LOW (ref 30–100)

## 2021-12-19 MED ORDER — HYDROCORTISONE VALERATE 0.2 % EX CREA: 1.0000 "application " | TOPICAL_CREAM | Freq: Two times a day (BID) | CUTANEOUS | 0 refills | Status: DC

## 2021-12-19 NOTE — Assessment & Plan Note (Signed)
Chronic, historic problem Currently unmanaged  Patient presented today with concerns for Avitaminosis D and requests lab work as well as supplementation information  Recommended 600-800 units of Vitamin D2 or D3 if lab results indicate deficiency  Recheck in at least 6 months

## 2021-12-19 NOTE — Assessment & Plan Note (Signed)
Chronic concern, historic problem Currently patient is using Triamcinolone cream without relief Provided new script for Hydrocortisone valerate 0.2% to assist with resolution Will monitor for response- may need referral to Dermatology for more potent topical steroid recommendation

## 2021-12-19 NOTE — Assessment & Plan Note (Signed)
Chronic, historic problem Currently managed with Eliquis  Discussed patient's concerns over being on Eliquis for prolonged time period and how long he would need it After reviewing patient's medical history and chart review of PE- patient should remain on Eliquis for at least 6 months after initial event.  Recommend discussion with hematology and oncology team to determine prolonged need after initial 6 months since patient is at increased risk of further events

## 2021-12-19 NOTE — Assessment & Plan Note (Signed)
Chronic, historic problem, currently unmanaged Patient states he is gradually reducing cigarettes and is currently down to 5 per day Patient declined discussion and offer of smoking cessation resources today Recommend he continue with reduction efforts  Recommend providers be flexible with cessation methods and offerings to assist with successful quitting.

## 2021-12-19 NOTE — Assessment & Plan Note (Signed)
Unsure of chronicity of condition at this time Patient refused Lipid panel today and states he is skeptical of statins in general Recommend at least yearly monitoring to guide potential lifestyle modifications - will try to impress this upon patient at follow up  At this time recommend physical activity and heart healthy dietary changes to prevent cardiovascular issues.

## 2021-12-19 NOTE — Progress Notes (Signed)
Established Patient Office Visit  Subjective:  Patient ID: Andres Mullins, male    DOB: 19-Dec-1950  Age: 71 y.o. MRN: [id number]  Today's Provider: Talitha Givens, MHS, PA-C  Introduced myself to the patient as a PA-C and provided education on APPs in clinical practice.    CC:  Chief Complaint  Patient presents with   Follow-up   Hyperlipidemia    HPI Andres Mullins presents for follow up for Vitamin D deficiency and historic hyperlipidemia Also would like to get pneumonia vaccine today  States he mainly wishes to discuss Vitamin D supplementation at this time Declines lipid panel today - declines potential future statin therapy for fears of Alzheimer's disease   States he is a current smoker - smoking approx 5 cigarettes per day  Reports concerns of psoriasis on shins not responding to Triamcinolone cream 0.6% per patient   Patient also wants to discuss Eliquis and how long he will need to be on this for his PE found in Nov 2022.   Past Medical History:  Diagnosis Date   Atherosclerosis of coronary artery 09/28/2016   Chest CT Nov 2017   Atrial tachycardia (HCC)    CLL (chronic lymphocytic leukemia) (HCC)    Elevated C-reactive protein (CRP)    Elevated PSA    Fractured sternum 1999   Herpes    History of rib fracture 1999   multiple   Hyperlipidemia    Psoriasis    Rheumatoid factor positive 02/2014   Rosacea    Thoracic aortic atherosclerosis ([place]) 09/28/2016   Chest CT Nov 2017   Tobacco abuse    Vitamin D deficiency disease     Past Surgical History:  Procedure Laterality Date   APPENDECTOMY  1974   PERICARDIOCENTESIS N/A 10/03/2021   Procedure: PERICARDIOCENTESIS;  Surgeon: Jettie Booze, MD;  Location: [facility];  Service: Cardiovascular;  Laterality: N/A;   PORTA CATH INSERTION N/A 06/06/2021   Procedure: PORTA CATH INSERTION;  Surgeon: Algernon Huxley, MD;  Location: [facility];  Service: Cardiovascular;  Laterality: N/A;    SHOULDER ARTHROSCOPY  10/23/2016   Procedure: ARTHROSCOPY SHOULDER WITH DISTAL CLAVICLE EXCISION, PARTIAL ACROMIONECTOMY, AND DEBRIDEMENT;  Surgeon: Earnestine Leys, MD;  Location: [facility];  Service: Orthopedics;;   TIBIA FRACTURE SURGERY Left 2004   titanium rod, ORIF    Family History  Problem Relation Age of Onset   Heart disease Father    Osteoarthritis Father    Alzheimer's disease Father    Cancer Neg Hx    Diabetes Neg Hx    Hypertension Neg Hx    Stroke Neg Hx    COPD Neg Hx     Social History   Socioeconomic History   Marital status: Married    Spouse name: colleen   Number of children: Not on file   Years of education: 12   Highest education level: Bachelor's degree (e.g., BA, AB, BS)  Occupational History   Occupation: retired  Tobacco Use   Smoking status: Every Day    Packs/day: 0.50    Years: 45.00    Pack years: 22.50    Types: Cigarettes   Smokeless tobacco: Never   Tobacco comments:    11/6- currently smoking 5 cigarettes a day   Vaping Use   Vaping Use: Never used  Substance and Sexual Activity   Alcohol use: Yes    Alcohol/week: 3.0 standard drinks    Types: 3 Glasses of wine per week  Comment: a couple glasses of wine a week   Drug use: No   Sexual activity: Yes  Other Topics Concern   Not on file  Social History Narrative   Consumes ~4 cups of coffee/day   Social Determinants of Health   Financial Resource Strain: Not on file  Food Insecurity: Not on file  Transportation Needs: Not on file  Physical Activity: Not on file  Stress: Not on file  Social Connections: Not on file  Intimate Partner Violence: Not on file    Outpatient Medications Prior to Visit  Medication Sig Dispense Refill   acetaminophen (TYLENOL) 325 MG tablet Take by mouth every 6 (six) hours as needed.     acyclovir (ZOVIRAX) 400 MG tablet Take 1 tablet (400 mg total) by mouth 2 (two) times daily. 180 tablet 1   apixaban (ELIQUIS) 5 MG TABS tablet Take 1 tablet (5  mg total) by mouth 2 (two) times daily. 60 tablet 5   colchicine 0.6 MG tablet Take 1 tablet (0.6 mg total) by mouth 2 (two) times daily. 60 tablet 5   diphenhydrAMINE (BENADRYL) 25 MG tablet Take 25 mg by mouth every 6 (six) hours as needed.     lidocaine-prilocaine (EMLA) cream Apply 30 -45 mins prior to port access. 30 g 0   montelukast (SINGULAIR) 10 MG tablet Take 1 tablet (10 mg total) by mouth at bedtime. Start 2 days prior to infusion. Do not take on the day of infusion. 60 tablet 0   oxyCODONE (OXY IR/ROXICODONE) 5 MG immediate release tablet Take 1 tablet (5 mg total) by mouth every 6 (six) hours as needed for moderate pain or severe pain. 20 tablet 0   pantoprazole (PROTONIX) 40 MG tablet Take 1 tablet (40 mg total) by mouth daily. 90 tablet 3   venetoclax (VENCLEXTA) 100 MG tablet Take 200 mg once a day x 1 week; and then 486m once a day. Tablets should be swallowed whole with a meal and a full glass of water. 62 tablet 0   venetoclax (VENCLEXTA) 50 MG tablet Take 1 tablet (50 mg total) by mouth daily. Tablets should be swallowed whole with a meal and a full glass of water. 7 tablet 0   No facility-administered medications prior to visit.    Allergies  Allergen Reactions   Gluten Meal     Gluten intolerance     ROS Review of Systems  Constitutional:  Negative for fatigue, fever and unexpected weight change.  HENT:  Negative for congestion, ear pain, sinus pressure, sinus pain and trouble swallowing.   Eyes:  Negative for visual disturbance.  Respiratory:  Negative for cough, choking, chest tightness, shortness of breath and wheezing.   Cardiovascular:  Negative for chest pain, palpitations and leg swelling.  Gastrointestinal:  Negative for blood in stool, constipation, diarrhea, nausea and vomiting.  Endocrine: Negative for polydipsia, polyphagia and polyuria.  Genitourinary:  Negative for difficulty urinating, hematuria, penile pain, penile swelling, scrotal swelling and  testicular pain.  Musculoskeletal:  Negative for arthralgias, myalgias, neck pain and neck stiffness.  Skin:  Negative for rash.  Neurological:  Negative for dizziness, weakness, light-headedness, numbness and headaches.  Psychiatric/Behavioral:  Negative for dysphoric mood and sleep disturbance. The patient is not nervous/anxious.      Objective:    Physical Exam Vitals reviewed.  Constitutional:      General: He is awake.     Appearance: Normal appearance. He is well-developed, well-groomed and overweight.  HENT:     Head:  Normocephalic and atraumatic.     Right Ear: Hearing, tympanic membrane, ear canal and external ear normal.     Left Ear: Hearing, tympanic membrane, ear canal and external ear normal.     Mouth/Throat:     Pharynx: Oropharynx is clear. Uvula midline.     Tonsils: No tonsillar exudate or tonsillar abscesses.  Eyes:     General: Lids are normal.     Extraocular Movements: Extraocular movements intact.     Conjunctiva/sclera: Conjunctivae normal.     Pupils: Pupils are equal, round, and reactive to light.  Neck:     Trachea: Trachea and phonation normal.  Cardiovascular:     Rate and Rhythm: Normal rate and regular rhythm.     Pulses: Normal pulses.     Heart sounds: Normal heart sounds.  Pulmonary:     Effort: Pulmonary effort is normal.     Breath sounds: Normal breath sounds and air entry. No decreased breath sounds, wheezing, rhonchi or rales.  Abdominal:     General: Abdomen is flat. Bowel sounds are normal.     Palpations: Abdomen is soft.  Musculoskeletal:     Cervical back: Normal range of motion and neck supple.     Right lower leg: No edema.     Left lower leg: No edema.  Lymphadenopathy:     Head:     Right side of head: No submental or submandibular adenopathy.     Left side of head: No submental or submandibular adenopathy.     Upper Body:     Right upper body: No supraclavicular adenopathy.     Left upper body: No supraclavicular  adenopathy.  Skin:    General: Skin is warm.     Findings: Lesion and rash present. Rash is scaling.     Comments: Multiple scaling rashes/ lesions consistent with psoriasis along bilateral shins    Neurological:     General: No focal deficit present.     Mental Status: He is alert.     Motor: Motor function is intact.  Psychiatric:        Attention and Perception: Attention and perception normal.        Mood and Affect: Mood and affect normal.        Speech: Speech normal.        Behavior: Behavior is cooperative.        Thought Content: Thought content normal.        Cognition and Memory: Cognition normal.    BP 122/78    Pulse (!) 116    Temp 97.6 F (36.4 C) (Oral)    Resp 16    Ht 5' 6"  (1.676 m)    Wt 168 lb 12.8 oz (76.6 kg)    SpO2 98%    BMI 27.25 kg/m  Wt Readings from Last 3 Encounters:  12/19/21 168 lb 12.8 oz (76.6 kg)  12/12/21 171 lb (77.6 kg)  12/09/21 170 lb (77.1 kg)     Health Maintenance Due  Topic Date Due   Zoster Vaccines- Shingrix (1 of 2) Never done   TETANUS/TDAP  05/13/2018   Fecal DNA (Cologuard)  05/28/2020   Pneumonia Vaccine 65+ Years old (2 - PPSV23 if available, else PCV20) 12/10/2020    There are no preventive care reminders to display for this patient.  Lab Results  Component Value Date   TSH 1.237 10/04/2021   Lab Results  Component Value Date   WBC 10.2 12/09/2021   HGB 14.7 12/09/2021  HCT 42.3 12/09/2021   MCV 87.4 12/09/2021   PLT 289 12/09/2021   Lab Results  Component Value Date   NA 136 12/09/2021   K 3.8 12/09/2021   CO2 27 12/09/2021   GLUCOSE 72 12/09/2021   BUN 13 12/09/2021   CREATININE 0.94 12/09/2021   BILITOT 0.4 12/09/2021   ALKPHOS 59 12/09/2021   AST 23 12/09/2021   ALT 42 12/09/2021   PROT 6.4 (L) 12/09/2021   ALBUMIN 4.0 12/09/2021   CALCIUM 9.0 12/09/2021   ANIONGAP 5 12/09/2021   EGFR 95 10/27/2021   Lab Results  Component Value Date   CHOL 194 10/06/2019   Lab Results  Component Value  Date   HDL 45 10/06/2019   Lab Results  Component Value Date   LDLCALC 124 (H) 10/06/2019   Lab Results  Component Value Date   TRIG 136 10/06/2019   Lab Results  Component Value Date   CHOLHDL 4.3 10/06/2019   Lab Results  Component Value Date   HGBA1C 5.5 09/07/2021      Assessment & Plan:   Problem List Items Addressed This Visit       Cardiovascular and Mediastinum   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  Recommend discussion with hematology and oncology team to determine prolonged need after initial 6 months since patient is at increased risk of further events         Musculoskeletal and Integument   Psoriasis    Chronic concern, historic problem Currently patient is using Triamcinolone cream without relief Provided new script for Hydrocortisone valerate 0.2% to assist with resolution Will monitor for response- may need referral to Dermatology for more potent topical steroid recommendation       Relevant Medications   hydrocortisone valerate cream (WESTCORT) 0.2 %     Other   Hyperlipidemia (Chronic)    Unsure of chronicity of condition at this time Patient refused Lipid panel today and states he is skeptical of statins in general Recommend at least yearly monitoring to guide potential lifestyle modifications - will try to impress this upon patient at follow up  At this time recommend physical activity and heart healthy dietary changes to prevent cardiovascular issues.       Vitamin D deficiency disease - Primary    Chronic, historic problem Currently unmanaged  Patient presented today with concerns for Avitaminosis D and requests lab work as well as  supplementation information  Recommended 600-800 units of Vitamin D2 or D3 if lab results indicate deficiency  Recheck in at least 6 months        Relevant Orders   Vitamin D (25 hydroxy)   Tobacco use disorder, continuous    Chronic, historic problem, currently unmanaged Patient states he is gradually reducing cigarettes and is currently down to 5 per day Patient declined discussion and offer of smoking cessation resources today Recommend he continue with reduction efforts  Recommend providers be flexible with cessation methods and offerings to assist with successful quitting.        Other Visit Diagnoses     Need for pneumococcal vaccination       Relevant Orders   Pneumococcal conjugate vaccine 20-valent (Prevnar 20) (Completed)  No follow-ups on file.   I, Yevette Knust E Kieu Quiggle, PA-C, have reviewed all documentation for this visit. The documentation on 12/19/21 for the exam, diagnosis, procedures, and orders are all accurate and complete.   Andres Mullins, [place] MPH [facility]

## 2021-12-19 NOTE — Patient Instructions (Signed)
It was nice to meet you and I appreciate the opportunity to be involved in your care   We are going to check your Vitamin D level today and I will send you a message with supplementation guidelines I will include a plan for monitoring your PE and need for Eliquis in your chart so other providers can follow up if you don't see me again when it's time to re-evaluate

## 2021-12-27 ENCOUNTER — Inpatient Hospital Stay (HOSPITAL_BASED_OUTPATIENT_CLINIC_OR_DEPARTMENT_OTHER): Payer: Medicare PPO | Admitting: Internal Medicine

## 2021-12-27 ENCOUNTER — Encounter: Payer: Self-pay | Admitting: Internal Medicine

## 2021-12-27 ENCOUNTER — Other Ambulatory Visit: Payer: Self-pay

## 2021-12-27 ENCOUNTER — Inpatient Hospital Stay: Payer: Medicare PPO

## 2021-12-27 VITALS — BP 128/82 | HR 75

## 2021-12-27 DIAGNOSIS — D849 Immunodeficiency, unspecified: Secondary | ICD-10-CM | POA: Diagnosis not present

## 2021-12-27 DIAGNOSIS — C9110 Chronic lymphocytic leukemia of B-cell type not having achieved remission: Secondary | ICD-10-CM

## 2021-12-27 DIAGNOSIS — Z298 Encounter for other specified prophylactic measures: Secondary | ICD-10-CM | POA: Diagnosis not present

## 2021-12-27 DIAGNOSIS — I319 Disease of pericardium, unspecified: Secondary | ICD-10-CM | POA: Diagnosis not present

## 2021-12-27 DIAGNOSIS — I2510 Atherosclerotic heart disease of native coronary artery without angina pectoris: Secondary | ICD-10-CM | POA: Diagnosis not present

## 2021-12-27 DIAGNOSIS — F17210 Nicotine dependence, cigarettes, uncomplicated: Secondary | ICD-10-CM | POA: Diagnosis not present

## 2021-12-27 DIAGNOSIS — Z5112 Encounter for antineoplastic immunotherapy: Secondary | ICD-10-CM | POA: Diagnosis not present

## 2021-12-27 DIAGNOSIS — R5383 Other fatigue: Secondary | ICD-10-CM | POA: Diagnosis not present

## 2021-12-27 DIAGNOSIS — E785 Hyperlipidemia, unspecified: Secondary | ICD-10-CM | POA: Diagnosis not present

## 2021-12-27 LAB — CBC WITH DIFFERENTIAL/PLATELET
Abs Immature Granulocytes: 0.05 10*3/uL (ref 0.00–0.07)
Basophils Absolute: 0.1 10*3/uL (ref 0.0–0.1)
Basophils Relative: 1 %
Eosinophils Absolute: 0.1 10*3/uL (ref 0.0–0.5)
Eosinophils Relative: 1 %
HCT: 44.7 % (ref 39.0–52.0)
Hemoglobin: 15.3 g/dL (ref 13.0–17.0)
Immature Granulocytes: 1 %
Lymphocytes Relative: 25 %
Lymphs Abs: 1.6 10*3/uL (ref 0.7–4.0)
MCH: 30.2 pg (ref 26.0–34.0)
MCHC: 34.2 g/dL (ref 30.0–36.0)
MCV: 88.2 fL (ref 80.0–100.0)
Monocytes Absolute: 0.6 10*3/uL (ref 0.1–1.0)
Monocytes Relative: 9 %
Neutro Abs: 4.1 10*3/uL (ref 1.7–7.7)
Neutrophils Relative %: 63 %
Platelets: 276 10*3/uL (ref 150–400)
RBC: 5.07 MIL/uL (ref 4.22–5.81)
RDW: 14.6 % (ref 11.5–15.5)
WBC: 6.4 10*3/uL (ref 4.0–10.5)
nRBC: 0 % (ref 0.0–0.2)

## 2021-12-27 LAB — COMPREHENSIVE METABOLIC PANEL
ALT: 37 U/L (ref 0–44)
AST: 25 U/L (ref 15–41)
Albumin: 3.9 g/dL (ref 3.5–5.0)
Alkaline Phosphatase: 65 U/L (ref 38–126)
Anion gap: 8 (ref 5–15)
BUN: 10 mg/dL (ref 8–23)
CO2: 25 mmol/L (ref 22–32)
Calcium: 9.2 mg/dL (ref 8.9–10.3)
Chloride: 105 mmol/L (ref 98–111)
Creatinine, Ser: 0.96 mg/dL (ref 0.61–1.24)
GFR, Estimated: >60 mL/min (ref >60)
Glucose, Bld: 97 mg/dL (ref 70–99)
Potassium: 3.8 mmol/L (ref 3.5–5.1)
Sodium: 138 mmol/L (ref 135–145)
Total Bilirubin: 0.4 mg/dL (ref 0.3–1.2)
Total Protein: 6.4 g/dL — ABNORMAL LOW (ref 6.5–8.1)

## 2021-12-27 LAB — PHOSPHORUS: Phosphorus: 3.5 mg/dL (ref 2.5–4.6)

## 2021-12-27 LAB — LACTATE DEHYDROGENASE: LDH: 128 U/L (ref 98–192)

## 2021-12-27 LAB — MAGNESIUM: Magnesium: 1.8 mg/dL (ref 1.7–2.4)

## 2021-12-27 MED ORDER — SODIUM CHLORIDE 0.9 % IV SOLN
20.0000 mg | Freq: Once | INTRAVENOUS | Status: AC
  Administered 2021-12-27: 20 mg via INTRAVENOUS
  Filled 2021-12-27: qty 20

## 2021-12-27 MED ORDER — ACETAMINOPHEN 325 MG PO TABS
650.0000 mg | ORAL_TABLET | Freq: Once | ORAL | Status: AC
  Administered 2021-12-27: 650 mg via ORAL
  Filled 2021-12-27: qty 2

## 2021-12-27 MED ORDER — HEPARIN SOD (PORK) LOCK FLUSH 100 UNIT/ML IV SOLN
500.0000 [IU] | Freq: Once | INTRAVENOUS | Status: AC | PRN
  Administered 2021-12-27: 500 [IU]
  Filled 2021-12-27: qty 5

## 2021-12-27 MED ORDER — DIPHENHYDRAMINE HCL 50 MG/ML IJ SOLN
50.0000 mg | Freq: Once | INTRAMUSCULAR | Status: AC
  Administered 2021-12-27: 50 mg via INTRAVENOUS
  Filled 2021-12-27: qty 1

## 2021-12-27 MED ORDER — SODIUM CHLORIDE 0.9 % IV SOLN
Freq: Once | INTRAVENOUS | Status: AC
  Filled 2021-12-27: qty 250

## 2021-12-27 MED ORDER — SODIUM CHLORIDE 0.9 % IV SOLN
1000.0000 mg | Freq: Once | INTRAVENOUS | Status: AC
  Administered 2021-12-27: 1000 mg via INTRAVENOUS
  Filled 2021-12-27: qty 40

## 2021-12-27 NOTE — Assessment & Plan Note (Addendum)
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 Currently after 5 cycles of Gazyva+ venetoclax.   # Proceed with cycle #6 of Gazyva. Labs today reviewed;  acceptable for treatment today. currenty on cycle #1 currently on venatoclax 400 mg once a day; No evidence of tumor lysis.  # Pericardial effusion/pericarditis-unclear etiology question pneumonia versus others-s/p prednisone taper.  Monitor closely. STABLE.   # PE acute [NOV [id number]]provoked--currently on Eliquis. Continue  Eliquis x 6 months or so.  # Continue  shingles/TLS prophylaxis: Acyclovir-STABLE.  # EVUSHLED: s/p  COVID EVUSHELD PROPHYLAXIS x2 [jan 28115]   # secondary cancers: recommend dermatology evaluation. Will check with insurance re: in-network provider.   # Mediport placement: No malfunction noted- Discussed re: pro and cons of keeping the port vs. Explantation.  # DISPOSITION: # GDyann Kieftoday # follow up in 4 weeks- MD; labs- cbc/cmp/LDH; port flush;--Dr.B

## 2021-12-27 NOTE — Progress Notes (Signed)
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   AUG 2021-Head and neck: The brain demonstrates symmetric appearing diffuse physiologic uptake without focal finding to suggest abnormality. In the left posterior and medial maxillary cavity, there is an area of mucoid deposition seen within SUV max of 28.8. This was seen in the previous study of November. The specific bony erosion, clear-cut soft tissue lesion is not detected. This probably represents a chronic sinus disease focus. The bilateral optic nerves and extraocular muscles to the largest nodes are seen on axial image 98. 1 now demonstrates a short axis measurement of 3.3 cm and the other more medially, a short axis measurement of 2.5 cm. Demonstrate symmetric increased uptake. The left, much greater than right, jugulodigastric and supraclavicular nodes have increased significantly in size and avidity since the previous study. Both of these lymph nodes demonstrate an SUV max of 3.9. No largest lymph node in the right subclavicular space is seen on axial image 104, demonstrating an SUV max of 3.3, and a short axis measurement of 1.4 cm.   On axial image 102, a right paratracheal lymph node is again identified with increased avidity,  located immediately posterior to the right thyroid pole. This area was noted previously, but now increased avidity is seen, with an SUV max of 8.6 in this area. Multiple smaller, and less avid lymph nodes scattered throughout the left greater than right neck are seen, as well as supraclavicular and infraclavicular areas.   Thorax: Significant axillary lymph nodes have increased significantly in size. These are also left greater than right, with the largest on the left seen on image #114 with a short axis measurement of 2.2 cm and an SUV max of 4.3. The largest on the right as seen on image 111, demonstrating a short axis of 1.4 cm and an SUV max of 4.0. Too numerous to count smaller and similarly avid lymph nodes in both axillae are also noted. These are all larger than previously seen and in general, more avid.  Numerous small AP window and lesser mediastinal lymph nodes are seen with minimal increased avidity, appearing similar in size and avidity to the previous study. No other significant mediastinal adenopathy. No hilar adenopathy detected. These likely also represent lymphoproliferative disease.  Hyperinflation of COPD, bibasilar dependent atelectasis, some paucity of lung tissue and biapical scarring are noted without other focal pulmonary mass or nodule, or acute pulmonary parenchymal disease. Physiologic uptake in the left heart is seen, as expected. Small hiatal hernia is again identified.   Abdomen pelvis: Physiologic uptake in the liver, gut and urinary tract is again noted. No focal area of asymmetric uptake in the structures is seen. The spleen also demonstrates generalized increased uptake and stable splenomegaly, up to 14.2 cm. On image 198, a preaortic lymph node is detected of increased size since the previous study, now measuring  up to 1.2 cm short axis.  Several large lymph nodes on image 203 of the mesentery are noted. The largest of these, on image 201 and ureters up to 1.8 cm short  axis. Despite the fact that measurable avidity is not detected in these are the surrounding lymph nodes, these are doubtless involved in the lymphoproliferative disease described, and appear larger. Multiple smaller lymph nodes scattered throughout the mesentery are also seen.  Grossly enlarged right greater than left common iliac chain lymph nodes are again seen, throughout the pelvis and into the inguinal areas bilaterally. The most avid on the right is seen on image #260 to, demonstrates a short axis of 2.5 cm, and an SUV max of 4.9. The largest on the left is identified image #272, demonstrates a short axis of 2.0 cm, and an SUV max of 4.7. These are similar to the previously noted values.  Right greater than left inguinal adenopathy is seen, with the largest lymph node seen on the right image 276, demonstrating a short axis of 1.5 cm and an SUV max of 3.9. This is larger in size and avidity than previously seen. There are bilateral enlarged lymph nodes associated with the external iliac vessels on image 277. These measure 2.0 cm on the right in short axis, with an SUV max of 3.9.  On the left, image 278, the external iliac lymph node is larger at 1.8 cm short axis, but with an SUV max of only 3.8. Lesser avid and smaller lymph nodes on both sides are also detected.   Musculoskeletal: Scattered, insignificant uptake is detected in the muscles especially in the bilateral joints, without focal finding to suggest lymphoproliferative disease.   IMPRESSIONS:  Significant increase in numbers, short axis size, and to a large extent, avidity -in the lymph nodes of the left greater than right neck, supraclavicular and infraclavicular areas, bilateral axillary lymph nodes and common iliac chain, external iliac chain and inguinal lymph nodes, consistent with worsening lymphoproliferative disease. The mediastinal lymph nodes seen are still present and unchanged in size or [significantly] in avidity.    [place]-  Bx- SLL/CLL.July 2022-Discussed with Dr. Asquith-oncologist, [place]-reviewed the pathology of the lymph node biopsy in 2021-positive for SLL; negative for cyclin D1/1114 translocation.    # July 20th, 2022- GAZYVA #1 Aletha Halim plan ading VENATOCLAX after 2 cycles/de-bulking]  # Lung cancer screening- on [facility]  # SURVIVORSHIP:   # GENETICS:   DIAGNOSIS:   STAGE:         ;  GOALS:  CURRENT/MOST RECENT THERAPY :       CLL (chronic lymphocytic leukemia) ([place])  10/29/2019 Initial Diagnosis   CLL (chronic lymphocytic leukemia) ([place])   06/15/2021 -  Chemotherapy   Patient is on Treatment Plan : LYMPHOMA CLL/SLL Venetoclax + Obinutuzumab q28d        INTERVAL HISTORY: Ambulating independently.  Alone.  A pleasant 71 year-old male patient with CLL/SLL -stage IV bulky adenopathy-currently s/p cycle #5-+ venatoclax [100 mg/day-cycle #1]  Patient denies any worsening leg swelling.  Denies any new lumps or bumps.  Appetite is good.  No weight loss.  In regards to pericarditis he is currently tapered of the steroids.  Continues with colchicine.   Review of Systems  Constitutional:  Positive for malaise/fatigue. Negative for chills, diaphoresis, fever and weight loss.  HENT:  Negative for nosebleeds and sore throat.   Eyes:  Negative for double vision.  Respiratory:  Negative for cough, hemoptysis, sputum production, shortness of breath and wheezing.  Cardiovascular:  Negative for chest pain, palpitations, orthopnea and leg swelling.  Gastrointestinal:  Negative for abdominal pain, blood in stool, constipation, diarrhea, heartburn, melena, nausea and vomiting.  Genitourinary:  Negative for dysuria, frequency and urgency.  Musculoskeletal:  Negative for back pain and joint pain.  Skin: Negative.  Negative for itching and rash.  Neurological:  Negative for dizziness, tingling, focal weakness, weakness and headaches.  Endo/Heme/Allergies:  Does not bruise/bleed easily.  Psychiatric/Behavioral:   Negative for depression. The patient is not nervous/anxious and does not have insomnia.     PAST MEDICAL HISTORY :  Past Medical History:  Diagnosis Date   Atherosclerosis of coronary artery 09/28/2016   Chest CT Nov 2017   Atrial tachycardia (HCC)    CLL (chronic lymphocytic leukemia) (HCC)    Elevated C-reactive protein (CRP)    Elevated PSA    Fractured sternum 1999   Herpes    History of rib fracture 1999   multiple   Hyperlipidemia    Psoriasis    Rheumatoid factor positive 02/2014   Rosacea    Thoracic aortic atherosclerosis ([place]) 09/28/2016   Chest CT Nov 2017   Tobacco abuse    Vitamin D deficiency disease     PAST SURGICAL HISTORY :   Past Surgical History:  Procedure Laterality Date   APPENDECTOMY  1974   PERICARDIOCENTESIS N/A 10/03/2021   Procedure: PERICARDIOCENTESIS;  Surgeon: Jettie Booze, MD;  Location: [facility];  Service: Cardiovascular;  Laterality: N/A;   PORTA CATH INSERTION N/A 06/06/2021   Procedure: PORTA CATH INSERTION;  Surgeon: Algernon Huxley, MD;  Location: [facility];  Service: Cardiovascular;  Laterality: N/A;   SHOULDER ARTHROSCOPY  10/23/2016   Procedure: ARTHROSCOPY SHOULDER WITH DISTAL CLAVICLE EXCISION, PARTIAL ACROMIONECTOMY, AND DEBRIDEMENT;  Surgeon: Earnestine Leys, MD;  Location: [facility];  Service: Orthopedics;;   TIBIA FRACTURE SURGERY Left 2004   titanium rod, ORIF    FAMILY HISTORY :   Family History  Problem Relation Age of Onset   Heart disease Father    Osteoarthritis Father    Alzheimer's disease Father    Cancer Neg Hx    Diabetes Neg Hx    Hypertension Neg Hx    Stroke Neg Hx    COPD Neg Hx     SOCIAL HISTORY:   Social History   Tobacco Use   Smoking status: Every Day    Packs/day: 0.50    Years: 45.00    Pack years: 22.50    Types: Cigarettes   Smokeless tobacco: Never   Tobacco comments:    11/6- currently smoking 5 cigarettes a day   Vaping Use   Vaping Use: Never used   Substance Use Topics   Alcohol use: Yes    Alcohol/week: 3.0 standard drinks    Types: 3 Glasses of wine per week    Comment: a couple glasses of wine a week   Drug use: No    ALLERGIES:  is allergic to gluten meal.  MEDICATIONS:  Current Outpatient Medications  Medication Sig Dispense Refill   acetaminophen (TYLENOL) 325 MG tablet Take by mouth every 6 (six) hours as needed.     acyclovir (ZOVIRAX) 400 MG tablet Take 1 tablet (400 mg total) by mouth 2 (two) times daily. 180 tablet 1   apixaban (ELIQUIS) 5 MG TABS tablet Take 1 tablet (5 mg total) by mouth 2 (two) times daily. 60 tablet 5   colchicine 0.6 MG tablet Take 1 tablet (0.6 mg  total) by mouth 2 (two) times daily. 60 tablet 5   diphenhydrAMINE (BENADRYL) 25 MG tablet Take 25 mg by mouth every 6 (six) hours as needed.     hydrocortisone valerate cream (WESTCORT) 0.2 % Apply 1 application topically 2 (two) times daily. 45 g 0   lidocaine-prilocaine (EMLA) cream Apply 30 -45 mins prior to port access. 30 g 0   montelukast (SINGULAIR) 10 MG tablet Take 1 tablet (10 mg total) by mouth at bedtime. Start 2 days prior to infusion. Do not take on the day of infusion. 60 tablet 0   oxyCODONE (OXY IR/ROXICODONE) 5 MG immediate release tablet Take 1 tablet (5 mg total) by mouth every 6 (six) hours as needed for moderate pain or severe pain. 20 tablet 0   pantoprazole (PROTONIX) 40 MG tablet Take 1 tablet (40 mg total) by mouth daily. 90 tablet 3   venetoclax (VENCLEXTA) 100 MG tablet Take 200 mg once a day x 1 week; and then 443m once a day. Tablets should be swallowed whole with a meal and a full glass of water. 62 tablet 0   No current facility-administered medications for this visit.   Facility-Administered Medications Ordered in Other Visits  Medication Dose Route Frequency Provider Last Rate Last Admin   heparin lock flush 100 unit/mL  500 Units Intracatheter Once PRN BCammie Sickle MD        PHYSICAL EXAMINATION: ECOG  PERFORMANCE STATUS: 0 - Asymptomatic  BP 118/88 (BP Location: Left Arm, Patient Position: Sitting, Cuff Size: Normal)    Pulse 88    Temp (!) 95.3 F (35.2 C) (Tympanic)    Ht 5' 6"  (1.676 m)    Wt 168 lb 12.8 oz (76.6 kg)    SpO2 99%    BMI 27.25 kg/m   Filed Weights   12/27/21 0837  Weight: 168 lb 12.8 oz (76.6 kg)    No significant lymphadenopathy lymphadenopathy in the neck/underarm.  Physical Exam HENT:     Head: Normocephalic and atraumatic.     Mouth/Throat:     Pharynx: No oropharyngeal exudate.  Eyes:     Pupils: Pupils are equal, round, and reactive to light.  Cardiovascular:     Rate and Rhythm: Normal rate and regular rhythm.  Pulmonary:     Effort: No respiratory distress.     Breath sounds: No wheezing.  Abdominal:     General: Bowel sounds are normal. There is no distension.     Palpations: Abdomen is soft. There is no mass.     Tenderness: There is no abdominal tenderness. There is no guarding or rebound.  Musculoskeletal:        General: No tenderness. Normal range of motion.     Cervical back: Normal range of motion and neck supple.  Skin:    General: Skin is warm.  Neurological:     Mental Status: He is alert and oriented to person, place, and time.  Psychiatric:        Mood and Affect: Affect normal.    LABORATORY DATA:  I have reviewed the data as listed    Component Value Date/Time   NA 138 12/27/2021 0824   NA 139 10/27/2021 0857   K 3.8 12/27/2021 0824   CL 105 12/27/2021 0824   CO2 25 12/27/2021 0824   GLUCOSE 97 12/27/2021 0824   BUN 10 12/27/2021 0824   BUN 16 10/27/2021 0857   CREATININE 0.96 12/27/2021 0824   CREATININE 0.84 10/06/2019 0000   CALCIUM  9.2 12/27/2021 0824   PROT 6.4 (L) 12/27/2021 0824   ALBUMIN 3.9 12/27/2021 0824   AST 25 12/27/2021 0824   ALT 37 12/27/2021 0824   ALKPHOS 65 12/27/2021 0824   BILITOT 0.4 12/27/2021 0824   GFRNONAA >60 12/27/2021 0824   GFRNONAA 91 10/06/2019 0000   GFRAA >60 12/26/2019 0842    GFRAA 106 10/06/2019 0000    No results found for: SPEP, UPEP  Lab Results  Component Value Date   WBC 6.4 12/27/2021   NEUTROABS 4.1 12/27/2021   HGB 15.3 12/27/2021   HCT 44.7 12/27/2021   MCV 88.2 12/27/2021   PLT 276 12/27/2021      Chemistry      Component Value Date/Time   NA 138 12/27/2021 0824   NA 139 10/27/2021 0857   K 3.8 12/27/2021 0824   CL 105 12/27/2021 0824   CO2 25 12/27/2021 0824   BUN 10 12/27/2021 0824   BUN 16 10/27/2021 0857   CREATININE 0.96 12/27/2021 0824   CREATININE 0.84 10/06/2019 0000      Component Value Date/Time   CALCIUM 9.2 12/27/2021 0824   ALKPHOS 65 12/27/2021 0824   AST 25 12/27/2021 0824   ALT 37 12/27/2021 0824   BILITOT 0.4 12/27/2021 0824       RADIOGRAPHIC STUDIES: I have personally reviewed the radiological images as listed and agreed with the findings in the report. No results found.   ASSESSMENT & PLAN:   .CLL (chronic lymphocytic leukemia) ([place]) # SLL/CLL-  currently on  Gazyva x 6 cycles + venatoclax x12m However therapy interrupted-because of pneumonia/pericarditis [see below]. Currently after 5 cycles of Gazyva+ venetoclax.   # Proceed with cycle #6 of Gazyva. Labs today reviewed;  acceptable for treatment today. currenty on cycle #1 currently on venatoclax 400 mg once a day; No evidence of tumor lysis.  # Pericardial effusion/pericarditis-unclear etiology question pneumonia versus others-s/p prednisone taper.  Monitor closely. STABLE.   # PE acute [NOV 22355]provoked--currently on Eliquis. Continue  Eliquis x 6 months or so.  # Continue  shingles/TLS prophylaxis: Acyclovir-STABLE.  # EVUSHLED: s/p  COVID EVUSHELD PROPHYLAXIS x2 [jan 27322]   # secondary cancers: recommend dermatology evaluation. Will check with insurance re: in-network provider.   # Mediport placement: No malfunction noted- Discussed re: pro and cons of keeping the port vs. Explantation.  # DISPOSITION: # GDyann Kieftoday # follow up in  4 weeks- MD; labs- cbc/cmp/LDH; port flush;--Dr.B            Orders Placed This Encounter  Procedures   CBC with Differential/Platelet    Standing Status:   Future    Standing Expiration Date:   12/27/2022   Comprehensive metabolic panel    Standing Status:   Future    Standing Expiration Date:   12/27/2022   Lactate dehydrogenase    Standing Status:   Future    Standing Expiration Date:   12/27/2022            GCammie Sickle MD 12/27/2021 1:02 PM

## 2021-12-27 NOTE — Patient Instructions (Signed)
[facility] [place]  Discharge Instructions: Thank you for choosing [facility] to provide your oncology and hematology care.  If you have a lab appointment with the [facility], please go directly to the [facility] and check in at the registration area.  Wear comfortable clothing and clothing appropriate for easy access to any Portacath or PICC line.   We strive to give you quality time with your provider. You may need to reschedule your appointment if you arrive late (15 or more minutes).  Arriving late affects you and other patients whose appointments are after yours.  Also, if you miss three or more appointments without notifying the office, you may be dismissed from the clinic at the providers discretion.      For prescription refill requests, have your pharmacy contact our office and allow 72 hours for refills to be completed.     To help prevent nausea and vomiting after your treatment, we encourage you to take your nausea medication as directed.  BELOW ARE SYMPTOMS THAT SHOULD BE REPORTED IMMEDIATELY: *FEVER GREATER THAN 100.4 F (38 C) OR HIGHER *CHILLS OR SWEATING *NAUSEA AND VOMITING THAT IS NOT CONTROLLED WITH YOUR NAUSEA MEDICATION *UNUSUAL SHORTNESS OF BREATH *UNUSUAL BRUISING OR BLEEDING *URINARY PROBLEMS (pain or burning when urinating, or frequent urination) *BOWEL PROBLEMS (unusual diarrhea, constipation, pain near the anus) TENDERNESS IN MOUTH AND THROAT WITH OR WITHOUT PRESENCE OF ULCERS (sore throat, sores in mouth, or a toothache) UNUSUAL RASH, SWELLING OR PAIN  UNUSUAL VAGINAL DISCHARGE OR ITCHING   Items with * indicate a potential emergency and should be followed up as soon as possible or go to the Emergency Department if any problems should occur.  Please show the CHEMOTHERAPY ALERT CARD or IMMUNOTHERAPY ALERT CARD at check-in to the Emergency Department and triage nurse.  Should you have questions after your visit or  need to cancel or reschedule your appointment, please contact [facility] [place] [facility] [place]  [phone] and follow the prompts.  Office hours are 8:00 a.m. to 4:30 p.m. Monday - Friday. Please note that voicemails left after 4:00 p.m. may not be returned until the following business day.  We are closed weekends and major holidays. You have access to a nurse at all times for urgent questions. Please call the main number to the clinic [phone] and follow the prompts.  For any non-urgent questions, you may also contact your provider using MyChart. We now offer e-Visits for anyone 69 and older to request care online for non-urgent symptoms. For details visit [email or web address].   Also download the MyChart app! Go to the app store, search "MyChart", open the app, select [facility], and log in with your MyChart username and password.  Due to Covid, a mask is required upon entering the hospital/clinic. If you do not have a mask, one will be given to you upon arrival. For doctor visits, patients may have 1 support person aged 63 or older with them. For treatment visits, patients cannot have anyone with them due to current Covid guidelines and our immunocompromised population.

## 2021-12-30 ENCOUNTER — Other Ambulatory Visit: Payer: Self-pay | Admitting: Internal Medicine

## 2021-12-30 ENCOUNTER — Other Ambulatory Visit (HOSPITAL_COMMUNITY): Payer: Self-pay

## 2021-12-30 DIAGNOSIS — C9110 Chronic lymphocytic leukemia of B-cell type not having achieved remission: Secondary | ICD-10-CM

## 2021-12-30 MED ORDER — VENETOCLAX 100 MG PO TABS
400.0000 mg | ORAL_TABLET | Freq: Every day | ORAL | 1 refills | Status: DC
  Filled 2021-12-30: qty 120, 30d supply, fill #0

## 2022-01-05 ENCOUNTER — Encounter: Payer: Self-pay | Admitting: Emergency Medicine

## 2022-01-05 ENCOUNTER — Emergency Department
Admission: EM | Admit: 2022-01-05 | Discharge: 2022-01-05 | Disposition: A | Discharge status: Home / Self Care | Payer: Medicare PPO | Attending: Emergency Medicine | Admitting: Emergency Medicine

## 2022-01-05 ENCOUNTER — Emergency Department: Payer: Medicare PPO

## 2022-01-05 ENCOUNTER — Other Ambulatory Visit: Payer: Self-pay

## 2022-01-05 ENCOUNTER — Telehealth: Payer: Self-pay | Admitting: *Deleted

## 2022-01-05 DIAGNOSIS — R079 Chest pain, unspecified: Secondary | ICD-10-CM | POA: Diagnosis not present

## 2022-01-05 DIAGNOSIS — R0789 Other chest pain: Secondary | ICD-10-CM | POA: Diagnosis not present

## 2022-01-05 DIAGNOSIS — Z86711 Personal history of pulmonary embolism: Secondary | ICD-10-CM | POA: Insufficient documentation

## 2022-01-05 DIAGNOSIS — J90 Pleural effusion, not elsewhere classified: Secondary | ICD-10-CM | POA: Diagnosis not present

## 2022-01-05 DIAGNOSIS — R911 Solitary pulmonary nodule: Secondary | ICD-10-CM | POA: Diagnosis not present

## 2022-01-05 DIAGNOSIS — R0602 Shortness of breath: Secondary | ICD-10-CM | POA: Diagnosis not present

## 2022-01-05 DIAGNOSIS — Z7901 Long term (current) use of anticoagulants: Secondary | ICD-10-CM | POA: Insufficient documentation

## 2022-01-05 DIAGNOSIS — R000 Tachycardia, unspecified: Secondary | ICD-10-CM | POA: Diagnosis not present

## 2022-01-05 DIAGNOSIS — Z20822 Contact with and (suspected) exposure to covid-19: Secondary | ICD-10-CM | POA: Diagnosis not present

## 2022-01-05 DIAGNOSIS — I3139 Other pericardial effusion (noninflammatory): Secondary | ICD-10-CM | POA: Diagnosis not present

## 2022-01-05 DIAGNOSIS — J439 Emphysema, unspecified: Secondary | ICD-10-CM | POA: Diagnosis not present

## 2022-01-05 DIAGNOSIS — J9811 Atelectasis: Secondary | ICD-10-CM | POA: Diagnosis not present

## 2022-01-05 LAB — BASIC METABOLIC PANEL
Anion gap: 10 (ref 5–15)
BUN: 13 mg/dL (ref 8–23)
CO2: 25 mmol/L (ref 22–32)
Calcium: 9.4 mg/dL (ref 8.9–10.3)
Chloride: 102 mmol/L (ref 98–111)
Creatinine, Ser: 0.79 mg/dL (ref 0.61–1.24)
GFR, Estimated: >60 mL/min (ref >60)
Glucose, Bld: 122 mg/dL — ABNORMAL HIGH (ref 70–99)
Potassium: 3.8 mmol/L (ref 3.5–5.1)
Sodium: 137 mmol/L (ref 135–145)

## 2022-01-05 LAB — CBC
HCT: 46 % (ref 39.0–52.0)
Hemoglobin: 15.9 g/dL (ref 13.0–17.0)
MCH: 30.2 pg (ref 26.0–34.0)
MCHC: 34.6 g/dL (ref 30.0–36.0)
MCV: 87.3 fL (ref 80.0–100.0)
Platelets: 276 10*3/uL (ref 150–400)
RBC: 5.27 MIL/uL (ref 4.22–5.81)
RDW: 14.6 % (ref 11.5–15.5)
WBC: 13.4 10*3/uL — ABNORMAL HIGH (ref 4.0–10.5)
nRBC: 0 % (ref 0.0–0.2)

## 2022-01-05 LAB — RESP PANEL BY RT-PCR (FLU A&B, COVID) ARPGX2
Influenza A by PCR: NEGATIVE
Influenza B by PCR: NEGATIVE
SARS Coronavirus 2 by RT PCR: NEGATIVE

## 2022-01-05 LAB — TROPONIN I (HIGH SENSITIVITY)
Troponin I (High Sensitivity): 5 ng/L (ref <18)
Troponin I (High Sensitivity): 5 ng/L (ref <18)

## 2022-01-05 LAB — D-DIMER, QUANTITATIVE: D-Dimer, Quant: 0.27 ug/mL-FEU (ref 0.00–0.50)

## 2022-01-05 MED ORDER — LIDOCAINE VISCOUS HCL 2 % MT SOLN
15.0000 mL | Freq: Once | OROMUCOSAL | Status: AC
  Administered 2022-01-05: 15 mL via ORAL
  Filled 2022-01-05: qty 15

## 2022-01-05 MED ORDER — ACETAMINOPHEN 500 MG PO TABS
1000.0000 mg | ORAL_TABLET | Freq: Once | ORAL | Status: AC
Start: 2022-01-05 — End: 2022-01-05
  Administered 2022-01-05: 1000 mg via ORAL
  Filled 2022-01-05: qty 2

## 2022-01-05 MED ORDER — LIDOCAINE 5 % EX PTCH: 1.0000 | MEDICATED_PATCH | Freq: Two times a day (BID) | CUTANEOUS | 0 refills | Status: DC

## 2022-01-05 MED ORDER — ALUM & MAG HYDROXIDE-SIMETH 200-200-20 MG/5ML PO SUSP
30.0000 mL | Freq: Once | ORAL | Status: AC
  Administered 2022-01-05: 30 mL via ORAL
  Filled 2022-01-05: qty 30

## 2022-01-05 NOTE — Telephone Encounter (Signed)
Called patient back and told him NP recommendation, he sated he is on the way to the ER

## 2022-01-05 NOTE — Discharge Instructions (Signed)
Use Tylenol for pain and fevers.  Up to 1000 mg per dose, up to 4 times per day.  Do not take more than 4000 mg of Tylenol/acetaminophen within 24 hours..  Please use lidocaine patches at your site of pain.  Apply 1 patch at a time, leave on for 12 hours, then remove for 12 hours.  12 hours on, 12 hours off.  Do not apply more than 1 patch at a time.

## 2022-01-05 NOTE — Telephone Encounter (Signed)
Patient called reporting that he is having tightness in his chest and that he is tachycardic in the 180's like when he had pneumonia. He denies shortness of breath or "pain" just discomfort. And he is not having any at this particular time. He is asking to come in to be evaluated. Please advise.

## 2022-01-05 NOTE — ED Provider Notes (Signed)
[facility] Provider Note    Event Date/Time   First MD Initiated Contact with Patient 01/05/22 1041     (approximate)   History   Chest Pain   HPI  Andres Mullins is a 71 y.o. male who presents to the ED for evaluation of Chest Pain   I reviewed outpatient oncology visit from 1/31.  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  Sixth cycle of Gazyva.  He is on Eliquis due to acute PE in November 2022.  Pericardial effusion and possible pericarditis.  S/p drainage of pericardial effusion in November 2022, negative cytology for malignancy and thought to be due to pericarditis.  Follow-up ultrasound a couple months ago with a small effusion.  Patient presents to the ED, accompanied by his wife, for evaluation of right-sided chest discomfort over the past 2 days with the explicit concern for pneumonia.  Patient and wife report concern that this is a similar progression leading up to when he was diagnosed with pneumonia few months ago.  He denies any cough, fever.  Denies any worsening of his shortness of breath.  Reports right-sided chest discomfort that he attributes to eating a hard-boiled egg quickly after being cooked while still hot.   Physical Exam   Triage Vital Signs: ED Triage Vitals  Enc Vitals Group     BP 01/05/22 1015 (!) 123/95     Pulse Rate 01/05/22 1015 (!) 109     Resp 01/05/22 1015 20     Temp 01/05/22 1015 98.1 F (36.7 C)     Temp Source 01/05/22 1015 Oral     SpO2 01/05/22 1015 95 %     Weight 01/05/22 1013 168 lb 12.8 oz (76.6 kg)     Height 01/05/22 1013 5' 6"  (1.676 m)     Head Circumference --      Peak Flow --      Pain Score 01/05/22 1013 3     Pain Loc --      Pain Edu? --      Excl. in [place]? --     Most recent vital signs: Vitals:   01/05/22 1015  BP: (!) 123/95  Pulse: (!) 109  Resp: 20  Temp: 98.1 F (36.7 C)  SpO2: 95%    General: Awake, no distress.  Swiftly ambulatory with normal gait.  Looks well. CV:  Good  peripheral perfusion. RRR Resp:  Normal effort.  CTAB Abd:  No distention.  MSK:  No deformity noted.  Port to the right chest Neuro:  No focal deficits appreciated. Other:     ED Results / Procedures / Treatments   Labs (all labs ordered are listed, but only abnormal results are displayed) Labs Reviewed  BASIC METABOLIC PANEL - Abnormal; Notable for the following components:      Result Value   Glucose, Bld 122 (*)    All other components within normal limits  CBC - Abnormal; Notable for the following components:   WBC 13.4 (*)    All other components within normal limits  RESP PANEL BY RT-PCR (FLU A&B, COVID) [id number]  D-DIMER, QUANTITATIVE  TROPONIN I (HIGH SENSITIVITY)  TROPONIN I (HIGH SENSITIVITY)    EKG Sinus tachycardia with a rate of 114 bpm.  Normal axis.  Incomplete right bundle.  T wave inversions to aVL.  No STEMI.  RADIOLOGY CXR reviewed by me without evidence of acute cardiopulmonary pathology.  Official radiology report(s): DG Chest 2 View  Result Date: 01/05/2022 CLINICAL DATA:  Chest pain EXAM: CHEST - 2 VIEW COMPARISON:  Chest x-ray dated October 24, 2021 FINDINGS: Right chest wall port with tip in the superior cavoatrial junction. Cardiac and mediastinal contours within normal limits. Mild bibasilar atelectasis. Lungs otherwise clear. No pleural effusion or pneumothorax. IMPRESSION: No active cardiopulmonary disease. Electronically Signed   By: Yetta Glassman M.D.   On: 01/05/2022 10:59   CT Chest Wo Contrast  Result Date: 01/05/2022 CLINICAL DATA:  Right-sided chest pain and tachycardia. Denies shortness of breath. History of leukemia. EXAM: CT CHEST WITHOUT CONTRAST TECHNIQUE: Multidetector CT imaging of the chest was performed following the standard protocol without IV contrast. RADIATION DOSE REDUCTION: This exam was performed according to the departmental dose-optimization program which includes automated exposure control, adjustment of the mA and/or kV  according to patient size and/or use of iterative reconstruction technique. COMPARISON:  CTA chest dated October 16, 2021. FINDINGS: Cardiovascular: Normal heart size. Unchanged small pericardial effusion. No thoracic aortic aneurysm. Unchanged right chest wall port catheter with tip at the cavoatrial junction. Mediastinum/Nodes: Decreasing bilateral axillary lymphadenopathy. Index nodule in the left axilla currently measures 1.1 cm in short axis (series 2, image 29), previously 1.5 cm. No enlarged mediastinal lymph nodes. The thyroid gland, trachea, and esophagus demonstrate no significant findings. Lungs/Pleura: Unchanged mild paraseptal and centrilobular emphysema. Resolved left pleural effusion with improved aeration at the left lower lobe. No consolidation or pneumothorax. Upper Abdomen: No acute abnormality. Musculoskeletal: No acute or significant osseous findings. Old sternal body fracture again noted. IMPRESSION: 1.  No acute intrathoracic process. 2. Decreasing bilateral axillary lymphadenopathy. 3. Resolved left pleural effusion. Unchanged small pericardial effusion. 4. Emphysema (ICD10-J43.9). Electronically Signed   By: [name] Dubin M.D.   On: 01/05/2022 12:32    PROCEDURES and INTERVENTIONS:  Procedures  Medications  acetaminophen (TYLENOL) tablet 1,000 mg (1,000 mg Oral Given 01/05/22 1143)  alum & mag hydroxide-simeth (MAALOX/MYLANTA) 200-200-20 MG/5ML suspension 30 mL (30 mLs Oral Given 01/05/22 1144)    And  lidocaine (XYLOCAINE) 2 % viscous mouth solution 15 mL (15 mLs Oral Given 01/05/22 1144)     IMPRESSION / MDM / ASSESSMENT AND PLAN / ED COURSE  I reviewed the triage vital signs and the nursing notes.  Pleasant 72 year old male being treated for CLL presents to the ED with right-sided chest pain and concerns for pneumonia, without evidence of such and ultimately suitable for outpatient management.  He looks clinically well to me.  No evidence of neurologic or vascular deficits.   Blood work is benign with normal metabolic panel, 2 negative troponins and a negative D-dimer.  CBC with mild leukocytosis, which is nonspecific, particularly in the setting of his CLL and chemotherapy.  2 view CXR and CT chest without contrast without evidence of pneumonia or PTX or increasing size to his pericardial effusion.  I considered medical observation admission for this patient, but ultimately shared decision making with plan to pursue outpatient management with close PCP follow-up and return precautions for the ED, which I think is reasonable.  Clinical Course as of 01/05/22 1456  Thu Jan 05, 2022  1100 Discussed plan of care with patient and his wife.  We discussed less likely to be progressive PE considering his compliant with Eliquis, but we can do a CTA chest.  We discussed the concerns for pneumonia and noncontrasted CT would be sufficient for this.  Shared decision making it was plan to pursue a Noncon CT [DS]  5035 [id number].  Discussed CT results and pending second troponin [  DS]  1455 Reassessed.  We discussed reassuring repeat troponin and negative COVID/flu tests.  We again discussed the possible utility of a CTA chest, but he thinks it is very unlikely to be a PE, and I agree.  We discussed management at home and close return precautions for the ED. [DS]    Clinical Course User Index [DS] Vladimir Crofts, MD     FINAL CLINICAL IMPRESSION(S) / ED DIAGNOSES   Final diagnoses:  Other chest pain     Rx / DC Orders   ED Discharge Orders          Ordered    lidocaine (LIDODERM) 5 %  Every 12 hours        01/05/22 1455             Note:  This document was prepared using Dragon voice recognition software and may include unintentional dictation errors.   Vladimir Crofts, MD 01/05/22 [phone]

## 2022-01-05 NOTE — ED Notes (Signed)
Pt's wife taking him home.

## 2022-01-05 NOTE — ED Triage Notes (Signed)
Pt comes into the ED via POV c/o chest pain and tachycardia.  PT denies any SHOB.  Pt is currently a leukemia cancer patient. Pt states the chest pain is on the right and is non-radiating.  PT currently in NAD with even and unlabored respirations.  Pt denies any dizziness or nausea as well.

## 2022-01-16 ENCOUNTER — Other Ambulatory Visit: Payer: Self-pay

## 2022-01-16 ENCOUNTER — Ambulatory Visit (INDEPENDENT_AMBULATORY_CARE_PROVIDER_SITE_OTHER): Payer: Medicare PPO | Admitting: Physician Assistant

## 2022-01-16 ENCOUNTER — Inpatient Hospital Stay
Admission: EM | Admit: 2022-01-16 | Discharge: 2022-01-19 | DRG: 291 | Disposition: A | Discharge status: Home / Self Care | Payer: Medicare PPO | Attending: Family Medicine | Admitting: Family Medicine

## 2022-01-16 ENCOUNTER — Encounter: Payer: Self-pay | Admitting: Emergency Medicine

## 2022-01-16 ENCOUNTER — Emergency Department: Payer: Medicare PPO

## 2022-01-16 ENCOUNTER — Encounter: Payer: Self-pay | Admitting: Physician Assistant

## 2022-01-16 VITALS — BP 112/76 | HR 117 | Temp 95.8°F | Resp 16 | Ht 66.0 in | Wt 161.1 lb

## 2022-01-16 DIAGNOSIS — Z20822 Contact with and (suspected) exposure to covid-19: Secondary | ICD-10-CM | POA: Diagnosis present

## 2022-01-16 DIAGNOSIS — I4510 Unspecified right bundle-branch block: Secondary | ICD-10-CM | POA: Diagnosis present

## 2022-01-16 DIAGNOSIS — Z8679 Personal history of other diseases of the circulatory system: Secondary | ICD-10-CM

## 2022-01-16 DIAGNOSIS — Z79899 Other long term (current) drug therapy: Secondary | ICD-10-CM | POA: Diagnosis not present

## 2022-01-16 DIAGNOSIS — B009 Herpesviral infection, unspecified: Secondary | ICD-10-CM | POA: Diagnosis present

## 2022-01-16 DIAGNOSIS — I2510 Atherosclerotic heart disease of native coronary artery without angina pectoris: Secondary | ICD-10-CM | POA: Diagnosis present

## 2022-01-16 DIAGNOSIS — Z7901 Long term (current) use of anticoagulants: Secondary | ICD-10-CM | POA: Diagnosis not present

## 2022-01-16 DIAGNOSIS — J90 Pleural effusion, not elsewhere classified: Secondary | ICD-10-CM | POA: Diagnosis not present

## 2022-01-16 DIAGNOSIS — R0602 Shortness of breath: Secondary | ICD-10-CM

## 2022-01-16 DIAGNOSIS — E441 Mild protein-calorie malnutrition: Secondary | ICD-10-CM | POA: Diagnosis present

## 2022-01-16 DIAGNOSIS — R0600 Dyspnea, unspecified: Secondary | ICD-10-CM | POA: Diagnosis not present

## 2022-01-16 DIAGNOSIS — Z91018 Allergy to other foods: Secondary | ICD-10-CM | POA: Diagnosis not present

## 2022-01-16 DIAGNOSIS — Z720 Tobacco use: Secondary | ICD-10-CM | POA: Diagnosis not present

## 2022-01-16 DIAGNOSIS — J9811 Atelectasis: Secondary | ICD-10-CM | POA: Diagnosis present

## 2022-01-16 DIAGNOSIS — Z8249 Family history of ischemic heart disease and other diseases of the circulatory system: Secondary | ICD-10-CM

## 2022-01-16 DIAGNOSIS — C9110 Chronic lymphocytic leukemia of B-cell type not having achieved remission: Secondary | ICD-10-CM | POA: Diagnosis not present

## 2022-01-16 DIAGNOSIS — I77810 Thoracic aortic ectasia: Secondary | ICD-10-CM | POA: Diagnosis not present

## 2022-01-16 DIAGNOSIS — I5031 Acute diastolic (congestive) heart failure: Principal | ICD-10-CM | POA: Diagnosis present

## 2022-01-16 DIAGNOSIS — Z86711 Personal history of pulmonary embolism: Secondary | ICD-10-CM

## 2022-01-16 DIAGNOSIS — R509 Fever, unspecified: Secondary | ICD-10-CM | POA: Diagnosis not present

## 2022-01-16 DIAGNOSIS — I3139 Other pericardial effusion (noninflammatory): Secondary | ICD-10-CM | POA: Diagnosis present

## 2022-01-16 DIAGNOSIS — R0682 Tachypnea, not elsewhere classified: Secondary | ICD-10-CM | POA: Diagnosis not present

## 2022-01-16 DIAGNOSIS — E785 Hyperlipidemia, unspecified: Secondary | ICD-10-CM | POA: Diagnosis present

## 2022-01-16 DIAGNOSIS — D75839 Thrombocytosis, unspecified: Secondary | ICD-10-CM | POA: Diagnosis present

## 2022-01-16 DIAGNOSIS — Z6825 Body mass index (BMI) 25.0-25.9, adult: Secondary | ICD-10-CM | POA: Diagnosis not present

## 2022-01-16 DIAGNOSIS — I2699 Other pulmonary embolism without acute cor pulmonale: Secondary | ICD-10-CM | POA: Diagnosis present

## 2022-01-16 DIAGNOSIS — J439 Emphysema, unspecified: Secondary | ICD-10-CM | POA: Diagnosis not present

## 2022-01-16 DIAGNOSIS — R000 Tachycardia, unspecified: Secondary | ICD-10-CM | POA: Diagnosis not present

## 2022-01-16 DIAGNOSIS — F17210 Nicotine dependence, cigarettes, uncomplicated: Secondary | ICD-10-CM | POA: Diagnosis present

## 2022-01-16 DIAGNOSIS — I700 Atherosclerosis of aorta: Secondary | ICD-10-CM | POA: Diagnosis present

## 2022-01-16 DIAGNOSIS — J9601 Acute respiratory failure with hypoxia: Secondary | ICD-10-CM | POA: Diagnosis not present

## 2022-01-16 DIAGNOSIS — E873 Alkalosis: Secondary | ICD-10-CM | POA: Diagnosis present

## 2022-01-16 DIAGNOSIS — I509 Heart failure, unspecified: Secondary | ICD-10-CM | POA: Diagnosis not present

## 2022-01-16 HISTORY — DX: Other pulmonary embolism without acute cor pulmonale: I26.99

## 2022-01-16 LAB — CBC
HCT: 46.5 % (ref 39.0–52.0)
Hemoglobin: 15.3 g/dL (ref 13.0–17.0)
MCH: 29.4 pg (ref 26.0–34.0)
MCHC: 32.9 g/dL (ref 30.0–36.0)
MCV: 89.4 fL (ref 80.0–100.0)
Platelets: 641 10*3/uL — ABNORMAL HIGH (ref 150–400)
RBC: 5.2 MIL/uL (ref 4.22–5.81)
RDW: 13.9 % (ref 11.5–15.5)
WBC: 12.7 10*3/uL — ABNORMAL HIGH (ref 4.0–10.5)
nRBC: 0 % (ref 0.0–0.2)

## 2022-01-16 LAB — BLOOD GAS, VENOUS
Acid-Base Excess: 0.3 mmol/L (ref 0.0–2.0)
Bicarbonate: 23.5 mmol/L (ref 20.0–28.0)
O2 Saturation: 93.5 %
Patient temperature: 37
pCO2, Ven: 33 mmHg — ABNORMAL LOW (ref 44–60)
pH, Ven: 7.46 — ABNORMAL HIGH (ref 7.25–7.43)
pO2, Ven: 63 mmHg — ABNORMAL HIGH (ref 32–45)

## 2022-01-16 LAB — CBC WITH DIFFERENTIAL/PLATELET
Abs Immature Granulocytes: 0.19 10*3/uL — ABNORMAL HIGH (ref 0.00–0.07)
Basophils Absolute: 0.1 10*3/uL (ref 0.0–0.1)
Basophils Relative: 0 %
Eosinophils Absolute: 0 10*3/uL (ref 0.0–0.5)
Eosinophils Relative: 0 %
HCT: 41.6 % (ref 39.0–52.0)
Hemoglobin: 13.8 g/dL (ref 13.0–17.0)
Immature Granulocytes: 2 %
Lymphocytes Relative: 12 %
Lymphs Abs: 1.5 10*3/uL (ref 0.7–4.0)
MCH: 29.2 pg (ref 26.0–34.0)
MCHC: 33.2 g/dL (ref 30.0–36.0)
MCV: 87.9 fL (ref 80.0–100.0)
Monocytes Absolute: 1.2 10*3/uL — ABNORMAL HIGH (ref 0.1–1.0)
Monocytes Relative: 10 %
Neutro Abs: 9.6 10*3/uL — ABNORMAL HIGH (ref 1.7–7.7)
Neutrophils Relative %: 76 %
Platelets: 582 10*3/uL — ABNORMAL HIGH (ref 150–400)
RBC: 4.73 MIL/uL (ref 4.22–5.81)
RDW: 13.8 % (ref 11.5–15.5)
WBC: 12.4 10*3/uL — ABNORMAL HIGH (ref 4.0–10.5)
nRBC: 0 % (ref 0.0–0.2)

## 2022-01-16 LAB — BASIC METABOLIC PANEL
Anion gap: 11 (ref 5–15)
BUN: 10 mg/dL (ref 8–23)
CO2: 25 mmol/L (ref 22–32)
Calcium: 9.3 mg/dL (ref 8.9–10.3)
Chloride: 98 mmol/L (ref 98–111)
Creatinine, Ser: 0.85 mg/dL (ref 0.61–1.24)
GFR, Estimated: >60 mL/min (ref >60)
Glucose, Bld: 124 mg/dL — ABNORMAL HIGH (ref 70–99)
Potassium: 3.9 mmol/L (ref 3.5–5.1)
Sodium: 134 mmol/L — ABNORMAL LOW (ref 135–145)

## 2022-01-16 LAB — LACTIC ACID, PLASMA: Lactic Acid, Venous: 0.9 mmol/L (ref 0.5–1.9)

## 2022-01-16 LAB — RESP PANEL BY RT-PCR (FLU A&B, COVID) ARPGX2
Influenza A by PCR: NEGATIVE
Influenza B by PCR: NEGATIVE
SARS Coronavirus 2 by RT PCR: NEGATIVE

## 2022-01-16 LAB — PROTIME-INR
INR: 1.4 — ABNORMAL HIGH (ref 0.8–1.2)
Prothrombin Time: 17.1 seconds — ABNORMAL HIGH (ref 11.4–15.2)

## 2022-01-16 LAB — BLOOD GAS, ARTERIAL
Acid-base deficit: 0.9 mmol/L (ref 0.0–2.0)
Bicarbonate: 21.8 mmol/L (ref 20.0–28.0)
O2 Content: 28 L/min
O2 Saturation: 96.2 %
Patient temperature: 37
pCO2 arterial: 30 mmHg — ABNORMAL LOW (ref 32–48)
pH, Arterial: 7.47 — ABNORMAL HIGH (ref 7.35–7.45)
pO2, Arterial: 138 mmHg — ABNORMAL HIGH (ref 83–108)

## 2022-01-16 LAB — HEPATIC FUNCTION PANEL
ALT: 14 U/L (ref 0–44)
AST: 17 U/L (ref 15–41)
Albumin: 3 g/dL — ABNORMAL LOW (ref 3.5–5.0)
Alkaline Phosphatase: 67 U/L (ref 38–126)
Bilirubin, Direct: 0.2 mg/dL (ref 0.0–0.2)
Indirect Bilirubin: 0.6 mg/dL (ref 0.3–0.9)
Total Bilirubin: 0.8 mg/dL (ref 0.3–1.2)
Total Protein: 6.7 g/dL (ref 6.5–8.1)

## 2022-01-16 LAB — PROCALCITONIN: Procalcitonin: <0.1 ng/mL

## 2022-01-16 LAB — TROPONIN I (HIGH SENSITIVITY)
Troponin I (High Sensitivity): 7 ng/L (ref <18)
Troponin I (High Sensitivity): 6 ng/L (ref <18)

## 2022-01-16 LAB — BRAIN NATRIURETIC PEPTIDE: B Natriuretic Peptide: 70.8 pg/mL (ref 0.0–100.0)

## 2022-01-16 MED ORDER — IOHEXOL 350 MG/ML SOLN
75.0000 mL | Freq: Once | INTRAVENOUS | Status: AC | PRN
  Administered 2022-01-16: 75 mL via INTRAVENOUS

## 2022-01-16 MED ORDER — ACETAMINOPHEN 325 MG PO TABS: 650.0000 mg | ORAL_TABLET | Freq: Four times a day (QID) | ORAL | Status: DC | PRN

## 2022-01-16 MED ORDER — ONDANSETRON HCL 4 MG/2ML IJ SOLN: 4.0000 mg | Freq: Four times a day (QID) | INTRAMUSCULAR | Status: DC | PRN

## 2022-01-16 MED ORDER — ONDANSETRON HCL 4 MG PO TABS: 4.0000 mg | ORAL_TABLET | Freq: Four times a day (QID) | ORAL | Status: DC | PRN

## 2022-01-16 MED ORDER — APIXABAN 5 MG PO TABS
5.0000 mg | ORAL_TABLET | Freq: Two times a day (BID) | ORAL | Status: DC
  Administered 2022-01-16 – 2022-01-19 (×6): 5 mg via ORAL
  Filled 2022-01-16 (×6): qty 1

## 2022-01-16 MED ORDER — LIDOCAINE 5 % EX PTCH
1.0000 | MEDICATED_PATCH | Freq: Two times a day (BID) | CUTANEOUS | Status: DC
Start: 2022-01-16 — End: 2022-01-16

## 2022-01-16 MED ORDER — FUROSEMIDE 10 MG/ML IJ SOLN
40.0000 mg | Freq: Every day | INTRAMUSCULAR | Status: DC
  Administered 2022-01-17: 40 mg via INTRAVENOUS
  Filled 2022-01-16: qty 4

## 2022-01-16 MED ORDER — OXYCODONE HCL 5 MG PO TABS: 5.0000 mg | ORAL_TABLET | Freq: Four times a day (QID) | ORAL | Status: DC | PRN

## 2022-01-16 MED ORDER — COLCHICINE 0.6 MG PO TABS: 0.6000 mg | ORAL_TABLET | Freq: Two times a day (BID) | ORAL | Status: DC

## 2022-01-16 MED ORDER — PANTOPRAZOLE SODIUM 40 MG PO TBEC: 40.0000 mg | DELAYED_RELEASE_TABLET | Freq: Every day | ORAL | Status: DC

## 2022-01-16 MED ORDER — MONTELUKAST SODIUM 10 MG PO TABS: 10.0000 mg | ORAL_TABLET | Freq: Every day | ORAL | Status: DC

## 2022-01-16 MED ORDER — VENETOCLAX 100 MG PO TABS: 400.0000 mg | ORAL_TABLET | Freq: Every day | ORAL | Status: DC

## 2022-01-16 MED ORDER — FUROSEMIDE 10 MG/ML IJ SOLN
20.0000 mg | Freq: Once | INTRAMUSCULAR | Status: AC
  Administered 2022-01-16: 20 mg via INTRAVENOUS
  Filled 2022-01-16: qty 4

## 2022-01-16 NOTE — Progress Notes (Signed)
Acute Office Visit  Subjective:    Patient ID: Andres Mullins, male    DOB: 07/27/1951, 71 y.o.   MRN: [id number]  Today's Provider: Talitha Givens, MHS, PA-C Introduced myself to the patient as a PA-C and provided education on APPs in clinical practice.    Chief Complaint  Patient presents with   Hospitalization Follow-up    Denies chest pain has improved but has felt more weak, noticed night sweats, chills and even diarrhea.    HPI Patient is in today for follow up for ED  States symptoms started about 2 weeks ago with back pain, chest wall pain, breathlessness.  Reports SOB has increased since previous ED visit on 2/9 with mildly productive coughing. States he has called the cancer center for advice in regards to symptoms and was told he may have a. fib Breathlessness does not seem to be improved with any changes in positions  States he has been feeling "like I've been hit by a train" since leaving ED States he is feeling weak with nausea, anorexia, and diarrhea States they have had multiple negative COVID tests at home Unsure if these symptoms are from chemo, eliquis  States he is having prolonged SOB and gasping even during the night while asleep per wife  Denies concerning odor in stool for melena or c. Dif  States he can't seem to stay well hydrated - notes reduced skin and vascular turgor States yesterday he has sensation of sliding while walking - like he was slipping in water.  Denies recent travel.    Past Medical History:  Diagnosis Date   Atherosclerosis of coronary artery 09/28/2016   Chest CT Nov 2017   Atrial tachycardia (HCC)    CLL (chronic lymphocytic leukemia) (HCC)    Elevated C-reactive protein (CRP)    Elevated PSA    Fractured sternum 1999   Herpes    History of rib fracture 1999   multiple   Hyperlipidemia    Psoriasis    Rheumatoid factor positive 02/2014   Rosacea    Thoracic aortic atherosclerosis ([place]) 09/28/2016   Chest CT Nov 2017    Tobacco abuse    Vitamin D deficiency disease     Past Surgical History:  Procedure Laterality Date   APPENDECTOMY  1974   PERICARDIOCENTESIS N/A 10/03/2021   Procedure: PERICARDIOCENTESIS;  Surgeon: Jettie Booze, MD;  Location: [place] [facility];  Service: Cardiovascular;  Laterality: N/A;   PORTA CATH INSERTION N/A 06/06/2021   Procedure: PORTA CATH INSERTION;  Surgeon: Algernon Huxley, MD;  Location: [place] [facility];  Service: Cardiovascular;  Laterality: N/A;   SHOULDER ARTHROSCOPY  10/23/2016   Procedure: ARTHROSCOPY SHOULDER WITH DISTAL CLAVICLE EXCISION, PARTIAL ACROMIONECTOMY, AND DEBRIDEMENT;  Surgeon: Earnestine Leys, MD;  Location: [facility];  Service: Orthopedics;;   TIBIA FRACTURE SURGERY Left 2004   titanium rod, ORIF    Family History  Problem Relation Age of Onset   Heart disease Father    Osteoarthritis Father    Alzheimer's disease Father    Cancer Neg Hx    Diabetes Neg Hx    Hypertension Neg Hx    Stroke Neg Hx    COPD Neg Hx     Social History   Socioeconomic History   Marital status: Married    Spouse name: colleen   Number of children: Not on file   Years of education: 12   Highest education level: Bachelor's degree (e.g., BA, AB, BS)  Occupational History  Occupation: retired  Tobacco Use   Smoking status: Every Day    Packs/day: 0.50    Years: 45.00    Pack years: 22.50    Types: Cigarettes   Smokeless tobacco: Never   Tobacco comments:    11/6- currently smoking 5 cigarettes a day   Vaping Use   Vaping Use: Never used  Substance and Sexual Activity   Alcohol use: Yes    Alcohol/week: 3.0 standard drinks    Types: 3 Glasses of wine per week    Comment: a couple glasses of wine a week   Drug use: No   Sexual activity: Yes  Other Topics Concern   Not on file  Social History Narrative   Consumes ~4 cups of coffee/day   Social Determinants of Health   Financial Resource Strain: Not on file  Food Insecurity: Not on file   Transportation Needs: Not on file  Physical Activity: Not on file  Stress: Not on file  Social Connections: Not on file  Intimate Partner Violence: Not on file    Outpatient Medications Prior to Visit  Medication Sig Dispense Refill   acetaminophen (TYLENOL) 325 MG tablet Take by mouth every 6 (six) hours as needed.     acyclovir (ZOVIRAX) 400 MG tablet Take 1 tablet (400 mg total) by mouth 2 (two) times daily. 180 tablet 1   apixaban (ELIQUIS) 5 MG TABS tablet Take 1 tablet (5 mg total) by mouth 2 (two) times daily. 60 tablet 5   colchicine 0.6 MG tablet Take 1 tablet (0.6 mg total) by mouth 2 (two) times daily. 60 tablet 5   diphenhydrAMINE (BENADRYL) 25 MG tablet Take 25 mg by mouth every 6 (six) hours as needed.     hydrocortisone valerate cream (WESTCORT) 0.2 % Apply 1 application topically 2 (two) times daily. 45 g 0   lidocaine (LIDODERM) 5 % Place 1 patch onto the skin every 12 (twelve) hours. Remove & Discard patch within 12 hours or as directed by MD 10 patch 0   lidocaine-prilocaine (EMLA) cream Apply 30 -45 mins prior to port access. 30 g 0   montelukast (SINGULAIR) 10 MG tablet Take 1 tablet (10 mg total) by mouth at bedtime. Start 2 days prior to infusion. Do not take on the day of infusion. 60 tablet 0   oxyCODONE (OXY IR/ROXICODONE) 5 MG immediate release tablet Take 1 tablet (5 mg total) by mouth every 6 (six) hours as needed for moderate pain or severe pain. 20 tablet 0   pantoprazole (PROTONIX) 40 MG tablet Take 1 tablet (40 mg total) by mouth daily. 90 tablet 3   venetoclax (VENCLEXTA) 100 MG tablet Take 4 tablets (400 mg total) by mouth daily. Take 200 mg once a day x 1 week; and then 454m once a day. Tablets should be swallowed whole with a meal and a full glass of water. 120 tablet 1   No facility-administered medications prior to visit.    Allergies  Allergen Reactions   Gluten Meal     Gluten intolerance     Review of Systems  Constitutional:  Positive for  appetite change, diaphoresis (night sweats x2 nights necessitating sheet change) and fatigue.  Respiratory:  Positive for cough and shortness of breath.   Gastrointestinal:  Positive for diarrhea and nausea. Negative for blood in stool.  Endocrine: Negative for polydipsia and polyuria.  Genitourinary:  Negative for decreased urine volume, difficulty urinating, dysuria, frequency and urgency.  Neurological:  Negative for dizziness, speech difficulty,  weakness, light-headedness and headaches.  Psychiatric/Behavioral:  Positive for decreased concentration.       Objective:    Physical Exam Constitutional:      General: He is awake.     Appearance: Normal appearance. He is well-developed, well-groomed and normal weight.  HENT:     Head: Normocephalic and atraumatic.  Cardiovascular:     Rate and Rhythm: Regular rhythm. Tachycardia present.     Pulses: Normal pulses.     Heart sounds: Normal heart sounds.  Pulmonary:     Effort: Tachypnea and respiratory distress present.     Breath sounds: Normal breath sounds. No decreased air movement. No decreased breath sounds, wheezing, rhonchi or rales.  Musculoskeletal:     Right lower leg: No edema.     Left lower leg: No edema.  Neurological:     Mental Status: He is alert.  Psychiatric:        Attention and Perception: Attention and perception normal.        Mood and Affect: Mood and affect normal.        Speech: Speech normal.        Behavior: Behavior normal. Behavior is cooperative.        Thought Content: Thought content normal.        Cognition and Memory: Cognition and memory normal.        Judgment: Judgment normal.    BP 112/76    Pulse (!) 117    Temp (!) 95.8 F (35.4 C) (Oral)    Resp 16    Ht _0  (1.676 m)    Wt 161 lb 1.6 oz (73.1 kg)    SpO2 99%    BMI 26.00 kg/m  Wt Readings from Last 3 Encounters:  01/16/22 161 lb 1.6 oz (73.1 kg)  01/05/22 168 lb 12.8 oz (76.6 kg)  12/27/21 168 lb 12.8 oz (76.6 kg)    There are  no preventive care reminders to display for this patient.  There are no preventive care reminders to display for this patient.   Lab Results  Component Value Date   TSH 1.237 10/04/2021   Lab Results  Component Value Date   WBC 13.4 (H) 01/05/2022   HGB 15.9 01/05/2022   HCT 46.0 01/05/2022   MCV 87.3 01/05/2022   PLT 276 01/05/2022   Lab Results  Component Value Date   NA 137 01/05/2022   K 3.8 01/05/2022   CO2 25 01/05/2022   GLUCOSE 122 (H) 01/05/2022   BUN 13 01/05/2022   CREATININE 0.79 01/05/2022   BILITOT 0.4 12/27/2021   ALKPHOS 65 12/27/2021   AST 25 12/27/2021   ALT 37 12/27/2021   PROT 6.4 (L) 12/27/2021   ALBUMIN 3.9 12/27/2021   CALCIUM 9.4 01/05/2022   ANIONGAP 10 01/05/2022   EGFR 95 10/27/2021   Lab Results  Component Value Date   CHOL 194 10/06/2019   Lab Results  Component Value Date   HDL 45 10/06/2019   Lab Results  Component Value Date   LDLCALC 124 (H) 10/06/2019   Lab Results  Component Value Date   TRIG 136 10/06/2019   Lab Results  Component Value Date   CHOLHDL 4.3 10/06/2019   Lab Results  Component Value Date   HGBA1C 5.5 09/07/2021       Assessment & Plan:    Problem List Items Addressed This Visit   None Visit Diagnoses     Shortness of breath    -  Primary Acute,  ongoing for 2 weeks with progression Was previously seen in ED on 2/9 and PE, STEMI, COVID, pneumonia ruled out at that time Patient has history of provoked PE in 09/2021 which is currently managed with Eliquis  Patient is on active treatment plan for CLL with Venetoclax which could explain diarrhea, SOB and weakness Recommend discussion with Oncology team regarding SE and dosing if other causes are ruled out Discussed running CBC, CMP, BNP and performing EKG in office but I recommend returning to ED to rule out PE, anemia, STEMI, pericardial effusion  If emergent etiologies are ruled out with ED visit, will recommend Cardiology follow up for potential  echo and diagnostic cath procedure as this could be related to ischemic heart disease or new onset reduced cardiac output.  Further coordination with Oncology and hematology preferred as well to manage response to current treatment therapy. Will forward ED and visit today to team for further collaboration.      Tachycardia     Acute, new concern, with associated SOB and fatigue Regular rhythm noted today in office without noted Murmurs, rubs or gallops over aortic, pulmonary, tricuspid, and apex auscultation sites.  With increased SOB recommend return to ED to rule out PE, anemia, STEMI, pericardial effusion  Recommend follow up with cardiology if/after emergent etiologies ruled out for potential heart monitor, echo and diagnostic cath as deemed necessary by cardiology.  Will reach out to other specialty care teams to coordinate care following ED visit findings to assist with tachycardia, fatigue, and SOB.         No follow-ups on file.   I, Dhilan Brauer E Julieta Rogalski, PA-C, have reviewed all documentation for this visit. The documentation on 01/16/22 for the exam, diagnosis, procedures, and orders are all accurate and complete.   Jonmichael Beadnell, Glennie Isle MPH [facility]   No orders of the defined types were placed in this encounter.    Nashid Pellum E Finley Dinkel, PA-C

## 2022-01-16 NOTE — Progress Notes (Signed)
1700 patient came up with wife from ER alert x4 on 2L  able to make all needs known uses urinal and able to walk to bed. CHG completed. Patient has a right upper mediport not accessed and a PIV in  in right AC 20G.

## 2022-01-16 NOTE — Assessment & Plan Note (Signed)
Smoking cessation was discussed with patient in detail. Patient states that he has cut back on the number of cigarettes he smokes He declines a nicotine transdermal patch at this time

## 2022-01-16 NOTE — H&P (Signed)
History and Physical    Patient: Andres Mullins XKG:[id number] DOB: 09-27-1951 DOA: 01/16/2022 DOS: the patient was seen and examined on 01/16/2022 PCP: [name], PA-C  Patient coming from: Home  Chief Complaint:  Chief Complaint  Patient presents with   Tachycardia    short   Shortness of Breath    HPI: Andres Mullins is a 71 y.o. 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 Patient was seen in the ER on 01/05/22 for evaluation of chest pain that was mostly right-sided and he was concerned that he may have had a pneumonia.  He had normal labs, CT chest that was negative for pneumonia and negative troponin and was discharged home. He presents to the ER today from his primary care provider's office for evaluation of a 10-day history of shortness of breath according to the patient. Patient states that he has had shortness of breath both with rest and exertion associated with palpitations.  He has had chills but denies having any fever.  He also has an occasional nonproductive cough.  He denies having any chest pain. He states that his oral intake has been poor but denies having any abdominal pain.  He has diarrhea which is thought to be due to his oral chemotherapy.  He denies having any urinary frequency, no nocturia, no dysuria, he has no leg swelling, no dizziness, no lightheadedness, no blurred vision no focal deficits.  Review of Systems: As mentioned in the history of present illness. All other systems reviewed and are negative. Past Medical History:  Diagnosis Date   Atherosclerosis of coronary artery 09/28/2016   Chest CT Nov 2017   Atrial tachycardia (HCC)    CLL (chronic lymphocytic leukemia) (HCC)    Elevated C-reactive  protein (CRP)    Elevated PSA    Fractured sternum 1999   Herpes    History of rib fracture 1999   multiple   Hyperlipidemia    Psoriasis    Pulmonary embolism ([place])    Rheumatoid factor positive 02/2014   Rosacea    Thoracic aortic atherosclerosis ([place]) 09/28/2016   Chest CT Nov 2017   Tobacco abuse    Vitamin D deficiency disease    Past Surgical History:  Procedure Laterality Date   APPENDECTOMY  1974   PERICARDIOCENTESIS N/A 10/03/2021   Procedure: PERICARDIOCENTESIS;  Surgeon: Jettie Booze, MD;  Location: [place] [facility];  Service: Cardiovascular;  Laterality: N/A;   PORTA CATH INSERTION N/A 06/06/2021   Procedure: PORTA CATH INSERTION;  Surgeon: Algernon Huxley, MD;  Location: [place] [facility];  Service: Cardiovascular;  Laterality: N/A;   SHOULDER ARTHROSCOPY  10/23/2016   Procedure: ARTHROSCOPY SHOULDER WITH DISTAL CLAVICLE EXCISION, PARTIAL ACROMIONECTOMY, AND DEBRIDEMENT;  Surgeon: Earnestine Leys, MD;  Location: [facility];  Service: Orthopedics;;   TIBIA FRACTURE SURGERY Left 2004   titanium rod, ORIF   Social History:  reports that he has been smoking cigarettes. He has a 22.50 pack-year smoking history. He has never used smokeless tobacco. He reports current alcohol use of about 3.0 standard drinks per week. He reports that he does not use drugs.  Allergies  Allergen Reactions   Gluten Meal     Gluten intolerance     Family History  Problem Relation Age of  Onset   Heart disease Father    Osteoarthritis Father    Alzheimer's disease Father    Cancer Neg Hx    Diabetes Neg Hx    Hypertension Neg Hx    Stroke Neg Hx    COPD Neg Hx     Prior to Admission medications   Medication Sig Start Date End Date Taking? Authorizing Provider  acetaminophen (TYLENOL) 325 MG tablet Take by mouth every 6 (six) hours as needed.    [provider]  acyclovir (ZOVIRAX) 400 MG tablet Take 1 tablet (400 mg total) by mouth 2 (two) times daily. 10/26/21    Cammie Sickle, MD  apixaban (ELIQUIS) 5 MG TABS tablet Take 1 tablet (5 mg total) by mouth 2 (two) times daily. 11/01/21   Cammie Sickle, MD  colchicine 0.6 MG tablet Take 1 tablet (0.6 mg total) by mouth 2 (two) times daily. 10/27/21   Dunn, Areta Haber, PA-C  diphenhydrAMINE (BENADRYL) 25 MG tablet Take 25 mg by mouth every 6 (six) hours as needed.    [provider]  hydrocortisone valerate cream (WESTCORT) 0.2 % Apply 1 application topically 2 (two) times daily. 12/19/21   Mecum, Erin E, PA-C  lidocaine (LIDODERM) 5 % Place 1 patch onto the skin every 12 (twelve) hours. Remove & Discard patch within 12 hours or as directed by MD 01/05/22 01/05/23  Vladimir Crofts, MD  lidocaine-prilocaine (EMLA) cream Apply 30 -45 mins prior to port access. 05/22/21   Cammie Sickle, MD  montelukast (SINGULAIR) 10 MG tablet Take 1 tablet (10 mg total) by mouth at bedtime. Start 2 days prior to infusion. Do not take on the day of infusion. 05/22/21   Cammie Sickle, MD  oxyCODONE (OXY IR/ROXICODONE) 5 MG immediate release tablet Take 1 tablet (5 mg total) by mouth every 6 (six) hours as needed for moderate pain or severe pain. 10/18/21   Lavina Hamman, MD  pantoprazole (PROTONIX) 40 MG tablet Take 1 tablet (40 mg total) by mouth daily. 10/27/21   Dunn, Areta Haber, PA-C  venetoclax (VENCLEXTA) 100 MG tablet Take 4 tablets (400 mg total) by mouth daily. Take 200 mg once a day x 1 week; and then 429m once a day. Tablets should be swallowed whole with a meal and a full glass of water. 12/30/21   BCammie Sickle MD    Physical Exam: Vitals:   01/16/22 1223 01/16/22 1230 01/16/22 1300 01/16/22 1330  BP: 135/85  107/75 103/75  Pulse: (!) 116 (!) 111 (!) 108 (!) 107  Resp: 20 (!) 27 (!) 31 (!) 29  Temp:      TempSrc:      SpO2: 95% 97% 98% 98%  Weight:      Height:       Physical Exam Vitals and nursing note reviewed.  Constitutional:      Appearance: He is well-developed and normal  weight.     Comments: Appears tachypneic at rest  HENT:     Head: Normocephalic and atraumatic.     Mouth/Throat:     Mouth: Mucous membranes are moist.  Eyes:     Pupils: Pupils are equal, round, and reactive to light.  Cardiovascular:     Rate and Rhythm: Regular rhythm. Tachycardia present.  Pulmonary:     Effort: Pulmonary effort is normal.     Breath sounds: Examination of the right-lower field reveals decreased breath sounds and rales. Examination of the left-lower field reveals decreased breath sounds and  rales. Decreased breath sounds and rales present.  Abdominal:     General: Bowel sounds are normal.     Palpations: Abdomen is soft.  Musculoskeletal:        General: Normal range of motion.     Cervical back: Normal range of motion and neck supple.  Skin:    General: Skin is warm and dry.  Neurological:     General: No focal deficit present.     Mental Status: He is alert.  Psychiatric:        Mood and Affect: Mood normal.        Behavior: Behavior normal.     Data Reviewed: Data Reviewed: Relevant notes from primary care and specialist visits, past discharge summaries as available in EHR, including Care Everywhere. Prior diagnostic testing as pertinent to current admission diagnoses Updated medications and problem lists for reconciliation ED course, including vitals, labs, imaging, treatment and response to treatment Triage notes, nursing and pharmacy notes and ED provider's notes Notable results as noted in HPI Labs reviewed.  ABG 7.47/30/138/21.8/96.2.  On 2 L of oxygen White count 12.4, platelet count 582, BNP 70.8 Respiratory viral panel is negative Chest x-ray reviewed by me shows new small bilateral pleural effusions with mild left basilar opacities likely reflecting atelectasis although infection is not excluded. CT angiogram of the chest shows stable small pericardial effusion. New bilateral pleural effusions associated with atelectasis, LEFT greater  than RIGHT. Twelve-lead EKG reviewed by me shows sinus tachycardia, incomplete right bundle branch block, left axis deviation. There are no new results to review at this time.  Assessment and Plan: * Acute diastolic CHF (congestive heart failure) ([place])- (present on admission) Patient presents for evaluation of worsening shortness of breath and imaging shows bilateral pleural effusions. He does not have a known history of CHF and last 2D echocardiogram from 11/22 shows an LVEF of 60 to 65%. Patient is status post pericardiocentesis for cardiac tamponade in 11/22 A stat 2D echocardiogram has been ordered Gentle diuresis We will request cardiology consult  History of pericarditis Continue colchicine  Pulmonary embolism ([place])- (present on admission) Continue apixaban 5 mg p.o. twice daily Repeat CT angiogram is negative for acute pulmonary embolism  CLL (chronic lymphocytic leukemia) ([place])- (present on admission) Stable Continue Venetoclax Follow-up with oncology as an outpatient  Tobacco abuse- (present on admission) Smoking cessation was discussed with patient in detail. Patient states that he has cut back on the number of cigarettes he smokes He declines a nicotine transdermal patch at this time       Advance Care Planning:   Code Status: Full Code   Consults: Cardiology  Family Communication: Greater than 50% of time was spent discussing patient's condition and plan of care with him and his wife at the bedside.  All questions and concerns have been addressed.  They verbalized understanding and agree with the plan.  Severity of Illness: The appropriate patient status for this patient is INPATIENT. Inpatient status is judged to be reasonable and necessary in order to provide the required intensity of service to ensure the patient's safety. The patient's presenting symptoms, physical exam findings, and initial radiographic and laboratory data in the context of their chronic  comorbidities is felt to place them at high risk for further clinical deterioration. Furthermore, it is not anticipated that the patient will be medically stable for discharge from the hospital within 2 midnights of admission.   * I certify that at the point of admission it is my clinical  judgment that the patient will require inpatient hospital care spanning beyond 2 midnights from the point of admission due to high intensity of service, high risk for further deterioration and high frequency of surveillance required.*  Author: Collier Bullock, MD 01/16/2022 4:17 PM  For on call review [email or web address].

## 2022-01-16 NOTE — ED Provider Notes (Addendum)
[facility] Provider Note    Event Date/Time   First MD Initiated Contact with Patient 01/16/22 1205     (approximate)   History   Tachycardia (short) and Shortness of Breath   HPI  Andres Mullins is a 71 y.o. male with a history of chronic lymphocytic leukemia.  He has a recent history of pericardial effusion and pulmonary embolus and pneumonia.  With each of these he was tachypneic and tachycardic although he did have chest pain fever for some of them.  Patient is now tachypneic and tachycardic and short of breath again.  He is not having any chest pain or tightness.  He is not running a fever.  He is feeling short of breath however.  His heart rate is in the 1 teens and his respiratory rates 20-25.      Physical Exam   Triage Vital Signs: ED Triage Vitals  Enc Vitals Group     BP 01/16/22 1155 (!) 122/94     Pulse Rate 01/16/22 1155 (!) 114     Resp 01/16/22 1155 (!) 24     Temp 01/16/22 1155 97.7 F (36.5 C)     Temp Source 01/16/22 1155 Oral     SpO2 01/16/22 1155 97 %     Weight 01/16/22 1156 161 lb 1.6 oz (73.1 kg)     Height 01/16/22 1156 5' 6"  (1.676 m)     Head Circumference --      Peak Flow --      Pain Score 01/16/22 1156 0     Pain Loc --      Pain Edu? --      Excl. in [place]? --     Most recent vital signs: Vitals:   01/16/22 1300 01/16/22 1330  BP: 107/75 103/75  Pulse: (!) 108 (!) 107  Resp: (!) 31 (!) 29  Temp:    SpO2: 98% 98%     General: Awake, alert, looks uncomfortable CV:  Good peripheral perfusion.  Heart is regular rate and rhythm although fast no audible murmurs Resp:  Increased rate and increased effort but no retractions and lungs sound clear to me Abd:  No distention.  Soft and nontender Extremities: No edema   ED Results / Procedures / Treatments   Labs (all labs ordered are listed, but only abnormal results are displayed) Labs Reviewed  BASIC METABOLIC PANEL - Abnormal; Notable for the following  components:      Result Value   Sodium 134 (*)    Glucose, Bld 124 (*)    All other components within normal limits  CBC - Abnormal; Notable for the following components:   WBC 12.7 (*)    Platelets 641 (*)    All other components within normal limits  PROTIME-INR - Abnormal; Notable for the following components:   Prothrombin Time 17.1 (*)    INR 1.4 (*)    All other components within normal limits  HEPATIC FUNCTION PANEL - Abnormal; Notable for the following components:   Albumin 3.0 (*)    All other components within normal limits  BLOOD GAS, VENOUS - Abnormal; Notable for the following components:   pH, Ven 7.46 (*)    pCO2, Ven 33 (*)    pO2, Ven 63 (*)    All other components within normal limits  CBC WITH DIFFERENTIAL/PLATELET - Abnormal; Notable for the following components:   WBC 12.4 (*)    Platelets 582 (*)    Neutro Abs 9.6 (*)  Monocytes Absolute 1.2 (*)    Abs Immature Granulocytes 0.19 (*)    All other components within normal limits  RESP PANEL BY RT-PCR (FLU A&B, COVID) ARPGX2  BRAIN NATRIURETIC PEPTIDE  LACTIC ACID, PLASMA  PROCALCITONIN  DIFFERENTIAL  BLOOD GAS, ARTERIAL  TROPONIN I (HIGH SENSITIVITY)  TROPONIN I (HIGH SENSITIVITY)     EKG  EKG read interpreted by me shows sinus tachycardia rate of 113 left axis incomplete right bundle branch block.  T waves are flipped in V1 through V3.  Previous EKG showed flipped T's in V1 and V2.  The difference could be lead placement.   RADIOLOGY The chest x-ray films were read by radiology and reviewed by me.  I also reviewed prior chest x-rays.  Since I cannot review prior x-ray results it means I reviewed the x-rays and not the results.  It does show small bilateral pleural effusions.  These were present early in November last year but then appeared to improve.  There are some basilar opacities especially on the left which may be atelectasis or effusion or infiltrate from infection. CT read by radiology  reviewed by me shows no pulmonary embolus.  There are small pleural effusions left greater than right with some what radiology is reading is bibasilar atelectasis.  PROCEDURES:  Critical Care performed: Critical care time 20 minutes.  This includes speaking to the hospitalist and discussing the patient's care with him and his wife.  Additionally I reviewed all of his studies including his old x-rays and CT scans not the results but the actual pictures and a lot of his old notes from hospitalization.  I additionally I discussed him in detail with the hospitalist.  I also did the bedside ultrasound.  Procedures   MEDICATIONS ORDERED IN ED: Medications  iohexol (OMNIPAQUE) 350 MG/ML injection 75 mL (75 mLs Intravenous Contrast Given 01/16/22 1344)     IMPRESSION / MDM / ASSESSMENT AND PLAN / ED COURSE  I reviewed the triage vital signs and the nursing notes.  Bedside cardiac ultrasound does not show any right-sided collapse.  There is an effusion but it does not appear to be large.  I discussed the patient in detail with the hospitalist who will admit the patient we will try some Lasix and work-up the patient further.  I have ordered an echocardiogram to double check my findings.      FINAL CLINICAL IMPRESSION(S) / ED DIAGNOSES   Final diagnoses:  Dyspnea, unspecified type  Tachycardia  Tachypnea     Rx / DC Orders   ED Discharge Orders     None        Note:  This document was prepared using Dragon voice recognition software and may include unintentional dictation errors.   Nena Polio, MD 01/16/22 1549    Nena Polio, MD 01/16/22 [phone]

## 2022-01-16 NOTE — Assessment & Plan Note (Addendum)
Stable. Continue Venetoclax Discussed with Dr. Rogue Bussing, he will see him

## 2022-01-16 NOTE — Assessment & Plan Note (Addendum)
Continue apixaban 5 mg p.o. twice daily.  This was diagnosed on a CT in 09/2021  Repeat CT angiogram is negative for acute pulmonary embolism

## 2022-01-16 NOTE — Plan of Care (Signed)

## 2022-01-16 NOTE — Assessment & Plan Note (Signed)
Continue colchicine

## 2022-01-16 NOTE — Plan of Care (Signed)
°  Problem: Education: Goal: Ability to demonstrate management of disease process will improve Outcome: Progressing Goal: Ability to verbalize understanding of medication therapies will improve Outcome: Progressing Goal: Individualized Educational Video(s) Outcome: Progressing   Problem: Activity: Goal: Capacity to carry out activities will improve Outcome: Progressing  Patient alert x4 able to make needs known very SOB with talking HR in 120s

## 2022-01-16 NOTE — Assessment & Plan Note (Addendum)
Patient presents for evaluation of worsening shortness of breath and imaging shows bilateral pleural effusions. last 2D echocardiogram from 11/22 shows an LVEF of 60 to 65%.  Repeat echo shows EF of 55 to 60%.  Grade 2 diastolic dysfunction.  No cardiac tamponade Patient is status post pericardiocentesis for cardiac tamponade in 11/22 Continue Lasix 40 mg IV daily for now

## 2022-01-16 NOTE — Progress Notes (Signed)
°   01/16/22 2023  Assess: MEWS Score  Level of Consciousness Alert  Assess: MEWS Score  MEWS Temp 0  MEWS Systolic 1  MEWS Pulse 1  MEWS RR 0  MEWS LOC 0  MEWS Score 2  MEWS Score Color Yellow  Assess: if the MEWS score is Yellow or Red  Were vital signs taken at a resting state? Yes  Focused Assessment No change from prior assessment  Does the patient meet 2 or more of the SIRS criteria? No  MEWS guidelines implemented *See Row Information* No, previously yellow, continue vital signs every 4 hours  Notify: Charge Nurse/RN  Name of Charge Nurse/RN Notified Ria Comment, RN  Date Charge Nurse/RN Notified 01/16/22  Time Charge Nurse/RN Notified 2000  Document  Progress note created (see row info) Yes

## 2022-01-16 NOTE — ED Triage Notes (Signed)
Pt to ED via POV with c/o continuing to be tachycardic and SHOB. He was at his PMD for a follow up of being here for the same on 2/9. Pt is able to speak in a few word sentences and is breathing heavily in between. He is using his accessory muscles mildly to breath.

## 2022-01-17 ENCOUNTER — Inpatient Hospital Stay: Payer: Medicare PPO

## 2022-01-17 ENCOUNTER — Inpatient Hospital Stay (HOSPITAL_COMMUNITY)
Admit: 2022-01-17 | Discharge: 2022-01-17 | Disposition: A | Discharge status: Home / Self Care | Payer: Medicare PPO | Attending: Emergency Medicine | Admitting: Emergency Medicine

## 2022-01-17 DIAGNOSIS — R509 Fever, unspecified: Secondary | ICD-10-CM | POA: Diagnosis not present

## 2022-01-17 DIAGNOSIS — I5031 Acute diastolic (congestive) heart failure: Secondary | ICD-10-CM | POA: Diagnosis not present

## 2022-01-17 DIAGNOSIS — J90 Pleural effusion, not elsewhere classified: Secondary | ICD-10-CM

## 2022-01-17 DIAGNOSIS — C9110 Chronic lymphocytic leukemia of B-cell type not having achieved remission: Secondary | ICD-10-CM

## 2022-01-17 DIAGNOSIS — I3139 Other pericardial effusion (noninflammatory): Secondary | ICD-10-CM

## 2022-01-17 DIAGNOSIS — I509 Heart failure, unspecified: Secondary | ICD-10-CM

## 2022-01-17 DIAGNOSIS — Z86711 Personal history of pulmonary embolism: Secondary | ICD-10-CM | POA: Diagnosis not present

## 2022-01-17 DIAGNOSIS — J9601 Acute respiratory failure with hypoxia: Secondary | ICD-10-CM

## 2022-01-17 LAB — RESPIRATORY PANEL BY PCR

## 2022-01-17 LAB — BASIC METABOLIC PANEL
Anion gap: 11 (ref 5–15)
BUN: 11 mg/dL (ref 8–23)
CO2: 22 mmol/L (ref 22–32)
Calcium: 9 mg/dL (ref 8.9–10.3)
Chloride: 101 mmol/L (ref 98–111)
Creatinine, Ser: 0.74 mg/dL (ref 0.61–1.24)
GFR, Estimated: >60 mL/min (ref >60)
Glucose, Bld: 131 mg/dL — ABNORMAL HIGH (ref 70–99)
Potassium: 4 mmol/L (ref 3.5–5.1)
Sodium: 134 mmol/L — ABNORMAL LOW (ref 135–145)

## 2022-01-17 LAB — ECHOCARDIOGRAM COMPLETE
AR max vel: 3.32 cm2
AV Area VTI: 4.29 cm2
AV Area mean vel: 3.29 cm2
AV Mean grad: 2 mmHg
AV Peak grad: 4.3 mmHg
Ao pk vel: 1.04 m/s
Area-P 1/2: 5.79 cm2
Height: 66 in
MV VTI: 2.95 cm2
S' Lateral: 3.84 cm
Weight: 2546.75 oz

## 2022-01-17 LAB — SEDIMENTATION RATE: Sed Rate: 55 mm/hr — ABNORMAL HIGH (ref 0–20)

## 2022-01-17 LAB — HEPATIC FUNCTION PANEL
ALT: 15 U/L (ref 0–44)
AST: 14 U/L — ABNORMAL LOW (ref 15–41)
Albumin: 3.1 g/dL — ABNORMAL LOW (ref 3.5–5.0)
Alkaline Phosphatase: 71 U/L (ref 38–126)
Bilirubin, Direct: <0.1 mg/dL (ref 0.0–0.2)
Total Bilirubin: 0.3 mg/dL (ref 0.3–1.2)
Total Protein: 6.8 g/dL (ref 6.5–8.1)

## 2022-01-17 LAB — CBC
HCT: 39.7 % (ref 39.0–52.0)
Hemoglobin: 13.7 g/dL (ref 13.0–17.0)
MCH: 29.7 pg (ref 26.0–34.0)
MCHC: 34.5 g/dL (ref 30.0–36.0)
MCV: 86.1 fL (ref 80.0–100.0)
Platelets: 557 10*3/uL — ABNORMAL HIGH (ref 150–400)
RBC: 4.61 MIL/uL (ref 4.22–5.81)
RDW: 14 % (ref 11.5–15.5)
WBC: 12.9 10*3/uL — ABNORMAL HIGH (ref 4.0–10.5)
nRBC: 0 % (ref 0.0–0.2)

## 2022-01-17 LAB — LACTIC ACID, PLASMA
Lactic Acid, Venous: 1.3 mmol/L (ref 0.5–1.9)
Lactic Acid, Venous: 1.5 mmol/L (ref 0.5–1.9)

## 2022-01-17 LAB — C-REACTIVE PROTEIN: CRP: 19.7 mg/dL — ABNORMAL HIGH (ref <1.0)

## 2022-01-17 MED ORDER — VENETOCLAX 100 MG PO TABS: 400.0000 mg | ORAL_TABLET | Freq: Every day | ORAL | Status: DC

## 2022-01-17 MED ORDER — ACYCLOVIR 200 MG PO CAPS
400.0000 mg | ORAL_CAPSULE | Freq: Two times a day (BID) | ORAL | Status: DC
Start: 2022-01-17 — End: 2022-01-19
  Administered 2022-01-17 – 2022-01-19 (×5): 400 mg via ORAL
  Filled 2022-01-17 (×5): qty 2

## 2022-01-17 MED ORDER — VENETOCLAX 100 MG PO TABS
400.0000 mg | ORAL_TABLET | Freq: Every day | ORAL | Status: DC
  Filled 2022-01-17: qty 4

## 2022-01-17 MED ORDER — NICOTINE 14 MG/24HR TD PT24
14.0000 mg | MEDICATED_PATCH | Freq: Every day | TRANSDERMAL | Status: DC
  Administered 2022-01-17 – 2022-01-18 (×2): 14 mg via TRANSDERMAL
  Filled 2022-01-17 (×4): qty 1

## 2022-01-17 MED ORDER — ACYCLOVIR 400 MG PO TABS
400.0000 mg | ORAL_TABLET | Freq: Two times a day (BID) | ORAL | Status: DC
  Filled 2022-01-17 (×2): qty 1

## 2022-01-17 NOTE — Progress Notes (Signed)
*  PRELIMINARY RESULTS* Echocardiogram 2D Echocardiogram has been performed.  Andres Mullins 01/17/2022, 8:51 AM

## 2022-01-17 NOTE — Assessment & Plan Note (Signed)
Seen on CT chest.  Left greater than right.  We will consult pulmonary.  Ultrasound-guided thoracentesis ordered

## 2022-01-17 NOTE — Assessment & Plan Note (Signed)
On acyclovir

## 2022-01-17 NOTE — Progress Notes (Addendum)
Progress Note   Patient: Andres Mullins NTI:[id number] DOB: 1951-09-01 DOA: 01/16/2022     1 DOS: the patient was seen and examined on 01/17/2022   Brief hospital course: 71 y.o. 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  Left sided thoracentesis pending cardio, pulmonary, ID, oncology consult   Assessment and Plan: * Acute hypoxemic respiratory failure ([place])- (present on admission) Multifactorial.  Bilateral pleural effusion, pericardial effusion, CHF, CLL and PE.  He is on 2 L oxygen via nasal cannula.  Wean as able to room air  Fever with chills- (present on admission) Patient reports low-grade fever with chills.  He reports to episodes where he had drenching night sweats since February 11.  We will consult ID.  Hold off any antibiotics for now  Pleural effusion due to CHF (congestive heart failure) ([place]) Seen on CT chest.  Left greater than right.  We will consult pulmonary.  Ultrasound-guided thoracentesis ordered  History of pericarditis Continue colchicine  Acute diastolic CHF (congestive heart failure) ([place])- (present on admission) Patient presents for evaluation of worsening shortness of breath and imaging shows bilateral pleural effusions. last 2D echocardiogram from 11/22 shows an LVEF of 60 to 65%.  Repeat echo shows EF of 55 to 60%.  Grade 2 diastolic dysfunction.  No cardiac tamponade Patient is status post pericardiocentesis for cardiac tamponade in 11/22 Continue Lasix 40 mg IV daily for now  Pulmonary embolism ([place])- (present on admission) Continue apixaban 5 mg p.o. twice daily.  This was diagnosed on a CT in 09/2021  Repeat CT angiogram is negative for acute pulmonary embolism  CLL (chronic lymphocytic leukemia) ([place])- (present on admission) Stable.  Continue Venetoclax Discussed with Dr. Rogue Bussing, he will see him  Tobacco abuse- (present on admission) Smoking cessation was discussed with patient in detail. Patient states that he has cut back on the number of cigarettes he smokes He declines a nicotine transdermal patch at this time  Herpes- (present on admission) On acyclovir        Subjective: Very dyspneic even at rest.  Reports having drenching night sweats/2 episodes since 11 February.  Also reports having chills and some low-grade fever.  He does not feel comfortable at all  Physical Exam: Vitals:   01/16/22 2355 01/17/22 0316 01/17/22 0721 01/17/22 1108  BP: 99/72 98/74 107/76 104/70  Pulse: (!) 108 (!) 104 79 (!) 58  Resp: 16 20 15 16   Temp: 98.5 F (36.9 C) 99.2 F (37.3 C) 98.5 F (36.9 C) 97.7 F (36.5 C)  TempSrc:  Oral Oral   SpO2: 94% 95% 96% 99%  Weight: 72.2 kg     Height: 5' 6"  (1.92 m)      71 year old male sitting in the bed looking acutely ill Eyes pupil equal round reactive to light and accommodation Lungs decreased breath sounds at the bases, rales present, no rhonchi or wheezing Cardiovascular S1-S2 normal, no murmur rales or gallop Abdomen soft, benign Skin patches of rosacea Neuro alert and oriented, nonfocal  Data Reviewed:  There are no new results to review at this time.  Family Communication: Patient's wife is at bedside when I called back to update about ultrasound-guided thoracentesis outcome.  Patient said he will update his wife if she could hear my discussion with him  Disposition: Status is: Inpatient Remains inpatient appropriate because: Critically sick with high  risk for acute cardiorespiratory failure, multiorgan failure and worsening clinical condition     DVT prophylaxis-Eliquis      Planned Discharge Destination: Home     Time spent: 35 minutes  Author: Max Sane, MD 01/17/2022 1:32 PM  For on call review [email or web address].

## 2022-01-17 NOTE — Consult Note (Signed)
[facility] CONSULT NOTE  Patient Care Team: [name], PA-C as PCP - General (Family Medicine) End, Harrell Gave, MD as PCP - Cardiology (Cardiology) Lada, Satira Anis, MD as Attending Physician (Family Medicine) Brendolyn Patty, MD (Dermatology) Billey Co, MD as Consulting Physician (Urology) Cammie Sickle, MD as Consulting Physician (Hematology and Oncology)  CHIEF COMPLAINTS/PURPOSE OF CONSULTATION: CLL-on chemotherapy  HISTORY OF PRESENTING ILLNESS:  Andres Mullins 71 y.o.  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  Of note patient was recently evaluated in the emergency room-CT chest noncontrast negative for any pneumonia.  However patient continues to notice worsening shortness of breath for the last few days.  Patient was noted to have worsening cough nonproductive.  Also noted to have also noted to have chest discomfort.  Also noted to have palpitations.  This prompted reevaluation emergency room.  CTA chest-negative for any PE; however showed bilateral small pleural effusions/atelectasis.   Patient is tachypneic respiratory rate around 20-24; however patient's O2 saturations on 2 L-he is around 98%.  Patient has been evaluated by cardiology; pulmonary.  And is currently on diuresis.  Patient currently sitting in chair;  Short of breath with conversation.  Of note patient recently came off prednisone taper-given recent history of pericarditis.  Review of Systems  Constitutional:  Positive for chills, diaphoresis, fever and malaise/fatigue. Negative for weight loss.  HENT:  Negative for nosebleeds and sore throat.   Eyes:  Negative for double vision.  Respiratory:  Positive for cough and shortness of breath. Negative for hemoptysis, sputum production and wheezing.    Cardiovascular:  Negative for chest pain, palpitations, orthopnea and leg swelling.  Gastrointestinal:  Negative for abdominal pain, blood in stool, constipation, diarrhea, heartburn, melena, nausea and vomiting.  Genitourinary:  Negative for dysuria, frequency and urgency.  Musculoskeletal:  Negative for back pain and joint pain.  Skin: Negative.  Negative for itching and rash.  Neurological:  Negative for dizziness, tingling, focal weakness, weakness and headaches.  Endo/Heme/Allergies:  Does not bruise/bleed easily.  Psychiatric/Behavioral:  Negative for depression. The patient is not nervous/anxious and does not have insomnia.     MEDICAL HISTORY:  Past Medical History:  Diagnosis Date   Atherosclerosis of coronary artery 09/28/2016   Chest CT Nov 2017   Atrial tachycardia (HCC)    CLL (chronic lymphocytic leukemia) (HCC)    Elevated C-reactive protein (CRP)    Elevated PSA    Fractured sternum 1999   Herpes    History of rib fracture 1999   multiple   Hyperlipidemia    Psoriasis    Pulmonary embolism ([place])    Rheumatoid factor positive 02/2014   Rosacea    Thoracic aortic atherosclerosis ([place]) 09/28/2016   Chest CT Nov 2017   Tobacco abuse    Vitamin D deficiency disease     SURGICAL HISTORY: Past Surgical History:  Procedure Laterality Date   APPENDECTOMY  1974   PERICARDIOCENTESIS N/A 10/03/2021   Procedure: PERICARDIOCENTESIS;  Surgeon: Jettie Booze, MD;  Location: [facility];  Service: Cardiovascular;  Laterality: N/A;   PORTA CATH INSERTION N/A 06/06/2021   Procedure: PORTA CATH INSERTION;  Surgeon: Algernon Huxley, MD;  Location: [facility];  Service: Cardiovascular;  Laterality: N/A;   SHOULDER ARTHROSCOPY  10/23/2016   Procedure: ARTHROSCOPY SHOULDER WITH DISTAL CLAVICLE EXCISION, PARTIAL  ACROMIONECTOMY, AND DEBRIDEMENT;  Surgeon: Earnestine Leys, MD;  Location: [facility];  Service: Orthopedics;;   TIBIA FRACTURE SURGERY Left 2004    titanium rod, ORIF    SOCIAL HISTORY: Social History   Socioeconomic History   Marital status: Married    Spouse name: colleen   Number of children: Not on file   Years of education: 12   Highest education level: Bachelor's degree (e.g., BA, AB, BS)  Occupational History   Occupation: retired  Tobacco Use   Smoking status: Every Day    Packs/day: 0.50    Years: 45.00    Pack years: 22.50    Types: Cigarettes   Smokeless tobacco: Never   Tobacco comments:    11/6- currently smoking 5 cigarettes a day   Vaping Use   Vaping Use: Never used  Substance and Sexual Activity   Alcohol use: Yes    Alcohol/week: 3.0 standard drinks    Types: 3 Glasses of wine per week    Comment: a couple glasses of wine a week   Drug use: No   Sexual activity: Yes  Other Topics Concern   Not on file  Social History Narrative   Consumes ~4 cups of coffee/day   Social Determinants of Health   Financial Resource Strain: Not on file  Food Insecurity: Not on file  Transportation Needs: Not on file  Physical Activity: Not on file  Stress: Not on file  Social Connections: Not on file  Intimate Partner Violence: Not on file    FAMILY HISTORY: Family History  Problem Relation Age of Onset   Heart disease Father    Osteoarthritis Father    Alzheimer's disease Father    Cancer Neg Hx    Diabetes Neg Hx    Hypertension Neg Hx    Stroke Neg Hx    COPD Neg Hx     ALLERGIES:  is allergic to gluten meal.  MEDICATIONS:  Current Facility-Administered Medications  Medication Dose Route Frequency Provider Last Rate Last Admin   acetaminophen (TYLENOL) tablet 650 mg  650 mg Oral Q6H PRN Agbata, Tochukwu, MD       acyclovir (ZOVIRAX) 200 MG capsule 400 mg  400 mg Oral BID Chinita Greenland A, RPH   400 mg at 01/17/22 1709   apixaban (ELIQUIS) tablet 5 mg  5 mg Oral BID Agbata, Tochukwu, MD   5 mg at 01/17/22 5277   nicotine (NICODERM CQ - dosed in mg/24 hours) patch 14 mg  14 mg Transdermal Daily  Ottie Glazier, MD   14 mg at 01/17/22 1711   ondansetron (ZOFRAN) tablet 4 mg  4 mg Oral Q6H PRN Agbata, Tochukwu, MD       Or   ondansetron (ZOFRAN) injection 4 mg  4 mg Intravenous Q6H PRN Agbata, Tochukwu, MD       oxyCODONE (Oxy IR/ROXICODONE) immediate release tablet 5 mg  5 mg Oral Q6H PRN Agbata, Tochukwu, MD          .  PHYSICAL EXAMINATION:  Vitals:   01/17/22 1108 01/17/22 1600  BP: 104/70 96/69  Pulse: (!) 58 (!) 112  Resp: 16 17  Temp: 97.7 F (36.5 C) 97.9 F (36.6 C)  SpO2: 99% 97%   Filed Weights   01/16/22 1156 01/16/22 2355  Weight: 161 lb 1.6 oz (73.1 kg) 159 lb 2.8 oz (72.2 kg)    Physical Exam Vitals and nursing note reviewed.  HENT:     Head: Normocephalic and atraumatic.  Mouth/Throat:     Pharynx: Oropharynx is clear.  Eyes:     Extraocular Movements: Extraocular movements intact.     Pupils: Pupils are equal, round, and reactive to light.  Cardiovascular:     Rate and Rhythm: Normal rate and regular rhythm.  Pulmonary:     Comments: Decreased breath sounds bilaterally.  Abdominal:     Palpations: Abdomen is soft.  Musculoskeletal:        General: Normal range of motion.     Cervical back: Normal range of motion.  Skin:    General: Skin is warm.  Neurological:     General: No focal deficit present.     Mental Status: He is alert and oriented to person, place, and time.  Psychiatric:        Behavior: Behavior normal.        Judgment: Judgment normal.     LABORATORY DATA:  I have reviewed the data as listed Lab Results  Component Value Date   WBC 12.9 (H) 01/17/2022   HGB 13.7 01/17/2022   HCT 39.7 01/17/2022   MCV 86.1 01/17/2022   PLT 557 (H) 01/17/2022   Recent Labs    05/20/21 0957 06/15/21 0823 12/27/21 0824 01/05/22 1016 01/16/22 1203 01/16/22 1253 01/17/22 0616 01/17/22 1704  NA  --    < > 138 137 134*  --  134*  --   K  --    < > 3.8 3.8 3.9  --  4.0  --   CL  --    < > 105 102 98  --  101  --   CO2  --     < > 25 25 25   --  22  --   GLUCOSE  --    < > 97 122* 124*  --  131*  --   BUN  --    < > 10 13 10   --  11  --   CREATININE  --    < > 0.96 0.79 0.85  --  0.74  --   CALCIUM  --    < > 9.2 9.4 9.3  --  9.0  --   GFRNONAA  --    < > >60 >60 >60  --  >60  --   PROT 6.8   < > 6.4*  --   --  6.7  --  6.8  ALBUMIN 4.0   < > 3.9  --   --  3.0*  --  3.1*  AST 21   < > 25  --   --  17  --  14*  ALT 14   < > 37  --   --  14  --  15  ALKPHOS 70   < > 65  --   --  67  --  71  BILITOT 0.7   < > 0.4  --   --  0.8  --  0.3  BILIDIR <0.1  --   --   --   --  0.2  --  <0.1  IBILI NOT CALCULATED  --   --   --   --  0.6  --  NOT CALCULATED   < > = values in this interval not displayed.    RADIOGRAPHIC STUDIES: I have personally reviewed the radiological images as listed and agreed with the findings in the report. DG Chest 2 View  Result Date: 01/16/2022 CLINICAL DATA:  Shortness of breath. EXAM: CHEST - 2 VIEW  COMPARISON:  Chest radiographs and CT 01/05/2022 FINDINGS: A right jugular Port-A-Cath terminates near the superior cavoatrial junction. The cardiomediastinal silhouette is unchanged with normal heart size. There are new small bilateral pleural effusions, and there are new mild left basilar opacities which are partially curvilinear in configuration. No edema or pneumothorax is evident. No acute osseous abnormality is seen. IMPRESSION: New small bilateral pleural effusions with mild left basilar opacities likely reflecting atelectasis although infection is not excluded. Electronically Signed   By: Logan Bores M.D.   On: 01/16/2022 12:23   DG Chest 2 View  Result Date: 01/05/2022 CLINICAL DATA:  Chest pain EXAM: CHEST - 2 VIEW COMPARISON:  Chest x-ray dated October 24, 2021 FINDINGS: Right chest wall port with tip in the superior cavoatrial junction. Cardiac and mediastinal contours within normal limits. Mild bibasilar atelectasis. Lungs otherwise clear. No pleural effusion or pneumothorax. IMPRESSION:  No active cardiopulmonary disease. Electronically Signed   By: Yetta Glassman M.D.   On: 01/05/2022 10:59   CT Chest Wo Contrast  Result Date: 01/05/2022 CLINICAL DATA:  Right-sided chest pain and tachycardia. Denies shortness of breath. History of leukemia. EXAM: CT CHEST WITHOUT CONTRAST TECHNIQUE: Multidetector CT imaging of the chest was performed following the standard protocol without IV contrast. RADIATION DOSE REDUCTION: This exam was performed according to the departmental dose-optimization program which includes automated exposure control, adjustment of the mA and/or kV according to patient size and/or use of iterative reconstruction technique. COMPARISON:  CTA chest dated October 16, 2021. FINDINGS: Cardiovascular: Normal heart size. Unchanged small pericardial effusion. No thoracic aortic aneurysm. Unchanged right chest wall port catheter with tip at the cavoatrial junction. Mediastinum/Nodes: Decreasing bilateral axillary lymphadenopathy. Index nodule in the left axilla currently measures 1.1 cm in short axis (series 2, image 29), previously 1.5 cm. No enlarged mediastinal lymph nodes. The thyroid gland, trachea, and esophagus demonstrate no significant findings. Lungs/Pleura: Unchanged mild paraseptal and centrilobular emphysema. Resolved left pleural effusion with improved aeration at the left lower lobe. No consolidation or pneumothorax. Upper Abdomen: No acute abnormality. Musculoskeletal: No acute or significant osseous findings. Old sternal body fracture again noted. IMPRESSION: 1.  No acute intrathoracic process. 2. Decreasing bilateral axillary lymphadenopathy. 3. Resolved left pleural effusion. Unchanged small pericardial effusion. 4. Emphysema (ICD10-J43.9). Electronically Signed   By: [name] Dubin M.D.   On: 01/05/2022 12:32   CT Angio Chest PE W and/or Wo Contrast  Result Date: 01/16/2022 CLINICAL DATA:  Pulmonary embolism suspected. History of recent pulmonary embolus. History  of pericardial effusion. EXAM: CT ANGIOGRAPHY CHEST WITH CONTRAST TECHNIQUE: Multidetector CT imaging of the chest was performed using the standard protocol during bolus administration of intravenous contrast. Multiplanar CT image reconstructions and MIPs were obtained to evaluate the vascular anatomy. RADIATION DOSE REDUCTION: This exam was performed according to the departmental dose-optimization program which includes automated exposure control, adjustment of the mA and/or kV according to patient size and/or use of iterative reconstruction technique. CONTRAST:  69m OMNIPAQUE IOHEXOL 350 MG/ML SOLN COMPARISON:  CT angio chest on 10/16/2021 FINDINGS: Cardiovascular: Heart size is normal. Trace pericardial effusion similar to prior study. The pulmonary arteries are well opacified. There is no acute pulmonary embolus. There has been resolution of thrombus within the RIGHT LOWER lobe pulmonary artery. Mediastinum/Nodes: The visualized portion of the thyroid gland has a normal appearance. Esophagus is unremarkable. No significant mediastinal, hilar, or axillary adenopathy. Lungs/Pleura: There small bilateral pleural effusions, LEFT greater than RIGHT. There is associated atelectasis at both lung bases, LEFT greater  than RIGHT. No focal consolidations. Minimal paraseptal emphysema at the RIGHT lung apex. Upper Abdomen: Partially imaged LEFT renal cyst is at least 5.7 centimeters. Gallbladder is present. Musculoskeletal: Deformity of the sternum consistent with remote fracture. Moderate degenerative changes in the thoracic spine. There is a remote superior endplate fracture of T8. Review of the MIP images confirms the above findings. IMPRESSION: 1. Technically adequate exam showing no acute pulmonary embolus. 2. Stable small pericardial effusion. 3. New bilateral pleural effusions associated with atelectasis, LEFT greater than RIGHT. 4. Remote sternal fracture. 5. Remote superior endplate fracture of T8. Electronically  Signed   By: Nolon Nations M.D.   On: 01/16/2022 13:59   [place] CHEST (PLEURAL EFFUSION)  Result Date: 01/17/2022 CLINICAL DATA:  Pleural effusion EXAM: CHEST ULTRASOUND COMPARISON:  None. FINDINGS: Limited sonographic exam of the chest was performed for fluid survey. Images demonstrate tiny right pleural effusion. There is a small left pleural effusion, with overlying atelectatic lung. No safe window identified for image guided thoracentesis. No thoracentesis performed. IMPRESSION: Tiny right and small left pleural effusions, without safe window for percutaneous drainage. No thoracentesis performed. Electronically Signed   By: Albin Felling M.D.   On: 01/17/2022 15:23   ECHOCARDIOGRAM COMPLETE  Result Date: 01/17/2022    ECHOCARDIOGRAM REPORT   Patient Name:   Andres Mullins [facility] Date of Exam: 01/17/2022 Medical Rec #:  [id number]       Height:       66.0 in Accession #:    [id number]      Weight:       159.2 lb Date of Birth:  [birth year]       BSA:          1.815 m Patient Age:    71 years        BP:           107/76 mmHg Patient Gender: M               HR:           100 bpm. Exam Location:  [facility] Procedure: 2D Echo, Color Doppler and Cardiac Doppler STAT ECHO Indications:     I31.3 Pericardial effusion  History:         Patient has prior history of Echocardiogram examinations, most                  recent 10/25/2021. Arrythmias:RBBB; Risk Factors:Dyslipidemia                  and Current Smoker.  Sonographer:     Charmayne Sheer Referring Phys:  [place] Diagnosing Phys: [name] MD IMPRESSIONS  1. Left ventricular ejection fraction, by estimation, is 55 to 60%. The left ventricle has normal function. Left ventricular endocardial border not optimally defined to evaluate regional wall motion. Left ventricular diastolic parameters are consistent with Grade II diastolic dysfunction (pseudonormalization).  2. Right ventricular systolic function is normal. The right ventricular size is normal. Mildly  increased right ventricular wall thickness.  3. The pericardium appears mildly thickened.. There is no evidence of cardiac tamponade.  4. The mitral valve was not well visualized. Trivial mitral valve regurgitation. No evidence of mitral stenosis.  5. The aortic valve has an indeterminant number of cusps. Aortic valve regurgitation is mild. No aortic stenosis is present.  6. Aortic dilatation noted. There is mild dilatation of the aortic root, measuring 40 mm. There is mild dilatation of the ascending aorta, measuring 36 mm.  7. The inferior vena cava is normal in size with greater than 50% respiratory variability, suggesting right atrial pressure of 3 mmHg. FINDINGS  Left Ventricle: Left ventricular ejection fraction, by estimation, is 55 to 60%. The left ventricle has normal function. Left ventricular endocardial border not optimally defined to evaluate regional wall motion. The left ventricular internal cavity size was normal in size. There is no left ventricular hypertrophy. Left ventricular diastolic parameters are consistent with Grade II diastolic dysfunction (pseudonormalization). Right Ventricle: The right ventricular size is normal. Mildly increased right ventricular wall thickness. Right ventricular systolic function is normal. Left Atrium: Left atrial size was normal in size. Right Atrium: Right atrial size was normal in size. Pericardium: The pericardium appears mildly thickened. Trivial pericardial effusion is present. There is no evidence of cardiac tamponade. Mitral Valve: The mitral valve was not well visualized. Trivial mitral valve regurgitation. No evidence of mitral valve stenosis. MV peak gradient, 2.0 mmHg. The mean mitral valve gradient is 1.0 mmHg. Tricuspid Valve: The tricuspid valve is not well visualized. Tricuspid valve regurgitation is not demonstrated. Aortic Valve: The aortic valve has an indeterminant number of cusps. Aortic valve regurgitation is mild. No aortic stenosis is present.  Aortic valve mean gradient measures 2.0 mmHg. Aortic valve peak gradient measures 4.3 mmHg. Aortic valve area, by VTI measures 4.29 cm. Pulmonic Valve: The pulmonic valve was not well visualized. Pulmonic valve regurgitation is not visualized. No evidence of pulmonic stenosis. Aorta: Aortic dilatation noted. There is mild dilatation of the aortic root, measuring 40 mm. There is mild dilatation of the ascending aorta, measuring 36 mm. Pulmonary Artery: The pulmonary artery is not well seen. Venous: The inferior vena cava is normal in size with greater than 50% respiratory variability, suggesting right atrial pressure of 3 mmHg. IAS/Shunts: The interatrial septum was not well visualized. Additional Comments: There is a small pleural effusion in the left lateral region.  LEFT VENTRICLE PLAX 2D LVIDd:         5.12 cm   Diastology LVIDs:         3.84 cm   LV e' medial:    6.74 cm/s LV PW:         0.96 cm   LV E/e' medial:  8.5 LV IVS:        0.77 cm   LV e' lateral:   10.00 cm/s LVOT diam:     2.00 cm   LV E/e' lateral: 5.7 LV SV:         53 LV SV Index:   29 LVOT Area:     3.14 cm  RIGHT VENTRICLE RV Basal diam:  3.38 cm LEFT ATRIUM             Index        RIGHT ATRIUM           Index LA diam:        3.90 cm 2.15 cm/m   RA Area:     17.60 cm LA Vol (A2C):   23.7 ml 13.06 ml/m  RA Volume:   47.40 ml  26.12 ml/m LA Vol (A4C):   26.1 ml 14.38 ml/m LA Biplane Vol: 26.9 ml 14.82 ml/m  AORTIC VALVE                    PULMONIC VALVE AV Area (Vmax):    3.32 cm     PV Vmax:       1.00 m/s AV Area (Vmean):   3.29 cm  PV Vmean:      67.600 cm/s AV Area (VTI):     4.29 cm     PV VTI:        0.135 m AV Vmax:           104.00 cm/s  PV Peak grad:  4.0 mmHg AV Vmean:          67.800 cm/s  PV Mean grad:  2.0 mmHg AV VTI:            0.123 m AV Peak Grad:      4.3 mmHg AV Mean Grad:      2.0 mmHg LVOT Vmax:         110.00 cm/s LVOT Vmean:        70.900 cm/s LVOT VTI:          0.168 m LVOT/AV VTI ratio: 1.37  AORTA Ao Root  diam: 4.00 cm MITRAL VALVE MV Area (PHT): 5.79 cm    SHUNTS MV Area VTI:   2.95 cm    Systemic VTI:  0.17 m MV Peak grad:  2.0 mmHg    Systemic Diam: 2.00 cm MV Mean grad:  1.0 mmHg MV Vmax:       0.70 m/s MV Vmean:      47.9 cm/s MV Decel Time: 131 msec MV E velocity: 57.40 cm/s MV A velocity: 51.00 cm/s MV E/A ratio:  1.13 Harrell Gave End MD Electronically signed by [name] MD Signature Date/Time: 01/17/2022/10:51:23 AM    Final     #71 year old male patient with history of CLL currently on Gazyva plus venetoclax is currently admitted to hospital for chest pain/shortness of breath.  #CLL-currently on Gazyva plus venetoclax.  Patient had significant response/near complete response on recent imaging.  Clinically patient's current symptoms including episodes of significant night sweats/low-grade fevers  NOT suggestive CLL as the possible etiology.  Also, current chemotherapy drugs unlikely to cause patient's current symptoms.  Obviously patient is immunosuppressed-question infectious etiology  #Tachypnea/chest pain intermittent-the etiology is unclear.  Bilateral pleural effusion small-likely secondary to atelectasis.  Thoracentesis not done/not safe.   #Active smoker-  #Recommendations:  #Given the acute respiratory issues-I think it is reasonable to hold venetoclax at this time.   #Appreciate pulmonary; cardiology and ID evaluation recommendations.  Await 2D echo.  # I reviewed the blood work- with the patient in detail; also reviewed the imaging independently [as summarized above]; and with the patient in detail.   All questions were answered. The patient knows to call the clinic with any problems, questions or concerns.  The above plan of care was discussed with the patient and his wife by the bedside.  Also discussed with Dr. Lunette Stands, MD 01/17/2022 7:56 PM
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
## 2022-01-17 NOTE — Progress Notes (Signed)
Took patient home medications down to pharmacy.  Will be given while here.

## 2022-01-17 NOTE — Assessment & Plan Note (Signed)
Multifactorial.  Bilateral pleural effusion, pericardial effusion, CHF, CLL and PE.  He is on 2 L oxygen via nasal cannula.  Wean as able to room air

## 2022-01-17 NOTE — Assessment & Plan Note (Signed)
Patient reports low-grade fever with chills.  He reports to episodes where he had drenching night sweats since February 11.  We will consult ID.  Hold off any antibiotics for now

## 2022-01-17 NOTE — Consult Note (Signed)
NAME: Andres Mullins  DOB: February 06, 1951  MRN: [id number]  Date/Time: 01/17/2022 4:44 PM  REQUESTING PROVIDER: Dr.Shah Subjective:  REASON FOR CONSULT: pleural effusion ? Andres Mullins is a 71 y.o. 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 Pt initally had presented to ED on 01/05/22 with  rt sided chest tightness/pain and sob and was concerned that he had pneumonia- Work up did not reveal any pneumonia by imaging and troponin was normal and he was sent home from ED. He went to his PCP yesterday and was referred to ED He has had chills and drenching night sweats for the past 2 nights.  Has diarrhea Pt thinks all his symptoms are from the venetoclax Minimal cough No travel No pets Vaccinated for covid Also got evushield last year  CLL - started chemo June 2022 July [place] NOV 2022- Pericardial effusion/tamponade and cardiocentesis- neg for maligancy NOV 2022-  PE 12/27/21 last dose of GAzyva  In the ED vitals 99/72, Temp 98.5, HR 108 and sats 94% on 2 L oxygen Wbc 12.4, HB 13.8 and plt 582 and cr 0.85, procal < 0.1 CTA showed b/l pleural effusion left > rt I am asked to see him to r/o infection Pleurocentesis could not be done as very little fluid   Past Medical History:  Diagnosis Date   Atherosclerosis of coronary artery 09/28/2016   Chest CT Nov 2017   Atrial tachycardia (HCC)    CLL (chronic lymphocytic leukemia) (HCC)    Elevated C-reactive protein (CRP)    Elevated PSA    Fractured sternum 1999   Herpes    History of rib fracture 1999   multiple   Hyperlipidemia    Psoriasis    Pulmonary embolism ([place])    Rheumatoid factor positive 02/2014   Rosacea    Thoracic aortic atherosclerosis ([place]) 09/28/2016   Chest CT Nov 2017   Tobacco abuse    Vitamin D deficiency disease     Past Surgical History:  Procedure Laterality Date    APPENDECTOMY  1974   PERICARDIOCENTESIS N/A 10/03/2021   Procedure: PERICARDIOCENTESIS;  Surgeon: Jettie Booze, MD;  Location: [facility];  Service: Cardiovascular;  Laterality: N/A;   PORTA CATH INSERTION N/A 06/06/2021   Procedure: PORTA CATH INSERTION;  Surgeon: Algernon Huxley, MD;  Location: [facility];  Service: Cardiovascular;  Laterality: N/A;   SHOULDER ARTHROSCOPY  10/23/2016   Procedure: ARTHROSCOPY SHOULDER WITH DISTAL CLAVICLE EXCISION, PARTIAL ACROMIONECTOMY, AND DEBRIDEMENT;  Surgeon: Earnestine Leys, MD;  Location: [facility];  Service: Orthopedics;;   TIBIA FRACTURE SURGERY Left 2004   titanium rod, ORIF    Social History   Socioeconomic History   Marital status: Married    Spouse name: colleen   Number of children: Not on file   Years of education: 12   Highest education level: Bachelor's degree (e.g., BA, AB, BS)  Occupational History   Occupation: retired  Tobacco Use   Smoking status: Every Day    Packs/day: 0.50    Years: 45.00    Pack years: 22.50    Types: Cigarettes   Smokeless tobacco: Never   Tobacco comments:    11/6- currently smoking 5 cigarettes a day   Vaping Use   Vaping Use: Never used  Substance and Sexual Activity   Alcohol use: Yes    Alcohol/week: 3.0 standard drinks  Types: 3 Glasses of wine per week    Comment: a couple glasses of wine a week   Drug use: No   Sexual activity: Yes  Other Topics Concern   Not on file  Social History Narrative   Consumes ~4 cups of coffee/day   Social Determinants of Health   Financial Resource Strain: Not on file  Food Insecurity: Not on file  Transportation Needs: Not on file  Physical Activity: Not on file  Stress: Not on file  Social Connections: Not on file  Intimate Partner Violence: Not on file    Family History  Problem Relation Age of Onset   Heart disease Father    Osteoarthritis Father    Alzheimer's disease Father    Cancer Neg Hx    Diabetes Neg Hx     Hypertension Neg Hx    Stroke Neg Hx    COPD Neg Hx    Allergies  Allergen Reactions   Gluten Meal     Gluten intolerance    I? Current Facility-Administered Medications  Medication Dose Route Frequency Provider Last Rate Last Admin   acetaminophen (TYLENOL) tablet 650 mg  650 mg Oral Q6H PRN Agbata, Tochukwu, MD       acyclovir (ZOVIRAX) 200 MG capsule 400 mg  400 mg Oral BID Chinita Greenland A, RPH       apixaban (ELIQUIS) tablet 5 mg  5 mg Oral BID Agbata, Tochukwu, MD   5 mg at 01/17/22 0909   ondansetron (ZOFRAN) tablet 4 mg  4 mg Oral Q6H PRN Agbata, Tochukwu, MD       Or   ondansetron (ZOFRAN) injection 4 mg  4 mg Intravenous Q6H PRN Agbata, Tochukwu, MD       oxyCODONE (Oxy IR/ROXICODONE) immediate release tablet 5 mg  5 mg Oral Q6H PRN Agbata, Tochukwu, MD         Abtx:  Anti-infectives (From admission, onward)    Start     Dose/Rate Route Frequency Ordered Stop   01/17/22 1500  acyclovir (ZOVIRAX) 200 MG capsule 400 mg        400 mg Oral 2 times daily 01/17/22 1358     01/17/22 1315  acyclovir (ZOVIRAX) tablet 400 mg  Status:  Discontinued        400 mg Oral 2 times daily 01/17/22 1215 01/17/22 1358       REVIEW OF SYSTEMS:  Const: low grade  fever, + chills, + night sweats weight loss Eyes: negative diplopia or visual changes, negative eye pain ENT: negative coryza, negative sore throat Resp:  cough,s dyspnea Cards: negative for chest pain, palpitations, lower extremity edema GU: negative for frequency, dysuria and hematuria GI: Negative for abdominal pain, diarrhea, bleeding, constipation Skin: negative for rash and pruritus Heme: negative for easy bruising and gum/nose bleeding MS: general weakness Neurolo:negative for headaches, dizziness, vertigo, memory problems  Psych: negative for feelings of anxiety, depression  Endocrine: negative for thyroid, diabetes Allergy/Immunology- Gluten Objective:  VITALS:  BP 96/69 (BP Location: Left Arm)    Pulse (!)  112    Temp 97.9 F (36.6 C) (Oral)    Resp 17    Ht 5' 6"  (1.676 m)    Wt 72.2 kg    SpO2 97%    BMI 25.69 kg/m  PHYSICAL EXAM:  General: Alert, cooperative, some resp distress, appears stated age.  Head: Normocephalic, without obvious abnormality, atraumatic. Eyes: Conjunctivae clear, anicteric sclerae. Pupils are equal ENT Nares normal. No drainage or sinus tenderness.  Lips, mucosa, and tongue normal. No Thrush Neck: Supple, symmetrical, no adenopathy, thyroid: non tender no carotid bruit and no JVD. Back: No CVA tenderness. Lungs: Clear to auscultation bilaterally. No Wheezing or Rhonchi. No rales. Heart: Regular rate and rhythm, no murmur, rub or gallop. Abdomen: Soft, non-tender,not distended. Bowel sounds normal. No masses Extremities: atraumatic, no cyanosis. No edema. No clubbing Skin: No rashes or lesions. Or bruising Lymph: Cervical, supraclavicular normal. Neurologic: Grossly non-focal Pertinent Labs Lab Results CBC    Component Value Date/Time   WBC 12.9 (H) 01/17/2022 0616   RBC 4.61 01/17/2022 0616   HGB 13.7 01/17/2022 0616   HGB 15.3 10/27/2021 0857   HCT 39.7 01/17/2022 0616   HCT 43.8 10/27/2021 0857   PLT 557 (H) 01/17/2022 0616   PLT 340 10/27/2021 0857   MCV 86.1 01/17/2022 0616   MCV 86 10/27/2021 0857   MCH 29.7 01/17/2022 0616   MCHC 34.5 01/17/2022 0616   RDW 14.0 01/17/2022 0616   RDW 14.5 10/27/2021 0857   LYMPHSABS 1.5 01/16/2022 1253   LYMPHSABS 4.6 (H) 08/17/2015 1135   MONOABS 1.2 (H) 01/16/2022 1253   EOSABS 0.0 01/16/2022 1253   EOSABS 0.2 08/17/2015 1135   BASOSABS 0.1 01/16/2022 1253   BASOSABS 0.0 08/17/2015 1135    CMP Latest Ref Rng & Units 01/17/2022 01/16/2022 01/05/2022  Glucose 70 - 99 mg/dL 131(H) 124(H) 122(H)  BUN 8 - 23 mg/dL 11 10 13   Creatinine 0.61 - 1.24 mg/dL 0.74 0.85 0.79  Sodium 135 - 145 mmol/L 134(L) 134(L) 137  Potassium 3.5 - 5.1 mmol/L 4.0 3.9 3.8  Chloride 98 - 111 mmol/L 101 98 102  CO2 22 - 32 mmol/L 22  25 25   Calcium 8.9 - 10.3 mg/dL 9.0 9.3 9.4  Total Protein 6.5 - 8.1 g/dL - 6.7 -  Total Bilirubin 0.3 - 1.2 mg/dL - 0.8 -  Alkaline Phos 38 - 126 U/L - 67 -  AST 15 - 41 U/L - 17 -  ALT 0 - 44 U/L - 14 -      Microbiology: Recent Results (from the past 240 hour(s))  Resp Panel by RT-PCR (Flu A&B, Covid) Nasopharyngeal Swab     Status: None   Collection Time: 01/16/22  1:07 PM   Specimen: Nasopharyngeal Swab; Nasopharyngeal(NP) swabs in vial transport medium  Result Value Ref Range Status   SARS Coronavirus 2 by RT PCR NEGATIVE NEGATIVE Final    Comment: (NOTE) SARS-CoV-2 target nucleic acids are NOT DETECTED.  The SARS-CoV-2 RNA is generally detectable in upper respiratory specimens during the acute phase of infection. The lowest concentration of SARS-CoV-2 viral copies this assay can detect is 138 copies/mL. A negative result does not preclude SARS-Cov-2 infection and should not be used as the sole basis for treatment or other patient management decisions. A negative result may occur with  improper specimen collection/handling, submission of specimen other than nasopharyngeal swab, presence of viral mutation(s) within the areas targeted by this assay, and inadequate number of viral copies(<138 copies/mL). A negative result must be combined with clinical observations, patient history, and epidemiological information. The expected result is Negative.  Fact Sheet for Patients:  EntrepreneurPulse.com.au  Fact Sheet for Healthcare Providers:  IncredibleEmployment.be  This test is no t yet approved or cleared by the [place] FDA and  has been authorized for detection and/or diagnosis of SARS-CoV-2 by FDA under an Emergency Use Authorization (EUA). This EUA will remain  in effect (meaning this test can be used) for  the duration of the COVID-19 declaration under Section 564(b)(1) of the Act, 21 U.S.C.section 360bbb-3(b)(1), unless the  authorization is terminated  or revoked sooner.       Influenza A by PCR NEGATIVE NEGATIVE Final   Influenza B by PCR NEGATIVE NEGATIVE Final    Comment: (NOTE) The Xpert Xpress SARS-CoV-2/FLU/RSV plus assay is intended as an aid in the diagnosis of influenza from Nasopharyngeal swab specimens and should not be used as a sole basis for treatment. Nasal washings and aspirates are unacceptable for Xpert Xpress SARS-CoV-2/FLU/RSV testing.  Fact Sheet for Patients: EntrepreneurPulse.com.au  Fact Sheet for Healthcare Providers: IncredibleEmployment.be  This test is not yet approved or cleared by the [place] FDA and has been authorized for detection and/or diagnosis of SARS-CoV-2 by FDA under an Emergency Use Authorization (EUA). This EUA will remain in effect (meaning this test can be used) for the duration of the COVID-19 declaration under Section 564(b)(1) of the Act, 21 U.S.C. section 360bbb-3(b)(1), unless the authorization is terminated or revoked.  Performed at [facility], [address] [place], [id number] [address]     IMAGING RESULTS:  I have personally reviewed the films ?New bilateral pleural effusions associated with atelectasis, LEFT greater than RIGHT. 4. Remote sternal fracture. 5. Remote superior endplate fracture of T8.  Impression/Recommendation 71 yr male with h/o CLL on venetoclax and gazyva presents with chest tightness, sob, night sweats B/l pleural effusion new- with pericardial effusion in Nov 2022. Cardiocentesis done then showed a neutrophil predominant fluid, but neg culture and cytology No other work up done  D.D malignancy, infection and autoimmune Malignancy- primary effusion lymphoma, complication of medication , castlemans  In infection need to r/o Virus vs tb- will get quantiferon gold If pleural fluid is sent for culture will need AFB ? ?other causes like lupus, Rh arthiris, vasculitis,  sarcoid  Hold off on antibiotics for now ___________________________________________________ Discussed with patient, requesting provider Note:  This document was prepared using Dragon voice recognition software and may include unintentional dictation errors.

## 2022-01-17 NOTE — Consult Note (Signed)
PULMONOLOGY         Date: 01/17/2022,   MRN# [id number] Andres Mullins 03-26-1951     AdmissionWeight: 73.1 kg                 CurrentWeight: 72.2 kg   Referring physician: Dr Manuella Ghazi   CHIEF COMPLAINT:   Severe dyspnea with bilateral pleural effusions   HISTORY OF PRESENT ILLNESS   This is a 71 yo M with hx of CLL on chemo , remote PE on eliquis, hx of pericarditis with pericardial effusion and s/p pericardiocentesis.  He has rib and sternal fracture history.   Patient was seen in hospital early this month for possible LRTI and had treatment but came back due to progressive severe symptoms of dyspnea.   He admits to chronic diarreah.  He had thoracentesis ordered with no safe window for aspiration.   This is likely due to diuresis of >1L of fluid.   Imaging independently reviewed by me with moderate left and mild right pleural effusion with compressive atelectasis bilaterally otherwise no severe abnormalities.    PAST MEDICAL HISTORY   Past Medical History:  Diagnosis Date   Atherosclerosis of coronary artery 09/28/2016   Chest CT Nov 2017   Atrial tachycardia (HCC)    CLL (chronic lymphocytic leukemia) (HCC)    Elevated C-reactive protein (CRP)    Elevated PSA    Fractured sternum 1999   Herpes    History of rib fracture 1999   multiple   Hyperlipidemia    Psoriasis    Pulmonary embolism ([place])    Rheumatoid factor positive 02/2014   Rosacea    Thoracic aortic atherosclerosis ([place]) 09/28/2016   Chest CT Nov 2017   Tobacco abuse    Vitamin D deficiency disease      SURGICAL HISTORY   Past Surgical History:  Procedure Laterality Date   APPENDECTOMY  1974   PERICARDIOCENTESIS N/A 10/03/2021   Procedure: PERICARDIOCENTESIS;  Surgeon: Jettie Booze, MD;  Location: [facility];  Service: Cardiovascular;  Laterality: N/A;   PORTA CATH INSERTION N/A 06/06/2021   Procedure: PORTA CATH INSERTION;  Surgeon: Algernon Huxley, MD;  Location:  [facility];  Service: Cardiovascular;  Laterality: N/A;   SHOULDER ARTHROSCOPY  10/23/2016   Procedure: ARTHROSCOPY SHOULDER WITH DISTAL CLAVICLE EXCISION, PARTIAL ACROMIONECTOMY, AND DEBRIDEMENT;  Surgeon: Earnestine Leys, MD;  Location: [facility];  Service: Orthopedics;;   TIBIA FRACTURE SURGERY Left 2004   titanium rod, ORIF     FAMILY HISTORY   Family History  Problem Relation Age of Onset   Heart disease Father    Osteoarthritis Father    Alzheimer's disease Father    Cancer Neg Hx    Diabetes Neg Hx    Hypertension Neg Hx    Stroke Neg Hx    COPD Neg Hx      SOCIAL HISTORY   Social History   Tobacco Use   Smoking status: Every Day    Packs/day: 0.50    Years: 45.00    Pack years: 22.50    Types: Cigarettes   Smokeless tobacco: Never   Tobacco comments:    11/6- currently smoking 5 cigarettes a day   Vaping Use   Vaping Use: Never used  Substance Use Topics   Alcohol use: Yes    Alcohol/week: 3.0 standard drinks    Types: 3 Glasses of wine per week    Comment: a couple glasses of wine a week  Drug use: No     MEDICATIONS    Home Medication:    Current Medication:  Current Facility-Administered Medications:    acetaminophen (TYLENOL) tablet 650 mg, 650 mg, Oral, Q6H PRN, Agbata, Tochukwu, MD   apixaban (ELIQUIS) tablet 5 mg, 5 mg, Oral, BID, Agbata, Tochukwu, MD, 5 mg at 01/17/22 0539   furosemide (LASIX) injection 40 mg, 40 mg, Intravenous, Daily, Agbata, Tochukwu, MD, 40 mg at 01/17/22 0909   ondansetron (ZOFRAN) tablet 4 mg, 4 mg, Oral, Q6H PRN **OR** ondansetron (ZOFRAN) injection 4 mg, 4 mg, Intravenous, Q6H PRN, Agbata, Tochukwu, MD   oxyCODONE (Oxy IR/ROXICODONE) immediate release tablet 5 mg, 5 mg, Oral, Q6H PRN, Agbata, Tochukwu, MD   venetoclax (VENCLEXTA) tablet 400 mg, 400 mg, Oral, Daily, Agbata, Tochukwu, MD    ALLERGIES   Gluten meal     REVIEW OF SYSTEMS    Review of Systems:  Gen:  Denies  fever, sweats, chills  weigh loss  HEENT: Denies blurred vision, double vision, ear pain, eye pain, hearing loss, nose bleeds, sore throat Cardiac:  No dizziness, chest pain or heaviness, chest tightness,edema Resp:   Denies cough or sputum porduction, shortness of breath,wheezing, hemoptysis,  Gi: Denies swallowing difficulty, stomach pain, nausea or vomiting, diarrhea, constipation, bowel incontinence Gu:  Denies bladder incontinence, burning urine Ext:   Denies Joint pain, stiffness or swelling Skin: Denies  skin rash, easy bruising or bleeding or hives Endoc:  Denies polyuria, polydipsia , polyphagia or weight change Psych:   Denies depression, insomnia or hallucinations   Other:  All other systems negative   VS: BP 107/76 (BP Location: Left Arm)    Pulse 79    Temp 98.5 F (36.9 C) (Oral)    Resp 15    Ht 5' 6"  (1.676 m)    Wt 72.2 kg    SpO2 96%    BMI 25.69 kg/m      PHYSICAL EXAM    GENERAL:NAD, no fevers, chills, no weakness no fatigue HEAD: Normocephalic, atraumatic.  EYES: Pupils equal, round, reactive to light. Extraocular muscles intact. No scleral icterus.  MOUTH: Moist mucosal membrane. Dentition intact. No abscess noted.  EAR, NOSE, THROAT: Clear without exudates. No external lesions.  NECK: Supple. No thyromegaly. No nodules. No JVD.  PULMONARY: Diffuse coarse rhonchi right sided +wheezes CARDIOVASCULAR: S1 and S2. Regular rate and rhythm. No murmurs, rubs, or gallops. No edema. Pedal pulses 2+ bilaterally.  GASTROINTESTINAL: Soft, nontender, nondistended. No masses. Positive bowel sounds. No hepatosplenomegaly.  MUSCULOSKELETAL: No swelling, clubbing, or edema. Range of motion full in all extremities.  NEUROLOGIC: Cranial nerves II through XII are intact. No gross focal neurological deficits. Sensation intact. Reflexes intact.  SKIN: No ulceration, lesions, rashes, or cyanosis. Skin warm and dry. Turgor intact.  PSYCHIATRIC: Mood, affect within normal limits. The patient is awake, alert  and oriented x 3. Insight, judgment intact.       IMAGING    DG Chest 2 View  Result Date: 01/16/2022 CLINICAL DATA:  Shortness of breath. EXAM: CHEST - 2 VIEW COMPARISON:  Chest radiographs and CT 01/05/2022 FINDINGS: A right jugular Port-A-Cath terminates near the superior cavoatrial junction. The cardiomediastinal silhouette is unchanged with normal heart size. There are new small bilateral pleural effusions, and there are new mild left basilar opacities which are partially curvilinear in configuration. No edema or pneumothorax is evident. No acute osseous abnormality is seen. IMPRESSION: New small bilateral pleural effusions with mild left basilar opacities likely reflecting atelectasis although infection is  not excluded. Electronically Signed   By: Logan Bores M.D.   On: 01/16/2022 12:23   DG Chest 2 View  Result Date: 01/05/2022 CLINICAL DATA:  Chest pain EXAM: CHEST - 2 VIEW COMPARISON:  Chest x-ray dated October 24, 2021 FINDINGS: Right chest wall port with tip in the superior cavoatrial junction. Cardiac and mediastinal contours within normal limits. Mild bibasilar atelectasis. Lungs otherwise clear. No pleural effusion or pneumothorax. IMPRESSION: No active cardiopulmonary disease. Electronically Signed   By: Yetta Glassman M.D.   On: 01/05/2022 10:59   CT Chest Wo Contrast  Result Date: 01/05/2022 CLINICAL DATA:  Right-sided chest pain and tachycardia. Denies shortness of breath. History of leukemia. EXAM: CT CHEST WITHOUT CONTRAST TECHNIQUE: Multidetector CT imaging of the chest was performed following the standard protocol without IV contrast. RADIATION DOSE REDUCTION: This exam was performed according to the departmental dose-optimization program which includes automated exposure control, adjustment of the mA and/or kV according to patient size and/or use of iterative reconstruction technique. COMPARISON:  CTA chest dated October 16, 2021. FINDINGS: Cardiovascular: Normal heart  size. Unchanged small pericardial effusion. No thoracic aortic aneurysm. Unchanged right chest wall port catheter with tip at the cavoatrial junction. Mediastinum/Nodes: Decreasing bilateral axillary lymphadenopathy. Index nodule in the left axilla currently measures 1.1 cm in short axis (series 2, image 29), previously 1.5 cm. No enlarged mediastinal lymph nodes. The thyroid gland, trachea, and esophagus demonstrate no significant findings. Lungs/Pleura: Unchanged mild paraseptal and centrilobular emphysema. Resolved left pleural effusion with improved aeration at the left lower lobe. No consolidation or pneumothorax. Upper Abdomen: No acute abnormality. Musculoskeletal: No acute or significant osseous findings. Old sternal body fracture again noted. IMPRESSION: 1.  No acute intrathoracic process. 2. Decreasing bilateral axillary lymphadenopathy. 3. Resolved left pleural effusion. Unchanged small pericardial effusion. 4. Emphysema (ICD10-J43.9). Electronically Signed   By: [name] Dubin M.D.   On: 01/05/2022 12:32   CT Angio Chest PE W and/or Wo Contrast  Result Date: 01/16/2022 CLINICAL DATA:  Pulmonary embolism suspected. History of recent pulmonary embolus. History of pericardial effusion. EXAM: CT ANGIOGRAPHY CHEST WITH CONTRAST TECHNIQUE: Multidetector CT imaging of the chest was performed using the standard protocol during bolus administration of intravenous contrast. Multiplanar CT image reconstructions and MIPs were obtained to evaluate the vascular anatomy. RADIATION DOSE REDUCTION: This exam was performed according to the departmental dose-optimization program which includes automated exposure control, adjustment of the mA and/or kV according to patient size and/or use of iterative reconstruction technique. CONTRAST:  12m OMNIPAQUE IOHEXOL 350 MG/ML SOLN COMPARISON:  CT angio chest on 10/16/2021 FINDINGS: Cardiovascular: Heart size is normal. Trace pericardial effusion similar to prior study. The  pulmonary arteries are well opacified. There is no acute pulmonary embolus. There has been resolution of thrombus within the RIGHT LOWER lobe pulmonary artery. Mediastinum/Nodes: The visualized portion of the thyroid gland has a normal appearance. Esophagus is unremarkable. No significant mediastinal, hilar, or axillary adenopathy. Lungs/Pleura: There small bilateral pleural effusions, LEFT greater than RIGHT. There is associated atelectasis at both lung bases, LEFT greater than RIGHT. No focal consolidations. Minimal paraseptal emphysema at the RIGHT lung apex. Upper Abdomen: Partially imaged LEFT renal cyst is at least 5.7 centimeters. Gallbladder is present. Musculoskeletal: Deformity of the sternum consistent with remote fracture. Moderate degenerative changes in the thoracic spine. There is a remote superior endplate fracture of T8. Review of the MIP images confirms the above findings. IMPRESSION: 1. Technically adequate exam showing no acute pulmonary embolus. 2. Stable small pericardial effusion.  3. New bilateral pleural effusions associated with atelectasis, LEFT greater than RIGHT. 4. Remote sternal fracture. 5. Remote superior endplate fracture of T8. Electronically Signed   By: Nolon Nations M.D.   On: 01/16/2022 13:59      ASSESSMENT/PLAN   Acute hypoxemic respiratory failure -Due to bilateral pleural effusions and associated compressive atelectasis. -he has albuminemia and diastolic heart failure stage 2. -he also has atelectasis and would benefit from incentive spirometry  -mild leukocytosis with negative procalcitonin and normal lactate.   -RVP ordered   Tachyarrythmia -his pulse was with skipped beats during physical examinatioI suspect PVCs but will obtain 12 lead EKG -cardiology on case appreciate input   Tobacco smoking  - patient actively smoking  - counseling provided for smoking cessation  Mild protein calorie malnutrition  Albumin 3.0  There is bitermporal wasting    - suspect hypoalbuminemia and poor nutrition are contributing to third spaced fluid -RD nutritional evaluation    Thank you for allowing me to participate in the care of this patient.   Patient/Family are satisfied with care plan and all questions have been answered.  This document was prepared using Dragon voice recognition software and may include unintentional dictation errors.     Ottie Glazier, M.D.  Division of [facility]

## 2022-01-17 NOTE — Progress Notes (Signed)
Patient presented to [place] for a diagnostic and therapeutic thoracentesis. US imaging was obtained with findings of trace amount of fluid seen on the right and small amount of effusion seen on the left. The left side is unable to be safely accessed percutaneously given overlying obscuring lung border. I contacted the ordering provider to discuss these results and offered a right sided thoracentesis with increased risk given only trace amount of fluid and likely to yield 52m, the decision was made to hold off on any procedure at this time. The findings and plan were discussed today with the patient who agrees with the plan.  MHedy Jacob PA-C 01/17/2022, 2:50 PM

## 2022-01-17 NOTE — Hospital Course (Addendum)
This 71 y.o. 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  He is found to have bilateral pleural effusion.  IR was consulted for thoracocentesis.  On ultrasound fluid was minimal , could not be drained.  Infectious disease, pulmonology and oncology consulted.  Infectious disease recommended QuantiFERON gold test and work-up for vasculitis.  Echocardiogram shows EF 55 to 60%..  Patient is cleared from pulmonology and infectious disease to be discharged.  Patient will follow-up with Dr. Rogue Bussing as scheduled.

## 2022-01-18 DIAGNOSIS — R0600 Dyspnea, unspecified: Secondary | ICD-10-CM

## 2022-01-18 DIAGNOSIS — J9601 Acute respiratory failure with hypoxia: Secondary | ICD-10-CM | POA: Diagnosis not present

## 2022-01-18 DIAGNOSIS — Z720 Tobacco use: Secondary | ICD-10-CM | POA: Diagnosis not present

## 2022-01-18 DIAGNOSIS — R0602 Shortness of breath: Secondary | ICD-10-CM | POA: Diagnosis not present

## 2022-01-18 DIAGNOSIS — C9110 Chronic lymphocytic leukemia of B-cell type not having achieved remission: Secondary | ICD-10-CM | POA: Diagnosis not present

## 2022-01-18 LAB — COMPREHENSIVE METABOLIC PANEL
ALT: 14 U/L (ref 0–44)
AST: 12 U/L — ABNORMAL LOW (ref 15–41)
Albumin: 2.8 g/dL — ABNORMAL LOW (ref 3.5–5.0)
Alkaline Phosphatase: 64 U/L (ref 38–126)
Anion gap: 11 (ref 5–15)
BUN: 15 mg/dL (ref 8–23)
CO2: 24 mmol/L (ref 22–32)
Calcium: 9 mg/dL (ref 8.9–10.3)
Chloride: 102 mmol/L (ref 98–111)
Creatinine, Ser: 0.72 mg/dL (ref 0.61–1.24)
GFR, Estimated: >60 mL/min (ref >60)
Glucose, Bld: 105 mg/dL — ABNORMAL HIGH (ref 70–99)
Potassium: 3.9 mmol/L (ref 3.5–5.1)
Sodium: 137 mmol/L (ref 135–145)
Total Bilirubin: 0.4 mg/dL (ref 0.3–1.2)
Total Protein: 6.3 g/dL — ABNORMAL LOW (ref 6.5–8.1)

## 2022-01-18 LAB — FERRITIN: Ferritin: 450 ng/mL — ABNORMAL HIGH (ref 24–336)

## 2022-01-18 LAB — C DIFFICILE QUICK SCREEN W PCR REFLEX
C Diff antigen: NEGATIVE
C Diff interpretation: NOT DETECTED
C Diff toxin: NEGATIVE

## 2022-01-18 LAB — HIV ANTIBODY (ROUTINE TESTING W REFLEX): HIV Screen 4th Generation wRfx: NONREACTIVE

## 2022-01-18 LAB — CBC
HCT: 39.9 % (ref 39.0–52.0)
Hemoglobin: 13.7 g/dL (ref 13.0–17.0)
MCH: 29.9 pg (ref 26.0–34.0)
MCHC: 34.3 g/dL (ref 30.0–36.0)
MCV: 87.1 fL (ref 80.0–100.0)
Platelets: 554 10*3/uL — ABNORMAL HIGH (ref 150–400)
RBC: 4.58 MIL/uL (ref 4.22–5.81)
RDW: 13.8 % (ref 11.5–15.5)
WBC: 9.5 10*3/uL (ref 4.0–10.5)
nRBC: 0 % (ref 0.0–0.2)

## 2022-01-18 LAB — LACTATE DEHYDROGENASE: LDH: 127 U/L (ref 98–192)

## 2022-01-18 NOTE — Assessment & Plan Note (Addendum)
Patient presents for evaluation of worsening shortness of breath and imaging shows bilateral pleural effusions. last 2D echocardiogram from 11/22 shows an LVEF of 60 to 65%.  Repeat echo shows EF of 55 to 60%.  Grade 2 diastolic dysfunction.  No cardiac tamponade Patient is status post pericardiocentesis for cardiac tamponade in 11/22 Continue Lasix 40 mg daily for now. Patient appears euvolemic on discharge.

## 2022-01-18 NOTE — Assessment & Plan Note (Signed)
Continue apixaban 5 mg p.o. twice daily.   This was diagnosed on a CT in 09/2021  Repeat CT angiogram is negative for acute pulmonary embolism

## 2022-01-18 NOTE — Progress Notes (Signed)
Date of Admission:  01/16/2022     ID: Andres Mullins is a 71 y.o. male  Principal Problem:   Acute hypoxemic respiratory failure ([place]) Active Problems:   Herpes   Tobacco abuse   CLL (chronic lymphocytic leukemia) (HCC)   Pulmonary embolism (HCC)   Acute diastolic CHF (congestive heart failure) (HCC)   History of pericarditis   Pleural effusion due to CHF (congestive heart failure) (HCC)   Fever with chills    Subjective: Breathing better today Off oxygen for the past 2 hours C/o chills but temp normal Room is cold but he does not want the temp to be increased  Medications:   acyclovir  400 mg Oral BID   apixaban  5 mg Oral BID   nicotine  14 mg Transdermal Daily    Objective: Vital signs in last 24 hours: Patient Vitals for the past 24 hrs:  BP Temp Temp src Pulse Resp SpO2 Weight  01/18/22 1136 (!) 112/92 97.9 F (36.6 C) Oral 99 20 100 % --  01/18/22 0735 118/77 97.8 F (36.6 C) Oral 92 20 100 % --  01/18/22 0500 -- -- -- -- -- -- 71.2 kg  01/18/22 0332 112/66 98.3 F (36.8 C) Oral 95 16 98 % --  01/17/22 2259 98/70 98.1 F (36.7 C) -- 98 18 97 % --  01/17/22 2001 108/66 98 F (36.7 C) -- (!) 101 19 99 % --  01/17/22 1600 96/69 97.9 F (36.6 C) Oral (!) 112 17 97 % --     PHYSICAL EXAM:  General: Alert, cooperative, no distress, appears stated age.  Head: Normocephalic, without obvious abnormality, atraumatic. Eyes: Conjunctivae clear, anicteric sclerae. Pupils are equal ENT Nares normal. No drainage or sinus tenderness. Lips, mucosa, and tongue normal. No Thrush Neck: Supple, symmetrical, no adenopathy, thyroid: non tender no carotid bruit and no JVD. Back: No CVA tenderness. Lungs: b/l air entry-  PORT in place Heart: Regular rate and rhythm, no murmur, rub or gallop. Abdomen: Soft, non-tender,not distended. Bowel sounds normal. No masses Extremities: atraumatic, no cyanosis. No edema. No clubbing Skin: No rashes or lesions. Or bruising Lymph:  Cervical, supraclavicular normal. Neurologic: Grossly non-focal  Lab Results Recent Labs    01/17/22 0616 01/18/22 0530  WBC 12.9* 9.5  HGB 13.7 13.7  HCT 39.7 39.9  NA 134* 137  K 4.0 3.9  CL 101 102  CO2 22 24  BUN 11 15  CREATININE 0.74 0.72   Liver Panel Recent Labs    01/16/22 1253 01/17/22 1704 01/18/22 0530  PROT 6.7 6.8 6.3*  ALBUMIN 3.0* 3.1* 2.8*  AST 17 14* 12*  ALT 14 15 14   ALKPHOS 67 71 64  BILITOT 0.8 0.3 0.4  BILIDIR 0.2 <0.1  --   IBILI 0.6 NOT CALCULATED  --    Sedimentation Rate Recent Labs    01/17/22 1704  ESRSEDRATE 55*   C-Reactive Protein Recent Labs    01/17/22 1658  CRP 19.7*    Microbiology:  Studies/Results: CT Angio Chest PE W and/or Wo Contrast  Result Date: 01/16/2022 CLINICAL DATA:  Pulmonary embolism suspected. History of recent pulmonary embolus. History of pericardial effusion. EXAM: CT ANGIOGRAPHY CHEST WITH CONTRAST TECHNIQUE: Multidetector CT imaging of the chest was performed using the standard protocol during bolus administration of intravenous contrast. Multiplanar CT image reconstructions and MIPs were obtained to evaluate the vascular anatomy. RADIATION DOSE REDUCTION: This exam was performed according to the departmental dose-optimization program which includes automated exposure control,  adjustment of the mA and/or kV according to patient size and/or use of iterative reconstruction technique. CONTRAST:  31m OMNIPAQUE IOHEXOL 350 MG/ML SOLN COMPARISON:  CT angio chest on 10/16/2021 FINDINGS: Cardiovascular: Heart size is normal. Trace pericardial effusion similar to prior study. The pulmonary arteries are well opacified. There is no acute pulmonary embolus. There has been resolution of thrombus within the RIGHT LOWER lobe pulmonary artery. Mediastinum/Nodes: The visualized portion of the thyroid gland has a normal appearance. Esophagus is unremarkable. No significant mediastinal, hilar, or axillary adenopathy.  Lungs/Pleura: There small bilateral pleural effusions, LEFT greater than RIGHT. There is associated atelectasis at both lung bases, LEFT greater than RIGHT. No focal consolidations. Minimal paraseptal emphysema at the RIGHT lung apex. Upper Abdomen: Partially imaged LEFT renal cyst is at least 5.7 centimeters. Gallbladder is present. Musculoskeletal: Deformity of the sternum consistent with remote fracture. Moderate degenerative changes in the thoracic spine. There is a remote superior endplate fracture of T8. Review of the MIP images confirms the above findings. IMPRESSION: 1. Technically adequate exam showing no acute pulmonary embolus. 2. Stable small pericardial effusion. 3. New bilateral pleural effusions associated with atelectasis, LEFT greater than RIGHT. 4. Remote sternal fracture. 5. Remote superior endplate fracture of T8. Electronically Signed   By: ENolon NationsM.D.   On: 01/16/2022 13:59   [place] (PLEURAL EFFUSION)  Result Date: 01/17/2022 CLINICAL DATA:  Pleural effusion EXAM: CHEST ULTRASOUND COMPARISON:  None. FINDINGS: Limited sonographic exam of the chest was performed for fluid survey. Images demonstrate tiny right pleural effusion. There is a small left pleural effusion, with overlying atelectatic lung. No safe window identified for image guided thoracentesis. No thoracentesis performed. IMPRESSION: Tiny right and small left pleural effusions, without safe window for percutaneous drainage. No thoracentesis performed. Electronically Signed   By: YAlbin FellingM.D.   On: 01/17/2022 15:23   ECHOCARDIOGRAM COMPLETE  Result Date: 01/17/2022    ECHOCARDIOGRAM REPORT   [facility]   [facility] 01[facility]17/2022 Medical Rec #:  [id number]      Height:       66.0 in Accession #:    [id number]     Weight:       159.2 lb Date of Birth:  [birth year]      BSA:          1.815 m Patient Age:    89years        BP:           107/76 mmHg Patient Gender: M               HR:            100 bpm. Exam Location:  [facility] Procedure: 2D Echo, Color Doppler and Cardiac Doppler STAT ECHO Indications:     I31.3 Pericardial effusion  History:         Patient has prior history of Echocardiogram examinations, most                  recent 10/25/2021. Arrythmias:RBBB; Risk Factors:Dyslipidemia                  and Current Smoker.  Sonographer:     JCharmayne SheerReferring Phys:  1[place]essington SpringsDiagnosing Phys: CNelva BushMD IMPRESSIONS  1. Left ventricular ejection fraction, by estimation, is 55 to 60%. The left ventricle has normal function. Left ventricular endocardial border not optimally defined to evaluate regional wall motion. Left ventricular  diastolic parameters are consistent with Grade II diastolic dysfunction (pseudonormalization).  2. Right ventricular systolic function is normal. The right ventricular size is normal. Mildly increased right ventricular wall thickness.  3. The pericardium appears mildly thickened.. There is no evidence of cardiac tamponade.  4. The mitral valve was not well visualized. Trivial mitral valve regurgitation. No evidence of mitral stenosis.  5. The aortic valve has an indeterminant number of cusps. Aortic valve regurgitation is mild. No aortic stenosis is present.  6. Aortic dilatation noted. There is mild dilatation of the aortic root, measuring 40 mm. There is mild dilatation of the ascending aorta, measuring 36 mm.  7. The inferior vena cava is normal in size with greater than 50% respiratory variability, suggesting right atrial pressure of 3 mmHg. FINDINGS  Left Ventricle: Left ventricular ejection fraction, by estimation, is 55 to 60%. The left ventricle has normal function. Left ventricular endocardial border not optimally defined to evaluate regional wall motion. The left ventricular internal cavity size was normal in size. There is no left ventricular hypertrophy. Left ventricular diastolic parameters are consistent with Grade II diastolic dysfunction  (pseudonormalization). Right Ventricle: The right ventricular size is normal. Mildly increased right ventricular wall thickness. Right ventricular systolic function is normal. Left Atrium: Left atrial size was normal in size. Right Atrium: Right atrial size was normal in size. Pericardium: The pericardium appears mildly thickened. Trivial pericardial effusion is present. There is no evidence of cardiac tamponade. Mitral Valve: The mitral valve was not well visualized. Trivial mitral valve regurgitation. No evidence of mitral valve stenosis. MV peak gradient, 2.0 mmHg. The mean mitral valve gradient is 1.0 mmHg. Tricuspid Valve: The tricuspid valve is not well visualized. Tricuspid valve regurgitation is not demonstrated. Aortic Valve: The aortic valve has an indeterminant number of cusps. Aortic valve regurgitation is mild. No aortic stenosis is present. Aortic valve mean gradient measures 2.0 mmHg. Aortic valve peak gradient measures 4.3 mmHg. Aortic valve area, by VTI measures 4.29 cm. Pulmonic Valve: The pulmonic valve was not well visualized. Pulmonic valve regurgitation is not visualized. No evidence of pulmonic stenosis. Aorta: Aortic dilatation noted. There is mild dilatation of the aortic root, measuring 40 mm. There is mild dilatation of the ascending aorta, measuring 36 mm. Pulmonary Artery: The pulmonary artery is not well seen. Venous: The inferior vena cava is normal in size with greater than 50% respiratory variability, suggesting right atrial pressure of 3 mmHg. IAS/Shunts: The interatrial septum was not well visualized. Additional Comments: There is a small pleural effusion in the left lateral region.  LEFT VENTRICLE PLAX 2D LVIDd:         5.12 cm   Diastology LVIDs:         3.84 cm   LV e' medial:    6.74 cm/s LV PW:         0.96 cm   LV E/e' medial:  8.5 LV IVS:        0.77 cm   LV e' lateral:   10.00 cm/s LVOT diam:     2.00 cm   LV E/e' lateral: 5.7 LV SV:         53 LV SV Index:   29 LVOT Area:      3.14 cm  RIGHT VENTRICLE RV Basal diam:  3.38 cm LEFT ATRIUM             Index        RIGHT ATRIUM  Index LA diam:        3.90 cm 2.15 cm/m   RA Area:     17.60 cm LA Vol (A2C):   23.7 ml 13.06 ml/m  RA Volume:   47.40 ml  26.12 ml/m LA Vol (A4C):   26.1 ml 14.38 ml/m LA Biplane Vol: 26.9 ml 14.82 ml/m  AORTIC VALVE                    PULMONIC VALVE AV Area (Vmax):    3.32 cm     PV Vmax:       1.00 m/s AV Area (Vmean):   3.29 cm     PV Vmean:      67.600 cm/s AV Area (VTI):     4.29 cm     PV VTI:        0.135 m AV Vmax:           104.00 cm/s  PV Peak grad:  4.0 mmHg AV Vmean:          67.800 cm/s  PV Mean grad:  2.0 mmHg AV VTI:            0.123 m AV Peak Grad:      4.3 mmHg AV Mean Grad:      2.0 mmHg LVOT Vmax:         110.00 cm/s LVOT Vmean:        70.900 cm/s LVOT VTI:          0.168 m LVOT/AV VTI ratio: 1.37  AORTA Ao Root diam: 4.00 cm MITRAL VALVE MV Area (PHT): 5.79 cm    SHUNTS MV Area VTI:   2.95 cm    Systemic VTI:  0.17 m MV Peak grad:  2.0 mmHg    Systemic Diam: 2.00 cm MV Mean grad:  1.0 mmHg MV Vmax:       0.70 m/s MV Vmean:      47.9 cm/s MV Decel Time: 131 msec MV E velocity: 57.40 cm/s MV A velocity: 51.00 cm/s MV E/A ratio:  1.13 Christopher End MD Electronically signed by [name] MD Signature Date/Time: 01/17/2022/10:51:23 AM    Final      Assessment/Plan: 71 yr male with h/o CLL on venetoclax and gazyva presented with chest tightness , sob and night sweats  B/l pleural effusion but minimal and could not be tapped  Pericardial effusion in NOV 2022-  DO both have an overarching diagosis? Malignancy VS  Chemo med VS infection VS autoimmune If it is infection then it is more likely a virus- so resp pathogens have been negative Pt also has nhad no fever Near normal procal- so less likely a bacterial infection No evidence of TB lung but quantiferon gold ordered Could it be primary effusion lymphoma castelman's lunlikely as HIV neg Could it be related to  chemo? According to oncology it is not  Autoimmune causes to be ruled out Pt is not on any antibiotics and doing well  Discussed th management with patient and his wife and pulmonologist

## 2022-01-18 NOTE — Assessment & Plan Note (Signed)
Smoking cessation was discussed with patient in detail. Patient states that he has cut back on the number of cigarettes he smokes He declines a nicotine transdermal patch at this time

## 2022-01-18 NOTE — Assessment & Plan Note (Signed)
He reports low-grade fever with chills.  He reports two episodes where he had drenching night sweats since February 11.  ID consulted.  hold off any antibiotics for now. Obtain QuantiFERON gold

## 2022-01-18 NOTE — Progress Notes (Signed)
Progress Note  Patient Name: Andres Mullins Date of Encounter: 01/18/2022  [facility] Cardiologist: [name], MD   Subjective   Still with shortness of breath will do better.  Denies chest pain.  Inpatient Medications    Scheduled Meds:  acyclovir  400 mg Oral BID   apixaban  5 mg Oral BID   nicotine  14 mg Transdermal Daily   Continuous Infusions:  PRN Meds: acetaminophen, ondansetron **OR** ondansetron (ZOFRAN) IV, oxyCODONE   Vital Signs    Vitals:   01/18/22 0332 01/18/22 0500 01/18/22 0735 01/18/22 1136  BP: 112/66  118/77 (!) 112/92  Pulse: 95  92 99  Resp: 16  20 20   Temp: 98.3 F (36.8 C)  97.8 F (36.6 C) 97.9 F (36.6 C)  TempSrc: Oral  Oral Oral  SpO2: 98%  100% 100%  Weight:  71.2 kg    Height:        Intake/Output Summary (Last 24 hours) at 01/18/2022 1253 Last data filed at 01/18/2022 0900 Gross per 24 hour  Intake --  Output 600 ml  Net -600 ml   Last 3 Weights 01/18/2022 01/16/2022 01/16/2022  Weight (lbs) 157 lb 159 lb 2.8 oz 161 lb 1.6 oz  Weight (kg) 71.215 kg 72.2 kg 73.074 kg      Telemetry    Sinus tachycardia, heart rate 109- Personally Reviewed  ECG     - Personally Reviewed  Physical Exam   GEN: No acute distress.   Neck: No JVD Cardiac: RRR, no murmurs, rubs, or gallops.  Respiratory: Diminished breath sounds at bases GI: Soft, nontender, non-distended  MS: No edema; No deformity. Neuro:  Nonfocal  Psych: Normal affect   Labs    High Sensitivity Troponin:   Recent Labs  Lab 01/05/22 1016 01/05/22 1214 01/16/22 1203 01/16/22 1430  TROPONINIHS 5 5 7 6      Chemistry Recent Labs  Lab 01/16/22 1203 01/16/22 1253 01/17/22 0616 01/17/22 1704 01/18/22 0530  NA 134*  --  134*  --  137  K 3.9  --  4.0  --  3.9  CL 98  --  101  --  102  CO2 25  --  22  --  24  GLUCOSE 124*  --  131*  --  105*  BUN 10  --  11  --  15  CREATININE 0.85  --  0.74  --  0.72  CALCIUM 9.3  --  9.0  --  9.0  PROT  --   6.7  --  6.8 6.3*  ALBUMIN  --  3.0*  --  3.1* 2.8*  AST  --  17  --  14* 12*  ALT  --  14  --  15 14  ALKPHOS  --  67  --  71 64  BILITOT  --  0.8  --  0.3 0.4  GFRNONAA >60  --  >60  --  >60  ANIONGAP 11  --  11  --  11    Lipids No results for input(s): CHOL, TRIG, HDL, LABVLDL, LDLCALC, CHOLHDL in the last 168 hours.  Hematology Recent Labs  Lab 01/16/22 1253 01/17/22 0616 01/18/22 0530  WBC 12.4* 12.9* 9.5  RBC 4.73 4.61 4.58  HGB 13.8 13.7 13.7  HCT 41.6 39.7 39.9  MCV 87.9 86.1 87.1  MCH 29.2 29.7 29.9  MCHC 33.2 34.5 34.3  RDW 13.8 14.0 13.8  PLT 582* 557* 554*   Thyroid No results for input(s): TSH,  FREET4 in the last 168 hours.  BNP Recent Labs  Lab 01/16/22 1253  BNP 70.8    DDimer No results for input(s): DDIMER in the last 168 hours.   Radiology    CT Angio Chest PE W and/or Wo Contrast  Result Date: 01/16/2022 CLINICAL DATA:  Pulmonary embolism suspected. History of recent pulmonary embolus. History of pericardial effusion. EXAM: CT ANGIOGRAPHY CHEST WITH CONTRAST TECHNIQUE: Multidetector CT imaging of the chest was performed using the standard protocol during bolus administration of intravenous contrast. Multiplanar CT image reconstructions and MIPs were obtained to evaluate the vascular anatomy. RADIATION DOSE REDUCTION: This exam was performed according to the departmental dose-optimization program which includes automated exposure control, adjustment of the mA and/or kV according to patient size and/or use of iterative reconstruction technique. CONTRAST:  65m OMNIPAQUE IOHEXOL 350 MG/ML SOLN COMPARISON:  CT angio chest on 10/16/2021 FINDINGS: Cardiovascular: Heart size is normal. Trace pericardial effusion similar to prior study. The pulmonary arteries are well opacified. There is no acute pulmonary embolus. There has been resolution of thrombus within the RIGHT LOWER lobe pulmonary artery. Mediastinum/Nodes: The visualized portion of the thyroid gland has a  normal appearance. Esophagus is unremarkable. No significant mediastinal, hilar, or axillary adenopathy. Lungs/Pleura: There small bilateral pleural effusions, LEFT greater than RIGHT. There is associated atelectasis at both lung bases, LEFT greater than RIGHT. No focal consolidations. Minimal paraseptal emphysema at the RIGHT lung apex. Upper Abdomen: Partially imaged LEFT renal cyst is at least 5.7 centimeters. Gallbladder is present. Musculoskeletal: Deformity of the sternum consistent with remote fracture. Moderate degenerative changes in the thoracic spine. There is a remote superior endplate fracture of T8. Review of the MIP images confirms the above findings. IMPRESSION: 1. Technically adequate exam showing no acute pulmonary embolus. 2. Stable small pericardial effusion. 3. New bilateral pleural effusions associated with atelectasis, LEFT greater than RIGHT. 4. Remote sternal fracture. 5. Remote superior endplate fracture of T8. Electronically Signed   By: ENolon NationsM.D.   On: 01/16/2022 13:59   [place] (PLEURAL EFFUSION)  Result Date: 01/17/2022 CLINICAL DATA:  Pleural effusion EXAM: CHEST ULTRASOUND COMPARISON:  None. FINDINGS: Limited sonographic exam of the chest was performed for fluid survey. Images demonstrate tiny right pleural effusion. There is a small left pleural effusion, with overlying atelectatic lung. No safe window identified for image guided thoracentesis. No thoracentesis performed. IMPRESSION: Tiny right and small left pleural effusions, without safe window for percutaneous drainage. No thoracentesis performed. Electronically Signed   By: YAlbin FellingM.D.   On: 01/17/2022 15:23   ECHOCARDIOGRAM COMPLETE  Result Date: 01/17/2022    ECHOCARDIOGRAM REPORT   [facility]20[facility] Medical Rec #:  [id number]      Height:       66.0 in Accession #:    [id number]     Weight:       159.2 lb Date of Birth:  602/21/1952      BSA:          1.815 m  Patient Age:    89years        BP:           107/76 mmHg Patient Gender: M               HR:           100 bpm. Exam Location:  [facility] Procedure: 2D Echo, Color Doppler and Cardiac Doppler STAT ECHO Indications:  I31.3 Pericardial effusion  History:         Patient has prior history of Echocardiogram examinations, most                  recent 10/25/2021. Arrythmias:RBBB; Risk Factors:Dyslipidemia                  and Current Smoker.  Sonographer:     Charmayne Sheer Referring Phys:  [place] Diagnosing Phys: [name] MD IMPRESSIONS  1. Left ventricular ejection fraction, by estimation, is 55 to 60%. The left ventricle has normal function. Left ventricular endocardial border not optimally defined to evaluate regional wall motion. Left ventricular diastolic parameters are consistent with Grade II diastolic dysfunction (pseudonormalization).  2. Right ventricular systolic function is normal. The right ventricular size is normal. Mildly increased right ventricular wall thickness.  3. The pericardium appears mildly thickened.. There is no evidence of cardiac tamponade.  4. The mitral valve was not well visualized. Trivial mitral valve regurgitation. No evidence of mitral stenosis.  5. The aortic valve has an indeterminant number of cusps. Aortic valve regurgitation is mild. No aortic stenosis is present.  6. Aortic dilatation noted. There is mild dilatation of the aortic root, measuring 40 mm. There is mild dilatation of the ascending aorta, measuring 36 mm.  7. The inferior vena cava is normal in size with greater than 50% respiratory variability, suggesting right atrial pressure of 3 mmHg. FINDINGS  Left Ventricle: Left ventricular ejection fraction, by estimation, is 55 to 60%. The left ventricle has normal function. Left ventricular endocardial border not optimally defined to evaluate regional wall motion. The left ventricular internal cavity size was normal in size. There is no left ventricular  hypertrophy. Left ventricular diastolic parameters are consistent with Grade II diastolic dysfunction (pseudonormalization). Right Ventricle: The right ventricular size is normal. Mildly increased right ventricular wall thickness. Right ventricular systolic function is normal. Left Atrium: Left atrial size was normal in size. Right Atrium: Right atrial size was normal in size. Pericardium: The pericardium appears mildly thickened. Trivial pericardial effusion is present. There is no evidence of cardiac tamponade. Mitral Valve: The mitral valve was not well visualized. Trivial mitral valve regurgitation. No evidence of mitral valve stenosis. MV peak gradient, 2.0 mmHg. The mean mitral valve gradient is 1.0 mmHg. Tricuspid Valve: The tricuspid valve is not well visualized. Tricuspid valve regurgitation is not demonstrated. Aortic Valve: The aortic valve has an indeterminant number of cusps. Aortic valve regurgitation is mild. No aortic stenosis is present. Aortic valve mean gradient measures 2.0 mmHg. Aortic valve peak gradient measures 4.3 mmHg. Aortic valve area, by VTI measures 4.29 cm. Pulmonic Valve: The pulmonic valve was not well visualized. Pulmonic valve regurgitation is not visualized. No evidence of pulmonic stenosis. Aorta: Aortic dilatation noted. There is mild dilatation of the aortic root, measuring 40 mm. There is mild dilatation of the ascending aorta, measuring 36 mm. Pulmonary Artery: The pulmonary artery is not well seen. Venous: The inferior vena cava is normal in size with greater than 50% respiratory variability, suggesting right atrial pressure of 3 mmHg. IAS/Shunts: The interatrial septum was not well visualized. Additional Comments: There is a small pleural effusion in the left lateral region.  LEFT VENTRICLE PLAX 2D LVIDd:         5.12 cm   Diastology LVIDs:         3.84 cm   LV e' medial:    6.74 cm/s LV PW:  0.96 cm   LV E/e' medial:  8.5 LV IVS:        0.77 cm   LV e' lateral:    10.00 cm/s LVOT diam:     2.00 cm   LV E/e' lateral: 5.7 LV SV:         53 LV SV Index:   29 LVOT Area:     3.14 cm  RIGHT VENTRICLE RV Basal diam:  3.38 cm LEFT ATRIUM             Index        RIGHT ATRIUM           Index LA diam:        3.90 cm 2.15 cm/m   RA Area:     17.60 cm LA Vol (A2C):   23.7 ml 13.06 ml/m  RA Volume:   47.40 ml  26.12 ml/m LA Vol (A4C):   26.1 ml 14.38 ml/m LA Biplane Vol: 26.9 ml 14.82 ml/m  AORTIC VALVE                    PULMONIC VALVE AV Area (Vmax):    3.32 cm     PV Vmax:       1.00 m/s AV Area (Vmean):   3.29 cm     PV Vmean:      67.600 cm/s AV Area (VTI):     4.29 cm     PV VTI:        0.135 m AV Vmax:           104.00 cm/s  PV Peak grad:  4.0 mmHg AV Vmean:          67.800 cm/s  PV Mean grad:  2.0 mmHg AV VTI:            0.123 m AV Peak Grad:      4.3 mmHg AV Mean Grad:      2.0 mmHg LVOT Vmax:         110.00 cm/s LVOT Vmean:        70.900 cm/s LVOT VTI:          0.168 m LVOT/AV VTI ratio: 1.37  AORTA Ao Root diam: 4.00 cm MITRAL VALVE MV Area (PHT): 5.79 cm    SHUNTS MV Area VTI:   2.95 cm    Systemic VTI:  0.17 m MV Peak grad:  2.0 mmHg    Systemic Diam: 2.00 cm MV Mean grad:  1.0 mmHg MV Vmax:       0.70 m/s MV Vmean:      47.9 cm/s MV Decel Time: 131 msec MV E velocity: 57.40 cm/s MV A velocity: 51.00 cm/s MV E/A ratio:  1.13 [name] MD Electronically signed by [name] MD Signature Date/Time: 01/17/2022/10:51:23 AM    Final     Cardiac Studies   TTE 01/17/2022 1. Left ventricular ejection fraction, by estimation, is 55 to 60%. The  left ventricle has normal function. Left ventricular endocardial border  not optimally defined to evaluate regional wall motion.   2. Right ventricular systolic function is normal. The right ventricular  size is normal. Mildly increased right ventricular wall thickness.   3. The pericardium appears mildly thickened.. There is no evidence of  cardiac tamponade.   4. The mitral valve was not well visualized.  Trivial mitral valve  regurgitation. No evidence of mitral stenosis.   5. The aortic valve has an indeterminant number of cusps. Aortic valve  regurgitation is mild. No aortic stenosis is present.   6. Aortic dilatation noted. There is mild dilatation of the aortic root,  measuring 40 mm. There is mild dilatation of the ascending aorta,  measuring 36 mm.   7. The inferior vena cava is normal in size with greater than 50%  respiratory variability, suggesting right atrial pressure of 3 mmHg.   Patient Profile     71 y.o. male with history of CLL, pericardial effusion s/p pericardiocentesis, current smoker presenting with worsening shortness of breath  Assessment & Plan    Shortness of breath -No current cardiac etiology to explain symptoms -Echocardiogram reviewed by myself showing normal systolic function, diastolic function appears normal. -No evidence for pericardial effusion recurrence. -No Indication for additional cardiac testing at this time -Pulmonary etiology including smoking, chest CT showed some emphysema, also chronic malignancy possibly contributing  2.  History of pericardial effusion s/p pericardiocentesis -Repeat echo yesterday with no recurrence  3.  Current smoker, emphysema, hypoxia -Likely cause of shortness of breath -Smoking cessation -Management as per medicine team.  4.  CLL, thrombocytosis -Management as per oncology  No additional cardiac testing planned.  Continue management of chronic illness as per primary team.  Please let us know if additional input is needed.  Total encounter time more than 50 minutes  Greater than 50% was spent in counseling and coordination of care with the patient     Signed, Kate Sable, MD  01/18/2022, 12:53 PM

## 2022-01-18 NOTE — Assessment & Plan Note (Addendum)
Seems multifactorial  Bilateral pleural effusion, Pericardial effusion, CHF, CLL and PE.  He is on 2 L oxygen via nasal cannula.   He is successfully weaned down to room air.

## 2022-01-18 NOTE — Progress Notes (Signed)
Nutrition Brief Note  Patient identified on the Malnutrition Screening Tool (MST) Report  Wt Readings from Last 15 Encounters:  01/18/22 71.2 kg  01/16/22 73.1 kg  01/05/22 76.6 kg  12/27/21 76.6 kg  12/19/21 76.6 kg  12/12/21 77.6 kg  12/09/21 77.1 kg  11/25/21 76.7 kg  11/01/21 73.5 kg  10/27/21 72.2 kg  10/17/21 73.3 kg  10/10/21 70.1 kg  10/05/21 70.9 kg  10/02/21 72.6 kg  09/15/21 73.5 kg   This 71 y.o. 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  Pt admitted with respiratory failure and pleural effusion.   Spoke with pt, who was pleasant and in good spirits today. He reports that he feels about the same since admission. Pt shares that he has a nontraditional eating pattern- he typically consumes one large meal (meat, starch, and vegetable) in the evening, will occasionally eat lunch (burger when out with friends), and consumes 2 cups of coffee, a gallon of water, and gatorade daily.   Per pt, his UBW is 160#. He admits that he gained weight back in November when on steroids and has slowly lost back to his UBW. Pt suspects that hospital weight loss is related to diuresis and dehydration.   Pt shares that he noticed muscle atrophy on his lower extremities, which concerned him. Pt is suspicious that side effects from his chemo medications have contributed to his symptoms.   Nutrition-Focused physical exam completed. Findings are no fat depletion, mild muscle depletion, and no edema.    Discussed findings with pt and MD. Albumin has a half-life of 21 days and is strongly affected by stress response and inflammatory process, therefore, do not expect to see an improvement in this lab value during acute hospitalization. When a patient presents with low albumin, it is likely skewed due to the acute  inflammatory response.  Unless it is suspected that patient had poor PO intake or malnutrition prior to admission, then RD should not be consulted solely for low albumin. Note that low albumin is no longer used to diagnose malnutrition;  uses the new malnutrition guidelines published by the American Society for Parenteral and Enteral Nutrition (A.S.P.E.N.) and the Academy of Nutrition and Dietetics (AND).   Pt understands that he does not meet criteria for malnutrition, however, knows he is at risk secondary to medical complexity.   Labs reviewed.   Current diet order is regular, patient is consuming approximately 100% of meals at this time. Labs and medications reviewed.   No nutrition interventions warranted at this time. If nutrition issues arise, please consult RD.   Loistine Chance, RD, LDN, [place] Registered Dietitian II Certified Diabetes Care and Education Specialist Please refer to [facility] for RD and/or RD on-call/weekend/after hours pager

## 2022-01-18 NOTE — Progress Notes (Signed)
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  He has rib and sternal fracture history.   Patient was seen in hospital early this month for possible LRTI and had treatment but came back due to progressive severe symptoms of dyspnea.   He admits to chronic diarreah.  He had thoracentesis ordered with no safe window for aspiration.   This is likely due to diuresis of >1L of fluid.   Imaging independently reviewed by me with moderate left and mild right pleural effusion with compressive atelectasis bilaterally otherwise no severe abnormalities.  01/18/22- patient is improved, HR is not nearly as tachycardic as yesterday.  He is only on 2L/min. He is feeling better. We can work on weaning off O2 today.   PAST MEDICAL HISTORY   Past Medical History:  Diagnosis Date   Atherosclerosis of coronary artery 09/28/2016   Chest CT Nov 2017   Atrial tachycardia (HCC)    CLL (chronic lymphocytic leukemia) (HCC)    Elevated C-reactive protein (CRP)    Elevated PSA    Fractured sternum 1999   Herpes    History of rib fracture 1999   multiple   Hyperlipidemia    Psoriasis    Pulmonary embolism ([place])    Rheumatoid factor positive 02/2014   Rosacea    Thoracic aortic atherosclerosis ([place]) 09/28/2016   Chest CT Nov 2017   Tobacco abuse    Vitamin D deficiency disease      SURGICAL HISTORY   Past Surgical History:  Procedure Laterality Date   APPENDECTOMY  1974   PERICARDIOCENTESIS N/A 10/03/2021   Procedure: PERICARDIOCENTESIS;  Surgeon: Andres Booze, MD;  Location: [place] [facility];   Service: Cardiovascular;  Laterality: N/A;   PORTA CATH INSERTION N/A 06/06/2021   Procedure: PORTA CATH INSERTION;  Surgeon: Andres Huxley, MD;  Location: [place] [facility];  Service: Cardiovascular;  Laterality: N/A;   SHOULDER ARTHROSCOPY  10/23/2016   Procedure: ARTHROSCOPY SHOULDER WITH DISTAL CLAVICLE EXCISION, PARTIAL ACROMIONECTOMY, AND DEBRIDEMENT;  Surgeon: Andres Leys, MD;  Location: [facility];  Service: Orthopedics;;   TIBIA FRACTURE SURGERY Left 2004   titanium rod, ORIF     FAMILY HISTORY   Family History  Problem Relation Age of Onset   Heart disease Father    Osteoarthritis Father    Alzheimer's disease Father    Cancer Neg Hx    Diabetes Neg Hx    Hypertension Neg Hx    Stroke Neg Hx    COPD Neg Hx      SOCIAL HISTORY   Social History   Tobacco Use   Smoking status: Every Day    Packs/day: 0.50    Years: 45.00    Pack years: 22.50    Types: Cigarettes   Smokeless tobacco: Never   Tobacco comments:    11/6- currently smoking 5 cigarettes a day   Vaping Use   Vaping Use: Never used  Substance Use Topics   Alcohol  use: Yes    Alcohol/week: 3.0 standard drinks    Types: 3 Glasses of wine per week    Comment: a couple glasses of wine a week   Drug use: No     MEDICATIONS    Home Medication:    Current Medication:  Current Facility-Administered Medications:    acetaminophen (TYLENOL) tablet 650 mg, 650 mg, Oral, Q6H PRN, Agbata, Tochukwu, MD   acyclovir (ZOVIRAX) 200 MG capsule 400 mg, 400 mg, Oral, BID, Noralee Space, RPH, 400 mg at 01/18/22 1001   apixaban (ELIQUIS) tablet 5 mg, 5 mg, Oral, BID, Agbata, Tochukwu, MD, 5 mg at 01/18/22 1001   nicotine (NICODERM CQ - dosed in mg/24 hours) patch 14 mg, 14 mg, Transdermal, Daily, Andres Mullins, Andres Demattia, MD, 14 mg at 01/18/22 0000   ondansetron (ZOFRAN) tablet 4 mg, 4 mg, Oral, Q6H PRN **OR** ondansetron (ZOFRAN) injection 4 mg, 4 mg, Intravenous, Q6H PRN, Agbata, Tochukwu, MD   oxyCODONE (Oxy  IR/ROXICODONE) immediate release tablet 5 mg, 5 mg, Oral, Q6H PRN, Agbata, Tochukwu, MD    ALLERGIES   Gluten meal     REVIEW OF SYSTEMS    Review of Systems:  Gen:  Denies  fever, sweats, chills weigh loss  HEENT: Denies blurred vision, double vision, ear pain, eye pain, hearing loss, nose bleeds, sore throat Cardiac:  No dizziness, chest pain or heaviness, chest tightness,edema Resp:   Denies cough or sputum porduction, shortness of breath,wheezing, hemoptysis,  Gi: Denies swallowing difficulty, stomach pain, nausea or vomiting, diarrhea, constipation, bowel incontinence Gu:  Denies bladder incontinence, burning urine Ext:   Denies Joint pain, stiffness or swelling Skin: Denies  skin rash, easy bruising or bleeding or hives Endoc:  Denies polyuria, polydipsia , polyphagia or weight change Psych:   Denies depression, insomnia or hallucinations   Other:  All other systems negative   VS: BP (!) 112/92 (BP Location: Left Arm)    Pulse 99    Temp 97.9 F (36.6 C) (Oral)    Resp 20    Ht 5' 6"  (1.676 m)    Wt 71.2 kg    SpO2 100%    BMI 25.34 kg/m      PHYSICAL EXAM    GENERAL:NAD, no fevers, chills, no weakness no fatigue HEAD: Normocephalic, atraumatic.  EYES: Pupils equal, round, reactive to light. Extraocular muscles intact. No scleral icterus.  MOUTH: Moist mucosal membrane. Dentition intact. No abscess noted.  EAR, NOSE, THROAT: Clear without exudates. No external lesions.  NECK: Supple. No thyromegaly. No nodules. No JVD.  PULMONARY: Diffuse coarse rhonchi right sided +wheezes CARDIOVASCULAR: S1 and S2. Regular rate and rhythm. No murmurs, rubs, or gallops. No edema. Pedal pulses 2+ bilaterally.  GASTROINTESTINAL: Soft, nontender, nondistended. No masses. Positive bowel sounds. No hepatosplenomegaly.  MUSCULOSKELETAL: No swelling, clubbing, or edema. Range of motion full in all extremities.  NEUROLOGIC: Cranial nerves II through XII are intact. No gross focal  neurological deficits. Sensation intact. Reflexes intact.  SKIN: No ulceration, lesions, rashes, or cyanosis. Skin warm and dry. Turgor intact.  PSYCHIATRIC: Mood, affect within normal limits. The patient is awake, alert and oriented x 3. Insight, judgment intact.       IMAGING    DG Chest 2 View  Result Date: 01/16/2022 CLINICAL DATA:  Shortness of breath. EXAM: CHEST - 2 VIEW COMPARISON:  Chest radiographs and CT 01/05/2022 FINDINGS: A right jugular Port-A-Cath terminates near the superior cavoatrial junction. The cardiomediastinal silhouette is unchanged with normal heart size. There are new  small bilateral pleural effusions, and there are new mild left basilar opacities which are partially curvilinear in configuration. No edema or pneumothorax is evident. No acute osseous abnormality is seen. IMPRESSION: New small bilateral pleural effusions with mild left basilar opacities likely reflecting atelectasis although infection is not excluded. Electronically Signed   By: Logan Bores M.D.   On: 01/16/2022 12:23   DG Chest 2 View  Result Date: 01/05/2022 CLINICAL DATA:  Chest pain EXAM: CHEST - 2 VIEW COMPARISON:  Chest x-ray dated October 24, 2021 FINDINGS: Right chest wall port with tip in the superior cavoatrial junction. Cardiac and mediastinal contours within normal limits. Mild bibasilar atelectasis. Lungs otherwise clear. No pleural effusion or pneumothorax. IMPRESSION: No active cardiopulmonary disease. Electronically Signed   By: Yetta Glassman M.D.   On: 01/05/2022 10:59   CT Chest Wo Contrast  Result Date: 01/05/2022 CLINICAL DATA:  Right-sided chest pain and tachycardia. Denies shortness of breath. History of leukemia. EXAM: CT CHEST WITHOUT CONTRAST TECHNIQUE: Multidetector CT imaging of the chest was performed following the standard protocol without IV contrast. RADIATION DOSE REDUCTION: This exam was performed according to the departmental dose-optimization program which includes  automated exposure control, adjustment of the mA and/or kV according to patient size and/or use of iterative reconstruction technique. COMPARISON:  CTA chest dated October 16, 2021. FINDINGS: Cardiovascular: Normal heart size. Unchanged small pericardial effusion. No thoracic aortic aneurysm. Unchanged right chest wall port catheter with tip at the cavoatrial junction. Mediastinum/Nodes: Decreasing bilateral axillary lymphadenopathy. Index nodule in the left axilla currently measures 1.1 cm in short axis (series 2, image 29), previously 1.5 cm. No enlarged mediastinal lymph nodes. The thyroid gland, trachea, and esophagus demonstrate no significant findings. Lungs/Pleura: Unchanged mild paraseptal and centrilobular emphysema. Resolved left pleural effusion with improved aeration at the left lower lobe. No consolidation or pneumothorax. Upper Abdomen: No acute abnormality. Musculoskeletal: No acute or significant osseous findings. Old sternal body fracture again noted. IMPRESSION: 1.  No acute intrathoracic process. 2. Decreasing bilateral axillary lymphadenopathy. 3. Resolved left pleural effusion. Unchanged small pericardial effusion. 4. Emphysema (ICD10-J43.9). Electronically Signed   By: [name] Dubin M.D.   On: 01/05/2022 12:32   CT Angio Chest PE W and/or Wo Contrast  Result Date: 01/16/2022 CLINICAL DATA:  Pulmonary embolism suspected. History of recent pulmonary embolus. History of pericardial effusion. EXAM: CT ANGIOGRAPHY CHEST WITH CONTRAST TECHNIQUE: Multidetector CT imaging of the chest was performed using the standard protocol during bolus administration of intravenous contrast. Multiplanar CT image reconstructions and MIPs were obtained to evaluate the vascular anatomy. RADIATION DOSE REDUCTION: This exam was performed according to the departmental dose-optimization program which includes automated exposure control, adjustment of the mA and/or kV according to patient size and/or use of iterative  reconstruction technique. CONTRAST:  41m OMNIPAQUE IOHEXOL 350 MG/ML SOLN COMPARISON:  CT angio chest on 10/16/2021 FINDINGS: Cardiovascular: Heart size is normal. Trace pericardial effusion similar to prior study. The pulmonary arteries are well opacified. There is no acute pulmonary embolus. There has been resolution of thrombus within the RIGHT LOWER lobe pulmonary artery. Mediastinum/Nodes: The visualized portion of the thyroid gland has a normal appearance. Esophagus is unremarkable. No significant mediastinal, hilar, or axillary adenopathy. Lungs/Pleura: There small bilateral pleural effusions, LEFT greater than RIGHT. There is associated atelectasis at both lung bases, LEFT greater than RIGHT. No focal consolidations. Minimal paraseptal emphysema at the RIGHT lung apex. Upper Abdomen: Partially imaged LEFT renal cyst is at least 5.7 centimeters. Gallbladder is present. Musculoskeletal: Deformity  of the sternum consistent with remote fracture. Moderate degenerative changes in the thoracic spine. There is a remote superior endplate fracture of T8. Review of the MIP images confirms the above findings. IMPRESSION: 1. Technically adequate exam showing no acute pulmonary embolus. 2. Stable small pericardial effusion. 3. New bilateral pleural effusions associated with atelectasis, LEFT greater than RIGHT. 4. Remote sternal fracture. 5. Remote superior endplate fracture of T8. Electronically Signed   By: Nolon Nations M.D.   On: 01/16/2022 13:59   [place] CHEST (PLEURAL EFFUSION)  Result Date: 01/17/2022 CLINICAL DATA:  Pleural effusion EXAM: CHEST ULTRASOUND COMPARISON:  None. FINDINGS: Limited sonographic exam of the chest was performed for fluid survey. Images demonstrate tiny right pleural effusion. There is a small left pleural effusion, with overlying atelectatic lung. No safe window identified for image guided thoracentesis. No thoracentesis performed. IMPRESSION: Tiny right and small left pleural  effusions, without safe window for percutaneous drainage. No thoracentesis performed. Electronically Signed   By: Albin Felling M.D.   On: 01/17/2022 15:23   ECHOCARDIOGRAM COMPLETE  Result Date: 01/17/2022    ECHOCARDIOGRAM REPORT   Patient Name:   Andres Mullins [facility] Date of Exam: 01/17/2022 Medical Rec #:  [id number]       Height:       66.0 in Accession #:    [id number]      Weight:       159.2 lb Date of Birth:  09-Oct-1951       BSA:          1.815 m Patient Age:    61 years        BP:           107/76 mmHg Patient Gender: M               HR:           100 bpm. Exam Location:  [facility] Procedure: 2D Echo, Color Doppler and Cardiac Doppler STAT ECHO Indications:     I31.3 Pericardial effusion  History:         Patient has prior history of Echocardiogram examinations, most                  recent 10/25/2021. Arrythmias:RBBB; Risk Factors:Dyslipidemia                  and Current Smoker.  Sonographer:     Charmayne Sheer Referring Phys:  [place] Diagnosing Phys: [name] MD IMPRESSIONS  1. Left ventricular ejection fraction, by estimation, is 55 to 60%. The left ventricle has normal function. Left ventricular endocardial border not optimally defined to evaluate regional wall motion. Left ventricular diastolic parameters are consistent with Grade II diastolic dysfunction (pseudonormalization).  2. Right ventricular systolic function is normal. The right ventricular size is normal. Mildly increased right ventricular wall thickness.  3. The pericardium appears mildly thickened.. There is no evidence of cardiac tamponade.  4. The mitral valve was not well visualized. Trivial mitral valve regurgitation. No evidence of mitral stenosis.  5. The aortic valve has an indeterminant number of cusps. Aortic valve regurgitation is mild. No aortic stenosis is present.  6. Aortic dilatation noted. There is mild dilatation of the aortic root, measuring 40 mm. There is mild dilatation of the ascending aorta, measuring 36  mm.  7. The inferior vena cava is normal in size with greater than 50% respiratory variability, suggesting right atrial pressure of 3 mmHg. FINDINGS  Left Ventricle: Left ventricular ejection fraction,  by estimation, is 55 to 60%. The left ventricle has normal function. Left ventricular endocardial border not optimally defined to evaluate regional wall motion. The left ventricular internal cavity size was normal in size. There is no left ventricular hypertrophy. Left ventricular diastolic parameters are consistent with Grade II diastolic dysfunction (pseudonormalization). Right Ventricle: The right ventricular size is normal. Mildly increased right ventricular wall thickness. Right ventricular systolic function is normal. Left Atrium: Left atrial size was normal in size. Right Atrium: Right atrial size was normal in size. Pericardium: The pericardium appears mildly thickened. Trivial pericardial effusion is present. There is no evidence of cardiac tamponade. Mitral Valve: The mitral valve was not well visualized. Trivial mitral valve regurgitation. No evidence of mitral valve stenosis. MV peak gradient, 2.0 mmHg. The mean mitral valve gradient is 1.0 mmHg. Tricuspid Valve: The tricuspid valve is not well visualized. Tricuspid valve regurgitation is not demonstrated. Aortic Valve: The aortic valve has an indeterminant number of cusps. Aortic valve regurgitation is mild. No aortic stenosis is present. Aortic valve mean gradient measures 2.0 mmHg. Aortic valve peak gradient measures 4.3 mmHg. Aortic valve area, by VTI measures 4.29 cm. Pulmonic Valve: The pulmonic valve was not well visualized. Pulmonic valve regurgitation is not visualized. No evidence of pulmonic stenosis. Aorta: Aortic dilatation noted. There is mild dilatation of the aortic root, measuring 40 mm. There is mild dilatation of the ascending aorta, measuring 36 mm. Pulmonary Artery: The pulmonary artery is not well seen. Venous: The inferior vena cava  is normal in size with greater than 50% respiratory variability, suggesting right atrial pressure of 3 mmHg. IAS/Shunts: The interatrial septum was not well visualized. Additional Comments: There is a small pleural effusion in the left lateral region.  LEFT VENTRICLE PLAX 2D LVIDd:         5.12 cm   Diastology LVIDs:         3.84 cm   LV e' medial:    6.74 cm/s LV PW:         0.96 cm   LV E/e' medial:  8.5 LV IVS:        0.77 cm   LV e' lateral:   10.00 cm/s LVOT diam:     2.00 cm   LV E/e' lateral: 5.7 LV SV:         53 LV SV Index:   29 LVOT Area:     3.14 cm  RIGHT VENTRICLE RV Basal diam:  3.38 cm LEFT ATRIUM             Index        RIGHT ATRIUM           Index LA diam:        3.90 cm 2.15 cm/m   RA Area:     17.60 cm LA Vol (A2C):   23.7 ml 13.06 ml/m  RA Volume:   47.40 ml  26.12 ml/m LA Vol (A4C):   26.1 ml 14.38 ml/m LA Biplane Vol: 26.9 ml 14.82 ml/m  AORTIC VALVE                    PULMONIC VALVE AV Area (Vmax):    3.32 cm     PV Vmax:       1.00 m/s AV Area (Vmean):   3.29 cm     PV Vmean:      67.600 cm/s AV Area (VTI):     4.29 cm     PV VTI:  0.135 m AV Vmax:           104.00 cm/s  PV Peak grad:  4.0 mmHg AV Vmean:          67.800 cm/s  PV Mean grad:  2.0 mmHg AV VTI:            0.123 m AV Peak Grad:      4.3 mmHg AV Mean Grad:      2.0 mmHg LVOT Vmax:         110.00 cm/s LVOT Vmean:        70.900 cm/s LVOT VTI:          0.168 m LVOT/AV VTI ratio: 1.37  AORTA Ao Root diam: 4.00 cm MITRAL VALVE MV Area (PHT): 5.79 cm    SHUNTS MV Area VTI:   2.95 cm    Systemic VTI:  0.17 m MV Peak grad:  2.0 mmHg    Systemic Diam: 2.00 cm MV Mean grad:  1.0 mmHg MV Vmax:       0.70 m/s MV Vmean:      47.9 cm/s MV Decel Time: 131 msec MV E velocity: 57.40 cm/s MV A velocity: 51.00 cm/s MV E/A ratio:  1.13 Harrell Gave End MD Electronically signed by [name] MD Signature Date/Time: 01/17/2022/10:51:23 AM    Final       ASSESSMENT/PLAN   Acute hypoxemic respiratory failure -Due to bilateral  pleural effusions and associated compressive atelectasis. -he has albuminemia and diastolic heart failure stage 2. -he also has atelectasis and would benefit from incentive spirometry  -mild leukocytosis with negative procalcitonin and normal lactate.   -RVP ordered   Tachyarrythmia -his pulse was with skipped beats during physical examinatioI suspect PVCs but will obtain 12 lead EKG -cardiology on case appreciate input   Tobacco smoking  - patient actively smoking  - counseling provided for smoking cessation   Mild protein calorie malnutrition  Albumin 3.0  There is bitermporal wasting   - suspect hypoalbuminemia and poor nutrition are contributing to third spaced fluid -RD nutritional evaluation      Thank you for allowing me to participate in the care of this patient.   Patient/Family are satisfied with care plan and all questions have been answered.  This document was prepared using Dragon voice recognition software and may include unintentional dictation errors.     Ottie Glazier, M.D.  Division of [facility]

## 2022-01-18 NOTE — Assessment & Plan Note (Signed)
Continue acyclovir

## 2022-01-18 NOTE — Assessment & Plan Note (Signed)
CT scan shows bilateral pleural effusion.  Left greater than right.  Pulmonology consulted.  Ultrasound-guided thoracentesis ordered.  IR has evaluated the patient. Thoracocentesis not attempted due to the trace amount of effusion.

## 2022-01-18 NOTE — Assessment & Plan Note (Signed)
Stable. Hold Venetoclax Discussed with Dr. Rogue Bussing.

## 2022-01-18 NOTE — Progress Notes (Signed)
Progress Note   Patient: Andres Mullins QHU:[id number] DOB: 1951-05-19 DOA: 01/16/2022     2 DOS: the patient was seen and examined on 01/18/2022   Brief hospital course: This 71 y.o. 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  2/21:  He underwent Left sided thoracentesis, Cardio, pulmonary, ID, oncology consulted. 2/22: Echo: LVEF 55 to 60%.  Patient reports feeling better.  Assessment and Plan: * Acute hypoxemic respiratory failure ([place])- (present on admission) Seems multifactorial  Bilateral pleural effusion, Pericardial effusion, CHF, CLL and PE.  He is on 2 L oxygen via nasal cannula.   Wean as tolerated.  Fever with chills- (present on admission) He reports low-grade fever with chills.  He reports two episodes where he had drenching night sweats since February 11.  ID consulted.  hold off any antibiotics for now. Obtain QuantiFERON gold  Pleural effusion due to CHF (congestive heart failure) (HCC) CT scan shows bilateral pleural effusion.  Left greater than right.  Pulmonology consulted.  Ultrasound-guided thoracentesis ordered.  IR has evaluated the patient. Thoracocentesis not attempted due to the trace amount of effusion.  History of pericarditis Continue colchicine. Repeat 2D echo shows no recurrence of pericarditis  Acute diastolic CHF (congestive heart failure) ([place])- (present on admission) Patient presents for evaluation of worsening shortness of breath and imaging shows bilateral pleural effusions. last 2D echocardiogram from 11/22 shows an LVEF of 60 to 65%.  Repeat echo shows EF of 55 to 60%.  Grade 2 diastolic dysfunction.  No cardiac tamponade Patient is status post pericardiocentesis for cardiac tamponade in 11/22 Continue Lasix 40 mg IV daily for now  Pulmonary embolism  ([place])- (present on admission) Continue apixaban 5 mg p.o. twice daily.   This was diagnosed on a CT in 09/2021  Repeat CT angiogram is negative for acute pulmonary embolism  CLL (chronic lymphocytic leukemia) ([place])- (present on admission) Stable. Hold Venetoclax Discussed with Dr. Rogue Bussing.  Tobacco abuse- (present on admission) Smoking cessation was discussed with patient in detail. Patient states that he has cut back on the number of cigarettes he smokes He declines a nicotine transdermal patch at this time  Herpes- (present on admission) Continue acyclovir   Subjective: Patient was seen and examined at bedside.  Overnight events noted. Patient was ambulating in the room on 2 L of supplemental oxygen.  Denies any chest pain.  Physical Exam: Vitals:   01/18/22 0332 01/18/22 0500 01/18/22 0735 01/18/22 1136  BP: 112/66  118/77 (!) 112/92  Pulse: 95  92 99  Resp: 16  20 20   Temp: 98.3 F (36.8 C)  97.8 F (36.6 C) 97.9 F (36.6 C)  TempSrc: Oral  Oral Oral  SpO2: 98%  100% 100%  Weight:  71.2 kg    Height:       General exam: Appears comfortable, not in any acute distress. Respiratory system: CTA bilaterally, no wheezing, no crackles. Cardiovascular system: S1-S2 heard, regular rate and rhythm, no murmur Gastrointestinal system: Abdomen is soft, nontender, nondistended, BS+ Central nervous system: Alert, oriented x3, no neurological deficits. Extremities: No edema, no cyanosis, no clubbing Psychiatry: Mood, insight, judgment normal   Data Reviewed: I have Reviewed nursing notes, Vitals, and Lab results since pt's last encounter. Pertinent lab results CBC, BMP I have ordered test including CBC, BMP I have reviewed the last note from pulmonology, cardiology, oncology,  I have  discussed pt's care plan and test results with with patient.   Family Communication: No family at bedside Disposition: Status is: Inpatient Remains inpatient appropriate because:  Admitted  for acute hypoxia secondary to bilateral pleural effusion.  Patient also has low-grade fever with night sweats and weight loss infectious disease suggested rule out TB.   Planned Discharge Destination: Home   Time spent: 45 minutes  Author: Shawna Clamp, MD 01/18/2022 1:10 PM  For on call review [email or web address].

## 2022-01-18 NOTE — Assessment & Plan Note (Addendum)
Colchicine discontinued. Repeat 2D echo shows no recurrence of pericarditis

## 2022-01-19 ENCOUNTER — Other Ambulatory Visit (HOSPITAL_COMMUNITY): Payer: Self-pay

## 2022-01-19 DIAGNOSIS — J9601 Acute respiratory failure with hypoxia: Secondary | ICD-10-CM | POA: Diagnosis not present

## 2022-01-19 LAB — ANA COMPREHENSIVE PANEL

## 2022-01-19 LAB — ANCA TITERS
Atypical P-ANCA titer: <1:20 {titer}
C-ANCA: <1:20 {titer}
P-ANCA: <1:20 {titer}

## 2022-01-19 LAB — RHEUMATOID FACTOR: Rheumatoid fact SerPl-aCnc: 27.6 IU/mL — ABNORMAL HIGH (ref <14.0)

## 2022-01-19 LAB — ANGIOTENSIN CONVERTING ENZYME: Angiotensin-Converting Enzyme: 28 U/L (ref 14–82)

## 2022-01-19 NOTE — Progress Notes (Signed)
Discharge instructions explained to pt/ verbalized an understanding / iv and tele removed/ transported off unit via wheelchair.  

## 2022-01-19 NOTE — Discharge Instructions (Signed)
Advised to follow-up with PCP in 1 week. Advised to follow-up with oncology Dr. Rogue Bussing as scheduled. Recheck CBC BMP in 1 week.

## 2022-01-19 NOTE — Progress Notes (Signed)
ELVA BREAKER   DOB:May 17, 1951   OT#:[id number]    Subjective: Patient resting in the chair.  Ambulating independently.  No acute distress.  He is on room air.  Cough improved.  Eager to go home.  Objective:  Vitals:   01/19/22 0012 01/19/22 0430  BP: 119/74 109/73  Pulse: (!) 104 91  Resp: 16 18  Temp: 98.7 F (37.1 C) 97.7 F (36.5 C)  SpO2: 98% 97%     Intake/Output Summary (Last 24 hours) at 01/19/2022 2049 Last data filed at 01/19/2022 0700 Gross per 24 hour  Intake 440 ml  Output --  Net 440 ml    Physical Exam Vitals and nursing note reviewed.  HENT:     Head: Normocephalic and atraumatic.     Mouth/Throat:     Pharynx: Oropharynx is clear.  Eyes:     Extraocular Movements: Extraocular movements intact.     Pupils: Pupils are equal, round, and reactive to light.  Cardiovascular:     Rate and Rhythm: Normal rate and regular rhythm.  Pulmonary:     Comments: Decreased breath sounds bilaterally.  Abdominal:     Palpations: Abdomen is soft.  Musculoskeletal:        General: Normal range of motion.     Cervical back: Normal range of motion.  Skin:    General: Skin is warm.  Neurological:     General: No focal deficit present.     Mental Status: He is alert and oriented to person, place, and time.  Psychiatric:        Behavior: Behavior normal.        Judgment: Judgment normal.     Labs:  Lab Results  Component Value Date   WBC 9.5 01/18/2022   HGB 13.7 01/18/2022   HCT 39.9 01/18/2022   MCV 87.1 01/18/2022   PLT 554 (H) 01/18/2022   NEUTROABS 9.6 (H) 01/16/2022    Lab Results  Component Value Date   NA 137 01/18/2022   K 3.9 01/18/2022   CL 102 01/18/2022   CO2 24 01/18/2022    Studies:  No results found.  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   # CLL-currently on Gazyva plus venetoclax.  Patient had significant response/near  complete response on recent imaging.-Patient current clinical symptoms are not suggestive of any progressive disease.  Question related to [facility] or ventoclax.  Discussed with pharmacy; awaiting review of literature.   #Tachypnea/chest pain intermittent-the etiology is unclear.  Bilateral pleural effusion small-likely secondary to atelectasis.  Thoracentesis not done/not safe-however overall significantly improved.  Appreciate cardiology pulmonary and ID evaluation.  Patient will follow-up with me in the clinic next week.  We will continue to hold venetoclax at this time; until next visit.  Discussed with patient in detail.  He is in agreement.    Cammie Sickle, MD 01/19/2022  8:49 PM

## 2022-01-19 NOTE — Discharge Summary (Addendum)
Physician Discharge Summary   Patient: Andres Mullins MRN: [id number] DOB: 11-17-1951  Admit date:     01/16/2022  Discharge date: 01/19/22  Discharge Physician: Shawna Clamp   PCP: [name], PA-C   Recommendations at discharge:  Advised to follow-up with PCP in 1 week. Advised to follow-up with oncology Dr. Rogue Bussing as scheduled. Recheck CBC BMP in 1 week. Patient needs work-up for autoimmune diseases.  Discharge Diagnoses: Principal Problem:   Acute hypoxemic respiratory failure (HCC) Active Problems:   Herpes   Tobacco abuse   CLL (chronic lymphocytic leukemia) (HCC)   Pulmonary embolism (HCC)   Acute diastolic CHF (congestive heart failure) (HCC)   History of pericarditis   Pleural effusion due to CHF (congestive heart failure) (HCC)   Fever with chills  Resolved Problems:   * No resolved hospital problems. [facility] Course: This 71 y.o. 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  He is found to have bilateral pleural effusion.  IR was consulted for thoracocentesis.  On ultrasound fluid was minimal , could not be drained.  Infectious disease, pulmonology and oncology consulted.  Infectious disease recommended QuantiFERON gold test and work-up for vasculitis.  Echocardiogram shows EF 55 to 60%..  Patient is cleared from pulmonology and infectious disease to be discharged.  Patient will follow-up with Dr. Rogue Bussing as scheduled.  Assessment and Plan: * Acute hypoxemic respiratory failure ([place])- (present on admission) Seems multifactorial  Bilateral pleural effusion, Pericardial effusion, CHF, CLL and PE.  He is on 2 L oxygen via nasal cannula.   He is successfully weaned down to room air.  Fever with chills- (present on admission) He reports low-grade fever with chills.  He  reports two episodes where he had drenching night sweats since February 11.  ID consulted.  hold off any antibiotics for now. Obtain QuantiFERON gold  Pleural effusion due to CHF (congestive heart failure) (HCC) CT scan shows bilateral pleural effusion.  Left greater than right.  Pulmonology consulted.  Ultrasound-guided thoracentesis ordered.  IR has evaluated the patient. Thoracocentesis not attempted due to the trace amount of effusion.  History of pericarditis Colchicine discontinued. Repeat 2D echo shows no recurrence of pericarditis  Acute diastolic CHF (congestive heart failure) ([place])- (present on admission) Patient presents for evaluation of worsening shortness of breath and imaging shows bilateral pleural effusions. last 2D echocardiogram from 11/22 shows an LVEF of 60 to 65%.  Repeat echo shows EF of 55 to 60%.  Grade 2 diastolic dysfunction.  No cardiac tamponade Patient is status post pericardiocentesis for cardiac tamponade in 11/22 Continue Lasix 40 mg daily for now. Patient appears euvolemic on discharge.  Pulmonary embolism ([place])- (present on admission) Continue apixaban 5 mg p.o. twice daily.   This was diagnosed on a CT in 09/2021  Repeat CT angiogram is negative for acute pulmonary embolism  CLL (chronic lymphocytic leukemia) ([place])- (present on admission) Stable. Hold Venetoclax Discussed with Dr. Rogue Bussing.  Tobacco abuse- (present on admission) Smoking cessation was discussed with patient in detail. Patient states that he has cut back on the number of cigarettes he smokes He declines a nicotine transdermal patch at this time  Herpes- (present on admission) Continue acyclovir    Consultants: Pulmonology, infectious diseases Procedures performed: None Disposition: Home Diet recommendation:  Discharge Diet Orders (From admission, onward)     Start  Ordered   01/19/22 0000  Diet - low sodium heart healthy        01/19/22 1044   01/19/22 0000  Diet  Carb Modified        01/19/22 1044           Cardiac diet  DISCHARGE MEDICATION: Allergies as of 01/19/2022       Reactions   Gluten Meal    Gluten intolerance        Medication List     STOP taking these medications    acetaminophen 325 MG tablet Commonly known as: TYLENOL   colchicine 0.6 MG tablet   diphenhydrAMINE 25 MG tablet Commonly known as: BENADRYL   lidocaine 5 % Commonly known as: Lidoderm   montelukast 10 MG tablet Commonly known as: Singulair   pantoprazole 40 MG tablet Commonly known as: PROTONIX       TAKE these medications    acyclovir 400 MG tablet Commonly known as: ZOVIRAX Take 1 tablet (400 mg total) by mouth 2 (two) times daily.   apixaban 5 MG Tabs tablet Commonly known as: ELIQUIS Take 1 tablet (5 mg total) by mouth 2 (two) times daily.   cholecalciferol 25 MCG (1000 UNIT) tablet Commonly known as: VITAMIN D3 Take 1,000 Units by mouth daily.   hydrocortisone valerate cream 0.2 % Commonly known as: WESTCORT Apply 1 application topically 2 (two) times daily.   lidocaine-prilocaine cream Commonly known as: EMLA Apply 30 -45 mins prior to port access.   oxyCODONE 5 MG immediate release tablet Commonly known as: Oxy IR/ROXICODONE Take 1 tablet (5 mg total) by mouth every 6 (six) hours as needed for moderate pain or severe pain.   Venclexta 100 MG tablet Generic drug: venetoclax Take 4 tablets (400 mg total) by mouth daily. Take 200 mg once a day x 1 week; and then 446m once a day. Tablets should be swallowed whole with a meal and a full glass of water.        Follow-up Information     TDelsa Grana PA-C Follow up on 01/31/2022.   Specialty: Family Medicine Why: @ 8am Contact information: [phone]         ENelva Bush MD Follow up on 02/16/2022.   Specialty: Cardiology Why: @ 10:05am Patient will be placed on the waitlist. Contact information: [id number] [id number]                Discharge Exam: Filed Weights   01/16/22 2355 01/18/22 0500 01/19/22 0433  Weight: 72.2 kg 71.2 kg 71.3 kg   General exam: Appears comfortable, not in any acute distress.  Respiratory system: CTA bilaterally, no wheezing, no crackles, normal respiratory effort. Cardiovascular system: S1-S2 heard, regular rate and rhythm, no murmur. Gastrointestinal system: Abdomen is soft, nontender, nondistended, BS+ Central nervous system: Alert and oriented x3, no neurological deficits. Extremities: No edema, no cyanosis, no clubbing. Psychiatry: Mood, judgment, insight normal   Condition at discharge: stable  The results of significant diagnostics from this hospitalization (including imaging, microbiology, ancillary and laboratory) are listed below for reference.   Imaging Studies: DG Chest 2 View  Result Date: 01/16/2022 CLINICAL DATA:  Shortness of breath. EXAM: CHEST - 2 VIEW COMPARISON:  Chest radiographs and CT 01/05/2022 FINDINGS: A right jugular Port-A-Cath terminates near the superior cavoatrial junction. The cardiomediastinal silhouette is unchanged with normal heart size. There are new small bilateral pleural effusions, and  there are new mild left basilar opacities which are partially curvilinear in configuration. No edema or pneumothorax is evident. No acute osseous abnormality is seen. IMPRESSION: New small bilateral pleural effusions with mild left basilar opacities likely reflecting atelectasis although infection is not excluded. Electronically Signed   By: Logan Bores M.D.   On: 01/16/2022 12:23   DG Chest 2 View  Result Date: 01/05/2022 CLINICAL DATA:  Chest pain EXAM: CHEST - 2 VIEW COMPARISON:  Chest x-ray dated October 24, 2021 FINDINGS: Right chest wall port with tip in the superior cavoatrial junction. Cardiac and mediastinal contours within normal limits. Mild bibasilar atelectasis. Lungs  otherwise clear. No pleural effusion or pneumothorax. IMPRESSION: No active cardiopulmonary disease. Electronically Signed   By: Yetta Glassman M.D.   On: 01/05/2022 10:59   CT Chest Wo Contrast  Result Date: 01/05/2022 CLINICAL DATA:  Right-sided chest pain and tachycardia. Denies shortness of breath. History of leukemia. EXAM: CT CHEST WITHOUT CONTRAST TECHNIQUE: Multidetector CT imaging of the chest was performed following the standard protocol without IV contrast. RADIATION DOSE REDUCTION: This exam was performed according to the departmental dose-optimization program which includes automated exposure control, adjustment of the mA and/or kV according to patient size and/or use of iterative reconstruction technique. COMPARISON:  CTA chest dated October 16, 2021. FINDINGS: Cardiovascular: Normal heart size. Unchanged small pericardial effusion. No thoracic aortic aneurysm. Unchanged right chest wall port catheter with tip at the cavoatrial junction. Mediastinum/Nodes: Decreasing bilateral axillary lymphadenopathy. Index nodule in the left axilla currently measures 1.1 cm in short axis (series 2, image 29), previously 1.5 cm. No enlarged mediastinal lymph nodes. The thyroid gland, trachea, and esophagus demonstrate no significant findings. Lungs/Pleura: Unchanged mild paraseptal and centrilobular emphysema. Resolved left pleural effusion with improved aeration at the left lower lobe. No consolidation or pneumothorax. Upper Abdomen: No acute abnormality. Musculoskeletal: No acute or significant osseous findings. Old sternal body fracture again noted. IMPRESSION: 1.  No acute intrathoracic process. 2. Decreasing bilateral axillary lymphadenopathy. 3. Resolved left pleural effusion. Unchanged small pericardial effusion. 4. Emphysema (ICD10-J43.9). Electronically Signed   By: [name] Dubin M.D.   On: 01/05/2022 12:32   CT Angio Chest PE W and/or Wo Contrast  Result Date: 01/16/2022 CLINICAL DATA:  Pulmonary  embolism suspected. History of recent pulmonary embolus. History of pericardial effusion. EXAM: CT ANGIOGRAPHY CHEST WITH CONTRAST TECHNIQUE: Multidetector CT imaging of the chest was performed using the standard protocol during bolus administration of intravenous contrast. Multiplanar CT image reconstructions and MIPs were obtained to evaluate the vascular anatomy. RADIATION DOSE REDUCTION: This exam was performed according to the departmental dose-optimization program which includes automated exposure control, adjustment of the mA and/or kV according to patient size and/or use of iterative reconstruction technique. CONTRAST:  26m OMNIPAQUE IOHEXOL 350 MG/ML SOLN COMPARISON:  CT angio chest on 10/16/2021 FINDINGS: Cardiovascular: Heart size is normal. Trace pericardial effusion similar to prior study. The pulmonary arteries are well opacified. There is no acute pulmonary embolus. There has been resolution of thrombus within the RIGHT LOWER lobe pulmonary artery. Mediastinum/Nodes: The visualized portion of the thyroid gland has a normal appearance. Esophagus is unremarkable. No significant mediastinal, hilar, or axillary adenopathy. Lungs/Pleura: There small bilateral pleural effusions, LEFT greater than RIGHT. There is associated atelectasis at both lung bases, LEFT greater than RIGHT. No focal consolidations. Minimal paraseptal emphysema at the RIGHT lung apex. Upper Abdomen: Partially imaged LEFT renal cyst is at least 5.7 centimeters. Gallbladder is present. Musculoskeletal: Deformity of the sternum consistent with  remote fracture. Moderate degenerative changes in the thoracic spine. There is a remote superior endplate fracture of T8. Review of the MIP images confirms the above findings. IMPRESSION: 1. Technically adequate exam showing no acute pulmonary embolus. 2. Stable small pericardial effusion. 3. New bilateral pleural effusions associated with atelectasis, LEFT greater than RIGHT. 4. Remote sternal  fracture. 5. Remote superior endplate fracture of T8. Electronically Signed   By: Nolon Nations M.D.   On: 01/16/2022 13:59   [place] CHEST (PLEURAL EFFUSION)  Result Date: 01/17/2022 CLINICAL DATA:  Pleural effusion EXAM: CHEST ULTRASOUND COMPARISON:  None. FINDINGS: Limited sonographic exam of the chest was performed for fluid survey. Images demonstrate tiny right pleural effusion. There is a small left pleural effusion, with overlying atelectatic lung. No safe window identified for image guided thoracentesis. No thoracentesis performed. IMPRESSION: Tiny right and small left pleural effusions, without safe window for percutaneous drainage. No thoracentesis performed. Electronically Signed   By: Albin Felling M.D.   On: 01/17/2022 15:23   ECHOCARDIOGRAM COMPLETE  Result Date: 01/17/2022    ECHOCARDIOGRAM REPORT   Patient Name:   Andres Mullins [facility] Date of Exam: 01/17/2022 Medical Rec #:  [id number]       Height:       66.0 in Accession #:    [id number]      Weight:       159.2 lb Date of Birth:  March 18, 1951       BSA:          1.815 m Patient Age:    11 years        BP:           107/76 mmHg Patient Gender: M               HR:           100 bpm. Exam Location:  [facility] Procedure: 2D Echo, Color Doppler and Cardiac Doppler STAT ECHO Indications:     I31.3 Pericardial effusion  History:         Patient has prior history of Echocardiogram examinations, most                  recent 10/25/2021. Arrythmias:RBBB; Risk Factors:Dyslipidemia                  and Current Smoker.  Sonographer:     Charmayne Sheer Referring Phys:  [place] Diagnosing Phys: [name] MD IMPRESSIONS  1. Left ventricular ejection fraction, by estimation, is 55 to 60%. The left ventricle has normal function. Left ventricular endocardial border not optimally defined to evaluate regional wall motion. Left ventricular diastolic parameters are consistent with Grade II diastolic dysfunction (pseudonormalization).  2. Right ventricular systolic  function is normal. The right ventricular size is normal. Mildly increased right ventricular wall thickness.  3. The pericardium appears mildly thickened.. There is no evidence of cardiac tamponade.  4. The mitral valve was not well visualized. Trivial mitral valve regurgitation. No evidence of mitral stenosis.  5. The aortic valve has an indeterminant number of cusps. Aortic valve regurgitation is mild. No aortic stenosis is present.  6. Aortic dilatation noted. There is mild dilatation of the aortic root, measuring 40 mm. There is mild dilatation of the ascending aorta, measuring 36 mm.  7. The inferior vena cava is normal in size with greater than 50% respiratory variability, suggesting right atrial pressure of 3 mmHg. FINDINGS  Left Ventricle: Left ventricular ejection fraction, by estimation, is 55 to  60%. The left ventricle has normal function. Left ventricular endocardial border not optimally defined to evaluate regional wall motion. The left ventricular internal cavity size was normal in size. There is no left ventricular hypertrophy. Left ventricular diastolic parameters are consistent with Grade II diastolic dysfunction (pseudonormalization). Right Ventricle: The right ventricular size is normal. Mildly increased right ventricular wall thickness. Right ventricular systolic function is normal. Left Atrium: Left atrial size was normal in size. Right Atrium: Right atrial size was normal in size. Pericardium: The pericardium appears mildly thickened. Trivial pericardial effusion is present. There is no evidence of cardiac tamponade. Mitral Valve: The mitral valve was not well visualized. Trivial mitral valve regurgitation. No evidence of mitral valve stenosis. MV peak gradient, 2.0 mmHg. The mean mitral valve gradient is 1.0 mmHg. Tricuspid Valve: The tricuspid valve is not well visualized. Tricuspid valve regurgitation is not demonstrated. Aortic Valve: The aortic valve has an indeterminant number of cusps.  Aortic valve regurgitation is mild. No aortic stenosis is present. Aortic valve mean gradient measures 2.0 mmHg. Aortic valve peak gradient measures 4.3 mmHg. Aortic valve area, by VTI measures 4.29 cm. Pulmonic Valve: The pulmonic valve was not well visualized. Pulmonic valve regurgitation is not visualized. No evidence of pulmonic stenosis. Aorta: Aortic dilatation noted. There is mild dilatation of the aortic root, measuring 40 mm. There is mild dilatation of the ascending aorta, measuring 36 mm. Pulmonary Artery: The pulmonary artery is not well seen. Venous: The inferior vena cava is normal in size with greater than 50% respiratory variability, suggesting right atrial pressure of 3 mmHg. IAS/Shunts: The interatrial septum was not well visualized. Additional Comments: There is a small pleural effusion in the left lateral region.  LEFT VENTRICLE PLAX 2D LVIDd:         5.12 cm   Diastology LVIDs:         3.84 cm   LV e' medial:    6.74 cm/s LV PW:         0.96 cm   LV E/e' medial:  8.5 LV IVS:        0.77 cm   LV e' lateral:   10.00 cm/s LVOT diam:     2.00 cm   LV E/e' lateral: 5.7 LV SV:         53 LV SV Index:   29 LVOT Area:     3.14 cm  RIGHT VENTRICLE RV Basal diam:  3.38 cm LEFT ATRIUM             Index        RIGHT ATRIUM           Index LA diam:        3.90 cm 2.15 cm/m   RA Area:     17.60 cm LA Vol (A2C):   23.7 ml 13.06 ml/m  RA Volume:   47.40 ml  26.12 ml/m LA Vol (A4C):   26.1 ml 14.38 ml/m LA Biplane Vol: 26.9 ml 14.82 ml/m  AORTIC VALVE                    PULMONIC VALVE AV Area (Vmax):    3.32 cm     PV Vmax:       1.00 m/s AV Area (Vmean):   3.29 cm     PV Vmean:      67.600 cm/s AV Area (VTI):     4.29 cm     PV VTI:  0.135 m AV Vmax:           104.00 cm/s  PV Peak grad:  4.0 mmHg AV Vmean:          67.800 cm/s  PV Mean grad:  2.0 mmHg AV VTI:            0.123 m AV Peak Grad:      4.3 mmHg AV Mean Grad:      2.0 mmHg LVOT Vmax:         110.00 cm/s LVOT Vmean:        70.900 cm/s  LVOT VTI:          0.168 m LVOT/AV VTI ratio: 1.37  AORTA Ao Root diam: 4.00 cm MITRAL VALVE MV Area (PHT): 5.79 cm    SHUNTS MV Area VTI:   2.95 cm    Systemic VTI:  0.17 m MV Peak grad:  2.0 mmHg    Systemic Diam: 2.00 cm MV Mean grad:  1.0 mmHg MV Vmax:       0.70 m/s MV Vmean:      47.9 cm/s MV Decel Time: 131 msec MV E velocity: 57.40 cm/s MV A velocity: 51.00 cm/s MV E/A ratio:  1.13 Harrell Gave End MD Electronically signed by [name] MD Signature Date/Time: 01/17/2022/10:51:23 AM    Final     Microbiology: Results for orders placed or performed during the hospital encounter of 01/16/22  Resp Panel by RT-PCR (Flu A&B, Covid) Nasopharyngeal Swab     Status: None   Collection Time: 01/16/22  1:07 PM   Specimen: Nasopharyngeal Swab; Nasopharyngeal(NP) swabs in vial transport medium  Result Value Ref Range Status   SARS Coronavirus 2 by RT PCR NEGATIVE NEGATIVE Final    Comment: (NOTE) SARS-CoV-2 target nucleic acids are NOT DETECTED.  The SARS-CoV-2 RNA is generally detectable in upper respiratory specimens during the acute phase of infection. The lowest concentration of SARS-CoV-2 viral copies this assay can detect is 138 copies/mL. A negative result does not preclude SARS-Cov-2 infection and should not be used as the sole basis for treatment or other patient management decisions. A negative result may occur with  improper specimen collection/handling, submission of specimen other than nasopharyngeal swab, presence of viral mutation(s) within the areas targeted by this assay, and inadequate number of viral copies(<138 copies/mL). A negative result must be combined with clinical observations, patient history, and epidemiological information. The expected result is Negative.  Fact Sheet for Patients:  EntrepreneurPulse.com.au  Fact Sheet for Healthcare Providers:  IncredibleEmployment.be  This test is no t yet approved or cleared by the  [place] FDA and  has been authorized for detection and/or diagnosis of SARS-CoV-2 by FDA under an Emergency Use Authorization (EUA). This EUA will remain  in effect (meaning this test can be used) for the duration of the COVID-19 declaration under Section 564(b)(1) of the Act, 21 U.S.C.section 360bbb-3(b)(1), unless the authorization is terminated  or revoked sooner.       Influenza A by PCR NEGATIVE NEGATIVE Final   Influenza B by PCR NEGATIVE NEGATIVE Final    Comment: (NOTE) The Xpert Xpress SARS-CoV-2/FLU/RSV plus assay is intended as an aid in the diagnosis of influenza from Nasopharyngeal swab specimens and should not be used as a sole basis for treatment. Nasal washings and aspirates are unacceptable for Xpert Xpress SARS-CoV-2/FLU/RSV testing.  Fact Sheet for Patients: EntrepreneurPulse.com.au  Fact Sheet for Healthcare Providers: IncredibleEmployment.be  This test is not yet approved or cleared by the [place]  FDA and has been authorized for detection and/or diagnosis of SARS-CoV-2 by FDA under an Emergency Use Authorization (EUA). This EUA will remain in effect (meaning this test can be used) for the duration of the COVID-19 declaration under Section 564(b)(1) of the Act, 21 U.S.C. section 360bbb-3(b)(1), unless the authorization is terminated or revoked.  Performed at [facility], [place], [place] [address]   Respiratory (~20 pathogens) panel by PCR     Status: None   Collection Time: 01/16/22  1:07 PM   Specimen: Nasopharyngeal Swab; Respiratory  Result Value Ref Range Status   Adenovirus NOT DETECTED NOT DETECTED Final   Coronavirus 229E NOT DETECTED NOT DETECTED Final    Comment: (NOTE) The Coronavirus on the Respiratory Panel, DOES NOT test for the novel  Coronavirus (2019 nCoV)    Coronavirus HKU1 NOT DETECTED NOT DETECTED Final   Coronavirus NL63 NOT DETECTED NOT DETECTED Final    Coronavirus OC43 NOT DETECTED NOT DETECTED Final   Metapneumovirus NOT DETECTED NOT DETECTED Final   Rhinovirus / Enterovirus NOT DETECTED NOT DETECTED Final   Influenza A NOT DETECTED NOT DETECTED Final   Influenza B NOT DETECTED NOT DETECTED Final   Parainfluenza Virus 1 NOT DETECTED NOT DETECTED Final   Parainfluenza Virus 2 NOT DETECTED NOT DETECTED Final   Parainfluenza Virus 3 NOT DETECTED NOT DETECTED Final   Parainfluenza Virus 4 NOT DETECTED NOT DETECTED Final   Respiratory Syncytial Virus NOT DETECTED NOT DETECTED Final   Bordetella pertussis NOT DETECTED NOT DETECTED Final   Bordetella Parapertussis NOT DETECTED NOT DETECTED Final   Chlamydophila pneumoniae NOT DETECTED NOT DETECTED Final   Mycoplasma pneumoniae NOT DETECTED NOT DETECTED Final    Comment: Performed at [facility], [place]. [address] [place], [place] [address]  C Difficile Quick Screen w PCR reflex     Status: None   Collection Time: 01/18/22  9:30 AM   Specimen: STOOL  Result Value Ref Range Status   C Diff antigen NEGATIVE NEGATIVE Final   C Diff toxin NEGATIVE NEGATIVE Final   C Diff interpretation No C. difficile detected.  Final    Comment: Performed at [facility], [address] [place], [place] [address]    Labs: CBC: Recent Labs  Lab 01/16/22 1203 01/16/22 1253 01/17/22 0616 01/18/22 0530  WBC 12.7* 12.4* 12.9* 9.5  NEUTROABS  --  9.6*  --   --   HGB 15.3 13.8 13.7 13.7  HCT 46.5 41.6 39.7 39.9  MCV 89.4 87.9 86.1 87.1  PLT 641* 582* 557* 088*   Basic Metabolic Panel: Recent Labs  Lab 01/16/22 1203 01/17/22 0616 01/18/22 0530  NA 134* 134* 137  K 3.9 4.0 3.9  CL 98 101 102  CO2 25 22 24   GLUCOSE 124* 131* 105*  BUN 10 11 15   CREATININE 0.85 0.74 0.72  CALCIUM 9.3 9.0 9.0   Liver Function Tests: Recent Labs  Lab 01/16/22 1253 01/17/22 1704 01/18/22 0530  AST 17 14* 12*  ALT 14 15 14   ALKPHOS 67 71 64  BILITOT 0.8 0.3 0.4  PROT 6.7 6.8 6.3*  ALBUMIN  3.0* 3.1* 2.8*   CBG: No results for input(s): GLUCAP in the last 168 hours.  Discharge time spent: greater than 30 minutes.  Signed: Shawna Clamp, MD [facility] 01/19/2022

## 2022-01-19 NOTE — Progress Notes (Signed)
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  He has rib and sternal fracture history.   Patient was seen in hospital early this month for possible LRTI and had treatment but came back due to progressive severe symptoms of dyspnea.   He admits to chronic diarreah.  He had thoracentesis ordered with no safe window for aspiration.   This is likely due to diuresis of >1L of fluid.   Imaging independently reviewed by me with moderate left and mild right pleural effusion with compressive atelectasis bilaterally otherwise no severe abnormalities.  01/19/22- Mr Gut is on room air.  He remains mildly tachycardic but normoxic on room air with no dyspnea able to take tidal volumes 1725m on IS which is very impressive.  Ive left my contact information should he have any pulmonary concerns and congratulated patient on great improvement.  He has additional follow up with med/onc and is cleared for DC from pulmonary perspective.   PAST MEDICAL HISTORY   Past Medical History:  Diagnosis Date   Atherosclerosis of coronary artery 09/28/2016   Chest CT Nov 2017   Atrial tachycardia (HCC)    CLL (chronic lymphocytic leukemia) (HCC)    Elevated C-reactive protein (CRP)    Elevated PSA    Fractured sternum 1999   Herpes    History of rib fracture 1999   multiple   Hyperlipidemia    Psoriasis    [place]    Rheumatoid factor positive 02/2014   Rosacea    [place] 09/28/2016   Chest CT Nov 2017   Tobacco abuse    Vitamin D deficiency disease       SURGICAL HISTORY   Past Surgical History:  Procedure Laterality Date   APPENDECTOMY  1974   PERICARDIOCENTESIS N/A 10/03/2021   Procedure: PERICARDIOCENTESIS;  Surgeon: VJettie Booze MD;  [facility];  Service: Cardiovascular;  Laterality: N/A;   PORTA CATH INSERTION N/A 06/06/2021   Procedure: PORTA CATH INSERTION;  Surgeon: DAlgernon Huxley MD;  [facility];  Service: Cardiovascular;  Laterality: N/A;   SHOULDER ARTHROSCOPY  10/23/2016   Procedure: ARTHROSCOPY SHOULDER WITH DISTAL CLAVICLE EXCISION, PARTIAL ACROMIONECTOMY, AND DEBRIDEMENT;  Surgeon: HEarnestine Leys MD;  Location: [facility];  Service: Orthopedics;;   TIBIA FRACTURE SURGERY Left 2004   titanium rod, ORIF     FAMILY HISTORY   Family History  Problem Relation Age of Onset   Heart disease Father    Osteoarthritis Father    Alzheimer's disease Father    Cancer Neg Hx    Diabetes Neg Hx    Hypertension Neg Hx    Stroke Neg Hx    COPD Neg Hx      SOCIAL HISTORY   Social History   Tobacco Use   Smoking status: Every Day    Packs/day: 0.50    Years: 45.00    Pack years: 22.50    Types: Cigarettes  Smokeless tobacco: Never   Tobacco comments:    11/6- currently smoking 5 cigarettes a day   Vaping Use   Vaping Use: Never used  Substance Use Topics   Alcohol use: Yes    Alcohol/week: 3.0 standard drinks    Types: 3 Glasses of wine per week    Comment: a couple glasses of wine a week   Drug use: No     MEDICATIONS    Home Medication:    Current Medication:  Current Facility-Administered Medications:    acetaminophen (TYLENOL) tablet 650 mg, 650 mg, Oral, Q6H PRN, Agbata, Tochukwu, MD   acyclovir (ZOVIRAX) 200 MG capsule 400 mg, 400 mg, Oral, BID, Noralee Space, RPH, 400 mg at 01/19/22 7371   apixaban (ELIQUIS) tablet 5 mg, 5 mg, Oral, BID, Agbata, Tochukwu, MD, 5 mg at 01/19/22 0626   nicotine (NICODERM CQ - dosed in mg/24 hours) patch 14 mg, 14  mg, Transdermal, Daily, Lanney Gins, Shatora Weatherbee, MD, 14 mg at 01/18/22 0000   ondansetron (ZOFRAN) tablet 4 mg, 4 mg, Oral, Q6H PRN **OR** ondansetron (ZOFRAN) injection 4 mg, 4 mg, Intravenous, Q6H PRN, Agbata, Tochukwu, MD   oxyCODONE (Oxy IR/ROXICODONE) immediate release tablet 5 mg, 5 mg, Oral, Q6H PRN, Agbata, Tochukwu, MD    ALLERGIES   Gluten meal     REVIEW OF SYSTEMS    Review of Systems:  Gen:  Denies  fever, sweats, chills weigh loss  HEENT: Denies blurred vision, double vision, ear pain, eye pain, hearing loss, nose bleeds, sore throat Cardiac:  No dizziness, chest pain or heaviness, chest tightness,edema Resp:   Denies cough or sputum porduction, shortness of breath,wheezing, hemoptysis,  Gi: Denies swallowing difficulty, stomach pain, nausea or vomiting, diarrhea, constipation, bowel incontinence Gu:  Denies bladder incontinence, burning urine Ext:   Denies Joint pain, stiffness or swelling Skin: Denies  skin rash, easy bruising or bleeding or hives Endoc:  Denies polyuria, polydipsia , polyphagia or weight change Psych:   Denies depression, insomnia or hallucinations   Other:  All other systems negative   VS: BP 109/73 (BP Location: Left Arm)    Pulse 91    Temp 97.7 F (36.5 C)    Resp 18    Ht 5' 6"  (1.676 m)    Wt 71.3 kg    SpO2 97%    BMI 25.37 kg/m      PHYSICAL EXAM    GENERAL:NAD, no fevers, chills, no weakness no fatigue HEAD: Normocephalic, atraumatic.  EYES: Pupils equal, round, reactive to light. Extraocular muscles intact. No scleral icterus.  MOUTH: Moist mucosal membrane. Dentition intact. No abscess noted.  EAR, NOSE, THROAT: Clear without exudates. No external lesions.  NECK: Supple. No thyromegaly. No nodules. No JVD.  PULMONARY: clear to auscultation bilaterally CARDIOVASCULAR: S1 and S2. Regular rate and rhythm. No murmurs, rubs, or gallops. No edema. Pedal pulses 2+ bilaterally.  GASTROINTESTINAL: Soft, nontender, nondistended. No masses.  Positive bowel sounds. No hepatosplenomegaly.  MUSCULOSKELETAL: No swelling, clubbing, or edema. Range of motion full in all extremities.  NEUROLOGIC: Cranial nerves II through XII are intact. No gross focal neurological deficits. Sensation intact. Reflexes intact.  SKIN: No ulceration, lesions, rashes, or cyanosis. Skin warm and dry. Turgor intact.  PSYCHIATRIC: Mood, affect within normal limits. The patient is awake, alert and oriented x 3. Insight, judgment intact.       IMAGING    DG Chest 2 View  Result Date: 01/16/2022 CLINICAL DATA:  Shortness of breath. EXAM: CHEST - 2 VIEW  COMPARISON:  Chest radiographs and CT 01/05/2022 FINDINGS: A right jugular Port-A-Cath terminates near the superior cavoatrial junction. The cardiomediastinal silhouette is unchanged with normal heart size. There are new small bilateral pleural effusions, and there are new mild left basilar opacities which are partially curvilinear in configuration. No edema or pneumothorax is evident. No acute osseous abnormality is seen. IMPRESSION: New small bilateral pleural effusions with mild left basilar opacities likely reflecting atelectasis although infection is not excluded. Electronically Signed   By: Logan Bores M.D.   On: 01/16/2022 12:23   DG Chest 2 View  Result Date: 01/05/2022 CLINICAL DATA:  Chest pain EXAM: CHEST - 2 VIEW COMPARISON:  Chest x-ray dated October 24, 2021 FINDINGS: Right chest wall port with tip in the superior cavoatrial junction. Cardiac and mediastinal contours within normal limits. Mild bibasilar atelectasis. Lungs otherwise clear. No pleural effusion or pneumothorax. IMPRESSION: No active cardiopulmonary disease. Electronically Signed   By: Yetta Glassman M.D.   On: 01/05/2022 10:59   CT Chest Wo Contrast  Result Date: 01/05/2022 CLINICAL DATA:  Right-sided chest pain and tachycardia. Denies shortness of breath. History of leukemia. EXAM: CT CHEST WITHOUT CONTRAST TECHNIQUE: Multidetector CT  imaging of the chest was performed following the standard protocol without IV contrast. RADIATION DOSE REDUCTION: This exam was performed according to the departmental dose-optimization program which includes automated exposure control, adjustment of the mA and/or kV according to patient size and/or use of iterative reconstruction technique. COMPARISON:  CTA chest dated October 16, 2021. FINDINGS: Cardiovascular: Normal heart size. Unchanged small pericardial effusion. No thoracic aortic aneurysm. Unchanged right chest wall port catheter with tip at the cavoatrial junction. Mediastinum/Nodes: Decreasing bilateral axillary lymphadenopathy. Index nodule in the left axilla currently measures 1.1 cm in short axis (series 2, image 29), previously 1.5 cm. No enlarged mediastinal lymph nodes. The thyroid gland, trachea, and esophagus demonstrate no significant findings. Lungs/Pleura: Unchanged mild paraseptal and centrilobular emphysema. Resolved left pleural effusion with improved aeration at the left lower lobe. No consolidation or pneumothorax. Upper Abdomen: No acute abnormality. Musculoskeletal: No acute or significant osseous findings. Old sternal body fracture again noted. IMPRESSION: 1.  No acute intrathoracic process. 2. Decreasing bilateral axillary lymphadenopathy. 3. Resolved left pleural effusion. Unchanged small pericardial effusion. 4. Emphysema (ICD10-J43.9). Electronically Signed   By: [name] Dubin M.D.   On: 01/05/2022 12:32   CT Angio Chest PE W and/or Wo Contrast  Result Date: 01/16/2022 CLINICAL DATA:  Pulmonary embolism suspected. History of recent pulmonary embolus. History of pericardial effusion. EXAM: CT ANGIOGRAPHY CHEST WITH CONTRAST TECHNIQUE: Multidetector CT imaging of the chest was performed using the standard protocol during bolus administration of intravenous contrast. Multiplanar CT image reconstructions and MIPs were obtained to evaluate the vascular anatomy. RADIATION DOSE  REDUCTION: This exam was performed according to the departmental dose-optimization program which includes automated exposure control, adjustment of the mA and/or kV according to patient size and/or use of iterative reconstruction technique. CONTRAST:  63m OMNIPAQUE IOHEXOL 350 MG/ML SOLN COMPARISON:  CT angio chest on 10/16/2021 FINDINGS: Cardiovascular: Heart size is normal. Trace pericardial effusion similar to prior study. The pulmonary arteries are well opacified. There is no acute pulmonary embolus. There has been resolution of thrombus within the RIGHT LOWER lobe pulmonary artery. Mediastinum/Nodes: The visualized portion of the thyroid gland has a normal appearance. Esophagus is unremarkable. No significant mediastinal, hilar, or axillary adenopathy. Lungs/Pleura: There small bilateral pleural effusions, LEFT greater than RIGHT. There is associated atelectasis at both lung bases, LEFT greater  than RIGHT. No focal consolidations. Minimal paraseptal emphysema at the RIGHT lung apex. Upper Abdomen: Partially imaged LEFT renal cyst is at least 5.7 centimeters. Gallbladder is present. Musculoskeletal: Deformity of the sternum consistent with remote fracture. Moderate degenerative changes in the thoracic spine. There is a remote superior endplate fracture of T8. Review of the MIP images confirms the above findings. IMPRESSION: 1. Technically adequate exam showing no acute pulmonary embolus. 2. Stable small pericardial effusion. 3. New bilateral pleural effusions associated with atelectasis, LEFT greater than RIGHT. 4. Remote sternal fracture. 5. Remote superior endplate fracture of T8. Electronically Signed   By: Nolon Nations M.D.   On: 01/16/2022 13:59   [place] CHEST (PLEURAL EFFUSION)  Result Date: 01/17/2022 CLINICAL DATA:  Pleural effusion EXAM: CHEST ULTRASOUND COMPARISON:  None. FINDINGS: Limited sonographic exam of the chest was performed for fluid survey. Images demonstrate tiny right pleural effusion.  There is a small left pleural effusion, with overlying atelectatic lung. No safe window identified for image guided thoracentesis. No thoracentesis performed. IMPRESSION: Tiny right and small left pleural effusions, without safe window for percutaneous drainage. No thoracentesis performed. Electronically Signed   By: Albin Felling M.D.   On: 01/17/2022 15:23   ECHOCARDIOGRAM COMPLETE  Result Date: 01/17/2022    ECHOCARDIOGRAM REPORT   Patient Name:   PERRIS CONWELL [facility] Date of Exam: 01/17/2022 Medical Rec #:  [id number]       Height:       66.0 in Accession #:    [id number]      Weight:       159.2 lb Date of Birth:  January 09, 1951       BSA:          1.815 m Patient Age:    23 years        BP:           107/76 mmHg Patient Gender: M               HR:           100 bpm. Exam Location:  [facility] Procedure: 2D Echo, Color Doppler and Cardiac Doppler STAT ECHO Indications:     I31.3 Pericardial effusion  History:         Patient has prior history of Echocardiogram examinations, most                  recent 10/25/2021. Arrythmias:RBBB; Risk Factors:Dyslipidemia                  and Current Smoker.  Sonographer:     Charmayne Sheer Referring Phys:  [place] Diagnosing Phys: [name] MD IMPRESSIONS  1. Left ventricular ejection fraction, by estimation, is 55 to 60%. The left ventricle has normal function. Left ventricular endocardial border not optimally defined to evaluate regional wall motion. Left ventricular diastolic parameters are consistent with Grade II diastolic dysfunction (pseudonormalization).  2. Right ventricular systolic function is normal. The right ventricular size is normal. Mildly increased right ventricular wall thickness.  3. The pericardium appears mildly thickened.. There is no evidence of cardiac tamponade.  4. The mitral valve was not well visualized. Trivial mitral valve regurgitation. No evidence of mitral stenosis.  5. The aortic valve has an indeterminant number of cusps. Aortic valve  regurgitation is mild. No aortic stenosis is present.  6. Aortic dilatation noted. There is mild dilatation of the aortic root, measuring 40 mm. There is mild dilatation of the ascending aorta, measuring 36 mm.  7. The inferior vena cava is normal in size with greater than 50% respiratory variability, suggesting right atrial pressure of 3 mmHg. FINDINGS  Left Ventricle: Left ventricular ejection fraction, by estimation, is 55 to 60%. The left ventricle has normal function. Left ventricular endocardial border not optimally defined to evaluate regional wall motion. The left ventricular internal cavity size was normal in size. There is no left ventricular hypertrophy. Left ventricular diastolic parameters are consistent with Grade II diastolic dysfunction (pseudonormalization). Right Ventricle: The right ventricular size is normal. Mildly increased right ventricular wall thickness. Right ventricular systolic function is normal. Left Atrium: Left atrial size was normal in size. Right Atrium: Right atrial size was normal in size. Pericardium: The pericardium appears mildly thickened. Trivial pericardial effusion is present. There is no evidence of cardiac tamponade. Mitral Valve: The mitral valve was not well visualized. Trivial mitral valve regurgitation. No evidence of mitral valve stenosis. MV peak gradient, 2.0 mmHg. The mean mitral valve gradient is 1.0 mmHg. Tricuspid Valve: The tricuspid valve is not well visualized. Tricuspid valve regurgitation is not demonstrated. Aortic Valve: The aortic valve has an indeterminant number of cusps. Aortic valve regurgitation is mild. No aortic stenosis is present. Aortic valve mean gradient measures 2.0 mmHg. Aortic valve peak gradient measures 4.3 mmHg. Aortic valve area, by VTI measures 4.29 cm. Pulmonic Valve: The pulmonic valve was not well visualized. Pulmonic valve regurgitation is not visualized. No evidence of pulmonic stenosis. Aorta: Aortic dilatation noted. There is  mild dilatation of the aortic root, measuring 40 mm. There is mild dilatation of the ascending aorta, measuring 36 mm. Pulmonary Artery: The pulmonary artery is not well seen. Venous: The inferior vena cava is normal in size with greater than 50% respiratory variability, suggesting right atrial pressure of 3 mmHg. IAS/Shunts: The interatrial septum was not well visualized. Additional Comments: There is a small pleural effusion in the left lateral region.  LEFT VENTRICLE PLAX 2D LVIDd:         5.12 cm   Diastology LVIDs:         3.84 cm   LV e' medial:    6.74 cm/s LV PW:         0.96 cm   LV E/e' medial:  8.5 LV IVS:        0.77 cm   LV e' lateral:   10.00 cm/s LVOT diam:     2.00 cm   LV E/e' lateral: 5.7 LV SV:         53 LV SV Index:   29 LVOT Area:     3.14 cm  RIGHT VENTRICLE RV Basal diam:  3.38 cm LEFT ATRIUM             Index        RIGHT ATRIUM           Index LA diam:        3.90 cm 2.15 cm/m   RA Area:     17.60 cm LA Vol (A2C):   23.7 ml 13.06 ml/m  RA Volume:   47.40 ml  26.12 ml/m LA Vol (A4C):   26.1 ml 14.38 ml/m LA Biplane Vol: 26.9 ml 14.82 ml/m  AORTIC VALVE                    PULMONIC VALVE AV Area (Vmax):    3.32 cm     PV Vmax:       1.00 m/s AV Area (Vmean):   3.29 cm  PV Vmean:      67.600 cm/s AV Area (VTI):     4.29 cm     PV VTI:        0.135 m AV Vmax:           104.00 cm/s  PV Peak grad:  4.0 mmHg AV Vmean:          67.800 cm/s  PV Mean grad:  2.0 mmHg AV VTI:            0.123 m AV Peak Grad:      4.3 mmHg AV Mean Grad:      2.0 mmHg LVOT Vmax:         110.00 cm/s LVOT Vmean:        70.900 cm/s LVOT VTI:          0.168 m LVOT/AV VTI ratio: 1.37  AORTA Ao Root diam: 4.00 cm MITRAL VALVE MV Area (PHT): 5.79 cm    SHUNTS MV Area VTI:   2.95 cm    Systemic VTI:  0.17 m MV Peak grad:  2.0 mmHg    Systemic Diam: 2.00 cm MV Mean grad:  1.0 mmHg MV Vmax:       0.70 m/s MV Vmean:      47.9 cm/s MV Decel Time: 131 msec MV E velocity: 57.40 cm/s MV A velocity: 51.00 cm/s MV E/A  ratio:  1.13 Harrell Gave End MD Electronically signed by [name] MD Signature Date/Time: 01/17/2022/10:51:23 AM    Final       ASSESSMENT/PLAN   Acute hypoxemic respiratory failure -Due to bilateral pleural effusions and associated compressive atelectasis. -he has albuminemia and diastolic heart failure stage 2. -he also has atelectasis and would benefit from incentive spirometry  -mild leukocytosis with negative procalcitonin and normal lactate.   -RVP ordered   Tachyarrythmia -his pulse was with skipped beats during physical examinatioI suspect PVCs sp EKG -cardiology on case appreciate input   Tobacco smoking  - patient actively smoking  - counseling provided for smoking cessation   Mild protein calorie malnutrition  Albumin 3.0  There is bitermporal wasting   - suspect hypoalbuminemia and poor nutrition are contributing to third spaced fluid -RD nutritional evaluation      Thank you for allowing me to participate in the care of this patient.   Patient/Family are satisfied with care plan and all questions have been answered.  This document was prepared using Dragon voice recognition software and may include unintentional dictation errors.     Ottie Glazier, M.D.  Division of [place]

## 2022-01-23 ENCOUNTER — Telehealth: Payer: Self-pay

## 2022-01-23 NOTE — Telephone Encounter (Signed)
Transition Care Management Follow-up Telephone Call Date of discharge and from where: 01/19/22 [facility] How have you been since you were released from the hospital? Pt states he is doing okay Any questions or concerns? No  Items Reviewed: Did the pt receive and understand the discharge instructions provided? Yes  Medications obtained and verified? Yes  Other? No  Any new allergies since your discharge? No  Dietary orders reviewed? Yes Do you have support at home? Yes   Home Care and Equipment/Supplies: Were home health services ordered? no  Were any new equipment or medical supplies ordered?  No  Functional Questionnaire: (I = Independent and D = Dependent) ADLs: I  Bathing/Dressing- I  Meal Prep- I  Eating- I  Maintaining continence- I  Transferring/Ambulation- I  Managing Meds- I  Follow up appointments reviewed:  PCP Hospital f/u appt confirmed? Yes  Scheduled to see Dr. Rosana Berger on 01/31/22. [facility] f/u appt confirmed? Yes  Scheduled to see Dr. Rogue Bussing on 01/24/22. Are transportation arrangements needed? No  If their condition worsens, is the pt aware to call PCP or go to the Emergency Dept.? Yes Was the patient provided with contact information for the PCP's office or ED? Yes Was to pt encouraged to call back with questions or concerns? Yes

## 2022-01-24 ENCOUNTER — Inpatient Hospital Stay: Payer: Medicare PPO | Attending: Internal Medicine | Admitting: Internal Medicine

## 2022-01-24 ENCOUNTER — Inpatient Hospital Stay: Payer: Medicare PPO

## 2022-01-24 ENCOUNTER — Other Ambulatory Visit (HOSPITAL_COMMUNITY): Payer: Self-pay

## 2022-01-24 ENCOUNTER — Other Ambulatory Visit: Payer: Self-pay

## 2022-01-24 DIAGNOSIS — R5383 Other fatigue: Secondary | ICD-10-CM | POA: Diagnosis not present

## 2022-01-24 DIAGNOSIS — Z8261 Family history of arthritis: Secondary | ICD-10-CM | POA: Insufficient documentation

## 2022-01-24 DIAGNOSIS — Z8249 Family history of ischemic heart disease and other diseases of the circulatory system: Secondary | ICD-10-CM | POA: Diagnosis not present

## 2022-01-24 DIAGNOSIS — Z95828 Presence of other vascular implants and grafts: Secondary | ICD-10-CM

## 2022-01-24 DIAGNOSIS — Z7901 Long term (current) use of anticoagulants: Secondary | ICD-10-CM | POA: Insufficient documentation

## 2022-01-24 DIAGNOSIS — R0602 Shortness of breath: Secondary | ICD-10-CM | POA: Diagnosis not present

## 2022-01-24 DIAGNOSIS — I2510 Atherosclerotic heart disease of native coronary artery without angina pectoris: Secondary | ICD-10-CM | POA: Insufficient documentation

## 2022-01-24 DIAGNOSIS — Z9049 Acquired absence of other specified parts of digestive tract: Secondary | ICD-10-CM | POA: Insufficient documentation

## 2022-01-24 DIAGNOSIS — Z79899 Other long term (current) drug therapy: Secondary | ICD-10-CM | POA: Insufficient documentation

## 2022-01-24 DIAGNOSIS — J9600 Acute respiratory failure, unspecified whether with hypoxia or hypercapnia: Secondary | ICD-10-CM | POA: Insufficient documentation

## 2022-01-24 DIAGNOSIS — C9110 Chronic lymphocytic leukemia of B-cell type not having achieved remission: Secondary | ICD-10-CM | POA: Insufficient documentation

## 2022-01-24 DIAGNOSIS — Z86711 Personal history of pulmonary embolism: Secondary | ICD-10-CM | POA: Diagnosis not present

## 2022-01-24 DIAGNOSIS — Z818 Family history of other mental and behavioral disorders: Secondary | ICD-10-CM | POA: Diagnosis not present

## 2022-01-24 DIAGNOSIS — D8489 Other immunodeficiencies: Secondary | ICD-10-CM | POA: Diagnosis not present

## 2022-01-24 DIAGNOSIS — F17210 Nicotine dependence, cigarettes, uncomplicated: Secondary | ICD-10-CM | POA: Insufficient documentation

## 2022-01-24 DIAGNOSIS — E785 Hyperlipidemia, unspecified: Secondary | ICD-10-CM | POA: Insufficient documentation

## 2022-01-24 LAB — CBC WITH DIFFERENTIAL/PLATELET
Abs Immature Granulocytes: 0.14 10*3/uL — ABNORMAL HIGH (ref 0.00–0.07)
Basophils Absolute: 0.1 10*3/uL (ref 0.0–0.1)
Basophils Relative: 1 %
Eosinophils Absolute: 0 10*3/uL (ref 0.0–0.5)
Eosinophils Relative: 0 %
HCT: 44.9 % (ref 39.0–52.0)
Hemoglobin: 15.2 g/dL (ref 13.0–17.0)
Immature Granulocytes: 2 %
Lymphocytes Relative: 20 %
Lymphs Abs: 1.8 10*3/uL (ref 0.7–4.0)
MCH: 29.7 pg (ref 26.0–34.0)
MCHC: 33.9 g/dL (ref 30.0–36.0)
MCV: 87.9 fL (ref 80.0–100.0)
Monocytes Absolute: 0.8 10*3/uL (ref 0.1–1.0)
Monocytes Relative: 9 %
Neutro Abs: 6.3 10*3/uL (ref 1.7–7.7)
Neutrophils Relative %: 68 %
Platelets: 576 10*3/uL — ABNORMAL HIGH (ref 150–400)
RBC: 5.11 MIL/uL (ref 4.22–5.81)
RDW: 13.7 % (ref 11.5–15.5)
WBC: 9.2 10*3/uL (ref 4.0–10.5)
nRBC: 0 % (ref 0.0–0.2)

## 2022-01-24 LAB — COMPREHENSIVE METABOLIC PANEL
ALT: 24 U/L (ref 0–44)
AST: 23 U/L (ref 15–41)
Albumin: 3.6 g/dL (ref 3.5–5.0)
Alkaline Phosphatase: 79 U/L (ref 38–126)
Anion gap: 11 (ref 5–15)
BUN: 13 mg/dL (ref 8–23)
CO2: 24 mmol/L (ref 22–32)
Calcium: 9.9 mg/dL (ref 8.9–10.3)
Chloride: 101 mmol/L (ref 98–111)
Creatinine, Ser: 0.77 mg/dL (ref 0.61–1.24)
GFR, Estimated: >60 mL/min (ref >60)
Glucose, Bld: 98 mg/dL (ref 70–99)
Potassium: 4.2 mmol/L (ref 3.5–5.1)
Sodium: 136 mmol/L (ref 135–145)
Total Bilirubin: 0.1 mg/dL — ABNORMAL LOW (ref 0.3–1.2)
Total Protein: 7.1 g/dL (ref 6.5–8.1)

## 2022-01-24 LAB — LACTATE DEHYDROGENASE: LDH: 137 U/L (ref 98–192)

## 2022-01-24 MED ORDER — SODIUM CHLORIDE 0.9% FLUSH
10.0000 mL | INTRAVENOUS | Status: DC | PRN
  Administered 2022-01-24: 10 mL via INTRAVENOUS
  Filled 2022-01-24: qty 10

## 2022-01-24 MED ORDER — HEPARIN SOD (PORK) LOCK FLUSH 100 UNIT/ML IV SOLN
500.0000 [IU] | Freq: Once | INTRAVENOUS | Status: AC
  Administered 2022-01-24: 500 [IU] via INTRAVENOUS
  Filled 2022-01-24: qty 5

## 2022-01-24 NOTE — Assessment & Plan Note (Addendum)
#   SLL/CLL-  currently on  Gazyva x 6 cycles + venatoclax x68m However therapy interrupted-because of pneumonia/pericarditis [see below]. # s/p cycle #6 of Gazyva; Currently on venatoclax; recommend holding venetoclax at this time see discussion below  # Given the overall stability of the disease in the context of-multiple hospitalization [?  Concern for medication induced side effects]-I think is reasonable to hold current therapy-including venetoclax.  We will reimage in the next few months.  # Repeated acute respiratory failure-2022 pericardial effusion/pericarditis; vs FEB 2023-currently etiology.  Discussed medication induced is extremely rare however not ruled out.  The other etiology is-immunosuppression/possible undiagnosed infections.  We will check quantitative immunoglobulins.   # PE acute [NOV 22426]provoked--currently on Eliquis. Continue  Eliquis x 6 months or so; will again review at next visit..  # Continue  shingles/TLS prophylaxis: Acyclovir-STABLE.  # Mediport placement: No malfunction noted- keep flushing.   # DISPOSITION: # follow up in 1 month - MD; labs- cbc/cmp/LDH; port flush;--Dr.B

## 2022-01-24 NOTE — Progress Notes (Signed)
Patient denies new problems/concerns today.  Did have hospital admission since last office visit.

## 2022-01-24 NOTE — Progress Notes (Signed)
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   AUG 2021-Head and neck: The brain demonstrates symmetric appearing diffuse physiologic uptake without focal finding to suggest abnormality. In the left posterior and medial maxillary cavity, there is an area of mucoid deposition seen within SUV max of 28.8. This was seen in the previous study of November. The specific bony erosion, clear-cut soft tissue lesion is not detected. This probably represents a chronic sinus disease focus. The bilateral optic nerves and extraocular muscles to the largest nodes are seen on axial image 98. 1 now demonstrates a short axis measurement of 3.3 cm and the other more medially, a short axis measurement of 2.5 cm. Demonstrate symmetric increased uptake. The left, much greater than right, jugulodigastric and supraclavicular nodes have increased significantly in size and avidity since the previous study. Both of these lymph nodes demonstrate an SUV max of 3.9. No largest lymph node in the right subclavicular space is seen on axial image 104, demonstrating an SUV max of 3.3, and a short axis measurement of 1.4 cm.   On axial image 102, a right paratracheal lymph node is again identified with increased avidity,  located immediately posterior to the right thyroid pole. This area was noted previously, but now increased avidity is seen, with an SUV max of 8.6 in this area. Multiple smaller, and less avid lymph nodes scattered throughout the left greater than right neck are seen, as well as supraclavicular and infraclavicular areas.   Thorax: Significant axillary lymph nodes have increased significantly in size. These are also left greater than right, with the largest on the left seen on image #114 with a short axis measurement of 2.2 cm and an SUV max of 4.3. The largest on the right as seen on image 111, demonstrating a short axis of 1.4 cm and an SUV max of 4.0. Too numerous to count smaller and similarly avid lymph nodes in both axillae are also noted. These are all larger than previously seen and in general, more avid.  Numerous small AP window and lesser mediastinal lymph nodes are seen with minimal increased avidity, appearing similar in size and avidity to the previous study. No other significant mediastinal adenopathy. No hilar adenopathy detected. These likely also represent lymphoproliferative disease.  Hyperinflation of COPD, bibasilar dependent atelectasis, some paucity of lung tissue and biapical scarring are noted without other focal pulmonary mass or nodule, or acute pulmonary parenchymal disease. Physiologic uptake in the left heart is seen, as expected. Small hiatal hernia is again identified.   Abdomen pelvis: Physiologic uptake in the liver, gut and urinary tract is again noted. No focal area of asymmetric uptake in the structures is seen. The spleen also demonstrates generalized increased uptake and stable splenomegaly, up to 14.2 cm. On image 198, a preaortic lymph node is detected of increased size since the previous study, now measuring  up to 1.2 cm short axis.  Several large lymph nodes on image 203 of the mesentery are noted. The largest of these, on image 201 and ureters up to 1.8 cm short  axis. Despite the fact that measurable avidity is not detected in these are the surrounding lymph nodes, these are doubtless involved in the lymphoproliferative disease described, and appear larger. Multiple smaller lymph nodes scattered throughout the mesentery are also seen.  Grossly enlarged right greater than left common iliac chain lymph nodes are again seen, throughout the pelvis and into the inguinal areas bilaterally. The most avid on the right is seen on image #260 to, demonstrates a short axis of 2.5 cm, and an SUV max of 4.9. The largest on the left is identified image #272, demonstrates a short axis of 2.0 cm, and an SUV max of 4.7. These are similar to the previously noted values.  Right greater than left inguinal adenopathy is seen, with the largest lymph node seen on the right image 276, demonstrating a short axis of 1.5 cm and an SUV max of 3.9. This is larger in size and avidity than previously seen. There are bilateral enlarged lymph nodes associated with the external iliac vessels on image 277. These measure 2.0 cm on the right in short axis, with an SUV max of 3.9.  On the left, image 278, the external iliac lymph node is larger at 1.8 cm short axis, but with an SUV max of only 3.8. Lesser avid and smaller lymph nodes on both sides are also detected.   Musculoskeletal: Scattered, insignificant uptake is detected in the muscles especially in the bilateral joints, without focal finding to suggest lymphoproliferative disease.   IMPRESSIONS:  Significant increase in numbers, short axis size, and to a large extent, avidity -in the lymph nodes of the left greater than right neck, supraclavicular and infraclavicular areas, bilateral axillary lymph nodes and common iliac chain, external iliac chain and inguinal lymph nodes, consistent with worsening lymphoproliferative disease. The mediastinal lymph nodes seen are still present and unchanged in size or [significantly] in avidity.    [place]-  Bx- SLL/CLL.July 2022-Discussed with Dr. Asquith-oncologist, [place]-reviewed the pathology of the lymph node biopsy in 2021-positive for SLL; negative for cyclin D1/1114 translocation.    # July 20th, 2022- GAZYVA #1 Aletha Halim plan ading VENATOCLAX after 2 cycles/de-bulking] s/p GAYZYVA [x6]- Feb 2023 - cycle #1  Venatoclax- x1 month- HELD sec to repeated hospitalization  # December 2022-pericarditis/effusion needing pericardiocentesis; FEB 2023-acute respiratory failure-hospitalization-? Etiology [s/p ID; Pul; Dr.A; cards- [facility]]  # Lung cancer screening- on [facility]  # SURVIVORSHIP:   # GENETICS:   DIAGNOSIS:   STAGE:         ;  GOALS:  CURRENT/MOST RECENT THERAPY :       CLL (chronic lymphocytic leukemia) ([place])  10/29/2019 Initial Diagnosis   CLL (chronic lymphocytic leukemia) ([place])   06/15/2021 -  Chemotherapy   Patient is on Treatment Plan : LYMPHOMA CLL/SLL Venetoclax + Obinutuzumab q28d        INTERVAL HISTORY: Ambulating independently.  Accompanied by his wife  A pleasant 71 year-old male patient with CLL/SLL -stage IV bulky adenopathy-currently s/p cycle #6-+ venatoclax [100 mg/day-cycle #1].  Patient was recently admitted to the hospital for worsening shortness of breath.  CTA-no evidence of any PE; patient already on Eliquis.  Noted to bilateral atelectasis/mild effusions.  Patient s/p IV diuresis.  Status post extensive evaluation with ID; pulmonary; cardiology.  Patient denies any worsening leg swelling.  Denies  any new lumps or bumps.  Appetite is good.  No weight loss.     Review of Systems  Constitutional:  Positive for malaise/fatigue. Negative for chills, diaphoresis, fever and weight loss.  HENT:  Negative for nosebleeds and sore throat.   Eyes:  Negative for double vision.  Respiratory:  Negative for cough, hemoptysis, sputum production, shortness of breath and wheezing.   Cardiovascular:  Negative for chest pain, palpitations, orthopnea and leg swelling.   Gastrointestinal:  Negative for abdominal pain, blood in stool, constipation, diarrhea, heartburn, melena, nausea and vomiting.  Genitourinary:  Negative for dysuria, frequency and urgency.  Musculoskeletal:  Negative for back pain and joint pain.  Skin: Negative.  Negative for itching and rash.  Neurological:  Negative for dizziness, tingling, focal weakness, weakness and headaches.  Endo/Heme/Allergies:  Does not bruise/bleed easily.  Psychiatric/Behavioral:  Negative for depression. The patient is not nervous/anxious and does not have insomnia.     PAST MEDICAL HISTORY :  Past Medical History:  Diagnosis Date   Atherosclerosis of coronary artery 09/28/2016   Chest CT Nov 2017   Atrial tachycardia ([facility])    CLL (chronic lymphocytic leukemia) ([facility])    Elevated C-reactive protein (CRP)    Elevated PSA    Fractured sternum 1999   Herpes    History of rib fracture 1999   multiple   Hyperlipidemia    Psoriasis    Pulmonary embolism ([place])    Rheumatoid factor positive 02/2014   Rosacea    Thoracic aortic atherosclerosis ([place]) 09/28/2016   Chest CT Nov 2017   Tobacco abuse    Vitamin D deficiency disease     PAST SURGICAL HISTORY :   Past Surgical History:  Procedure Laterality Date   APPENDECTOMY  1974   PERICARDIOCENTESIS N/A 10/03/2021   Procedure: PERICARDIOCENTESIS;  Surgeon: Jettie Booze, MD;  Location: [facility];  Service: Cardiovascular;  Laterality: N/A;   PORTA CATH INSERTION N/A 06/06/2021   Procedure: PORTA CATH INSERTION;  Surgeon: Algernon Huxley, MD;  Location: [facility];  Service: Cardiovascular;  Laterality: N/A;   SHOULDER ARTHROSCOPY  10/23/2016   Procedure: ARTHROSCOPY SHOULDER WITH DISTAL CLAVICLE EXCISION, PARTIAL ACROMIONECTOMY, AND DEBRIDEMENT;  Surgeon: Earnestine Leys, MD;  Location: [facility];  Service: Orthopedics;;   TIBIA FRACTURE SURGERY Left 2004   titanium rod, ORIF    FAMILY HISTORY :   Family History  Problem Relation  Age of Onset   Heart disease Father    Osteoarthritis Father    Alzheimer's disease Father    Cancer Neg Hx    Diabetes Neg Hx    Hypertension Neg Hx    Stroke Neg Hx    COPD Neg Hx     SOCIAL HISTORY:   Social History   Tobacco Use   Smoking status: Every Day    Packs/day: 0.50    Years: 45.00    Pack years: 22.50    Types: Cigarettes   Smokeless tobacco: Never   Tobacco comments:    11/6- currently smoking 5 cigarettes a day   Vaping Use   Vaping Use: Never used  Substance Use Topics   Alcohol use: Yes    Alcohol/week: 3.0 standard drinks    Types: 3 Glasses of wine per week    Comment: a couple glasses of wine a week   Drug use: No    ALLERGIES:  is allergic to gluten meal.  MEDICATIONS:  Current Outpatient Medications  Medication Sig Dispense Refill  acyclovir (ZOVIRAX) 400 MG tablet Take 1 tablet (400 mg total) by mouth 2 (two) times daily. 180 tablet 1   apixaban (ELIQUIS) 5 MG TABS tablet Take 1 tablet (5 mg total) by mouth 2 (two) times daily. 60 tablet 5   cholecalciferol (VITAMIN D3) 25 MCG (1000 UNIT) tablet Take 1,000 Units by mouth daily.     hydrocortisone valerate cream (WESTCORT) 0.2 % Apply 1 application topically 2 (two) times daily. 45 g 0   lidocaine-prilocaine (EMLA) cream Apply 30 -45 mins prior to port access. 30 g 0   oxyCODONE (OXY IR/ROXICODONE) 5 MG immediate release tablet Take 1 tablet (5 mg total) by mouth every 6 (six) hours as needed for moderate pain or severe pain. 20 tablet 0   venetoclax (VENCLEXTA) 100 MG tablet Take 4 tablets (400 mg total) by mouth daily. Take 200 mg once a day x 1 week; and then 455m once a day. Tablets should be swallowed whole with a meal and a full glass of water. (Patient not taking: Reported on 01/24/2022) 120 tablet 1   No current facility-administered medications for this visit.    PHYSICAL EXAMINATION: ECOG PERFORMANCE STATUS: 0 - Asymptomatic  BP 100/80    Pulse (!) 108    Temp (!) 96.8 F (36 C)     Resp 18    Wt 161 lb 3.2 oz (73.1 kg)    SpO2 100%    BMI 26.02 kg/m   Filed Weights   01/24/22 1000  Weight: 161 lb 3.2 oz (73.1 kg)    No significant lymphadenopathy lymphadenopathy in the neck/underarm.  Physical Exam HENT:     Head: Normocephalic and atraumatic.     Mouth/Throat:     Pharynx: No oropharyngeal exudate.  Eyes:     Pupils: Pupils are equal, round, and reactive to light.  Cardiovascular:     Rate and Rhythm: Normal rate and regular rhythm.  Pulmonary:     Effort: No respiratory distress.     Breath sounds: No wheezing.  Abdominal:     General: Bowel sounds are normal. There is no distension.     Palpations: Abdomen is soft. There is no mass.     Tenderness: There is no abdominal tenderness. There is no guarding or rebound.  Musculoskeletal:        General: No tenderness. Normal range of motion.     Cervical back: Normal range of motion and neck supple.  Skin:    General: Skin is warm.  Neurological:     Mental Status: He is alert and oriented to person, place, and time.  Psychiatric:        Mood and Affect: Affect normal.    LABORATORY DATA:  I have reviewed the data as listed    Component Value Date/Time   NA 136 01/24/2022 0939   NA 139 10/27/2021 0857   K 4.2 01/24/2022 0939   CL 101 01/24/2022 0939   CO2 24 01/24/2022 0939   GLUCOSE 98 01/24/2022 0939   BUN 13 01/24/2022 0939   BUN 16 10/27/2021 0857   CREATININE 0.77 01/24/2022 0939   CREATININE 0.84 10/06/2019 0000   CALCIUM 9.9 01/24/2022 0939   PROT 7.1 01/24/2022 0939   ALBUMIN 3.6 01/24/2022 0939   AST 23 01/24/2022 0939   ALT 24 01/24/2022 0939   ALKPHOS 79 01/24/2022 0939   BILITOT 0.1 (L) 01/24/2022 0939   GFRNONAA >60 01/24/2022 0939   GFRNONAA 91 10/06/2019 0000   GFRAA >60 12/26/2019 04944  GFRAA 106 10/06/2019 0000    No results found for: SPEP, UPEP  Lab Results  Component Value Date   WBC 9.2 01/24/2022   NEUTROABS 6.3 01/24/2022   HGB 15.2 01/24/2022   HCT  44.9 01/24/2022   MCV 87.9 01/24/2022   PLT 576 (H) 01/24/2022      Chemistry      Component Value Date/Time   NA 136 01/24/2022 0939   NA 139 10/27/2021 0857   K 4.2 01/24/2022 0939   CL 101 01/24/2022 0939   CO2 24 01/24/2022 0939   BUN 13 01/24/2022 0939   BUN 16 10/27/2021 0857   CREATININE 0.77 01/24/2022 0939   CREATININE 0.84 10/06/2019 0000      Component Value Date/Time   CALCIUM 9.9 01/24/2022 0939   ALKPHOS 79 01/24/2022 0939   AST 23 01/24/2022 0939   ALT 24 01/24/2022 0939   BILITOT 0.1 (L) 01/24/2022 0939       RADIOGRAPHIC STUDIES: I have personally reviewed the radiological images as listed and agreed with the findings in the report. No results found.   ASSESSMENT & PLAN:   .CLL (chronic lymphocytic leukemia) ([place]) # SLL/CLL-  currently on  Gazyva x 6 cycles + venatoclax x17m However therapy interrupted-because of pneumonia/pericarditis [see below]. # s/p cycle #6 of Gazyva; Currently on venatoclax; recommend holding venetoclax at this time see discussion below  # Given the overall stability of the disease in the context of-multiple hospitalization [?  Concern for medication induced side effects]-I think is reasonable to hold current therapy-including venetoclax.  We will reimage in the next few months.  # Repeated acute respiratory failure-2022 pericardial effusion/pericarditis; vs FEB 2023-currently etiology.  Discussed medication induced is extremely rare however not ruled out.  The other etiology is-immunosuppression/possible undiagnosed infections.  We will check quantitative immunoglobulins.   # PE acute [NOV 29629]provoked--currently on Eliquis. Continue  Eliquis x 6 months or so; will again review at next visit..  # Continue  shingles/TLS prophylaxis: Acyclovir-STABLE.  # Mediport placement: No malfunction noted- keep flushing.   # DISPOSITION: # follow up in 1 month - MD; labs- cbc/cmp/LDH; port flush;--Dr.B            Orders Placed  This Encounter  Procedures   Immunoglobulins, QN, A/E/G/M    Standing Status:   Future    Standing Expiration Date:   01/24/2023   CBC with Differential/Platelet    Standing Status:   Future    Standing Expiration Date:   01/24/2023   Comprehensive metabolic panel    Standing Status:   Future    Standing Expiration Date:   01/24/2023   Lactate dehydrogenase    Standing Status:   Future    Standing Expiration Date:   01/24/2023            GCammie Sickle MD 01/24/2022 12:24 PM

## 2022-01-26 ENCOUNTER — Other Ambulatory Visit (HOSPITAL_COMMUNITY): Payer: Self-pay

## 2022-01-26 ENCOUNTER — Ambulatory Visit: Payer: TRICARE For Life (TFL) | Admitting: Family

## 2022-01-27 ENCOUNTER — Other Ambulatory Visit (HOSPITAL_COMMUNITY): Payer: Self-pay

## 2022-01-30 ENCOUNTER — Other Ambulatory Visit: Payer: Self-pay | Admitting: Physician Assistant

## 2022-01-30 DIAGNOSIS — L409 Psoriasis, unspecified: Secondary | ICD-10-CM

## 2022-01-31 ENCOUNTER — Encounter: Payer: Self-pay | Admitting: Internal Medicine

## 2022-01-31 ENCOUNTER — Ambulatory Visit (INDEPENDENT_AMBULATORY_CARE_PROVIDER_SITE_OTHER): Payer: Medicare PPO | Admitting: Internal Medicine

## 2022-01-31 ENCOUNTER — Other Ambulatory Visit: Payer: Self-pay

## 2022-01-31 VITALS — BP 116/82 | HR 118 | Temp 97.5°F | Resp 16 | Ht 66.0 in | Wt 161.9 lb

## 2022-01-31 DIAGNOSIS — R0602 Shortness of breath: Secondary | ICD-10-CM | POA: Diagnosis not present

## 2022-01-31 DIAGNOSIS — J90 Pleural effusion, not elsewhere classified: Secondary | ICD-10-CM | POA: Diagnosis not present

## 2022-01-31 DIAGNOSIS — Z09 Encounter for follow-up examination after completed treatment for conditions other than malignant neoplasm: Secondary | ICD-10-CM

## 2022-01-31 NOTE — Progress Notes (Signed)
Established Patient Office Visit  Subjective:  Patient ID: Andres Mullins, male    DOB: 1951/09/06  Age: 71 y.o. MRN: [id number]  CC:  Chief Complaint  Patient presents with   Hospitalization Follow-up    Dyspnea and SOB    HPI Andres Mullins presents for hospital follow up. Chronic medical conditions include hx of PE currently on Eliquis, CLL, acute diastolic HF, cardiac tamponade in November, 2022.  Discharge Date: 01/19/22 Hospital/facility: [facility] Diagnosis: acute hypoxic respiratory failure  Procedures/tests: CT scan shows bilateral pleural effusion, L>R, echo showing EF of 55 to 60% and grade 2 diastolic dysfunction Consultants: ID, Pulmonology, Oncology  Discontinued medications: colchicine, Singulair, pantoprazole  Status: better, feeling weak but slowly getting better   Presented to the ER with shortness of breath and found to have bilateral pleural effusion. US showed minimal fluid that could not be drained. Infectious disease recommended QuantiFERON gold test and work-up for vasculitis which was negative.  Echocardiogram showed EF 55 to 60% with grade 2 diastolic dysfunction. Patient had been cleared from pulmonology and infectious disease to be discharged. He was on 2 L [place] initially but weaned down to room air.   Today he states he is doing much better. He has no shortness of breath, cough, wheezing, fevers, hot flashes. He has his appetite back and is eating more protein in his diet. He is having good bowel movements. 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 Currently has an eczema flare on his right LE currently, using hydrocortisone cream. Using Metro gel for rosea.   Still smoking but trying to quit, currently smoking 2-3 cigarettes a day. Started smoking when he was 71 years old.  Following with Oncology, [place] recently held with plans to re-image in a few months.    Past Medical History:  Diagnosis Date    Atherosclerosis of coronary artery 09/28/2016   Chest CT Nov 2017   Atrial tachycardia (HCC)    CLL (chronic lymphocytic leukemia) (HCC)    Elevated C-reactive protein (CRP)    Elevated PSA    Fractured sternum 1999   Herpes    History of rib fracture 1999   multiple   Hyperlipidemia    Psoriasis    Pulmonary embolism ([place])    Rheumatoid factor positive 02/2014   Rosacea    Thoracic aortic atherosclerosis ([place]) 09/28/2016   Chest CT Nov 2017   Tobacco abuse    Vitamin D deficiency disease     Past Surgical History:  Procedure Laterality Date   APPENDECTOMY  1974   PERICARDIOCENTESIS N/A 10/03/2021   Procedure: PERICARDIOCENTESIS;  Surgeon: Jettie Booze, MD;  Location: [facility];  Service: Cardiovascular;  Laterality: N/A;   PORTA CATH INSERTION N/A 06/06/2021   Procedure: PORTA CATH INSERTION;  Surgeon: Algernon Huxley, MD;  Location: [facility];  Service: Cardiovascular;  Laterality: N/A;   SHOULDER ARTHROSCOPY  10/23/2016   Procedure: ARTHROSCOPY SHOULDER WITH DISTAL CLAVICLE EXCISION, PARTIAL ACROMIONECTOMY, AND DEBRIDEMENT;  Surgeon: Earnestine Leys, MD;  Location: [facility];  Service: Orthopedics;;   TIBIA FRACTURE SURGERY Left 2004   titanium rod, ORIF    Family History  Problem Relation Age of Onset   Heart disease Father    Osteoarthritis Father    Alzheimer's disease Father    Cancer Neg Hx    Diabetes Neg Hx    Hypertension Neg Hx    Stroke Neg Hx    COPD Neg  Hx     Social History   Socioeconomic History   Marital status: Married    Spouse name: colleen   Number of children: Not on file   Years of education: 12   Highest education level: Bachelor's degree (e.g., BA, AB, BS)  Occupational History   Occupation: retired  Tobacco Use   Smoking status: Every Day    Packs/day: 0.50    Years: 45.00    Pack years: 22.50    Types: Cigarettes   Smokeless tobacco: Never   Tobacco comments:    11/6- currently smoking 5 cigarettes a day    Vaping Use   Vaping Use: Never used  Substance and Sexual Activity   Alcohol use: Yes    Alcohol/week: 3.0 standard drinks    Types: 3 Glasses of wine per week    Comment: a couple glasses of wine a week   Drug use: No   Sexual activity: Yes  Other Topics Concern   Not on file  Social History Narrative   Consumes ~4 cups of coffee/day   Social Determinants of Health   Financial Resource Strain: Not on file  Food Insecurity: Not on file  Transportation Needs: Not on file  Physical Activity: Not on file  Stress: Not on file  Social Connections: Not on file  Intimate Partner Violence: Not on file    Outpatient Medications Prior to Visit  Medication Sig Dispense Refill   acyclovir (ZOVIRAX) 400 MG tablet Take 1 tablet (400 mg total) by mouth 2 (two) times daily. 180 tablet 1   apixaban (ELIQUIS) 5 MG TABS tablet Take 1 tablet (5 mg total) by mouth 2 (two) times daily. 60 tablet 5   cholecalciferol (VITAMIN D3) 25 MCG (1000 UNIT) tablet Take 1,000 Units by mouth daily.     hydrocortisone valerate cream (WESTCORT) 0.2 % Apply 1 application topically 2 (two) times daily. 45 g 0   lidocaine-prilocaine (EMLA) cream Apply 30 -45 mins prior to port access. 30 g 0   oxyCODONE (OXY IR/ROXICODONE) 5 MG immediate release tablet Take 1 tablet (5 mg total) by mouth every 6 (six) hours as needed for moderate pain or severe pain. 20 tablet 0   venetoclax (VENCLEXTA) 100 MG tablet Take 4 tablets (400 mg total) by mouth daily. Take 200 mg once a day x 1 week; and then 454m once a day. Tablets should be swallowed whole with a meal and a full glass of water. (Patient not taking: Reported on 01/24/2022) 120 tablet 1   No facility-administered medications prior to visit.    Allergies  Allergen Reactions   Gluten Meal     Gluten intolerance     ROS Review of Systems  Constitutional:  Positive for activity change. Negative for appetite change, chills, diaphoresis, fever and unexpected weight  change.  Eyes:  Negative for visual disturbance.  Respiratory:  Negative for cough, shortness of breath and wheezing.   Cardiovascular:  Negative for chest pain, palpitations and leg swelling.  Gastrointestinal:  Negative for abdominal pain, blood in stool, constipation, diarrhea, nausea and vomiting.  Genitourinary:  Negative for dysuria and hematuria.  Skin:  Positive for color change.  Neurological:  Positive for weakness. Negative for dizziness, light-headedness and headaches.     Objective:    Physical Exam Constitutional:      Appearance: Normal appearance.  HENT:     Head: Normocephalic and atraumatic.  Eyes:     Conjunctiva/sclera: Conjunctivae normal.  Cardiovascular:     Rate and  Rhythm: Normal rate and regular rhythm.     Pulses: Normal pulses.     Heart sounds: Normal heart sounds.  Pulmonary:     Effort: Pulmonary effort is normal.     Breath sounds: Normal breath sounds.  Musculoskeletal:     Right lower leg: No edema.     Left lower leg: No edema.  Skin:    General: Skin is warm and dry.  Neurological:     General: No focal deficit present.     Mental Status: He is alert. Mental status is at baseline.  Psychiatric:        Mood and Affect: Mood normal.        Behavior: Behavior normal.    BP 116/82    Pulse (!) 118    Temp (!) 97.5 F (36.4 C)    Resp 16    Ht 5' 6"  (1.676 m)    Wt 161 lb 14.4 oz (73.4 kg)    SpO2 98%    BMI 26.13 kg/m  Wt Readings from Last 3 Encounters:  01/31/22 161 lb 14.4 oz (73.4 kg)  01/24/22 161 lb 3.2 oz (73.1 kg)  01/19/22 157 lb 3.2 oz (71.3 kg)     Health Maintenance Due  Topic Date Due   TETANUS/TDAP  05/13/2018    There are no preventive care reminders to display for this patient.  Lab Results  Component Value Date   TSH 1.237 10/04/2021   Lab Results  Component Value Date   WBC 9.2 01/24/2022   HGB 15.2 01/24/2022   HCT 44.9 01/24/2022   MCV 87.9 01/24/2022   PLT 576 (H) 01/24/2022   Lab Results   Component Value Date   NA 136 01/24/2022   K 4.2 01/24/2022   CO2 24 01/24/2022   GLUCOSE 98 01/24/2022   BUN 13 01/24/2022   CREATININE 0.77 01/24/2022   BILITOT 0.1 (L) 01/24/2022   ALKPHOS 79 01/24/2022   AST 23 01/24/2022   ALT 24 01/24/2022   PROT 7.1 01/24/2022   ALBUMIN 3.6 01/24/2022   CALCIUM 9.9 01/24/2022   ANIONGAP 11 01/24/2022   EGFR 95 10/27/2021   Lab Results  Component Value Date   CHOL 194 10/06/2019   Lab Results  Component Value Date   HDL 45 10/06/2019   Lab Results  Component Value Date   LDLCALC 124 (H) 10/06/2019   Lab Results  Component Value Date   TRIG 136 10/06/2019   Lab Results  Component Value Date   CHOLHDL 4.3 10/06/2019   Lab Results  Component Value Date   HGBA1C 5.5 09/07/2021      Assessment & Plan:   1. Shortness of breath/Pleural effusion/Hospital discharge follow-up: Reviewed all hospital/specialist notes, imaging, labs. Patient is doing better clinically, on room air. Still weak but increased protein in diet and working on increasing cardiovascular fitness slowly. Monitoring pulse ox at home, doing well. Offered PT, patient politely declines. Following with Oncology, Cardiology for evaluation. Follow up here in 3 months for recheck or sooner as needed.   Follow-up: Return in about 3 months (around 05/03/2022).    Teodora Medici, DO

## 2022-01-31 NOTE — Telephone Encounter (Signed)
Requested medication (s) are due for refill today:   Provider to review ? ?Requested medication (s) are on the active medication list:   Yes ? ?Future visit scheduled:   Yes   Seen today by Dr. Rosana Berger ? ? ?Last ordered: 12/19/2021 45 g, 0 refills ? ?Returned because this is a non delegated refill  ? ?Requested Prescriptions  ?Pending Prescriptions Disp Refills  ? hydrocortisone valerate cream (WESTCORT) 0.2 % [Pharmacy Med Name: HYDROCORTISONE VAL 0.2% CREAM] 45 g 0  ?  Sig: APPLY TO AFFECTED AREA TWICE A DAY  ?  ? Not Delegated - Dermatology:  Corticosteroids Failed - 01/30/2022  1:02 PM  ?  ?  Failed - This refill cannot be delegated  ?  ?  Passed - Valid encounter within last 12 months  ?  Recent Outpatient Visits   ? ?      ? Today Shortness of breath  ? [place], DO  ? 2 weeks ago Shortness of breath  ? [place], PA-C  ? 1 month ago Vitamin D deficiency disease  ? [place], PA-C  ? 2 years ago Hyperlipidemia, unspecified hyperlipidemia type  ? [facility] [name], PA-C  ? 3 years ago [employer] preventive visit  ? [facility] Lada, Satira Anis, MD  ? ?  ?  ?Future Appointments   ? ?        ? In 1 week Brendolyn Patty, MD [place]  ? In 2 weeks Furth, Cadence H, PA-C [place]  ? In 2 months Dunn, Areta Haber, PA-C [facility], [facility]  ? In 3 months Teodora Medici, [facility], [place]  ? In 4 months Verlon Au, NP [facility]  ? ?  ? ?  ?  ?  ? ?

## 2022-01-31 NOTE — Patient Instructions (Signed)
It was great seeing you today! ? ?Plan discussed at today's visit: ?-Continue all medications and continue to follow up with specialists ? ?Follow up in: 3 months  ? ?Take care and let us know if you have any questions or concerns prior to your next visit. ? ?Dr. Rosana Berger ? ?

## 2022-02-08 ENCOUNTER — Ambulatory Visit (INDEPENDENT_AMBULATORY_CARE_PROVIDER_SITE_OTHER): Payer: Medicare PPO | Admitting: Dermatology

## 2022-02-08 ENCOUNTER — Encounter: Payer: Self-pay | Admitting: Internal Medicine

## 2022-02-08 ENCOUNTER — Other Ambulatory Visit: Payer: Self-pay

## 2022-02-08 DIAGNOSIS — L578 Other skin changes due to chronic exposure to nonionizing radiation: Secondary | ICD-10-CM | POA: Diagnosis not present

## 2022-02-08 DIAGNOSIS — L918 Other hypertrophic disorders of the skin: Secondary | ICD-10-CM | POA: Diagnosis not present

## 2022-02-08 DIAGNOSIS — L820 Inflamed seborrheic keratosis: Secondary | ICD-10-CM | POA: Diagnosis not present

## 2022-02-08 DIAGNOSIS — L409 Psoriasis, unspecified: Secondary | ICD-10-CM

## 2022-02-08 DIAGNOSIS — L821 Other seborrheic keratosis: Secondary | ICD-10-CM | POA: Diagnosis not present

## 2022-02-08 DIAGNOSIS — L719 Rosacea, unspecified: Secondary | ICD-10-CM

## 2022-02-08 DIAGNOSIS — Z1283 Encounter for screening for malignant neoplasm of skin: Secondary | ICD-10-CM | POA: Diagnosis not present

## 2022-02-08 DIAGNOSIS — L570 Actinic keratosis: Secondary | ICD-10-CM | POA: Diagnosis not present

## 2022-02-08 MED ORDER — CLOBETASOL PROPIONATE 0.05 % EX CREA: TOPICAL_CREAM | CUTANEOUS | 2 refills | Status: DC

## 2022-02-08 NOTE — Patient Instructions (Addendum)
Psoriasis ?Start Clobetasol Cream - Apply to affected areas rash on legs once to twice a day until improved. When improved, switch to hydrocortisone valerate 0.2% cream. When clear, switch to moisturizer only.  ? ?Topical steroids (such as triamcinolone, fluocinolone, fluocinonide, mometasone, clobetasol, halobetasol, betamethasone, hydrocortisone) can cause thinning and lightening of the skin if they are used for too long in the same area. Your physician has selected the right strength medicine for your problem and area affected on the body. Please use your medication only as directed by your physician to prevent side effects.  ? ? ?Cryotherapy Aftercare ? ?Wash gently with soap and water everyday.   ?Apply Vaseline and Band-Aid daily until healed.  ? ? ?Seborrheic Keratosis ? ?What causes seborrheic keratoses? ?Seborrheic keratoses are harmless, common skin growths that first appear during adult life.  As time goes by, more growths appear.  Some people may develop a large number of them.  Seborrheic keratoses appear on both covered and uncovered body parts.  They are not caused by sunlight.  The tendency to develop seborrheic keratoses can be inherited.  They vary in color from skin-colored to gray, brown, or even black.  They can be either smooth or have a rough, warty surface.   ?Seborrheic keratoses are superficial and look as if they were stuck on the skin.  Under the microscope this type of keratosis looks like layers upon layers of skin.  That is why at times the top layer may seem to fall off, but the rest of the growth remains and re-grows.   ? ?Treatment ?Seborrheic keratoses do not need to be treated, but can easily be removed in the office.  Seborrheic keratoses often cause symptoms when they rub on clothing or jewelry.  Lesions can be in the way of shaving.  If they become inflamed, they can cause itching, soreness, or burning.  Removal of a seborrheic keratosis can be accomplished by freezing,  burning, or surgery. ?If any spot bleeds, scabs, or grows rapidly, please return to have it checked, as these can be an indication of a skin cancer. ? ?If You Need Anything After Your Visit ? ?If you have any questions or concerns for your doctor, please call our main line at [phone] and press option 4 to reach your doctor's medical assistant. If no one answers, please leave a voicemail as directed and we will return your call as soon as possible. Messages left after 4 pm will be answered the following business day.  ? ?You may also send [place] a message via MyChart. We typically respond to MyChart messages within 1-2 business days. ? ?For prescription refills, please ask your pharmacy to contact our office. Our fax number is [phone]. ? ?If you have an urgent issue when the clinic is closed that cannot wait until the next business day, you can page your doctor at the number below.   ? ?Please note that while we do our best to be available for urgent issues outside of office hours, we are not available 24/7.  ? ?If you have an urgent issue and are unable to reach [place], you may choose to seek medical care at your doctor's office, retail clinic, urgent care center, or emergency room. ? ?If you have a medical emergency, please immediately call 911 or go to the emergency department. ? ?Pager Numbers ? ?- Dr. Nehemiah Massed: [phone] ? ?- Dr. Laurence Ferrari: [phone] ? ?- Dr. Nicole Kindred: [phone] ? ?In the event of inclement weather, please call our main  line at [phone] for an update on the status of any delays or closures. ? ?Dermatology Medication Tips: ?Please keep the boxes that topical medications come in in order to help keep track of the instructions about where and how to use these. Pharmacies typically print the medication instructions only on the boxes and not directly on the medication tubes.  ? ?If your medication is too expensive, please contact our office at [phone] option 4 or send [place] a message  through [place].  ? ?We are unable to tell what your co-pay for medications will be in advance as this is different depending on your insurance coverage. However, we may be able to find a substitute medication at lower cost or fill out paperwork to get insurance to cover a needed medication.  ? ?If a prior authorization is required to get your medication covered by your insurance company, please allow [place] 1-2 business days to complete this process. ? ?Drug prices often vary depending on where the prescription is filled and some pharmacies may offer cheaper prices. ? ?The website www.goodrx.com contains coupons for medications through different pharmacies. The prices here do not account for what the cost may be with help from insurance (it may be cheaper with your insurance), but the website can give you the price if you did not use any insurance.  ?- You can print the associated coupon and take it with your prescription to the pharmacy.  ?- You may also stop by our office during regular business hours and pick up a GoodRx coupon card.  ?- If you need your prescription sent electronically to a different pharmacy, notify our office through [facility] or by phone at [phone] option 4. ? ? ? ? ?Si Usted Necesita Algo Despu?s de Su Visita ? ?Tambi?n puede enviarnos un mensaje a trav?s de MyChart. Por lo general respondemos a los mensajes de MyChart en el transcurso de 1 a 2 d?as h?biles. ? ?Para renovar recetas, por favor pida a su farmacia que se ponga en contacto con nuestra oficina. Nuestro n?mero de fax es el [phone]. ? ?Si tiene un asunto urgente cuando la cl?nica est? cerrada y que no puede esperar hasta el siguiente d?a h?bil, puede llamar/localizar a su doctor(a) al n?mero que aparece a continuaci?n.  ? ?Por favor, tenga en cuenta que aunque hacemos todo lo posible para estar disponibles para asuntos urgentes fuera del horario de oficina, no estamos disponibles las 24 horas del d?a, los 7 d?as de  la semana.  ? ?Si tiene un problema urgente y no puede comunicarse con nosotros, puede optar por buscar atenci?n m?dica  en el consultorio de su doctor(a), en una cl?nica privada, en un centro de atenci?n urgente o en una sala de emergencias. ? ?Si tiene Engineer, maintenance (IT) m?dica, por favor llame inmediatamente al 911 o vaya a la sala de emergencias. ? ?N?meros de b?per ? ?- Dr. Nehemiah Massed: [phone] ? ?- Dra. Moye: [phone] ? ?- Dra. Nicole Kindred: [phone] ? ?En caso de inclemencias del tiempo, por favor llame a nuestra l?nea principal al [phone] para una actualizaci?n sobre el estado de cualquier retraso o cierre. ? ?Consejos para la medicaci?n en dermatolog?a: ?Por favor, guarde las cajas en las que vienen los medicamentos de uso t?pico para ayudarle a seguir las instrucciones sobre d?nde y c?mo usarlos. Las farmacias generalmente imprimen las instrucciones del medicamento s?lo en las cajas y no directamente en los tubos del [place].  ? ?Si su medicamento es muy caro, por favor, p?ngase en  contacto con Zigmund Daniel llamando al [phone] y presione la opci?n 4 o env?enos un mensaje a trav?s de MyChart.  ? ?No podemos decirle cu?l ser? su copago por los medicamentos por adelantado ya que esto es diferente dependiendo de la cobertura de su seguro. Sin embargo, es posible que podamos encontrar un medicamento sustituto a Electrical engineer un formulario para que el seguro cubra el medicamento que se considera necesario.  ? ?Si se requiere Ardelia Mems autorizaci?n previa para que su compa??a de seguros [place] su medicamento, por favor perm?tanos de 1 a 2 d?as h?biles para completar este proceso. ? ?Los precios de los medicamentos var?an con frecuencia dependiendo del Environmental consultant de d?nde se surte la receta y alguna farmacias pueden ofrecer precios m?s baratos. ? ?El sitio web www.goodrx.com tiene cupones para medicamentos de Airline pilot. Los precios aqu? no tienen en cuenta lo que podr?a costar con la ayuda  del seguro (puede ser m?s barato con su seguro), pero el sitio web puede darle el precio si no utiliz? ning?n seguro.  ?- Puede imprimir el cup?n correspondiente y llevarlo con su receta a la farmacia.  ?- Tambi

## 2022-02-08 NOTE — Progress Notes (Signed)
? ?New Patient Visit ? ?Subjective  ?Andres Mullins is a 71 y.o. male who presents for the following: Spot (Back, noticed by doctor at the cancer center. Pt unsure of how long area has been there. ), Psoriasis (Bil lower legs. History of psoriasis 12 years ago, then cleared. New rash on legs started in November 2022. Pt using hydrocortisone valerate 0.2% (Westcort). ), and Growths (Back, irritated and bothersome). He has a pink spot on his right cheek that doesn't go away. 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  ? ?Referral from Charlaine Dalton, MD. ? ?The following portions of the chart were reviewed this encounter and updated as appropriate:  ?  ?  ? ?Review of Systems:  No other skin or systemic complaints except as noted in HPI or Assessment and Plan. ? ?Objective  ?Well appearing patient in no apparent distress; mood and affect are within normal limits. ? ?A focused examination was performed including all skin waist up and lower legs. Relevant physical exam findings are noted in the Assessment and Plan. ? ?Right Malar Cheek x 1, Right Temple x 1 (2) ?Pink scaly macules ? ?lower legs ?Multiple pink scaly thin plaques on the bilateral pretibia ? ?back x 6, R shoulder x 1 (7) ?Erythematous stuck-on, waxy papule ?No suspicious lesions on back, some ISks are dark brown and were treated today with cryotherapy ? ?face ?Mild erythema of the cheeks, nose ? ? ? ?Assessment & Plan  ? ?Skin cancer screening performed today. ? ?Actinic Damage ?- chronic, secondary to cumulative UV radiation exposure/sun exposure over time ?- diffuse scaly erythematous macules with underlying dyspigmentation ?- Recommend daily broad spectrum sunscreen SPF 30+ to sun-exposed areas, reapply every 2 hours as needed.  ?- Recommend staying in the shade or wearing long sleeves, sun glasses (UVA+UVB protection) and wide brim hats (4-inch brim around the entire circumference of the hat). ?- Call for new or  changing lesions. ? ?Seborrheic Keratoses ?- Stuck-on, waxy, tan-brown papules and/or plaques  ?- Benign-appearing ?- Discussed benign etiology and prognosis. ?- Observe ?- Call for any changes ? ?Acrochordons (Skin Tags) ?- Fleshy, skin-colored pedunculated papules ?- Benign appearing.  ?- Observe. ?- If desired, they can be removed with an in office procedure that is not covered by insurance. ?- Please call the clinic if you notice any new or changing lesions. ? ?AK (actinic keratosis) (2) ?Right Malar Cheek x 1, Right Temple x 1 ? ?Actinic keratoses are precancerous spots that appear secondary to cumulative UV radiation exposure/sun exposure over time. They are chronic with expected duration over 1 year. A portion of actinic keratoses will progress to squamous cell carcinoma of the skin. It is not possible to reliably predict which spots will progress to skin cancer and so treatment is recommended to prevent development of skin cancer. ? ?Recommend daily broad spectrum sunscreen SPF 30+ to sun-exposed areas, reapply every 2 hours as needed.  ?Recommend staying in the shade or wearing long sleeves, sun glasses (UVA+UVB protection) and wide brim hats (4-inch brim around the entire circumference of the hat). ?Call for new or changing lesions. ? ?Destruction of lesion - Right Malar Cheek x 1, Right Temple x 1 ? ?Destruction method: cryotherapy   ?Informed consent: discussed and consent obtained   ?Lesion destroyed using liquid nitrogen: Yes   ?Region frozen until ice ball extended beyond lesion: Yes   ?Outcome: patient tolerated procedure well with no complications   ?Post-procedure details: wound care instructions  given   ?Additional details:  Prior to procedure, discussed risks of blister formation, small wound, skin dyspigmentation, or rare scar following cryotherapy. Recommend Vaseline ointment to treated areas while healing. ? ? ?Psoriasis ?lower legs ? ?Chronic and persistent condition with duration or expected  duration over one year. Condition is bothersome/symptomatic for patient. Currently flared.  ? ?Psoriasis is a chronic non-curable, but treatable genetic/hereditary disease that may have other systemic features affecting other organ systems such as joints (Psoriatic Arthritis). It is associated with an increased risk of inflammatory bowel disease, heart disease, non-alcoholic fatty liver disease, and depression.   ? ?Start clobetasol cream Apply to AA rash on legs qd/bid until improved dsp 60g 2Rf. Avoid face, groin, axilla.  ? ?Once improved (~2 weeks), may switch to hydrocortisone valerate 0.2% cream until clear. Pt has at home.  ? ?Discussed Xtrac laser. Patient will start pending insurance approval.  ?Don't recommend biologic treatment, patient with CLL (chronic lymphocytic leukemia). ? ?Topical steroids (such as triamcinolone, fluocinolone, fluocinonide, mometasone, clobetasol, halobetasol, betamethasone, hydrocortisone) can cause thinning and lightening of the skin if they are used for too long in the same area. Your physician has selected the right strength medicine for your problem and area affected on the body. Please use your medication only as directed by your physician to prevent side effects.  ? ? ?clobetasol cream (TEMOVATE) 0.05 % - lower legs ?Apply to affected areas rash on legs once to twice daily until improved. Avoid face, groin, axilla. ? ?Related Medications ?hydrocortisone valerate cream (WESTCORT) 0.2 % ?APPLY TO AFFECTED AREA TWICE A DAY ? ?Inflamed seborrheic keratosis (7) ?back x 6, R shoulder x 1 ? ?Destruction of lesion - back x 6, R shoulder x 1 ? ?Destruction method: cryotherapy   ?Informed consent: discussed and consent obtained   ?Lesion destroyed using liquid nitrogen: Yes   ?Region frozen until ice ball extended beyond lesion: Yes   ?Outcome: patient tolerated procedure well with no complications   ?Post-procedure details: wound care instructions given   ?Additional details:  Prior  to procedure, discussed risks of blister formation, small wound, skin dyspigmentation, or rare scar following cryotherapy. Recommend Vaseline ointment to treated areas while healing. ? ? ?Rosacea ?face ? ?Chronic condition with duration or expected duration over one year. Currently well-controlled.  ? ?Rosacea is a chronic progressive skin condition usually affecting the face of adults, causing redness and/or acne bumps. It is treatable but not curable. It sometimes affects the eyes (ocular rosacea) as well. It may respond to topical and/or systemic medication and can flare with stress, sun exposure, alcohol, exercise and some foods.  Daily application of broad spectrum spf 30+ sunscreen to face is recommended to reduce flares. ? ?Continue metronidazole as previously prescribed. Pt has at home.  ? ? ?Return in about 1 month (around 03/11/2022) for Psoriasis. ? ?I, Jamesetta Orleans, CMA, am acting as scribe for Brendolyn Patty, MD . ? ?Documentation: I have reviewed the above documentation for accuracy and completeness, and I agree with the above. ? ?Brendolyn Patty MD  ? ?

## 2022-02-09 ENCOUNTER — Telehealth: Payer: Self-pay

## 2022-02-09 NOTE — Telephone Encounter (Signed)
Left message for patient to return my call regarding xtrac benefits. aw ?

## 2022-02-13 ENCOUNTER — Encounter: Payer: Self-pay | Admitting: Internal Medicine

## 2022-02-13 ENCOUNTER — Other Ambulatory Visit: Payer: Self-pay

## 2022-02-13 ENCOUNTER — Ambulatory Visit (INDEPENDENT_AMBULATORY_CARE_PROVIDER_SITE_OTHER): Payer: Medicare PPO | Admitting: Dermatology

## 2022-02-13 DIAGNOSIS — L400 Psoriasis vulgaris: Secondary | ICD-10-CM

## 2022-02-13 NOTE — Progress Notes (Signed)
Patient here today for Xtrac treatment. ? ?Total Surface Area: 200cm2 ?Total Energy: 80.00J ? ?Johnsie Kindred, RMA ? ?Documentation: I have reviewed the above documentation for accuracy and completeness, and I agree with the above. ? ?Brendolyn Patty MD  ?

## 2022-02-13 NOTE — Progress Notes (Signed)
?Cardiology Office Note:   ? ?Date:  02/16/2022  ? ?ID:  Andres Mullins, DOB Sep 07, 1951, MRN [id number] ? ?PCP:  Andres Grana, PA-C  ?[facility] Cardiologist:  Andres Bush, MD  ?[facility] Electrophysiologist:  None  ? ?Referring MD: Andres Grana, PA-C  ? ?Chief Complaint: Hospital f/u ? ?History of Present Illness:   ? ?Andres Mullins is a 71 y.o. male with a hx of pericardial effusion status post pericardiocentesis on 10/03/2021, coronary artery calcification and aortic atherosclerosis noted on prior lung cancer screening CT, CLL, atrial tachycardia, HLD, ongoing tobacco use, celiac disease, vitamin D deficiency, and psoriasis who is being seen today for the evaluation of dyspnea. ? ?He established with Andres Mullins in 2017 for incidental notation of coronary artery calcification on prior lung cancer screening CT. He was incidentally noted to have atrial tachycardia, and was asymptomatic at that time.  He was initiated on beta-blocker therapy.  Echo in 2017 showed a normal EF and a mildly dilated aortic root/a descending aorta.  He ultimately discontinued metoprolol due to fatigue and a feeling of melancholy. ? ?In 09/2021  he had URI symptoms with associated sore throat, fatigue, appetite changes, hot flashes, and shortness of breath.  He followed up with his heme-onc team for planned anticipation of increase in venetoclax.  He was tachycardic at that visit.  His sore throat was felt to be due to thrush and he was started on nystatin.  CXR was performed and showed a new airspace opacity in the left lung base suspicious for pneumonia.  He was initiated on Levaquin.  Following this, he developed intermittent dull chest pain that was not specifically positional or pleuritic.  He presented to the [facility] ED on 10/03/2021 with repeat CXR showing worsening left basilar opacities concerning for infection.  CTA chest showed a new large pericardial effusion when compared to prior CT from 07/2021 along with  bilateral pleural effusions with associated lower lobe consolidation with the left being greater than the right, interval decrease in axillary and mediastinal lymph nodes, and no evidence of pulmonary emboli.  Stat echo demonstrated an EF of 60 to 65%, no regional wall motion abnormalities, normal LV diastolic function parameters, mildly reduced RV systolic function with mildly enlarged RV cavity size, prominent epicardial fat moderate appearing effusion primarily over the RV free wall with no obvious tamponade, although there was respiratory variation in TR and flows, and mitral inflows with respirometer not done.  He underwent pericardiocentesis that morning, that was overall difficult from the subxiphoid approach with removal of 200 cc of serosanguineous fluid.  Repeat echo demonstrated the fluid anterior to the RV had resolved and drain was removed in the Cath Lab.  Follow-up echo the next day showed an EF of 55 to 60%, no regional wall motion abnormalities, grade 1 diastolic dysfunction, normal RV systolic function and ventricular cavity size, and a small pericardial effusion that was lateral to the left ventricle with no evidence of tamponade physiology.  Cytology was negative for malignancy.  Gram stain was negative, and culture showed no growth.  During his admission, high-sensitivity troponins were negative.  ? ?He presented to the [facility] ED on 10/16/2021 with increased pleuritic chest pain with symptoms being worse with deep inspiration and when lying down.  CTA of the chest demonstrated an acute PE that was overall small in burden.  Repeat echo showed an EF of 55 to 60%, no regional wall motion abnormalities, normal LV diastolic function parameters, normal RV systolic function  and ventricular cavity size, and a small pericardial effusion that was circumferential.  There were no significant valvular abnormalities.  Aortic root was mildly dilated at 40 mm.  High-sensitivity troponins negative.  EKG showed  sinus tachycardia with a right bundle branch block.   ?  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 Colchicine was stopped after 3 months of therapy.  ? ?He was seen in the ED on 01/05/2022 with chest pressure and dyspnea.  Work-up at that time included high-sensitivity troponin normal x2, normal D-dimer, COVID and influenza negative, mild leukocytosis of 13.4, Hgb 15.9, chest x-ray without cardiopulmonary disease, and CT chest without contrast without acute intrathoracic process.  EKG showed sinus tachycardia with an incomplete right bundle branch block and nonspecific ST-T changes. He was discharged to outpatient follow-up. ? ?He was admitted to the hospital 01/17/22 for dyspnea and chest pressure found to have bilateral pleural effusions. Also reported fever and night sweats. IR was consulted for thoracentesis, US showed minimal fluid. ID recommended QunatiFERON and work-up for vasculitis. Echo showed EF 55-60%, no tamponade. Colchicine was discontinued. ? ?Today, the patient reports he is feeling much better, almost back to normal. He is on a Holiday from chemotherapy drug (Venclexta) and it seems this has greatly improved his symptoms. He is unsure if he will restart this again. Breathing is back to normal. No chest pain. No LLE, orthopnea, pnd. Recent admission reviewed.  ? ?Past Medical History:  ?Diagnosis Date  ? Atherosclerosis of coronary artery 09/28/2016  ? Chest CT Nov 2017  ? Atrial tachycardia ([place])   ? CLL (chronic lymphocytic leukemia) ([place])   ? Elevated C-reactive protein (CRP)   ? Elevated PSA   ? Fractured sternum 1999  ? Herpes   ?  History of rib fracture 1999  ? multiple  ? Hyperlipidemia   ? Psoriasis   ? Pulmonary embolism ([place])   ? Rheumatoid factor positive 02/2014  ? Rosacea   ? Thoracic aortic atherosclerosis ([place]) 09/28/2016  ? Chest CT Nov 2017  ? Tobacco abuse   ? Vitamin D deficiency disease   ? ? ?Past Surgical History:  ?Procedure Laterality Date  ? APPENDECTOMY  1974  ? PERICARDIOCENTESIS N/A 10/03/2021  ? Procedure: PERICARDIOCENTESIS;  Surgeon: Jettie Booze, MD;  Location: [facility];  Service: Cardiovascular;  Laterality: N/A;  ? PORTA CATH INSERTION N/A 06/06/2021  ? Procedure: PORTA CATH INSERTION;  Surgeon: Algernon Huxley, MD;  Location: [facility];  Service: Cardiovascular;  Laterality: N/A;  ? SHOULDER ARTHROSCOPY  10/23/2016  ? Procedure: ARTHROSCOPY SHOULDER WITH DISTAL CLAVICLE EXCISION, PARTIAL ACROMIONECTOMY, AND DEBRIDEMENT;  Surgeon: Earnestine Leys, MD;  Location: [facility];  Service: Orthopedics;;  ? TIBIA FRACTURE SURGERY Left 2004  ? titanium rod, ORIF  ? ? ?Current Medications: ?Current Meds  ?Medication Sig  ? acyclovir (ZOVIRAX) 400 MG tablet Take 1 tablet (400 mg total) by mouth 2 (two) times daily.  ? apixaban (ELIQUIS) 5 MG TABS tablet Take 1 tablet (5 mg total) by mouth 2 (two) times daily.  ? cholecalciferol (VITAMIN D3) 25 MCG (1000 UNIT) tablet Take 1,000 Units by mouth daily.  ? clobetasol cream (TEMOVATE) 0.05 % Apply to  affected areas rash on legs once to twice daily until improved. Avoid face, groin, axilla.  ? hydrocortisone valerate cream (WESTCORT) 0.2 % APPLY TO AFFECTED AREA TWICE A DAY  ? lidocaine-prilocaine (EMLA) cream Apply 30 -45 mins prior to port access.  ? oxyCODONE (OXY IR/ROXICODONE) 5 MG immediate release tablet Take 1 tablet (5 mg total) by mouth every 6 (six) hours as needed for moderate pain or severe pain.  ?  ? ?Allergies:   Gluten meal  ? ?Social History  ? ?Socioeconomic History  ? Marital status: Married  ?  Spouse name: colleen  ? Number of children:  Not on file  ? Years of education: 8  ? Highest education level: Bachelor's degree (e.g., BA, AB, BS)  ?Occupational History  ? Occupation: retired  ?Tobacco Use  ? Smoking status: Every Day  ?  Packs/day: 0.50  ?  Alfred Levins

## 2022-02-15 ENCOUNTER — Other Ambulatory Visit: Payer: Self-pay

## 2022-02-15 ENCOUNTER — Ambulatory Visit (INDEPENDENT_AMBULATORY_CARE_PROVIDER_SITE_OTHER): Payer: Medicare PPO | Admitting: Dermatology

## 2022-02-15 DIAGNOSIS — L400 Psoriasis vulgaris: Secondary | ICD-10-CM

## 2022-02-15 NOTE — Progress Notes (Signed)
Patient here today for xtrac treatment for psoriasis  ? ?Total Surface Area: 196cm2 ?Total Energy: 90.16J ? ?Johnsie Kindred, RMA ? ?Documentation: I have reviewed the above documentation for accuracy and completeness, and I agree with the above. ? ?Brendolyn Patty MD  ?

## 2022-02-16 ENCOUNTER — Ambulatory Visit (INDEPENDENT_AMBULATORY_CARE_PROVIDER_SITE_OTHER): Payer: Medicare PPO | Admitting: Medical

## 2022-02-16 ENCOUNTER — Encounter: Payer: Self-pay | Admitting: Medical

## 2022-02-16 VITALS — BP 110/70 | HR 78 | Ht 66.0 in | Wt 164.5 lb

## 2022-02-16 DIAGNOSIS — C9110 Chronic lymphocytic leukemia of B-cell type not having achieved remission: Secondary | ICD-10-CM

## 2022-02-16 DIAGNOSIS — I3139 Other pericardial effusion (noninflammatory): Secondary | ICD-10-CM

## 2022-02-16 DIAGNOSIS — I2699 Other pulmonary embolism without acute cor pulmonale: Secondary | ICD-10-CM | POA: Diagnosis not present

## 2022-02-16 DIAGNOSIS — J90 Pleural effusion, not elsewhere classified: Secondary | ICD-10-CM

## 2022-02-16 DIAGNOSIS — I2584 Coronary atherosclerosis due to calcified coronary lesion: Secondary | ICD-10-CM | POA: Diagnosis not present

## 2022-02-16 DIAGNOSIS — I2510 Atherosclerotic heart disease of native coronary artery without angina pectoris: Secondary | ICD-10-CM | POA: Diagnosis not present

## 2022-02-16 DIAGNOSIS — E785 Hyperlipidemia, unspecified: Secondary | ICD-10-CM | POA: Diagnosis not present

## 2022-02-16 NOTE — Patient Instructions (Signed)
Medication Instructions:  ?Your physician recommends that you continue on your current medications as directed. Please refer to the Current Medication list given to you today. ?  ?*If you need a refill on your cardiac medications before your next appointment, please call your pharmacy* ? ? ?Lab Work: ?None ordered ? ?If you have labs (blood work) drawn today and your tests are completely normal, you will receive your results only by: ?MyChart Message (if you have MyChart) OR ?A paper copy in the mail ?If you have any lab test that is abnormal or we need to change your treatment, we will call you to review the results. ? ? ?Testing/Procedures: ?None ordered ? ? ?Follow-Up: ?At [facility], you and your health needs are our priority.  As part of our continuing mission to provide you with exceptional heart care, we have created designated Provider Care Teams.  These Care Teams include your primary Cardiologist (physician) and Advanced Practice Providers (APPs -  Physician Assistants and Nurse Practitioners) who all work together to provide you with the care you need, when you need it. ? ?We recommend signing up for the patient portal called "MyChart".  Sign up information is provided on this After Visit Summary.  MyChart is used to connect with patients for Virtual Visits (Telemedicine).  Patients are able to view lab/test results, encounter notes, upcoming appointments, etc.  Non-urgent messages can be sent to your provider as well.   ?To learn more about what you can do with MyChart, go to [email or web address].   ? ?Your next appointment:   ?As scheduled ? ?The format for your next appointment:   ?In Person ? ?Provider:   ?You may see [name], MD or one of the following Advanced Practice Providers on your designated Care Team:   ?Murray Hodgkins, NP ?Christell Faith, PA-C ?Cadence Kathlen Mody, PA-C ? ? ?Other Instructions ?N/A ?

## 2022-02-20 ENCOUNTER — Other Ambulatory Visit: Payer: Self-pay

## 2022-02-20 ENCOUNTER — Ambulatory Visit (INDEPENDENT_AMBULATORY_CARE_PROVIDER_SITE_OTHER): Payer: Medicare PPO | Admitting: Dermatology

## 2022-02-20 DIAGNOSIS — L400 Psoriasis vulgaris: Secondary | ICD-10-CM | POA: Diagnosis not present

## 2022-02-20 NOTE — Progress Notes (Signed)
Patient here today for xtrac treatment for psoriasis.  ? ?Total Surface Area: 168cm2 ?Total Energy: 88.87J ? ?Johnsie Kindred, RMA ? ?Documentation: I have reviewed the above documentation for accuracy and completeness, and I agree with the above. ? ?Brendolyn Patty MD  ? ?

## 2022-02-21 ENCOUNTER — Other Ambulatory Visit (HOSPITAL_COMMUNITY): Payer: Self-pay

## 2022-02-21 ENCOUNTER — Telehealth: Payer: Self-pay | Admitting: *Deleted

## 2022-02-21 ENCOUNTER — Ambulatory Visit: Payer: Self-pay

## 2022-02-21 ENCOUNTER — Inpatient Hospital Stay: Payer: Medicare PPO | Admitting: Medical Oncology

## 2022-02-21 ENCOUNTER — Inpatient Hospital Stay: Payer: Medicare PPO

## 2022-02-21 NOTE — Telephone Encounter (Signed)
Per Shirlean Mylar in schedule. Scheduler talked to him earlier. "He said he came in contact w/ covid last night so I rescheduled him and he wants to see Dr B so the 14th was his first available" ?

## 2022-02-21 NOTE — Telephone Encounter (Signed)
?  Chief Complaint: COVID exposure ?Symptoms: None ?Frequency: 3 days ?Pertinent Negatives: NA ?Disposition: [] ED /[] Urgent Care (no appt availability in office) / [] Appointment(In office/virtual)/ []  [facility]/ [] Home Care/ [] Refused Recommended Disposition /[] [place] [facility]/ [x]  Follow-up with PCP ?Additional Notes: Pt states that they had a friend come from out of state that is staying with them, she got a call that her husband tested positive Sunday night, pt has been staying in camper while friend and wife are in the house on different levels and have been wearing masks when around one another. Pt denies having any symptoms. Pt was concerned and wanting to know if anti-viral could be prescribed just in case symptoms started and if it is sent to pharmacy can he notified. He states mychart message would be fine.  ? ? ?Summary: COVID concerns  ? The patient believes that they have been exposed to COVID 19  ? ?The patient is currently experiencing no symptoms but has been told that a house guest may be positive  ? ?The patient has not tested but is concerned due to their current health / being immunocompromised  ? ?The patient would like to discuss testing and possible proscriptions as needed  ? ?Please contact further   ?  ? ?Reason for Disposition ? [1] CLOSE CONTACT COVID-19 EXPOSURE within last 14 days AND [2] weak immune system (e.g., HIV positive, cancer chemo, splenectomy, organ transplant, chronic steroids) AND [3] NO symptoms ? ?Answer Assessment - Initial Assessment Questions ?1. COVID-19 EXPOSURE: "Please describe how you were exposed to someone with a COVID-19 infection." ?    Has guest at home from out of state husband tested positive ?2. PLACE of CONTACT: "Where were you when you were exposed to COVID-19?" (e.g., home, school, medical waiting room; which city?) ?    home ?3. TYPE of CONTACT: "How much contact was there?" (e.g., sitting next to, live in same house, work in same  office, same building) ?    In same house ?4. DURATION of CONTACT: "How long were you in contact with the COVID-19 patient?" (e.g., a few seconds, passed by person, a few minutes, 15 minutes or longer, live with the patient) ?    Several hours ?5. MASK: "Were you wearing a mask?" "Was the other person wearing a mask?" Note: wearing a mask reduces the risk of an otherwise close contact. ?    yes ?6. DATE of CONTACT: "When did you have contact with a COVID-19 patient?" (e.g., how many days ago) ?    02/19/22 ?8. SYMPTOMS: "Do you have any symptoms?" (e.g., fever, cough, breathing difficulty, loss of taste or smell) ?    No ?9. VACCINE: "Have you gotten the COVID-19 vaccine?" If Yes, ask: "Which one, how many shots, when did you get it?" ?    yes ?10. BOOSTER: "Have you received your COVID-19 booster?" If Yes, ask: "Which one and when did you get it?" ?      yes ?12. HIGH RISK: "Do you have any heart or lung problems?" (e.g., asthma , COPD, heart failure) "Do you have a weak immune system or other risk factors?" (e.g., HIV positive, chemotherapy, renal failure, diabetes mellitus, sickle cell anemia, obesity) ?      Yes immunocompromised ? ?Protocols used: Coronavirus (ZOXWR-60) Exposure-A-AH ? ?

## 2022-02-21 NOTE — Telephone Encounter (Signed)
Department: [facility]  ?Provider:   ?Appointment Notes:  ?Please reschedule this mornings appt because pt has been exposed to [place]. I already spoke to Judson Roch regarding this and she prefers to reschedule rather than change to a video appt as he needs labs drawn too  ?Scheduling Notes:  ?  ?----- Message -----  ?From: Betti Cruz, RN  ?Sent: 02/21/2022   9:08 AM EDT  ?To: Scheduling Message Pool  ? ?

## 2022-02-21 NOTE — Telephone Encounter (Signed)
Pt has been notified he will need to be see, but he needs to have sx related to COVID or test positive in order to get something. Also spoke to [name] to make sure and she agreed. ?

## 2022-02-22 ENCOUNTER — Ambulatory Visit: Payer: Medicare PPO

## 2022-02-27 ENCOUNTER — Ambulatory Visit: Payer: Medicare PPO

## 2022-03-01 ENCOUNTER — Ambulatory Visit: Payer: Medicare PPO

## 2022-03-03 ENCOUNTER — Ambulatory Visit: Payer: Medicare PPO | Admitting: Medical Oncology

## 2022-03-03 ENCOUNTER — Other Ambulatory Visit: Payer: Medicare PPO

## 2022-03-06 ENCOUNTER — Ambulatory Visit (INDEPENDENT_AMBULATORY_CARE_PROVIDER_SITE_OTHER): Payer: Medicare PPO | Admitting: Dermatology

## 2022-03-06 DIAGNOSIS — L400 Psoriasis vulgaris: Secondary | ICD-10-CM | POA: Diagnosis not present

## 2022-03-06 NOTE — Progress Notes (Signed)
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 ?Patient did skip last weeks treatments due to [place] exposure.  ? ?Total Surface Area: 104cm2 ?Total Energy: 55.02J ?Service Code: [phone] ? ?Johnsie Kindred, RMA ?Documentation: I have reviewed the above documentation for accuracy and completeness, and I agree with the above. ? ?Sarina Ser, MD ? ? ?

## 2022-03-07 ENCOUNTER — Encounter: Payer: Self-pay | Admitting: Dermatology

## 2022-03-08 ENCOUNTER — Ambulatory Visit: Payer: Medicare PPO

## 2022-03-09 ENCOUNTER — Other Ambulatory Visit (HOSPITAL_COMMUNITY): Payer: Self-pay

## 2022-03-09 ENCOUNTER — Ambulatory Visit (INDEPENDENT_AMBULATORY_CARE_PROVIDER_SITE_OTHER): Payer: Medicare PPO | Admitting: Dermatology

## 2022-03-09 DIAGNOSIS — L400 Psoriasis vulgaris: Secondary | ICD-10-CM

## 2022-03-09 NOTE — Progress Notes (Signed)
Patient here today for xtrac treatment for psoriasis vulgaris.  ? ?Total Surface Area: 56cm2 ?Total Energy: 34.05J ?Service Code: [phone] ? ?Johnsie Kindred, RMA ?Documentation: I have reviewed the above documentation for accuracy and completeness, and I agree with the above. ? ?Sarina Ser, MD ? ? ?

## 2022-03-13 ENCOUNTER — Other Ambulatory Visit (HOSPITAL_COMMUNITY): Payer: Self-pay

## 2022-03-13 ENCOUNTER — Telehealth: Payer: Self-pay | Admitting: Pharmacist

## 2022-03-13 ENCOUNTER — Encounter: Payer: Self-pay | Admitting: Internal Medicine

## 2022-03-13 ENCOUNTER — Inpatient Hospital Stay (HOSPITAL_BASED_OUTPATIENT_CLINIC_OR_DEPARTMENT_OTHER): Payer: Medicare PPO | Admitting: Internal Medicine

## 2022-03-13 ENCOUNTER — Inpatient Hospital Stay: Payer: Medicare PPO | Attending: Internal Medicine

## 2022-03-13 DIAGNOSIS — D8489 Other immunodeficiencies: Secondary | ICD-10-CM | POA: Diagnosis not present

## 2022-03-13 DIAGNOSIS — C9110 Chronic lymphocytic leukemia of B-cell type not having achieved remission: Secondary | ICD-10-CM | POA: Insufficient documentation

## 2022-03-13 DIAGNOSIS — J9600 Acute respiratory failure, unspecified whether with hypoxia or hypercapnia: Secondary | ICD-10-CM | POA: Diagnosis not present

## 2022-03-13 DIAGNOSIS — I3139 Other pericardial effusion (noninflammatory): Secondary | ICD-10-CM | POA: Insufficient documentation

## 2022-03-13 DIAGNOSIS — R21 Rash and other nonspecific skin eruption: Secondary | ICD-10-CM | POA: Diagnosis not present

## 2022-03-13 DIAGNOSIS — Z95828 Presence of other vascular implants and grafts: Secondary | ICD-10-CM

## 2022-03-13 DIAGNOSIS — Z79899 Other long term (current) drug therapy: Secondary | ICD-10-CM | POA: Insufficient documentation

## 2022-03-13 DIAGNOSIS — Z7901 Long term (current) use of anticoagulants: Secondary | ICD-10-CM | POA: Insufficient documentation

## 2022-03-13 LAB — CBC WITH DIFFERENTIAL/PLATELET
Abs Immature Granulocytes: 0.04 10*3/uL (ref 0.00–0.07)
Basophils Absolute: 0.1 10*3/uL (ref 0.0–0.1)
Basophils Relative: 1 %
Eosinophils Absolute: 0.1 10*3/uL (ref 0.0–0.5)
Eosinophils Relative: 1 %
HCT: 44.5 % (ref 39.0–52.0)
Hemoglobin: 15.3 g/dL (ref 13.0–17.0)
Immature Granulocytes: 0 %
Lymphocytes Relative: 17 %
Lymphs Abs: 1.5 10*3/uL (ref 0.7–4.0)
MCH: 30 pg (ref 26.0–34.0)
MCHC: 34.4 g/dL (ref 30.0–36.0)
MCV: 87.3 fL (ref 80.0–100.0)
Monocytes Absolute: 0.7 10*3/uL (ref 0.1–1.0)
Monocytes Relative: 8 %
Neutro Abs: 6.6 10*3/uL (ref 1.7–7.7)
Neutrophils Relative %: 73 %
Platelets: 336 10*3/uL (ref 150–400)
RBC: 5.1 MIL/uL (ref 4.22–5.81)
RDW: 14.4 % (ref 11.5–15.5)
WBC: 9 10*3/uL (ref 4.0–10.5)
nRBC: 0 % (ref 0.0–0.2)

## 2022-03-13 LAB — COMPREHENSIVE METABOLIC PANEL
ALT: 15 U/L (ref 0–44)
AST: 20 U/L (ref 15–41)
Albumin: 3.8 g/dL (ref 3.5–5.0)
Alkaline Phosphatase: 76 U/L (ref 38–126)
Anion gap: 6 (ref 5–15)
BUN: 10 mg/dL (ref 8–23)
CO2: 25 mmol/L (ref 22–32)
Calcium: 9.3 mg/dL (ref 8.9–10.3)
Chloride: 103 mmol/L (ref 98–111)
Creatinine, Ser: 0.85 mg/dL (ref 0.61–1.24)
GFR, Estimated: >60 mL/min (ref >60)
Glucose, Bld: 121 mg/dL — ABNORMAL HIGH (ref 70–99)
Potassium: 3.7 mmol/L (ref 3.5–5.1)
Sodium: 134 mmol/L — ABNORMAL LOW (ref 135–145)
Total Bilirubin: 0.4 mg/dL (ref 0.3–1.2)
Total Protein: 6.8 g/dL (ref 6.5–8.1)

## 2022-03-13 LAB — LACTATE DEHYDROGENASE: LDH: 128 U/L (ref 98–192)

## 2022-03-13 MED ORDER — SODIUM CHLORIDE 0.9% FLUSH
10.0000 mL | Freq: Once | INTRAVENOUS | Status: AC
  Administered 2022-03-13: 10 mL via INTRAVENOUS
  Filled 2022-03-13: qty 10

## 2022-03-13 MED ORDER — HEPARIN SOD (PORK) LOCK FLUSH 100 UNIT/ML IV SOLN
500.0000 [IU] | Freq: Once | INTRAVENOUS | Status: AC
  Administered 2022-03-13: 500 [IU] via INTRAVENOUS
  Filled 2022-03-13: qty 5

## 2022-03-13 MED ORDER — ACALABRUTINIB MALEATE 100 MG PO TABS
100.0000 mg | ORAL_TABLET | Freq: Two times a day (BID) | ORAL | 6 refills | Status: DC
  Filled 2022-03-13: qty 60, fill #0
  Filled 2022-03-21: qty 60, 30d supply, fill #0
  Filled 2022-04-11: qty 60, 30d supply, fill #1

## 2022-03-13 NOTE — Progress Notes (Signed)
I connected with [name] on 03/13/22 at  3:30 PM EDT by video enabled telemedicine visit and verified that I am speaking with the correct person using two identifiers.  ?I discussed the limitations, risks, security and privacy concerns of performing an evaluation and management service by telemedicine and the availability of in-person appointments. I also discussed with the patient that there may be a patient responsible charge related to this service. The patient expressed understanding and agreed to proceed.  ? ? ?Other persons participating in the visit and their role in the encounter: RN/medical reconciliation ?Patient?s location: office ?Provider?s location: home ? 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 ?# NOV 2020 PET [incidental]- Generalized Lymphadenopathy- left supraclav/ bil ax LN L> R; mesenteric LN; pelvic LN; FEB 5th 2021- 2.5cm left supraclav; Left ax LN- 2.4cm.  ? ?AUG 2021-Head and neck: The brain demonstrates symmetric appearing diffuse physiologic uptake without focal finding to suggest abnormality. In the left posterior and medial maxillary cavity, there is an area of mucoid deposition seen within SUV max of 28.8. This was seen in the previous study of November. The specific bony erosion, clear-cut soft tissue lesion is not detected. This probably represents a chronic sinus disease focus. The bilateral optic nerves and extraocular muscles to the largest nodes are seen on axial image 98. 1 now demonstrates a short axis measurement of 3.3 cm and the other more medially, a short axis measurement of 2.5 cm. Demonstrate symmetric increased uptake. The left, much greater than right, jugulodigastric and supraclavicular nodes have increased significantly in size and avidity since the previous study. Both of these lymph nodes demonstrate an SUV max of 3.9. No largest lymph  node in the right subclavicular space is seen on axial image 104, demonstrating an SUV max of 3.3, and a short axis measurement of 1.4 cm.  ? ?On axial image 102, a right paratracheal lymph node is again identified with increased avidity, located immediately posterior to the right thyroid pole. This area was noted previously, but now increased avidity is seen, with an SUV max of 8.6 in this area. Multiple smaller, and less avid lymph nodes scattered throughout the left greater than right neck are seen, as well as supraclavicular and infraclavicular areas.  ? ?Thorax: Significant axillary lymph nodes have increased significantly in size. These are also left greater than right, with the largest on the left seen on image #114 with a short axis measurement of 2.2 cm and an SUV max of 4.3. The largest on the right as seen on image 111, demonstrating a short axis of 1.4 cm and an SUV max of 4.0. Too numerous to count smaller and similarly avid lymph nodes in both axillae are also noted. These are all larger than previously seen and in general, more avid.  ?Numerous small AP window and lesser mediastinal lymph nodes are seen with minimal increased avidity, appearing similar in size and avidity to the previous study. No other significant mediastinal adenopathy. No hilar adenopathy detected. These likely also represent lymphoproliferative disease.  ?Hyperinflation of COPD, bibasilar dependent atelectasis, some paucity of lung tissue and biapical scarring are noted without other focal pulmonary mass or nodule, or acute pulmonary parenchymal disease. Physiologic uptake in the left heart is seen, as expected. Small hiatal hernia is again identified.  ? ?Abdomen pelvis: Physiologic uptake in the liver, gut and urinary tract is again noted. No focal area  of asymmetric uptake in the structures is seen. The spleen also demonstrates generalized increased uptake and stable splenomegaly, up to 14.2 cm. On image 198, a preaortic lymph  node is detected of increased size since the previous study, now measuring up to 1.2 cm short axis.  ?Several large lymph nodes on image 203 of the mesentery are noted. The largest of these, on image 201 and ureters up to 1.8 cm short axis. Despite the fact that measurable avidity is not detected in these are the surrounding lymph nodes, these are doubtless involved in the lymphoproliferative disease described, and appear larger. Multiple smaller lymph nodes scattered throughout the mesentery are also seen.  ?Grossly enlarged right greater than left common iliac chain lymph nodes are again seen, throughout the pelvis and into the inguinal areas bilaterally. The most avid on the right is seen on image #260 to, demonstrates a short axis of 2.5 cm, and an SUV max of 4.9. The largest on the left is identified image #272, demonstrates a short axis of 2.0 cm, and an SUV max of 4.7. These are similar to the previously noted values.  ?Right greater than left inguinal adenopathy is seen, with the largest lymph node seen on the right image 276, demonstrating a short axis of 1.5 cm and an SUV max of 3.9. This is larger in size and avidity than previously seen. There are bilateral enlarged lymph nodes associated with the external iliac vessels on image 277. These measure 2.0 cm on the right in short axis, with an SUV max of 3.9.  ?On the left, image 278, the external iliac lymph node is larger at 1.8 cm short axis, but with an SUV max of only 3.8. Lesser avid and smaller lymph nodes on both sides are also detected.  ? ?Musculoskeletal: Scattered, insignificant uptake is detected in the muscles especially in the bilateral joints, without focal finding to suggest lymphoproliferative disease.  ? ?IMPRESSIONS:  ?Significant increase in numbers, short axis size, and to a large extent, avidity -in the lymph nodes of the left greater than right neck, supraclavicular and infraclavicular areas, bilateral axillary lymph nodes and common  iliac chain, external iliac chain and inguinal lymph nodes, consistent with worsening lymphoproliferative disease. The mediastinal lymph nodes seen are still present and unchanged in size or [significantly] in avidity.    Evaro- Bx- SLL/CLL.July 2022-Discussed with Dr. Asquith-oncologist, [place]-reviewed the pathology of the lymph node biopsy in 2021-positive for SLL; negative for cyclin D1/1114 translocation.   ? ?# July 20th, 2022- GAZYVA #1 Aletha Halim plan ading VENATOCLAX after 2 cycles/de-bulking] s/p GAYZYVA [x6]- Feb 2023 - cycle #1  Venatoclax- x1 month- HELD sec to repeated hospitalization ? ?# December 2022-pericarditis/effusion needing pericardiocentesis; FEB 2023-acute respiratory failure-hospitalization-? Etiology [s/p ID; Pul; Dr.A; cards- [facility]] ? ?# Lung cancer screening- on [facility] ? ?# SURVIVORSHIP:  ? ?# GENETICS:  ? ?DIAGNOSIS:  ? ?STAGE:         ;  GOALS: ? ?CURRENT/MOST RECENT THERAPY :  ? ? ? ?  ?CLL (chronic lymphocytic leukemia) ([place])  ?10/29/2019 Initial Diagnosis  ? CLL (chronic lymphocytic leukemia) ([place]) ?  ?06/15/2021 -  Chemotherapy  ? Patient is on Treatment Plan : LYMPHOMA CLL/SLL Venetoclax + Obinutuzumab q28d  ? ?  ?  ? ? ? ?Chief Complaint: CLL  ? ?  ?History of present illness:Andres Mullins 71 y.o.  male with history of CLL currently on surveillance is here for follow-up. ? ?Patient has finished 6 cycles of Gazyva last finished in  January 2023.  Patient has been taken off venetoclax given recurrent hospitalizations/respiratory issues/pericarditis etc. ? ?Patient is currently off colchicine; also off steroids. ? ?Patient denies any new lumps or bumps.  Appetite is good.  No weight loss.  No nausea no vomiting.  No fever no chills.  ? ?Observation/objective: Alert & oriented x 3. In No acute distress.  ? ?Assessment and plan: ?CLL (chronic lymphocytic leukemia) ([place]) ?# SLL/CLL-  currently on  Gazyva x 6 cycles + venatoclax x65m However therapy interrupted-because of pneumonia/pericarditis  [see below]. #Patient currently s/p cycle #6 of Gazyva [#6 in Jan 2023]; Currently on venatoclax hold. Recommend discontinuing venetoclax at this time see discussion below. ? ?#Given the patient's overa

## 2022-03-13 NOTE — Assessment & Plan Note (Addendum)
#   SLL/CLL-  currently on  Gazyva x 6 cycles + venatoclax x14m However therapy interrupted-because of pneumonia/pericarditis [see below]. #Patient currently s/p cycle #6 of Gazyva [#6 in Jan 2023]; Currently on venatoclax hold. Recommend discontinuing venetoclax at this time see discussion below. ? ?#Given the patient's overall poor tolerance to Gazyva/venetoclax [multiple hospitalizations pericarditis pericardial effusions/respiratory issues]-I would recommend discontinuation of venatoclax. ? ?# Given the discrimination of above therapy I would recommend starting the patient on BTK inhibitor.  Recommend starting the patient on acalabrutinib 100 mg twice a day.  Discussed the mechanism of action; and usually indefinite duration of treatment.  Patient understands treatments are palliative not curative.  The potential side effects including-easy bruising; joint pains elevated blood pressure; LFT elevation skin rashes.  Also discussed the potential side effect of headaches which are usually relieved with caffeine.  Patient will start after his discontinuation of Eliquis in the next 2 weeks or so. ? ?# Repeated acute respiratory failure-2022 pericardial effusion/pericarditis- Most likely related to induced Gazyva/venetoclax.  The other etiology is-immunosuppression/possible undiagnosed infections.  We will check quantitative immunoglobulins.  ? ?# PE acute [NOV 25872]provoked--currently on Eliquis. Continue  Eliquis x 6 months-patient will stop in approximate 2 weeks/end of April 2023.  Patient will start acalbrutinib after stopping Eliquis. ? ?# Continue  shingles/TLS prophylaxis: Acyclovir- [stop in July 2023; [facility] recommend proceeding with shingles vaccinations.  STABLE. ? ?# Mediport placement: No malfunction noted. # port/IV access- Stable; discussed re: pro and cons of keeping the port vs. Explantation.  Patient unlikely to get any IV treatments in the foreseeable future.  Recommend  port explantation ? ?# DISPOSITION: ?# referral to IR for port explanation ?# follow up in 1st week of May-  - MD; labs- cbc/cmp/LDH; --Dr.B ? ?  ? ? ? ?

## 2022-03-13 NOTE — Telephone Encounter (Signed)
Oral Oncology Pharmacist Encounter ? ?Received new prescription for Calquence(acalabrutinib) for the treatment of CLL, planned duration until disease progression or unacceptable drug toxicity. MD plans to have the patient start at the beginning of May after he has stopped his apixaban. ? ?CBC from 03/13/22 assessed, no relevant lab abnormalities. Prescription dose and frequency assessed.  ? ?Current medication list in Epic reviewed, no DDIs with acalabrutinib identified. ? ?Evaluated chart and no patient barriers to medication adherence identified.  ? ?Prescription has been [facility]scribed to [facility] for benefits analysis and approval. ? ?Oral Oncology Clinic will continue to follow for insurance authorization, copayment issues, initial counseling and start date. ? ?Patient agreed to treatment on 03/13/22 per MD documentation. ? ?Darl Pikes, PharmD, BCPS, BCOP, CPP ?Hematology/Oncology Clinical Pharmacist Practitioner ?[place]/[facility] Oral Chemotherapy [facility] ?[phone] ? ?03/13/2022 3:54 PM  ?

## 2022-03-14 ENCOUNTER — Other Ambulatory Visit (HOSPITAL_COMMUNITY): Payer: Self-pay

## 2022-03-14 ENCOUNTER — Telehealth: Payer: Self-pay | Admitting: Pharmacy Technician

## 2022-03-14 ENCOUNTER — Ambulatory Visit (INDEPENDENT_AMBULATORY_CARE_PROVIDER_SITE_OTHER): Payer: Medicare PPO | Admitting: Dermatology

## 2022-03-14 DIAGNOSIS — L400 Psoriasis vulgaris: Secondary | ICD-10-CM

## 2022-03-14 DIAGNOSIS — L820 Inflamed seborrheic keratosis: Secondary | ICD-10-CM

## 2022-03-14 DIAGNOSIS — D485 Neoplasm of uncertain behavior of skin: Secondary | ICD-10-CM

## 2022-03-14 DIAGNOSIS — Z872 Personal history of diseases of the skin and subcutaneous tissue: Secondary | ICD-10-CM | POA: Diagnosis not present

## 2022-03-14 DIAGNOSIS — L719 Rosacea, unspecified: Secondary | ICD-10-CM | POA: Diagnosis not present

## 2022-03-14 DIAGNOSIS — L853 Xerosis cutis: Secondary | ICD-10-CM

## 2022-03-14 DIAGNOSIS — L821 Other seborrheic keratosis: Secondary | ICD-10-CM

## 2022-03-14 DIAGNOSIS — D1800 Hemangioma unspecified site: Secondary | ICD-10-CM

## 2022-03-14 DIAGNOSIS — L578 Other skin changes due to chronic exposure to nonionizing radiation: Secondary | ICD-10-CM | POA: Diagnosis not present

## 2022-03-14 DIAGNOSIS — Z1283 Encounter for screening for malignant neoplasm of skin: Secondary | ICD-10-CM

## 2022-03-14 DIAGNOSIS — L814 Other melanin hyperpigmentation: Secondary | ICD-10-CM | POA: Diagnosis not present

## 2022-03-14 DIAGNOSIS — D229 Melanocytic nevi, unspecified: Secondary | ICD-10-CM

## 2022-03-14 DIAGNOSIS — D489 Neoplasm of uncertain behavior, unspecified: Secondary | ICD-10-CM

## 2022-03-14 NOTE — Telephone Encounter (Signed)
Oral Oncology Patient Advocate Encounter ? ?After completing a benefits investigation, prior authorization for Calquence is not required at this time through [facility]. Authorization is already on file. ? ?Patient's copay is $12.89. Patients copay with [employer] as secondary is $0.00. ? ?Dennison Nancy CPHT ?Specialty Pharmacy Patient Advocate ?[place] ?Phone [phone] ?Fax [phone] ?03/14/2022 11:54 AM ?   ? ? ?  ? ?

## 2022-03-14 NOTE — Patient Instructions (Addendum)
Continue clobetasol cream apply as needed to use at raised and scaly areas. ? ? Topical steroids (such as triamcinolone, fluocinolone, fluocinonide, mometasone, clobetasol, halobetasol, betamethasone, hydrocortisone) can cause thinning and lightening of the skin if they are used for too long in the same area. Your physician has selected the right strength medicine for your problem and area affected on the body. Please use your medication only as directed by your physician to prevent side effects.  ? ?Biopsy Wound Care Instructions ? ?Leave the original bandage on for 24 hours if possible.  If the bandage becomes soaked or soiled before that time, it is OK to remove it and examine the wound.  A small amount of post-operative bleeding is normal.  If excessive bleeding occurs, remove the bandage, place gauze over the site and apply continuous pressure (no peeking) over the area for 30 minutes. If this does not work, please call our clinic as soon as possible or page your doctor if it is after hours.  ? ?Once a day, cleanse the wound with soap and water. It is fine to shower. If a thick crust develops you may use a Q-tip dipped into dilute hydrogen peroxide (mix 1:1 with water) to dissolve it.  Hydrogen peroxide can slow the healing process, so use it only as needed.   ? ?After washing, apply petroleum jelly (Vaseline) or an antibiotic ointment if your doctor prescribed one for you, followed by a bandage.   ? ?For best healing, the wound should be covered with a layer of ointment at all times. If you are not able to keep the area covered with a bandage to hold the ointment in place, this may mean re-applying the ointment several times a day.  Continue this wound care until the wound has healed and is no longer open.  ? ?Itching and mild discomfort is normal during the healing process. However, if you develop pain or severe itching, please call our office.  ? ?If you have any discomfort, you can take Tylenol  (acetaminophen) or ibuprofen as directed on the bottle. (Please do not take these if you have an allergy to them or cannot take them for another reason). ? ?Some redness, tenderness and white or yellow material in the wound is normal healing.  If the area becomes very sore and red, or develops a thick yellow-green material (pus), it may be infected; please notify us.   ? ?If you have stitches, return to clinic as directed to have the stitches removed. You will continue wound care for 2-3 days after the stitches are removed.  ? ?Wound healing continues for up to one year following surgery. It is not unusual to experience pain in the scar from time to time during the interval.  If the pain becomes severe or the scar thickens, you should notify the office.   ? ?A slight amount of redness in a scar is expected for the first six months.  After six months, the redness will fade and the scar will soften and fade.  The color difference becomes less noticeable with time.  If there are any problems, return for a post-op surgery check at your earliest convenience. ? ?To improve the appearance of the scar, you can use silicone scar gel, cream, or sheets (such as Mederma or Serica) every night for up to one year. These are available over the counter (without a prescription). ? ?Please call our office at [phone] for any questions or concerns. ? ? ? ? ?Cryotherapy Aftercare ? ?  Wash gently with soap and water everyday.   ?Apply Vaseline and Band-Aid daily until healed.  ? ? ? ?Seborrheic Keratosis ? ?What causes seborrheic keratoses? ?Seborrheic keratoses are harmless, common skin growths that first appear during adult life.  As time goes by, more growths appear.  Some people may develop a large number of them.  Seborrheic keratoses appear on both covered and uncovered body parts.  They are not caused by sunlight.  The tendency to develop seborrheic keratoses can be inherited.  They vary in color from skin-colored to gray,  brown, or even black.  They can be either smooth or have a rough, warty surface.   ?Seborrheic keratoses are superficial and look as if they were stuck on the skin.  Under the microscope this type of keratosis looks like layers upon layers of skin.  That is why at times the top layer may seem to fall off, but the rest of the growth remains and re-grows.   ? ?Treatment ?Seborrheic keratoses do not need to be treated, but can easily be removed in the office.  Seborrheic keratoses often cause symptoms when they rub on clothing or jewelry.  Lesions can be in the way of shaving.  If they become inflamed, they can cause itching, soreness, or burning.  Removal of a seborrheic keratosis can be accomplished by freezing, burning, or surgery. ?If any spot bleeds, scabs, or grows rapidly, please return to have it checked, as these can be an indication of a skin cancer. ? ? ? ? ?Gentle Skin Care Guide ? ?1. Bathe no more than once a day. ? ?2. Avoid bathing in hot water ? ?3. Use a mild soap like Dove, Vanicream, Cetaphil, CeraVe. Can use Lever 2000 or Cetaphil antibacterial soap ? ?4. Use soap only where you need it. On most days, use it under your arms, between your legs, and on your feet. Let the water rinse other areas unless visibly dirty. ? ?5. When you get out of the bath/shower, use a towel to gently blot your skin dry, don't rub it. ? ?6. While your skin is still a little damp, apply a moisturizing cream such as Vanicream, CeraVe, Cetaphil, Eucerin, Sarna lotion or plain Vaseline Jelly. For hands apply Neutrogena [place] Hand Cream or Excipial Hand Cream. ? ?7. Reapply moisturizer any time you start to itch or feel dry. ? ?8. Sometimes using free and clear laundry detergents can be helpful. Fabric softener sheets should be avoided. Downy Free & Gentle liquid, or any liquid fabric softener that is free of dyes and perfumes, it acceptable to use ? ?9. If your doctor has given you prescription creams you may apply  moisturizers over them  ? ? ? ?Melanoma ABCDEs ? ?Melanoma is the most dangerous type of skin cancer, and is the leading cause of death from skin disease.  You are more likely to develop melanoma if you: ?Have light-colored skin, light-colored eyes, or red or blond hair ?Spend a lot of time in the sun ?Tan regularly, either outdoors or in a tanning bed ?Have had blistering sunburns, especially during childhood ?Have a close family member who has had a melanoma ?Have atypical moles or large birthmarks ? ?Early detection of melanoma is key since treatment is typically straightforward and cure rates are extremely high if we catch it early.  ? ?The first sign of melanoma is often a change in a mole or a new dark spot.  The ABCDE system is a way of remembering the signs  of melanoma. ? ?A for asymmetry:  The two halves do not match. ?B for border:  The edges of the growth are irregular. ?C for color:  A mixture of colors are present instead of an even brown color. ?D for diameter:  Melanomas are usually (but not always) greater than 52m - the size of a pencil eraser. ?E for evolution:  The spot keeps changing in size, shape, and color. ? ?Please check your skin once per month between visits. You can use a small mirror in front and a large mirror behind you to keep an eye on the back side or your body.  ? ?If you see any new or changing lesions before your next follow-up, please call to schedule a visit. ? ?Please continue daily skin protection including broad spectrum sunscreen SPF 30+ to sun-exposed areas, reapplying every 2 hours as needed when you're outdoors.  ? ?Staying in the shade or wearing long sleeves, sun glasses (UVA+UVB protection) and wide brim hats (4-inch brim around the entire circumference of the hat) are also recommended for sun protection.   ? ? ?If You Need Anything After Your Visit ? ?If you have any questions or concerns for your doctor, please call our main line at [phone] press option 4  to reach your doctor's medical assistant. If no one answers, please leave a voicemail as directed and we will return your call as soon as possible. Messages left after 4 pm will be answered the following business day.  ?

## 2022-03-14 NOTE — Progress Notes (Signed)
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  ? ?Total Surface Area: 68cm2 ?Total Energy: 47.53J ?Service Code: [phone] ? ?Johnsie Kindred, RMA ? ?Documentation: I have reviewed the above documentation for accuracy and completeness, and I agree with the above. ? ?Brendolyn Patty MD  ? ?

## 2022-03-14 NOTE — Progress Notes (Signed)
? ?Follow-Up Visit ?  ?Subjective  ?Andres Mullins is a 71 y.o. male who presents for the following: psoriasis vulgaris (1 month follow up using clobetasol and xtrac. Patient reports improving. ) and Other (Patient reports spot at right scalp and right side /). He has multiple itchy bumps on back. ? ?The patient presents for Upper Body Skin Exam (UBSE) for skin cancer screening and mole check.  The patient has spots, moles and lesions to be evaluated, some may be new or changing and the patient has concerns that these could be cancer.  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 ? ? ?The following portions of the chart were reviewed this encounter and updated as appropriate:   ?  ? ?Review of Systems: No other skin or systemic complaints except as noted in HPI or Assessment and Plan. ? ? ?Objective  ?Well appearing patient in no apparent distress; mood and affect are within normal limits. ? ?All skin waist up examined. ? ?b/l lower legs ?Dark pink smooth patches on b/l pretibia and calves and mild xerosis on legs, with few scattered pink scaly patches ? ?back x 11 (11) ?Erythematous stuck-on, waxy papule or plaque ? ?right lower axilla ?1.2 cm flesh brown pedunculated papule  ? ? ? ? ? ? ?face ?Mild erythema with telangectasia at malar cheeks ? ? ?Assessment & Plan  ?Psoriasis vulgaris ?b/l lower legs ? ?Chronic and persistent condition with duration or expected duration over one year. Condition is improving on xtrac laser and topical treatment, but not at goal ?  ?Psoriasis is a chronic non-curable, but treatable genetic/hereditary disease that may have other systemic features affecting other organ systems such as joints (Psoriatic Arthritis). It is associated with an increased risk of inflammatory bowel disease, heart disease, non-alcoholic fatty liver disease, and depression.   ?  ?Continue clobetasol cream apply to pink scaly patches on legs qd/bid until clear dsp  60g 2Rf. Avoid face, groin, axilla.  ?  ?Switch to hydrocortisone valerate 0.2% cream to pink smooth areas until clear. Pt has at home.  ?  ?Continue Xtrac laser until all plaques resolved.  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 ?  ?Topical steroids (such as triamcinolone, fluocinolone, fluocinonide, mometasone, clobetasol, halobetasol, betamethasone, hydrocortisone) can cause thinning and lightening of the skin if they are used for too long in the same area. Your physician has selected the right strength medicine for your problem and area affected on the body. Please use your medication only as directed by your physician to prevent side effects.  ?  ?  ? ? ?Inflamed seborrheic keratosis (11) ?back x 11 ? ? ? ?Destruction of lesion - back x 11 ? ?Destruction method: cryotherapy   ?Informed consent: discussed and consent obtained   ?Timeout:  patient name, date of birth, surgical site, and procedure verified ?Lesion destroyed using liquid nitrogen: Yes   ?Region frozen until ice ball extended beyond lesion: Yes   ?Outcome: patient tolerated procedure well with no complications   ?Post-procedure details: wound care instructions given   ?Additional details:  Prior to procedure, discussed risks of blister formation, small wound, skin dyspigmentation, or rare scar following cryotherapy. Recommend Vaseline ointment to treated areas while healing. ? ? ?Neoplasm of uncertain behavior ?right lower axilla ? ?Epidermal / dermal shaving ? ?Lesion diameter (cm):  1.2 ?Informed consent: discussed and consent obtained   ?Patient was prepped and draped in  usual sterile fashion: Area prepped with alcohol. ?Anesthesia: the lesion was anesthetized in a standard fashion   ?Anesthetic:  1% lidocaine w/ epinephrine 1-100,000 buffered w/ 8.4% NaHCO3 ?Instrument used: flexible razor blade   ?Hemostasis achieved with: pressure, aluminum chloride and electrodesiccation   ?Outcome: patient tolerated  procedure well   ?Post-procedure details: wound care instructions given   ?Post-procedure details comment:  Ointment and small bandage applied.  ? ?Specimen 1 - Surgical pathology ?Differential Diagnosis: R/o inflamed seborrheic keratosis vs inflamed acrochordon ? ?Check Margins: No ? ?R/o inflamed seborrheic keratosis vs inflamed acrochordon  ? ?Rosacea ?face ? ?Rosacea is a chronic progressive skin condition usually affecting the face of adults, causing redness and/or acne bumps. It is treatable but not curable. It sometimes affects the eyes (ocular rosacea) as well. It may respond to topical and/or systemic medication and can flare with stress, sun exposure, alcohol, exercise and some foods.  Daily application of broad spectrum spf 30+ sunscreen to face is recommended to reduce flares. ? ?Continue metronidazole gel qd as needed  ? ? ?History of PreCancerous Actinic Keratosis  ?- site(s) of PreCancerous Actinic Keratosis clear today at right temple  ?- these may recur and new lesions may form requiring treatment to prevent transformation into skin cancer ?- observe for new or changing spots and contact [place] for appointment if occur ?- photoprotection with sun protective clothing; sunglasses and broad spectrum sunscreen with SPF of at least 30 + and frequent self skin exams recommended ?- yearly exams by a dermatologist recommended for persons with history of PreCancerous Actinic Keratoses ? ?Xerosis ?- diffuse xerotic patches at legs  ?- recommend gentle, hydrating skin care ?- gentle skin care handout given ? ?Lentigines ?- Scattered tan macules ?- Due to sun exposure ?- Benign-appearing, observe ?- Recommend daily broad spectrum sunscreen SPF 30+ to sun-exposed areas, reapply every 2 hours as needed. ?- Call for any changes ? ?Seborrheic Keratoses ?- Stuck-on, waxy, tan-brown papules and/or plaques  ?- Benign-appearing ?- Discussed benign etiology and prognosis. ?- Observe ?- Call for any  changes ? ?Melanocytic Nevi ?- Tan-brown and/or pink-flesh-colored symmetric macules and papules ?- Benign appearing on exam today ?- Observation ?- Call clinic for new or changing moles ?- Recommend daily use of broad spectrum spf 30+ sunscreen to sun-exposed areas.  ? ?Hemangiomas ?- Red papules ?- Discussed benign nature ?- Observe ?- Call for any changes ? ?Actinic Damage ?- Chronic condition, secondary to cumulative UV/sun exposure ?- diffuse scaly erythematous macules with underlying dyspigmentation ?- Recommend daily broad spectrum sunscreen SPF 30+ to sun-exposed areas, reapply every 2 hours as needed.  ?- Staying in the shade or wearing long sleeves, sun glasses (UVA+UVB protection) and wide brim hats (4-inch brim around the entire circumference of the hat) are also recommended for sun protection.  ?- Call for new or changing lesions. ? ?Skin cancer screening performed today. ? ?Return in about 6 months (around 09/13/2022) for tbse, psoriasis f/u, pt will be on chemo med, increase risk skin cancer. ? ?I, Ruthell Rummage, CMA, am acting as scribe for Brendolyn Patty, MD. ? ?Documentation: I have reviewed the above documentation for accuracy and completeness, and I agree with the above. ? ?Brendolyn Patty MD  ? ?

## 2022-03-14 NOTE — Addendum Note (Signed)
Addended by: Vanice Sarah on: 03/14/2022 08:50 AM ? ? Modules accepted: Orders ? ?

## 2022-03-15 LAB — IMMUNOGLOBULINS A/E/G/M, SERUM
IgA: 65 mg/dL (ref 61–437)
IgE (Immunoglobulin E), Serum: 9 IU/mL (ref 6–495)
IgG (Immunoglobin G), Serum: 537 mg/dL — ABNORMAL LOW (ref 603–1613)
IgM (Immunoglobulin M), Srm: 16 mg/dL — ABNORMAL LOW (ref 20–172)

## 2022-03-15 NOTE — Progress Notes (Signed)
Patient on schedule for Port removal with patient called with instructions given. Made aware to be here @ 0800 ,NPO after MN prior to procedure and driver post procedure/recovery. Stated understanding.  ?

## 2022-03-16 ENCOUNTER — Ambulatory Visit (INDEPENDENT_AMBULATORY_CARE_PROVIDER_SITE_OTHER): Payer: Medicare PPO | Admitting: Dermatology

## 2022-03-16 ENCOUNTER — Telehealth: Payer: Self-pay

## 2022-03-16 DIAGNOSIS — L400 Psoriasis vulgaris: Secondary | ICD-10-CM

## 2022-03-16 NOTE — Telephone Encounter (Signed)
Advised pt of bx result/sh ?

## 2022-03-16 NOTE — Progress Notes (Signed)
Patient here today for xtrac treatment for psoriasis vulgaris.  ? ?Total Surface Area: 68cm2 ?Total Energy: 54.60J ?Service Code: [id number] ? ?Johnsie Kindred, RMA ? ?Documentation: I have reviewed the above documentation for accuracy and completeness, and I agree with the above. ? ?Brendolyn Patty MD  ? ?

## 2022-03-16 NOTE — Telephone Encounter (Signed)
-----   Message from Brendolyn Patty, MD sent at 03/15/2022  6:16 PM EDT ----- ?Skin , right lower axilla ?SEBORRHEIC KERATOSIS, IRRITATED ? ?Benign ISK ? ? - please call patient ?

## 2022-03-17 ENCOUNTER — Encounter: Payer: Self-pay | Admitting: Dermatology

## 2022-03-17 ENCOUNTER — Other Ambulatory Visit (HOSPITAL_COMMUNITY): Payer: Self-pay | Admitting: Radiology

## 2022-03-20 ENCOUNTER — Ambulatory Visit: Payer: Medicare PPO

## 2022-03-20 ENCOUNTER — Encounter: Payer: Self-pay | Admitting: Radiology

## 2022-03-20 ENCOUNTER — Other Ambulatory Visit: Payer: Self-pay

## 2022-03-20 ENCOUNTER — Ambulatory Visit
Admission: RE | Admit: 2022-03-20 | Discharge: 2022-03-20 | Disposition: A | Discharge status: Home / Self Care | Payer: Medicare PPO | Source: Ambulatory Visit | Attending: Internal Medicine | Admitting: Internal Medicine

## 2022-03-20 DIAGNOSIS — Z9221 Personal history of antineoplastic chemotherapy: Secondary | ICD-10-CM | POA: Insufficient documentation

## 2022-03-20 DIAGNOSIS — Z452 Encounter for adjustment and management of vascular access device: Secondary | ICD-10-CM | POA: Insufficient documentation

## 2022-03-20 DIAGNOSIS — C9110 Chronic lymphocytic leukemia of B-cell type not having achieved remission: Secondary | ICD-10-CM

## 2022-03-20 DIAGNOSIS — Z856 Personal history of leukemia: Secondary | ICD-10-CM | POA: Insufficient documentation

## 2022-03-20 HISTORY — PX: IR REMOVAL TUN ACCESS W/ PORT W/O FL MOD SED: IMG2290

## 2022-03-20 MED ORDER — SODIUM CHLORIDE 0.9 % IV SOLN
INTRAVENOUS | Status: DC
  Filled 2022-03-20: qty 1000

## 2022-03-20 MED ORDER — MIDAZOLAM HCL 2 MG/2ML IJ SOLN
INTRAMUSCULAR | Status: AC | PRN
  Administered 2022-03-20: 1 mg via INTRAVENOUS

## 2022-03-20 MED ORDER — LIDOCAINE-EPINEPHRINE 1 %-1:100000 IJ SOLN
INTRAMUSCULAR | Status: AC
Start: 2022-03-20 — End: 2022-03-20
  Administered 2022-03-20: 6 mL
  Filled 2022-03-20: qty 1

## 2022-03-20 MED ORDER — FENTANYL CITRATE (PF) 100 MCG/2ML IJ SOLN
INTRAMUSCULAR | Status: AC | PRN
  Administered 2022-03-20: 50 ug via INTRAVENOUS

## 2022-03-20 MED ORDER — LIDOCAINE-EPINEPHRINE (PF) 2 %-1:200000 IJ SOLN
INTRAMUSCULAR | Status: AC
  Filled 2022-03-20: qty 20

## 2022-03-20 MED ORDER — FENTANYL CITRATE (PF) 100 MCG/2ML IJ SOLN
INTRAMUSCULAR | Status: AC
  Filled 2022-03-20: qty 2

## 2022-03-20 MED ORDER — MIDAZOLAM HCL 2 MG/2ML IJ SOLN
INTRAMUSCULAR | Status: AC
  Filled 2022-03-20: qty 4

## 2022-03-20 NOTE — Procedures (Signed)
Interventional Radiology Procedure Note ? ?Procedure: Chest port removal ? ?Indication: Completion of chemotherapy for CLL ? ?Findings: Please refer to procedural dictation for full description. ? ?Complications: None ? ?EBL: < 10 mL ? ?Miachel Roux, MD ?[phone] ? ? ?

## 2022-03-20 NOTE — H&P (Addendum)
? ?Chief Complaint: ?Patient was seen in consultation today for CLL port a catheter no longer needed at the request of Andres Mullins ? ?Referring Physician(s): ?Andres Mullins ? ?Supervising Physician: Andres Mullins ? ?Patient Status: [place] ? ?History of Present Illness: ?Andres Mullins is a 72 y.o. male with pertinent PMHx significant for CLL who has underwent treatment via right IJ port that was placed in 05/2021 and IV treatment no longer needed. Patient has been seen by his oncologist on 4/17 and request received for IR Port a catheter removal.  ? ?The patient denies any current chest pain or shortness of breath. He denies any current blood thinner use, stopped his eliquis 2 days ago. The patient denies any recent infections, fever or chills. The patient denies any history of sleep apnea or chronic oxygen use. He has no known complications to sedation.  ? ?Past Medical History:  ?Diagnosis Date  ? Atherosclerosis of coronary artery 09/28/2016  ? Chest CT Nov 2017  ? Atrial tachycardia ([place])   ? CLL (chronic lymphocytic leukemia) ([place])   ? Elevated C-reactive protein (CRP)   ? Elevated PSA   ? Fractured sternum 1999  ? Herpes   ? History of rib fracture 1999  ? multiple  ? Hyperlipidemia   ? Psoriasis   ? Pulmonary embolism ([place])   ? Rheumatoid factor positive 02/2014  ? Rosacea   ? Thoracic aortic atherosclerosis ([place]) 09/28/2016  ? Chest CT Nov 2017  ? Tobacco abuse   ? Vitamin D deficiency disease   ? ? ?Past Surgical History:  ?Procedure Laterality Date  ? APPENDECTOMY  1974  ? PERICARDIOCENTESIS N/A 10/03/2021  ? Procedure: PERICARDIOCENTESIS;  Surgeon: Jettie Booze, MD;  Location: [facility];  Service: Cardiovascular;  Laterality: N/A;  ? PORTA CATH INSERTION N/A 06/06/2021  ? Procedure: PORTA CATH INSERTION;  Surgeon: Algernon Huxley, MD;  Location: [facility];  Service: Cardiovascular;  Laterality: N/A;  ? SHOULDER ARTHROSCOPY  10/23/2016  ? Procedure:  ARTHROSCOPY SHOULDER WITH DISTAL CLAVICLE EXCISION, PARTIAL ACROMIONECTOMY, AND DEBRIDEMENT;  Surgeon: Earnestine Leys, MD;  Location: [facility];  Service: Orthopedics;;  ? TIBIA FRACTURE SURGERY Left 2004  ? titanium rod, ORIF  ? ? ?Allergies: ?Gluten meal ? ?Medications: ?Prior to Admission medications   ?Medication Sig Start Date End Date Taking? Authorizing Provider  ?acyclovir (ZOVIRAX) 400 MG tablet Take 1 tablet (400 mg total) by mouth 2 (two) times daily. 10/26/21  Yes Cammie Sickle, MD  ?cholecalciferol (VITAMIN D3) 25 MCG (1000 UNIT) tablet Take 1,000 Units by mouth daily.   Yes [provider]  ?clobetasol cream (TEMOVATE) 0.05 % Apply to affected areas rash on legs once to twice daily until improved. Avoid face, groin, axilla. 02/08/22  Yes Brendolyn Patty, MD  ?Acalabrutinib Maleate 100 MG TABS Take 1 tablet (100 mg) by mouth 2 (two) times daily. 03/13/22   Cammie Sickle, MD  ?apixaban (ELIQUIS) 5 MG TABS tablet Take 1 tablet (5 mg total) by mouth 2 (two) times daily. 11/01/21   Cammie Sickle, MD  ?hydrocortisone valerate cream (WESTCORT) 0.2 % APPLY TO AFFECTED AREA TWICE A DAY 01/31/22   Mecum, Erin E, PA-C  ?oxyCODONE (OXY IR/ROXICODONE) 5 MG immediate release tablet Take 1 tablet (5 mg total) by mouth every 6 (six) hours as needed for moderate pain or severe pain. 10/18/21   Lavina Hamman, MD  ?  ? ?Family History  ?Problem Relation Age of Onset  ?  Heart disease Father   ? Osteoarthritis Father   ? Alzheimer's disease Father   ? Cancer Neg Hx   ? Diabetes Neg Hx   ? Hypertension Neg Hx   ? Stroke Neg Hx   ? COPD Neg Hx   ? ? ?Social History  ? ?Socioeconomic History  ? Marital status: Married  ?  Spouse name: colleen  ? Number of children: Not on file  ? Years of education: 45  ? Highest education level: Bachelor's degree (e.g., BA, AB, BS)  ?Occupational History  ? Occupation: retired  ?Tobacco Use  ? Smoking status: Every Day  ?  Packs/day: 0.50  ?  Years: 45.00  ?  Pack  years: 22.50  ?  Types: Cigarettes  ? Smokeless tobacco: Never  ? Tobacco comments:  ?  11/6- currently smoking 5 cigarettes a day   ?Vaping Use  ? Vaping Use: Never used  ?Substance and Sexual Activity  ? Alcohol use: Yes  ?  Alcohol/week: 3.0 standard drinks  ?  Types: 3 Glasses of wine per week  ?  Comment: a couple glasses of wine a week  ? Drug use: No  ? Sexual activity: Yes  ?Other Topics Concern  ? Not on file  ?Social History Narrative  ? Consumes ~4 cups of coffee/day  ? ?Social Determinants of Health  ? ?Financial Resource Strain: Not on file  ?Food Insecurity: Not on file  ?Transportation Needs: Not on file  ?Physical Activity: Not on file  ?Stress: Not on file  ?Social Connections: Not on file  ? ?Review of Systems: A 12 point ROS discussed and pertinent positives are indicated in the HPI above.  All other systems are negative. ? ?Review of Systems ? ?Vital Signs: ?BP 105/84   Resp (!) 23   Ht 5' 6"  (1.676 m)   Wt 165 lb (74.8 kg)   SpO2 98%   BMI 26.63 kg/m?  ? ?Physical Exam ?Constitutional:   ?   Appearance: Normal appearance.  ?Cardiovascular:  ?   Rate and Rhythm: Normal rate and regular rhythm.  ?Pulmonary:  ?   Effort: Pulmonary effort is normal. No respiratory distress.  ?   Breath sounds: Normal breath sounds.  ?Skin: ?   General: Skin is warm and dry.  ?   Comments: Right chest wall port intact without signs of infection, well healed  ?Neurological:  ?   Mental Status: He is alert and oriented to person, place, and time.  ? ? ?Imaging: ?No results found. ? ?Labs: ? ?CBC: ?Recent Labs  ?  01/17/22 ?0616 01/18/22 ?0530 01/24/22 ?4765 03/13/22 ?1501  ?WBC 12.9* 9.5 9.2 9.0  ?HGB 13.7 13.7 15.2 15.3  ?HCT 39.7 39.9 44.9 44.5  ?PLT 557* 554* 576* 336  ? ? ?COAGS: ?Recent Labs  ?  10/02/21 ?2125 10/16/21 ?2022 01/16/22 ?1203  ?INR 1.3* 1.0 1.4*  ?APTT  --  32  --   ? ? ?BMP: ?Recent Labs  ?  01/17/22 ?0616 01/18/22 ?0530 01/24/22 ?4650 03/13/22 ?1501  ?NA 134* 137 136 134*  ?K 4.0 3.9 4.2 3.7   ?CL 101 102 101 103  ?CO2 22 24 24 25   ?GLUCOSE 131* 105* 98 121*  ?BUN 11 15 13 10   ?CALCIUM 9.0 9.0 9.9 9.3  ?CREATININE 0.74 0.72 0.77 0.85  ?GFRNONAA >60 >60 >60 >60  ? ? ?LIVER FUNCTION TESTS: ?Recent Labs  ?  01/17/22 ?1704 01/18/22 ?0530 01/24/22 ?3546 03/13/22 ?1501  ?BILITOT 0.3 0.4 0.1* 0.4  ?  AST 14* 12* 23 20  ?ALT 15 14 24 15   ?ALKPHOS 71 64 79 76  ?PROT 6.8 6.3* 7.1 6.8  ?ALBUMIN 3.1* 2.8* 3.6 3.8  ? ? ?Assessment and Plan: ?This is a 71 year old male with pertinent PMHx significant for CLL who has underwent treatment via right IJ port that was placed in 05/2021 and IV treatment no longer needed. Patient has been seen by his oncologist on 4/17 and request received for IR Port a catheter removal.  ? ?The patient has been NPO, no blood thinners taken, labs and vitals have been reviewed. ? ?Risks and benefits of image guided port-a-catheter placement was discussed with the patient including, but not limited to bleeding, infection, damage to adjacent structures. ? ?All of the patient's questions were answered, patient is agreeable to proceed. ?Consent signed and in chart. ? ? ?Thank you for this interesting consult.  I greatly enjoyed meeting NAOKI MIGLIACCIO and look forward to participating in their care.  A copy of this report was sent to the requesting provider on this date. ? ?Electronically Signed: ?Tsosie Billing D, PA-C ?03/20/2022, 9:03 AM ? ? ?I spent a total of 15 Minutes in face to face in clinical consultation, greater than 50% of which was counseling/coordinating care for CLL, port no longer needed. ? ?

## 2022-03-20 NOTE — Discharge Instructions (Signed)
Implanted Port Removal, Care After ?Refer to this sheet in the next few weeks. These instructions provide you with information about caring for yourself after your procedure. Your health care provider may also give you more specific instructions. Your treatment has been planned according to current medical practices, but problems sometimes occur. Call your health care provider if you have any problems or questions after your procedure. ?What can I expect after the procedure? ?After the procedure, it is common to have: ?Soreness or pain near your incision. ?Some swelling or bruising near your incision. ? ?Follow these instructions at home: ?Medicines ?Take over-the-counter and prescription medicines only as told by your health care provider. ?If you were prescribed an antibiotic medicine, take it as told by your health care provider. Do not stop taking the antibiotic even if you start to feel better. ?Bathing ?You may shower tomorrow.  ?Incision care ?You have an incision with sutures that are dissolvable and skin glue over top incision.  This skin glue will wear off over time.  Do not peel or try to remove this yourself. Keep your dressing dry. ?Check your incision area every day for signs of infection and if present call your doctor and let them know. Check for: ?More redness, swelling, or pain. ?More fluid or blood. ?Warmth. ?Pus or a bad smell. ?Driving ?If you received a sedative, do not drive for 24 hours after the procedure. ?If you did not receive a sedative, ask your health care provider when it is safe to drive. ?Activity ?Return to your normal activities as told by your health care provider. Ask your health care provider what activities are safe for you. ?Until your health care provider says it is safe: ?Do not lift anything that is heavier than 10 lb (4.5 kg). ?Do not do activities that involve lifting your arms over your head. ?General instructions ?Do not use any tobacco products, such as cigarettes,  chewing tobacco, and e-cigarettes. Tobacco can delay healing. If you need help quitting, ask your health care provider. ?Keep all follow-up visits as told by your health care provider. This is important. ?Contact a health care provider if: ?You have more redness, swelling, or pain around your incision. ?You have more fluid or blood coming from your incision. ?Your incision feels warm to the touch. ?You have pus or a bad smell coming from your incision. ?You have a fever. ?You have pain that is not relieved by your pain medicine. ?Get help right away if: ?You have chest pain. ?You have difficulty breathing. ?This information is not intended to replace advice given to you by your health care provider. Make sure you discuss any questions you have with your health care provider. ?Document Released: 10/25/2015 Document Revised: 04/20/2016 Document Reviewed: 08/18/2015 ?Elsevier Interactive Patient Education ? 2018 [employer].  ?

## 2022-03-21 ENCOUNTER — Other Ambulatory Visit (HOSPITAL_COMMUNITY): Payer: Self-pay

## 2022-03-21 NOTE — Telephone Encounter (Signed)
Oral Oncology Patient Advocate Encounter ? ?I spoke with Andres Mullins this afternoon to set up delivery of Calqu[place].  Address verified for shipment.  Calquence will be filled through [facility] and mailed 4/25 for delivery 4/26.  Patient is aware to not start medication and has an appointment with the pharmacist next week to do education.   ? ?[facility] will call 7-10 days before next refill is due to complete adherence call and set up delivery of medication.    ? ?Dennison Nancy CPHT ?Specialty Pharmacy Patient Advocate ?[place] ?Phone [phone] ?Fax [phone] ?03/21/2022 3:38 PM ? ? ?

## 2022-03-22 ENCOUNTER — Ambulatory Visit (INDEPENDENT_AMBULATORY_CARE_PROVIDER_SITE_OTHER): Payer: Medicare PPO | Admitting: Dermatology

## 2022-03-22 DIAGNOSIS — L400 Psoriasis vulgaris: Secondary | ICD-10-CM | POA: Diagnosis not present

## 2022-03-22 NOTE — Progress Notes (Signed)
Patient here today for xtrac treatment of psoriasis vulgaris.  ? ?Total Surface Area: 60cm2 ?Total Energy: 48.18J ?Service Code: [id number] ? ?Johnsie Kindred, RMA ? ?Documentation: I have reviewed the above documentation for accuracy and completeness, and I agree with the above. ? ?Brendolyn Patty MD  ? ?

## 2022-03-24 ENCOUNTER — Other Ambulatory Visit: Payer: Self-pay

## 2022-03-24 DIAGNOSIS — C9110 Chronic lymphocytic leukemia of B-cell type not having achieved remission: Secondary | ICD-10-CM

## 2022-03-27 ENCOUNTER — Inpatient Hospital Stay (HOSPITAL_BASED_OUTPATIENT_CLINIC_OR_DEPARTMENT_OTHER): Payer: Medicare PPO | Admitting: Internal Medicine

## 2022-03-27 ENCOUNTER — Inpatient Hospital Stay: Payer: Medicare PPO | Attending: Internal Medicine

## 2022-03-27 ENCOUNTER — Encounter: Payer: Self-pay | Admitting: Internal Medicine

## 2022-03-27 ENCOUNTER — Inpatient Hospital Stay: Payer: Medicare PPO | Admitting: Pharmacist

## 2022-03-27 ENCOUNTER — Ambulatory Visit (INDEPENDENT_AMBULATORY_CARE_PROVIDER_SITE_OTHER): Payer: Medicare PPO | Admitting: Dermatology

## 2022-03-27 DIAGNOSIS — C9110 Chronic lymphocytic leukemia of B-cell type not having achieved remission: Secondary | ICD-10-CM | POA: Insufficient documentation

## 2022-03-27 DIAGNOSIS — R5383 Other fatigue: Secondary | ICD-10-CM | POA: Insufficient documentation

## 2022-03-27 DIAGNOSIS — Z86711 Personal history of pulmonary embolism: Secondary | ICD-10-CM | POA: Diagnosis not present

## 2022-03-27 DIAGNOSIS — L400 Psoriasis vulgaris: Secondary | ICD-10-CM

## 2022-03-27 DIAGNOSIS — I2510 Atherosclerotic heart disease of native coronary artery without angina pectoris: Secondary | ICD-10-CM | POA: Diagnosis not present

## 2022-03-27 DIAGNOSIS — Z8249 Family history of ischemic heart disease and other diseases of the circulatory system: Secondary | ICD-10-CM | POA: Diagnosis not present

## 2022-03-27 DIAGNOSIS — E785 Hyperlipidemia, unspecified: Secondary | ICD-10-CM | POA: Diagnosis not present

## 2022-03-27 DIAGNOSIS — Z8261 Family history of arthritis: Secondary | ICD-10-CM | POA: Diagnosis not present

## 2022-03-27 DIAGNOSIS — Z818 Family history of other mental and behavioral disorders: Secondary | ICD-10-CM | POA: Diagnosis not present

## 2022-03-27 DIAGNOSIS — Z9049 Acquired absence of other specified parts of digestive tract: Secondary | ICD-10-CM | POA: Insufficient documentation

## 2022-03-27 DIAGNOSIS — F17210 Nicotine dependence, cigarettes, uncomplicated: Secondary | ICD-10-CM | POA: Insufficient documentation

## 2022-03-27 DIAGNOSIS — Z7901 Long term (current) use of anticoagulants: Secondary | ICD-10-CM | POA: Insufficient documentation

## 2022-03-27 DIAGNOSIS — Z79899 Other long term (current) drug therapy: Secondary | ICD-10-CM | POA: Diagnosis not present

## 2022-03-27 LAB — CBC WITH DIFFERENTIAL/PLATELET
Abs Immature Granulocytes: 0.03 10*3/uL (ref 0.00–0.07)
Basophils Absolute: 0.1 10*3/uL (ref 0.0–0.1)
Basophils Relative: 1 %
Eosinophils Absolute: 0.2 10*3/uL (ref 0.0–0.5)
Eosinophils Relative: 2 %
HCT: 45.3 % (ref 39.0–52.0)
Hemoglobin: 15.6 g/dL (ref 13.0–17.0)
Immature Granulocytes: 0 %
Lymphocytes Relative: 21 %
Lymphs Abs: 1.5 10*3/uL (ref 0.7–4.0)
MCH: 30.1 pg (ref 26.0–34.0)
MCHC: 34.4 g/dL (ref 30.0–36.0)
MCV: 87.5 fL (ref 80.0–100.0)
Monocytes Absolute: 0.8 10*3/uL (ref 0.1–1.0)
Monocytes Relative: 11 %
Neutro Abs: 4.7 10*3/uL (ref 1.7–7.7)
Neutrophils Relative %: 65 %
Platelets: 289 10*3/uL (ref 150–400)
RBC: 5.18 MIL/uL (ref 4.22–5.81)
RDW: 14.6 % (ref 11.5–15.5)
WBC: 7.4 10*3/uL (ref 4.0–10.5)
nRBC: 0 % (ref 0.0–0.2)

## 2022-03-27 LAB — COMPREHENSIVE METABOLIC PANEL
ALT: 18 U/L (ref 0–44)
AST: 18 U/L (ref 15–41)
Albumin: 3.9 g/dL (ref 3.5–5.0)
Alkaline Phosphatase: 75 U/L (ref 38–126)
Anion gap: 6 (ref 5–15)
BUN: 9 mg/dL (ref 8–23)
CO2: 28 mmol/L (ref 22–32)
Calcium: 9.3 mg/dL (ref 8.9–10.3)
Chloride: 102 mmol/L (ref 98–111)
Creatinine, Ser: 0.86 mg/dL (ref 0.61–1.24)
GFR, Estimated: >60 mL/min (ref >60)
Glucose, Bld: 81 mg/dL (ref 70–99)
Potassium: 3.8 mmol/L (ref 3.5–5.1)
Sodium: 136 mmol/L (ref 135–145)
Total Bilirubin: 0.7 mg/dL (ref 0.3–1.2)
Total Protein: 6.6 g/dL (ref 6.5–8.1)

## 2022-03-27 LAB — LACTATE DEHYDROGENASE: LDH: 124 U/L (ref 98–192)

## 2022-03-27 NOTE — Progress Notes (Signed)
? ?[facility] ?[place]  ?[phone] [phone] ? ?Patient Care Team: ?[name], PA-C as PCP - General (Family Medicine) ?[name], MD as PCP - Cardiology (Cardiology) ?Arnetha Courser, MD as Attending Physician (Family Medicine) ?Brendolyn Patty, MD (Dermatology) ?Billey Co, MD as Consulting Physician (Urology) ?Cammie Sickle, MD as Consulting Physician (Hematology and Oncology)  ? ?Name of the patient: Andres Mullins  ?[id number]  ?[birth year]  ? ?Date of visit: 03/27/22 ? 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 Patient was previuosly treated with obinutuzumab and venetoclax, but treatment was stopped due to intolerance. Planned to switch patient to treatment with [place] (acalabrutinib). ? ?Reason for Consult: Acalabrutinib oral chemotherapy education. ? ? ?PAST MEDICAL HISTORY: ?Past Medical History:  ?Diagnosis Date  ? Atherosclerosis of coronary artery 09/28/2016  ? Chest CT Nov 2017  ? Atrial tachycardia ([place])   ? CLL (chronic lymphocytic leukemia) ([place])   ? Elevated C-reactive protein (CRP)   ? Elevated PSA   ? Fractured sternum 1999  ? Herpes   ? History of rib fracture 1999  ? multiple  ? Hyperlipidemia   ? Psoriasis   ? Pulmonary embolism ([place])   ? Rheumatoid factor positive 02/2014  ? Rosacea   ? Thoracic aortic atherosclerosis ([place]) 09/28/2016  ? Chest CT Nov 2017  ? Tobacco abuse   ? Vitamin D deficiency disease   ? ? ?HEMATOLOGY/ONCOLOGY HISTORY:  ?Oncology History Overview Note  ?# 2016- CLL-[flow] CD5 (+)/CD23 (+) clonal B-cell population, CLL/SLL phenotype, 18% of leukocytes, <5,000/uL, CD38 positive  ? ?# NOV 2020 PET [incidental]- Generalized Lymphadenopathy- left supraclav/ bil ax LN L> R; mesenteric LN; pelvic LN; FEB 5th 2021- 2.5cm left supraclav; Left ax LN- 2.4cm.  ? ?AUG 2021-Head and neck: The brain demonstrates symmetric appearing diffuse physiologic uptake without focal finding to suggest abnormality. In the  left posterior and medial maxillary cavity, there is an area of mucoid deposition seen within SUV max of 28.8. This was seen in the previous study of November. The specific bony erosion, clear-cut soft tissue lesion is not detected. This probably represents a chronic sinus disease focus. The bilateral optic nerves and extraocular muscles to the largest nodes are seen on axial image 98. 1 now demonstrates a short axis measurement of 3.3 cm and the other more medially, a short axis measurement of 2.5 cm. Demonstrate symmetric increased uptake. The left, much greater than right, jugulodigastric and supraclavicular nodes have increased significantly in size and avidity since the previous study. Both of these lymph nodes demonstrate an SUV max of 3.9. No largest lymph node in the right subclavicular space is seen on axial image 104, demonstrating an SUV max of 3.3, and a short axis measurement of 1.4 cm.  ? ?On axial image 102, a right paratracheal lymph node is again identified with increased avidity, located immediately posterior to the right thyroid pole. This area was noted previously, but now increased avidity is seen, with an SUV max of 8.6 in this area. Multiple smaller, and less avid lymph nodes scattered throughout the left greater than right neck are seen, as well as supraclavicular and infraclavicular areas.  ? ?Thorax: Significant axillary lymph nodes have increased significantly in size. These are also left greater than right, with the largest on the left seen on image #114 with a short axis measurement of 2.2 cm and an SUV max of 4.3. The largest on the right as seen on image 111, demonstrating a short axis  of 1.4 cm and an SUV max of 4.0. Too numerous to count smaller and similarly avid lymph nodes in both axillae are also noted. These are all larger than previously seen and in general, more avid.  ?Numerous small AP window and lesser mediastinal lymph nodes are seen with minimal increased avidity,  appearing similar in size and avidity to the previous study. No other significant mediastinal adenopathy. No hilar adenopathy detected. These likely also represent lymphoproliferative disease.  ?Hyperinflation of COPD, bibasilar dependent atelectasis, some paucity of lung tissue and biapical scarring are noted without other focal pulmonary mass or nodule, or acute pulmonary parenchymal disease. Physiologic uptake in the left heart is seen, as expected. Small hiatal hernia is again identified.  ? ?Abdomen pelvis: Physiologic uptake in the liver, gut and urinary tract is again noted. No focal area of asymmetric uptake in the structures is seen. The spleen also demonstrates generalized increased uptake and stable splenomegaly, up to 14.2 cm. On image 198, a preaortic lymph node is detected of increased size since the previous study, now measuring up to 1.2 cm short axis.  ?Several large lymph nodes on image 203 of the mesentery are noted. The largest of these, on image 201 and ureters up to 1.8 cm short axis. Despite the fact that measurable avidity is not detected in these are the surrounding lymph nodes, these are doubtless involved in the lymphoproliferative disease described, and appear larger. Multiple smaller lymph nodes scattered throughout the mesentery are also seen.  ?Grossly enlarged right greater than left common iliac chain lymph nodes are again seen, throughout the pelvis and into the inguinal areas bilaterally. The most avid on the right is seen on image #260 to, demonstrates a short axis of 2.5 cm, and an SUV max of 4.9. The largest on the left is identified image #272, demonstrates a short axis of 2.0 cm, and an SUV max of 4.7. These are similar to the previously noted values.  ?Right greater than left inguinal adenopathy is seen, with the largest lymph node seen on the right image 276, demonstrating a short axis of 1.5 cm and an SUV max of 3.9. This is larger in size and avidity than previously seen.  There are bilateral enlarged lymph nodes associated with the external iliac vessels on image 277. These measure 2.0 cm on the right in short axis, with an SUV max of 3.9.  ?On the left, image 278, the external iliac lymph node is larger at 1.8 cm short axis, but with an SUV max of only 3.8. Lesser avid and smaller lymph nodes on both sides are also detected.  ? ?Musculoskeletal: Scattered, insignificant uptake is detected in the muscles especially in the bilateral joints, without focal finding to suggest lymphoproliferative disease.  ? ?IMPRESSIONS:  ?Significant increase in numbers, short axis size, and to a large extent, avidity -in the lymph nodes of the left greater than right neck, supraclavicular and infraclavicular areas, bilateral axillary lymph nodes and common iliac chain, external iliac chain and inguinal lymph nodes, consistent with worsening lymphoproliferative disease. The mediastinal lymph nodes seen are still present and unchanged in size or [significantly] in avidity.    [place]- Bx- SLL/CLL.July 2022-Discussed with Dr. Asquith-oncologist, [place]-reviewed the pathology of the lymph node biopsy in 2021-positive for SLL; negative for cyclin D1/1114 translocation.   ? ?# July 20th, 2022- GAZYVA #1 Aletha Halim plan ading VENATOCLAX after 2 cycles/de-bulking] s/p GAYZYVA [x6]- Feb 2023 - cycle #1  Venatoclax- x1 month- HELD sec to repeated hospitalization ? ?#  DISCONTINUED VENATOCLAX [sec to intolearnce] ? ?# MAY 1st, 2023- START ACALABRUTINIB 100 mg BID ? ?# December 2022-pericarditis/effusion needing pericardiocentesis; FEB 2023-acute respiratory failure-hospitalization-? Etiology [s/p ID; Pul; Dr.A; cards- [facility]] ? ?# Lung cancer screening- on [facility] ? ?# SURVIVORSHIP:  ? ?# GENETICS:  ? ?DIAGNOSIS:  ? ?STAGE:         ;  GOALS: ? ?CURRENT/MOST RECENT THERAPY :  ? ? ? ?  ?CLL (chronic lymphocytic leukemia) ([place])  ?10/29/2019 Initial Diagnosis  ? CLL (chronic lymphocytic leukemia) ([place]) ?  ?06/15/2021 -   Chemotherapy  ? Patient is on Treatment Plan : LYMPHOMA CLL/SLL Venetoclax + Obinutuzumab q28d  ? ?  ?  ? ? ?ALLERGIES:  is allergic to gluten meal. ? ?MEDICATIONS:  ?Current Outpatient Medications  ?Medication Sig Disp

## 2022-03-27 NOTE — Progress Notes (Signed)
Patient here today for xtrac treatment of Psoriasis Vulgaris.  ? ?Total Surface Area: 40cm2 ?Total Energy: 32.12J ?Service Code: [id number] ? ?Johnsie Kindred, RMA ? ?Documentation: I have reviewed the above documentation for accuracy and completeness, and I agree with the above. ? ?Brendolyn Patty MD  ? ?

## 2022-03-27 NOTE — Assessment & Plan Note (Addendum)
#   SLL/CLL- Patient currently s/p cycle #6 of Gazyva [#6 in Jan 2023]; Currently on venatoclax hold.  Recommend discontinuation of venatolax because of multiple hospitalizations [multiple hospitalizations pericarditis pericardial effusions/respiratory issues]. ? ?#Recommend proceeding with with acalabrutinib 100 mg twice a day.  Discussed with Ebony Hail; educated patient.  Will reevaluate in approximately 1 month. ? ?# Repeated acute respiratory failure-2022 pericardial effusion/pericarditis- Most likely related to induced Gazyva/venetoclax.  Quantitative immunoglobulins above 500. ? ?# PE acute [NOV 0698] provoked--currently s/p  Eliquis x 6 months [end of  April 2023].   ? ?# Continue  shingles/TLS prophylaxis: Acyclovir- [stop in July 2023; 6 months from [facility] January, 2023]- STABLE.  ? ?# Mediport placement: explanted  ? ?# DISPOSITION: ?# follow up in 1 months- MD; labs- cbc/cmp:[facility]-Dr.B ?  ? ?  ? ? ? ?

## 2022-03-27 NOTE — Progress Notes (Signed)
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   AUG 2021-Head and neck: The brain demonstrates symmetric appearing diffuse physiologic uptake without focal finding to suggest abnormality. In the left posterior and medial maxillary cavity, there is an area of mucoid deposition seen within SUV max of 28.8. This was seen in the previous study of November. The specific bony erosion, clear-cut soft tissue lesion is not detected. This probably represents a chronic sinus disease focus. The bilateral optic nerves and extraocular muscles to the largest nodes are seen on axial image 98. 1 now demonstrates a short axis measurement of 3.3 cm and the other more medially, a short axis measurement of 2.5 cm. Demonstrate symmetric increased uptake. The left, much greater than right, jugulodigastric and supraclavicular nodes have increased significantly in size and avidity since the previous study. Both of these lymph nodes demonstrate an SUV max of 3.9. No largest lymph node in the right subclavicular space is seen on axial image 104, demonstrating an SUV max of 3.3, and a short axis measurement of 1.4 cm.   On axial image 102, a right paratracheal lymph node is again identified with increased avidity,  located immediately posterior to the right thyroid pole. This area was noted previously, but now increased avidity is seen, with an SUV max of 8.6 in this area. Multiple smaller, and less avid lymph nodes scattered throughout the left greater than right neck are seen, as well as supraclavicular and infraclavicular areas.   Thorax: Significant axillary lymph nodes have increased significantly in size. These are also left greater than right, with the largest on the left seen on image #114 with a short axis measurement of 2.2 cm and an SUV max of 4.3. The largest on the right as seen on image 111, demonstrating a short axis of 1.4 cm and an SUV max of 4.0. Too numerous to count smaller and similarly avid lymph nodes in both axillae are also noted. These are all larger than previously seen and in general, more avid.  Numerous small AP window and lesser mediastinal lymph nodes are seen with minimal increased avidity, appearing similar in size and avidity to the previous study. No other significant mediastinal adenopathy. No hilar adenopathy detected. These likely also represent lymphoproliferative disease.  Hyperinflation of COPD, bibasilar dependent atelectasis, some paucity of lung tissue and biapical scarring are noted without other focal pulmonary mass or nodule, or acute pulmonary parenchymal disease. Physiologic uptake in the left heart is seen, as expected. Small hiatal hernia is again identified.   Abdomen pelvis: Physiologic uptake in the liver, gut and urinary tract is again noted. No focal area of asymmetric uptake in the structures is seen. The spleen also demonstrates generalized increased uptake and stable splenomegaly, up to 14.2 cm. On image 198, a preaortic lymph node is detected of increased size since the previous study, now measuring  up to 1.2 cm short axis.  Several large lymph nodes on image 203 of the mesentery are noted. The largest of these, on image 201 and ureters up to 1.8 cm short  axis. Despite the fact that measurable avidity is not detected in these are the surrounding lymph nodes, these are doubtless involved in the lymphoproliferative disease described, and appear larger. Multiple smaller lymph nodes scattered throughout the mesentery are also seen.  Grossly enlarged right greater than left common iliac chain lymph nodes are again seen, throughout the pelvis and into the inguinal areas bilaterally. The most avid on the right is seen on image #260 to, demonstrates a short axis of 2.5 cm, and an SUV max of 4.9. The largest on the left is identified image #272, demonstrates a short axis of 2.0 cm, and an SUV max of 4.7. These are similar to the previously noted values.  Right greater than left inguinal adenopathy is seen, with the largest lymph node seen on the right image 276, demonstrating a short axis of 1.5 cm and an SUV max of 3.9. This is larger in size and avidity than previously seen. There are bilateral enlarged lymph nodes associated with the external iliac vessels on image 277. These measure 2.0 cm on the right in short axis, with an SUV max of 3.9.  On the left, image 278, the external iliac lymph node is larger at 1.8 cm short axis, but with an SUV max of only 3.8. Lesser avid and smaller lymph nodes on both sides are also detected.   Musculoskeletal: Scattered, insignificant uptake is detected in the muscles especially in the bilateral joints, without focal finding to suggest lymphoproliferative disease.   IMPRESSIONS:  Significant increase in numbers, short axis size, and to a large extent, avidity -in the lymph nodes of the left greater than right neck, supraclavicular and infraclavicular areas, bilateral axillary lymph nodes and common iliac chain, external iliac chain and inguinal lymph nodes, consistent with worsening lymphoproliferative disease. The mediastinal lymph nodes seen are still present and unchanged in size or [significantly] in avidity.    [place]-  Bx- SLL/CLL.July 2022-Discussed with Dr. Asquith-oncologist, [place]-reviewed the pathology of the lymph node biopsy in 2021-positive for SLL; negative for cyclin D1/1114 translocation.    # July 20th, 2022- GAZYVA #1 Stann Mainland plan ading VENATOCLAX after 2 cycles/de-bulking] s/p GAYZYVA [x6]- Feb 2023 - cycle #1  Venatoclax- x1 month- HELD sec to repeated hospitalization  # DISCONTINUED Erlene Senters [sec to intolearnce]  # MAY 1st, 2023- START ACALABRUTINIB 100 mg BID  # December 2022-pericarditis/effusion needing pericardiocentesis; FEB 2023-acute respiratory failure-hospitalization-? Etiology [s/p ID; Pul; Dr.A; cards- [facility]]  # Lung cancer screening- on [facility]  # SURVIVORSHIP:   # GENETICS:   DIAGNOSIS:   STAGE:         ;  GOALS:  CURRENT/MOST RECENT THERAPY :       CLL (chronic lymphocytic leukemia) (HCC)  10/29/2019 Initial Diagnosis   CLL (chronic lymphocytic leukemia) (HCC)   06/15/2021 -  Chemotherapy   Patient is on Treatment Plan : LYMPHOMA CLL/SLL Venetoclax + Obinutuzumab q28d          INTERVAL HISTORY: Ambulating independently.  Alone.   A pleasant 71 year-old male patient with CLL/SLL [s/p cycle #6-+ venatoclax [100 mg/day-cycle #1-discontinued because of intolerance is here for follow-up.  In the interim patient had his port explanted.  He discontinued Eliquis as recommended.  No new hospitalizations.  No shortness of breath or cough.  Patient denies any worsening leg  swelling.  Denies any new lumps or bumps.  Appetite is good.  No weight loss.     Review of Systems  Constitutional:  Positive for malaise/fatigue. Negative for chills, diaphoresis, fever and weight loss.  HENT:  Negative for nosebleeds and sore throat.   Eyes:  Negative for double vision.  Respiratory:  Negative for cough, hemoptysis, sputum production, shortness of breath and wheezing.   Cardiovascular:  Negative for chest pain, palpitations, orthopnea and leg swelling.  Gastrointestinal:   Negative for abdominal pain, blood in stool, constipation, diarrhea, heartburn, melena, nausea and vomiting.  Genitourinary:  Negative for dysuria, frequency and urgency.  Musculoskeletal:  Negative for back pain and joint pain.  Skin: Negative.  Negative for itching and rash.  Neurological:  Negative for dizziness, tingling, focal weakness, weakness and headaches.  Endo/Heme/Allergies:  Does not bruise/bleed easily.  Psychiatric/Behavioral:  Negative for depression. The patient is not nervous/anxious and does not have insomnia.     PAST MEDICAL HISTORY :  Past Medical History:  Diagnosis Date   Atherosclerosis of coronary artery 09/28/2016   Chest CT Nov 2017   Atrial tachycardia (HCC)    CLL (chronic lymphocytic leukemia) (HCC)    Elevated C-reactive protein (CRP)    Elevated PSA    Fractured sternum 1999   Herpes    History of rib fracture 1999   multiple   Hyperlipidemia    Psoriasis    Pulmonary embolism (HCC)    Rheumatoid factor positive 02/2014   Rosacea    Thoracic aortic atherosclerosis (HCC) 09/28/2016   Chest CT Nov 2017   Tobacco abuse    Vitamin D deficiency disease     PAST SURGICAL HISTORY :   Past Surgical History:  Procedure Laterality Date   APPENDECTOMY  1974   IR REMOVAL TUN ACCESS W/ PORT W/O FL MOD SED  03/20/2022   PERICARDIOCENTESIS N/A 10/03/2021   Procedure: PERICARDIOCENTESIS;  Surgeon: Corky Crafts, MD;  [facility] INVASIVE CV LAB;  Service: Cardiovascular;  Laterality: N/A;   PORTA CATH INSERTION N/A 06/06/2021   Procedure: PORTA CATH INSERTION;  Surgeon: Annice Needy, MD;  Location: [facility] INVASIVE CV LAB;  Service: Cardiovascular;  Laterality: N/A;   SHOULDER ARTHROSCOPY  10/23/2016   Procedure: ARTHROSCOPY SHOULDER WITH DISTAL CLAVICLE EXCISION, PARTIAL ACROMIONECTOMY, AND DEBRIDEMENT;  Surgeon: Deeann Saint, MD;  Location: [facility] ORS;  Service: Orthopedics;;   TIBIA FRACTURE SURGERY Left 2004   titanium rod, ORIF    FAMILY HISTORY  :   Family History  Problem Relation Age of Onset   Heart disease Father    Osteoarthritis Father    Alzheimer's disease Father    Cancer Neg Hx    Diabetes Neg Hx    Hypertension Neg Hx    Stroke Neg Hx    COPD Neg Hx     SOCIAL HISTORY:   Social History   Tobacco Use   Smoking status: Every Day    Packs/day: 0.50    Years: 45.00    Pack years: 22.50    Types: Cigarettes   Smokeless tobacco: Never   Tobacco comments:    11/6- currently smoking 5 cigarettes a day   Vaping Use   Vaping Use: Never used  Substance Use Topics   Alcohol use: Yes    Alcohol/week: 3.0 standard drinks    Types: 3 Glasses of wine per week    Comment: a couple glasses of wine a week   Drug use: No    ALLERGIES:  is allergic to gluten meal.  MEDICATIONS:  Current Outpatient Medications  Medication Sig Dispense Refill   Acalabrutinib Maleate 100 MG TABS Take 1 tablet (100 mg) by mouth 2 (two) times daily. 60 tablet 6   acyclovir (ZOVIRAX) 400 MG tablet Take 1 tablet (400 mg total) by mouth 2 (two) times daily. 180 tablet 1   cholecalciferol (VITAMIN D3) 25 MCG (1000 UNIT) tablet Take 1,000 Units by mouth daily.     clobetasol cream (TEMOVATE) 0.05 % Apply to affected areas rash on legs once to twice daily until improved. Avoid face, groin, axilla. 60 g 2   hydrocortisone valerate cream (WESTCORT) 0.2 % APPLY TO AFFECTED AREA TWICE A DAY 45 g 0   oxyCODONE (OXY IR/ROXICODONE) 5 MG immediate release tablet Take 1 tablet (5 mg total) by mouth every 6 (six) hours as needed for moderate pain or severe pain. 20 tablet 0   apixaban (ELIQUIS) 5 MG TABS tablet Take 1 tablet (5 mg total) by mouth 2 (two) times daily. (Patient not taking: Reported on 03/27/2022) 60 tablet 5   No current facility-administered medications for this visit.    PHYSICAL EXAMINATION: ECOG PERFORMANCE STATUS: 0 - Asymptomatic  BP 117/82   Pulse 92   Temp 98.7 F (37.1 C)   Resp 20   Wt 165 lb 6.4 oz (75 kg)   SpO2 100%    BMI 26.70 kg/m   Filed Weights   03/27/22 1431  Weight: 165 lb 6.4 oz (75 kg)    No significant lymphadenopathy lymphadenopathy in the neck/underarm.  Physical Exam HENT:     Head: Normocephalic and atraumatic.     Mouth/Throat:     Pharynx: No oropharyngeal exudate.  Eyes:     Pupils: Pupils are equal, round, and reactive to light.  Cardiovascular:     Rate and Rhythm: Normal rate and regular rhythm.  Pulmonary:     Effort: No respiratory distress.     Breath sounds: No wheezing.  Abdominal:     General: Bowel sounds are normal. There is no distension.     Palpations: Abdomen is soft. There is no mass.     Tenderness: There is no abdominal tenderness. There is no guarding or rebound.  Musculoskeletal:        General: No tenderness. Normal range of motion.     Cervical back: Normal range of motion and neck supple.  Skin:    General: Skin is warm.  Neurological:     Mental Status: He is alert and oriented to person, place, and time.  Psychiatric:        Mood and Affect: Affect normal.    LABORATORY DATA:  I have reviewed the data as listed    Component Value Date/Time   NA 136 03/27/2022 1419   NA 139 10/27/2021 0857   K 3.8 03/27/2022 1419   CL 102 03/27/2022 1419   CO2 28 03/27/2022 1419   GLUCOSE 81 03/27/2022 1419   BUN 9 03/27/2022 1419   BUN 16 10/27/2021 0857   CREATININE 0.86 03/27/2022 1419   CREATININE 0.84 10/06/2019 0000   CALCIUM 9.3 03/27/2022 1419   PROT 6.6 03/27/2022 1419   ALBUMIN 3.9 03/27/2022 1419   AST 18 03/27/2022 1419   ALT 18 03/27/2022 1419   ALKPHOS 75 03/27/2022 1419   BILITOT 0.7 03/27/2022 1419   GFRNONAA >60 03/27/2022 1419   GFRNONAA 91 10/06/2019 0000   GFRAA >60 12/26/2019 0842   GFRAA 106 10/06/2019 0000  No results found for: SPEP, UPEP  Lab Results  Component Value Date   WBC 7.4 03/27/2022   NEUTROABS 4.7 03/27/2022   HGB 15.6 03/27/2022   HCT 45.3 03/27/2022   MCV 87.5 03/27/2022   PLT 289 03/27/2022       Chemistry      Component Value Date/Time   NA 136 03/27/2022 1419   NA 139 10/27/2021 0857   K 3.8 03/27/2022 1419   CL 102 03/27/2022 1419   CO2 28 03/27/2022 1419   BUN 9 03/27/2022 1419   BUN 16 10/27/2021 0857   CREATININE 0.86 03/27/2022 1419   CREATININE 0.84 10/06/2019 0000      Component Value Date/Time   CALCIUM 9.3 03/27/2022 1419   ALKPHOS 75 03/27/2022 1419   AST 18 03/27/2022 1419   ALT 18 03/27/2022 1419   BILITOT 0.7 03/27/2022 1419       RADIOGRAPHIC STUDIES: I have personally reviewed the radiological images as listed and agreed with the findings in the report. No results found.   ASSESSMENT & PLAN:   .CLL (chronic lymphocytic leukemia) (HCC) # SLL/CLL- Patient currently s/p cycle #6 of Gazyva [#6 in Jan 2023]; Currently on venatoclax hold.  Recommend discontinuation of venatolax because of multiple hospitalizations [multiple hospitalizations pericarditis pericardial effusions/respiratory issues].  #Recommend proceeding with with acalabrutinib 100 mg twice a day.  Discussed with Revonda Standard; educated patient.  Will reevaluate in approximately 1 month.  # Repeated acute respiratory failure-2022 pericardial effusion/pericarditis- Most likely related to induced Gazyva/venetoclax.  Quantitative immunoglobulins above 500.  # PE acute [NOV 2022] provoked--currently s/p  Eliquis x 6 months [end of  April 2023].    # Continue  shingles/TLS prophylaxis: Acyclovir- [stop in July 2023; 6 months from [facility] January, 2023]- STABLE.   # Mediport placement: explanted   # DISPOSITION: # follow up in 1 months- MD; labs- cbc/cmp:[facility]-Dr.B              Orders Placed This Encounter  Procedures   CBC with Differential/Platelet    Standing Status:   Future    Standing Expiration Date:   03/28/2023   Comprehensive metabolic panel    Standing Status:   Future    Standing Expiration Date:   03/28/2023   Lactate dehydrogenase    Standing Status:   Future     Standing Expiration Date:   03/28/2023            Earna Coder, MD 03/27/2022 3:23 PM

## 2022-04-03 ENCOUNTER — Ambulatory Visit (INDEPENDENT_AMBULATORY_CARE_PROVIDER_SITE_OTHER): Payer: Medicare PPO | Admitting: Dermatology

## 2022-04-03 DIAGNOSIS — L400 Psoriasis vulgaris: Secondary | ICD-10-CM | POA: Diagnosis not present

## 2022-04-03 NOTE — Progress Notes (Signed)
Patient here today for xtrac treatment for psoriasis vulgaris.  ? ?Total Surface Area: 44cm2 ?Total Energy: 35.33J ?Service Code: [phone] ? ?Johnsie Kindred, RMA ? ?Documentation: I have reviewed the above documentation for accuracy and completeness, and I agree with the above. ? ?Brendolyn Patty MD  ? ?

## 2022-04-11 ENCOUNTER — Ambulatory Visit (INDEPENDENT_AMBULATORY_CARE_PROVIDER_SITE_OTHER): Payer: Medicare PPO | Admitting: Dermatology

## 2022-04-11 ENCOUNTER — Other Ambulatory Visit (HOSPITAL_COMMUNITY): Payer: Self-pay

## 2022-04-11 DIAGNOSIS — L400 Psoriasis vulgaris: Secondary | ICD-10-CM

## 2022-04-11 NOTE — Progress Notes (Signed)
Patient here today for xtrac treatment of Psoriasis Vulgaris.  ?  ?Total Surface Area: 60cm2 ?Total Energy: 48.18J ?Service Code: [phone] ?  ?Johnsie Kindred, RMA ? ?Documentation: I have reviewed the above documentation for accuracy and completeness, and I agree with the above. ? ?Brendolyn Patty MD  ?  ?

## 2022-04-13 ENCOUNTER — Other Ambulatory Visit (HOSPITAL_COMMUNITY): Payer: Self-pay

## 2022-04-18 ENCOUNTER — Ambulatory Visit (INDEPENDENT_AMBULATORY_CARE_PROVIDER_SITE_OTHER): Payer: Medicare PPO | Admitting: Dermatology

## 2022-04-18 DIAGNOSIS — L400 Psoriasis vulgaris: Secondary | ICD-10-CM

## 2022-04-18 NOTE — Progress Notes (Signed)
Patient here today for xtrac treatment of Psoriasis Vulgaris.    Total Surface Area: 68cm2 Total Energy: 62.76J Service Code: [id number]   Johnsie Kindred, RMA  Documentation: I have reviewed the above documentation for accuracy and completeness, and I agree with the above.  Brendolyn Patty MD

## 2022-04-20 ENCOUNTER — Other Ambulatory Visit (HOSPITAL_COMMUNITY): Payer: Self-pay

## 2022-04-21 ENCOUNTER — Other Ambulatory Visit: Payer: Self-pay | Admitting: Internal Medicine

## 2022-04-25 ENCOUNTER — Ambulatory Visit (INDEPENDENT_AMBULATORY_CARE_PROVIDER_SITE_OTHER): Payer: Medicare PPO | Admitting: Dermatology

## 2022-04-25 DIAGNOSIS — L400 Psoriasis vulgaris: Secondary | ICD-10-CM | POA: Diagnosis not present

## 2022-04-25 NOTE — Progress Notes (Signed)
Patient here today for xtrac treatment of Psoriasis Vulgaris.    Total Surface Area: 40cm2 Total Energy: 36.92J Service Code: [id number]   Andres Mullins, RMA  Documentation: I have reviewed the above documentation for accuracy and completeness, and I agree with the above.  Brendolyn Patty MD
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
## 2022-04-26 ENCOUNTER — Encounter: Payer: Self-pay | Admitting: Physician Assistant

## 2022-04-26 ENCOUNTER — Ambulatory Visit (INDEPENDENT_AMBULATORY_CARE_PROVIDER_SITE_OTHER): Payer: Medicare PPO | Admitting: Physician Assistant

## 2022-04-26 VITALS — BP 100/70 | HR 84 | Ht 66.0 in | Wt 169.1 lb

## 2022-04-26 DIAGNOSIS — I2510 Atherosclerotic heart disease of native coronary artery without angina pectoris: Secondary | ICD-10-CM | POA: Diagnosis not present

## 2022-04-26 DIAGNOSIS — I3139 Other pericardial effusion (noninflammatory): Secondary | ICD-10-CM | POA: Diagnosis not present

## 2022-04-26 DIAGNOSIS — E785 Hyperlipidemia, unspecified: Secondary | ICD-10-CM

## 2022-04-26 DIAGNOSIS — I2584 Coronary atherosclerosis due to calcified coronary lesion: Secondary | ICD-10-CM

## 2022-04-26 DIAGNOSIS — C9110 Chronic lymphocytic leukemia of B-cell type not having achieved remission: Secondary | ICD-10-CM | POA: Diagnosis not present

## 2022-04-26 DIAGNOSIS — J90 Pleural effusion, not elsewhere classified: Secondary | ICD-10-CM | POA: Diagnosis not present

## 2022-04-26 DIAGNOSIS — I4510 Unspecified right bundle-branch block: Secondary | ICD-10-CM | POA: Diagnosis not present

## 2022-04-26 DIAGNOSIS — Z86711 Personal history of pulmonary embolism: Secondary | ICD-10-CM | POA: Diagnosis not present

## 2022-04-26 NOTE — Patient Instructions (Signed)
Medication Instructions:  No changes at this time.  *If you need a refill on your cardiac medications before your next appointment, please call your pharmacy*   Lab Work: None  If you have labs (blood work) drawn today and your tests are completely normal, you will receive your results only by: [place] (if you have MyChart) OR A paper copy in the mail If you have any lab test that is abnormal or we need to change your treatment, we will call you to review the results.   Testing/Procedures: None   Follow-Up: At [facility], you and your health needs are our priority.  As part of our continuing mission to provide you with exceptional heart care, we have created designated Provider Care Teams.  These Care Teams include your primary Cardiologist (physician) and Advanced Practice Providers (APPs -  Physician Assistants and Nurse Practitioners) who all work together to provide you with the care you need, when you need it.   Your next appointment:   1 year(s)  The format for your next appointment:   In Person  Provider:   You may see [name], MD or one of the following Advanced Practice Providers on your designated Care Team:   Murray Hodgkins, NP Christell Faith, PA-C Cadence Kathlen Mody, PA-C{      Important Information About Sugar

## 2022-04-27 ENCOUNTER — Inpatient Hospital Stay: Payer: Medicare PPO | Attending: Internal Medicine

## 2022-04-27 ENCOUNTER — Encounter: Payer: Self-pay | Admitting: Internal Medicine

## 2022-04-27 ENCOUNTER — Inpatient Hospital Stay (HOSPITAL_BASED_OUTPATIENT_CLINIC_OR_DEPARTMENT_OTHER): Payer: Medicare PPO | Admitting: Internal Medicine

## 2022-04-27 DIAGNOSIS — E785 Hyperlipidemia, unspecified: Secondary | ICD-10-CM | POA: Insufficient documentation

## 2022-04-27 DIAGNOSIS — R519 Headache, unspecified: Secondary | ICD-10-CM | POA: Insufficient documentation

## 2022-04-27 DIAGNOSIS — R0789 Other chest pain: Secondary | ICD-10-CM | POA: Insufficient documentation

## 2022-04-27 DIAGNOSIS — Z86718 Personal history of other venous thrombosis and embolism: Secondary | ICD-10-CM | POA: Insufficient documentation

## 2022-04-27 DIAGNOSIS — F17210 Nicotine dependence, cigarettes, uncomplicated: Secondary | ICD-10-CM | POA: Insufficient documentation

## 2022-04-27 DIAGNOSIS — J449 Chronic obstructive pulmonary disease, unspecified: Secondary | ICD-10-CM | POA: Insufficient documentation

## 2022-04-27 DIAGNOSIS — C9110 Chronic lymphocytic leukemia of B-cell type not having achieved remission: Secondary | ICD-10-CM | POA: Diagnosis not present

## 2022-04-27 DIAGNOSIS — Z8249 Family history of ischemic heart disease and other diseases of the circulatory system: Secondary | ICD-10-CM | POA: Diagnosis not present

## 2022-04-27 DIAGNOSIS — Z8261 Family history of arthritis: Secondary | ICD-10-CM | POA: Insufficient documentation

## 2022-04-27 DIAGNOSIS — I2510 Atherosclerotic heart disease of native coronary artery without angina pectoris: Secondary | ICD-10-CM | POA: Insufficient documentation

## 2022-04-27 DIAGNOSIS — Z9049 Acquired absence of other specified parts of digestive tract: Secondary | ICD-10-CM | POA: Insufficient documentation

## 2022-04-27 DIAGNOSIS — Z86711 Personal history of pulmonary embolism: Secondary | ICD-10-CM | POA: Diagnosis not present

## 2022-04-27 DIAGNOSIS — Z79899 Other long term (current) drug therapy: Secondary | ICD-10-CM | POA: Diagnosis not present

## 2022-04-27 DIAGNOSIS — R5383 Other fatigue: Secondary | ICD-10-CM | POA: Insufficient documentation

## 2022-04-27 DIAGNOSIS — M2550 Pain in unspecified joint: Secondary | ICD-10-CM | POA: Insufficient documentation

## 2022-04-27 DIAGNOSIS — K449 Diaphragmatic hernia without obstruction or gangrene: Secondary | ICD-10-CM | POA: Insufficient documentation

## 2022-04-27 DIAGNOSIS — Z7952 Long term (current) use of systemic steroids: Secondary | ICD-10-CM | POA: Insufficient documentation

## 2022-04-27 DIAGNOSIS — Z818 Family history of other mental and behavioral disorders: Secondary | ICD-10-CM | POA: Insufficient documentation

## 2022-04-27 LAB — COMPREHENSIVE METABOLIC PANEL
ALT: 20 U/L (ref 0–44)
AST: 18 U/L (ref 15–41)
Albumin: 3.9 g/dL (ref 3.5–5.0)
Alkaline Phosphatase: 65 U/L (ref 38–126)
Anion gap: 6 (ref 5–15)
BUN: 11 mg/dL (ref 8–23)
CO2: 26 mmol/L (ref 22–32)
Calcium: 9 mg/dL (ref 8.9–10.3)
Chloride: 105 mmol/L (ref 98–111)
Creatinine, Ser: 0.76 mg/dL (ref 0.61–1.24)
GFR, Estimated: >60 mL/min (ref >60)
Glucose, Bld: 108 mg/dL — ABNORMAL HIGH (ref 70–99)
Potassium: 4.3 mmol/L (ref 3.5–5.1)
Sodium: 137 mmol/L (ref 135–145)
Total Bilirubin: 0.6 mg/dL (ref 0.3–1.2)
Total Protein: 6.7 g/dL (ref 6.5–8.1)

## 2022-04-27 LAB — CBC WITH DIFFERENTIAL/PLATELET
Abs Immature Granulocytes: 0.03 10*3/uL (ref 0.00–0.07)
Basophils Absolute: 0.1 10*3/uL (ref 0.0–0.1)
Basophils Relative: 1 %
Eosinophils Absolute: 0.1 10*3/uL (ref 0.0–0.5)
Eosinophils Relative: 1 %
HCT: 46.2 % (ref 39.0–52.0)
Hemoglobin: 15.8 g/dL (ref 13.0–17.0)
Immature Granulocytes: 0 %
Lymphocytes Relative: 14 %
Lymphs Abs: 1.4 10*3/uL (ref 0.7–4.0)
MCH: 30.3 pg (ref 26.0–34.0)
MCHC: 34.2 g/dL (ref 30.0–36.0)
MCV: 88.5 fL (ref 80.0–100.0)
Monocytes Absolute: 0.8 10*3/uL (ref 0.1–1.0)
Monocytes Relative: 9 %
Neutro Abs: 7.2 10*3/uL (ref 1.7–7.7)
Neutrophils Relative %: 75 %
Platelets: 233 10*3/uL (ref 150–400)
RBC: 5.22 MIL/uL (ref 4.22–5.81)
RDW: 14.6 % (ref 11.5–15.5)
WBC: 9.6 10*3/uL (ref 4.0–10.5)
nRBC: 0 % (ref 0.0–0.2)

## 2022-04-27 LAB — LACTATE DEHYDROGENASE: LDH: 111 U/L (ref 98–192)

## 2022-04-27 NOTE — Progress Notes (Signed)
Patient reports slight fatigue and muscle weakness since starting Acalabrutinib.

## 2022-04-27 NOTE — Assessment & Plan Note (Addendum)
#   SLL/CLL- Patient currently s/p cycle #6 of Gazyva [#6 in Jan 2023]; Currently on venatoclax hold.  Recommend discontinuation of venatolax because of multiple hospitalizations [multiple hospitalizations pericarditis pericardial effusions/respiratory issues].  # Currently acalabrutinib 100 mg twice a day [since start of May 2023]- tolearting well except mild fague/headachs/es joint aches  # Headaches-sec to Acala- G-1 improved with caffeine.  Monitor for now.  # Repeated acute respiratory failure-2022 pericardial effusion/pericarditis- [ likely related to induced Gazyva/venetoclax].  Quantitative immunoglobulins above 500- monitor for now. .  # Continue  shingles/TLS prophylaxis: Acyclovir- [stop in July 2023; 6 months from [facility] January, 2023]- STABLE.   # Mediport placement: explanted   # DISPOSITION: # follow up in 2nd week of sep- MD; labs- cbc/cmp:[facility]-Dr.B

## 2022-04-27 NOTE — Progress Notes (Signed)
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   AUG 2021-Head and neck: The brain demonstrates symmetric appearing diffuse physiologic uptake without focal finding to suggest abnormality. In the left posterior and medial maxillary cavity, there is an area of mucoid deposition seen within SUV max of 28.8. This was seen in the previous study of November. The specific bony erosion, clear-cut soft tissue lesion is not detected. This probably represents a chronic sinus disease focus. The bilateral optic nerves and extraocular muscles to the largest nodes are seen on axial image 98. 1 now demonstrates a short axis measurement of 3.3 cm and the other more medially, a short axis measurement of 2.5 cm. Demonstrate symmetric increased uptake. The left, much greater than right, jugulodigastric and supraclavicular nodes have increased significantly in size and avidity since the previous study. Both of these lymph nodes demonstrate an SUV max of 3.9. No largest lymph node in the right subclavicular space is seen on axial image 104, demonstrating an SUV max of 3.3, and a short axis measurement of 1.4 cm.   On axial image 102, a right paratracheal lymph node is again identified with increased avidity,  located immediately posterior to the right thyroid pole. This area was noted previously, but now increased avidity is seen, with an SUV max of 8.6 in this area. Multiple smaller, and less avid lymph nodes scattered throughout the left greater than right neck are seen, as well as supraclavicular and infraclavicular areas.   Thorax: Significant axillary lymph nodes have increased significantly in size. These are also left greater than right, with the largest on the left seen on image #114 with a short axis measurement of 2.2 cm and an SUV max of 4.3. The largest on the right as seen on image 111, demonstrating a short axis of 1.4 cm and an SUV max of 4.0. Too numerous to count smaller and similarly avid lymph nodes in both axillae are also noted. These are all larger than previously seen and in general, more avid.  Numerous small AP window and lesser mediastinal lymph nodes are seen with minimal increased avidity, appearing similar in size and avidity to the previous study. No other significant mediastinal adenopathy. No hilar adenopathy detected. These likely also represent lymphoproliferative disease.  Hyperinflation of COPD, bibasilar dependent atelectasis, some paucity of lung tissue and biapical scarring are noted without other focal pulmonary mass or nodule, or acute pulmonary parenchymal disease. Physiologic uptake in the left heart is seen, as expected. Small hiatal hernia is again identified.   Abdomen pelvis: Physiologic uptake in the liver, gut and urinary tract is again noted. No focal area of asymmetric uptake in the structures is seen. The spleen also demonstrates generalized increased uptake and stable splenomegaly, up to 14.2 cm. On image 198, a preaortic lymph node is detected of increased size since the previous study, now measuring  up to 1.2 cm short axis.  Several large lymph nodes on image 203 of the mesentery are noted. The largest of these, on image 201 and ureters up to 1.8 cm short  axis. Despite the fact that measurable avidity is not detected in these are the surrounding lymph nodes, these are doubtless involved in the lymphoproliferative disease described, and appear larger. Multiple smaller lymph nodes scattered throughout the mesentery are also seen.  Grossly enlarged right greater than left common iliac chain lymph nodes are again seen, throughout the pelvis and into the inguinal areas bilaterally. The most avid on the right is seen on image #260 to, demonstrates a short axis of 2.5 cm, and an SUV max of 4.9. The largest on the left is identified image #272, demonstrates a short axis of 2.0 cm, and an SUV max of 4.7. These are similar to the previously noted values.  Right greater than left inguinal adenopathy is seen, with the largest lymph node seen on the right image 276, demonstrating a short axis of 1.5 cm and an SUV max of 3.9. This is larger in size and avidity than previously seen. There are bilateral enlarged lymph nodes associated with the external iliac vessels on image 277. These measure 2.0 cm on the right in short axis, with an SUV max of 3.9.  On the left, image 278, the external iliac lymph node is larger at 1.8 cm short axis, but with an SUV max of only 3.8. Lesser avid and smaller lymph nodes on both sides are also detected.   Musculoskeletal: Scattered, insignificant uptake is detected in the muscles especially in the bilateral joints, without focal finding to suggest lymphoproliferative disease.   IMPRESSIONS:  Significant increase in numbers, short axis size, and to a large extent, avidity -in the lymph nodes of the left greater than right neck, supraclavicular and infraclavicular areas, bilateral axillary lymph nodes and common iliac chain, external iliac chain and inguinal lymph nodes, consistent with worsening lymphoproliferative disease. The mediastinal lymph nodes seen are still present and unchanged in size or [significantly] in avidity.    [place]-  Bx- SLL/CLL.July 2022-Discussed with Dr. Asquith-oncologist, [place]-reviewed the pathology of the lymph node biopsy in 2021-positive for SLL; negative for cyclin D1/1114 translocation.    # July 20th, 2022- GAZYVA #1 Aletha Halim plan ading VENATOCLAX after 2 cycles/de-bulking] s/p GAYZYVA [x6]- Feb 2023 - cycle #1  Venatoclax- x1 month- HELD sec to repeated hospitalization  # DISCONTINUED Marijo Sanes [sec to [facility]]  # PE acute [NOV 8882] provoked--currently s/p  Eliquis x 6 months [end of  April 2023].    # MAY 1st, 2023- START ACALABRUTINIB 100 mg BID  # December 2022-pericarditis/effusion needing pericardiocentesis; FEB 2023-acute respiratory failure-hospitalization-? Etiology [s/p ID; Pul; Dr.A; cards- [facility]]  # Lung cancer screening- on [facility]  # SURVIVORSHIP:   # GENETICS:   DIAGNOSIS:   STAGE:         ;  GOALS:  CURRENT/MOST RECENT THERAPY :       CLL (chronic lymphocytic leukemia) ([place])  10/29/2019 Initial Diagnosis   CLL (chronic lymphocytic leukemia) ([place])   06/15/2021 -  Chemotherapy   Patient is on Treatment Plan : LYMPHOMA CLL/SLL Venetoclax + Obinutuzumab q28d         INTERVAL HISTORY: Ambulating independently.  Alone.   A pleasant 71 year-old male patient with CLL/SLL [s/p cycle #6-+ venatoclax [100 mg/day-cycle #1-discontinued because of intolerance; currently on acalabrutinib [since May 1st] is here for follow-up.  Patient complains of mild fatigue.  Complains of mild joint pains.  Some mild intermittent headaches improved with caffeine.   No new hospitalizations.  No shortness of breath or cough. Patient denies any worsening leg swelling.  Denies any new lumps or bumps.  Appetite is good.  No weight loss.  He is planned to go to [place] in the end of the month.   Review of Systems  Constitutional:  Positive for malaise/fatigue. Negative for chills, diaphoresis, fever and weight loss.  HENT:  Negative for nosebleeds and sore throat.   Eyes:  Negative for  double vision.  Respiratory:  Negative for cough, hemoptysis, sputum production, shortness of breath and wheezing.   Cardiovascular:  Negative for chest pain, palpitations, orthopnea and leg swelling.  Gastrointestinal:  Negative for abdominal pain, blood in stool, constipation, diarrhea, heartburn, melena, nausea and vomiting.  Genitourinary:  Negative for dysuria, frequency and urgency.  Musculoskeletal:  Negative for back pain and joint pain.  Skin: Negative.  Negative for itching and rash.  Neurological:  Negative for dizziness, tingling, focal weakness, weakness and headaches.  Endo/Heme/Allergies:  Does not bruise/bleed easily.  Psychiatric/Behavioral:  Negative for depression. The patient is not nervous/anxious and does not have insomnia.     PAST MEDICAL HISTORY :  Past Medical History:  Diagnosis Date   Atherosclerosis of coronary artery 09/28/2016   Chest CT Nov 2017   Atrial tachycardia ([facility])    CLL (chronic lymphocytic leukemia) ([facility])    Elevated C-reactive protein (CRP)    Elevated PSA    Fractured sternum 1999   Herpes    History of rib fracture 1999   multiple   Hyperlipidemia    Psoriasis    Pulmonary embolism ([place])    Rheumatoid factor positive 02/2014   Rosacea    Thoracic aortic atherosclerosis ([place]) 09/28/2016   Chest CT Nov 2017   Tobacco abuse    Vitamin D deficiency disease     PAST SURGICAL HISTORY :   Past Surgical History:  Procedure Laterality Date   APPENDECTOMY  1974   IR REMOVAL TUN ACCESS W/ PORT W/O FL MOD SED  03/20/2022   PERICARDIOCENTESIS N/A 10/03/2021   Procedure: PERICARDIOCENTESIS;  Surgeon: Jettie Booze, MD;  Location: [facility];  Service: Cardiovascular;  Laterality: N/A;   PORTA CATH INSERTION N/A 06/06/2021   Procedure: PORTA CATH INSERTION;  Surgeon: Algernon Huxley, MD;  Location: [facility];  Service: Cardiovascular;  Laterality: N/A;   SHOULDER ARTHROSCOPY  10/23/2016   Procedure: ARTHROSCOPY SHOULDER  WITH DISTAL CLAVICLE EXCISION, PARTIAL ACROMIONECTOMY, AND DEBRIDEMENT;  Surgeon: Earnestine Leys, MD;  Location: [facility];  Service: Orthopedics;;   TIBIA FRACTURE SURGERY Left 2004   titanium rod, ORIF    FAMILY HISTORY :   Family History  Problem Relation Age of Onset   Heart disease Father    Osteoarthritis Father    Alzheimer's disease Father    Cancer Neg Hx    Diabetes Neg Hx    Hypertension Neg Hx    Stroke Neg Hx    COPD Neg Hx     SOCIAL HISTORY:   Social History   Tobacco Use   Smoking status: Every Day    Packs/day: 0.50    Years: 45.00    Pack years: 22.50    Types: Cigarettes   Smokeless tobacco: Never   Tobacco comments:    11/6- currently smoking 5 cigarettes a day   Vaping Use   Vaping Use: Never used  Substance Use Topics  Alcohol use: Yes    Alcohol/week: 3.0 standard drinks    Types: 3 Glasses of wine per week    Comment: a couple glasses of wine a week   Drug use: No    ALLERGIES:  is allergic to gluten meal.  MEDICATIONS:  Current Outpatient Medications  Medication Sig Dispense Refill   Acalabrutinib Maleate 100 MG TABS Take 1 tablet (100 mg) by mouth 2 (two) times daily. 60 tablet 6   acyclovir (ZOVIRAX) 400 MG tablet TAKE 1 TABLET BY MOUTH TWICE A DAY 180 tablet 1   cholecalciferol (VITAMIN D3) 25 MCG (1000 UNIT) tablet Take 1,000 Units by mouth daily.     clobetasol cream (TEMOVATE) 0.05 % Apply to affected areas rash on legs once to twice daily until improved. Avoid face, groin, axilla. 60 g 2   hydrocortisone valerate cream (WESTCORT) 0.2 % APPLY TO AFFECTED AREA TWICE A DAY 45 g 0   oxyCODONE (OXY IR/ROXICODONE) 5 MG immediate release tablet Take 1 tablet (5 mg total) by mouth every 6 (six) hours as needed for moderate pain or severe pain. 20 tablet 0   No current facility-administered medications for this visit.    PHYSICAL EXAMINATION: ECOG PERFORMANCE STATUS: 0 - Asymptomatic  BP 103/74   Pulse 82   Temp (!) 97.3 F (36.3 C)    Resp 18   Wt 168 lb (76.2 kg)   SpO2 97%   BMI 27.12 kg/m   Filed Weights   04/27/22 1100  Weight: 168 lb (76.2 kg)    No significant lymphadenopathy lymphadenopathy in the neck/underarm.  Physical Exam HENT:     Head: Normocephalic and atraumatic.     Mouth/Throat:     Pharynx: No oropharyngeal exudate.  Eyes:     Pupils: Pupils are equal, round, and reactive to light.  Cardiovascular:     Rate and Rhythm: Normal rate and regular rhythm.  Pulmonary:     Effort: No respiratory distress.     Breath sounds: No wheezing.  Abdominal:     General: Bowel sounds are normal. There is no distension.     Palpations: Abdomen is soft. There is no mass.     Tenderness: There is no abdominal tenderness. There is no guarding or rebound.  Musculoskeletal:        General: No tenderness. Normal range of motion.     Cervical back: Normal range of motion and neck supple.  Skin:    General: Skin is warm.  Neurological:     Mental Status: He is alert and oriented to person, place, and time.  Psychiatric:        Mood and Affect: Affect normal.    LABORATORY DATA:  I have reviewed the data as listed    Component Value Date/Time   NA 137 04/27/2022 1048   NA 139 10/27/2021 0857   K 4.3 04/27/2022 1048   CL 105 04/27/2022 1048   CO2 26 04/27/2022 1048   GLUCOSE 108 (H) 04/27/2022 1048   BUN 11 04/27/2022 1048   BUN 16 10/27/2021 0857   CREATININE 0.76 04/27/2022 1048   CREATININE 0.84 10/06/2019 0000   CALCIUM 9.0 04/27/2022 1048   PROT 6.7 04/27/2022 1048   ALBUMIN 3.9 04/27/2022 1048   AST 18 04/27/2022 1048   ALT 20 04/27/2022 1048   ALKPHOS 65 04/27/2022 1048   BILITOT 0.6 04/27/2022 1048   GFRNONAA >60 04/27/2022 1048   GFRNONAA 91 10/06/2019 0000   GFRAA >60 12/26/2019 0842   GFRAA  106 10/06/2019 0000    No results found for: SPEP, UPEP  Lab Results  Component Value Date   WBC 9.6 04/27/2022   NEUTROABS 7.2 04/27/2022   HGB 15.8 04/27/2022   HCT 46.2 04/27/2022    MCV 88.5 04/27/2022   PLT 233 04/27/2022      Chemistry      Component Value Date/Time   NA 137 04/27/2022 1048   NA 139 10/27/2021 0857   K 4.3 04/27/2022 1048   CL 105 04/27/2022 1048   CO2 26 04/27/2022 1048   BUN 11 04/27/2022 1048   BUN 16 10/27/2021 0857   CREATININE 0.76 04/27/2022 1048   CREATININE 0.84 10/06/2019 0000      Component Value Date/Time   CALCIUM 9.0 04/27/2022 1048   ALKPHOS 65 04/27/2022 1048   AST 18 04/27/2022 1048   ALT 20 04/27/2022 1048   BILITOT 0.6 04/27/2022 1048       RADIOGRAPHIC STUDIES: I have personally reviewed the radiological images as listed and agreed with the findings in the report. No results found.   ASSESSMENT & PLAN:   .CLL (chronic lymphocytic leukemia) ([place]) # SLL/CLL- Patient currently s/p cycle #6 of Gazyva [#6 in Jan 2023]; Currently on venatoclax hold.  Recommend discontinuation of venatolax because of multiple hospitalizations [multiple hospitalizations pericarditis pericardial effusions/respiratory issues].  # Currently acalabrutinib 100 mg twice a day [since start of May 2023]- tolearting well except mild fague/headachs/es joint aches  # Headaches-sec to [facility]- G-1 improved with caffeine.  Monitor for now.  # Repeated acute respiratory failure-2022 pericardial effusion/pericarditis- [ likely related to induced Gazyva/venetoclax].  Quantitative immunoglobulins above 500- monitor for now. .  # Continue  shingles/TLS prophylaxis: Acyclovir- [stop in July 2023; 6 months from [facility] January, 2023]- STABLE.   # Mediport placement: explanted   # DISPOSITION: # follow up in 2nd week of sep- MD; labs- cbc/cmp:[facility]-Dr.B              Orders Placed This Encounter  Procedures   CBC with Differential/Platelet    Standing Status:   Future    Standing Expiration Date:   04/28/2023   Comprehensive metabolic panel    Standing Status:   Future    Standing Expiration Date:   04/28/2023   Lactate dehydrogenase     Standing Status:   Future    Standing Expiration Date:   04/28/2023            Cammie Sickle, MD 04/27/2022 1:01 PM

## 2022-05-01 ENCOUNTER — Ambulatory Visit (INDEPENDENT_AMBULATORY_CARE_PROVIDER_SITE_OTHER): Payer: Medicare PPO | Admitting: Dermatology

## 2022-05-01 ENCOUNTER — Ambulatory Visit: Payer: Medicare PPO | Admitting: Internal Medicine

## 2022-05-01 ENCOUNTER — Other Ambulatory Visit: Payer: Medicare PPO

## 2022-05-01 DIAGNOSIS — L400 Psoriasis vulgaris: Secondary | ICD-10-CM | POA: Diagnosis not present

## 2022-05-01 NOTE — Progress Notes (Signed)
Patient here today for xtrac treatment of Psoriasis Vulgaris.    Total Surface Area: 56cm2 Total Energy: 59.42J Service Code: 57900  Dicie Beam RMA  Documentation: I have reviewed the above documentation for accuracy and completeness, and I agree with the above.  Brendolyn Patty MD

## 2022-05-03 ENCOUNTER — Encounter: Payer: Self-pay | Admitting: Internal Medicine

## 2022-05-03 ENCOUNTER — Ambulatory Visit (INDEPENDENT_AMBULATORY_CARE_PROVIDER_SITE_OTHER): Payer: Medicare PPO | Admitting: Internal Medicine

## 2022-05-03 VITALS — BP 124/78 | HR 85 | Temp 97.5°F | Resp 16 | Ht 66.0 in | Wt 167.2 lb

## 2022-05-03 DIAGNOSIS — E559 Vitamin D deficiency, unspecified: Secondary | ICD-10-CM | POA: Diagnosis not present

## 2022-05-03 DIAGNOSIS — Z86711 Personal history of pulmonary embolism: Secondary | ICD-10-CM

## 2022-05-03 DIAGNOSIS — C9110 Chronic lymphocytic leukemia of B-cell type not having achieved remission: Secondary | ICD-10-CM | POA: Diagnosis not present

## 2022-05-03 NOTE — Patient Instructions (Addendum)
It was great seeing you today!  Plan discussed at today's visit: -Continue current medications -Plan on getting second Shingles vaccine  -Recommend following up with Derm to check out lesion on eyelid   Follow up in: 6 months   Take care and let us know if you have any questions or concerns prior to your next visit.  Dr. Rosana Berger

## 2022-05-03 NOTE — Progress Notes (Signed)
Established Patient Office Visit  Subjective   Patient ID: Andres Mullins, male    DOB: 07/16/1951  Age: 71 y.o. MRN: [id number]  Chief Complaint  Patient presents with   Follow-up    HPI Andres Mullins is a 71 year old male here here for follow up on chronic medical conditions. Planning on going to [place] this summer until late September to visit family.   CLL: -First diagnosed 12/2019. Following with Dr. Rogue Mullins, Oncology. Last note from 04/27/22 reviewed.  -Currently on Acalabrutinib 100 mg BID -Had been on Venatolax but this was discontinued due to multiple hospitalizations/pericarditis/pleural effusions  -On shingles ppx with Acyclovir, plan to continue until July 2023.  Did have first Shingrix vaccine at his pharmacy, will plan on getting the second dose in the next few months. -Finished 6 rounds of infusions chemo - port removed and doing well overall.  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   Patient Active Problem List   Diagnosis Date Noted   Pleural effusion due to CHF (congestive heart failure) ([place]) 01/17/2022   Fever with chills 70/62/3762   Acute diastolic CHF (congestive heart failure) ([place]) 01/16/2022   History of pericarditis 01/16/2022   Tobacco use disorder, continuous 12/19/2021   Pulmonary embolism ([place]) 10/16/2021   Sepsis due to pneumonia ([place]) 10/03/2021   Pericardial effusion 10/03/2021   Oropharyngeal candidiasis 10/03/2021   Hyponatremia 10/03/2021   Acute hypoxemic respiratory failure ([place]) 10/03/2021   Cardiac tamponade 10/03/2021   CLL (chronic lymphocytic leukemia) ([place]) 10/29/2019   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   Welcome to Medicare preventive visit 11/15/2018   Renal lesion 11/12/2018   Prostate enlargement  11/12/2018   Acquired trigger finger 11/12/2018   Trigger middle finger of right hand 01/02/2018   Positive anti-CCP test 12/12/2017   Rheumatoid factor positive 12/12/2017   Trigger finger of both hands 12/12/2017   Bilateral hand pain 05/18/2017   Preventative health care 05/18/2017   Screen for colon cancer 05/18/2017   Coronary artery calcification 01/18/2017   Supraspinatus tendinitis, left 10/22/2016   Impingement syndrome of shoulder region 10/06/2016   Celiac disease 10/05/2016   Atherosclerosis of coronary artery 09/28/2016   Thoracic aortic atherosclerosis ([place]) 09/28/2016   Personal history of tobacco use, presenting hazards to health 09/26/2016   Lipoma of head 10/19/2015   Tendonitis, Achilles, left 10/14/2015   Herpes    Rosacea    Psoriasis    Hyperlipidemia    Vitamin D deficiency disease    Tobacco abuse    Elevated PSA    Elevated C-reactive protein (CRP)    Past Medical History:  Diagnosis Date   Atherosclerosis of coronary artery 09/28/2016   Chest CT Nov 2017   Atrial tachycardia (HCC)    CLL (chronic lymphocytic leukemia) (HCC)    Elevated C-reactive protein (CRP)    Elevated PSA    Fractured sternum 1999   Herpes    History of rib fracture 1999   multiple   Hyperlipidemia    Psoriasis    Pulmonary embolism ([place])    Rheumatoid factor positive 02/2014   Rosacea    Thoracic aortic atherosclerosis ([place]) 09/28/2016   Chest CT Nov 2017   Tobacco abuse    Vitamin D deficiency disease  Past Surgical History:  Procedure Laterality Date   APPENDECTOMY  1974   IR REMOVAL TUN ACCESS W/ PORT W/O FL MOD SED  03/20/2022   PERICARDIOCENTESIS N/A 10/03/2021   Procedure: PERICARDIOCENTESIS;  Surgeon: Andres Booze, MD;  Location: [facility];  Service: Cardiovascular;  Laterality: N/A;   PORTA CATH INSERTION N/A 06/06/2021   Procedure: PORTA CATH INSERTION;  Surgeon: Andres Huxley, MD;  Location: [facility];  Service: Cardiovascular;   Laterality: N/A;   SHOULDER ARTHROSCOPY  10/23/2016   Procedure: ARTHROSCOPY SHOULDER WITH DISTAL CLAVICLE EXCISION, PARTIAL ACROMIONECTOMY, AND DEBRIDEMENT;  Surgeon: Andres Leys, MD;  Location: [facility];  Service: Orthopedics;;   TIBIA FRACTURE SURGERY Left 2004   titanium rod, ORIF   Social History   Tobacco Use   Smoking status: Every Day    Packs/day: 0.50    Years: 45.00    Pack years: 22.50    Types: Cigarettes   Smokeless tobacco: Never   Tobacco comments:    11/6- currently smoking 5 cigarettes a day   Vaping Use   Vaping Use: Never used  Substance Use Topics   Alcohol use: Yes    Alcohol/week: 3.0 standard drinks    Types: 3 Glasses of wine per week    Comment: a couple glasses of wine a week   Drug use: No   Social History   Socioeconomic History   Marital status: Married    Spouse name: Andres Mullins   Number of children: Not on file   Years of education: 12   Highest education level: Bachelor's degree (e.g., BA, AB, BS)  Occupational History   Occupation: retired  Tobacco Use   Smoking status: Every Day    Packs/day: 0.50    Years: 45.00    Pack years: 22.50    Types: Cigarettes   Smokeless tobacco: Never   Tobacco comments:    11/6- currently smoking 5 cigarettes a day   Vaping Use   Vaping Use: Never used  Substance and Sexual Activity   Alcohol use: Yes    Alcohol/week: 3.0 standard drinks    Types: 3 Glasses of wine per week    Comment: a couple glasses of wine a week   Drug use: No   Sexual activity: Yes  Other Topics Concern   Not on file  Social History Narrative   Consumes ~4 cups of coffee/day   Social Determinants of Health   Financial Resource Strain: Not on file  Food Insecurity: Not on file  Transportation Needs: Not on file  Physical Activity: Not on file  Stress: Not on file  Social Connections: Not on file  Intimate Partner Violence: Not on file   Family Status  Relation Name Status   Father  Alive   Mother  Other        unsure   Sister  Alive   Brother  Alive   MGM unknown Other   MGF unknown Other   PGM  Deceased       natural cause   PGF  Deceased       natural cause   Neg Hx  (Not Specified)   Family History  Problem Relation Age of Onset   Heart disease Father    Osteoarthritis Father    Alzheimer's disease Father    Cancer Neg Hx    Diabetes Neg Hx    Hypertension Neg Hx    Stroke Neg Hx    COPD Neg Hx  Allergies  Allergen Reactions   Gluten Meal     Gluten intolerance       Review of Systems  Constitutional:  Negative for chills and fever.  Respiratory:  Negative for cough and shortness of breath.   Cardiovascular:  Negative for chest pain.  Neurological:  Negative for weakness.     Objective:     BP 124/78   Pulse 85   Temp (!) 97.5 F (36.4 C)   Resp 16   Ht 5' 6"  (1.676 m)   Wt 167 lb 3.2 oz (75.8 kg)   SpO2 97%   BMI 26.99 kg/m  BP Readings from Last 3 Encounters:  05/03/22 124/78  04/27/22 103/74  04/26/22 100/70   Wt Readings from Last 3 Encounters:  05/03/22 167 lb 3.2 oz (75.8 kg)  04/27/22 168 lb (76.2 kg)  04/26/22 169 lb 2 oz (76.7 kg)      Physical Exam Constitutional:      Appearance: Normal appearance.  HENT:     Head: Normocephalic and atraumatic.  Eyes:     Conjunctiva/sclera: Conjunctivae normal.  Cardiovascular:     Rate and Rhythm: Normal rate and regular rhythm.  Pulmonary:     Effort: Pulmonary effort is normal.     Breath sounds: Normal breath sounds.  Musculoskeletal:     Right lower leg: No edema.     Left lower leg: No edema.  Skin:    General: Skin is warm and dry.  Neurological:     General: No focal deficit present.     Mental Status: He is alert. Mental status is at baseline.  Psychiatric:        Mood and Affect: Mood normal.        Behavior: Behavior normal.    No results found for any visits on 05/03/22.  Last CBC Lab Results  Component Value Date   WBC 9.6 04/27/2022   HGB 15.8 04/27/2022   HCT 46.2  04/27/2022   MCV 88.5 04/27/2022   MCH 30.3 04/27/2022   RDW 14.6 04/27/2022   PLT 233 64/40/3474   Last metabolic panel Lab Results  Component Value Date   GLUCOSE 108 (H) 04/27/2022   NA 137 04/27/2022   K 4.3 04/27/2022   CL 105 04/27/2022   CO2 26 04/27/2022   BUN 11 04/27/2022   CREATININE 0.76 04/27/2022   GFRNONAA >60 04/27/2022   CALCIUM 9.0 04/27/2022   PHOS 3.5 12/27/2021   PROT 6.7 04/27/2022   ALBUMIN 3.9 04/27/2022   BILITOT 0.6 04/27/2022   ALKPHOS 65 04/27/2022   AST 18 04/27/2022   ALT 20 04/27/2022   ANIONGAP 6 04/27/2022   Last lipids Lab Results  Component Value Date   CHOL 194 10/06/2019   HDL 45 10/06/2019   LDLCALC 124 (H) 10/06/2019   TRIG 136 10/06/2019   CHOLHDL 4.3 10/06/2019   Last hemoglobin A1c Lab Results  Component Value Date   HGBA1C 5.5 09/07/2021   Last thyroid functions Lab Results  Component Value Date   TSH 1.237 10/04/2021   Last vitamin D Lab Results  Component Value Date   VD25OH 27 (L) 12/19/2021   Last vitamin B12 and Folate No results found for: VITAMINB12, FOLATE    The 10-year ASCVD risk score (Arnett DK, et al., 2019) is: 19.7%    Assessment & Plan:   1. CLL (chronic lymphocytic leukemia) ([place]): Following with oncology, note from 04/27/2022 reviewed.  Patient's chemotherapy recently changed, now currently on Acalabrutinib 100 mg  BID and tolerating well.  Also on acyclovir 400 mg daily prophylactically.  Planning on getting second shingles vaccine in the next few months.  2. History of pulmonary embolus (PE): Finished anticoagulation therapy in April.  3. Vitamin D deficiency disease: Stable.  Continue multivitamin with vitamin D supplements, plan to recheck vitamin D and cholesterol at follow-up in 6 months.  Return in about 6 months (around 11/02/2022).    Teodora Medici, DO

## 2022-05-09 ENCOUNTER — Other Ambulatory Visit: Payer: Self-pay | Admitting: Pharmacist

## 2022-05-09 ENCOUNTER — Other Ambulatory Visit (HOSPITAL_COMMUNITY): Payer: Self-pay

## 2022-05-09 ENCOUNTER — Ambulatory Visit (INDEPENDENT_AMBULATORY_CARE_PROVIDER_SITE_OTHER): Payer: Medicare PPO | Admitting: Dermatology

## 2022-05-09 DIAGNOSIS — D485 Neoplasm of uncertain behavior of skin: Secondary | ICD-10-CM

## 2022-05-09 DIAGNOSIS — C9110 Chronic lymphocytic leukemia of B-cell type not having achieved remission: Secondary | ICD-10-CM

## 2022-05-09 DIAGNOSIS — L409 Psoriasis, unspecified: Secondary | ICD-10-CM | POA: Diagnosis not present

## 2022-05-09 DIAGNOSIS — L821 Other seborrheic keratosis: Secondary | ICD-10-CM | POA: Diagnosis not present

## 2022-05-09 DIAGNOSIS — D489 Neoplasm of uncertain behavior, unspecified: Secondary | ICD-10-CM

## 2022-05-09 MED ORDER — CALCIPOTRIENE 0.005 % EX CREA: TOPICAL_CREAM | CUTANEOUS | 2 refills | Status: DC

## 2022-05-09 MED ORDER — ACALABRUTINIB MALEATE 100 MG PO TABS
100.0000 mg | ORAL_TABLET | Freq: Two times a day (BID) | ORAL | 0 refills | Status: DC
  Filled 2022-05-09: qty 60, 30d supply, fill #0

## 2022-05-09 MED ORDER — VTAMA 1 % EX CREA: 1.0000 | TOPICAL_CREAM | Freq: Every day | CUTANEOUS | 2 refills | Status: DC

## 2022-05-09 NOTE — Patient Instructions (Addendum)
Biopsy Wound Care Instructions  Leave the original bandage on for 24 hours if possible.  If the bandage becomes soaked or soiled before that time, it is OK to remove it and examine the wound.  A small amount of post-operative bleeding is normal.  If excessive bleeding occurs, remove the bandage, place gauze over the site and apply continuous pressure (no peeking) over the area for 30 minutes. If this does not work, please call our clinic as soon as possible or page your doctor if it is after hours.   Once a day, cleanse the wound with soap and water. It is fine to shower. If a thick crust develops you may use a Q-tip dipped into dilute hydrogen peroxide (mix 1:1 with water) to dissolve it.  Hydrogen peroxide can slow the healing process, so use it only as needed.    After washing, apply petroleum jelly (Vaseline) or an antibiotic ointment if your doctor prescribed one for you, followed by a bandage.    For best healing, the wound should be covered with a layer of ointment at all times. If you are not able to keep the area covered with a bandage to hold the ointment in place, this may mean re-applying the ointment several times a day.  Continue this wound care until the wound has healed and is no longer open.   Itching and mild discomfort is normal during the healing process. However, if you develop pain or severe itching, please call our office.   If you have any discomfort, you can take Tylenol (acetaminophen) or ibuprofen as directed on the bottle. (Please do not take these if you have an allergy to them or cannot take them for another reason).  Some redness, tenderness and white or yellow material in the wound is normal healing.  If the area becomes very sore and red, or develops a thick yellow-green material (pus), it may be infected; please notify us.    If you have stitches, return to clinic as directed to have the stitches removed. You will continue wound care for 2-3 days after the stitches  are removed.   Wound healing continues for up to one year following surgery. It is not unusual to experience pain in the scar from time to time during the interval.  If the pain becomes severe or the scar thickens, you should notify the office.    A slight amount of redness in a scar is expected for the first six months.  After six months, the redness will fade and the scar will soften and fade.  The color difference becomes less noticeable with time.  If there are any problems, return for a post-op surgery check at your earliest convenience.  To improve the appearance of the scar, you can use silicone scar gel, cream, or sheets (such as Mederma or Serica) every night for up to one year. These are available over the counter (without a prescription).  Please call our office at [phone] for any questions or concerns.      Seborrheic Keratosis  What causes seborrheic keratoses? Seborrheic keratoses are harmless, common skin growths that first appear during adult life.  As time goes by, more growths appear.  Some people may develop a large number of them.  Seborrheic keratoses appear on both covered and uncovered body parts.  They are not caused by sunlight.  The tendency to develop seborrheic keratoses can be inherited.  They vary in color from skin-colored to gray, brown, or even black.  They can be either smooth or have a rough, warty surface.   Seborrheic keratoses are superficial and look as if they were stuck on the skin.  Under the microscope this type of keratosis looks like layers upon layers of skin.  That is why at times the top layer may seem to fall off, but the rest of the growth remains and re-grows.    Treatment Seborrheic keratoses do not need to be treated, but can easily be removed in the office.  Seborrheic keratoses often cause symptoms when they rub on clothing or jewelry.  Lesions can be in the way of shaving.  If they become inflamed, they can cause itching, soreness, or  burning.  Removal of a seborrheic keratosis can be accomplished by freezing, burning, or surgery. If any spot bleeds, scabs, or grows rapidly, please return to have it checked, as these can be an indication of a skin cancer.        Due to recent changes in healthcare laws, you may see results of your pathology and/or laboratory studies on MyChart before the doctors have had a chance to review them. We understand that in some cases there may be results that are confusing or concerning to you. Please understand that not all results are received at the same time and often the doctors may need to interpret multiple results in order to provide you with the best plan of care or course of treatment. Therefore, we ask that you please give [place] 2 business days to thoroughly review all your results before contacting the office for clarification. Should we see a critical lab result, you will be contacted sooner.   If You Need Anything After Your Visit  If you have any questions or concerns for your doctor, please call our main line at [phone] and press option 4 to reach your doctor's medical assistant. If no one answers, please leave a voicemail as directed and we will return your call as soon as possible. Messages left after 4 pm will be answered the following business day.   You may also send [place] a message via [place]. We typically respond to MyChart messages within 1-2 business days.  For prescription refills, please ask your pharmacy to contact our office. Our fax number is [phone].  If you have an urgent issue when the clinic is closed that cannot wait until the next business day, you can page your doctor at the number below.    Please note that while we do our best to be available for urgent issues outside of office hours, we are not available 24/7.   If you have an urgent issue and are unable to reach [place], you may choose to seek medical care at your doctor's office, retail clinic, urgent  care center, or emergency room.  If you have a medical emergency, please immediately call 911 or go to the emergency department.  Pager Numbers  - Dr. Nehemiah Massed: [phone]  - Dr. Laurence Ferrari: [phone]  - Dr. Nicole Kindred: [phone]  In the event of inclement weather, please call our main line at [phone] for an update on the status of any delays or closures.  Dermatology Medication Tips: Please keep the boxes that topical medications come in in order to help keep track of the instructions about where and how to use these. Pharmacies typically print the medication instructions only on the boxes and not directly on the medication tubes.   If your medication is too expensive, please contact our office at [phone] option  4 or send [place] a message through [place].   We are unable to tell what your co-pay for medications will be in advance as this is different depending on your insurance coverage. However, we may be able to find a substitute medication at lower cost or fill out paperwork to get insurance to cover a needed medication.   If a prior authorization is required to get your medication covered by your insurance company, please allow [place] 1-2 business days to complete this process.  Drug prices often vary depending on where the prescription is filled and some pharmacies may offer cheaper prices.  The website www.goodrx.com contains coupons for medications through different pharmacies. The prices here do not account for what the cost may be with help from insurance (it may be cheaper with your insurance), but the website can give you the price if you did not use any insurance.  - You can print the associated coupon and take it with your prescription to the pharmacy.  - You may also stop by our office during regular business hours and pick up a GoodRx coupon card.  - If you need your prescription sent electronically to a different pharmacy, notify our office through [facility] or  by phone at [phone] option 4.     Si Usted Necesita Algo Despus de Su Visita  Tambin puede enviarnos un mensaje a travs de Pharmacist, community. Por lo general respondemos a los mensajes de MyChart en el transcurso de 1 a 2 das hbiles.  Para renovar recetas, por favor pida a su farmacia que se ponga en contacto con nuestra oficina. Harland Dingwall de fax es [place] [phone].  Si tiene un asunto urgente cuando la clnica est cerrada y que no puede esperar hasta el siguiente da hbil, puede llamar/localizar a su doctor(a) al nmero que aparece a continuacin.   Por favor, tenga en cuenta que aunque hacemos todo lo posible para estar disponibles para asuntos urgentes fuera del horario de [place], no estamos disponibles las 24 horas del da, los 7 das de la [place].   Si tiene un problema urgente y no puede comunicarse con nosotros, puede optar por buscar atencin mdica  en el consultorio de su doctor(a), en una clnica privada, en un centro de atencin urgente o en una sala de emergencias.  Si tiene Engineering geologist, por favor llame inmediatamente al 911 o vaya a la sala de emergencias.  Nmeros de bper  - Dr. Nehemiah Massed: [phone]  - Dra. Moye: [phone]  - Dra. Nicole Kindred: [phone]  En caso de inclemencias del [place], por favor llame a Johnsie Kindred principal al [phone] para una actualizacin sobre el [place] de cualquier retraso o cierre.  Consejos para la medicacin en dermatologa: Por favor, guarde las cajas en las que vienen los medicamentos de uso tpico para ayudarle a seguir las instrucciones sobre dnde y cmo usarlos. Las farmacias generalmente imprimen las instrucciones del medicamento slo en las cajas y no directamente en los tubos del [place].   Si su medicamento es muy caro, por favor, pngase en contacto con Zigmund Daniel llamando al [phone] y presione la opcin 4 o envenos un mensaje a travs de Pharmacist, community.   No podemos decirle cul ser su  copago por los medicamentos por adelantado ya que esto es diferente dependiendo de la cobertura de su seguro. Sin embargo, es posible que podamos encontrar un medicamento sustituto a Electrical engineer un formulario para que el seguro cubra el medicamento que se considera necesario.  Si se requiere una autorizacin previa para que su compaa de seguros [place] su medicamento, por favor permtanos de 1 a 2 das hbiles para completar este proceso.  Los precios de los medicamentos varan con frecuencia dependiendo del Environmental consultant de dnde se surte la receta y alguna farmacias pueden ofrecer precios ms baratos.  El sitio web www.goodrx.com tiene cupones para medicamentos de Airline pilot. Los precios aqu no tienen en cuenta lo que podra costar con la ayuda del seguro (puede ser ms barato con su seguro), pero el sitio web puede darle el precio si no utiliz Research scientist (physical sciences).  - Puede imprimir el cupn correspondiente y llevarlo con su receta a la farmacia.  - Tambin puede pasar por nuestra oficina durante el horario de atencin regular y Charity fundraiser una tarjeta de cupones de GoodRx.  - Si necesita que su receta se enve electrnicamente a una farmacia diferente, informe a nuestra oficina a travs de MyChart de [place] o por telfono llamando al [phone] y presione la opcin 4.

## 2022-05-09 NOTE — Progress Notes (Signed)
Follow-Up Visit   Subjective  Andres Mullins is a 71 y.o. male who presents for the following: Skin Problem (Patient reports under lower right eyelid noticed a few weeks ago a bump, spot at left forearm that is scaly and hasnt healed. ). 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  He has psoriasis and is getting XTRAC laser.  Some improvement, but spots still there.  The patient has spots, moles and lesions to be evaluated, some may be new or changing and the patient has concerns that these could be cancer.  The following portions of the chart were reviewed this encounter and updated as appropriate:      Review of Systems: No other skin or systemic complaints except as noted in HPI or Assessment and Plan.   Objective  Well appearing patient in no apparent distress; mood and affect are within normal limits.  A focused examination was performed including right lower eyelid, left forearm, b/l lower legs. Relevant physical exam findings are noted in the Assessment and Plan.  b/l lower legs well demarcated erythematous patches with mild scale and thinning   Left Forearm - Anterior 8 mm pink scaly papule        right medial lower eyelid Waxy tan brown stuck on papule        Assessment & Plan  Psoriasis b/l lower legs  Chronic and persistent condition with duration or expected duration over one year. Condition is improving on xtrac laser and topical treatment, but not at goal  Psoriasis is a chronic non-curable, but treatable genetic/hereditary disease that may have other systemic features affecting other organ systems such as joints (Psoriatic Arthritis). It is associated with an increased risk of inflammatory bowel disease, heart disease, non-alcoholic fatty liver disease, and depression.    Start vtama cream 1 % - apply daily to aa's at lower legs for psoriasis  Discussed if not covered could try zoryve cream  Start calcipotriene cream 0.005 % - apply 1 - 2 times qd M-F to  aa's at lower legs for psoriasis  Continue clobetasol cream apply to pink scaly patches on legs qd/bid weekends until clear dsp 60g 2Rf. Avoid face, groin, axilla.  Continue hydrocortisone valerate cream 0.2 % cream to aas face/groin prn flares  Continue Xtrac laser until all plaques resolved.  He will have to take a break from it when he leaves town for 2 months, during which he will try to get natural sunlight to lower legs x 20 min 3x/wk    calcipotriene (DOVONOX) 0.005 % cream - b/l lower legs Apply 1 - 2 times daily to aa of lower legs for psoriasis  Tapinarof (VTAMA) 1 % CREA - b/l lower legs Apply 1 Application topically daily. Apply to aa at lower legs for psoriasis  Related Medications hydrocortisone valerate cream (WESTCORT) 0.2 % APPLY TO AFFECTED AREA TWICE A DAY  clobetasol cream (TEMOVATE) 0.05 % Apply to affected areas rash on legs once to twice daily until improved. Avoid face, groin, axilla.  Neoplasm of uncertain behavior Left Forearm - Anterior  Epidermal / dermal shaving  Lesion diameter (cm):  0.8 Informed consent: discussed and consent obtained   Timeout: patient name, date of birth, surgical site, and procedure verified   Procedure prep:  Patient was prepped and draped in usual sterile fashion Prep type:  Isopropyl alcohol Anesthesia: the lesion was anesthetized in a standard fashion   Anesthetic:  1% lidocaine w/ epinephrine 1-100,000 buffered w/ 8.4% NaHCO3 Instrument used: flexible razor blade  Hemostasis achieved with: pressure, aluminum chloride and electrodesiccation   Outcome: patient tolerated procedure well   Post-procedure details: sterile dressing applied and wound care instructions given   Post-procedure details comment:  Ointment and small bandage applied.  Dressing type: bandage and petrolatum    Specimen 1 - Surgical pathology Differential Diagnosis: Psoriasis r/o scc   Check Margins: No  Psoriasis r/o scc   Seborrheic  keratosis right medial lower eyelid  Reassured benign age-related growth.  Recommend observation.   Discussed if grows any larger could do snip removal of lesion if bothersome     Return if symptoms worsen or fail to improve. I, Ruthell Rummage, CMA, am acting as scribe for Brendolyn Patty, MD.  Documentation: I have reviewed the above documentation for accuracy and completeness, and I agree with the above.  Brendolyn Patty MD

## 2022-05-09 NOTE — Progress Notes (Signed)
Patient is going to be out of town and is requesting a 90 day prescription be sent to [facility].

## 2022-05-10 ENCOUNTER — Other Ambulatory Visit (HOSPITAL_COMMUNITY): Payer: Self-pay

## 2022-05-11 ENCOUNTER — Other Ambulatory Visit (HOSPITAL_COMMUNITY): Payer: Self-pay

## 2022-05-12 ENCOUNTER — Other Ambulatory Visit (HOSPITAL_COMMUNITY): Payer: Self-pay

## 2022-05-15 ENCOUNTER — Telehealth: Payer: Self-pay

## 2022-05-15 ENCOUNTER — Other Ambulatory Visit (HOSPITAL_COMMUNITY): Payer: Self-pay

## 2022-05-15 NOTE — Telephone Encounter (Signed)
-----   Message from Brendolyn Patty, MD sent at 05/15/2022  9:57 AM EDT ----- Skin , left forearm- anterior PSORIASIS   Benign  - please call patient

## 2022-05-15 NOTE — Telephone Encounter (Signed)
Advised pt of bx results/sh ?

## 2022-05-16 ENCOUNTER — Ambulatory Visit (INDEPENDENT_AMBULATORY_CARE_PROVIDER_SITE_OTHER): Payer: Medicare PPO | Admitting: Dermatology

## 2022-05-16 DIAGNOSIS — L400 Psoriasis vulgaris: Secondary | ICD-10-CM

## 2022-05-16 NOTE — Progress Notes (Signed)
Patient here today for xtrac treatment of Psoriasis Vulgaris.    Total Surface Area: 76cm2 Total Energy: 80.64 Service Code: 79444  Johnsie Kindred, RMA  Documentation: I have reviewed the above documentation for accuracy and completeness, and I agree with the above.  Brendolyn Patty MD

## 2022-05-17 ENCOUNTER — Other Ambulatory Visit (HOSPITAL_COMMUNITY): Payer: Self-pay

## 2022-05-17 MED ORDER — ACALABRUTINIB MALEATE 100 MG PO TABS: 100.0000 mg | ORAL_TABLET | Freq: Two times a day (BID) | ORAL | 1 refills | Status: DC

## 2022-05-17 NOTE — Progress Notes (Signed)
Andres Mullins was unable to get the insurance to approve a 90 day fill to cover Andres Mullins while he is in [place]. Andres Mullins is unable to ship anywhere other then [place] and [place]. They will ship him one month supply prior to him leaving.   A prescription for the remaining amount to cover his trip was sent to [facility], who can ship to [place]. Supportive information was faxed to [employer].

## 2022-05-17 NOTE — Addendum Note (Signed)
Addended by: Darl Pikes on: 05/17/2022 09:39 AM   Modules accepted: Orders

## 2022-05-18 ENCOUNTER — Other Ambulatory Visit (HOSPITAL_COMMUNITY): Payer: Self-pay

## 2022-05-19 ENCOUNTER — Emergency Department: Payer: Medicare PPO

## 2022-05-19 ENCOUNTER — Other Ambulatory Visit: Payer: Self-pay

## 2022-05-19 ENCOUNTER — Emergency Department
Admission: EM | Admit: 2022-05-19 | Discharge: 2022-05-19 | Disposition: A | Discharge status: Home / Self Care | Payer: Medicare PPO | Attending: Student in an Organized Health Care Education/Training Program | Admitting: Student in an Organized Health Care Education/Training Program

## 2022-05-19 ENCOUNTER — Telehealth: Payer: Self-pay | Admitting: *Deleted

## 2022-05-19 ENCOUNTER — Inpatient Hospital Stay (HOSPITAL_BASED_OUTPATIENT_CLINIC_OR_DEPARTMENT_OTHER): Payer: Medicare PPO | Admitting: Medical Oncology

## 2022-05-19 ENCOUNTER — Encounter: Payer: Self-pay | Admitting: Emergency Medicine

## 2022-05-19 VITALS — BP 112/82 | HR 95 | Temp 97.5°F | Resp 18

## 2022-05-19 DIAGNOSIS — J449 Chronic obstructive pulmonary disease, unspecified: Secondary | ICD-10-CM | POA: Diagnosis not present

## 2022-05-19 DIAGNOSIS — K449 Diaphragmatic hernia without obstruction or gangrene: Secondary | ICD-10-CM | POA: Diagnosis not present

## 2022-05-19 DIAGNOSIS — R791 Abnormal coagulation profile: Secondary | ICD-10-CM | POA: Diagnosis not present

## 2022-05-19 DIAGNOSIS — R079 Chest pain, unspecified: Secondary | ICD-10-CM

## 2022-05-19 DIAGNOSIS — J90 Pleural effusion, not elsewhere classified: Secondary | ICD-10-CM | POA: Diagnosis not present

## 2022-05-19 DIAGNOSIS — R0789 Other chest pain: Secondary | ICD-10-CM | POA: Diagnosis not present

## 2022-05-19 DIAGNOSIS — Z86718 Personal history of other venous thrombosis and embolism: Secondary | ICD-10-CM

## 2022-05-19 DIAGNOSIS — C9110 Chronic lymphocytic leukemia of B-cell type not having achieved remission: Secondary | ICD-10-CM

## 2022-05-19 DIAGNOSIS — I3139 Other pericardial effusion (noninflammatory): Secondary | ICD-10-CM

## 2022-05-19 DIAGNOSIS — F17200 Nicotine dependence, unspecified, uncomplicated: Secondary | ICD-10-CM | POA: Insufficient documentation

## 2022-05-19 DIAGNOSIS — R519 Headache, unspecified: Secondary | ICD-10-CM | POA: Diagnosis not present

## 2022-05-19 DIAGNOSIS — M2550 Pain in unspecified joint: Secondary | ICD-10-CM | POA: Diagnosis not present

## 2022-05-19 DIAGNOSIS — R0602 Shortness of breath: Secondary | ICD-10-CM

## 2022-05-19 DIAGNOSIS — C9111 Chronic lymphocytic leukemia of B-cell type in remission: Secondary | ICD-10-CM | POA: Diagnosis not present

## 2022-05-19 DIAGNOSIS — E785 Hyperlipidemia, unspecified: Secondary | ICD-10-CM | POA: Diagnosis not present

## 2022-05-19 DIAGNOSIS — R5383 Other fatigue: Secondary | ICD-10-CM | POA: Diagnosis not present

## 2022-05-19 DIAGNOSIS — I2510 Atherosclerotic heart disease of native coronary artery without angina pectoris: Secondary | ICD-10-CM | POA: Diagnosis not present

## 2022-05-19 DIAGNOSIS — I5031 Acute diastolic (congestive) heart failure: Secondary | ICD-10-CM | POA: Diagnosis not present

## 2022-05-19 DIAGNOSIS — R0781 Pleurodynia: Secondary | ICD-10-CM | POA: Diagnosis present

## 2022-05-19 LAB — CBC WITH DIFFERENTIAL/PLATELET
Abs Immature Granulocytes: 0.06 10*3/uL (ref 0.00–0.07)
Basophils Absolute: 0.1 10*3/uL (ref 0.0–0.1)
Basophils Relative: 1 %
Eosinophils Absolute: 0.1 10*3/uL (ref 0.0–0.5)
Eosinophils Relative: 1 %
HCT: 44.4 % (ref 39.0–52.0)
Hemoglobin: 15.7 g/dL (ref 13.0–17.0)
Immature Granulocytes: 1 %
Lymphocytes Relative: 11 %
Lymphs Abs: 1.4 10*3/uL (ref 0.7–4.0)
MCH: 31 pg (ref 26.0–34.0)
MCHC: 35.4 g/dL (ref 30.0–36.0)
MCV: 87.6 fL (ref 80.0–100.0)
Monocytes Absolute: 1.3 10*3/uL — ABNORMAL HIGH (ref 0.1–1.0)
Monocytes Relative: 10 %
Neutro Abs: 9.6 10*3/uL — ABNORMAL HIGH (ref 1.7–7.7)
Neutrophils Relative %: 76 %
Platelets: 244 10*3/uL (ref 150–400)
RBC: 5.07 MIL/uL (ref 4.22–5.81)
RDW: 13.6 % (ref 11.5–15.5)
WBC: 12.5 10*3/uL — ABNORMAL HIGH (ref 4.0–10.5)
nRBC: 0 % (ref 0.0–0.2)

## 2022-05-19 LAB — TROPONIN I (HIGH SENSITIVITY)
Troponin I (High Sensitivity): 11 ng/L (ref <18)
Troponin I (High Sensitivity): 11 ng/L (ref <18)

## 2022-05-19 LAB — COMPREHENSIVE METABOLIC PANEL
ALT: 16 U/L (ref 0–44)
AST: 15 U/L (ref 15–41)
Albumin: 3.9 g/dL (ref 3.5–5.0)
Alkaline Phosphatase: 70 U/L (ref 38–126)
Anion gap: 7 (ref 5–15)
BUN: 9 mg/dL (ref 8–23)
CO2: 23 mmol/L (ref 22–32)
Calcium: 9 mg/dL (ref 8.9–10.3)
Chloride: 103 mmol/L (ref 98–111)
Creatinine, Ser: 0.83 mg/dL (ref 0.61–1.24)
GFR, Estimated: >60 mL/min (ref >60)
Glucose, Bld: 102 mg/dL — ABNORMAL HIGH (ref 70–99)
Potassium: 3.8 mmol/L (ref 3.5–5.1)
Sodium: 133 mmol/L — ABNORMAL LOW (ref 135–145)
Total Bilirubin: 0.8 mg/dL (ref 0.3–1.2)
Total Protein: 7.1 g/dL (ref 6.5–8.1)

## 2022-05-19 LAB — BRAIN NATRIURETIC PEPTIDE: B Natriuretic Peptide: 34.5 pg/mL (ref 0.0–100.0)

## 2022-05-19 LAB — D-DIMER, QUANTITATIVE: D-Dimer, Quant: 0.59 ug/mL-FEU — ABNORMAL HIGH (ref 0.00–0.50)

## 2022-05-19 MED ORDER — IOHEXOL 350 MG/ML SOLN
75.0000 mL | Freq: Once | INTRAVENOUS | Status: AC | PRN
  Administered 2022-05-19: 75 mL via INTRAVENOUS

## 2022-05-19 NOTE — Telephone Encounter (Signed)
[facility] seen him today.

## 2022-05-19 NOTE — ED Triage Notes (Signed)
Pt to ED from cancer center for chest pain since yesterday. Pt states that the pain is in the left upper chest, pain does not radiate and pain is not worse with exertion. Pt states that he did have a PE in February. Pt is no longer on blood thinners. Pt is currently in NAD.

## 2022-05-19 NOTE — ED Provider Notes (Signed)
[facility] Provider Note    Event Date/Time   First MD Initiated Contact with Patient 05/19/22 1312     (approximate)   History   Chest Pain   HPI  Andres Mullins is a 71 y.o. male with a past medical history of CLL, protein C resistance, CHF, pericarditis and pericardial effusion, pulmonary embolism, pleural effusion who presents today for evaluation of chest pain.  Chest pain began yesterday and patient reports feels like a knot on his chest.  Patient reports that this happened to him once before and after an extensive work-up and hospital stay it was deemed that his symptoms were due to his chemotherapeutic agent.  Patient reports that his pain does not radiate.  He reports that he feels mildly short of breath.   He took an oxycodone yesterday and a hydrocodone today which helped mildly.  He has not had any calf pain or tenderness.  No nausea, vomiting, or diaphoresis.  I reviewed the patient's chart.  In November 2022 patient had a large pericardial effusion with bilateral pleural effusions and underwent pericardiocentesis, no tamponade physiology.  Cytology was negative for malignancy.  After his discharge on 11/9 he returned to [facility] ED with increased pleuritic chest pain and CTA demonstrated acute pulmonary embolism that was small in burden.  He was ultimately discharged in 11/22.  He was admitted again from 01/16/2022 until 01/19/2022 for hypoxic respiratory distress requiring supplemental oxygen, and chest x-ray demonstrated bilateral pleural effusions with left basilar opacities.  CTA demonstrated no recurrence of the pulmonary embolism, showed a stable small pericardial effusion, and new bilateral pleural effusions.  Upon follow-up with his oncologist, his Venclexta therapy was discontinued and his symptoms improved. Currently on ACALABRUTINIB 100 mg BID ([place]).  Patient Active Problem List   Diagnosis Date Noted   Pleural effusion due to CHF (congestive  heart failure) (HCC) 01/17/2022   Fever with chills 01/17/2022   Acute diastolic CHF (congestive heart failure) (HCC) 01/16/2022   History of pericarditis 01/16/2022   Tobacco use disorder, continuous 12/19/2021   Pulmonary embolism (HCC) 10/16/2021   Sepsis due to pneumonia (HCC) 10/03/2021   Pericardial effusion 10/03/2021   Oropharyngeal candidiasis 10/03/2021   Hyponatremia 10/03/2021   Acute hypoxemic respiratory failure (HCC) 10/03/2021   Cardiac tamponade 10/03/2021   CLL (chronic lymphocytic leukemia) (HCC) 10/29/2019   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   Welcome to Medicare preventive visit 11/15/2018   Renal lesion 11/12/2018   Prostate enlargement 11/12/2018   Acquired trigger finger 11/12/2018   Trigger middle finger of right hand 01/02/2018   Positive anti-CCP test 12/12/2017   Rheumatoid factor positive 12/12/2017   Trigger finger of both hands 12/12/2017   Bilateral hand pain 05/18/2017   Preventative health care 05/18/2017   Screen for colon cancer 05/18/2017   Coronary artery calcification 01/18/2017   Supraspinatus tendinitis, left 10/22/2016   Impingement syndrome of shoulder region 10/06/2016   Celiac disease 10/05/2016   Atherosclerosis of coronary artery 09/28/2016   Thoracic aortic atherosclerosis (HCC) 09/28/2016   Personal history of tobacco use, presenting hazards to health 09/26/2016   Lipoma of head 10/19/2015   Tendonitis, Achilles, left 10/14/2015   Herpes    Rosacea    Psoriasis    Hyperlipidemia    Vitamin D deficiency disease    Tobacco abuse    Elevated PSA    Elevated C-reactive protein (CRP)           Physical Exam   Triage  Vital Signs: ED Triage Vitals  Enc Vitals Group     BP 05/19/22 1251 109/82     Pulse Rate 05/19/22 1251 95     Resp 05/19/22 1251 16     Temp 05/19/22 1251 98.7 F (37.1 C)     Temp Source 05/19/22 1251 Oral     SpO2 05/19/22 1251 97 %     Weight 05/19/22 1250 167 lb (75.8 kg)      Height 05/19/22 1250 5\' 6"  (1.676 m)     Head Circumference --      Peak Flow --      Pain Score 05/19/22 1249 3     Pain Loc --      Pain Edu? --      Excl. in GC? --     Most recent vital signs: Vitals:   05/19/22 1251 05/19/22 1432  BP: 109/82 110/78  Pulse: 95 90  Resp: 16 16  Temp: 98.7 F (37.1 C)   SpO2: 97% 97%    Physical Exam Vitals and nursing note reviewed.  Constitutional:      General: Awake and alert. No acute distress.    Appearance: Normal appearance. The patient is normal weight.  HENT:     Head: Normocephalic and atraumatic.     Mouth: Mucous membranes are moist.  Eyes:     General: PERRL. Normal EOMs        Right eye: No discharge.        Left eye: No discharge.     Conjunctiva/sclera: Conjunctivae normal.  Cardiovascular:     Rate and Rhythm: Normal rate and regular rhythm.     Pulses: Normal pulses.  Normal pulses in all 4 extremities    Heart sounds: Normal heart sounds Pulmonary:     Effort: Pulmonary effort is normal. No respiratory distress.     Breath sounds: Normal breath sounds.  Abdominal:     Abdomen is soft. There is no abdominal tenderness. No rebound or guarding. No distention. Musculoskeletal:        General: No swelling. Normal range of motion.     Cervical: Normal range of motion and neck supple. JVP No lower extremity edema Skin:    General: Skin is warm and dry.     Capillary Refill: Capillary refill takes less than 2 seconds.     Findings: No rash.  Neurological:     Mental Status: The patient is awake and alert.      ED Results / Procedures / Treatments   Labs (all labs ordered are listed, but only abnormal results are displayed) Labs Reviewed  D-DIMER, QUANTITATIVE - Abnormal; Notable for the following components:      Result Value   D-Dimer, Quant 0.59 (*)    All other components within normal limits  BRAIN NATRIURETIC PEPTIDE  TROPONIN I (HIGH SENSITIVITY)  TROPONIN I (HIGH SENSITIVITY)      EKG     RADIOLOGY I independently reviewed and interpreted imaging and agree with radiologists findings.     PROCEDURES:  Critical Care performed:   Procedures   MEDICATIONS ORDERED IN ED: Medications - No data to display   IMPRESSION / MDM / ASSESSMENT AND PLAN / ED COURSE  I reviewed the triage vital signs and the nursing notes.   Differential diagnosis includes, but is not limited to, pulmonary embolism, cardiopulmonary abnormality, recurrence of pericardial effusion, pericarditis, pleural effusion, acute coronary syndrome, pneumothorax.  Patient presents emergency department awake and alert, hemodynamically stable and afebrile.  He has a normal oxygen saturation of 97% on room air.  Chest x-ray obtained upon arrival demonstrates no acute cardiopulmonary abnormality.  Patient has quite the extensive past medical history.  His wife feels certain that his symptoms are related to his current chemotherapeutic agent.  Labs obtained are overall reassuring.  CBC and BMP obtained at clinic just prior to arrival with mild leukocytosis to 12.5 with reassuring H&H, normal LFTs.  Troponin and BNP within normal limits.  D-dimer is mildly elevated.  Therefore CTA obtained.  Patient passed off to oncoming provider Gala Romney, PA-C while awaiting CTA results and final disposition   Patient's presentation is most consistent with acute presentation with potential threat to life or bodily function.    FINAL CLINICAL IMPRESSION(S) / ED DIAGNOSES   Final diagnoses:  None     Rx / DC Orders   ED Discharge Orders     None        Note:  This document was prepared using Dragon voice recognition software and may include unintentional dictation errors.   Keturah Shavers 05/19/22 1507    Willy Eddy, MD 05/19/22 [phone]

## 2022-05-19 NOTE — ED Notes (Signed)
Cbc and cmp ordered by [facility] today.  Results will be available.

## 2022-05-20 ENCOUNTER — Encounter: Payer: Self-pay | Admitting: Internal Medicine

## 2022-05-22 ENCOUNTER — Encounter: Payer: Self-pay | Admitting: Internal Medicine

## 2022-05-22 ENCOUNTER — Inpatient Hospital Stay (HOSPITAL_BASED_OUTPATIENT_CLINIC_OR_DEPARTMENT_OTHER): Payer: Medicare PPO | Admitting: Internal Medicine

## 2022-05-22 DIAGNOSIS — E785 Hyperlipidemia, unspecified: Secondary | ICD-10-CM | POA: Diagnosis not present

## 2022-05-22 DIAGNOSIS — I2510 Atherosclerotic heart disease of native coronary artery without angina pectoris: Secondary | ICD-10-CM | POA: Diagnosis not present

## 2022-05-22 DIAGNOSIS — R0789 Other chest pain: Secondary | ICD-10-CM | POA: Diagnosis not present

## 2022-05-22 DIAGNOSIS — C9110 Chronic lymphocytic leukemia of B-cell type not having achieved remission: Secondary | ICD-10-CM | POA: Diagnosis not present

## 2022-05-22 DIAGNOSIS — R5383 Other fatigue: Secondary | ICD-10-CM | POA: Diagnosis not present

## 2022-05-22 DIAGNOSIS — J449 Chronic obstructive pulmonary disease, unspecified: Secondary | ICD-10-CM | POA: Diagnosis not present

## 2022-05-22 DIAGNOSIS — M2550 Pain in unspecified joint: Secondary | ICD-10-CM | POA: Diagnosis not present

## 2022-05-22 DIAGNOSIS — K449 Diaphragmatic hernia without obstruction or gangrene: Secondary | ICD-10-CM | POA: Diagnosis not present

## 2022-05-22 DIAGNOSIS — R519 Headache, unspecified: Secondary | ICD-10-CM | POA: Diagnosis not present

## 2022-05-22 MED ORDER — INDOMETHACIN 25 MG PO CAPS: 25.0000 mg | ORAL_CAPSULE | Freq: Three times a day (TID) | ORAL | 0 refills | Status: DC

## 2022-05-22 MED ORDER — OXYCODONE HCL 5 MG PO TABS
5.0000 mg | ORAL_TABLET | Freq: Four times a day (QID) | ORAL | 0 refills | Status: DC | PRN
Start: 2022-05-22 — End: 2023-04-25

## 2022-05-22 MED ORDER — PREDNISONE 20 MG PO TABS: ORAL_TABLET | ORAL | 0 refills | Status: DC

## 2022-05-25 ENCOUNTER — Other Ambulatory Visit (HOSPITAL_COMMUNITY): Payer: Self-pay

## 2022-05-29 ENCOUNTER — Ambulatory Visit: Payer: Medicare PPO | Admitting: Internal Medicine

## 2022-05-29 ENCOUNTER — Other Ambulatory Visit: Payer: Medicare PPO

## 2022-05-29 ENCOUNTER — Inpatient Hospital Stay: Payer: Medicare PPO | Attending: Internal Medicine

## 2022-05-29 ENCOUNTER — Inpatient Hospital Stay (HOSPITAL_BASED_OUTPATIENT_CLINIC_OR_DEPARTMENT_OTHER): Payer: Medicare PPO | Admitting: Internal Medicine

## 2022-05-29 ENCOUNTER — Encounter: Payer: Self-pay | Admitting: Internal Medicine

## 2022-05-29 DIAGNOSIS — Z79624 Long term (current) use of inhibitors of nucleotide synthesis: Secondary | ICD-10-CM | POA: Insufficient documentation

## 2022-05-29 DIAGNOSIS — K449 Diaphragmatic hernia without obstruction or gangrene: Secondary | ICD-10-CM | POA: Diagnosis not present

## 2022-05-29 DIAGNOSIS — F17210 Nicotine dependence, cigarettes, uncomplicated: Secondary | ICD-10-CM | POA: Diagnosis not present

## 2022-05-29 DIAGNOSIS — R161 Splenomegaly, not elsewhere classified: Secondary | ICD-10-CM | POA: Insufficient documentation

## 2022-05-29 DIAGNOSIS — Z86711 Personal history of pulmonary embolism: Secondary | ICD-10-CM | POA: Diagnosis not present

## 2022-05-29 DIAGNOSIS — R590 Localized enlarged lymph nodes: Secondary | ICD-10-CM | POA: Insufficient documentation

## 2022-05-29 DIAGNOSIS — R5383 Other fatigue: Secondary | ICD-10-CM | POA: Diagnosis not present

## 2022-05-29 DIAGNOSIS — Z79899 Other long term (current) drug therapy: Secondary | ICD-10-CM | POA: Insufficient documentation

## 2022-05-29 DIAGNOSIS — R0789 Other chest pain: Secondary | ICD-10-CM | POA: Insufficient documentation

## 2022-05-29 DIAGNOSIS — Z818 Family history of other mental and behavioral disorders: Secondary | ICD-10-CM | POA: Insufficient documentation

## 2022-05-29 DIAGNOSIS — I2510 Atherosclerotic heart disease of native coronary artery without angina pectoris: Secondary | ICD-10-CM | POA: Diagnosis not present

## 2022-05-29 DIAGNOSIS — Z9049 Acquired absence of other specified parts of digestive tract: Secondary | ICD-10-CM | POA: Insufficient documentation

## 2022-05-29 DIAGNOSIS — J449 Chronic obstructive pulmonary disease, unspecified: Secondary | ICD-10-CM | POA: Diagnosis not present

## 2022-05-29 DIAGNOSIS — Z8249 Family history of ischemic heart disease and other diseases of the circulatory system: Secondary | ICD-10-CM | POA: Diagnosis not present

## 2022-05-29 DIAGNOSIS — Z7952 Long term (current) use of systemic steroids: Secondary | ICD-10-CM | POA: Insufficient documentation

## 2022-05-29 DIAGNOSIS — Z8261 Family history of arthritis: Secondary | ICD-10-CM | POA: Insufficient documentation

## 2022-05-29 DIAGNOSIS — C9110 Chronic lymphocytic leukemia of B-cell type not having achieved remission: Secondary | ICD-10-CM | POA: Diagnosis not present

## 2022-05-29 DIAGNOSIS — E785 Hyperlipidemia, unspecified: Secondary | ICD-10-CM | POA: Insufficient documentation

## 2022-05-29 LAB — CBC WITH DIFFERENTIAL/PLATELET
Abs Immature Granulocytes: 0.85 10*3/uL — ABNORMAL HIGH (ref 0.00–0.07)
Basophils Absolute: 0.1 10*3/uL (ref 0.0–0.1)
Basophils Relative: 1 %
Eosinophils Absolute: 0 10*3/uL (ref 0.0–0.5)
Eosinophils Relative: 0 %
HCT: 44.7 % (ref 39.0–52.0)
Hemoglobin: 15.6 g/dL (ref 13.0–17.0)
Immature Granulocytes: 6 %
Lymphocytes Relative: 6 %
Lymphs Abs: 0.9 10*3/uL (ref 0.7–4.0)
MCH: 30.5 pg (ref 26.0–34.0)
MCHC: 34.9 g/dL (ref 30.0–36.0)
MCV: 87.3 fL (ref 80.0–100.0)
Monocytes Absolute: 0.2 10*3/uL (ref 0.1–1.0)
Monocytes Relative: 1 %
Neutro Abs: 13.2 10*3/uL — ABNORMAL HIGH (ref 1.7–7.7)
Neutrophils Relative %: 86 %
Platelets: 433 10*3/uL — ABNORMAL HIGH (ref 150–400)
RBC: 5.12 MIL/uL (ref 4.22–5.81)
RDW: 14.2 % (ref 11.5–15.5)
Smear Review: NORMAL
WBC: 15.2 10*3/uL — ABNORMAL HIGH (ref 4.0–10.5)
nRBC: 0 % (ref 0.0–0.2)

## 2022-05-29 LAB — COMPREHENSIVE METABOLIC PANEL
ALT: 22 U/L (ref 0–44)
AST: 29 U/L (ref 15–41)
Albumin: 3.4 g/dL — ABNORMAL LOW (ref 3.5–5.0)
Alkaline Phosphatase: 66 U/L (ref 38–126)
Anion gap: 9 (ref 5–15)
BUN: 17 mg/dL (ref 8–23)
CO2: 23 mmol/L (ref 22–32)
Calcium: 8.9 mg/dL (ref 8.9–10.3)
Chloride: 105 mmol/L (ref 98–111)
Creatinine, Ser: 0.94 mg/dL (ref 0.61–1.24)
GFR, Estimated: >60 mL/min (ref >60)
Glucose, Bld: 149 mg/dL — ABNORMAL HIGH (ref 70–99)
Potassium: 4.3 mmol/L (ref 3.5–5.1)
Sodium: 137 mmol/L (ref 135–145)
Total Bilirubin: 0.4 mg/dL (ref 0.3–1.2)
Total Protein: 6.2 g/dL — ABNORMAL LOW (ref 6.5–8.1)

## 2022-05-29 LAB — LACTATE DEHYDROGENASE: LDH: 155 U/L (ref 98–192)

## 2022-05-29 NOTE — Progress Notes (Signed)
Would like to discuss [place].

## 2022-05-29 NOTE — Progress Notes (Signed)
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   AUG 2021-Head and neck: The brain demonstrates symmetric appearing diffuse physiologic uptake without focal finding to suggest abnormality. In the left posterior and medial maxillary cavity, there is an area of mucoid deposition seen within SUV max of 28.8. This was seen in the previous study of November. The specific bony erosion, clear-cut soft tissue lesion is not detected. This probably represents a chronic sinus disease focus. The bilateral optic nerves and extraocular muscles to the largest nodes are seen on axial image 98. 1 now demonstrates a short axis measurement of 3.3 cm and the other more medially, a short axis measurement of 2.5 cm. Demonstrate symmetric increased uptake. The left, much greater than right, jugulodigastric and supraclavicular nodes have increased significantly in size and avidity since the previous study. Both of these lymph nodes demonstrate an SUV max of 3.9. No largest lymph node in the right subclavicular space is seen on axial image 104, demonstrating an SUV max of 3.3, and a short axis measurement of 1.4 cm.   On axial image 102, a right  paratracheal lymph node is again identified with increased avidity, located immediately posterior to the right thyroid pole. This area was noted previously, but now increased avidity is seen, with an SUV max of 8.6 in this area. Multiple smaller, and less avid lymph nodes scattered throughout the left greater than right neck are seen, as well as supraclavicular and infraclavicular areas.   Thorax: Significant axillary lymph nodes have increased significantly in size. These are also left greater than right, with the largest on the left seen on image #114 with a short axis measurement of 2.2 cm and an SUV max of 4.3. The largest on the right as seen on image 111, demonstrating a short axis of 1.4 cm and an SUV max of 4.0. Too numerous to count smaller and similarly avid lymph nodes in both axillae are also noted. These are all larger than previously seen and in general, more avid.  Numerous small AP window and lesser mediastinal lymph nodes are seen with minimal increased avidity, appearing similar in size and avidity to the previous study. No other significant mediastinal adenopathy. No hilar adenopathy detected. These likely also represent lymphoproliferative disease.  Hyperinflation of COPD, bibasilar dependent atelectasis, some paucity of lung tissue and biapical scarring are noted without other focal pulmonary mass or nodule, or acute pulmonary parenchymal disease. Physiologic uptake in the left heart is seen, as expected. Small hiatal hernia is again identified.   Abdomen pelvis: Physiologic uptake in the liver, gut and urinary tract is again noted. No focal area of asymmetric uptake in the structures is seen. The spleen also demonstrates generalized increased uptake and stable splenomegaly, up to 14.2 cm. On image 198, a preaortic lymph node is detected of  increased size since the previous study, now measuring up to 1.2 cm short axis.  Several large lymph nodes on image 203 of the mesentery are noted.  The largest of these, on image 201 and ureters up to 1.8 cm short axis. Despite the fact that measurable avidity is not detected in these are the surrounding lymph nodes, these are doubtless involved in the lymphoproliferative disease described, and appear larger. Multiple smaller lymph nodes scattered throughout the mesentery are also seen.  Grossly enlarged right greater than left common iliac chain lymph nodes are again seen, throughout the pelvis and into the inguinal areas bilaterally. The most avid on the right is seen on image #260 to, demonstrates a short axis of 2.5 cm, and an SUV max of 4.9. The largest on the left is identified image #272, demonstrates a short axis of 2.0 cm, and an SUV max of 4.7. These are similar to the previously noted values.  Right greater than left inguinal adenopathy is seen, with the largest lymph node seen on the right image 276, demonstrating a short axis of 1.5 cm and an SUV max of 3.9. This is larger in size and avidity than previously seen. There are bilateral enlarged lymph nodes associated with the external iliac vessels on image 277. These measure 2.0 cm on the right in short axis, with an SUV max of 3.9.  On the left, image 278, the external iliac lymph node is larger at 1.8 cm short axis, but with an SUV max of only 3.8. Lesser avid and smaller lymph nodes on both sides are also detected.   Musculoskeletal: Scattered, insignificant uptake is detected in the muscles especially in the bilateral joints, without focal finding to suggest lymphoproliferative disease.   IMPRESSIONS:  Significant increase in numbers, short axis size, and to a large extent, avidity -in the lymph nodes of the left greater than right neck, supraclavicular and infraclavicular areas, bilateral axillary lymph nodes and common iliac chain, external iliac chain and inguinal lymph nodes, consistent with worsening lymphoproliferative disease. The mediastinal lymph nodes seen are still present and  unchanged in size or [significantly] in avidity.    [place]- Bx- SLL/CLL.July 2022-Discussed with Dr. Asquith-oncologist, [place]-reviewed the pathology of the lymph node biopsy in 2021-positive for SLL; negative for cyclin D1/1114 translocation.    # July 20th, 2022- GAZYVA #1 Aletha Halim plan ading VENATOCLAX after 2 cycles/de-bulking] s/p GAYZYVA [x6]- Feb 2023 - cycle #1  Venatoclax- x1 month- HELD sec to repeated hospitalization  # DISCONTINUED Marijo Sanes [sec to intolearnce]  # PE acute [NOV 6834] provoked--currently s/p  Eliquis x 6 months [end of  April 2023].    # MAY 1st, 2023- START ACALABRUTINIB 100 mg BID  # December 2022-pericarditis/effusion needing pericardiocentesis; FEB 2023-acute respiratory failure-hospitalization-? Etiology [s/p ID; Pul; Dr.A; cards- [facility]]  # Lung cancer screening- on [facility]  # SURVIVORSHIP:   # GENETICS:   DIAGNOSIS:   STAGE:         ;  GOALS:  CURRENT/MOST RECENT THERAPY :       CLL (chronic lymphocytic leukemia) ([place])  10/29/2019 Initial Diagnosis   CLL (chronic lymphocytic leukemia) ([place])   06/15/2021 -  Chemotherapy   Patient is on Treatment Plan : LYMPHOMA CLL/SLL Venetoclax + Obinutuzumab q28d        INTERVAL HISTORY: Ambulating independently.  Accompanied by his wife.   A pleasant 71 year-old male patient with CLL/SLL currently most recently acalabrutinib [since May 1st-currently on hold] is here for follow-up.  At last visit  approximately week ago patient was started on prednisone/indomethacin for pleuritic pain chest wall pain/shortness of breath.  Patient noted to have improvement of his chest pain.  Also noted to have improvement of his breathing.   Denies any wheezing.  Denies any worsening cough.   Review of Systems  Constitutional:  Positive for malaise/fatigue. Negative for chills, diaphoresis, fever and weight loss.  HENT:  Negative for nosebleeds and sore throat.   Eyes:  Negative for double vision.  Respiratory:  Negative  for cough, hemoptysis, sputum production, shortness of breath and wheezing.   Cardiovascular:  Negative for chest pain, palpitations, orthopnea and leg swelling.  Gastrointestinal:  Negative for abdominal pain, blood in stool, constipation, diarrhea, heartburn, melena, nausea and vomiting.  Genitourinary:  Negative for dysuria, frequency and urgency.  Musculoskeletal:  Negative for back pain and joint pain.  Skin: Negative.  Negative for itching and rash.  Neurological:  Negative for dizziness, tingling, focal weakness, weakness and headaches.  Endo/Heme/Allergies:  Does not bruise/bleed easily.  Psychiatric/Behavioral:  Negative for depression. The patient is not nervous/anxious and does not have insomnia.      PAST MEDICAL HISTORY :  Past Medical History:  Diagnosis Date   Atherosclerosis of coronary artery 09/28/2016   Chest CT Nov 2017   Atrial tachycardia (H[facility])    CLL (chronic lymphocytic leukemia) (H[facility])    Elevated C-reactive protein (CRP)    Elevated PSA    Fractured sternum 1999   Herpes    History of rib fracture 1999   multiple   Hyperlipidemia    Psoriasis    Pulmonary embolism ([place])    Rheumatoid factor positive 02/2014   Rosacea    Thoracic aortic atherosclerosis ([place]) 09/28/2016   Chest CT Nov 2017   Tobacco abuse    Vitamin D deficiency disease     PAST SURGICAL HISTORY :   Past Surgical History:  Procedure Laterality Date   APPENDECTOMY  1974   IR REMOVAL TUN ACCESS W/ PORT W/O FL MOD SED  03/20/2022   PERICARDIOCENTESIS N/A 10/03/2021   Procedure: PERICARDIOCENTESIS;  Surgeon: Jettie Booze, MD;  Location: [place] [facility];  Service: Cardiovascular;  Laterality: N/A;   PORTA CATH INSERTION N/A 06/06/2021   Procedure: PORTA CATH INSERTION;  Surgeon: Algernon Huxley, MD;  Location: [place] [facility];  Service: Cardiovascular;  Laterality: N/A;   SHOULDER ARTHROSCOPY  10/23/2016   Procedure: ARTHROSCOPY SHOULDER WITH DISTAL CLAVICLE EXCISION, PARTIAL  ACROMIONECTOMY, AND DEBRIDEMENT;  Surgeon: Earnestine Leys, MD;  Location: [facility];  Service: Orthopedics;;   TIBIA FRACTURE SURGERY Left 2004   titanium rod, ORIF    FAMILY HISTORY :   Family History  Problem Relation Age of Onset   Heart disease Father    Osteoarthritis Father    Alzheimer's disease Father    Cancer Neg Hx    Diabetes Neg Hx    Hypertension Neg Hx    Stroke Neg Hx    COPD Neg Hx     SOCIAL HISTORY:   Social History   Tobacco Use   Smoking status: Every Day    Packs/day: 0.50    Years: 45.00    Total pack years: 22.50    Types: Cigarettes   Smokeless tobacco: Never   Tobacco comments:    11/6- currently smoking 5 cigarettes a day   Vaping Use   Vaping Use: Never used  Substance Use Topics   Alcohol use: Yes    Alcohol/week: 3.0 standard drinks of alcohol  Types: 3 Glasses of wine per week    Comment: a couple glasses of wine a week   Drug use: No    ALLERGIES:  is allergic to gluten meal.  MEDICATIONS:  Current Outpatient Medications  Medication Sig Dispense Refill   acalabrutinib maleate 100 MG TABS Take 100 mg by mouth 2 (two) times daily. 60 tablet 1   acyclovir (ZOVIRAX) 400 MG tablet TAKE 1 TABLET BY MOUTH TWICE A DAY 180 tablet 1   calcipotriene (DOVONOX) 0.005 % cream Apply 1 - 2 times daily to aa of lower legs for psoriasis 120 g 2   cholecalciferol (VITAMIN D3) 25 MCG (1000 UNIT) tablet Take 1,000 Units by mouth daily.     clobetasol cream (TEMOVATE) 0.05 % Apply to affected areas rash on legs once to twice daily until improved. Avoid face, groin, axilla. 60 g 2   hydrocortisone valerate cream (WESTCORT) 0.2 % APPLY TO AFFECTED AREA TWICE A DAY 45 g 0   indomethacin (INDOCIN) 25 MG capsule Take 1 capsule (25 mg total) by mouth 3 (three) times daily with meals. 45 capsule 0   oxyCODONE (OXY IR/ROXICODONE) 5 MG immediate release tablet Take 1 tablet (5 mg total) by mouth every 6 (six) hours as needed for moderate pain or severe pain. 30  tablet 0   predniSONE (DELTASONE) 20 MG tablet Take 3 pills once a day x 1 week; do not stop until next visit. 60 tablet 0   Tapinarof (VTAMA) 1 % CREA Apply 1 Application topically daily. Apply to aa at lower legs for psoriasis 60 g 2   No current facility-administered medications for this visit.    PHYSICAL EXAMINATION: ECOG PERFORMANCE STATUS: 0 - Asymptomatic  BP 136/88 (BP Location: Left Arm, Patient Position: Sitting, Cuff Size: Normal)   Pulse 94   Temp (!) 97.1 F (36.2 C) (Tympanic)   Ht 5' 6"  (1.676 m)   Wt 170 lb 12.8 oz (77.5 kg)   SpO2 98%   BMI 27.57 kg/m   Filed Weights   05/29/22 1503  Weight: 170 lb 12.8 oz (77.5 kg)    No significant lymphadenopathy lymphadenopathy in the neck/underarm.  Physical Exam HENT:     Head: Normocephalic and atraumatic.     Mouth/Throat:     Pharynx: No oropharyngeal exudate.  Eyes:     Pupils: Pupils are equal, round, and reactive to light.  Cardiovascular:     Rate and Rhythm: Normal rate and regular rhythm.  Pulmonary:     Effort: No respiratory distress.     Breath sounds: No wheezing.  Abdominal:     General: Bowel sounds are normal. There is no distension.     Palpations: Abdomen is soft. There is no mass.     Tenderness: There is no abdominal tenderness. There is no guarding or rebound.  Musculoskeletal:        General: No tenderness. Normal range of motion.     Cervical back: Normal range of motion and neck supple.  Skin:    General: Skin is warm.  Neurological:     Mental Status: He is alert and oriented to person, place, and time.  Psychiatric:        Mood and Affect: Affect normal.     LABORATORY DATA:  I have reviewed the data as listed    Component Value Date/Time   NA 137 05/29/2022 1501   NA 139 10/27/2021 0857   K 4.3 05/29/2022 1501   CL 105 05/29/2022 1501  CO2 23 05/29/2022 1501   GLUCOSE 149 (H) 05/29/2022 1501   BUN 17 05/29/2022 1501   BUN 16 10/27/2021 0857   CREATININE 0.94  05/29/2022 1501   CREATININE 0.84 10/06/2019 0000   CALCIUM 8.9 05/29/2022 1501   PROT 6.2 (L) 05/29/2022 1501   ALBUMIN 3.4 (L) 05/29/2022 1501   AST 29 05/29/2022 1501   ALT 22 05/29/2022 1501   ALKPHOS 66 05/29/2022 1501   BILITOT 0.4 05/29/2022 1501   GFRNONAA >60 05/29/2022 1501   GFRNONAA 91 10/06/2019 0000   GFRAA >60 12/26/2019 0842   GFRAA 106 10/06/2019 0000    No results found for: "SPEP", "UPEP"  Lab Results  Component Value Date   WBC 15.2 (H) 05/29/2022   NEUTROABS PENDING 05/29/2022   HGB 15.6 05/29/2022   HCT 44.7 05/29/2022   MCV 87.3 05/29/2022   PLT 433 (H) 05/29/2022      Chemistry      Component Value Date/Time   NA 137 05/29/2022 1501   NA 139 10/27/2021 0857   K 4.3 05/29/2022 1501   CL 105 05/29/2022 1501   CO2 23 05/29/2022 1501   BUN 17 05/29/2022 1501   BUN 16 10/27/2021 0857   CREATININE 0.94 05/29/2022 1501   CREATININE 0.84 10/06/2019 0000      Component Value Date/Time   CALCIUM 8.9 05/29/2022 1501   ALKPHOS 66 05/29/2022 1501   AST 29 05/29/2022 1501   ALT 22 05/29/2022 1501   BILITOT 0.4 05/29/2022 1501       RADIOGRAPHIC STUDIES: I have personally reviewed the radiological images as listed and agreed with the findings in the report. No results found.   ASSESSMENT & PLAN:   .CLL (chronic lymphocytic leukemia) ([place]) # SLL/CLL- Patient currently s/p cycle #6 of Gazyva [#6 in Jan 2023]; most recently on  acalabrutinib 100 mg twice a day [since start of May 2023]- tolearting well except mild fague/headachs/es joint aches; except for recent evaluation in the emergency room for chest pain shortness of breath.  # Given the  improvement of patient's symptoms recommend continue RE-STARTING acalbrutinib 100 mg twice a day. See below.   # Chest pressure/difficulty breathing/pleuritic pain- JUNE 23rd- CT scan CTA-no evidence of PE; no pericardial effusion-suspect pleurisy.  Improvement noted on prednisone 3 pills a day.  Recommend  taper prednisone- take prednisone 2 pills a day for the next 1 week; 1 pill a day-do not stop until further instructed. Continue  indomethacin 25 mg BID 1 week and then twice daily as needed.  # Continue  shingles/TLS prophylaxis: Acyclovir- [stop in July 2023; 6 months from [facility] January, 2023]- STABLE.  # Active smoker: recommend quitting smoking.   # IV access: PIV; mediport explanted.   # DISPOSITION: # follow up in July 18th- MD; no labs--Dr.B               No orders of the defined types were placed in this encounter.           Cammie Sickle, MD 05/29/2022 3:45 PM

## 2022-05-29 NOTE — Assessment & Plan Note (Addendum)
#   SLL/CLL- Patient currently s/p cycle #6 of Gazyva [#6 in Jan 2023]; most recently on  acalabrutinib 100 mg twice a day [since start of May 2023]- tolearting well except mild fague/headachs/es joint aches; except for recent evaluation in the emergency room for chest pain shortness of breath.  # Given the  improvement of patient's symptoms recommend continue RE-STARTING acalbrutinib 100 mg twice a day. See below.   # Chest pressure/difficulty breathing/pleuritic pain- JUNE 23rd- CT scan CTA-no evidence of PE; no pericardial effusion-suspect pleurisy.  Improvement noted on prednisone 3 pills a day.  Recommend taper prednisone- take prednisone 2 pills a day for the next 1 week; 1 pill a day-do not stop until further instructed. Continue  indomethacin 25 mg BID 1 week and then twice daily as needed.  # Continue  shingles/TLS prophylaxis: Acyclovir- [stop in July 2023; 6 months from [facility] January, 2023]- STABLE.  # Active smoker: recommend quitting smoking.   # IV access: PIV; mediport explanted.   # DISPOSITION: # follow up in July 18th- MD; no labs--Dr.B

## 2022-05-29 NOTE — Patient Instructions (Addendum)
#  Take prednisone 2 pills a day for the next 1 week; 1 pill a day-do not stop until further instructed.   #Take indomethacin 1 pill twice a day x1 for 1 week and then twice a day as needed.   #Okay to restart Calqu[place].

## 2022-06-06 ENCOUNTER — Telehealth: Payer: TRICARE For Life (TFL) | Admitting: Nurse Practitioner

## 2022-06-07 ENCOUNTER — Other Ambulatory Visit (HOSPITAL_COMMUNITY): Payer: Self-pay

## 2022-06-12 ENCOUNTER — Other Ambulatory Visit: Payer: Self-pay | Admitting: Pharmacist

## 2022-06-12 ENCOUNTER — Other Ambulatory Visit (HOSPITAL_COMMUNITY): Payer: Self-pay

## 2022-06-12 DIAGNOSIS — C9110 Chronic lymphocytic leukemia of B-cell type not having achieved remission: Secondary | ICD-10-CM

## 2022-06-12 MED ORDER — CALQUENCE 100 MG PO TABS
100.0000 mg | ORAL_TABLET | Freq: Two times a day (BID) | ORAL | 2 refills | Status: DC
  Filled 2022-06-12: qty 60, 30d supply, fill #0
  Filled 2022-07-11: qty 60, 30d supply, fill #1
  Filled 2022-08-07: qty 60, 30d supply, fill #2

## 2022-06-13 ENCOUNTER — Encounter: Payer: Self-pay | Admitting: Internal Medicine

## 2022-06-13 ENCOUNTER — Inpatient Hospital Stay (HOSPITAL_BASED_OUTPATIENT_CLINIC_OR_DEPARTMENT_OTHER): Payer: Medicare PPO | Admitting: Internal Medicine

## 2022-06-13 DIAGNOSIS — R0789 Other chest pain: Secondary | ICD-10-CM | POA: Diagnosis not present

## 2022-06-13 DIAGNOSIS — C9110 Chronic lymphocytic leukemia of B-cell type not having achieved remission: Secondary | ICD-10-CM | POA: Diagnosis not present

## 2022-06-13 DIAGNOSIS — R161 Splenomegaly, not elsewhere classified: Secondary | ICD-10-CM | POA: Diagnosis not present

## 2022-06-13 DIAGNOSIS — R5383 Other fatigue: Secondary | ICD-10-CM | POA: Diagnosis not present

## 2022-06-13 DIAGNOSIS — K449 Diaphragmatic hernia without obstruction or gangrene: Secondary | ICD-10-CM | POA: Diagnosis not present

## 2022-06-13 DIAGNOSIS — I2510 Atherosclerotic heart disease of native coronary artery without angina pectoris: Secondary | ICD-10-CM | POA: Diagnosis not present

## 2022-06-13 DIAGNOSIS — R590 Localized enlarged lymph nodes: Secondary | ICD-10-CM | POA: Diagnosis not present

## 2022-06-13 DIAGNOSIS — E785 Hyperlipidemia, unspecified: Secondary | ICD-10-CM | POA: Diagnosis not present

## 2022-06-13 DIAGNOSIS — J449 Chronic obstructive pulmonary disease, unspecified: Secondary | ICD-10-CM | POA: Diagnosis not present

## 2022-06-13 MED ORDER — INDOMETHACIN 25 MG PO CAPS: 25.0000 mg | ORAL_CAPSULE | Freq: Two times a day (BID) | ORAL | 0 refills | Status: DC | PRN

## 2022-06-13 NOTE — Assessment & Plan Note (Addendum)
#   SLL/CLL-patient currently on acalabrutinib 100 mg twice a day [since start of May 2023]- tolerating well except mild fague/headachs/es joint aches; except for recent [JUNE 2023] evaluation in the emergency room for chest pain shortness of breath.  # Given the  improvement of patient's symptoms continue acalbrutinib 100 mg twice a day. See below.   # Chest pressure/difficulty breathing/pleuritic pain- JUNE 23rd- CT scan CTA-no evidence of PE; no pericardial effusion-suspect pleurisy- currently on prednisone 1/2  pill a day x1 week; and then 1/2 pill every other day; and STOP.   Continue  indomethacin 25 BID as needed  # Continue  shingles/TLS prophylaxis: Acyclovir- [stop in July 2023; 6 months from [facility] January, 2023]- STABLE.  # Active smoker: recommend quitting smoking.   # IV access: PIV; mediport explanted.   # DISPOSITION: # follow up in 5-6 weeks MD; labs- cbc/cmp; [facility]-Dr.B

## 2022-06-13 NOTE — Progress Notes (Signed)
C/o general tiredness and fatigue. Not sleeping well.

## 2022-06-13 NOTE — Progress Notes (Signed)
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   AUG 2021-Head and neck: The brain demonstrates symmetric appearing diffuse physiologic uptake without focal finding to suggest abnormality. In the left posterior and medial maxillary cavity, there is an area of mucoid deposition seen within SUV max of 28.8. This was seen in the previous study of November. The specific bony erosion, clear-cut soft tissue lesion is not detected. This probably represents a chronic sinus disease focus. The bilateral optic nerves and extraocular muscles to the largest nodes are seen on axial image 98. 1 now demonstrates a short axis measurement of 3.3 cm and the other more medially, a short axis measurement of 2.5 cm. Demonstrate symmetric increased uptake. The left, much greater than right, jugulodigastric and supraclavicular nodes have increased significantly in size and avidity since the previous study. Both of these lymph nodes demonstrate an SUV max of 3.9. No largest lymph node in the right subclavicular space is seen on axial image 104, demonstrating an SUV max of 3.3, and a short axis measurement of 1.4 cm.   On axial image 102, a right  paratracheal lymph node is again identified with increased avidity, located immediately posterior to the right thyroid pole. This area was noted previously, but now increased avidity is seen, with an SUV max of 8.6 in this area. Multiple smaller, and less avid lymph nodes scattered throughout the left greater than right neck are seen, as well as supraclavicular and infraclavicular areas.   Thorax: Significant axillary lymph nodes have increased significantly in size. These are also left greater than right, with the largest on the left seen on image #114 with a short axis measurement of 2.2 cm and an SUV max of 4.3. The largest on the right as seen on image 111, demonstrating a short axis of 1.4 cm and an SUV max of 4.0. Too numerous to count smaller and similarly avid lymph nodes in both axillae are also noted. These are all larger than previously seen and in general, more avid.  Numerous small AP window and lesser mediastinal lymph nodes are seen with minimal increased avidity, appearing similar in size and avidity to the previous study. No other significant mediastinal adenopathy. No hilar adenopathy detected. These likely also represent lymphoproliferative disease.  Hyperinflation of COPD, bibasilar dependent atelectasis, some paucity of lung tissue and biapical scarring are noted without other focal pulmonary mass or nodule, or acute pulmonary parenchymal disease. Physiologic uptake in the left heart is seen, as expected. Small hiatal hernia is again identified.   Abdomen pelvis: Physiologic uptake in the liver, gut and urinary tract is again noted. No focal area of asymmetric uptake in the structures is seen. The spleen also demonstrates generalized increased uptake and stable splenomegaly, up to 14.2 cm. On image 198, a preaortic lymph node is detected of  increased size since the previous study, now measuring up to 1.2 cm short axis.  Several large lymph nodes on image 203 of the mesentery are noted.  The largest of these, on image 201 and ureters up to 1.8 cm short axis. Despite the fact that measurable avidity is not detected in these are the surrounding lymph nodes, these are doubtless involved in the lymphoproliferative disease described, and appear larger. Multiple smaller lymph nodes scattered throughout the mesentery are also seen.  Grossly enlarged right greater than left common iliac chain lymph nodes are again seen, throughout the pelvis and into the inguinal areas bilaterally. The most avid on the right is seen on image #260 to, demonstrates a short axis of 2.5 cm, and an SUV max of 4.9. The largest on the left is identified image #272, demonstrates a short axis of 2.0 cm, and an SUV max of 4.7. These are similar to the previously noted values.  Right greater than left inguinal adenopathy is seen, with the largest lymph node seen on the right image 276, demonstrating a short axis of 1.5 cm and an SUV max of 3.9. This is larger in size and avidity than previously seen. There are bilateral enlarged lymph nodes associated with the external iliac vessels on image 277. These measure 2.0 cm on the right in short axis, with an SUV max of 3.9.  On the left, image 278, the external iliac lymph node is larger at 1.8 cm short axis, but with an SUV max of only 3.8. Lesser avid and smaller lymph nodes on both sides are also detected.   Musculoskeletal: Scattered, insignificant uptake is detected in the muscles especially in the bilateral joints, without focal finding to suggest lymphoproliferative disease.   IMPRESSIONS:  Significant increase in numbers, short axis size, and to a large extent, avidity -in the lymph nodes of the left greater than right neck, supraclavicular and infraclavicular areas, bilateral axillary lymph nodes and common iliac chain, external iliac chain and inguinal lymph nodes, consistent with worsening lymphoproliferative disease. The mediastinal lymph nodes seen are still present and  unchanged in size or [significantly] in avidity.    [place]- Bx- SLL/CLL.July 2022-Discussed with Dr. Asquith-oncologist, [place]-reviewed the pathology of the lymph node biopsy in 2021-positive for SLL; negative for cyclin D1/1114 translocation.    # July 20th, 2022- GAZYVA #1 Aletha Halim plan ading VENATOCLAX after 2 cycles/de-bulking] s/p GAYZYVA [x6]- Feb 2023 - cycle #1  Venatoclax- x1 month- HELD sec to repeated hospitalization  # DISCONTINUED Marijo Sanes [sec to into[facility]]  # PE acute [NOV 8119] provoked--currently s/p  Eliquis x 6 months [end of  April 2023].    # MAY 1st, 2023- START ACALABRUTINIB 100 mg BID  # December 2022-pericarditis/effusion needing pericardiocentesis; FEB 2023-acute respiratory failure-hospitalization-? Etiology [s/p ID; Pul; Dr.A; cards- [facility]]  # Lung cancer screening- on [facility]  # SURVIVORSHIP:   # GENETICS:   DIAGNOSIS:   STAGE:         ;  GOALS:  CURRENT/MOST RECENT THERAPY :       CLL (chronic lymphocytic leukemia) ([place])  10/29/2019 Initial Diagnosis   CLL (chronic lymphocytic leukemia) ([place])   06/15/2021 -  Chemotherapy   Patient is on Treatment Plan : LYMPHOMA CLL/SLL Venetoclax + Obinutuzumab q28d        INTERVAL HISTORY: Ambulating independently.  Alone.   A pleasant 71 year-old male patient with CLL/SLL currently most currently on acalabrutinib is here for follow-up.  Patient was restarted back on acalabrutinib because of recent hospital  visit.  Patient is currently on prednisone/indomethacin-tapering dose.  No further episodes of allergic chest pain or shortness of breath.  Patient noted to have improvement of his chest pain.  Also noted to have improvement of his breathing.   Denies any wheezing.  Denies any worsening cough.   Review of Systems  Constitutional:  Positive for malaise/fatigue. Negative for chills, diaphoresis, fever and weight loss.  HENT:  Negative for nosebleeds and sore throat.   Eyes:  Negative for double vision.   Respiratory:  Negative for cough, hemoptysis, sputum production, shortness of breath and wheezing.   Cardiovascular:  Negative for chest pain, palpitations, orthopnea and leg swelling.  Gastrointestinal:  Negative for abdominal pain, blood in stool, constipation, diarrhea, heartburn, melena, nausea and vomiting.  Genitourinary:  Negative for dysuria, frequency and urgency.  Musculoskeletal:  Negative for back pain and joint pain.  Skin: Negative.  Negative for itching and rash.  Neurological:  Negative for dizziness, tingling, focal weakness, weakness and headaches.  Endo/Heme/Allergies:  Does not bruise/bleed easily.  Psychiatric/Behavioral:  Negative for depression. The patient is not nervous/anxious and does not have insomnia.      PAST MEDICAL HISTORY :  Past Medical History:  Diagnosis Date   Atherosclerosis of coronary artery 09/28/2016   Chest CT Nov 2017   Atrial tachycardia (HCC)    CLL (chronic lymphocytic leukemia) (H[facility])    Elevated C-reactive protein (CRP)    Elevated PSA    Fractured sternum 1999   Herpes    History of rib fracture 1999   multiple   Hyperlipidemia    Psoriasis    Pulmonary embolism ([place])    Rheumatoid factor positive 02/2014   Rosacea    Thoracic aortic atherosclerosis ([place]) 09/28/2016   Chest CT Nov 2017   Tobacco abuse    Vitamin D deficiency disease     PAST SURGICAL HISTORY :   Past Surgical History:  Procedure Laterality Date   APPENDECTOMY  1974   IR REMOVAL TUN ACCESS W/ PORT W/O FL MOD SED  03/20/2022   PERICARDIOCENTESIS N/A 10/03/2021   Procedure: PERICARDIOCENTESIS;  Surgeon: Jettie Booze, MD;  Location: [facility];  Service: Cardiovascular;  Laterality: N/A;   PORTA CATH INSERTION N/A 06/06/2021   Procedure: PORTA CATH INSERTION;  Surgeon: Algernon Huxley, MD;  Location: [facility];  Service: Cardiovascular;  Laterality: N/A;   SHOULDER ARTHROSCOPY  10/23/2016   Procedure: ARTHROSCOPY SHOULDER WITH DISTAL  CLAVICLE EXCISION, PARTIAL ACROMIONECTOMY, AND DEBRIDEMENT;  Surgeon: Earnestine Leys, MD;  Location: [facility];  Service: Orthopedics;;   TIBIA FRACTURE SURGERY Left 2004   titanium rod, ORIF    FAMILY HISTORY :   Family History  Problem Relation Age of Onset   Heart disease Father    Osteoarthritis Father    Alzheimer's disease Father    Cancer Neg Hx    Diabetes Neg Hx    Hypertension Neg Hx    Stroke Neg Hx    COPD Neg Hx     SOCIAL HISTORY:   Social History   Tobacco Use   Smoking status: Every Day    Packs/day: 0.50    Years: 45.00    Total pack years: 22.50    Types: Cigarettes   Smokeless tobacco: Never   Tobacco comments:    11/6- currently smoking 5 cigarettes a day   Vaping Use   Vaping Use: Never used  Substance Use Topics   Alcohol use: Yes    Alcohol/week: 3.0  standard drinks of alcohol    Types: 3 Glasses of wine per week    Comment: a couple glasses of wine a week   Drug use: No    ALLERGIES:  is allergic to gluten meal.  MEDICATIONS:  Current Outpatient Medications  Medication Sig Dispense Refill   acalabrutinib maleate (CALQUENCE) 100 MG TABS Take 100 mg by mouth 2 (two) times daily. 60 tablet 2   acyclovir (ZOVIRAX) 400 MG tablet TAKE 1 TABLET BY MOUTH TWICE A DAY 180 tablet 1   calcipotriene (DOVONOX) 0.005 % cream Apply 1 - 2 times daily to aa of lower legs for psoriasis 120 g 2   cholecalciferol (VITAMIN D3) 25 MCG (1000 UNIT) tablet Take 1,000 Units by mouth daily.     clobetasol cream (TEMOVATE) 0.05 % Apply to affected areas rash on legs once to twice daily until improved. Avoid face, groin, axilla. 60 g 2   hydrocortisone valerate cream (WESTCORT) 0.2 % APPLY TO AFFECTED AREA TWICE A DAY 45 g 0   oxyCODONE (OXY IR/ROXICODONE) 5 MG immediate release tablet Take 1 tablet (5 mg total) by mouth every 6 (six) hours as needed for moderate pain or severe pain. 30 tablet 0   predniSONE (DELTASONE) 20 MG tablet Take 3 pills once a day x 1 week; do not  stop until next visit. 60 tablet 0   Tapinarof (VTAMA) 1 % CREA Apply 1 Application topically daily. Apply to aa at lower legs for psoriasis 60 g 2   indomethacin (INDOCIN) 25 MG capsule Take 1 capsule (25 mg total) by mouth 2 (two) times daily as needed. 60 capsule 0   No current facility-administered medications for this visit.    PHYSICAL EXAMINATION: ECOG PERFORMANCE STATUS: 0 - Asymptomatic  BP 107/77 (BP Location: Left Arm, Patient Position: Sitting, Cuff Size: Normal)   Pulse 81   Temp (!) 95.9 F (35.5 C) (Tympanic)   Ht 5' 6"  (1.676 m)   Wt 168 lb 6.4 oz (76.4 kg)   SpO2 98%   BMI 27.18 kg/m   Filed Weights   06/13/22 0943  Weight: 168 lb 6.4 oz (76.4 kg)    No significant lymphadenopathy lymphadenopathy in the neck/underarm.  Physical Exam HENT:     Head: Normocephalic and atraumatic.     Mouth/Throat:     Pharynx: No oropharyngeal exudate.  Eyes:     Pupils: Pupils are equal, round, and reactive to light.  Cardiovascular:     Rate and Rhythm: Normal rate and regular rhythm.  Pulmonary:     Effort: No respiratory distress.     Breath sounds: No wheezing.  Abdominal:     General: Bowel sounds are normal. There is no distension.     Palpations: Abdomen is soft. There is no mass.     Tenderness: There is no abdominal tenderness. There is no guarding or rebound.  Musculoskeletal:        General: No tenderness. Normal range of motion.     Cervical back: Normal range of motion and neck supple.  Skin:    General: Skin is warm.  Neurological:     Mental Status: He is alert and oriented to person, place, and time.  Psychiatric:        Mood and Affect: Affect normal.     LABORATORY DATA:  I have reviewed the data as listed    Component Value Date/Time   NA 137 05/29/2022 1501   NA 139 10/27/2021 0857   K 4.3 05/29/2022  1501   CL 105 05/29/2022 1501   CO2 23 05/29/2022 1501   GLUCOSE 149 (H) 05/29/2022 1501   BUN 17 05/29/2022 1501   BUN 16 10/27/2021  0857   CREATININE 0.94 05/29/2022 1501   CREATININE 0.84 10/06/2019 0000   CALCIUM 8.9 05/29/2022 1501   PROT 6.2 (L) 05/29/2022 1501   ALBUMIN 3.4 (L) 05/29/2022 1501   AST 29 05/29/2022 1501   ALT 22 05/29/2022 1501   ALKPHOS 66 05/29/2022 1501   BILITOT 0.4 05/29/2022 1501   GFRNONAA >60 05/29/2022 1501   GFRNONAA 91 10/06/2019 0000   GFRAA >60 12/26/2019 0842   GFRAA 106 10/06/2019 0000    No results found for: "SPEP", "UPEP"  Lab Results  Component Value Date   WBC 15.2 (H) 05/29/2022   NEUTROABS 13.2 (H) 05/29/2022   HGB 15.6 05/29/2022   HCT 44.7 05/29/2022   MCV 87.3 05/29/2022   PLT 433 (H) 05/29/2022      Chemistry      Component Value Date/Time   NA 137 05/29/2022 1501   NA 139 10/27/2021 0857   K 4.3 05/29/2022 1501   CL 105 05/29/2022 1501   CO2 23 05/29/2022 1501   BUN 17 05/29/2022 1501   BUN 16 10/27/2021 0857   CREATININE 0.94 05/29/2022 1501   CREATININE 0.84 10/06/2019 0000      Component Value Date/Time   CALCIUM 8.9 05/29/2022 1501   ALKPHOS 66 05/29/2022 1501   AST 29 05/29/2022 1501   ALT 22 05/29/2022 1501   BILITOT 0.4 05/29/2022 1501       RADIOGRAPHIC STUDIES: I have personally reviewed the radiological images as listed and agreed with the findings in the report. No results found.   ASSESSMENT & PLAN:   .CLL (chronic lymphocytic leukemia) ([place]) # SLL/CLL-patient currently on acalabrutinib 100 mg twice a day [since start of May 2023]- tolerating well except mild fague/headachs/es joint aches; except for recent [JUNE 2023] evaluation in the emergency room for chest pain shortness of breath.  # Given the  improvement of patient's symptoms continue acalbrutinib 100 mg twice a day. See below.   # Chest pressure/difficulty breathing/pleuritic pain- JUNE 23rd- CT scan CTA-no evidence of PE; no pericardial effusion-suspect pleurisy- currently on prednisone 1/2  pill a day x1 week; and then 1/2 pill every other day; and STOP.   Continue   indomethacin 25 BID as needed  # Continue  shingles/TLS prophylaxis: Acyclovir- [stop in July 2023; 6 months from [facility] January, 2023]- STABLE.  # Active smoker: recommend quitting smoking.   # IV access: PIV; mediport explanted.   # DISPOSITION: # follow up in 5-6 weeks MD; labs- cbc/cmp; LDH-Dr.B               No orders of the defined types were placed in this encounter.           Cammie Sickle, MD 06/13/2022 12:33 PM

## 2022-06-14 ENCOUNTER — Other Ambulatory Visit (HOSPITAL_COMMUNITY): Payer: Self-pay

## 2022-06-19 ENCOUNTER — Other Ambulatory Visit: Payer: Self-pay

## 2022-06-20 ENCOUNTER — Other Ambulatory Visit (HOSPITAL_COMMUNITY): Payer: Self-pay

## 2022-06-27 ENCOUNTER — Other Ambulatory Visit: Payer: Self-pay

## 2022-07-10 ENCOUNTER — Other Ambulatory Visit (HOSPITAL_COMMUNITY): Payer: Self-pay

## 2022-07-11 ENCOUNTER — Other Ambulatory Visit (HOSPITAL_COMMUNITY): Payer: Self-pay

## 2022-07-18 ENCOUNTER — Encounter: Payer: Self-pay | Admitting: Internal Medicine

## 2022-07-18 ENCOUNTER — Inpatient Hospital Stay: Payer: Medicare PPO | Attending: Internal Medicine

## 2022-07-18 ENCOUNTER — Inpatient Hospital Stay (HOSPITAL_BASED_OUTPATIENT_CLINIC_OR_DEPARTMENT_OTHER): Payer: Medicare PPO | Admitting: Internal Medicine

## 2022-07-18 DIAGNOSIS — Z7289 Other problems related to lifestyle: Secondary | ICD-10-CM | POA: Insufficient documentation

## 2022-07-18 DIAGNOSIS — Z79899 Other long term (current) drug therapy: Secondary | ICD-10-CM | POA: Diagnosis not present

## 2022-07-18 DIAGNOSIS — R5383 Other fatigue: Secondary | ICD-10-CM | POA: Diagnosis not present

## 2022-07-18 DIAGNOSIS — Z8249 Family history of ischemic heart disease and other diseases of the circulatory system: Secondary | ICD-10-CM | POA: Diagnosis not present

## 2022-07-18 DIAGNOSIS — C9110 Chronic lymphocytic leukemia of B-cell type not having achieved remission: Secondary | ICD-10-CM

## 2022-07-18 DIAGNOSIS — Z818 Family history of other mental and behavioral disorders: Secondary | ICD-10-CM | POA: Diagnosis not present

## 2022-07-18 DIAGNOSIS — G4700 Insomnia, unspecified: Secondary | ICD-10-CM | POA: Diagnosis not present

## 2022-07-18 DIAGNOSIS — E785 Hyperlipidemia, unspecified: Secondary | ICD-10-CM | POA: Diagnosis not present

## 2022-07-18 DIAGNOSIS — F17210 Nicotine dependence, cigarettes, uncomplicated: Secondary | ICD-10-CM | POA: Insufficient documentation

## 2022-07-18 DIAGNOSIS — Z8261 Family history of arthritis: Secondary | ICD-10-CM | POA: Diagnosis not present

## 2022-07-18 DIAGNOSIS — G479 Sleep disorder, unspecified: Secondary | ICD-10-CM | POA: Insufficient documentation

## 2022-07-18 LAB — CBC WITH DIFFERENTIAL/PLATELET
Abs Immature Granulocytes: 0.06 10*3/uL (ref 0.00–0.07)
Basophils Absolute: 0.1 10*3/uL (ref 0.0–0.1)
Basophils Relative: 1 %
Eosinophils Absolute: 0.1 10*3/uL (ref 0.0–0.5)
Eosinophils Relative: 1 %
HCT: 44.2 % (ref 39.0–52.0)
Hemoglobin: 15.2 g/dL (ref 13.0–17.0)
Immature Granulocytes: 1 %
Lymphocytes Relative: 18 %
Lymphs Abs: 1.7 10*3/uL (ref 0.7–4.0)
MCH: 31 pg (ref 26.0–34.0)
MCHC: 34.4 g/dL (ref 30.0–36.0)
MCV: 90.2 fL (ref 80.0–100.0)
Monocytes Absolute: 1.1 10*3/uL — ABNORMAL HIGH (ref 0.1–1.0)
Monocytes Relative: 11 %
Neutro Abs: 6.5 10*3/uL (ref 1.7–7.7)
Neutrophils Relative %: 68 %
Platelets: 252 10*3/uL (ref 150–400)
RBC: 4.9 MIL/uL (ref 4.22–5.81)
RDW: 14.4 % (ref 11.5–15.5)
WBC: 9.5 10*3/uL (ref 4.0–10.5)
nRBC: 0 % (ref 0.0–0.2)

## 2022-07-18 LAB — COMPREHENSIVE METABOLIC PANEL
ALT: 17 U/L (ref 0–44)
AST: 18 U/L (ref 15–41)
Albumin: 4 g/dL (ref 3.5–5.0)
Alkaline Phosphatase: 65 U/L (ref 38–126)
Anion gap: 6 (ref 5–15)
BUN: 12 mg/dL (ref 8–23)
CO2: 26 mmol/L (ref 22–32)
Calcium: 9.5 mg/dL (ref 8.9–10.3)
Chloride: 107 mmol/L (ref 98–111)
Creatinine, Ser: 0.79 mg/dL (ref 0.61–1.24)
GFR, Estimated: >60 mL/min (ref >60)
Glucose, Bld: 75 mg/dL (ref 70–99)
Potassium: 4.1 mmol/L (ref 3.5–5.1)
Sodium: 139 mmol/L (ref 135–145)
Total Bilirubin: 0.6 mg/dL (ref 0.3–1.2)
Total Protein: 6.8 g/dL (ref 6.5–8.1)

## 2022-07-18 LAB — LACTATE DEHYDROGENASE: LDH: 137 U/L (ref 98–192)

## 2022-07-18 NOTE — Progress Notes (Signed)
C/o general fatigue and not able to sleep as long, gets 6 hours.

## 2022-07-18 NOTE — Assessment & Plan Note (Addendum)
#   SLL/CLL-patient currently on acalabrutinib 100 mg twice a day [since start of May 2023]- tolerating well except mild-moderate fatigue/insominia. Continue acalbrutinib.  No F denies of any progression.  Labs within normal limits.  # Insomnia: ? Related Acalabrutinib. Recommend melatonin 2-3 mg/day.   # Continue  shingles/TLS prophylaxis: Acyclovir- [stop in July 2023; 6 months from [facility] January, 2023]- STABLE.  # Active smoker: recommend quitting smoking.   # IV access: PIV; mediport explanted.   # Vaccines: ok with shingrix # [first in April, 2023]; ok with covid/flu/RSV vaccines.   # DISPOSITION: # referral to CARE program re: fatigue # follow up in 6 weeks MD; labs- cbc/cmp;URIC ACID; LDH; Quantitative immunoglobulin-Dr.B

## 2022-07-18 NOTE — Progress Notes (Signed)
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   AUG 2021-Head and neck: The brain demonstrates symmetric appearing diffuse physiologic uptake without focal finding to suggest abnormality. In the left posterior and medial maxillary cavity, there is an area of mucoid deposition seen within SUV max of 28.8. This was seen in the previous study of November. The specific bony erosion, clear-cut soft tissue lesion is not detected. This probably represents a chronic sinus disease focus. The bilateral optic nerves and extraocular muscles to the largest nodes are seen on axial image 98. 1 now demonstrates a short axis measurement of 3.3 cm and the other more medially, a short axis measurement of 2.5 cm. Demonstrate symmetric increased uptake. The left, much greater than right, jugulodigastric and supraclavicular nodes have increased significantly in size and avidity since the previous study. Both of these lymph nodes demonstrate an SUV max of 3.9. No largest lymph node in the right subclavicular space is seen on axial image 104, demonstrating an SUV max of 3.3, and a short axis measurement of 1.4 cm.   On axial image 102, a right  paratracheal lymph node is again identified with increased avidity, located immediately posterior to the right thyroid pole. This area was noted previously, but now increased avidity is seen, with an SUV max of 8.6 in this area. Multiple smaller, and less avid lymph nodes scattered throughout the left greater than right neck are seen, as well as supraclavicular and infraclavicular areas.   Thorax: Significant axillary lymph nodes have increased significantly in size. These are also left greater than right, with the largest on the left seen on image #114 with a short axis measurement of 2.2 cm and an SUV max of 4.3. The largest on the right as seen on image 111, demonstrating a short axis of 1.4 cm and an SUV max of 4.0. Too numerous to count smaller and similarly avid lymph nodes in both axillae are also noted. These are all larger than previously seen and in general, more avid.  Numerous small AP window and lesser mediastinal lymph nodes are seen with minimal increased avidity, appearing similar in size and avidity to the previous study. No other significant mediastinal adenopathy. No hilar adenopathy detected. These likely also represent lymphoproliferative disease.  Hyperinflation of COPD, bibasilar dependent atelectasis, some paucity of lung tissue and biapical scarring are noted without other focal pulmonary mass or nodule, or acute pulmonary parenchymal disease. Physiologic uptake in the left heart is seen, as expected. Small hiatal hernia is again identified.   Abdomen pelvis: Physiologic uptake in the liver, gut and urinary tract is again noted. No focal area of asymmetric uptake in the structures is seen. The spleen also demonstrates generalized increased uptake and stable splenomegaly, up to 14.2 cm. On image 198, a preaortic lymph node is detected of  increased size since the previous study, now measuring up to 1.2 cm short axis.  Several large lymph nodes on image 203 of the mesentery are noted.  The largest of these, on image 201 and ureters up to 1.8 cm short axis. Despite the fact that measurable avidity is not detected in these are the surrounding lymph nodes, these are doubtless involved in the lymphoproliferative disease described, and appear larger. Multiple smaller lymph nodes scattered throughout the mesentery are also seen.  Grossly enlarged right greater than left common iliac chain lymph nodes are again seen, throughout the pelvis and into the inguinal areas bilaterally. The most avid on the right is seen on image #260 to, demonstrates a short axis of 2.5 cm, and an SUV max of 4.9. The largest on the left is identified image #272, demonstrates a short axis of 2.0 cm, and an SUV max of 4.7. These are similar to the previously noted values.  Right greater than left inguinal adenopathy is seen, with the largest lymph node seen on the right image 276, demonstrating a short axis of 1.5 cm and an SUV max of 3.9. This is larger in size and avidity than previously seen. There are bilateral enlarged lymph nodes associated with the external iliac vessels on image 277. These measure 2.0 cm on the right in short axis, with an SUV max of 3.9.  On the left, image 278, the external iliac lymph node is larger at 1.8 cm short axis, but with an SUV max of only 3.8. Lesser avid and smaller lymph nodes on both sides are also detected.   Musculoskeletal: Scattered, insignificant uptake is detected in the muscles especially in the bilateral joints, without focal finding to suggest lymphoproliferative disease.   IMPRESSIONS:  Significant increase in numbers, short axis size, and to a large extent, avidity -in the lymph nodes of the left greater than right neck, supraclavicular and infraclavicular areas, bilateral axillary lymph nodes and common iliac chain, external iliac chain and inguinal lymph nodes, consistent with worsening lymphoproliferative disease. The mediastinal lymph nodes seen are still present and  unchanged in size or [significantly] in avidity.    [place]- Bx- SLL/CLL.July 2022-Discussed with Dr. Asquith-oncologist, [place]-reviewed the pathology of the lymph node biopsy in 2021-positive for SLL; negative for cyclin D1/1114 translocation.    # July 20th, 2022- GAZYVA #1 Aletha Halim plan ading VENATOCLAX after 2 cycles/de-bulking] s/p GAYZYVA [x6]- Feb 2023 - cycle #1  Venatoclax- x1 month- HELD sec to repeated hospitalization  # DISCONTINUED Marijo Sanes [sec to intolearnce]  # PE acute [NOV 1610] provoked--currently s/p  Eliquis x 6 months [end of  April 2023].    # MAY 1st, 2023- START ACALABRUTINIB 100 mg BID  # December 2022-pericarditis/effusion needing pericardiocentesis; FEB 2023-acute respiratory failure-hospitalization-? Etiology [s/p ID; Pul; Dr.A; cards- [facility]]  # Lung cancer screening- on [facility]  # SURVIVORSHIP:   # GENETICS:   DIAGNOSIS:   STAGE:         ;  GOALS:  CURRENT/MOST RECENT THERAPY :       CLL (chronic lymphocytic leukemia) ([place])  10/29/2019 Initial Diagnosis   CLL (chronic lymphocytic leukemia) ([place])   06/15/2021 -  Chemotherapy   Patient is on Treatment Plan : LYMPHOMA CLL/SLL Venetoclax + Obinutuzumab q28d        INTERVAL HISTORY: Ambulating independently.  Alone.   A pleasant 71 year-old male patient with CLL/SLL currently most currently on acalabrutinib is here for follow-up.  No further hospitalizations since last visit. Complains of worsening fatigue.  Lack of motivation. Difficulty sleeping- now sleeps 5-6 hours.   Denies any wheezing.  Denies any worsening cough.   Review of Systems  Constitutional:  Positive for malaise/fatigue. Negative for chills, diaphoresis, fever and weight loss.  HENT:  Negative for nosebleeds and sore throat.   Eyes:  Negative for double vision.  Respiratory:  Negative for cough, hemoptysis, sputum production, shortness of breath and wheezing.   Cardiovascular:  Negative for chest pain, palpitations, orthopnea and leg  swelling.  Gastrointestinal:  Negative for abdominal pain, blood in stool, constipation, diarrhea, heartburn, melena, nausea and vomiting.  Genitourinary:  Negative for dysuria, frequency and urgency.  Musculoskeletal:  Negative for back pain and joint pain.  Skin: Negative.  Negative for itching and rash.  Neurological:  Negative for dizziness, tingling, focal weakness, weakness and headaches.  Endo/Heme/Allergies:  Does not bruise/bleed easily.  Psychiatric/Behavioral:  Negative for depression. The patient is not nervous/anxious and does not have insomnia.      PAST MEDICAL HISTORY :  Past Medical History:  Diagnosis Date   Atherosclerosis of coronary artery 09/28/2016   Chest CT Nov 2017   Atrial tachycardia (HCC)    CLL (chronic lymphocytic leukemia) (HCC)    Elevated C-reactive protein (CRP)    Elevated PSA    Fractured sternum 1999   Herpes    History of rib fracture 1999   multiple   Hyperlipidemia    Psoriasis    Pulmonary embolism ([place])    Rheumatoid factor positive 02/2014   Rosacea    Thoracic aortic atherosclerosis ([place]) 09/28/2016   Chest CT Nov 2017   Tobacco abuse    Vitamin D deficiency disease     PAST SURGICAL HISTORY :   Past Surgical History:  Procedure Laterality Date   APPENDECTOMY  1974   IR REMOVAL TUN ACCESS W/ PORT W/O FL MOD SED  03/20/2022   PERICARDIOCENTESIS N/A 10/03/2021   Procedure: PERICARDIOCENTESIS;  Surgeon: Jettie Booze, MD;  Location: [place] [facility];  Service: Cardiovascular;  Laterality: N/A;   PORTA CATH INSERTION N/A 06/06/2021   Procedure: PORTA CATH INSERTION;  Surgeon: Algernon Huxley, MD;  Location: [place] [facility];  Service: Cardiovascular;  Laterality: N/A;   SHOULDER ARTHROSCOPY  10/23/2016   Procedure: ARTHROSCOPY SHOULDER WITH DISTAL CLAVICLE EXCISION, PARTIAL ACROMIONECTOMY, AND DEBRIDEMENT;  Surgeon: Earnestine Leys, MD;  Location: [facility];  Service: Orthopedics;;   TIBIA FRACTURE SURGERY Left 2004    titanium rod, ORIF    FAMILY HISTORY :   Family History  Problem Relation Age of Onset   Heart disease Father    Osteoarthritis Father    Alzheimer's disease Father    Cancer Neg Hx    Diabetes Neg Hx    Hypertension Neg Hx    Stroke Neg Hx    COPD Neg Hx     SOCIAL HISTORY:   Social History   Tobacco Use   Smoking status: Every Day    Packs/day: 0.50    Years: 45.00    Total pack years: 22.50    Types: Cigarettes   Smokeless tobacco: Never   Tobacco comments:    11/6- currently smoking 5 cigarettes a day   Vaping Use   Vaping Use: Never used  Substance Use Topics   Alcohol use: Yes    Alcohol/week: 3.0 standard drinks of alcohol    Types: 3 Glasses of wine per week    Comment: a couple glasses of wine a week   Drug use: No  ALLERGIES:  is allergic to gluten meal.  MEDICATIONS:  Current Outpatient Medications  Medication Sig Dispense Refill   acalabrutinib maleate (CALQUENCE) 100 MG TABS Take 100 mg by mouth 2 (two) times daily. 60 tablet 2   cholecalciferol (VITAMIN D3) 25 MCG (1000 UNIT) tablet Take 1,000 Units by mouth daily.     indomethacin (INDOCIN) 25 MG capsule Take 1 capsule (25 mg total) by mouth 2 (two) times daily as needed. 60 capsule 0   oxyCODONE (OXY IR/ROXICODONE) 5 MG immediate release tablet Take 1 tablet (5 mg total) by mouth every 6 (six) hours as needed for moderate pain or severe pain. 30 tablet 0   Tapinarof (VTAMA) 1 % CREA Apply 1 Application topically daily. Apply to aa at lower legs for psoriasis 60 g 2   acyclovir (ZOVIRAX) 400 MG tablet TAKE 1 TABLET BY MOUTH TWICE A DAY 180 tablet 1   calcipotriene (DOVONOX) 0.005 % cream Apply 1 - 2 times daily to aa of lower legs for psoriasis 120 g 2   clobetasol cream (TEMOVATE) 0.05 % Apply to affected areas rash on legs once to twice daily until improved. Avoid face, groin, axilla. 60 g 2   hydrocortisone valerate cream (WESTCORT) 0.2 % APPLY TO AFFECTED AREA TWICE A DAY 45 g 0   predniSONE  (DELTASONE) 20 MG tablet Take 3 pills once a day x 1 week; do not stop until next visit. 60 tablet 0   No current facility-administered medications for this visit.    PHYSICAL EXAMINATION: ECOG PERFORMANCE STATUS: 0 - Asymptomatic  BP 104/77 (BP Location: Left Arm, Patient Position: Sitting, Cuff Size: Normal)   Pulse 77   Temp (!) 96.7 F (35.9 C) (Tympanic)   Ht 5' 6"  (1.676 m)   Wt 169 lb 12.8 oz (77 kg)   SpO2 100%   BMI 27.41 kg/m   Filed Weights   07/18/22 1443  Weight: 169 lb 12.8 oz (77 kg)    No significant lymphadenopathy lymphadenopathy in the neck/underarm.  Physical Exam HENT:     Head: Normocephalic and atraumatic.     Mouth/Throat:     Pharynx: No oropharyngeal exudate.  Eyes:     Pupils: Pupils are equal, round, and reactive to light.  Cardiovascular:     Rate and Rhythm: Normal rate and regular rhythm.  Pulmonary:     Effort: No respiratory distress.     Breath sounds: No wheezing.  Abdominal:     General: Bowel sounds are normal. There is no distension.     Palpations: Abdomen is soft. There is no mass.     Tenderness: There is no abdominal tenderness. There is no guarding or rebound.  Musculoskeletal:        General: No tenderness. Normal range of motion.     Cervical back: Normal range of motion and neck supple.  Skin:    General: Skin is warm.  Neurological:     Mental Status: He is alert and oriented to person, place, and time.  Psychiatric:        Mood and Affect: Affect normal.     LABORATORY DATA:  I have reviewed the data as listed    Component Value Date/Time   NA 139 07/18/2022 1441   NA 139 10/27/2021 0857   K 4.1 07/18/2022 1441   CL 107 07/18/2022 1441   CO2 26 07/18/2022 1441   GLUCOSE 75 07/18/2022 1441   BUN 12 07/18/2022 1441   BUN 16 10/27/2021 0857  CREATININE 0.79 07/18/2022 1441   CREATININE 0.84 10/06/2019 0000   CALCIUM 9.5 07/18/2022 1441   PROT 6.8 07/18/2022 1441   ALBUMIN 4.0 07/18/2022 1441   AST 18  07/18/2022 1441   ALT 17 07/18/2022 1441   ALKPHOS 65 07/18/2022 1441   BILITOT 0.6 07/18/2022 1441   GFRNONAA >60 07/18/2022 1441   GFRNONAA 91 10/06/2019 0000   GFRAA >60 12/26/2019 0842   GFRAA 106 10/06/2019 0000    No results found for: "SPEP", "UPEP"  Lab Results  Component Value Date   WBC 9.5 07/18/2022   NEUTROABS 6.5 07/18/2022   HGB 15.2 07/18/2022   HCT 44.2 07/18/2022   MCV 90.2 07/18/2022   PLT 252 07/18/2022      Chemistry      Component Value Date/Time   NA 139 07/18/2022 1441   NA 139 10/27/2021 0857   K 4.1 07/18/2022 1441   CL 107 07/18/2022 1441   CO2 26 07/18/2022 1441   BUN 12 07/18/2022 1441   BUN 16 10/27/2021 0857   CREATININE 0.79 07/18/2022 1441   CREATININE 0.84 10/06/2019 0000      Component Value Date/Time   CALCIUM 9.5 07/18/2022 1441   ALKPHOS 65 07/18/2022 1441   AST 18 07/18/2022 1441   ALT 17 07/18/2022 1441   BILITOT 0.6 07/18/2022 1441       RADIOGRAPHIC STUDIES: I have personally reviewed the radiological images as listed and agreed with the findings in the report. No results found.   ASSESSMENT & PLAN:   .CLL (chronic lymphocytic leukemia) ([place]) # SLL/CLL-patient currently on acalabrutinib 100 mg twice a day [since start of May 2023]- tolerating well except mild-moderate fatigue/insominia. Continue acalbrutinib.  No F denies of any progression.  Labs within normal limits.  # Insomnia: ? Related Acalabrutinib. Recommend melatonin 2-3 mg/day.   # Continue  shingles/TLS prophylaxis: Acyclovir- [stop in July 2023; 6 months from [facility] January, 2023]- STABLE.  # Active smoker: recommend quitting smoking.   # IV access: PIV; mediport explanted.   # Vaccines: ok with shingrix # [first in April, 2023]; ok with covid/flu/RSV vaccines.   # DISPOSITION: # referral to CARE program re: fatigue # follow up in 6 weeks MD; labs- cbc/cmp;URIC ACID; LDH; Quantitative immunoglobulin-Dr.B               Orders Placed  This Encounter  Procedures   CBC with Differential/Platelet    Standing Status:   Future    Standing Expiration Date:   07/19/2023   Comprehensive metabolic panel    Standing Status:   Future    Standing Expiration Date:   07/19/2023   Uric acid    Standing Status:   Future    Standing Expiration Date:   07/19/2023   Lactate dehydrogenase    Standing Status:   Future    Standing Expiration Date:   07/19/2023   Immunoglobulins, QN, A/E/G/M    Standing Status:   Future    Standing Expiration Date:   07/19/2023   AMB REFERRAL TO [facility] CARE    Referral Priority:   Routine    Referral Type:   Consultation    Number of Visits Requested:   [place], MD 07/18/2022 4:17 PM

## 2022-07-19 ENCOUNTER — Other Ambulatory Visit (HOSPITAL_COMMUNITY): Payer: Self-pay

## 2022-07-25 ENCOUNTER — Other Ambulatory Visit: Payer: Self-pay

## 2022-08-01 ENCOUNTER — Encounter: Payer: Medicare PPO | Attending: Internal Medicine

## 2022-08-01 VITALS — Ht 65.5 in | Wt 171.2 lb

## 2022-08-01 DIAGNOSIS — C9110 Chronic lymphocytic leukemia of B-cell type not having achieved remission: Secondary | ICD-10-CM

## 2022-08-01 NOTE — Progress Notes (Signed)
CARE Daily Session Note  Patient Details  Name: Andres Mullins MRN: [id number] Date of Birth: 11-27-1951 Referring Provider:    Encounter Date: 08/01/2022  Check In:  Session Check In - 08/01/22 1512       Check-In   Supervising physician immediately available to respond to emergencies See telemetry face sheet for immediately available ER MD    Location [facility]    Staff Present Coralie Keens, MS, ASCM CEP, Exercise Physiologist;Jessica Davenport, MA, RCEP, CCRP, CCET;Mary Kellie Shropshire, RN, BSN, MA    Virtual Visit No    Medication changes reported     No    Fall or balance concerns reported    No    Tobacco Cessation No Change    Current number of cigarettes/nicotine per day     5    Warm-up and Cool-down Not performed (comment)   6MWT and Gym Orientation   Resistance Training Performed Yes    VAD Patient? No    PAD/SET Patient? No              6 Minute Walk     Row Name 08/01/22 1517         6 Minute Walk   Phase Initial     Distance 1585 feet     Walk Time 6 minutes     # of Rest Breaks 0     MPH 3     METS 3.5     RPE 12     Perceived Dyspnea  0     VO2 Peak 12.25     Symptoms No     Resting HR 91 bpm     Resting BP 122/62     Resting Oxygen Saturation  97 %     Exercise Oxygen Saturation  during 6 min walk 96 %     Max Ex. HR 114 bpm     Max Ex. BP 128/68     2 Minute Post BP 106/6                 Social History   Tobacco Use  Smoking Status Every Day   Packs/day: 0.50   Years: 45.00   Total pack years: 22.50   Types: Cigarettes  Smokeless Tobacco Never  Tobacco Comments   11/6- currently smoking 5 cigarettes a day    Pilar Plate is a current tobacco user. Intervention for tobacco cessation was provided at the initial medical review. He was asked about readiness to quit and reported he has no desire to quit at this time . Patient was advised and educated about tobacco cessation using combination therapy, tobacco cessation  classes, quit line, and quit smoking apps. Patient demonstrated understanding of this material. Staff will continue to provide encouragement and follow up with the patient throughout the program.    Goals Met:  Proper associated with RPD/PD & O2 Sat Exercise tolerated well Personal goals reviewed No report of concerns or symptoms today  Goals Unmet:  Not Applicable  Comments: First full day of orientation. All starting workloads were established based on the results of the 6 minute walk test done at initial orientation visit.  The plan for exercise progression was also introduced and progression will be customized based on patient's performance and goals.    Dr. Emily Filbert is Medical Director for [place].  Dr. Ottie Glazier is Medical Director for [facility] Pulmonary Rehabilitation.

## 2022-08-03 ENCOUNTER — Other Ambulatory Visit (HOSPITAL_COMMUNITY): Payer: Self-pay

## 2022-08-03 ENCOUNTER — Encounter: Payer: Medicare PPO | Admitting: *Deleted

## 2022-08-03 DIAGNOSIS — C9110 Chronic lymphocytic leukemia of B-cell type not having achieved remission: Secondary | ICD-10-CM

## 2022-08-03 NOTE — Progress Notes (Signed)
Daily Session Note  Patient Details  Name: Andres Mullins MRN: [id number] Date of Birth: 11-Feb-1951 Referring Provider:   April Manson Cancer Associated Rehabilitation & Exercise from 08/01/2022 in [facility]  Referring Provider Charlaine Dalton MD       Encounter Date: 08/03/2022  Check In:  Session Check In - 08/03/22 1239       Check-In   Supervising physician immediately available to respond to emergencies See telemetry face sheet for immediately available ER MD    Location [facility]    Staff Present Coralie Keens, MS, ASCM CEP, Exercise Physiologist;Nyzir Dubois Luan Pulling, MA, RCEP, CCRP, CCET    Virtual Visit No    Medication changes reported     No    Fall or balance concerns reported    No    Tobacco Cessation No Change    Warm-up and Cool-down Performed on first and last piece of equipment    Resistance Training Performed Yes    VAD Patient? No    PAD/SET Patient? No      Pain Assessment   Currently in Pain? No/denies                Social History   Tobacco Use  Smoking Status Every Day   Packs/day: 0.50   Years: 45.00   Total pack years: 22.50   Types: Cigarettes  Smokeless Tobacco Never  Tobacco Comments   11/6- currently smoking 5 cigarettes a day     Goals Met:  Proper associated with RPD/PD & O2 Sat Exercise tolerated well Personal goals reviewed No report of concerns or symptoms today Strength training completed today  Goals Unmet:  Not Applicable  Comments: First full day of exercise!  Patient was oriented to gym and equipment including functions, settings, policies, and procedures.  Patient's individual exercise prescription and treatment plan were reviewed.  All starting workloads were established based on the results of the 6 minute walk test done at initial orientation visit.  The plan for exercise progression was also introduced and progression will be customized based on patient's performance  and goals.    Dr. Emily Filbert is Medical Director for [place].  Dr. Ottie Glazier is Medical Director for [facility] Pulmonary Rehabilitation.

## 2022-08-07 ENCOUNTER — Other Ambulatory Visit (HOSPITAL_COMMUNITY): Payer: Self-pay

## 2022-08-08 ENCOUNTER — Encounter: Payer: Medicare PPO | Admitting: *Deleted

## 2022-08-08 DIAGNOSIS — C9110 Chronic lymphocytic leukemia of B-cell type not having achieved remission: Secondary | ICD-10-CM

## 2022-08-08 NOTE — Progress Notes (Signed)
Daily Session Note  Patient Details  Name: Andres Mullins MRN: [id number] Date of Birth: 03/02/1951 Referring Provider:   Flowsheet Row Cancer Associated Rehabilitation & Exercise from 08/01/2022 in [facility]  Referring Provider Brahmanday, Govinda MD       Encounter Date: 08/08/2022  Check In:  Session Check In - 08/08/22 1244       Check-In   Supervising physician immediately available to respond to emergencies See telemetry face sheet for immediately available ER MD    Location [facility]    Staff Present Kara Langdon, MS, ASCM CEP, Exercise Physiologist;Jessica Hawkins, MA, RCEP, CCRP, CCET    Virtual Visit No    Medication changes reported     No    Fall or balance concerns reported    No    Warm-up and Cool-down Performed on first and last piece of equipment    Resistance Training Performed Yes    VAD Patient? No    PAD/SET Patient? No      Pain Assessment   Currently in Pain? No/denies                Social History   Tobacco Use  Smoking Status Every Day   Packs/day: 0.50   Years: 45.00   Total pack years: 22.50   Types: Cigarettes  Smokeless Tobacco Never  Tobacco Comments   11/6- currently smoking 5 cigarettes a day     Goals Met:  Proper associated with RPD/PD & O2 Sat [place] with exercise equipment Exercise tolerated well No report of concerns or symptoms today Strength training completed today  Goals Unmet:  Not Applicable  Comments: Pt able to follow exercise prescription today without complaint.  Will continue to monitor for progression.    Dr. Mark Miller is Medical Director for [facility].  Dr. Fuad Aleskerov is Medical Director for [facility] Pulmonary Rehabilitation. 

## 2022-08-10 DIAGNOSIS — C9110 Chronic lymphocytic leukemia of B-cell type not having achieved remission: Secondary | ICD-10-CM

## 2022-08-10 NOTE — Progress Notes (Signed)
Daily Session Note  Patient Details  Name: Andres Mullins MRN: [id number] Date of Birth: 11-Apr-1951 Referring Provider:   April [facility]on [facility] & Exercise from 08/01/2022 in [facility] Cardiac and Pulmonary Rehab  Referring Provider Charlaine Dalton MD       Encounter Date: 08/10/2022  Check In:  Session Check In - 08/10/22 1347       Check-In   Supervising physician immediately available to respond to emergencies See telemetry face sheet for immediately available ER MD    Location [facility]-Cardiac & Pulmonary Rehab    Staff Present Antionette Fairy, BS, Exercise Physiologist;Susanne Bice, RN, BSN, CCRP    Virtual Visit No    Medication changes reported     No    Fall or balance concerns reported    No    Tobacco Cessation No Change    Current number of cigarettes/nicotine per day     5    Warm-up and Cool-down Performed on first and last piece of equipment    Resistance Training Performed Yes    VAD Patient? No    PAD/SET Patient? No      Pain Assessment   Currently in Pain? No/denies                Social History   Tobacco Use  Smoking Status Every Day   Packs/day: 0.50   Years: 45.00   Total pack years: 22.50   Types: Cigarettes  Smokeless Tobacco Never  Tobacco Comments   11/6- currently smoking 5 cigarettes a day     Goals Met:  Independence with exercise equipment Exercise tolerated well No report of concerns or symptoms today Strength training completed today  Goals Unmet:  Not Applicable  Comments: Pt able to follow exercise prescription today without complaint.  Will continue to monitor for progression.    Dr. Emily Filbert is Medical Director for [place].  Dr. Ottie Glazier is Medical Director for [facility] Pulmonary Rehabilitation.

## 2022-08-15 DIAGNOSIS — C9110 Chronic lymphocytic leukemia of B-cell type not having achieved remission: Secondary | ICD-10-CM

## 2022-08-15 NOTE — Progress Notes (Signed)
Daily Session Note  Patient Details  Name: Andres Mullins MRN: [id number] Date of Birth: July 11, 1951 Referring Provider:   April Manson Cancer Associated Rehabilitation & Exercise from 08/01/2022 in [facility] Cardiac and Pulmonary Rehab  Referring Provider Andres Dalton MD       Encounter Date: 08/15/2022  Check In:  Session Check In - 08/15/22 1232       Check-In   Supervising physician immediately available to respond to emergencies See telemetry face sheet for immediately available ER MD    Location [facility]-Cardiac & Pulmonary Rehab    Staff Present Antionette Fairy, BS, Exercise Physiologist;Jessica Crellin, MA, RCEP, CCRP, CCET    Virtual Visit No    Medication changes reported     No    Fall or balance concerns reported    No    Tobacco Cessation No Change    Current number of cigarettes/nicotine per day     5    Warm-up and Cool-down Performed on first and last piece of equipment    Resistance Training Performed Yes    VAD Patient? No    PAD/SET Patient? No      Pain Assessment   Currently in Pain? No/denies    Multiple Pain Sites No                Social History   Tobacco Use  Smoking Status Every Day   Packs/day: 0.50   Years: 45.00   Total pack years: 22.50   Types: Cigarettes  Smokeless Tobacco Never  Tobacco Comments   11/6- currently smoking 5 cigarettes a day     Goals Met:  Independence with exercise equipment Exercise tolerated well No report of concerns or symptoms today  Goals Unmet:  Not Applicable  Comments: Pt able to follow exercise prescription today without complaint.  Will continue to monitor for progression.    Dr. Emily Filbert is Medical Director for [name].  Dr. Ottie Glazier is Medical Director for [facility] Pulmonary Rehabilitation.

## 2022-08-17 DIAGNOSIS — C9110 Chronic lymphocytic leukemia of B-cell type not having achieved remission: Secondary | ICD-10-CM

## 2022-08-17 NOTE — Progress Notes (Signed)
Daily Session Note  Patient Details  Name: Andres Mullins MRN: [id number] Date of Birth: 12-25-1950 Referring Provider:   April Manson Cancer Associated Rehabilitation & Exercise from 08/01/2022 in [facility] Cardiac and Pulmonary Rehab  Referring Provider Charlaine Dalton MD       Encounter Date: 08/17/2022  Check In:  Session Check In - 08/17/22 1233       Check-In   Supervising physician immediately available to respond to emergencies See telemetry face sheet for immediately available ER MD    Location [facility]-Cardiac & Pulmonary Rehab    Staff Present Antionette Fairy, BS, Exercise Physiologist;Jessica [place], MA, RCEP, CCRP, Marylynn Pearson, MS, ASCM CEP, Exercise Physiologist    Virtual Visit No    Medication changes reported     No    Fall or balance concerns reported    No    Tobacco Cessation No Change    Current number of cigarettes/nicotine per day     5    Warm-up and Cool-down Performed on first and last piece of equipment    Resistance Training Performed Yes    VAD Patient? No    PAD/SET Patient? No      Pain Assessment   Currently in Pain? No/denies    Multiple Pain Sites No                Social History   Tobacco Use  Smoking Status Every Day   Packs/day: 0.50   Years: 45.00   Total pack years: 22.50   Types: Cigarettes  Smokeless Tobacco Never  Tobacco Comments   11/6- currently smoking 5 cigarettes a day     Goals Met:  Independence with exercise equipment Exercise tolerated well No report of concerns or symptoms today  Goals Unmet:  Not Applicable  Comments: Pt able to follow exercise prescription today without complaint.  Will continue to monitor for progression.    Dr. Emily Filbert is Medical Director for [place].  Dr. Ottie Glazier is Medical Director for [facility] Pulmonary Rehabilitation.

## 2022-08-21 ENCOUNTER — Other Ambulatory Visit (HOSPITAL_COMMUNITY): Payer: Self-pay

## 2022-08-22 ENCOUNTER — Other Ambulatory Visit (HOSPITAL_COMMUNITY): Payer: Self-pay

## 2022-08-24 DIAGNOSIS — C9110 Chronic lymphocytic leukemia of B-cell type not having achieved remission: Secondary | ICD-10-CM

## 2022-08-24 NOTE — Progress Notes (Signed)
Daily Session Note  Patient Details  Name: Andres Mullins MRN: [id number] Date of Birth: Jul 25, 1951 Referring Provider:   April Manson Cancer Associated Rehabilitation & Exercise from 08/01/2022 in [facility] Cardiac and Pulmonary Rehab  Referring Provider Charlaine Dalton MD       Encounter Date: 08/24/2022  Check In:  Session Check In - 08/24/22 1250       Check-In   Supervising physician immediately available to respond to emergencies See telemetry face sheet for immediately available ER MD    Location [facility]-Cardiac & Pulmonary Rehab    Staff Present Coralie Keens, MS, ASCM CEP, Exercise Physiologist;Jessica Luan [place], MA, RCEP, CCRP, CCET    Virtual Visit No    Medication changes reported     No    Fall or balance concerns reported    No    Tobacco Cessation No Change    Warm-up and Cool-down Performed on first and last piece of equipment    Resistance Training Performed Yes   yoga   VAD Patient? No    PAD/SET Patient? No                Social History   Tobacco Use  Smoking Status Every Day   Packs/day: 0.50   Years: 45.00   Total pack years: 22.50   Types: Cigarettes  Smokeless Tobacco Never  Tobacco Comments   11/6- currently smoking 5 cigarettes a day     Goals Met:  Proper associated with RPD/PD & O2 Sat Exercise tolerated well No report of concerns or symptoms today Strength training completed today  Goals Unmet:  Not Applicable  Comments: Pt able to follow exercise prescription today without complaint.  Will continue to monitor for progression.   Yoga completed today.   Dr. Emily Filbert is Medical Director for [place].  Dr. Ottie Glazier is Medical Director for [facility] Pulmonary Rehabilitation.

## 2022-08-29 ENCOUNTER — Encounter: Payer: Medicare PPO | Attending: Internal Medicine | Admitting: *Deleted

## 2022-08-29 DIAGNOSIS — C9110 Chronic lymphocytic leukemia of B-cell type not having achieved remission: Secondary | ICD-10-CM | POA: Insufficient documentation

## 2022-08-29 NOTE — Progress Notes (Signed)
Daily Session Note  Patient Details  Name: TRUST LEH MRN: [id number] Date of Birth: Nov 06, 1951 Referring Provider:   April Manson Cancer Associated Rehabilitation & Exercise from 08/01/2022 in [facility] Cardiac and Pulmonary Rehab  Referring Provider Charlaine Dalton MD       Encounter Date: 08/29/2022  Check In:  Session Check In - 08/29/22 1220       Check-In   Supervising physician immediately available to respond to emergencies See telemetry face sheet for immediately available ER MD    Location [facility]-Cardiac & Pulmonary Rehab    Staff Present Alberteen Sam, MA, RCEP, CCRP, Marylynn Pearson, MS, ASCM CEP, Exercise Physiologist;Noah Tickle, BS, Exercise Physiologist    Virtual Visit No    Medication changes reported     No    Fall or balance concerns reported    No    Tobacco Cessation No Change    Warm-up and Cool-down Performed on first and last piece of equipment    Resistance Training Performed Yes    VAD Patient? No    PAD/SET Patient? No      Pain Assessment   Currently in Pain? No/denies                Social History   Tobacco Use  Smoking Status Every Day   Packs/day: 0.50   Years: 45.00   Total pack years: 22.50   Types: Cigarettes  Smokeless Tobacco Never  Tobacco Comments   11/6- currently smoking 5 cigarettes a day     Goals Met:  Proper associated with RPD/PD & O2 Sat Independence with exercise equipment Exercise tolerated well No report of concerns or symptoms today Strength training completed today  Goals Unmet:  Not Applicable  Comments: Pt able to follow exercise prescription today without complaint.  Will continue to monitor for progression.    Dr. Emily Filbert is Medical Director for [place].  Dr. Ottie Glazier is Medical Director for [facility] Pulmonary Rehabilitation.

## 2022-08-31 DIAGNOSIS — C9110 Chronic lymphocytic leukemia of B-cell type not having achieved remission: Secondary | ICD-10-CM

## 2022-08-31 NOTE — Progress Notes (Signed)
Daily Session Note  Patient Details  Name: Andres Mullins MRN: [id number] Date of Birth: 22-Jun-1951 Referring Provider:   April Manson Cancer Associated Rehabilitation & Exercise from 08/01/2022 in [facility] Cardiac and Pulmonary Rehab  Referring Provider Charlaine Dalton MD       Encounter Date: 08/31/2022  Check In:  Session Check In - 08/31/22 1239       Check-In   Supervising physician immediately available to respond to emergencies See telemetry face sheet for immediately available ER MD    Location [facility]-Cardiac & Pulmonary Rehab    Staff Present Antionette Fairy, BS, Exercise Physiologist;Susanne Bice, RN, BSN, CCRP    Virtual Visit No    Medication changes reported     No    Fall or balance concerns reported    No    Tobacco Cessation No Change    Warm-up and Cool-down Performed on first and last piece of equipment    Resistance Training Performed Yes    VAD Patient? No    PAD/SET Patient? No      Pain Assessment   Currently in Pain? No/denies    Multiple Pain Sites No                Social History   Tobacco Use  Smoking Status Every Day   Packs/day: 0.50   Years: 45.00   Total pack years: 22.50   Types: Cigarettes  Smokeless Tobacco Never  Tobacco Comments   11/6- currently smoking 5 cigarettes a day     Goals Met:  Independence with exercise equipment Exercise tolerated well No report of concerns or symptoms today  Goals Unmet:  Not Applicable  Comments: Pt able to follow exercise prescription today without complaint.  Will continue to monitor for progression.    Dr. Emily Filbert is Medical Director for [place].  Dr. Ottie Glazier is Medical Director for [facility] Pulmonary Rehabilitation.

## 2022-09-01 ENCOUNTER — Inpatient Hospital Stay (HOSPITAL_BASED_OUTPATIENT_CLINIC_OR_DEPARTMENT_OTHER): Payer: Medicare PPO | Admitting: Internal Medicine

## 2022-09-01 ENCOUNTER — Inpatient Hospital Stay: Payer: Medicare PPO | Attending: Internal Medicine

## 2022-09-01 ENCOUNTER — Inpatient Hospital Stay: Payer: Medicare PPO

## 2022-09-01 ENCOUNTER — Encounter: Payer: Self-pay | Admitting: Internal Medicine

## 2022-09-01 ENCOUNTER — Inpatient Hospital Stay: Payer: Medicare PPO | Admitting: Internal Medicine

## 2022-09-01 DIAGNOSIS — Z79899 Other long term (current) drug therapy: Secondary | ICD-10-CM | POA: Diagnosis not present

## 2022-09-01 DIAGNOSIS — C9110 Chronic lymphocytic leukemia of B-cell type not having achieved remission: Secondary | ICD-10-CM | POA: Diagnosis not present

## 2022-09-01 DIAGNOSIS — I2510 Atherosclerotic heart disease of native coronary artery without angina pectoris: Secondary | ICD-10-CM | POA: Diagnosis not present

## 2022-09-01 DIAGNOSIS — J449 Chronic obstructive pulmonary disease, unspecified: Secondary | ICD-10-CM | POA: Insufficient documentation

## 2022-09-01 DIAGNOSIS — F17210 Nicotine dependence, cigarettes, uncomplicated: Secondary | ICD-10-CM | POA: Diagnosis not present

## 2022-09-01 DIAGNOSIS — M2550 Pain in unspecified joint: Secondary | ICD-10-CM | POA: Diagnosis not present

## 2022-09-01 DIAGNOSIS — Z79624 Long term (current) use of inhibitors of nucleotide synthesis: Secondary | ICD-10-CM | POA: Diagnosis not present

## 2022-09-01 DIAGNOSIS — R5383 Other fatigue: Secondary | ICD-10-CM | POA: Diagnosis not present

## 2022-09-01 DIAGNOSIS — E785 Hyperlipidemia, unspecified: Secondary | ICD-10-CM | POA: Diagnosis not present

## 2022-09-01 DIAGNOSIS — Z818 Family history of other mental and behavioral disorders: Secondary | ICD-10-CM | POA: Insufficient documentation

## 2022-09-01 DIAGNOSIS — K449 Diaphragmatic hernia without obstruction or gangrene: Secondary | ICD-10-CM | POA: Insufficient documentation

## 2022-09-01 DIAGNOSIS — M791 Myalgia, unspecified site: Secondary | ICD-10-CM | POA: Diagnosis not present

## 2022-09-01 DIAGNOSIS — Z8261 Family history of arthritis: Secondary | ICD-10-CM | POA: Insufficient documentation

## 2022-09-01 DIAGNOSIS — Z8249 Family history of ischemic heart disease and other diseases of the circulatory system: Secondary | ICD-10-CM | POA: Diagnosis not present

## 2022-09-01 DIAGNOSIS — Z9049 Acquired absence of other specified parts of digestive tract: Secondary | ICD-10-CM | POA: Diagnosis not present

## 2022-09-01 DIAGNOSIS — Z86711 Personal history of pulmonary embolism: Secondary | ICD-10-CM | POA: Insufficient documentation

## 2022-09-01 DIAGNOSIS — Z7952 Long term (current) use of systemic steroids: Secondary | ICD-10-CM | POA: Diagnosis not present

## 2022-09-01 LAB — COMPREHENSIVE METABOLIC PANEL
ALT: 16 U/L (ref 0–44)
AST: 17 U/L (ref 15–41)
Albumin: 3.9 g/dL (ref 3.5–5.0)
Alkaline Phosphatase: 73 U/L (ref 38–126)
Anion gap: 3 — ABNORMAL LOW (ref 5–15)
BUN: 10 mg/dL (ref 8–23)
CO2: 25 mmol/L (ref 22–32)
Calcium: 9.1 mg/dL (ref 8.9–10.3)
Chloride: 109 mmol/L (ref 98–111)
Creatinine, Ser: 0.97 mg/dL (ref 0.61–1.24)
GFR, Estimated: >60 mL/min (ref >60)
Glucose, Bld: 125 mg/dL — ABNORMAL HIGH (ref 70–99)
Potassium: 4.1 mmol/L (ref 3.5–5.1)
Sodium: 137 mmol/L (ref 135–145)
Total Bilirubin: 0.8 mg/dL (ref 0.3–1.2)
Total Protein: 6.6 g/dL (ref 6.5–8.1)

## 2022-09-01 LAB — CBC WITH DIFFERENTIAL/PLATELET
Abs Immature Granulocytes: 0.03 10*3/uL (ref 0.00–0.07)
Basophils Absolute: 0.1 10*3/uL (ref 0.0–0.1)
Basophils Relative: 1 %
Eosinophils Absolute: 0.2 10*3/uL (ref 0.0–0.5)
Eosinophils Relative: 2 %
HCT: 46.5 % (ref 39.0–52.0)
Hemoglobin: 16.1 g/dL (ref 13.0–17.0)
Immature Granulocytes: 0 %
Lymphocytes Relative: 17 %
Lymphs Abs: 1.3 10*3/uL (ref 0.7–4.0)
MCH: 31.5 pg (ref 26.0–34.0)
MCHC: 34.6 g/dL (ref 30.0–36.0)
MCV: 91 fL (ref 80.0–100.0)
Monocytes Absolute: 0.6 10*3/uL (ref 0.1–1.0)
Monocytes Relative: 8 %
Neutro Abs: 5.6 10*3/uL (ref 1.7–7.7)
Neutrophils Relative %: 72 %
Platelets: 260 10*3/uL (ref 150–400)
RBC: 5.11 MIL/uL (ref 4.22–5.81)
RDW: 13.3 % (ref 11.5–15.5)
WBC: 7.9 10*3/uL (ref 4.0–10.5)
nRBC: 0 % (ref 0.0–0.2)

## 2022-09-01 LAB — LACTATE DEHYDROGENASE: LDH: 122 U/L (ref 98–192)

## 2022-09-01 LAB — URIC ACID: Uric Acid, Serum: 5.5 mg/dL (ref 3.7–8.6)

## 2022-09-01 NOTE — Assessment & Plan Note (Addendum)
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 Continue acalbrutinib, but SKIPthe PM dose on weekend. Clinically, no evidence of any progression.  Labs within normal limits.  # Myalgias/Fatigue/ Insomnia: ? Related Acalabrutinib- Continue acalbrutinib, but SKIP the PM dose on weekend. Continue  melatonin 2-3 mg/day; continue Exercise/CARE program. Wants to with Tai-chi  # Borderline LOW BP- 92/62- drinks fluids; renal function ok; not on anti-HTN- monitor now.   # Active smoker: recommend quitting smoking.   # IV access: PIV; mediport explanted.   # Vaccines: ok with shingrix # [first in April, 2023];  S/p-flu/RSV/Pneumoanis vaccines; ok with covid.   # DISPOSITION: # follow up in 2 months MD; labs- cbc/cmp;URIC ACID; [facility]; Dr.B

## 2022-09-01 NOTE — Progress Notes (Signed)
Patient feeling fatigued.   BP 92/60, HR 96

## 2022-09-01 NOTE — Progress Notes (Signed)
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   AUG 2021-Head and neck: The brain demonstrates symmetric appearing diffuse physiologic uptake without focal finding to suggest abnormality. In the left posterior and medial maxillary cavity, there is an area of mucoid deposition seen within SUV max of 28.8. This was seen in the previous study of November. The specific bony erosion, clear-cut soft tissue lesion is not detected. This probably represents a chronic sinus disease focus. The bilateral optic nerves and extraocular muscles to the largest nodes are seen on axial image 98. 1 now demonstrates a short axis measurement of 3.3 cm and the other more medially, a short axis measurement of 2.5 cm. Demonstrate symmetric increased uptake. The left, much greater than right, jugulodigastric and supraclavicular nodes have increased significantly in size and avidity since the previous study. Both of these lymph nodes demonstrate an SUV max of 3.9. No largest lymph node in the right subclavicular space is seen on axial image 104, demonstrating an SUV max of 3.3, and a short axis measurement of 1.4 cm.   On axial image 102, a right  paratracheal lymph node is again identified with increased avidity, located immediately posterior to the right thyroid pole. This area was noted previously, but now increased avidity is seen, with an SUV max of 8.6 in this area. Multiple smaller, and less avid lymph nodes scattered throughout the left greater than right neck are seen, as well as supraclavicular and infraclavicular areas.   Thorax: Significant axillary lymph nodes have increased significantly in size. These are also left greater than right, with the largest on the left seen on image #114 with a short axis measurement of 2.2 cm and an SUV max of 4.3. The largest on the right as seen on image 111, demonstrating a short axis of 1.4 cm and an SUV max of 4.0. Too numerous to count smaller and similarly avid lymph nodes in both axillae are also noted. These are all larger than previously seen and in general, more avid.  Numerous small AP window and lesser mediastinal lymph nodes are seen with minimal increased avidity, appearing similar in size and avidity to the previous study. No other significant mediastinal adenopathy. No hilar adenopathy detected. These likely also represent lymphoproliferative disease.  Hyperinflation of COPD, bibasilar dependent atelectasis, some paucity of lung tissue and biapical scarring are noted without other focal pulmonary mass or nodule, or acute pulmonary parenchymal disease. Physiologic uptake in the left heart is seen, as expected. Small hiatal hernia is again identified.   Abdomen pelvis: Physiologic uptake in the liver, gut and urinary tract is again noted. No focal area of asymmetric uptake in the structures is seen. The spleen also demonstrates generalized increased uptake and stable splenomegaly, up to 14.2 cm. On image 198, a preaortic lymph node is detected of  increased size since the previous study, now measuring up to 1.2 cm short axis.  Several large lymph nodes on image 203 of the mesentery are noted.  The largest of these, on image 201 and ureters up to 1.8 cm short axis. Despite the fact that measurable avidity is not detected in these are the surrounding lymph nodes, these are doubtless involved in the lymphoproliferative disease described, and appear larger. Multiple smaller lymph nodes scattered throughout the mesentery are also seen.  Grossly enlarged right greater than left common iliac chain lymph nodes are again seen, throughout the pelvis and into the inguinal areas bilaterally. The most avid on the right is seen on image #260 to, demonstrates a short axis of 2.5 cm, and an SUV max of 4.9. The largest on the left is identified image #272, demonstrates a short axis of 2.0 cm, and an SUV max of 4.7. These are similar to the previously noted values.  Right greater than left inguinal adenopathy is seen, with the largest lymph node seen on the right image 276, demonstrating a short axis of 1.5 cm and an SUV max of 3.9. This is larger in size and avidity than previously seen. There are bilateral enlarged lymph nodes associated with the external iliac vessels on image 277. These measure 2.0 cm on the right in short axis, with an SUV max of 3.9.  On the left, image 278, the external iliac lymph node is larger at 1.8 cm short axis, but with an SUV max of only 3.8. Lesser avid and smaller lymph nodes on both sides are also detected.   Musculoskeletal: Scattered, insignificant uptake is detected in the muscles especially in the bilateral joints, without focal finding to suggest lymphoproliferative disease.   IMPRESSIONS:  Significant increase in numbers, short axis size, and to a large extent, avidity -in the lymph nodes of the left greater than right neck, supraclavicular and infraclavicular areas, bilateral axillary lymph nodes and common iliac chain, external iliac chain and inguinal lymph nodes, consistent with worsening lymphoproliferative disease. The mediastinal lymph nodes seen are still present and  unchanged in size or [significantly] in avidity.    Petal- Bx- SLL/CLL.July 2022-Discussed with Dr. Asquith-oncologist, [place]-reviewed the pathology of the lymph node biopsy in 2021-positive for SLL; negative for cyclin D1/1114 translocation.    # July 20th, 2022- GAZYVA #1 Aletha Halim plan ading VENATOCLAX after 2 cycles/de-bulking] s/p GAYZYVA [x6]- Feb 2023 - cycle #1  Venatoclax- x1 month- HELD sec to repeated hospitalization  # DISCONTINUED Marijo Sanes [sec to intolearnce]  # PE acute [NOV 6010] provoked--currently s/p  Eliquis x 6 months [end of  April 2023].    # MAY 1st, 2023- START ACALABRUTINIB 100 mg BID  # December 2022-pericarditis/effusion needing pericardiocentesis; FEB 2023-acute respiratory failure-hospitalization-? Etiology [s/p ID; Pul; Dr.A; cards- [facility]]  # Lung cancer screening- on [facility]  # SURVIVORSHIP:   # GENETICS:   DIAGNOSIS:   STAGE:         ;  GOALS:  CURRENT/MOST RECENT THERAPY :       CLL (chronic lymphocytic leukemia) ([place])  10/29/2019 Initial Diagnosis   CLL (chronic lymphocytic leukemia) ([place])   06/15/2021 -  Chemotherapy   Patient is on Treatment Plan : LYMPHOMA CLL/SLL Venetoclax + Obinutuzumab q28d        INTERVAL HISTORY: Ambulating independently.  Alone.   A pleasant 71 year-old male patient with CLL/SLL currently most currently on acalabrutinib is here for follow-up.  Complains re: fatigued/myalgias.  Intermittently at nighttime.  Otherwise denies  any lumps or bumps.  No recent hospitalization.  No fevers.  No chills.  Denies any wheezing.  Denies any worsening cough.   Review of Systems  Constitutional:  Positive for malaise/fatigue. Negative for chills, diaphoresis, fever and weight loss.  HENT:  Negative for nosebleeds and sore throat.   Eyes:  Negative for double vision.  Respiratory:  Negative for cough, hemoptysis, sputum production, shortness of breath and wheezing.   Cardiovascular:  Negative for chest pain, palpitations,  orthopnea and leg swelling.  Gastrointestinal:  Negative for abdominal pain, blood in stool, constipation, diarrhea, heartburn, melena, nausea and vomiting.  Genitourinary:  Negative for dysuria, frequency and urgency.  Musculoskeletal:  Positive for joint pain and myalgias. Negative for back pain.  Skin: Negative.  Negative for itching and rash.  Neurological:  Negative for dizziness, tingling, focal weakness, weakness and headaches.  Endo/Heme/Allergies:  Does not bruise/bleed easily.  Psychiatric/Behavioral:  Negative for depression. The patient is not nervous/anxious and does not have insomnia.      PAST MEDICAL HISTORY :  Past Medical History:  Diagnosis Date   Atherosclerosis of coronary artery 09/28/2016   Chest CT Nov 2017   Atrial tachycardia    CLL (chronic lymphocytic leukemia) (HCC)    Elevated C-reactive protein (CRP)    Elevated PSA    Fractured sternum 1999   Herpes    History of rib fracture 1999   multiple   Hyperlipidemia    Psoriasis    Pulmonary embolism ([place])    Rheumatoid factor positive 02/2014   Rosacea    Thoracic aortic atherosclerosis ([place]) 09/28/2016   Chest CT Nov 2017   Tobacco abuse    Vitamin D deficiency disease     PAST SURGICAL HISTORY :   Past Surgical History:  Procedure Laterality Date   APPENDECTOMY  1974   IR REMOVAL TUN ACCESS W/ PORT W/O FL MOD SED  03/20/2022   PERICARDIOCENTESIS N/A 10/03/2021   Procedure: PERICARDIOCENTESIS;  Surgeon: Jettie Booze, MD;  Location: [facility];  Service: Cardiovascular;  Laterality: N/A;   PORTA CATH INSERTION N/A 06/06/2021   Procedure: PORTA CATH INSERTION;  Surgeon: Algernon Huxley, MD;  Location: [facility];  Service: Cardiovascular;  Laterality: N/A;   SHOULDER ARTHROSCOPY  10/23/2016   Procedure: ARTHROSCOPY SHOULDER WITH DISTAL CLAVICLE EXCISION, PARTIAL ACROMIONECTOMY, AND DEBRIDEMENT;  Surgeon: Earnestine Leys, MD;  Location: [facility];  Service: Orthopedics;;   TIBIA  FRACTURE SURGERY Left 2004   titanium rod, ORIF    FAMILY HISTORY :   Family History  Problem Relation Age of Onset   Heart disease Father    Osteoarthritis Father    Alzheimer's disease Father    Cancer Neg Hx    Diabetes Neg Hx    Hypertension Neg Hx    Stroke Neg Hx    COPD Neg Hx     SOCIAL HISTORY:   Social History   Tobacco Use   Smoking status: Every Day    Packs/day: 0.50    Years: 45.00    Total pack years: 22.50    Types: Cigarettes   Smokeless tobacco: Never   Tobacco comments:    11/6- currently smoking 5 cigarettes a day   Vaping Use   Vaping Use: Never used  Substance Use Topics   Alcohol use: Yes    Alcohol/week: 3.0 standard drinks of alcohol    Types: 3 Glasses of wine per week    Comment: a couple glasses of wine a  week   Drug use: No    ALLERGIES:  is allergic to gluten meal.  MEDICATIONS:  Current Outpatient Medications  Medication Sig Dispense Refill   acalabrutinib maleate (CALQUENCE) 100 MG tablet Take 100 mg by mouth 2 (two) times daily. 60 tablet 2   indomethacin (INDOCIN) 25 MG capsule Take 1 capsule (25 mg total) by mouth 2 (two) times daily as needed. 60 capsule 0   oxyCODONE (OXY IR/ROXICODONE) 5 MG immediate release tablet Take 1 tablet (5 mg total) by mouth every 6 (six) hours as needed for moderate pain or severe pain. 30 tablet 0   Tapinarof (VTAMA) 1 % CREA Apply 1 Application topically daily. Apply to aa at lower legs for psoriasis 60 g 2   acyclovir (ZOVIRAX) 400 MG tablet TAKE 1 TABLET BY MOUTH TWICE A DAY 180 tablet 1   calcipotriene (DOVONOX) 0.005 % cream Apply 1 - 2 times daily to aa of lower legs for psoriasis 120 g 2   cholecalciferol (VITAMIN D3) 25 MCG (1000 UNIT) tablet Take 1,000 Units by mouth daily. (Patient not taking: Reported on 08/01/2022)     clobetasol cream (TEMOVATE) 0.05 % Apply to affected areas rash on legs once to twice daily until improved. Avoid face, groin, axilla. 60 g 2   hydrocortisone valerate cream  (WESTCORT) 0.2 % APPLY TO AFFECTED AREA TWICE A DAY 45 g 0   predniSONE (DELTASONE) 20 MG tablet Take 3 pills once a day x 1 week; do not stop until next visit. 60 tablet 0   No current facility-administered medications for this visit.    PHYSICAL EXAMINATION: ECOG PERFORMANCE STATUS: 0 - Asymptomatic  BP 92/60 (BP Location: Left Arm, Patient Position: Sitting)   Pulse 96   Temp (!) 97.1 F (36.2 C) (Tympanic)   Resp 18   Ht 5' 6"  (1.676 m)   Wt 170 lb 9.6 oz (77.4 kg)   SpO2 98%   BMI 27.54 kg/m   Filed Weights   09/01/22 0900  Weight: 170 lb 9.6 oz (77.4 kg)    No significant lymphadenopathy lymphadenopathy in the neck/underarm.  Physical Exam HENT:     Head: Normocephalic and atraumatic.     Mouth/Throat:     Pharynx: No oropharyngeal exudate.  Eyes:     Pupils: Pupils are equal, round, and reactive to light.  Cardiovascular:     Rate and Rhythm: Normal rate and regular rhythm.  Pulmonary:     Effort: No respiratory distress.     Breath sounds: No wheezing.  Abdominal:     General: Bowel sounds are normal. There is no distension.     Palpations: Abdomen is soft. There is no mass.     Tenderness: There is no abdominal tenderness. There is no guarding or rebound.  Musculoskeletal:        General: No tenderness. Normal range of motion.     Cervical back: Normal range of motion and neck supple.  Skin:    General: Skin is warm.  Neurological:     Mental Status: He is alert and oriented to person, place, and time.  Psychiatric:        Mood and Affect: Affect normal.     LABORATORY DATA:  I have reviewed the data as listed    Component Value Date/Time   NA 137 09/01/2022 0851   NA 139 10/27/2021 0857   K 4.1 09/01/2022 0851   CL 109 09/01/2022 0851   CO2 25 09/01/2022 0851   GLUCOSE 125 (  H) 09/01/2022 0851   BUN 10 09/01/2022 0851   BUN 16 10/27/2021 0857   CREATININE 0.97 09/01/2022 0851   CREATININE 0.84 10/06/2019 0000   CALCIUM 9.1 09/01/2022 0851    PROT 6.6 09/01/2022 0851   ALBUMIN 3.9 09/01/2022 0851   AST 17 09/01/2022 0851   ALT 16 09/01/2022 0851   ALKPHOS 73 09/01/2022 0851   BILITOT 0.8 09/01/2022 0851   GFRNONAA >60 09/01/2022 0851   GFRNONAA 91 10/06/2019 0000   GFRAA >60 12/26/2019 0842   GFRAA 106 10/06/2019 0000    No results found for: "SPEP", "UPEP"  Lab Results  Component Value Date   WBC 7.9 09/01/2022   NEUTROABS 5.6 09/01/2022   HGB 16.1 09/01/2022   HCT 46.5 09/01/2022   MCV 91.0 09/01/2022   PLT 260 09/01/2022      Chemistry      Component Value Date/Time   NA 137 09/01/2022 0851   NA 139 10/27/2021 0857   K 4.1 09/01/2022 0851   CL 109 09/01/2022 0851   CO2 25 09/01/2022 0851   BUN 10 09/01/2022 0851   BUN 16 10/27/2021 0857   CREATININE 0.97 09/01/2022 0851   CREATININE 0.84 10/06/2019 0000      Component Value Date/Time   CALCIUM 9.1 09/01/2022 0851   ALKPHOS 73 09/01/2022 0851   AST 17 09/01/2022 0851   ALT 16 09/01/2022 0851   BILITOT 0.8 09/01/2022 0851       RADIOGRAPHIC STUDIES: I have personally reviewed the radiological images as listed and agreed with the findings in the report. No results found.   ASSESSMENT & PLAN:   .CLL (chronic lymphocytic leukemia) ([place]) # SLL/CLL-patient currently on acalabrutinib 100 mg twice a day [since start of May 2023]- tolerating well except mild-moderate-severe fatigue/insominia [see below]. Continue acalbrutinib, but SKIPthe PM dose on weekend. Clinically, no evidence of any progression.  Labs within normal limits.  # Myalgias/Fatigue/ Insomnia: ? Related Acalabrutinib- Continue acalbrutinib, but SKIP the PM dose on weekend. Continue  melatonin 2-3 mg/day; continue Exercise/CARE program. Wants to with Tai-chi  # Borderline LOW BP- 92/62- drinks fluids; renal function ok; not on anti-HTN- monitor now.   # Active smoker: recommend quitting smoking.   # IV access: PIV; mediport explanted.   # Vaccines: ok with shingrix # [first in  April, 2023];  S/p-flu/RSV/Pneumoanis vaccines; ok with covid.   # DISPOSITION: # follow up in 2 months MD; labs- cbc/cmp;URIC ACID; L[facility]; Dr.B           Orders Placed This Encounter  Procedures   CBC with Differential/Platelet    Standing Status:   Future    Standing Expiration Date:   09/01/2023   Comprehensive metabolic panel    Standing Status:   Future    Standing Expiration Date:   09/01/2023   Uric acid    Standing Status:   Future    Standing Expiration Date:   09/02/2023   Lactate dehydrogenase    Standing Status:   Future    Standing Expiration Date:   09/02/2023            Cammie Sickle, MD 09/01/2022 10:00 AM

## 2022-09-03 LAB — IMMUNOGLOBULINS A/E/G/M, SERUM
IgA: 60 mg/dL — ABNORMAL LOW (ref 61–437)
IgE (Immunoglobulin E), Serum: 9 IU/mL (ref 6–495)
IgG (Immunoglobin G), Serum: 505 mg/dL — ABNORMAL LOW (ref 603–1613)
IgM (Immunoglobulin M), Srm: 13 mg/dL — ABNORMAL LOW (ref 20–172)

## 2022-09-05 ENCOUNTER — Encounter: Payer: Medicare PPO | Admitting: *Deleted

## 2022-09-05 DIAGNOSIS — C9110 Chronic lymphocytic leukemia of B-cell type not having achieved remission: Secondary | ICD-10-CM

## 2022-09-05 NOTE — Progress Notes (Signed)
Daily Session Note  Patient Details  Name: Andres Mullins MRN: [id number] Date of Birth: 07/18/1951 Referring Provider:   April Manson Cancer Associated Rehabilitation & Exercise from 08/01/2022 in [facility] Cardiac and Pulmonary Rehab  Referring Provider Charlaine Dalton MD       Encounter Date: 09/05/2022  Check In:  Session Check In - 09/05/22 1220       Check-In   Supervising physician immediately available to respond to emergencies See telemetry face sheet for immediately available ER MD    Location [facility]-Cardiac & Pulmonary Rehab    Staff Present Alberteen Sam, MA, RCEP, CCRP, Marylynn Pearson, MS, ASCM CEP, Exercise Physiologist    Virtual Visit No    Medication changes reported     No    Fall or balance concerns reported    No    Warm-up and Cool-down Performed on first and last piece of equipment    Resistance Training Performed Yes    VAD Patient? No    PAD/SET Patient? No      Pain Assessment   Currently in Pain? No/denies                Social History   Tobacco Use  Smoking Status Every Day   Packs/day: 0.50   Years: 45.00   Total pack years: 22.50   Types: Cigarettes  Smokeless Tobacco Never  Tobacco Comments   11/6- currently smoking 5 cigarettes a day     Goals Met:  Proper associated with RPD/PD & O2 Sat Independence with exercise equipment Exercise tolerated well No report of concerns or symptoms today Strength training completed today  Goals Unmet:  Not Applicable  Comments: Pt able to follow exercise prescription today without complaint.  Will continue to monitor for progression.    Dr. Emily Filbert is Medical Director for [facility].  Dr. Ottie Glazier is Medical Director for [facility] Pulmonary Rehabilitation.

## 2022-09-07 DIAGNOSIS — C9110 Chronic lymphocytic leukemia of B-cell type not having achieved remission: Secondary | ICD-10-CM

## 2022-09-07 NOTE — Progress Notes (Signed)
Daily Session Note  Patient Details  Name: Andres Mullins MRN: [id number] Date of Birth: 1951/01/22 Referring Provider:   April Manson Cancer Associated Rehabilitation & Exercise from 08/01/2022 in [facility] Cardiac and Pulmonary Rehab  Referring Provider Charlaine Dalton MD       Encounter Date: 09/07/2022  Check In:  Session Check In - 09/07/22 1233       Check-In   Supervising physician immediately available to respond to emergencies See telemetry face sheet for immediately available ER MD    Location [facility]    Staff Present Antionette Fairy, BS, Exercise Physiologist;Kara Eliezer Bottom, MS, ASCM CEP, Exercise Physiologist    Virtual Visit No    Medication changes reported     No    Fall or balance concerns reported    No    Tobacco Cessation No Change    Current number of cigarettes/nicotine per day     5    Warm-up and Cool-down Performed on first and last piece of equipment    Resistance Training Performed Yes    VAD Patient? No    PAD/SET Patient? No      Pain Assessment   Currently in Pain? No/denies    Multiple Pain Sites No                Social History   Tobacco Use  Smoking Status Every Day   Packs/day: 0.50   Years: 45.00   Total pack years: 22.50   Types: Cigarettes  Smokeless Tobacco Never  Tobacco Comments   11/6- currently smoking 5 cigarettes a day     Goals Met:  Independence with exercise equipment Exercise tolerated well No report of concerns or symptoms today  Goals Unmet:  Not Applicable  Comments: Pt able to follow exercise prescription today without complaint.  Will continue to monitor for progression.    Dr. Emily Filbert is Medical Director for [name].  Dr. Ottie Glazier is Medical Director for [name] Pulmonary Rehabilitation.

## 2022-09-11 ENCOUNTER — Other Ambulatory Visit (HOSPITAL_COMMUNITY): Payer: Self-pay

## 2022-09-11 ENCOUNTER — Other Ambulatory Visit: Payer: Self-pay | Admitting: Pharmacist

## 2022-09-11 DIAGNOSIS — C9110 Chronic lymphocytic leukemia of B-cell type not having achieved remission: Secondary | ICD-10-CM

## 2022-09-11 MED ORDER — CALQUENCE 100 MG PO TABS
100.0000 mg | ORAL_TABLET | Freq: Two times a day (BID) | ORAL | 2 refills | Status: DC
  Filled 2022-09-11: qty 60, 30d supply, fill #0
  Filled 2022-10-09: qty 60, 30d supply, fill #1
  Filled 2022-11-14: qty 60, 30d supply, fill #2

## 2022-09-12 DIAGNOSIS — C9110 Chronic lymphocytic leukemia of B-cell type not having achieved remission: Secondary | ICD-10-CM

## 2022-09-12 NOTE — Progress Notes (Signed)
Daily Session Note  Patient Details  Name: Andres Mullins MRN: [id number] Date of Birth: December 07, 1950 Referring Provider:   April Manson Cancer Associated Rehabilitation & Exercise from 08/01/2022 in [facility] Cardiac and Pulmonary Rehab  Referring Provider Charlaine Dalton MD       Encounter Date: 09/12/2022  Check In:  Session Check In - 09/12/22 1219       Check-In   Supervising physician immediately available to respond to emergencies See telemetry face sheet for immediately available ER MD    Location [facility] & Pulmonary Re[facility]    Staff Present Coralie Keens, MS, ASCM CEP, Exercise Physiologist;Kaden Daughdrill Luan Pulling, MA, RCEP, CCRP, CCET    Virtual Visit No    Medication changes reported     No    Fall or balance concerns reported    No    Tobacco Cessation No Change    Warm-up and Cool-down Performed on first and last piece of equipment    Resistance Training Performed Yes    VAD Patient? No    PAD/SET Patient? No      Pain Assessment   Currently in Pain? No/denies                Social History   Tobacco Use  Smoking Status Every Day   Packs/day: 0.50   Years: 45.00   Total pack years: 22.50   Types: Cigarettes  Smokeless Tobacco Never  Tobacco Comments   11/6- currently smoking 5 cigarettes a day     Goals Met:  Proper associated with RPD/PD & O2 Sat Independence with exercise equipment Exercise tolerated well No report of concerns or symptoms today Strength training completed today  Goals Unmet:  Not Applicable  Comments: Pt able to follow exercise prescription today without complaint.  Will continue to monitor for progression.    Dr. Emily Filbert is Medical Director for [place].  Dr. Ottie Glazier is Medical Director for [facility] Pulmonary Rehabilitation.

## 2022-09-14 DIAGNOSIS — C9110 Chronic lymphocytic leukemia of B-cell type not having achieved remission: Secondary | ICD-10-CM

## 2022-09-14 NOTE — Progress Notes (Signed)
Daily Session Note  Patient Details  Name: Andres Mullins MRN: [id number] Date of Birth: 06/13/1951 Referring Provider:   Flowsheet Row Cancer Associated Rehabilitation & Exercise from 08/01/2022 in [facility] Cardiac and Pulmonary Rehab  Referring Provider Brahmanday, Govinda MD       Encounter Date: 09/14/2022  Check In:  Session Check In - 09/14/22 1244       Check-In   Supervising physician immediately available to respond to emergencies See telemetry face sheet for immediately available ER MD    Location [facility] & Pulmonary Re[facility]    Staff Present Noah Tickle, BS, Exercise Physiologist;Kara Langdon, MS, ASCM CEP, Exercise Physiologist    Virtual Visit No    Medication changes reported     No    Fall or balance concerns reported    No    Tobacco Cessation No Change    Current number of cigarettes/nicotine per day     5    Warm-up and Cool-down Performed on first and last piece of equipment    Resistance Training Performed Yes    VAD Patient? No    PAD/SET Patient? No      Pain Assessment   Currently in Pain? No/denies    Multiple Pain Sites No                Social History   Tobacco Use  Smoking Status Every Day   Packs/day: 0.50   Years: 45.00   Total pack years: 22.50   Types: Cigarettes  Smokeless Tobacco Never  Tobacco Comments   11/6- currently smoking 5 cigarettes a day     Goals Met:  Independence with exercise equipment Exercise tolerated well No report of concerns or symptoms today  Goals Unmet:  Not Applicable  Comments: Pt able to follow exercise prescription today without complaint.  Will continue to monitor for progression.    Dr. Mark Miller is Medical Director for [facility].  Dr. Fuad Aleskerov is Medical Director for [facility] Pulmonary Rehabilitation. 

## 2022-09-18 ENCOUNTER — Other Ambulatory Visit (HOSPITAL_COMMUNITY): Payer: Self-pay

## 2022-09-18 ENCOUNTER — Ambulatory Visit (INDEPENDENT_AMBULATORY_CARE_PROVIDER_SITE_OTHER): Payer: Medicare PPO | Admitting: Dermatology

## 2022-09-18 DIAGNOSIS — D692 Other nonthrombocytopenic purpura: Secondary | ICD-10-CM

## 2022-09-18 DIAGNOSIS — L820 Inflamed seborrheic keratosis: Secondary | ICD-10-CM

## 2022-09-18 DIAGNOSIS — L409 Psoriasis, unspecified: Secondary | ICD-10-CM | POA: Diagnosis not present

## 2022-09-18 DIAGNOSIS — L821 Other seborrheic keratosis: Secondary | ICD-10-CM | POA: Diagnosis not present

## 2022-09-18 MED ORDER — ZORYVE 0.3 % EX CREA: 1.0000 | TOPICAL_CREAM | Freq: Every morning | CUTANEOUS | 3 refills | Status: DC

## 2022-09-18 MED ORDER — CLOBETASOL PROPIONATE 0.05 % EX CREA: TOPICAL_CREAM | CUTANEOUS | 1 refills | Status: DC

## 2022-09-18 NOTE — Patient Instructions (Addendum)
Start Zoryve cream in the morning to one leg  Cont Vtama cream in the morning to one leg (leg that he is not using Zoryve cream) Restart Clobetasol cream nightly to both legs until clear or up to one month Continue Coal Tar at night as needed for itching      Due to recent changes in healthcare laws, you may see results of your pathology and/or laboratory studies on MyChart before the doctors have had a chance to review them. We understand that in some cases there may be results that are confusing or concerning to you. Please understand that not all results are received at the same time and often the doctors may need to interpret multiple results in order to provide you with the best plan of care or course of treatment. Therefore, we ask that you please give [place] 2 business days to thoroughly review all your results before contacting the office for clarification. Should we see a critical lab result, you will be contacted sooner.   If You Need Anything After Your Visit  If you have any questions or concerns for your doctor, please call our main line at [phone] and press option 4 to reach your doctor's medical assistant. If no one answers, please leave a voicemail as directed and we will return your call as soon as possible. Messages left after 4 pm will be answered the following business day.   You may also send [place] a message via [place]. We typically respond to MyChart messages within 1-2 business days.  For prescription refills, please ask your pharmacy to contact our office. Our fax number is [phone].  If you have an urgent issue when the clinic is closed that cannot wait until the next business day, you can page your doctor at the number below.    Please note that while we do our best to be available for urgent issues outside of office hours, we are not available 24/7.   If you have an urgent issue and are unable to reach [place], you may choose to seek medical care at your doctor's  office, retail clinic, urgent care center, or emergency room.  If you have a medical emergency, please immediately call 911 or go to the emergency department.  Pager Numbers  - Dr. Nehemiah Massed: [phone]  - Dr. Laurence Ferrari: [phone]  - Dr. Nicole Kindred: [phone]  In the event of inclement weather, please call our main line at [phone] for an update on the status of any delays or closures.  Dermatology Medication Tips: Please keep the boxes that topical medications come in in order to help keep track of the instructions about where and how to use these. Pharmacies typically print the medication instructions only on the boxes and not directly on the medication tubes.   If your medication is too expensive, please contact our office at [phone] option 4 or send [place] a message through [place].   We are unable to tell what your co-pay for medications will be in advance as this is different depending on your insurance coverage. However, we may be able to find a substitute medication at lower cost or fill out paperwork to get insurance to cover a needed medication.   If a prior authorization is required to get your medication covered by your insurance company, please allow [place] 1-2 business days to complete this process.  Drug prices often vary depending on where the prescription is filled and some pharmacies may offer cheaper prices.  The website www.goodrx.com contains coupons  for medications through different pharmacies. The prices here do not account for what the cost may be with help from insurance (it may be cheaper with your insurance), but the website can give you the price if you did not use any insurance.  - You can print the associated coupon and take it with your prescription to the pharmacy.  - You may also stop by our office during regular business hours and pick up a GoodRx coupon card.  - If you need your prescription sent electronically to a different pharmacy, notify our office  through [facility] or by phone at [phone] option 4.     Si Usted Necesita Algo Despus de Su Visita  Tambin puede enviarnos un mensaje a travs de Pharmacist, community. Por lo general respondemos a los mensajes de MyChart en el transcurso de 1 a 2 das hbiles.  Para renovar recetas, por favor pida a su farmacia que se ponga en contacto con nuestra oficina. Harland Dingwall de fax es [place] [phone].  Si tiene un asunto urgente cuando la clnica est cerrada y que no puede esperar hasta el siguiente da hbil, puede llamar/localizar a su doctor(a) al nmero que aparece a continuacin.   Por favor, tenga en cuenta que aunque hacemos todo lo posible para estar disponibles para asuntos urgentes fuera del horario de [place], no estamos disponibles las 24 horas del da, los 7 das de la [place].   Si tiene un problema urgente y no puede comunicarse con nosotros, puede optar por buscar atencin mdica  en el consultorio de su doctor(a), en una clnica privada, en un centro de atencin urgente o en una sala de emergencias.  Si tiene Engineering geologist, por favor llame inmediatamente al 911 o vaya a la sala de emergencias.  Nmeros de bper  - Dr. Nehemiah Massed: [phone]  - Dra. Moye: [phone]  - Dra. Nicole Kindred: [phone]  En caso de inclemencias del [place], por favor llame a Johnsie Kindred principal al [phone] para una actualizacin sobre el [place] de cualquier retraso o cierre.  Consejos para la medicacin en dermatologa: Por favor, guarde las cajas en las que vienen los medicamentos de uso tpico para ayudarle a seguir las instrucciones sobre dnde y cmo usarlos. Las farmacias generalmente imprimen las instrucciones del medicamento slo en las cajas y no directamente en los tubos del [place].   Si su medicamento es muy caro, por favor, pngase en contacto con Zigmund Daniel llamando al [phone] y presione la opcin 4 o envenos un mensaje a travs de Pharmacist, community.   No  podemos decirle cul ser su copago por los medicamentos por adelantado ya que esto es diferente dependiendo de la cobertura de su seguro. Sin embargo, es posible que podamos encontrar un medicamento sustituto a Electrical engineer un formulario para que el seguro cubra el medicamento que se considera necesario.   Si se requiere una autorizacin previa para que su compaa de seguros [place] su medicamento, por favor permtanos de 1 a 2 das hbiles para completar este proceso.  Los precios de los medicamentos varan con frecuencia dependiendo del Environmental consultant de dnde se surte la receta y alguna farmacias pueden ofrecer precios ms baratos.  El sitio web www.goodrx.com tiene cupones para medicamentos de Airline pilot. Los precios aqu no tienen en cuenta lo que podra costar con la ayuda del seguro (puede ser ms barato con su seguro), pero el sitio web puede darle el precio si no utiliz Research scientist (physical sciences).  - Puede imprimir el cupn correspondiente  y llevarlo con su receta a la farmacia.  - Tambin puede pasar por nuestra oficina durante el horario de atencin regular y Charity fundraiser una tarjeta de cupones de GoodRx.  - Si necesita que su receta se enve electrnicamente a una farmacia diferente, informe a nuestra oficina a travs de MyChart de [place] o por telfono llamando al [phone] y presione la opcin 4.

## 2022-09-18 NOTE — Progress Notes (Signed)
Follow-Up Visit   Subjective  Andres Mullins is a 71 y.o. male who presents for the following: Psoriasis (Bil lower legs, Vtama cream prn, Psorisian qhs). He still is flared and has a lot of itching at night. He thinks the coal tar cream before bed helps the most with the itching.  Currently getting chemotherapy for CLL.   The following portions of the chart were reviewed this encounter and updated as appropriate:       Review of Systems:  No other skin or systemic complaints except as noted in HPI or Assessment and Plan.  Objective  Well appearing patient in no apparent distress; mood and affect are within normal limits.  A focused examination was performed including legs, back, arms. Relevant physical exam findings are noted in the Assessment and Plan.  bil lower legs, arms Well-demarcated scaly erythematous papules/plaques            back, L flank Multiple excoriated stuck on waxy papules with erythema    Assessment & Plan   Seborrheic Keratoses - Stuck-on, waxy, tan-brown papules and/or plaques  - Benign-appearing - Discussed benign etiology and prognosis. - Observe - Call for any changes - back  Purpura - Chronic; persistent and recurrent.  Treatable, but not curable. - Violaceous macules and patches - Benign - Related to trauma, age, sun damage and/or use of blood thinners, chronic use of topical and/or oral steroids - Observe - Can use OTC arnica containing moisturizer such as Dermend Bruise Formula if desired - Call for worsening or other concerns - arms  Psoriasis bil lower legs, arms  Chronic and persistent condition with duration or expected duration over one year. Condition is symptomatic/ bothersome to patient. Not currently at goal. Pt currently under treatment for CLL  Psoriasis is a chronic non-curable, but treatable genetic/hereditary disease that may have other systemic features affecting other organ systems such as joints (Psoriatic  Arthritis). It is associated with an increased risk of inflammatory bowel disease, heart disease, non-alcoholic fatty liver disease, and depression.     Start Zoryve cr qam to one leg samples x 2 Lot TPBY 10/2023 Cont Vtama cr qam to one leg (leg that he is not using Zoryve cream) Pt will see which one works best for him and then use that cream  Restart Clobetasol cr qhs to both legs Cont Coal Tar at night prn itch  Topical steroids (such as triamcinolone, fluocinolone, fluocinonide, mometasone, clobetasol, halobetasol, betamethasone, hydrocortisone) can cause thinning and lightening of the skin if they are used for too long in the same area. Your physician has selected the right strength medicine for your problem and area affected on the body. Please use your medication only as directed by your physician to prevent side effects.     Roflumilast (ZORYVE) 0.3 % CREA - bil lower legs, arms Apply 1 Application topically in the morning. Qam to legs for psoriasis  Related Medications Tapinarof (VTAMA) 1 % CREA Apply 1 Application topically daily. Apply to aa at lower legs for psoriasis  clobetasol cream (TEMOVATE) 0.05 % Apply to affected areas psoriasis on legs qhs until clear or up to 1 month. Avoid face, groin, axilla.  Inflamed seborrheic keratosis back, L flank  Start Zoryve cr qd to aa itchy spots back and left flank If not improved on f/up, will treat with cryotherapy   Return in about 1 month (around 10/19/2022) for Psoriasis f/u.  I, Othelia Pulling, RMA, am acting as scribe for Brendolyn Patty, MD .  Documentation: I have reviewed the above documentation for accuracy and completeness, and I agree with the above.  Brendolyn Patty MD

## 2022-09-19 ENCOUNTER — Encounter: Payer: Medicare PPO | Admitting: *Deleted

## 2022-09-19 ENCOUNTER — Other Ambulatory Visit (HOSPITAL_COMMUNITY): Payer: Self-pay

## 2022-09-19 DIAGNOSIS — C9110 Chronic lymphocytic leukemia of B-cell type not having achieved remission: Secondary | ICD-10-CM

## 2022-09-19 NOTE — Progress Notes (Signed)
Daily Session Note  Patient Details  Name: Andres Mullins MRN: [id number] Date of Birth: 1951/10/14 Referring Provider:   April Manson Cancer Associated Rehabilitation & Exercise from 08/01/2022 in [facility] Cardiac and Pulmonary Rehab  Referring Provider Charlaine Dalton MD       Encounter Date: 09/19/2022  Check In:  Session Check In - 09/19/22 1215       Check-In   Supervising physician immediately available to respond to emergencies See telemetry face sheet for immediately available ER MD    Location [facility]-Cardiac & Pulmonary Rehab    Staff Present Alberteen Sam, MA, RCEP, CCRP, Marylynn Pearson, MS, ASCM CEP, Exercise Physiologist    Virtual Visit No    Medication changes reported     No    Fall or balance concerns reported    No    Warm-up and Cool-down Performed on first and last piece of equipment    Resistance Training Performed Yes    VAD Patient? No    PAD/SET Patient? No      Pain Assessment   Currently in Pain? No/denies                Social History   Tobacco Use  Smoking Status Every Day   Packs/day: 0.50   Years: 45.00   Total pack years: 22.50   Types: Cigarettes  Smokeless Tobacco Never  Tobacco Comments   11/6- currently smoking 5 cigarettes a day     Goals Met:  Proper associated with RPD/PD & O2 Sat Independence with exercise equipment Exercise tolerated well No report of concerns or symptoms today Strength training completed today  Goals Unmet:  Not Applicable  Comments: Pt able to follow exercise prescription today without complaint.  Will continue to monitor for progression.    Dr. Emily Filbert is Medical Director for [place].  Dr. Ottie Glazier is Medical Director for [facility] Pulmonary Rehabilitation.

## 2022-09-21 DIAGNOSIS — C9110 Chronic lymphocytic leukemia of B-cell type not having achieved remission: Secondary | ICD-10-CM

## 2022-09-21 NOTE — Progress Notes (Signed)
Daily Session Note  Patient Details  Name: Andres Mullins MRN: [id number] Date of Birth: 08/10/1951 Referring Provider:   April Manson Cancer Associated Rehabilitation & Exercise from 08/01/2022 in [facility] Cardiac and Pulmonary Rehab  Referring Provider Charlaine Dalton MD       Encounter Date: 09/21/2022  Check In:  Session Check In - 09/21/22 1223       Check-In   Supervising physician immediately available to respond to emergencies See telemetry face sheet for immediately available ER MD    Location [facility]-Cardiac & Pulmonary Rehab    Staff Present Alberteen Sam, MA, RCEP, CCRP, Marylynn Pearson, MS, ASCM CEP, Exercise Physiologist    Virtual Visit No    Medication changes reported     No    Fall or balance concerns reported    No    Tobacco Cessation No Change    Current number of cigarettes/nicotine per day     5    Warm-up and Cool-down Not performed (comment)   Yoga   Resistance Training Performed No    VAD Patient? No    PAD/SET Patient? No      Pain Assessment   Currently in Pain? No/denies    Multiple Pain Sites No                Social History   Tobacco Use  Smoking Status Every Day   Packs/day: 0.50   Years: 45.00   Total pack years: 22.50   Types: Cigarettes  Smokeless Tobacco Never  Tobacco Comments   11/6- currently smoking 5 cigarettes a day     Goals Met:  Independence with exercise equipment Exercise tolerated well No report of concerns or symptoms today  Goals Unmet:  Not Applicable  Comments: Pt able to follow exercise prescription today without complaint.  Will continue to monitor for progression.  Yoga completed today.   Dr. Emily Filbert is Medical Director for [place].  Dr. Ottie Glazier is Medical Director for [facility] Pulmonary Rehabilitation.

## 2022-09-25 ENCOUNTER — Encounter (INDEPENDENT_AMBULATORY_CARE_PROVIDER_SITE_OTHER): Payer: Self-pay

## 2022-09-28 ENCOUNTER — Encounter: Payer: Medicare PPO | Attending: Internal Medicine

## 2022-09-28 DIAGNOSIS — C9110 Chronic lymphocytic leukemia of B-cell type not having achieved remission: Secondary | ICD-10-CM | POA: Insufficient documentation

## 2022-09-28 NOTE — Progress Notes (Signed)
Daily Session Note  Patient Details  Name: Andres Mullins MRN: [id number] Date of Birth: 1951/10/10 Referring Provider:   April Manson Cancer Associated Rehabilitation & Exercise from 08/01/2022 in [facility] Cardiac and Pulmonary Rehab  Referring Provider Charlaine Dalton MD       Encounter Date: 09/28/2022  Check In:  Session Check In - 09/28/22 1237       Check-In   Supervising physician immediately available to respond to emergencies See telemetry face sheet for immediately available ER MD    Location [facility]-Cardiac & Pulmonary Rehab    Staff Present Coralie Keens, MS, ASCM CEP, Exercise Physiologist;Roverto Bodmer, BS, Exercise Physiologist    Virtual Visit No    Medication changes reported     No    Fall or balance concerns reported    No    Tobacco Cessation No Change    Current number of cigarettes/nicotine per day     5    Warm-up and Cool-down Performed on first and last piece of equipment    Resistance Training Performed Yes    VAD Patient? No    PAD/SET Patient? No      Pain Assessment   Currently in Pain? No/denies    Multiple Pain Sites No                Social History   Tobacco Use  Smoking Status Every Day   Packs/day: 0.50   Years: 45.00   Total pack years: 22.50   Types: Cigarettes  Smokeless Tobacco Never  Tobacco Comments   11/6- currently smoking 5 cigarettes a day     Goals Met:  Independence with exercise equipment Exercise tolerated well No report of concerns or symptoms today  Goals Unmet:  Not Applicable  Comments: Pt able to follow exercise prescription today without complaint.  Will continue to monitor for progression.    Dr. Emily Filbert is Medical Director for [place].  Dr. Ottie Glazier is Medical Director for [facility] Pulmonary Rehabilitation.

## 2022-10-03 ENCOUNTER — Encounter: Payer: Medicare PPO | Admitting: *Deleted

## 2022-10-03 DIAGNOSIS — C9110 Chronic lymphocytic leukemia of B-cell type not having achieved remission: Secondary | ICD-10-CM

## 2022-10-03 NOTE — Progress Notes (Signed)
Daily Session Note  Patient Details  Name: DEANO TOMASZEWSKI MRN: [id number] Date of Birth: 12-22-1950 Referring Provider:   April Manson Cancer Associated Rehabilitation & Exercise from 08/01/2022 in [facility] Cardiac and Pulmonary Rehab  Referring Provider Charlaine Dalton MD       Encounter Date: 10/03/2022  Check In:  Session Check In - 10/03/22 1225       Check-In   Supervising physician immediately available to respond to emergencies See telemetry face sheet for immediately available ER MD    Location [facility]-Cardiac & Pulmonary Rehab    Staff Present Coralie Keens, MS, ASCM CEP, Exercise Physiologist;Noah Tickle, BS, Exercise Physiologist;Odelle Kosier Luan Pulling, MA, RCEP, CCRP, CCET    Virtual Visit No    Medication changes reported     No    Fall or balance concerns reported    No    Warm-up and Cool-down Performed on first and last piece of equipment    Resistance Training Performed Yes    VAD Patient? No    PAD/SET Patient? No      Pain Assessment   Currently in Pain? No/denies                Social History   Tobacco Use  Smoking Status Every Day   Packs/day: 0.50   Years: 45.00   Total pack years: 22.50   Types: Cigarettes  Smokeless Tobacco Never  Tobacco Comments   11/6- currently smoking 5 cigarettes a day     Goals Met:  Proper associated with RPD/PD & O2 Sat Independence with exercise equipment Exercise tolerated well No report of concerns or symptoms today Strength training completed today  Goals Unmet:  Not Applicable  Comments: Pt able to follow exercise prescription today without complaint.  Will continue to monitor for progression.    Dr. Emily Filbert is Medical Director for [place].  Dr. Ottie Glazier is Medical Director for [facility] Pulmonary Rehabilitation.

## 2022-10-04 ENCOUNTER — Other Ambulatory Visit (HOSPITAL_COMMUNITY): Payer: Self-pay

## 2022-10-05 DIAGNOSIS — C9110 Chronic lymphocytic leukemia of B-cell type not having achieved remission: Secondary | ICD-10-CM

## 2022-10-05 NOTE — Progress Notes (Signed)
Daily Session Note  Patient Details  Name: Andres Mullins MRN: [id number] Date of Birth: [birth year] Referring Provider:   April Manson Cancer Associated Rehabilitation & Exercise from 08/01/2022 in [facility] Cardiac and Pulmonary Rehab  Referring Provider Charlaine Dalton MD       Encounter Date: 10/05/2022  Check In:  Session Check In - 10/05/22 1258       Check-In   Supervising physician immediately available to respond to emergencies See telemetry face sheet for immediately available ER MD    Location [facility]-Cardiac & Pulmonary Rehab    Staff Present Coralie Keens, MS, ASCM CEP, Exercise Physiologist;Christia Coaxum, BS, Exercise Physiologist    Virtual Visit No    Medication changes reported     No    Fall or balance concerns reported    No    Tobacco Cessation No Change    Current number of cigarettes/nicotine per day     5    Warm-up and Cool-down Performed on first and last piece of equipment    Resistance Training Performed Yes    VAD Patient? No    PAD/SET Patient? No      Pain Assessment   Currently in Pain? No/denies    Multiple Pain Sites No                Social History   Tobacco Use  Smoking Status Every Day   Packs/day: 0.50   Years: 45.00   Total pack years: 22.50   Types: Cigarettes  Smokeless Tobacco Never  Tobacco Comments   11/6- currently smoking 5 cigarettes a day     Goals Met:  Independence with exercise equipment Exercise tolerated well No report of concerns or symptoms today  Goals Unmet:  Not Applicable  Comments: Pt able to follow exercise prescription today without complaint.  Will continue to monitor for progression.    Dr. Emily Filbert is Medical Director for [place].  Dr. Ottie Glazier is Medical Director for [facility] Pulmonary Rehabilitation.

## 2022-10-09 ENCOUNTER — Other Ambulatory Visit (HOSPITAL_COMMUNITY): Payer: Self-pay

## 2022-10-10 ENCOUNTER — Encounter: Payer: Medicare PPO | Admitting: *Deleted

## 2022-10-10 DIAGNOSIS — C9110 Chronic lymphocytic leukemia of B-cell type not having achieved remission: Secondary | ICD-10-CM

## 2022-10-10 NOTE — Progress Notes (Signed)
Daily Session Note  Patient Details  Name: TYMARION EVERARD MRN: [id number] Date of Birth: [birth year] Referring Provider:   April Manson Cancer Associated Rehabilitation & Exercise from 08/01/2022 in [facility] Cardiac and Pulmonary Rehab  Referring Provider Charlaine Dalton MD       Encounter Date: 10/10/2022  Check In:  Session Check In - 10/10/22 1226       Check-In   Supervising physician immediately available to respond to emergencies See telemetry face sheet for immediately available ER MD    Location [facility]-Cardiac & Pulmonary Rehab    Staff Present Coralie Keens, MS, ASCM CEP, Exercise Physiologist;Abigale Dorow Luan Pulling, MA, RCEP, CCRP, CCET    Virtual Visit No    Medication changes reported     No    Fall or balance concerns reported    No    Tobacco Cessation No Change    Warm-up and Cool-down Performed on first and last piece of equipment    Resistance Training Performed Yes    VAD Patient? No    PAD/SET Patient? No      Pain Assessment   Currently in Pain? No/denies                Social History   Tobacco Use  Smoking Status Every Day   Packs/day: 0.50   Years: 45.00   Total pack years: 22.50   Types: Cigarettes  Smokeless Tobacco Never  Tobacco Comments   11/6- currently smoking 5 cigarettes a day     Goals Met:  Proper associated with RPD/PD & O2 Sat Independence with exercise equipment Exercise tolerated well No report of concerns or symptoms today Strength training completed today  Goals Unmet:  Not Applicable  Comments: Pt able to follow exercise prescription today without complaint.  Will continue to monitor for progression.    Dr. Emily Filbert is Medical Director for [place].  Dr. Ottie Glazier is Medical Director for [facility] Pulmonary Rehabilitation.

## 2022-10-12 ENCOUNTER — Encounter: Payer: Medicare PPO | Admitting: *Deleted

## 2022-10-12 VITALS — Ht 65.5 in | Wt 170.0 lb

## 2022-10-12 DIAGNOSIS — C9110 Chronic lymphocytic leukemia of B-cell type not having achieved remission: Secondary | ICD-10-CM

## 2022-10-12 NOTE — Progress Notes (Signed)
Daily Session Note  Patient Details  Name: Andres Mullins MRN: [id number] Date of Birth: 06-13-1951 Referring Provider:   April Manson Cancer Associated Rehabilitation & Exercise from 08/01/2022 in [facility] Cardiac and Pulmonary Rehab  Referring Provider Charlaine Dalton MD       Encounter Date: 10/12/2022  Check In:  Session Check In - 10/12/22 1229       Check-In   Supervising physician immediately available to respond to emergencies See telemetry face sheet for immediately available ER MD    Location [facility]-Cardiac & Pulmonary Rehab    Staff Present Coralie Keens, MS, ASCM CEP, Exercise Physiologist;Maddy Graham Luan Pulling, MA, RCEP, CCRP, CCET;Laureen Brown, BS, RRT, CPFT    Virtual Visit No    Medication changes reported     No    Fall or balance concerns reported    No    Warm-up and Cool-down Performed on first and last piece of equipment    Resistance Training Performed Yes    VAD Patient? No    PAD/SET Patient? No      Pain Assessment   Currently in Pain? No/denies                Social History   Tobacco Use  Smoking Status Every Day   Packs/day: 0.50   Years: 45.00   Total pack years: 22.50   Types: Cigarettes  Smokeless Tobacco Never  Tobacco Comments   11/6- currently smoking 5 cigarettes a day     Goals Met:  Proper associated with RPD/PD & O2 Sat Independence with exercise equipment Exercise tolerated well No report of concerns or symptoms today Strength training completed today  Goals Unmet:  Not Applicable  Comments: Pt able to follow exercise prescription today without complaint.  Will continue to monitor for progression.    Dr. Emily Filbert is Medical Director for [place].  Dr. Ottie Glazier is Medical Director for [facility] Pulmonary Rehabilitation.

## 2022-10-17 ENCOUNTER — Encounter: Payer: Medicare PPO | Admitting: *Deleted

## 2022-10-17 ENCOUNTER — Ambulatory Visit (INDEPENDENT_AMBULATORY_CARE_PROVIDER_SITE_OTHER): Payer: Medicare PPO | Admitting: Dermatology

## 2022-10-17 DIAGNOSIS — L820 Inflamed seborrheic keratosis: Secondary | ICD-10-CM

## 2022-10-17 DIAGNOSIS — L409 Psoriasis, unspecified: Secondary | ICD-10-CM | POA: Diagnosis not present

## 2022-10-17 DIAGNOSIS — C9110 Chronic lymphocytic leukemia of B-cell type not having achieved remission: Secondary | ICD-10-CM

## 2022-10-17 NOTE — Progress Notes (Signed)
Daily Session Note  Patient Details  Name: Andres Mullins MRN: [id number] Date of Birth: 07/01/1951 Referring Provider:   Flowsheet Row Cancer Associated Rehabilitation & Exercise from 08/01/2022 in [facility]  Referring Provider Brahmanday, Govinda MD       Encounter Date: 10/17/2022  Check In:  Session Check In - 10/17/22 1231       Check-In   Supervising physician immediately available to respond to emergencies See telemetry face sheet for immediately available ER MD    Location [facility]    Staff Present Kara Langdon, MS, ASCM CEP, Exercise Physiologist;Jessica Hawkins, MA, RCEP, CCRP, CCET    Virtual Visit No    Medication changes reported     No    Fall or balance concerns reported    No    Warm-up and Cool-down Performed on first and last piece of equipment    Resistance Training Performed Yes    VAD Patient? No    PAD/SET Patient? No      Pain Assessment   Currently in Pain? No/denies                Social History   Tobacco Use  Smoking Status Every Day   Packs/day: 0.50   Years: 45.00   Total pack years: 22.50   Types: Cigarettes  Smokeless Tobacco Never  Tobacco Comments   11/6- currently smoking 5 cigarettes a day     Goals Met:  Proper associated with RPD/PD & O2 Sat Independence with exercise equipment Exercise tolerated well No report of concerns or symptoms today Strength training completed today  Goals Unmet:  Not Applicable  Comments: Pt able to follow exercise prescription today without complaint.  Will continue to monitor for progression.    Dr. Mark Miller is Medical Director for [facility].  Dr. Fuad Aleskerov is Medical Director for [facility]. 

## 2022-10-17 NOTE — Patient Instructions (Addendum)
Continue Zoryve Cream to legs once a day. Continue Clobetasol Cream once to twice a day for flares. Avoid face, groin, underarms.  Topical steroids (such as triamcinolone, fluocinolone, fluocinonide, mometasone, clobetasol, halobetasol, betamethasone, hydrocortisone) can cause thinning and lightening of the skin if they are used for too long in the same area. Your physician has selected the right strength medicine for your problem and area affected on the body. Please use your medication only as directed by your physician to prevent side effects.   Discussed Otezla tablets for possible psoriatic arthritis.   Cryotherapy Aftercare  Wash gently with soap and water everyday.   Apply Vaseline and Band-Aid daily until healed.   Due to recent changes in healthcare laws, you may see results of your pathology and/or laboratory studies on MyChart before the doctors have had a chance to review them. We understand that in some cases there may be results that are confusing or concerning to you. Please understand that not all results are received at the same time and often the doctors may need to interpret multiple results in order to provide you with the best plan of care or course of treatment. Therefore, we ask that you please give [place] 2 business days to thoroughly review all your results before contacting the office for clarification. Should we see a critical lab result, you will be contacted sooner.   If You Need Anything After Your Visit  If you have any questions or concerns for your doctor, please call our main line at [phone] and press option 4 to reach your doctor's medical assistant. If no one answers, please leave a voicemail as directed and we will return your call as soon as possible. Messages left after 4 pm will be answered the following business day.   You may also send [place] a message via [place]. We typically respond to MyChart messages within 1-2 business days.  For prescription refills,  please ask your pharmacy to contact our office. Our fax number is [phone].  If you have an urgent issue when the clinic is closed that cannot wait until the next business day, you can page your doctor at the number below.    Please note that while we do our best to be available for urgent issues outside of office hours, we are not available 24/7.   If you have an urgent issue and are unable to reach [place], you may choose to seek medical care at your doctor's office, retail clinic, urgent care center, or emergency room.  If you have a medical emergency, please immediately call 911 or go to the emergency department.  Pager Numbers  - Dr. Nehemiah Massed: [phone]  - Dr. Laurence Ferrari: [phone]  - Dr. Nicole Kindred: [phone]  In the event of inclement weather, please call our main line at [phone] for an update on the status of any delays or closures.  Dermatology Medication Tips: Please keep the boxes that topical medications come in in order to help keep track of the instructions about where and how to use these. Pharmacies typically print the medication instructions only on the boxes and not directly on the medication tubes.   If your medication is too expensive, please contact our office at [phone] option 4 or send [place] a message through [place].   We are unable to tell what your co-pay for medications will be in advance as this is different depending on your insurance coverage. However, we may be able to find a substitute medication at lower cost or fill  out paperwork to get insurance to cover a needed medication.   If a prior authorization is required to get your medication covered by your insurance company, please allow [place] 1-2 business days to complete this process.  Drug prices often vary depending on where the prescription is filled and some pharmacies may offer cheaper prices.  The website www.goodrx.com contains coupons for medications through different pharmacies. The prices  here do not account for what the cost may be with help from insurance (it may be cheaper with your insurance), but the website can give you the price if you did not use any insurance.  - You can print the associated coupon and take it with your prescription to the pharmacy.  - You may also stop by our office during regular business hours and pick up a GoodRx coupon card.  - If you need your prescription sent electronically to a different pharmacy, notify our office through [facility] or by phone at [phone] option 4.     Si Usted Necesita Algo Despus de Su Visita  Tambin puede enviarnos un mensaje a travs de Pharmacist, community. Por lo general respondemos a los mensajes de MyChart en el transcurso de 1 a 2 das hbiles.  Para renovar recetas, por favor pida a su farmacia que se ponga en contacto con nuestra oficina. Harland Dingwall de fax es [place] [phone].  Si tiene un asunto urgente cuando la clnica est cerrada y que no puede esperar hasta el siguiente da hbil, puede llamar/localizar a su doctor(a) al nmero que aparece a continuacin.   Por favor, tenga en cuenta que aunque hacemos todo lo posible para estar disponibles para asuntos urgentes fuera del horario de [place], no estamos disponibles las 24 horas del da, los 7 das de la [place].   Si tiene un problema urgente y no puede comunicarse con nosotros, puede optar por buscar atencin mdica  en el consultorio de su doctor(a), en una clnica privada, en un centro de atencin urgente o en una sala de emergencias.  Si tiene Engineering geologist, por favor llame inmediatamente al 911 o vaya a la sala de emergencias.  Nmeros de bper  - Dr. Nehemiah Massed: [phone]  - Dra. Moye: [phone]  - Dra. Nicole Kindred: [phone]  En caso de inclemencias del [place], por favor llame a Johnsie Kindred principal al [phone] para una actualizacin sobre el [place] de cualquier retraso o cierre.  Consejos para la medicacin en  dermatologa: Por favor, guarde las cajas en las que vienen los medicamentos de uso tpico para ayudarle a seguir las instrucciones sobre dnde y cmo usarlos. Las farmacias generalmente imprimen las instrucciones del medicamento slo en las cajas y no directamente en los tubos del [place].   Si su medicamento es muy caro, por favor, pngase en contacto con Zigmund Daniel llamando al [phone] y presione la opcin 4 o envenos un mensaje a travs de Pharmacist, community.   No podemos decirle cul ser su copago por los medicamentos por adelantado ya que esto es diferente dependiendo de la cobertura de su seguro. Sin embargo, es posible que podamos encontrar un medicamento sustituto a Electrical engineer un formulario para que el seguro cubra el medicamento que se considera necesario.   Si se requiere una autorizacin previa para que su compaa de seguros [place] su medicamento, por favor permtanos de 1 a 2 das hbiles para completar este proceso.  Los precios de los medicamentos varan con frecuencia dependiendo del Environmental consultant de dnde se surte la receta y  alguna farmacias pueden ofrecer precios ms baratos.  El sitio web www.goodrx.com tiene cupones para medicamentos de Airline pilot. Los precios aqu no tienen en cuenta lo que podra costar con la ayuda del seguro (puede ser ms barato con su seguro), pero el sitio web puede darle el precio si no utiliz Research scientist (physical sciences).  - Puede imprimir el cupn correspondiente y llevarlo con su receta a la farmacia.  - Tambin puede pasar por nuestra oficina durante el horario de atencin regular y Charity fundraiser una tarjeta de cupones de GoodRx.  - Si necesita que su receta se enve electrnicamente a una farmacia diferente, informe a nuestra oficina a travs de MyChart de [place] o por telfono llamando al [phone] y presione la opcin 4.

## 2022-10-17 NOTE — Progress Notes (Signed)
Follow-Up Visit   Subjective  Andres Mullins is a 71 y.o. male who presents for the following: Follow-up.  Patient presents for 1 month follow-up psoriasis. He has been using Zoryve Cream on the left leg and Vtama Cream on the right leg every morning and Clobetasol cream to both legs every night. He feels like Zoryve Cream may be helping a little more. He has some joint pain/stiffness to hands in the mornings which improve throughout the day and worsen at night. He also has some irritated spots on his back/flank to check. Currently getting treatment for CLL.  The following portions of the chart were reviewed this encounter and updated as appropriate:       Review of Systems:  No other skin or systemic complaints except as noted in HPI or Assessment and Plan.  Objective  Well appearing patient in no apparent distress; mood and affect are within normal limits.  A focused examination was performed including face, trunk, legs. Relevant physical exam findings are noted in the Assessment and Plan.  legs Pink scaly patches R > L; pink scaly papules on the thighs.         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  Chronic and persistent condition with duration or expected duration over one year. Condition is symptomatic/ bothersome to patient. Not currently at goal, but improving compared to previous photos. Pt currently under treatment for CLL. Discussed Otezla and side effects. Patient will discuss with oncologist. Discussed patient seeing rheumatologist.  New photos taken today.   Psoriasis is a chronic non-curable, but treatable genetic/hereditary disease that may have other systemic features affecting other organ systems such as joints (Psoriatic Arthritis). It is associated with an increased risk of inflammatory bowel disease, heart disease, non-alcoholic fatty liver disease, and  depression.    Continue Zoryve Cream QAM to both legs Continue Clobetasol Cream QHS to both legs PRN flares. Topical steroids (such as triamcinolone, fluocinolone, fluocinonide, mometasone, clobetasol, halobetasol, betamethasone, hydrocortisone) can cause thinning and lightening of the skin if they are used for too long in the same area. Your physician has selected the right strength medicine for your problem and area affected on the body. Please use your medication only as directed by your physician to prevent side effects.    Consider Xtrac treatments in January 2024.   Related Medications Tapinarof (VTAMA) 1 % CREA Apply 1 Application topically daily. Apply to aa at lower legs for psoriasis  Roflumilast (ZORYVE) 0.3 % CREA Apply 1 Application topically in the morning. Qam to legs for psoriasis  clobetasol cream (TEMOVATE) 0.05 % Apply to affected areas psoriasis on legs qhs until clear or up to 1 month. Avoid face, groin, axilla.  Inflamed seborrheic keratosis R upper flank x 1  Symptomatic, irritating, patient would like treated.  Destruction of lesion - R upper flank x 1  Destruction method: cryotherapy   Informed consent: discussed and consent obtained   Lesion destroyed using liquid nitrogen: Yes   Region frozen until ice ball extended beyond lesion: Yes   Outcome: patient tolerated procedure well with no complications   Post-procedure details: wound care instructions given   Additional details:  Prior to procedure, discussed risks of blister formation, small wound, skin dyspigmentation, or rare scar following cryotherapy. Recommend Vaseline ointment to treated areas while healing.    Return in about 6 months (around 04/17/2023) for Psoriasis.  IJamesetta Orleans, CMA, am acting as scribe for Brendolyn Patty, MD .  Documentation: I have reviewed the above documentation for accuracy and completeness, and I agree with the above.  Brendolyn Patty MD

## 2022-10-18 ENCOUNTER — Other Ambulatory Visit: Payer: Self-pay

## 2022-10-23 ENCOUNTER — Other Ambulatory Visit (HOSPITAL_COMMUNITY): Payer: Self-pay

## 2022-10-24 DIAGNOSIS — C9110 Chronic lymphocytic leukemia of B-cell type not having achieved remission: Secondary | ICD-10-CM

## 2022-10-24 NOTE — Progress Notes (Signed)
Daily Session Note  Patient Details  Name: Andres Mullins MRN: [id number] Date of Birth: 21-Jun-1951 Referring Provider:   April Manson Cancer Associated Rehabilitation & Exercise from 08/01/2022 in [facility]  Referring Provider Charlaine Dalton MD       Encounter Date: 10/24/2022  Check In:  Session Check In - 10/24/22 1225       Check-In   Supervising physician immediately available to respond to emergencies See telemetry face sheet for immediately available ER MD    Location [facility]    Staff Present Andres Mullins, BS, Exercise Physiologist;Andres [place], MA, RCEP, CCRP, CCET    Virtual Visit No    Medication changes reported     No    Fall or balance concerns reported    No    Tobacco Cessation No Change    Current number of cigarettes/nicotine per day     5    Warm-up and Cool-down Performed on first and last piece of equipment    Resistance Training Performed Yes    VAD Patient? No    PAD/SET Patient? No      Pain Assessment   Currently in Pain? No/denies    Multiple Pain Sites No                Social History   Tobacco Use  Smoking Status Every Day   Packs/day: 0.50   Years: 45.00   Total pack years: 22.50   Types: Cigarettes  Smokeless Tobacco Never  Tobacco Comments   11/6- currently smoking 5 cigarettes a day     Goals Met:  Independence with exercise equipment Exercise tolerated well No report of concerns or symptoms today  Goals Unmet:  Not Applicable  Comments: Pt able to follow exercise prescription today without complaint.  Will continue to monitor for progression.    Dr. Emily Mullins is Medical Director for [facility].  Dr. Ottie Mullins is Medical Director for [facility].

## 2022-10-26 DIAGNOSIS — C9110 Chronic lymphocytic leukemia of B-cell type not having achieved remission: Secondary | ICD-10-CM

## 2022-10-26 NOTE — Patient Instructions (Addendum)
Discharge Patient Instructions  Patient Details  Name: Andres Mullins MRN: [id number] Date of Birth: 11/15/1951 Referring Provider:     Number of Visits: 24  Reason for Discharge:  Patient reached a stable level of exercise. Patient independent in their exercise. Patient has met program and personal goals.  Smoking History:  Social History   Tobacco Use  Smoking Status Every Day   Packs/day: 0.50   Years: 45.00   Total pack years: 22.50   Types: Cigarettes  Smokeless Tobacco Never  Tobacco Comments   11/6- currently smoking 5 cigarettes a day     Diagnosis:  Chronic lymphocytic leukemia ([place])  Initial Exercise Prescription:  Initial Exercise Prescription - 08/01/22 1500       Date of Initial Exercise RX and Referring Provider   Date 08/01/22    Referring Provider Charlaine Dalton MD      Oxygen   Maintain Oxygen Saturation 88% or higher      Treadmill   MPH 2.8    Grade 0.5    Minutes 15    METs 3.34      Recumbant Bike   Level 3    RPM 60    Watts 36    Minutes 15    METs 3.5      REL-XR   Level 3    Speed 50    Minutes 15    METs 3.5      T5 Nustep   Level 2    SPM 80    Minutes 15    METs 3.5      Prescription Details   Frequency (times per week) 2    Duration Progress to 30 minutes of continuous aerobic without signs/symptoms of physical distress      Intensity   THRR 40-80% of Max Heartrate 115- 139    Ratings of Perceived Exertion 11-13    Perceived Dyspnea 0-4      Progression   Progression Continue to progress workloads to maintain intensity without signs/symptoms of physical distress.      Resistance Training   Training Prescription Yes    Weight 4 lb    Reps 10-15             Discharge Exercise Prescription (Final Exercise Prescription Changes):  Exercise Prescription Changes - 10/26/22 1200       Response to Exercise   Blood Pressure (Admit) 96/58    Blood Pressure (Exercise) 134/72    Blood Pressure  (Exit) 102/58    Heart Rate (Admit) 87 bpm    Heart Rate (Exercise) 138 bpm    Heart Rate (Exit) 92 bpm    Oxygen Saturation (Admit) 97 %    Oxygen Saturation (Exercise) 95 %    Oxygen Saturation (Exit) 97 %    Rating of Perceived Exertion (Exercise) 15    Symptoms none    Duration Continue with 30 min of aerobic exercise without signs/symptoms of physical distress.    Intensity THRR unchanged      Progression   Progression Continue to progress workloads to maintain intensity without signs/symptoms of physical distress.    Average METs 4.45      Resistance Training   Training Prescription Yes    Weight 5 lb    Reps 10-15      Interval Training   Interval Training Yes    Equipment Treadmill      Treadmill   MPH 3.5    Grade 4    Minutes 15  METs 5.61      Recumbant Bike   Level 8    Watts 54    Minutes 15    METs 4.19      Arm Ergometer   Level 1    Minutes 15      T5 Nustep   Level 4    Minutes 15    METs 4      Oxygen   Maintain Oxygen Saturation 88% or higher             Functional Capacity:  6 Minute Walk     Row Name 08/01/22 1517 10/12/22 1536       6 Minute Walk   Phase Initial Discharge    Distance 1585 feet 1845 feet    Distance % Change -- 16.4 %    Distance Feet Change -- 260 ft    Walk Time 6 minutes 6 minutes    # of Rest Breaks 0 0    MPH 3 3.49    METS 3.5 3.96    RPE 12 14    Perceived Dyspnea  0 0    VO2 Peak 12.25 13.88    Symptoms No No    Resting HR 91 bpm 97 bpm    Resting BP 122/62 100/68    Resting Oxygen Saturation  97 % 98 %    Exercise Oxygen Saturation  during 6 min walk 96 % 97 %    Max Ex. HR 114 bpm 128 bpm    Max Ex. BP 128/68 144/72    2 Minute Post BP 106/6 --              Nutrition & Weight - Outcomes:  Pre Biometrics - 08/01/22 1513       Pre Biometrics   Height 5' 5.5" (1.664 m)    Weight 171 lb 3.2 oz (77.7 kg)    BMI (Calculated) 28.05    Single Leg Stand 30 seconds              Post Biometrics - 10/12/22 1536        Post  Biometrics   Height 5' 5.5" (1.664 m)    Weight 170 lb (77.1 kg)    BMI (Calculated) 27.85    Single Leg Stand 30 seconds               Goals reviewed with patient; copy given to patient.

## 2022-10-26 NOTE — Progress Notes (Signed)
Daily Session Note  Patient Details  Name: Andres Mullins MRN: [id number] Date of Birth: [birth year] Referring Provider:   April Manson Cancer Associated Rehabilitation & Exercise from 08/01/2022 in [facility]  Referring Provider Charlaine Dalton MD       Encounter Date: 10/26/2022  Check In:  Session Check In - 10/26/22 1245       Check-In   Supervising physician immediately available to respond to emergencies See telemetry face sheet for immediately available ER MD    Location [facility]    Staff Present Coralie Keens, MS, ASCM CEP, Exercise Physiologist;Jessica Luan Pulling, MA, RCEP, CCRP, CCET    Virtual Visit No    Medication changes reported     No    Fall or balance concerns reported    No    Tobacco Cessation No Change    Warm-up and Cool-down Performed on first and last piece of equipment    Resistance Training Performed Yes   yoga   PAD/SET Patient? No                Social History   Tobacco Use  Smoking Status Every Day   Packs/day: 0.50   Years: 45.00   Total pack years: 22.50   Types: Cigarettes  Smokeless Tobacco Never  Tobacco Comments   11/6- currently smoking 5 cigarettes a day     Goals Met:  Proper associated with RPD/PD & O2 Sat Exercise tolerated well No report of concerns or symptoms today Strength training completed today  Goals Unmet:  Not Applicable  Comments: Pt able to follow exercise prescription today without complaint.  Will continue to monitor for progression.   Yoga completed today.   Dr. Emily Filbert is Medical Director for [facility].  Dr. Ottie Glazier is Medical Director for [facility].

## 2022-10-27 LAB — HISTOPLASMA GAL'MANNAN AG SER: Histoplasma Gal'mannan Ag Ser: <0.5 (ref <0.5)

## 2022-10-30 ENCOUNTER — Other Ambulatory Visit (HOSPITAL_COMMUNITY): Payer: Self-pay

## 2022-10-31 ENCOUNTER — Encounter: Payer: Medicare PPO | Attending: Internal Medicine | Admitting: *Deleted

## 2022-10-31 DIAGNOSIS — C9110 Chronic lymphocytic leukemia of B-cell type not having achieved remission: Secondary | ICD-10-CM

## 2022-10-31 NOTE — Progress Notes (Signed)
Discharge Progress Report  Patient Details  Name: Andres Mullins MRN: [id number] Date of Birth: September 19, 1951 Referring Provider:   April Manson [facility] Exercise from 08/01/2022 in [facility]  Referring Provider Charlaine Dalton MD        Number of Visits: 24  Reason for Discharge:  Patient reached a stable level of exercise. Patient independent in their exercise. Patient has met program and personal goals.  Smoking History:  Social History   Tobacco Use  Smoking Status Every Day   Packs/day: 0.50   Years: 45.00   Total pack years: 22.50   Types: Cigarettes  Smokeless Tobacco Never  Tobacco Comments   11/6- currently smoking 5 cigarettes a day     Diagnosis:  Chronic lymphocytic leukemia ([place])  ADL UCSD:   Initial Exercise Prescription:  Initial Exercise Prescription - 08/01/22 1500       Date of Initial Exercise RX and Referring Provider   Date 08/01/22    Referring Provider Charlaine Dalton MD      Oxygen   Maintain Oxygen Saturation 88% or higher      Treadmill   MPH 2.8    Grade 0.5    Minutes 15    METs 3.34      Recumbant Bike   Level 3    RPM 60    Watts 36    Minutes 15    METs 3.5      REL-XR   Level 3    Speed 50    Minutes 15    METs 3.5      T5 Nustep   Level 2    SPM 80    Minutes 15    METs 3.5      Prescription Details   Frequency (times per week) 2    Duration Progress to 30 minutes of continuous aerobic without signs/symptoms of physical distress      Intensity   THRR 40-80% of Max Heartrate 115- 139    Ratings of Perceived Exertion 11-13    Perceived Dyspnea 0-4      Progression   Progression Continue to progress workloads to maintain intensity without signs/symptoms of physical distress.      Resistance Training   Training Prescription Yes    Weight 4 lb    Reps 10-15             Discharge Exercise Prescription (Final Exercise Prescription  Changes):  Exercise Prescription Changes - 10/26/22 1200       Response to Exercise   Blood Pressure (Admit) 96/58    Blood Pressure (Exercise) 134/72    Blood Pressure (Exit) 102/58    Heart Rate (Admit) 87 bpm    Heart Rate (Exercise) 138 bpm    Heart Rate (Exit) 92 bpm    Oxygen Saturation (Admit) 97 %    Oxygen Saturation (Exercise) 95 %    Oxygen Saturation (Exit) 97 %    Rating of Perceived Exertion (Exercise) 15    Symptoms none    Duration Continue with 30 min of aerobic exercise without signs/symptoms of physical distress.    Intensity THRR unchanged      Progression   Progression Continue to progress workloads to maintain intensity without signs/symptoms of physical distress.    Average METs 4.45      Resistance Training   Training Prescription Yes    Weight 5 lb    Reps 10-15      Interval Training  Interval Training Yes    Equipment Treadmill      Treadmill   MPH 3.5    Grade 4    Minutes 15    METs 5.61      Recumbant Bike   Level 8    Watts 54    Minutes 15    METs 4.19      Arm Ergometer   Level 1    Minutes 15      T5 Nustep   Level 4    Minutes 15    METs 4      Oxygen   Maintain Oxygen Saturation 88% or higher             Functional Capacity:  6 Minute Walk     Row Name 08/01/22 1517 10/12/22 1536       6 Minute Walk   Phase Initial Discharge    Distance 1585 feet 1845 feet    Distance % Change -- 16.4 %    Distance Feet Change -- 260 ft    Walk Time 6 minutes 6 minutes    # of Rest Breaks 0 0    MPH 3 3.49    METS 3.5 3.96    RPE 12 14    Perceived Dyspnea  0 0    VO2 Peak 12.25 13.88    Symptoms No No    Resting HR 91 bpm 97 bpm    Resting BP 122/62 100/68    Resting Oxygen Saturation  97 % 98 %    Exercise Oxygen Saturation  during 6 min walk 96 % 97 %    Max Ex. HR 114 bpm 128 bpm    Max Ex. BP 128/68 144/72    2 Minute Post BP 106/6 --              Nutrition & Weight - Outcomes:  Pre Biometrics -  08/01/22 1513       Pre Biometrics   Height 5' 5.5" (1.664 m)    Weight 171 lb 3.2 oz (77.7 kg)    BMI (Calculated) 28.05    Single Leg Stand 30 seconds             Post Biometrics - 10/12/22 1536        Post  Biometrics   Height 5' 5.5" (1.664 m)    Weight 170 lb (77.1 kg)    BMI (Calculated) 27.85    Single Leg Stand 30 seconds              Goals reviewed with patient; copy given to patient.

## 2022-10-31 NOTE — Progress Notes (Signed)
Daily Session Note  Patient Details  Name: Andres Mullins MRN: [id number] Date of Birth: 11/28/1950 Referring Provider:   Flowsheet Row Cancer Associated Rehabilitation & Exercise from 08/01/2022 in [facility]  Referring Provider Brahmanday, Govinda MD       Encounter Date: 10/31/2022  Check In:  Session Check In - 10/31/22 1225       Check-In   Supervising physician immediately available to respond to emergencies See telemetry face sheet for immediately available ER MD    Location [facility]    Staff Present Jessica Hawkins, MA, RCEP, CCRP, CCET;Kara Williamson, MS, ACSM CEP, Exercise Physiologist    Virtual Visit No    Medication changes reported     No    Fall or balance concerns reported    No    Warm-up and Cool-down Performed on first and last piece of equipment    Resistance Training Performed Yes    VAD Patient? No    PAD/SET Patient? No      Pain Assessment   Currently in Pain? No/denies                Social History   Tobacco Use  Smoking Status Every Day   Packs/day: 0.50   Years: 45.00   Total pack years: 22.50   Types: Cigarettes  Smokeless Tobacco Never  Tobacco Comments   11/6- currently smoking 5 cigarettes a day     Goals Met:  Proper associated with RPD/PD & O2 Sat Independence with exercise equipment Exercise tolerated well No report of concerns or symptoms today Strength training completed today  Goals Unmet:  Not Applicable  Comments:  Andres Mullins graduated today from  rehab with 24 sessions completed.  Details of the patient's exercise prescription and what He needs to do in order to continue the prescription and progress were discussed with patient.  Patient was given a copy of prescription and goals.  Patient verbalized understanding.  Andres Mullins plans to continue to exercise by walking and hiking.    Dr. Mark Miller is Medical Director for [facility].  Dr. Fuad  Aleskerov is Medical Director for [facility]. 

## 2022-11-01 ENCOUNTER — Inpatient Hospital Stay: Payer: Medicare PPO | Attending: Internal Medicine

## 2022-11-01 ENCOUNTER — Inpatient Hospital Stay (HOSPITAL_BASED_OUTPATIENT_CLINIC_OR_DEPARTMENT_OTHER): Payer: Medicare PPO | Admitting: Internal Medicine

## 2022-11-01 ENCOUNTER — Encounter: Payer: Self-pay | Admitting: Internal Medicine

## 2022-11-01 VITALS — BP 106/75 | HR 80 | Temp 97.1°F | Resp 18 | Wt 170.6 lb

## 2022-11-01 DIAGNOSIS — C9110 Chronic lymphocytic leukemia of B-cell type not having achieved remission: Secondary | ICD-10-CM

## 2022-11-01 DIAGNOSIS — R161 Splenomegaly, not elsewhere classified: Secondary | ICD-10-CM | POA: Diagnosis not present

## 2022-11-01 DIAGNOSIS — Z818 Family history of other mental and behavioral disorders: Secondary | ICD-10-CM | POA: Diagnosis not present

## 2022-11-01 DIAGNOSIS — R590 Localized enlarged lymph nodes: Secondary | ICD-10-CM | POA: Diagnosis not present

## 2022-11-01 DIAGNOSIS — Z79899 Other long term (current) drug therapy: Secondary | ICD-10-CM | POA: Diagnosis not present

## 2022-11-01 DIAGNOSIS — E785 Hyperlipidemia, unspecified: Secondary | ICD-10-CM | POA: Diagnosis not present

## 2022-11-01 DIAGNOSIS — Z9049 Acquired absence of other specified parts of digestive tract: Secondary | ICD-10-CM | POA: Insufficient documentation

## 2022-11-01 DIAGNOSIS — K449 Diaphragmatic hernia without obstruction or gangrene: Secondary | ICD-10-CM | POA: Insufficient documentation

## 2022-11-01 DIAGNOSIS — M791 Myalgia, unspecified site: Secondary | ICD-10-CM | POA: Insufficient documentation

## 2022-11-01 DIAGNOSIS — Z8261 Family history of arthritis: Secondary | ICD-10-CM | POA: Diagnosis not present

## 2022-11-01 DIAGNOSIS — I2510 Atherosclerotic heart disease of native coronary artery without angina pectoris: Secondary | ICD-10-CM | POA: Insufficient documentation

## 2022-11-01 DIAGNOSIS — M2550 Pain in unspecified joint: Secondary | ICD-10-CM | POA: Diagnosis not present

## 2022-11-01 DIAGNOSIS — Z8249 Family history of ischemic heart disease and other diseases of the circulatory system: Secondary | ICD-10-CM | POA: Diagnosis not present

## 2022-11-01 DIAGNOSIS — F17210 Nicotine dependence, cigarettes, uncomplicated: Secondary | ICD-10-CM | POA: Insufficient documentation

## 2022-11-01 DIAGNOSIS — J449 Chronic obstructive pulmonary disease, unspecified: Secondary | ICD-10-CM | POA: Diagnosis not present

## 2022-11-01 DIAGNOSIS — Z86711 Personal history of pulmonary embolism: Secondary | ICD-10-CM | POA: Diagnosis not present

## 2022-11-01 LAB — CBC WITH DIFFERENTIAL/PLATELET
Abs Immature Granulocytes: 0.04 10*3/uL (ref 0.00–0.07)
Basophils Absolute: 0.1 10*3/uL (ref 0.0–0.1)
Basophils Relative: 1 %
Eosinophils Absolute: 0.2 10*3/uL (ref 0.0–0.5)
Eosinophils Relative: 3 %
HCT: 47.9 % (ref 39.0–52.0)
Hemoglobin: 16.5 g/dL (ref 13.0–17.0)
Immature Granulocytes: 1 %
Lymphocytes Relative: 20 %
Lymphs Abs: 1.5 10*3/uL (ref 0.7–4.0)
MCH: 30.8 pg (ref 26.0–34.0)
MCHC: 34.4 g/dL (ref 30.0–36.0)
MCV: 89.5 fL (ref 80.0–100.0)
Monocytes Absolute: 0.8 10*3/uL (ref 0.1–1.0)
Monocytes Relative: 10 %
Neutro Abs: 5.1 10*3/uL (ref 1.7–7.7)
Neutrophils Relative %: 65 %
Platelets: 242 10*3/uL (ref 150–400)
RBC: 5.35 MIL/uL (ref 4.22–5.81)
RDW: 13.3 % (ref 11.5–15.5)
WBC: 7.7 10*3/uL (ref 4.0–10.5)
nRBC: 0 % (ref 0.0–0.2)

## 2022-11-01 LAB — LACTATE DEHYDROGENASE: LDH: 117 U/L (ref 98–192)

## 2022-11-01 LAB — COMPREHENSIVE METABOLIC PANEL
ALT: 24 U/L (ref 0–44)
AST: 19 U/L (ref 15–41)
Albumin: 3.9 g/dL (ref 3.5–5.0)
Alkaline Phosphatase: 76 U/L (ref 38–126)
Anion gap: 8 (ref 5–15)
BUN: 10 mg/dL (ref 8–23)
CO2: 26 mmol/L (ref 22–32)
Calcium: 9.3 mg/dL (ref 8.9–10.3)
Chloride: 106 mmol/L (ref 98–111)
Creatinine, Ser: 0.81 mg/dL (ref 0.61–1.24)
GFR, Estimated: >60 mL/min (ref >60)
Glucose, Bld: 113 mg/dL — ABNORMAL HIGH (ref 70–99)
Potassium: 4.2 mmol/L (ref 3.5–5.1)
Sodium: 140 mmol/L (ref 135–145)
Total Bilirubin: 0.6 mg/dL (ref 0.3–1.2)
Total Protein: 6.5 g/dL (ref 6.5–8.1)

## 2022-11-01 LAB — URIC ACID: Uric Acid, Serum: 5.7 mg/dL (ref 3.7–8.6)

## 2022-11-01 NOTE — Progress Notes (Signed)
[place] OFFICE PROGRESS NOTE  Patient Care Team: Teodora Medici, DO as PCP - General (Internal Medicine) End, Harrell Gave, MD as PCP - Cardiology (Cardiology) Lada, Satira Anis, MD as Attending Physician (Family Medicine) Brendolyn Patty, MD (Dermatology) Billey Co, MD as Consulting Physician (Urology) Cammie Sickle, MD as Consulting Physician (Hematology and Oncology) Cammie Sickle, MD as Consulting Physician (Oncology)   SUMMARY OF ONCOLOGIC HISTORY:  Oncology History Overview Note  # 2016- CLL-[flow] CD5 (+)/CD23 (+) clonal B-cell population, CLL/SLL phenotype, 18% of leukocytes, <5,000/uL, CD38 positive   # NOV 2020 PET [incidental]- Generalized Lymphadenopathy- left supraclav/ bil ax LN L> R; mesenteric LN; pelvic LN; FEB 5th 2021- 2.5cm left supraclav; Left ax LN- 2.4cm.   AUG 2021-Head and neck: The brain demonstrates symmetric appearing diffuse physiologic uptake without focal finding to suggest abnormality. In the left posterior and medial maxillary cavity, there is an area of mucoid deposition seen within SUV max of 28.8. This was seen in the previous study of November. The specific bony erosion, clear-cut soft tissue lesion is not detected. This probably represents a chronic sinus disease focus. The bilateral optic nerves and extraocular muscles to the largest nodes are seen on axial image 98. 1 now demonstrates a short axis measurement of 3.3 cm and the other more medially, a short axis measurement of 2.5 cm. Demonstrate symmetric increased uptake. The left, much greater than right, jugulodigastric and supraclavicular nodes have increased significantly in size and avidity since the previous study. Both of these lymph nodes demonstrate an SUV max of 3.9. No largest lymph node in the right subclavicular space is seen on axial image 104, demonstrating an SUV max of 3.3, and a short axis measurement of 1.4 cm.   On axial image 102, a right  paratracheal lymph node is again identified with increased avidity, located immediately posterior to the right thyroid pole. This area was noted previously, but now increased avidity is seen, with an SUV max of 8.6 in this area. Multiple smaller, and less avid lymph nodes scattered throughout the left greater than right neck are seen, as well as supraclavicular and infraclavicular areas.   Thorax: Significant axillary lymph nodes have increased significantly in size. These are also left greater than right, with the largest on the left seen on image #114 with a short axis measurement of 2.2 cm and an SUV max of 4.3. The largest on the right as seen on image 111, demonstrating a short axis of 1.4 cm and an SUV max of 4.0. Too numerous to count smaller and similarly avid lymph nodes in both axillae are also noted. These are all larger than previously seen and in general, more avid.  Numerous small AP window and lesser mediastinal lymph nodes are seen with minimal increased avidity, appearing similar in size and avidity to the previous study. No other significant mediastinal adenopathy. No hilar adenopathy detected. These likely also represent lymphoproliferative disease.  Hyperinflation of COPD, bibasilar dependent atelectasis, some paucity of lung tissue and biapical scarring are noted without other focal pulmonary mass or nodule, or acute pulmonary parenchymal disease. Physiologic uptake in the left heart is seen, as expected. Small hiatal hernia is again identified.   Abdomen pelvis: Physiologic uptake in the liver, gut and urinary tract is again noted. No focal area of asymmetric uptake in the structures is seen. The spleen also demonstrates generalized increased uptake and stable splenomegaly, up to 14.2 cm. On image 198, a preaortic lymph node is detected of  increased size since the previous study, now measuring up to 1.2 cm short axis.  Several large lymph nodes on image 203 of the mesentery are noted.  The largest of these, on image 201 and ureters up to 1.8 cm short axis. Despite the fact that measurable avidity is not detected in these are the surrounding lymph nodes, these are doubtless involved in the lymphoproliferative disease described, and appear larger. Multiple smaller lymph nodes scattered throughout the mesentery are also seen.  Grossly enlarged right greater than left common iliac chain lymph nodes are again seen, throughout the pelvis and into the inguinal areas bilaterally. The most avid on the right is seen on image #260 to, demonstrates a short axis of 2.5 cm, and an SUV max of 4.9. The largest on the left is identified image #272, demonstrates a short axis of 2.0 cm, and an SUV max of 4.7. These are similar to the previously noted values.  Right greater than left inguinal adenopathy is seen, with the largest lymph node seen on the right image 276, demonstrating a short axis of 1.5 cm and an SUV max of 3.9. This is larger in size and avidity than previously seen. There are bilateral enlarged lymph nodes associated with the external iliac vessels on image 277. These measure 2.0 cm on the right in short axis, with an SUV max of 3.9.  On the left, image 278, the external iliac lymph node is larger at 1.8 cm short axis, but with an SUV max of only 3.8. Lesser avid and smaller lymph nodes on both sides are also detected.   Musculoskeletal: Scattered, insignificant uptake is detected in the muscles especially in the bilateral joints, without focal finding to suggest lymphoproliferative disease.   IMPRESSIONS:  Significant increase in numbers, short axis size, and to a large extent, avidity -in the lymph nodes of the left greater than right neck, supraclavicular and infraclavicular areas, bilateral axillary lymph nodes and common iliac chain, external iliac chain and inguinal lymph nodes, consistent with worsening lymphoproliferative disease. The mediastinal lymph nodes seen are still present and  unchanged in size or [significantly] in avidity.    [place]- Bx- SLL/CLL.July 2022-Discussed with Dr. Asquith-oncologist, [place]-reviewed the pathology of the lymph node biopsy in 2021-positive for SLL; negative for cyclin D1/1114 translocation.    # July 20th, 2022- GAZYVA #1 Aletha Halim plan ading VENATOCLAX after 2 cycles/de-bulking] s/p GAYZYVA [x6]- Feb 2023 - cycle #1  Venatoclax- x1 month- HELD sec to repeated hospitalization  # DISCONTINUED Marijo Sanes [sec to intolearnce]  # PE acute [NOV 7782] provoked--currently s/p  Eliquis x 6 months [end of  April 2023].    # MAY 1st, 2023- START ACALABRUTINIB 100 mg BID  # December 2022-pericarditis/effusion needing pericardiocentesis; FEB 2023-acute respiratory failure-hospitalization-? Etiology [s/p ID; Pul; Dr.A; cards- [facility]]  # Lung cancer screening- on [facility]  # SURVIVORSHIP:   # GENETICS:   DIAGNOSIS:   STAGE:         ;  GOALS:  CURRENT/MOST RECENT THERAPY :       CLL (chronic lymphocytic leukemia) ([place])  10/29/2019 Initial Diagnosis   CLL (chronic lymphocytic leukemia) ([place])   06/15/2021 -  Chemotherapy   Patient is on Treatment Plan : LYMPHOMA CLL/SLL Venetoclax + Obinutuzumab q28d      INTERVAL HISTORY: Ambulating independently.  With wife.   A pleasant 71 year-old male patient with CLL/SLL currently most currently on acalabrutinib daily [except weekend PMs]  is here for follow-up.  Continues to complain of fatigued/myalgias.  Intermittently at nighttime.  Otherwise denies any lumps or bumps.  No recent hospitalization.  No fevers.  No chills. Currently skipping PM doses on weekends.   Denies any wheezing.  Denies any worsening cough.   Review of Systems  Constitutional:  Positive for malaise/fatigue. Negative for chills, diaphoresis, fever and weight loss.  HENT:  Negative for nosebleeds and sore throat.   Eyes:  Negative for double vision.  Respiratory:  Negative for cough, hemoptysis, sputum production, shortness of breath  and wheezing.   Cardiovascular:  Negative for chest pain, palpitations, orthopnea and leg swelling.  Gastrointestinal:  Negative for abdominal pain, blood in stool, constipation, diarrhea, heartburn, melena, nausea and vomiting.  Genitourinary:  Negative for dysuria, frequency and urgency.  Musculoskeletal:  Positive for joint pain and myalgias. Negative for back pain.  Skin: Negative.  Negative for itching and rash.  Neurological:  Negative for dizziness, tingling, focal weakness, weakness and headaches.  Endo/Heme/Allergies:  Does not bruise/bleed easily.  Psychiatric/Behavioral:  Negative for depression. The patient is not nervous/anxious and does not have insomnia.      PAST MEDICAL HISTORY :  Past Medical History:  Diagnosis Date   Atherosclerosis of coronary artery 09/28/2016   Chest CT Nov 2017   Atrial tachycardia    CLL (chronic lymphocytic leukemia) ([facility])    Elevated C-reactive protein (CRP)    Elevated PSA    Fractured sternum 1999   Herpes    History of rib fracture 1999   multiple   Hyperlipidemia    Psoriasis    Pulmonary embolism ([place])    Rheumatoid factor positive 02/2014   Rosacea    Thoracic aortic atherosclerosis ([place]) 09/28/2016   Chest CT Nov 2017   Tobacco abuse    Vitamin D deficiency disease     PAST SURGICAL HISTORY :   Past Surgical History:  Procedure Laterality Date   APPENDECTOMY  1974   IR REMOVAL TUN ACCESS W/ PORT W/O FL MOD SED  03/20/2022   PERICARDIOCENTESIS N/A 10/03/2021   Procedure: PERICARDIOCENTESIS;  Surgeon: Jettie Booze, MD;  Location: [facility];  Service: Cardiovascular;  Laterality: N/A;   PORTA CATH INSERTION N/A 06/06/2021   Procedure: PORTA CATH INSERTION;  Surgeon: Algernon Huxley, MD;  Location: [facility];  Service: Cardiovascular;  Laterality: N/A;   SHOULDER ARTHROSCOPY  10/23/2016   Procedure: ARTHROSCOPY SHOULDER WITH DISTAL CLAVICLE EXCISION, PARTIAL ACROMIONECTOMY, AND DEBRIDEMENT;  Surgeon:  Earnestine Leys, MD;  Location: [facility];  Service: Orthopedics;;   TIBIA FRACTURE SURGERY Left 2004   titanium rod, ORIF    FAMILY HISTORY :   Family History  Problem Relation Age of Onset   Heart disease Father    Osteoarthritis Father    Alzheimer's disease Father    Cancer Neg Hx    Diabetes Neg Hx    Hypertension Neg Hx    Stroke Neg Hx    COPD Neg Hx     SOCIAL HISTORY:   Social History   Tobacco Use   Smoking status: Every Day    Packs/day: 0.50    Years: 45.00    Total pack years: 22.50    Types: Cigarettes   Smokeless tobacco: Never   Tobacco comments:    11/6- currently smoking 5 cigarettes a day   Vaping Use   Vaping Use: Never used  Substance Use Topics   Alcohol use: Yes    Alcohol/week: 3.0 standard drinks of alcohol    Types: 3 Glasses of  wine per week    Comment: a couple glasses of wine a week   Drug use: No    ALLERGIES:  is allergic to gluten meal.  MEDICATIONS:  Current Outpatient Medications  Medication Sig Dispense Refill   acalabrutinib maleate (CALQUENCE) 100 MG tablet Take 1 tablet (100 mg) by mouth 2 (two) times daily. 60 tablet 2   clobetasol cream (TEMOVATE) 0.05 % Apply to affected areas psoriasis on legs qhs until clear or up to 1 month. Avoid face, groin, axilla. 60 g 1   indomethacin (INDOCIN) 25 MG capsule Take 1 capsule (25 mg total) by mouth 2 (two) times daily as needed. 60 capsule 0   oxyCODONE (OXY IR/ROXICODONE) 5 MG immediate release tablet Take 1 tablet (5 mg total) by mouth every 6 (six) hours as needed for moderate pain or severe pain. 30 tablet 0   Roflumilast (ZORYVE) 0.3 % CREA Apply 1 Application topically in the morning. Qam to legs for psoriasis 60 g 3   cholecalciferol (VITAMIN D3) 25 MCG (1000 UNIT) tablet Take 1,000 Units by mouth daily. (Patient not taking: Reported on 08/01/2022)     Tapinarof (VTAMA) 1 % CREA Apply 1 Application topically daily. Apply to aa at lower legs for psoriasis 60 g 2   No current  facility-administered medications for this visit.    PHYSICAL EXAMINATION: ECOG PERFORMANCE STATUS: 0 - Asymptomatic  BP 106/75 (BP Location: Left Arm, Patient Position: Sitting)   Pulse 80   Temp (!) 97.1 F (36.2 C) (Tympanic)   Resp 18   Wt 170 lb 9.6 oz (77.4 kg)   BMI 27.96 kg/m   Filed Weights   11/01/22 1000  Weight: 170 lb 9.6 oz (77.4 kg)    No significant lymphadenopathy lymphadenopathy in the neck/underarm.  Physical Exam HENT:     Head: Normocephalic and atraumatic.     Mouth/Throat:     Pharynx: No oropharyngeal exudate.  Eyes:     Pupils: Pupils are equal, round, and reactive to light.  Cardiovascular:     Rate and Rhythm: Normal rate and regular rhythm.  Pulmonary:     Effort: No respiratory distress.     Breath sounds: No wheezing.  Abdominal:     General: Bowel sounds are normal. There is no distension.     Palpations: Abdomen is soft. There is no mass.     Tenderness: There is no abdominal tenderness. There is no guarding or rebound.  Musculoskeletal:        General: No tenderness. Normal range of motion.     Cervical back: Normal range of motion and neck supple.  Skin:    General: Skin is warm.  Neurological:     Mental Status: He is alert and oriented to person, place, and time.  Psychiatric:        Mood and Affect: Affect normal.     LABORATORY DATA:  I have reviewed the data as listed    Component Value Date/Time   NA 140 11/01/2022 1005   NA 139 10/27/2021 0857   K 4.2 11/01/2022 1005   CL 106 11/01/2022 1005   CO2 26 11/01/2022 1005   GLUCOSE 113 (H) 11/01/2022 1005   BUN 10 11/01/2022 1005   BUN 16 10/27/2021 0857   CREATININE 0.81 11/01/2022 1005   CREATININE 0.84 10/06/2019 0000   CALCIUM 9.3 11/01/2022 1005   PROT 6.5 11/01/2022 1005   ALBUMIN 3.9 11/01/2022 1005   AST 19 11/01/2022 1005   ALT 24 11/01/2022  1005   ALKPHOS 76 11/01/2022 1005   BILITOT 0.6 11/01/2022 1005   GFRNONAA >60 11/01/2022 1005   GFRNONAA 91  10/06/2019 0000   GFRAA >60 12/26/2019 0842   GFRAA 106 10/06/2019 0000    No results found for: "SPEP", "UPEP"  Lab Results  Component Value Date   WBC 7.7 11/01/2022   NEUTROABS 5.1 11/01/2022   HGB 16.5 11/01/2022   HCT 47.9 11/01/2022   MCV 89.5 11/01/2022   PLT 242 11/01/2022      Chemistry      Component Value Date/Time   NA 140 11/01/2022 1005   NA 139 10/27/2021 0857   K 4.2 11/01/2022 1005   CL 106 11/01/2022 1005   CO2 26 11/01/2022 1005   BUN 10 11/01/2022 1005   BUN 16 10/27/2021 0857   CREATININE 0.81 11/01/2022 1005   CREATININE 0.84 10/06/2019 0000      Component Value Date/Time   CALCIUM 9.3 11/01/2022 1005   ALKPHOS 76 11/01/2022 1005   AST 19 11/01/2022 1005   ALT 24 11/01/2022 1005   BILITOT 0.6 11/01/2022 1005       RADIOGRAPHIC STUDIES: I have personally reviewed the radiological images as listed and agreed with the findings in the report. No results found.   ASSESSMENT & PLAN:   .CLL (chronic lymphocytic leukemia) ([place]) # SLL/CLL-patient currently on acalabrutinib 100 mg twice a day [since start of May 2023]- tolerating well except mild-moderate-severe fatigue/insominia [see below]. Continue acalbrutinib, but SKIPthe PM dose on weekend. Clinically, no evidence of any progression.  Labs within normal limits. JUNE 2023-CTA-NED. Will monitor for now. STABLE.   # Myalgias/Fatigue/ Insomnia: ? Related Acalabrutinib- Continue acalbrutinib, but SKIP the PM dose 4 days [Tuesday;Thursday;Sat & Sun]. Continue  melatonin 2-3 mg/day; s/p Exercise/CARE program.  # Psoriasis/? Arthritis- relative contraindication with TNF-Alpha, but not absolute.   # Active smoker: recommend quitting smoking.   # IV access: PIV; mediport explanted.   # Vaccines: ok with shingrix # [first in April, 2023];  S/p-flu/RSV/Pneumoanis vaccines; s/p covid.   # DISPOSITION: # follow up in 3  months MD; labs- cbc/cmp;URIC ACID; LDH;mag; CK;  Dr.B            Orders  Placed This Encounter  Procedures   CBC with Differential/Platelet    Standing Status:   Future    Standing Expiration Date:   11/01/2023   Comprehensive metabolic panel    Standing Status:   Future    Standing Expiration Date:   11/01/2023   Uric acid   Lactate dehydrogenase    Standing Status:   Future    Standing Expiration Date:   11/02/2023   Magnesium    Standing Status:   Future    Standing Expiration Date:   11/01/2023   CK    Standing Status:   Future    Standing Expiration Date:   11/02/2023            Cammie Sickle, MD 11/01/2022 12:01 PM

## 2022-11-01 NOTE — Assessment & Plan Note (Addendum)
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 Continue acalbrutinib, but SKIPthe PM dose on weekend. Clinically, no evidence of any progression.  Labs within normal limits. JUNE 2023-CTA-NED. Will monitor for now. STABLE.   # Myalgias/Fatigue/ Insomnia: ? Related Acalabrutinib- Continue acalbrutinib, but SKIP the PM dose 4 days [Tuesday;Thursday;Sat & Sun]. Continue  melatonin 2-3 mg/day; s/p Exercise/CARE program.  # Psoriasis/? Arthritis- relative contraindication with TNF-Alpha, but not absolute.   # Active smoker: recommend quitting smoking.   # IV access: PIV; mediport explanted.   # Vaccines: ok with shingrix # [first in April, 2023];  S/p-flu/RSV/Pneumoanis vaccines; s/p covid.   # DISPOSITION: # follow up in 3  months MD; labs- cbc/cmp;URIC ACID; LDH;mag; CK;  Dr.B

## 2022-11-01 NOTE — Progress Notes (Signed)
Would like to discuss possible treatment for psoriasis.  Continued fatigue with diffuse pain on exertion.

## 2022-11-02 ENCOUNTER — Ambulatory Visit (INDEPENDENT_AMBULATORY_CARE_PROVIDER_SITE_OTHER): Payer: Medicare PPO | Admitting: Internal Medicine

## 2022-11-02 ENCOUNTER — Encounter: Payer: Self-pay | Admitting: Internal Medicine

## 2022-11-02 VITALS — BP 122/76 | HR 89 | Temp 97.6°F | Resp 18 | Wt 171.4 lb

## 2022-11-02 DIAGNOSIS — L409 Psoriasis, unspecified: Secondary | ICD-10-CM

## 2022-11-02 DIAGNOSIS — Z86711 Personal history of pulmonary embolism: Secondary | ICD-10-CM

## 2022-11-02 DIAGNOSIS — C9110 Chronic lymphocytic leukemia of B-cell type not having achieved remission: Secondary | ICD-10-CM

## 2022-11-02 NOTE — Progress Notes (Signed)
Established Patient Office Visit  Subjective   Patient ID: Andres Mullins, male    DOB: 1951-04-26  Age: 71 y.o. MRN: [id number]  Chief Complaint  Patient presents with   Follow-up    HPI Andres Mullins is a 71 year old male here here for follow up on chronic medical conditions.   CLL: -First diagnosed 12/2019. Following with Dr. Rogue Bussing, Oncology. Last note from 11/01/22 reviewed.  -Currently on Acalabrutinib 100 mg BID since May 2023. Now getting a medication holiday with the PM dose a few days a week -Had been on Venatolax but this was discontinued due to multiple hospitalizations/pericarditis/pleural effusions  -No longer on Acyclovir, did have first dose of Shingles vaccine -Finished 6 rounds of infusions chemo - port removed and doing well overall.  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  Health Maintenance: -Blood work up to date   Patient Active Problem List   Diagnosis Date Noted   Pleural effusion due to CHF (congestive heart failure) ([place]) 01/17/2022   Fever with chills 40/98/1191   Acute diastolic CHF (congestive heart failure) ([place]) 01/16/2022   History of pericarditis 01/16/2022   Tobacco use disorder, continuous 12/19/2021   Pulmonary embolism ([place]) 10/16/2021   Sepsis due to pneumonia ([place]) 10/03/2021   Pericardial effusion 10/03/2021   Oropharyngeal candidiasis 10/03/2021   Hyponatremia 10/03/2021   Acute hypoxemic respiratory failure ([place]) 10/03/2021   Cardiac tamponade 10/03/2021   CLL (chronic lymphocytic leukemia) ([place]) 10/29/2019   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   Welcome to Medicare preventive visit  11/15/2018   Renal lesion 11/12/2018   Prostate enlargement 11/12/2018   Acquired trigger finger 11/12/2018   Trigger middle finger of right hand 01/02/2018   Positive anti-CCP test 12/12/2017   Rheumatoid factor positive 12/12/2017   Trigger finger of both hands 12/12/2017   Bilateral hand pain 05/18/2017   Preventative health care 05/18/2017   Screen for colon cancer 05/18/2017   Coronary artery calcification 01/18/2017   Supraspinatus tendinitis, left 10/22/2016   Impingement syndrome of shoulder region 10/06/2016   Celiac disease 10/05/2016   Atherosclerosis of coronary artery 09/28/2016   Thoracic aortic atherosclerosis ([place]) 09/28/2016   Personal history of tobacco use, presenting hazards to health 09/26/2016   Lipoma of head 10/19/2015   Tendonitis, Achilles, left 10/14/2015   Herpes    Rosacea    Psoriasis    Hyperlipidemia    Vitamin D deficiency disease    Tobacco abuse    Elevated PSA    Elevated C-reactive protein (CRP)    Past Medical History:  Diagnosis Date   Atherosclerosis of coronary artery 09/28/2016   Chest CT Nov 2017   Atrial tachycardia    CLL (chronic lymphocytic leukemia) ([facility])    Elevated C-reactive protein (CRP)    Elevated PSA    Fractured sternum 1999   Herpes    History of rib fracture 1999   multiple   Hyperlipidemia    Psoriasis    Pulmonary embolism ([place])    Rheumatoid factor positive 02/2014   Rosacea    Thoracic aortic atherosclerosis ([place]) 09/28/2016   Chest CT Nov 2017  Tobacco abuse    Vitamin D deficiency disease    Past Surgical History:  Procedure Laterality Date   APPENDECTOMY  1974   IR REMOVAL TUN ACCESS W/ PORT W/O FL MOD SED  03/20/2022   PERICARDIOCENTESIS N/A 10/03/2021   Procedure: PERICARDIOCENTESIS;  Surgeon: Jettie Booze, MD;  Location: [facility];  Service: Cardiovascular;  Laterality: N/A;   PORTA CATH INSERTION N/A 06/06/2021   Procedure: PORTA CATH INSERTION;  Surgeon: Algernon Huxley, MD;   Location: [facility];  Service: Cardiovascular;  Laterality: N/A;   SHOULDER ARTHROSCOPY  10/23/2016   Procedure: ARTHROSCOPY SHOULDER WITH DISTAL CLAVICLE EXCISION, PARTIAL ACROMIONECTOMY, AND DEBRIDEMENT;  Surgeon: Earnestine Leys, MD;  Location: [facility];  Service: Orthopedics;;   TIBIA FRACTURE SURGERY Left 2004   titanium rod, ORIF   Social History   Tobacco Use   Smoking status: Every Day    Packs/day: 0.50    Years: 45.00    Total pack years: 22.50    Types: Cigarettes   Smokeless tobacco: Never   Tobacco comments:    11/6- currently smoking 5 cigarettes a day   Vaping Use   Vaping Use: Never used  Substance Use Topics   Alcohol use: Yes    Alcohol/week: 3.0 standard drinks of alcohol    Types: 3 Glasses of wine per week    Comment: a couple glasses of wine a week   Drug use: No   Social History   Socioeconomic History   Marital status: Married    Spouse name: colleen   Number of children: Not on file   Years of education: 12   Highest education level: Bachelor's degree (e.g., BA, AB, BS)  Occupational History   Occupation: retired  Tobacco Use   Smoking status: Every Day    Packs/day: 0.50    Years: 45.00    Total pack years: 22.50    Types: Cigarettes   Smokeless tobacco: Never   Tobacco comments:    11/6- currently smoking 5 cigarettes a day   Vaping Use   Vaping Use: Never used  Substance and Sexual Activity   Alcohol use: Yes    Alcohol/week: 3.0 standard drinks of alcohol    Types: 3 Glasses of wine per week    Comment: a couple glasses of wine a week   Drug use: No   Sexual activity: Yes  Other Topics Concern   Not on file  Social History Narrative   Consumes ~4 cups of coffee/day   Social Determinants of Health   Financial Resource Strain: Low Risk  (12/11/2019)   Overall Financial Resource Strain (CARDIA)    Difficulty of Paying Living Expenses: Not hard at all  Food Insecurity: No Food Insecurity (12/11/2019)   Hunger Vital Sign     Worried About Running Out of Food in the Last Year: Never true    Ran Out of Food in the Last Year: Never true  Transportation Needs: No Transportation Needs (12/11/2019)   PRAPARE - Hydrologist (Medical): No    Lack of Transportation (Non-Medical): No  Physical Activity: Insufficiently Active (12/11/2019)   Exercise Vital Sign    Days of Exercise per Week: 3 days    Minutes of Exercise per Session: 30 min  Stress: No Stress Concern Present (12/11/2019)   [place]    Feeling of Stress : Not at all  Social Connections: Moderately Isolated (12/11/2019)  Social Licensed conveyancer [NHANES]    Frequency of Communication with Friends and Family: Once a week    Frequency of Social Gatherings with Friends and Family: Once a week    Attends Religious Services: Never    Marine scientist or Organizations: Yes    Attends Music therapist: More than 4 times per year    Marital Status: Married  Human resources officer Violence: Not At Risk (12/11/2019)   Humiliation, Afraid, Rape, and Kick questionnaire    Fear of Current or Ex-Partner: No    Emotionally Abused: No    Physically Abused: No    Sexually Abused: No   Family Status  Relation Name Status   Father  Alive   Mother  Other       unsure   Sister  Alive   Brother  Alive   MGM unknown Other   MGF unknown Other   PGM  Deceased       natural cause   PGF  Deceased       natural cause   Neg Hx  (Not Specified)   Family History  Problem Relation Age of Onset   Heart disease Father    Osteoarthritis Father    Alzheimer's disease Father    Cancer Neg Hx    Diabetes Neg Hx    Hypertension Neg Hx    Stroke Neg Hx    COPD Neg Hx    Allergies  Allergen Reactions   Gluten Meal     Gluten intolerance       Review of Systems  Constitutional:  Negative for chills and fever.  Respiratory:  Negative for cough  and shortness of breath.   Cardiovascular:  Negative for chest pain.  Neurological:  Negative for weakness.      Objective:     BP 122/76   Pulse 89   Temp 97.6 F (36.4 C)   Resp 18   Wt 171 lb 6.4 oz (77.7 kg)   SpO2 99%   BMI 28.09 kg/m  BP Readings from Last 3 Encounters:  11/02/22 122/76  11/01/22 106/75  09/01/22 92/60   Wt Readings from Last 3 Encounters:  11/02/22 171 lb 6.4 oz (77.7 kg)  11/01/22 170 lb 9.6 oz (77.4 kg)  10/12/22 170 lb (77.1 kg)      Physical Exam Constitutional:      Appearance: Normal appearance.  HENT:     Head: Normocephalic and atraumatic.  Eyes:     Conjunctiva/sclera: Conjunctivae normal.  Cardiovascular:     Rate and Rhythm: Normal rate and regular rhythm.  Pulmonary:     Effort: Pulmonary effort is normal.     Breath sounds: Normal breath sounds.  Skin:    General: Skin is warm and dry.  Neurological:     General: No focal deficit present.     Mental Status: He is alert. Mental status is at baseline.  Psychiatric:        Mood and Affect: Mood normal.        Behavior: Behavior normal.     No results found for any visits on 11/02/22.  Last CBC Lab Results  Component Value Date   WBC 7.7 11/01/2022   HGB 16.5 11/01/2022   HCT 47.9 11/01/2022   MCV 89.5 11/01/2022   MCH 30.8 11/01/2022   RDW 13.3 11/01/2022   PLT 242 56/38/9373   Last metabolic panel Lab Results  Component Value Date   GLUCOSE 113 (H) 11/01/2022  NA 140 11/01/2022   K 4.2 11/01/2022   CL 106 11/01/2022   CO2 26 11/01/2022   BUN 10 11/01/2022   CREATININE 0.81 11/01/2022   GFRNONAA >60 11/01/2022   CALCIUM 9.3 11/01/2022   PHOS 3.5 12/27/2021   PROT 6.5 11/01/2022   ALBUMIN 3.9 11/01/2022   BILITOT 0.6 11/01/2022   ALKPHOS 76 11/01/2022   AST 19 11/01/2022   ALT 24 11/01/2022   ANIONGAP 8 11/01/2022   Last lipids Lab Results  Component Value Date   CHOL 194 10/06/2019   HDL 45 10/06/2019   LDLCALC 124 (H) 10/06/2019   TRIG 136  10/06/2019   CHOLHDL 4.3 10/06/2019   Last hemoglobin A1c Lab Results  Component Value Date   HGBA1C 5.5 09/07/2021   Last thyroid functions Lab Results  Component Value Date   TSH 1.237 10/04/2021   Last vitamin D Lab Results  Component Value Date   VD25OH 27 (L) 12/19/2021   Last vitamin B12 and Folate No results found for: "VITAMINB12", "FOLATE"    The ASCVD Risk score (Arnett DK, et al., 2019) failed to calculate for the following reasons:   Cannot find a previous HDL lab   Cannot find a previous total cholesterol lab    Assessment & Plan:   1. CLL (chronic lymphocytic leukemia) ([place]): Stable, note from Oncology reviewed from 11/01/22. Labs from that date reviewed as well.   2. History of pulmonary embolus (PE): No longer on anticoagulation therapy, denies shortness of breath, legs swelling. Continue to monitor.   3. Psoriasis: Stable, following with Dermatology, currently on  Zoryve and Clobestol PRN.    Return in 6 months (on 05/04/2023) for 6 month medical, AMW.    Teodora Medici, DO

## 2022-11-03 ENCOUNTER — Other Ambulatory Visit: Payer: Self-pay

## 2022-11-14 ENCOUNTER — Other Ambulatory Visit (HOSPITAL_COMMUNITY): Payer: Self-pay

## 2022-11-28 ENCOUNTER — Other Ambulatory Visit (HOSPITAL_COMMUNITY): Payer: Self-pay

## 2022-11-28 ENCOUNTER — Encounter: Payer: Self-pay | Admitting: Internal Medicine

## 2022-11-28 ENCOUNTER — Telehealth: Payer: Self-pay

## 2022-11-28 ENCOUNTER — Other Ambulatory Visit: Payer: Self-pay

## 2022-11-28 DIAGNOSIS — C9110 Chronic lymphocytic leukemia of B-cell type not having achieved remission: Secondary | ICD-10-CM

## 2022-11-28 MED ORDER — CALQUENCE 100 MG PO TABS: 100.0000 mg | ORAL_TABLET | Freq: Two times a day (BID) | ORAL | 2 refills | Status: DC

## 2022-11-28 MED ORDER — CALQUENCE 100 MG PO TABS
100.0000 mg | ORAL_TABLET | Freq: Two times a day (BID) | ORAL | 2 refills | Status: DC
  Filled 2022-11-28 – 2022-11-30 (×3): qty 60, 30d supply, fill #0
  Filled 2022-12-22: qty 60, 30d supply, fill #1
  Filled 2023-01-17: qty 60, 30d supply, fill #2

## 2022-11-28 NOTE — Telephone Encounter (Signed)
Oral Oncology Patient Advocate Encounter   Began application for assistance for [employer] through [employer].   Application will be submitted upon completion of necessary supporting documentation.   [employer]'s phone number [phone].   I will continue to check the status until final determination.   Berdine Addison, [facility] Patient [place]  [phone] (phone) [phone] (fax) 11/28/2022 8:41 AM

## 2022-11-28 NOTE — Telephone Encounter (Signed)
Oral Oncology Patient Advocate Encounter   Submitted application for assistance for Calquence to [employer].   Application submitted via Web Portal   [employer]'s phone number [phone].   I will continue to check the status until final determination.   Berdine Addison, [facility] Patient [place]  [phone] (phone) [phone] (fax) 11/28/2022 8:42 AM

## 2022-11-28 NOTE — Telephone Encounter (Signed)
Patient has [employer] for Life as Secondary insurance to his Part D. Billing that brings co-pay to $0. I have called and cancelled enrollment to [employer] and patient will continue to fill with [facility].   Berdine Addison, [facility] Patient [place]  [phone] (phone) [phone] (fax) 11/28/2022 10:26 AM

## 2022-11-28 NOTE — Telephone Encounter (Signed)
Oral Oncology Patient Advocate Encounter   Received notification that the application for assistance for Calquence through [employer] has been approved.   [employer]'s phone number [phone].   Effective dates: 11/28/22 through 11/27/23  I have spoken to the patient.  Berdine Addison, [facility] Patient [place]  [phone] (phone) [phone] (fax) 11/28/2022 8:44 AM

## 2022-11-29 ENCOUNTER — Other Ambulatory Visit: Payer: Self-pay

## 2022-11-30 ENCOUNTER — Other Ambulatory Visit (HOSPITAL_COMMUNITY): Payer: Self-pay

## 2022-11-30 ENCOUNTER — Other Ambulatory Visit: Payer: Self-pay

## 2022-12-19 ENCOUNTER — Other Ambulatory Visit (HOSPITAL_COMMUNITY): Payer: Self-pay

## 2022-12-22 ENCOUNTER — Other Ambulatory Visit (HOSPITAL_COMMUNITY): Payer: Self-pay

## 2022-12-25 ENCOUNTER — Other Ambulatory Visit: Payer: Self-pay

## 2023-01-02 DIAGNOSIS — M65341 Trigger finger, right ring finger: Secondary | ICD-10-CM | POA: Diagnosis not present

## 2023-01-17 ENCOUNTER — Other Ambulatory Visit (HOSPITAL_COMMUNITY): Payer: Self-pay

## 2023-01-31 ENCOUNTER — Inpatient Hospital Stay (HOSPITAL_BASED_OUTPATIENT_CLINIC_OR_DEPARTMENT_OTHER): Payer: Medicare PPO | Admitting: Internal Medicine

## 2023-01-31 ENCOUNTER — Inpatient Hospital Stay: Payer: Medicare PPO | Attending: Internal Medicine

## 2023-01-31 ENCOUNTER — Other Ambulatory Visit (HOSPITAL_COMMUNITY): Payer: Self-pay

## 2023-01-31 DIAGNOSIS — E785 Hyperlipidemia, unspecified: Secondary | ICD-10-CM | POA: Insufficient documentation

## 2023-01-31 DIAGNOSIS — Z8261 Family history of arthritis: Secondary | ICD-10-CM | POA: Diagnosis not present

## 2023-01-31 DIAGNOSIS — Z8249 Family history of ischemic heart disease and other diseases of the circulatory system: Secondary | ICD-10-CM | POA: Insufficient documentation

## 2023-01-31 DIAGNOSIS — C9110 Chronic lymphocytic leukemia of B-cell type not having achieved remission: Secondary | ICD-10-CM | POA: Diagnosis not present

## 2023-01-31 DIAGNOSIS — Z9049 Acquired absence of other specified parts of digestive tract: Secondary | ICD-10-CM | POA: Insufficient documentation

## 2023-01-31 DIAGNOSIS — M791 Myalgia, unspecified site: Secondary | ICD-10-CM | POA: Diagnosis not present

## 2023-01-31 DIAGNOSIS — R5383 Other fatigue: Secondary | ICD-10-CM | POA: Insufficient documentation

## 2023-01-31 DIAGNOSIS — K449 Diaphragmatic hernia without obstruction or gangrene: Secondary | ICD-10-CM | POA: Diagnosis not present

## 2023-01-31 DIAGNOSIS — Z818 Family history of other mental and behavioral disorders: Secondary | ICD-10-CM | POA: Insufficient documentation

## 2023-01-31 DIAGNOSIS — M2550 Pain in unspecified joint: Secondary | ICD-10-CM | POA: Insufficient documentation

## 2023-01-31 DIAGNOSIS — M1990 Unspecified osteoarthritis, unspecified site: Secondary | ICD-10-CM | POA: Insufficient documentation

## 2023-01-31 DIAGNOSIS — Z79899 Other long term (current) drug therapy: Secondary | ICD-10-CM | POA: Diagnosis not present

## 2023-01-31 DIAGNOSIS — Z86711 Personal history of pulmonary embolism: Secondary | ICD-10-CM | POA: Insufficient documentation

## 2023-01-31 DIAGNOSIS — F17200 Nicotine dependence, unspecified, uncomplicated: Secondary | ICD-10-CM | POA: Insufficient documentation

## 2023-01-31 DIAGNOSIS — F17210 Nicotine dependence, cigarettes, uncomplicated: Secondary | ICD-10-CM | POA: Diagnosis not present

## 2023-01-31 DIAGNOSIS — R0683 Snoring: Secondary | ICD-10-CM | POA: Insufficient documentation

## 2023-01-31 LAB — COMPREHENSIVE METABOLIC PANEL
ALT: 25 U/L (ref 0–44)
AST: 20 U/L (ref 15–41)
Albumin: 4.2 g/dL (ref 3.5–5.0)
Alkaline Phosphatase: 79 U/L (ref 38–126)
Anion gap: 8 (ref 5–15)
BUN: 10 mg/dL (ref 8–23)
CO2: 26 mmol/L (ref 22–32)
Calcium: 9.5 mg/dL (ref 8.9–10.3)
Chloride: 104 mmol/L (ref 98–111)
Creatinine, Ser: 0.9 mg/dL (ref 0.61–1.24)
GFR, Estimated: >60 mL/min (ref >60)
Glucose, Bld: 62 mg/dL — ABNORMAL LOW (ref 70–99)
Potassium: 4 mmol/L (ref 3.5–5.1)
Sodium: 138 mmol/L (ref 135–145)
Total Bilirubin: 0.5 mg/dL (ref 0.3–1.2)
Total Protein: 7 g/dL (ref 6.5–8.1)

## 2023-01-31 LAB — CBC WITH DIFFERENTIAL/PLATELET
Abs Immature Granulocytes: 0.05 10*3/uL (ref 0.00–0.07)
Basophils Absolute: 0.1 10*3/uL (ref 0.0–0.1)
Basophils Relative: 1 %
Eosinophils Absolute: 0.2 10*3/uL (ref 0.0–0.5)
Eosinophils Relative: 2 %
HCT: 47.8 % (ref 39.0–52.0)
Hemoglobin: 16.4 g/dL (ref 13.0–17.0)
Immature Granulocytes: 0 %
Lymphocytes Relative: 14 %
Lymphs Abs: 1.5 10*3/uL (ref 0.7–4.0)
MCH: 31.1 pg (ref 26.0–34.0)
MCHC: 34.3 g/dL (ref 30.0–36.0)
MCV: 90.7 fL (ref 80.0–100.0)
Monocytes Absolute: 0.8 10*3/uL (ref 0.1–1.0)
Monocytes Relative: 7 %
Neutro Abs: 8.7 10*3/uL — ABNORMAL HIGH (ref 1.7–7.7)
Neutrophils Relative %: 76 %
Platelets: 277 10*3/uL (ref 150–400)
RBC: 5.27 MIL/uL (ref 4.22–5.81)
RDW: 13.5 % (ref 11.5–15.5)
WBC: 11.3 10*3/uL — ABNORMAL HIGH (ref 4.0–10.5)
nRBC: 0 % (ref 0.0–0.2)

## 2023-01-31 LAB — CK: Total CK: 73 U/L (ref 49–397)

## 2023-01-31 LAB — LACTATE DEHYDROGENASE: LDH: 130 U/L (ref 98–192)

## 2023-01-31 LAB — MAGNESIUM: Magnesium: 2.2 mg/dL (ref 1.7–2.4)

## 2023-01-31 NOTE — Progress Notes (Signed)
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   AUG 2021-Head and neck: The brain demonstrates symmetric appearing diffuse physiologic uptake without focal finding to suggest abnormality. In the left posterior and medial maxillary cavity, there is an area of mucoid deposition seen within SUV max of 28.8. This was seen in the previous study of November. The specific bony erosion, clear-cut soft tissue lesion is not detected. This probably represents a chronic sinus disease focus. The bilateral optic nerves and extraocular muscles to the largest nodes are seen on axial image 98. 1 now demonstrates a short axis measurement of 3.3 cm and the other more medially, a short axis measurement of 2.5 cm. Demonstrate symmetric increased uptake. The left, much greater than right, jugulodigastric and supraclavicular nodes have increased significantly in size and avidity since the previous study. Both of these lymph nodes demonstrate an SUV max of 3.9. No largest lymph node in the right subclavicular space is seen on axial image 104, demonstrating an SUV max of 3.3, and a short axis measurement of 1.4 cm.   On axial image 102, a right  paratracheal lymph node is again identified with increased avidity, located immediately posterior to the right thyroid pole. This area was noted previously, but now increased avidity is seen, with an SUV max of 8.6 in this area. Multiple smaller, and less avid lymph nodes scattered throughout the left greater than right neck are seen, as well as supraclavicular and infraclavicular areas.   Thorax: Significant axillary lymph nodes have increased significantly in size. These are also left greater than right, with the largest on the left seen on image #114 with a short axis measurement of 2.2 cm and an SUV max of 4.3. The largest on the right as seen on image 111, demonstrating a short axis of 1.4 cm and an SUV max of 4.0. Too numerous to count smaller and similarly avid lymph nodes in both axillae are also noted. These are all larger than previously seen and in general, more avid.  Numerous small AP window and lesser mediastinal lymph nodes are seen with minimal increased avidity, appearing similar in size and avidity to the previous study. No other significant mediastinal adenopathy. No hilar adenopathy detected. These likely also represent lymphoproliferative disease.  Hyperinflation of COPD, bibasilar dependent atelectasis, some paucity of lung tissue and biapical scarring are noted without other focal pulmonary mass or nodule, or acute pulmonary parenchymal disease. Physiologic uptake in the left heart is seen, as expected. Small hiatal hernia is again identified.   Abdomen pelvis: Physiologic uptake in the liver, gut and urinary tract is again noted. No focal area of asymmetric uptake in the structures is seen. The spleen also demonstrates generalized increased uptake and stable splenomegaly, up to 14.2 cm. On image 198, a preaortic lymph node is detected of  increased size since the previous study, now measuring up to 1.2 cm short axis.  Several large lymph nodes on image 203 of the mesentery are noted.  The largest of these, on image 201 and ureters up to 1.8 cm short axis. Despite the fact that measurable avidity is not detected in these are the surrounding lymph nodes, these are doubtless involved in the lymphoproliferative disease described, and appear larger. Multiple smaller lymph nodes scattered throughout the mesentery are also seen.  Grossly enlarged right greater than left common iliac chain lymph nodes are again seen, throughout the pelvis and into the inguinal areas bilaterally. The most avid on the right is seen on image #260 to, demonstrates a short axis of 2.5 cm, and an SUV max of 4.9. The largest on the left is identified image #272, demonstrates a short axis of 2.0 cm, and an SUV max of 4.7. These are similar to the previously noted values.  Right greater than left inguinal adenopathy is seen, with the largest lymph node seen on the right image 276, demonstrating a short axis of 1.5 cm and an SUV max of 3.9. This is larger in size and avidity than previously seen. There are bilateral enlarged lymph nodes associated with the external iliac vessels on image 277. These measure 2.0 cm on the right in short axis, with an SUV max of 3.9.  On the left, image 278, the external iliac lymph node is larger at 1.8 cm short axis, but with an SUV max of only 3.8. Lesser avid and smaller lymph nodes on both sides are also detected.   Musculoskeletal: Scattered, insignificant uptake is detected in the muscles especially in the bilateral joints, without focal finding to suggest lymphoproliferative disease.   IMPRESSIONS:  Significant increase in numbers, short axis size, and to a large extent, avidity -in the lymph nodes of the left greater than right neck, supraclavicular and infraclavicular areas, bilateral axillary lymph nodes and common iliac chain, external iliac chain and inguinal lymph nodes, consistent with worsening lymphoproliferative disease. The mediastinal lymph nodes seen are still present and  unchanged in size or [significantly] in avidity.    [place]- Bx- SLL/CLL.July 2022-Discussed with Dr. Asquith-oncologist, [place]-reviewed the pathology of the lymph node biopsy in 2021-positive for SLL; negative for cyclin D1/1114 translocation.    # July 20th, 2022- GAZYVA #1 Aletha Halim plan ading VENATOCLAX after 2 cycles/de-bulking] s/p GAYZYVA [x6]- Feb 2023 - cycle #1  Venatoclax- x1 month- HELD sec to repeated hospitalization  # DISCONTINUED Marijo Sanes [sec to into[facility]]  # PE acute [NOV [id number] provoked--currently s/p  Eliquis x 6 months [end of  April 2023].    # MAY 1st, 2023- START ACALABRUTINIB 100 mg BID  # December 2022-pericarditis/effusion needing pericardiocentesis; FEB 2023-acute respiratory failure-hospitalization-? Etiology [s/p ID; Pul; Dr.A; cards- [facility]]  # Lung cancer screening- on [facility]  # SURVIVORSHIP:   # GENETICS:   DIAGNOSIS:   STAGE:         ;  GOALS:  CURRENT/MOST RECENT THERAPY :       CLL (chronic lymphocytic leukemia) ([place])  10/29/2019 Initial Diagnosis   CLL (chronic lymphocytic leukemia) ([place])   06/15/2021 -  Chemotherapy   Patient is on Treatment Plan : LYMPHOMA CLL/SLL Venetoclax + Obinutuzumab q28d      INTERVAL HISTORY: Ambulating independently.  With wife.   A pleasant 27 year-old male patient with CLL/SLL currently most currently on acalabrutinib daily [except weekend PMs]  is here for follow-up.  Pt states his only problem seems  to be lack of sleep related to his calquence.  So therefore he does feel fatigued. Continues to follow a gluten free diet and does not have any joint pains. Bowels are okay.  Otherwise denies any lumps or bumps.  No recent hospitalization.  No fevers.  No chills. Currently skipping PM doses on weekends.   Denies any wheezing.  Denies any worsening cough.   Review of Systems  Constitutional:  Positive for malaise/fatigue. Negative for chills, diaphoresis, fever and weight loss.  HENT:  Negative for nosebleeds and  sore throat.   Eyes:  Negative for double vision.  Respiratory:  Negative for cough, hemoptysis, sputum production, shortness of breath and wheezing.   Cardiovascular:  Negative for chest pain, palpitations, orthopnea and leg swelling.  Gastrointestinal:  Negative for abdominal pain, blood in stool, constipation, diarrhea, heartburn, melena, nausea and vomiting.  Genitourinary:  Negative for dysuria, frequency and urgency.  Musculoskeletal:  Positive for joint pain and myalgias. Negative for back pain.  Skin: Negative.  Negative for itching and rash.  Neurological:  Negative for dizziness, tingling, focal weakness, weakness and headaches.  Endo/Heme/Allergies:  Does not bruise/bleed easily.  Psychiatric/Behavioral:  Negative for depression. The patient is not nervous/anxious and does not have insomnia.      PAST MEDICAL HISTORY :  Past Medical History:  Diagnosis Date   Atherosclerosis of coronary artery 09/28/2016   Chest CT Nov 2017   Atrial tachycardia    CLL (chronic lymphocytic leukemia) ([facility])    Elevated C-reactive protein (CRP)    Elevated PSA    Fractured sternum 1999   Herpes    History of rib fracture 1999   multiple   Hyperlipidemia    Psoriasis    Pulmonary embolism ([place])    Rheumatoid factor positive 02/2014   Rosacea    Thoracic aortic atherosclerosis ([place]) 09/28/2016   Chest CT Nov 2017   Tobacco abuse    Vitamin D deficiency disease     PAST SURGICAL HISTORY :   Past Surgical History:  Procedure Laterality Date   APPENDECTOMY  1974   IR REMOVAL TUN ACCESS W/ PORT W/O FL MOD SED  03/20/2022   PERICARDIOCENTESIS N/A 10/03/2021   Procedure: PERICARDIOCENTESIS;  Surgeon: Jettie Booze, MD;  Location: [facility];  Service: Cardiovascular;  Laterality: N/A;   PORTA CATH INSERTION N/A 06/06/2021   Procedure: PORTA CATH INSERTION;  Surgeon: Algernon Huxley, MD;  Location: [facility];  Service: Cardiovascular;  Laterality: N/A;   SHOULDER  ARTHROSCOPY  10/23/2016   Procedure: ARTHROSCOPY SHOULDER WITH DISTAL CLAVICLE EXCISION, PARTIAL ACROMIONECTOMY, AND DEBRIDEMENT;  Surgeon: Earnestine Leys, MD;  Location: [facility];  Service: Orthopedics;;   TIBIA FRACTURE SURGERY Left 2004   titanium rod, ORIF    FAMILY HISTORY :   Family History  Problem Relation Age of Onset   Heart disease Father    Osteoarthritis Father    Alzheimer's disease Father    Cancer Neg Hx    Diabetes Neg Hx    Hypertension Neg Hx    Stroke Neg Hx    COPD Neg Hx     SOCIAL HISTORY:   Social History   Tobacco Use   Smoking status: Every Day    Packs/day: 0.50    Years: 45.00    Total pack years: 22.50    Types: Cigarettes   Smokeless tobacco: Never   Tobacco comments:    11/6- currently smoking 5 cigarettes a day   Vaping Use  Vaping Use: Never used  Substance Use Topics   Alcohol use: Yes    Alcohol/week: 3.0 standard drinks of alcohol    Types: 3 Glasses of wine per week    Comment: a couple glasses of wine a week   Drug use: No    ALLERGIES:  is allergic to gluten meal.  MEDICATIONS:  Current Outpatient Medications  Medication Sig Dispense Refill   acalabrutinib maleate (CALQUENCE) 100 MG tablet Take 1 tablet (100 mg) by mouth 2 (two) times daily. (Patient taking differently: Take 100 mg by mouth 2 (two) times daily. 1 tab every morning x 7 days and 1 in the evening on Mon Wed and Fridays) 60 tablet 2   cholecalciferol (VITAMIN D3) 25 MCG (1000 UNIT) tablet Take 1,000 Units by mouth daily.     Roflumilast (ZORYVE) 0.3 % CREA Apply 1 Application topically in the morning. Qam to legs for psoriasis 60 g 3   clobetasol cream (TEMOVATE) 0.05 % Apply to affected areas psoriasis on legs qhs until clear or up to 1 month. Avoid face, groin, axilla. (Patient not taking: Reported on 01/31/2023) 60 g 1   indomethacin (INDOCIN) 25 MG capsule Take 1 capsule (25 mg total) by mouth 2 (two) times daily as needed. (Patient not taking: Reported on  01/31/2023) 60 capsule 0   oxyCODONE (OXY IR/ROXICODONE) 5 MG immediate release tablet Take 1 tablet (5 mg total) by mouth every 6 (six) hours as needed for moderate pain or severe pain. (Patient not taking: Reported on 01/31/2023) 30 tablet 0   No current facility-administered medications for this visit.    PHYSICAL EXAMINATION: ECOG PERFORMANCE STATUS: 0 - Asymptomatic  BP 100/63 (BP Location: Left Arm, Patient Position: Sitting)   Pulse 80   Temp (!) 96.7 F (35.9 C) (Tympanic)   Resp 16   SpO2 100%   There were no vitals filed for this visit.   No significant lymphadenopathy lymphadenopathy in the neck/underarm.  Physical Exam HENT:     Head: Normocephalic and atraumatic.     Mouth/Throat:     Pharynx: No oropharyngeal exudate.  Eyes:     Pupils: Pupils are equal, round, and reactive to light.  Cardiovascular:     Rate and Rhythm: Normal rate and regular rhythm.  Pulmonary:     Effort: No respiratory distress.     Breath sounds: No wheezing.  Abdominal:     General: Bowel sounds are normal. There is no distension.     Palpations: Abdomen is soft. There is no mass.     Tenderness: There is no abdominal tenderness. There is no guarding or rebound.  Musculoskeletal:        General: No tenderness. Normal range of motion.     Cervical back: Normal range of motion and neck supple.  Skin:    General: Skin is warm.  Neurological:     Mental Status: He is alert and oriented to person, place, and time.  Psychiatric:        Mood and Affect: Affect normal.     LABORATORY DATA:  I have reviewed the data as listed    Component Value Date/Time   NA 138 01/31/2023 1422   NA 139 10/27/2021 0857   K 4.0 01/31/2023 1422   CL 104 01/31/2023 1422   CO2 26 01/31/2023 1422   GLUCOSE 62 (L) 01/31/2023 1422   BUN 10 01/31/2023 1422   BUN 16 10/27/2021 0857   CREATININE 0.90 01/31/2023 1422   CREATININE 0.84 10/06/2019  0000   CALCIUM 9.5 01/31/2023 1422   PROT 7.0 01/31/2023  1422   ALBUMIN 4.2 01/31/2023 1422   AST 20 01/31/2023 1422   ALT 25 01/31/2023 1422   ALKPHOS 79 01/31/2023 1422   BILITOT 0.5 01/31/2023 1422   GFRNONAA >60 01/31/2023 1422   GFRNONAA 91 10/06/2019 0000   GFRAA >60 12/26/2019 0842   GFRAA 106 10/06/2019 0000    No results found for: "SPEP", "UPEP"  Lab Results  Component Value Date   WBC 11.3 (H) 01/31/2023   NEUTROABS 8.7 (H) 01/31/2023   HGB 16.4 01/31/2023   HCT 47.8 01/31/2023   MCV 90.7 01/31/2023   PLT 277 01/31/2023      Chemistry      Component Value Date/Time   NA 138 01/31/2023 1422   NA 139 10/27/2021 0857   K 4.0 01/31/2023 1422   CL 104 01/31/2023 1422   CO2 26 01/31/2023 1422   BUN 10 01/31/2023 1422   BUN 16 10/27/2021 0857   CREATININE 0.90 01/31/2023 1422   CREATININE 0.84 10/06/2019 0000      Component Value Date/Time   CALCIUM 9.5 01/31/2023 1422   ALKPHOS 79 01/31/2023 1422   AST 20 01/31/2023 1422   ALT 25 01/31/2023 1422   BILITOT 0.5 01/31/2023 1422       RADIOGRAPHIC STUDIES: I have personally reviewed the radiological images as listed and agreed with the findings in the report. No results found.   ASSESSMENT & PLAN:   .CLL (chronic lymphocytic leukemia) ([place]) # SLL/CLL-patient currently on acalabrutinib 100 mg twice a day [since start of May 2023]- tolerating well except mild-moderate-severe fatigue/insominia [see below]. Continue acalbrutinib, but SKIP the PM dose Tuesday;Thursday;Sat & Sun.  # Clinically, no evidence of any progression.  Labs within normal limits. JUNE 2023-CTA-NED. Will monitor for now. Stable. Will repeat CT CAP in 3 month.   # Myalgias/Fatigue/ Insomnia: ? Related Acalabrutinib- Continue acalbrutinib, but SKIP the PM dose 4 days. s/p Exercise/CARE program. Stable.   # Psoriasis/? Arthritis- relative contraindication with TNF-Alpha, but not absolute- on topical zorvye- stable.   # Active smoker: recommend quitting smoking.   # IV access: PIV; mediport  explanted.   # Vaccines: ok with shingrix # [first in April, 2023];  S/p-flu/RSV/Pneumoanis vaccines; s/p covid.   # DISPOSITION: # follow up in 3  months MD; labs- cbc/cmp; URIC ACID; LDH;mag; CK; CT CAP- Dr.B     Orders Placed This Encounter  Procedures   CT CHEST ABDOMEN PELVIS W CONTRAST    Standing Status:   Future    Standing Expiration Date:   01/31/2024    Order Specific Question:   Preferred imaging location?    Answer:   [facility]    Order Specific Question:   Radiology Contrast Protocol - do NOT remove file path    Answer:   [email or web address]   Uric acid    Standing Status:   Future    Standing Expiration Date:   01/31/2024   CBC with Differential (Cancer Center Only)    Standing Status:   Future    Standing Expiration Date:   01/31/2024   CMP ([facility] only)    Standing Status:   Future    Standing Expiration Date:   01/31/2024   Lactate dehydrogenase    Standing Status:   Future    Standing Expiration Date:   01/31/2024   Magnesium    Standing Status:   Future    Standing Expiration Date:  01/31/2024   CK    Standing Status:   Future    Standing Expiration Date:   01/31/2024            Cammie Sickle, MD 01/31/2023 3:17 PM

## 2023-01-31 NOTE — Progress Notes (Signed)
Pt states his only problem seems to be lack of sleep related to his calquence.  So therefore he does feel fatigued. Continues to follow a gluten free diet and does not have any joint pains. Bowels are okay.

## 2023-01-31 NOTE — Assessment & Plan Note (Addendum)
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 Continue acalbrutinib, but SKIP the PM dose Tuesday;Thursday;Sat & Sun.  # Clinically, no evidence of any progression.  Labs within normal limits. JUNE 2023-CTA-NED. Will monitor for now. Stable. Will repeat CT CAP in 3 month.   # Myalgias/Fatigue/ Insomnia: ? Related Acalabrutinib- Continue acalbrutinib, but SKIP the PM dose 4 days. s/p Exercise/CARE program. Stable.   # Psoriasis/? Arthritis- relative contraindication with TNF-Alpha, but not absolute- on topical zorvye- stable.   # Active smoker: recommend quitting smoking.   # IV access: PIV; mediport explanted.   # Vaccines: ok with shingrix # [first in April, 2023];  S/p-flu/RSV/Pneumoanis vaccines; s/p covid.   # DISPOSITION: # follow up in 3  months MD; labs- cbc/cmp; URIC ACID; LDH;mag; CK; CT CAP- Dr.B

## 2023-02-22 ENCOUNTER — Other Ambulatory Visit (HOSPITAL_COMMUNITY): Payer: Self-pay

## 2023-02-27 ENCOUNTER — Other Ambulatory Visit: Payer: Self-pay

## 2023-02-27 DIAGNOSIS — M6530 Trigger finger, unspecified finger: Secondary | ICD-10-CM | POA: Diagnosis not present

## 2023-02-27 DIAGNOSIS — M65341 Trigger finger, right ring finger: Secondary | ICD-10-CM | POA: Diagnosis not present

## 2023-03-15 ENCOUNTER — Other Ambulatory Visit: Payer: Self-pay | Admitting: Internal Medicine

## 2023-03-15 ENCOUNTER — Other Ambulatory Visit (HOSPITAL_COMMUNITY): Payer: Self-pay

## 2023-03-15 DIAGNOSIS — C9110 Chronic lymphocytic leukemia of B-cell type not having achieved remission: Secondary | ICD-10-CM

## 2023-03-15 NOTE — Telephone Encounter (Signed)
CBC with Differential/Platelet Order: [id number] Status: Final result     Visible to patient: Yes (seen)     Next appt: 03/19/2023 at 09:15 AM in Dermatology Willeen Niece, MD)     Dx: CLL (chronic lymphocytic leukemia)   0 Result Notes          Component Ref Range & Units 1 mo ago (01/31/23) 4 mo ago (11/01/22) 6 mo ago (09/01/22) 8 mo ago (07/18/22) 9 mo ago (05/29/22) 10 mo ago (05/19/22) 10 mo ago (04/27/22)  WBC 4.0 - 10.5 K/uL 11.3 High  7.7 7.9 9.5 15.2 High  12.5 High  9.6  RBC 4.22 - 5.81 MIL/uL 5.27 5.35 5.11 4.90 5.12 5.07 5.22  Hemoglobin 13.0 - 17.0 g/dL 86.5 78.4 69.6 29.5 28.4 15.7 15.8  HCT 39.0 - 52.0 % 47.8 47.9 46.5 44.2 44.7 44.4 46.2  MCV 80.0 - 100.0 fL 90.7 89.5 91.0 90.2 87.3 87.6 88.5  MCH 26.0 - 34.0 pg 31.1 30.8 31.5 31.0 30.5 31.0 30.3  MCHC 30.0 - 36.0 g/dL 13.2 44.0 10.2 72.5 36.6 35.4 34.2  RDW 11.5 - 15.5 % 13.5 13.3 13.3 14.4 14.2 13.6 14.6  Platelets 150 - 400 K/uL 277 242 260 252 433 High  244 233  nRBC 0.0 - 0.2 % 0.0 0.0 0.0 0.0 0.0 0.0 0.0  Neutrophils Relative % % 76 65 72 68 86 76 75  Neutro Abs 1.7 - 7.7 K/uL 8.7 High  5.1 5.6 6.5 13.2 High  9.6 High  7.2  Lymphocytes Relative % Lymphs Abs 0.7 - 4.0 K/uL 1.5 1.5 1.3 1.7 0.9 1.4 1.4  Monocytes Relative % Monocytes Absolute 0.1 - 1.0 K/uL 0.8 0.8 0.6 1.1 High  0.2 1.3 High  0.8  Eosinophils Relative % 0 1 1  Eosinophils Absolute 0.0 - 0.5 K/uL 0.2 0.2 0.2 0.1 0.0 0.1 0.1  Basophils Relative % Basophils Absolute 0.0 - 0.1 K/uL 0.1 0.1 0.1 0.1 0.1 0.1 0.1  Immature Granulocytes % 0 1 0 0  Abs Immature Granulocytes 0.00 - 0.07 K/uL 0.05 0.04 CM 0.03 CM 0.06 CM 0.85 High  CM 0.06 CM 0.03 CM  Comment: Performed at [facility], [address] [place], [place] [address]  WBC Morphology     CONFIRMED BY MANUAL DIFF    RBC Morphology     MORPHOLOGY UNREMARKABLE    Smear Review     Normal platelet  morphology CM    Resulting Agency [facility]         Specimen Collected: 01/31/23 14:22 Last Resulted: 01/31/23 14:30      Lab Flowsheet      Order Details      View Encounter      Lab and Collection Details      Routing      Result History    View All Conversations on this Encounter      CM=Additional comments      Result Care Coordination   Patient Communication   Add Comments   Seen Back to Top      Other Results from 01/31/2023  CK Order: [id number] Status: Final result      Visible to patient: Yes (seen)  Next appt: 03/19/2023 at 09:15 AM in Dermatology Willeen Niece, MD)      Dx: CLL (chronic lymphocytic leukemia)    0 Result Notes      Component Ref Range & Units 1 mo ago  Total CK 49 - 397 U/L 73  Comment: Performed at [facility], [address] [place], [place] [address]  Resulting Agency [facility] CLIN LAB         Specimen Collected: 01/31/23 14:22 Last Resulted: 01/31/23 16:29      Lab Flowsheet       Order Details       View Encounter       Lab and Collection Details       Routing       Result History     View All Conversations on this Encounter        Result Care Coordination   Patient Communication   Add Comments   Seen Back to Top        Magnesium Order: [id number] Status: Final result      Visible to patient: Yes (seen)      Next appt: 03/19/2023 at 09:15 AM in Dermatology Willeen Niece, MD)      Dx: CLL (chronic lymphocytic leukemia)    0 Result Notes            Component Ref Range & Units 1 mo ago (01/31/23) 1 yr ago (12/27/21) 1 yr ago (12/09/21) 1 yr ago (12/02/21) 1 yr ago (11/25/21) 1 yr ago (10/16/21) 1 yr ago (10/10/21)  Magnesium 1.7 - 2.4 mg/dL 2.2 1.8 CM 2.0 CM 1.9 CM 2.0 CM 2.0 CM 2.3 CM  Comment: Performed at [facility], [address] [place], [place] [address]  Resulting Agency [facility]         Specimen Collected: 01/31/23 14:22 Last Resulted: 01/31/23 14:47      Lab Flowsheet       Order Details       View Encounter       Lab and Collection Details       Routing       Result History     View All Conversations on this Encounter      CM=Additional comments      Result Care Coordination   Patient Communication   Add Comments   Seen Back to Top        Lactate dehydrogenase Order: [id number] Status: Final result      Visible to patient: Yes (seen)      Next appt: 03/19/2023 at 09:15 AM in Dermatology Willeen Niece, MD)      Dx: CLL (chronic lymphocytic leukemia)    0 Result Notes            Component Ref Range & Units 1 mo ago 4 mo ago 6 mo ago 8 mo ago 9 mo ago 10 mo ago 11 mo ago  LDH 98 - 192 U/L 130 117 CM 122 CM 137 CM 155 CM 111 CM 124 CM  Comment: Performed at [facility], [address] [place], [place] [address]  Resulting Agency [facility] CLIN LAB         Specimen Collected: 01/31/23 14:22 Last Resulted: 01/31/23 14:47      Lab Flowsheet  Order Clinical research associate History     View All Conversations on this Encounter      CM=Additional comments      Result Care Coordination   Patient Communication   Add Comments   Seen Back to Top         Contains abnormal data Comprehensive metabolic panel Order: [id number] Status: Final result      Visible to patient: Yes (seen)      Next appt: 03/19/2023 at 09:15 AM in Dermatology Willeen Niece, MD)      Dx: CLL (chronic lymphocytic leukemia)    0 Result Notes            Component Ref Range & Units 1 mo ago (01/31/23) 4 mo ago (11/01/22) 6 mo ago (09/01/22) 8 mo ago (07/18/22) 9 mo ago (05/29/22) 10 mo ago (05/19/22) 10 mo  ago (04/27/22)  Sodium 135 - 145 mmol/L 138 140 137 139 137 133 Low  137  Potassium 3.5 - 5.1 mmol/L 4.0 4.2 4.1 4.1 4.3 3.8 4.3  Chloride 98 - 111 mmol/L 104 106 109 107 105 103 105  CO2 22 - 32 mmol/L Glucose, Bld 70 - 99 mg/dL 62 Low  409 High  CM 811 High  CM 75 CM 149 High  CM 102 High  CM 108 High  CM  Comment: Glucose reference range applies only to samples taken after fasting for at least 8 hours.  BUN 8 - 23 mg/dL Creatinine, Ser 0.61 - 1.24 mg/dL 9.14 7.82 9.56 2.13 0.86 0.83 0.76  Calcium 8.9 - 10.3 mg/dL 9.5 9.3 9.1 9.5 8.9 9.0 9.0  Total Protein 6.5 - 8.1 g/dL 7.0 6.5 6.6 6.8 6.2 Low  7.1 6.7  Albumin 3.5 - 5.0 g/dL 4.2 3.9 3.9 4.0 3.4 Low  3.9 3.9  AST 15 - 41 U/L ALT 0 - 44 U/L Alkaline Phosphatase 38 - 126 U/L 79 76 73 65 66 70 65  Total Bilirubin 0.3 - 1.2 mg/dL 0.5 0.6 0.8 0.6 0.4 0.8 0.6  GFR, Estimated >60 mL/min >60 >60 CM >60 CM >60 CM >60 CM >60 CM >60 CM  Comment: (NOTE) Calculated using the CKD-EPI Creatinine Equation (2021)  Anion gap 5 - CM 3 Low  CM 6 CM 9 CM 7 CM 6 CM  Comment: Performed at [facility], [address] [place], [place] [address]  Resulting Agency [facility] LAB [facility]         Specimen Collected: 01/31/23 14:22 Last Resulted: 01/31/23 14:47

## 2023-03-16 ENCOUNTER — Other Ambulatory Visit: Payer: Self-pay

## 2023-03-16 ENCOUNTER — Other Ambulatory Visit (HOSPITAL_COMMUNITY): Payer: Self-pay

## 2023-03-16 MED ORDER — CALQUENCE 100 MG PO TABS
ORAL_TABLET | ORAL | 2 refills | Status: DC
  Filled 2023-03-16: qty 24, 18d supply, fill #0
  Filled 2023-03-29: qty 40, 30d supply, fill #1
  Filled 2023-05-01: qty 40, 28d supply, fill #2

## 2023-03-19 ENCOUNTER — Other Ambulatory Visit: Payer: Self-pay

## 2023-03-19 ENCOUNTER — Ambulatory Visit (INDEPENDENT_AMBULATORY_CARE_PROVIDER_SITE_OTHER): Payer: Medicare PPO | Admitting: Dermatology

## 2023-03-19 VITALS — BP 122/76

## 2023-03-19 DIAGNOSIS — L853 Xerosis cutis: Secondary | ICD-10-CM | POA: Diagnosis not present

## 2023-03-19 DIAGNOSIS — L409 Psoriasis, unspecified: Secondary | ICD-10-CM | POA: Diagnosis not present

## 2023-03-19 MED ORDER — ZORYVE 0.3 % EX CREA: 1.0000 | TOPICAL_CREAM | Freq: Every day | CUTANEOUS | 6 refills | Status: AC

## 2023-03-19 NOTE — Progress Notes (Signed)
   Follow-Up Visit   Subjective  Andres Mullins is a 72 y.o. male who presents for the following: Psoriasis legs, 56m f/u, Zoryve cr qhs, not using Clobetasol cr. Pt has some joint pain.   The following portions of the chart were reviewed this encounter and updated as appropriate: medications, allergies, medical history  Review of Systems:  No other skin or systemic complaints except as noted in HPI or Assessment and Plan.  Objective  Well appearing patient in no apparent distress; mood and affect are within normal limits.  Areas Examined: Legs, arms  Relevant exam findings are noted in the Assessment and Plan.      Assessment & Plan     PSORIASIS Legs with hyperpigmented scaly patches, focal pink scaly paps, mod/severe xerosis, few scattered pink scaly paps arms 6% BSA.  Chronic and persistent condition with duration or expected duration over one year. Condition is symptomatic / bothersome to patient. Some improvement but not to goal.  Pt currently under treatment for CLL and needs to stay of topicals only, no biologics.  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 It is associated with an increased risk of inflammatory bowel disease, heart disease, non-alcoholic fatty liver disease, and depression.  Treatments include light and laser treatments; topical medications; and systemic medications including oral and injectables.  Topical steroids (such as triamcinolone, fluocinolone, fluocinonide, mometasone, clobetasol, halobetasol, betamethasone, hydrocortisone) can cause thinning  and lightening of the skin if they are used for too long in the same area. Your physician has selected the right strength medicine for your problem and area affected on the body. Please use your medication only as directed by your physician to prevent side effects.    Xerosis - diffuse xerotic patches - recommend gentle, hydrating skin care - gentle skin care handout given  - Recommend mild soap and moisturizer qd, samples of Amlactin given  Return in about 6 months (around 09/18/2023) for Psoriasis f/u, TBSE.  I, Ardis Rowan, RMA, am acting as scribe for Willeen Niece, MD .   Documentation: I have reviewed the above documentation for accuracy and completeness, and I agree with the above.  Willeen Niece, MD

## 2023-03-19 NOTE — Patient Instructions (Addendum)
Recommend starting moisturizer with exfoliant (Urea, Salicylic acid, or Lactic acid) one to two times daily to help smooth rough and bumpy skin.  OTC options include Cetaphil Rough and Bumpy lotion (Urea), Eucerin Roughness Relief lotion or spot treatment cream (Urea), CeraVe SA lotion/cream for Rough and Bumpy skin (Sal Acid), Gold Bond Rough and Bumpy cream (Sal Acid), and AmLactin 12% lotion/cream (Lactic Acid).  If applying in morning, also apply sunscreen to sun-exposed areas, since these exfoliating moisturizers can increase sensitivity to sun.   Due to recent changes in healthcare laws, you may see results of your pathology and/or laboratory studies on MyChart before the doctors have had a chance to review them. We understand that in some cases there may be results that are confusing or concerning to you. Please understand that not all results are received at the same time and often the doctors may need to interpret multiple results in order to provide you with the best plan of care or course of treatment. Therefore, we ask that you please give us 2 business days to thoroughly review all your results before contacting the office for clarification. Should we see a critical lab result, you will be contacted sooner.   If You Need Anything After Your Visit  If you have any questions or concerns for your doctor, please call our main line at [phone] and press option 4 to reach your doctor's medical assistant. If no one answers, please leave a voicemail as directed and we will return your call as soon as possible. Messages left after 4 pm will be answered the following business day.   You may also send us a message via MyChart. We typically respond to MyChart messages within 1-2 business days.  For prescription refills, please ask your pharmacy to contact our office. Our fax number is [phone].  If you have an urgent issue when the clinic is closed that cannot wait until the next business day,  you can page your doctor at the number below.    Please note that while we do our best to be available for urgent issues outside of office hours, we are not available 24/7.   If you have an urgent issue and are unable to reach us, you may choose to seek medical care at your doctor's office, retail clinic, urgent care center, or emergency room.  If you have a medical emergency, please immediately call 911 or go to the emergency department.  Pager Numbers  - Dr. Kowalski: [phone]  - Dr. Moye: [phone]  - Dr. Stewart: [phone]  In the event of inclement weather, please call our main line at [phone] for an update on the status of any delays or closures.  Dermatology Medication Tips: Please keep the boxes that topical medications come in in order to help keep track of the instructions about where and how to use these. Pharmacies typically print the medication instructions only on the boxes and not directly on the medication tubes.   If your medication is too expensive, please contact our office at [phone] option 4 or send us a message through MyChart.   We are unable to tell what your co-pay for medications will be in advance as this is different depending on your insurance coverage. However, we may be able to find a substitute medication at lower cost or fill out paperwork to get insurance to cover a needed medication.   If a prior authorization is required to get your medication covered by your insurance company, please allow   us 1-2 business days to complete this process.  Drug prices often vary depending on where the prescription is filled and some pharmacies may offer cheaper prices.  The website www.goodrx.com contains coupons for medications through different pharmacies. The prices here do not account for what the cost may be with help from insurance (it may be cheaper with your insurance), but the website can give you the price if you did not use any insurance.   - You can print the associated coupon and take it with your prescription to the pharmacy.  - You may also stop by our office during regular business hours and pick up a GoodRx coupon card.  - If you need your prescription sent electronically to a different pharmacy, notify our office through [facility] MyChart or by phone at [phone] option 4.     Si Usted Necesita Algo Despus de Su Visita  Tambin puede enviarnos un mensaje a travs de MyChart. Por lo general respondemos a los mensajes de MyChart en el transcurso de 1 a 2 das hbiles.  Para renovar recetas, por favor pida a su farmacia que se ponga en contacto con nuestra oficina. Nuestro nmero de fax es el [phone].  Si tiene un asunto urgente cuando la clnica est cerrada y que no puede esperar hasta el siguiente da hbil, puede llamar/localizar a su doctor(a) al nmero que aparece a continuacin.   Por favor, tenga en cuenta que aunque hacemos todo lo posible para estar disponibles para asuntos urgentes fuera del horario de oficina, no estamos disponibles las 24 horas del da, los 7 das de la semana.   Si tiene un problema urgente y no puede comunicarse con nosotros, puede optar por buscar atencin mdica  en el consultorio de su doctor(a), en una clnica privada, en un centro de atencin urgente o en una sala de emergencias.  Si tiene una emergencia mdica, por favor llame inmediatamente al 911 o vaya a la sala de emergencias.  Nmeros de bper  - Dr. Kowalski: [phone]  - Dra. Moye: [phone]  - Dra. Stewart: [phone]  En caso de inclemencias del tiempo, por favor llame a nuestra lnea principal al [phone] para una actualizacin sobre el estado de cualquier retraso o cierre.  Consejos para la medicacin en dermatologa: Por favor, guarde las cajas en las que vienen los medicamentos de uso tpico para ayudarle a seguir las instrucciones sobre dnde y cmo usarlos. Las farmacias generalmente  imprimen las instrucciones del medicamento slo en las cajas y no directamente en los tubos del medicamento.   Si su medicamento es muy caro, por favor, pngase en contacto con nuestra oficina llamando al [phone] y presione la opcin 4 o envenos un mensaje a travs de MyChart.   No podemos decirle cul ser su copago por los medicamentos por adelantado ya que esto es diferente dependiendo de la cobertura de su seguro. Sin embargo, es posible que podamos encontrar un medicamento sustituto a menor costo o llenar un formulario para que el seguro cubra el medicamento que se considera necesario.   Si se requiere una autorizacin previa para que su compaa de seguros cubra su medicamento, por favor permtanos de 1 a 2 das hbiles para completar este proceso.  Los precios de los medicamentos varan con frecuencia dependiendo del lugar de dnde se surte la receta y alguna farmacias pueden ofrecer precios ms baratos.  El sitio web www.goodrx.com tiene cupones para medicamentos de diferentes farmacias. Los precios aqu no tienen en cuenta lo que podra   costar con la ayuda del seguro (puede ser ms barato con su seguro), pero el sitio web puede darle el precio si no utiliz ningn seguro.  - Puede imprimir el cupn correspondiente y llevarlo con su receta a la farmacia.  - Tambin puede pasar por nuestra oficina durante el horario de atencin regular y recoger una tarjeta de cupones de GoodRx.  - Si necesita que su receta se enve electrnicamente a una farmacia diferente, informe a nuestra oficina a travs de MyChart de [place] o por telfono llamando al [phone] y presione la opcin 4.  

## 2023-03-20 ENCOUNTER — Other Ambulatory Visit: Payer: Self-pay

## 2023-03-27 DIAGNOSIS — M65341 Trigger finger, right ring finger: Secondary | ICD-10-CM | POA: Diagnosis not present

## 2023-03-29 ENCOUNTER — Other Ambulatory Visit (HOSPITAL_COMMUNITY): Payer: Self-pay

## 2023-04-10 ENCOUNTER — Other Ambulatory Visit (HOSPITAL_COMMUNITY): Payer: Self-pay

## 2023-04-18 ENCOUNTER — Other Ambulatory Visit (HOSPITAL_COMMUNITY): Payer: Self-pay

## 2023-04-24 DIAGNOSIS — M65341 Trigger finger, right ring finger: Secondary | ICD-10-CM | POA: Diagnosis not present

## 2023-04-24 NOTE — Progress Notes (Signed)
Established Patient Office Visit  Subjective   Patient ID: Andres Mullins, male    DOB: June 28, 1951  Age: 72 y.o. MRN: [id number]  No chief complaint on file.   HPI  Patient here for follow up on chronic medical conditions.  CLL: -First diagnosed 12/2019. Following with Dr. Donneta Romberg, Oncology. Last note from 01/31/23 reviewed.  -Currently on Acalabrutinib 100 mg BID since May 2023. Now getting a medication holiday with the PM dose a few days a week -Had been on Venatolax but this was discontinued due to multiple hospitalizations/pericarditis/pleural effusions  -No longer on Acyclovir, did have first dose of Shingles vaccine -Finished 6 rounds of infusions chemo - port removed and doing well overall. 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  Health Maintenance: -Blood work up to date   Patient Active Problem List   Diagnosis Date Noted   Snoring 01/31/2023   Nicotine dependence 01/31/2023   Pleural effusion due to CHF (congestive heart failure) (HCC) 01/17/2022   Fever with chills 01/17/2022   Acute diastolic CHF (congestive heart failure) (HCC) 01/16/2022   History of pericarditis 01/16/2022   Tobacco use disorder, continuous 12/19/2021   Pulmonary embolism (HCC) 10/16/2021   Sepsis due to pneumonia (HCC) 10/03/2021   Pericardial effusion 10/03/2021   Oropharyngeal candidiasis 10/03/2021   Hyponatremia 10/03/2021   Acute hypoxemic respiratory failure (HCC) 10/03/2021   Cardiac tamponade 10/03/2021   CLL (chronic lymphocytic leukemia) (HCC) 10/29/2019   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    Welcome to Medicare preventive visit 11/15/2018   Renal lesion 11/12/2018   Prostate enlargement 11/12/2018   Acquired trigger finger 11/12/2018   Trigger middle finger of right hand 01/02/2018   Positive anti-CCP test 12/12/2017   Rheumatoid factor positive 12/12/2017   Trigger finger of both hands 12/12/2017   Bilateral hand pain 05/18/2017   Preventative health care 05/18/2017   Screen for colon cancer 05/18/2017   Coronary artery calcification 01/18/2017   Supraspinatus tendinitis, left 10/22/2016   Impingement syndrome of shoulder region 10/06/2016   Celiac disease 10/05/2016   Atherosclerosis of coronary artery 09/28/2016   Thoracic aortic atherosclerosis (HCC) 09/28/2016   Personal history of tobacco use, presenting hazards to health 09/26/2016   Lipoma of head 10/19/2015   Tendonitis, Achilles, left 10/14/2015   Herpes    Rosacea    Psoriasis    Hyperlipidemia    Vitamin D deficiency disease    Tobacco abuse    Elevated PSA    Elevated C-reactive protein (CRP)    Past Medical History:  Diagnosis Date   Atherosclerosis of coronary artery 09/28/2016   Chest CT Nov 2017   Atrial tachycardia    CLL (chronic lymphocytic leukemia) (HCC)    Elevated C-reactive protein (CRP)    Elevated PSA    Fractured sternum 1999   Herpes    History of rib fracture 1999   multiple   Hyperlipidemia    Psoriasis    Pulmonary embolism (HCC)    Rheumatoid factor positive 02/2014   Rosacea    Thoracic aortic atherosclerosis (HCC) 09/28/2016   Chest CT Nov  2017   Tobacco abuse    Vitamin D deficiency disease    Past Surgical History:  Procedure Laterality Date   APPENDECTOMY  1974   IR REMOVAL TUN ACCESS W/ PORT W/O FL MOD SED  03/20/2022   PERICARDIOCENTESIS N/A 10/03/2021   Procedure: PERICARDIOCENTESIS;  Surgeon: Corky Crafts, MD;  Location: [facility] INVASIVE CV LAB;  Service: Cardiovascular;  Laterality: N/A;   PORTA CATH INSERTION N/A 06/06/2021   Procedure: PORTA CATH  INSERTION;  Surgeon: Annice Needy, MD;  Location: [facility] INVASIVE CV LAB;  Service: Cardiovascular;  Laterality: N/A;   SHOULDER ARTHROSCOPY  10/23/2016   Procedure: ARTHROSCOPY SHOULDER WITH DISTAL CLAVICLE EXCISION, PARTIAL ACROMIONECTOMY, AND DEBRIDEMENT;  Surgeon: Deeann Saint, MD;  Location: [facility] ORS;  Service: Orthopedics;;   TIBIA FRACTURE SURGERY Left 2004   titanium rod, ORIF   Social History   Tobacco Use   Smoking status: Every Day    Packs/day: 0.50    Years: 45.00    Additional pack years: 0.00    Total pack years: 22.50    Types: Cigarettes   Smokeless tobacco: Never   Tobacco comments:    11/6- currently smoking 5 cigarettes a day   Vaping Use   Vaping Use: Never used  Substance Use Topics   Alcohol use: Yes    Alcohol/week: 3.0 standard drinks of alcohol    Types: 3 Glasses of wine per week    Comment: a couple glasses of wine a week   Drug use: No   Social History   Socioeconomic History   Marital status: Married    Spouse name: colleen   Number of children: Not on file   Years of education: 12   Highest education level: Bachelor's degree (e.g., BA, AB, BS)  Occupational History   Occupation: retired  Tobacco Use   Smoking status: Every Day    Packs/day: 0.50    Years: 45.00    Additional pack years: 0.00    Total pack years: 22.50    Types: Cigarettes   Smokeless tobacco: Never   Tobacco comments:    11/6- currently smoking 5 cigarettes a day   Vaping Use   Vaping Use: Never used  Substance and Sexual Activity   Alcohol use: Yes    Alcohol/week: 3.0 standard drinks of alcohol    Types: 3 Glasses of wine per week    Comment: a couple glasses of wine a week   Drug use: No   Sexual activity: Yes  Other Topics Concern   Not on file  Social History Narrative   Consumes ~4 cups of coffee/day   Social Determinants of Health   Financial Resource Strain: Low Risk  (04/21/2023)   Overall Financial Resource Strain (CARDIA)    Difficulty of Paying  Living Expenses: Not hard at all  Food Insecurity: No Food Insecurity (04/21/2023)   Hunger Vital Sign    Worried About Running Out of Food in the Last Year: Never true    Ran Out of Food in the Last Year: Never true  Transportation Needs: No Transportation Needs (04/21/2023)   PRAPARE - Administrator, Civil Service (Medical): No    Lack of Transportation (Non-Medical): No  Physical Activity: Insufficiently Active (04/21/2023)   Exercise Vital Sign    Days of Exercise per Week: 3 days    Minutes of Exercise per Session: 30 min  Stress: No Stress Concern Present (04/21/2023)   [employer] of Occupational Health - Occupational Stress Questionnaire  Feeling of Stress : Not at all  Social Connections: Moderately Isolated (04/21/2023)   Social Connection and Isolation Panel [NHANES]    Frequency of Communication with Friends and Family: Three times a week    Frequency of Social Gatherings with Friends and Family: Once a week    Attends Religious Services: Never    Database administrator or Organizations: No    Attends Engineer, structural: Not on file    Marital Status: Married  Catering manager Violence: Not At Risk (12/11/2019)   Humiliation, Afraid, Rape, and Kick questionnaire    Fear of Current or Ex-Partner: No    Emotionally Abused: No    Physically Abused: No    Sexually Abused: No   Family Status  Relation Name Status   Father  Alive   Mother  Other       unsure   Sister  Alive   Brother  Alive   MGM unknown Other   MGF unknown Other   PGM  Deceased       natural cause   PGF  Deceased       natural cause   Neg Hx  (Not Specified)   Family History  Problem Relation Age of Onset   Heart disease Father    Osteoarthritis Father    Alzheimer's disease Father    Cancer Neg Hx    Diabetes Neg Hx    Hypertension Neg Hx    Stroke Neg Hx    COPD Neg Hx    Allergies  Allergen Reactions   Gluten Meal     Gluten intolerance       Review  of Systems  Constitutional:  Negative for chills and fever.  Respiratory:  Negative for cough and shortness of breath.   Cardiovascular:  Negative for chest pain.  Neurological:  Negative for weakness.      Objective:     There were no vitals taken for this visit. BP Readings from Last 3 Encounters:  03/19/23 122/76  01/31/23 100/63  11/02/22 122/76   Wt Readings from Last 3 Encounters:  11/02/22 171 lb 6.4 oz (77.7 kg)  11/01/22 170 lb 9.6 oz (77.4 kg)  10/12/22 170 lb (77.1 kg)      Physical Exam Constitutional:      Appearance: Normal appearance.  HENT:     Head: Normocephalic and atraumatic.  Eyes:     Conjunctiva/sclera: Conjunctivae normal.  Cardiovascular:     Rate and Rhythm: Normal rate and regular rhythm.  Pulmonary:     Effort: Pulmonary effort is normal.     Breath sounds: Normal breath sounds.  Skin:    General: Skin is warm and dry.  Neurological:     General: No focal deficit present.     Mental Status: He is alert. Mental status is at baseline.  Psychiatric:        Mood and Affect: Mood normal.        Behavior: Behavior normal.     No results found for any visits on 04/25/23.  Last CBC Lab Results  Component Value Date   WBC 11.3 (H) 01/31/2023   HGB 16.4 01/31/2023   HCT 47.8 01/31/2023   MCV 90.7 01/31/2023   MCH 31.1 01/31/2023   RDW 13.5 01/31/2023   PLT 277 01/31/2023   Last metabolic panel Lab Results  Component Value Date   GLUCOSE 62 (L) 01/31/2023   NA 138 01/31/2023   K 4.0 01/31/2023   CL 104 01/31/2023  CO2 26 01/31/2023   BUN 10 01/31/2023   CREATININE 0.90 01/31/2023   GFRNONAA >60 01/31/2023   CALCIUM 9.5 01/31/2023   PHOS 3.5 12/27/2021   PROT 7.0 01/31/2023   ALBUMIN 4.2 01/31/2023   BILITOT 0.5 01/31/2023   ALKPHOS 79 01/31/2023   AST 20 01/31/2023   ALT 25 01/31/2023   ANIONGAP 8 01/31/2023   Last lipids Lab Results  Component Value Date   CHOL 194 10/06/2019   HDL 45 10/06/2019   LDLCALC 124 (H)  10/06/2019   TRIG 136 10/06/2019   CHOLHDL 4.3 10/06/2019   Last hemoglobin A1c Lab Results  Component Value Date   HGBA1C 5.5 09/07/2021   Last thyroid functions Lab Results  Component Value Date   TSH 1.237 10/04/2021   Last vitamin D Lab Results  Component Value Date   VD25OH 27 (L) 12/19/2021   Last vitamin B12 and Folate No results found for: "VITAMINB12", "FOLATE"    The ASCVD Risk score (Arnett DK, et al., 2019) failed to calculate for the following reasons:   Cannot find a previous HDL lab   Cannot find a previous total cholesterol lab    Assessment & Plan:   1. CLL (chronic lymphocytic leukemia) (HCC): Stable, note from Oncology reviewed from 11/01/22. Labs from that date reviewed as well.   2. History of pulmonary embolus (PE): No longer on anticoagulation therapy, denies shortness of breath, legs swelling. Continue to monitor.   3. Psoriasis: Stable, following with Dermatology, currently on  Zoryve and Clobestol PRN.    No follow-ups on file.    Margarita Mail, DO

## 2023-04-25 ENCOUNTER — Encounter: Payer: Self-pay | Admitting: Internal Medicine

## 2023-04-25 ENCOUNTER — Ambulatory Visit (INDEPENDENT_AMBULATORY_CARE_PROVIDER_SITE_OTHER): Payer: Medicare PPO | Admitting: Internal Medicine

## 2023-04-25 VITALS — BP 124/74 | HR 86 | Temp 97.7°F | Resp 18 | Ht 66.0 in | Wt 167.8 lb

## 2023-04-25 DIAGNOSIS — E785 Hyperlipidemia, unspecified: Secondary | ICD-10-CM

## 2023-04-25 DIAGNOSIS — E559 Vitamin D deficiency, unspecified: Secondary | ICD-10-CM

## 2023-04-25 DIAGNOSIS — R7309 Other abnormal glucose: Secondary | ICD-10-CM

## 2023-04-25 DIAGNOSIS — Z125 Encounter for screening for malignant neoplasm of prostate: Secondary | ICD-10-CM | POA: Diagnosis not present

## 2023-04-25 DIAGNOSIS — C9110 Chronic lymphocytic leukemia of B-cell type not having achieved remission: Secondary | ICD-10-CM

## 2023-04-25 DIAGNOSIS — R972 Elevated prostate specific antigen [PSA]: Secondary | ICD-10-CM | POA: Diagnosis not present

## 2023-04-26 LAB — LIPID PANEL
Cholesterol: 201 mg/dL — ABNORMAL HIGH (ref <200)
HDL: 56 mg/dL (ref >=40)
LDL Cholesterol (Calc): 126 mg/dL (calc) — ABNORMAL HIGH
Non-HDL Cholesterol (Calc): 145 mg/dL (calc) — ABNORMAL HIGH (ref <130)
Total CHOL/HDL Ratio: 3.6 (calc) (ref <5.0)
Triglycerides: 88 mg/dL (ref <150)

## 2023-04-26 LAB — VITAMIN D 25 HYDROXY (VIT D DEFICIENCY, FRACTURES): Vit D, 25-Hydroxy: 49 ng/mL (ref 30–100)

## 2023-04-26 LAB — PSA: PSA: 7.05 ng/mL — ABNORMAL HIGH (ref <=4.00)

## 2023-04-26 LAB — HEMOGLOBIN A1C
Hgb A1c MFr Bld: 5.5 % of total Hgb (ref <5.7)
Mean Plasma Glucose: 111 mg/dL
eAG (mmol/L): 6.2 mmol/L

## 2023-04-26 NOTE — Addendum Note (Signed)
Addended by: Margarita Mail on: 04/26/2023 07:55 AM   Modules accepted: Orders

## 2023-04-27 HISTORY — PX: TRIGGER FINGER RELEASE: SHX641

## 2023-05-01 ENCOUNTER — Other Ambulatory Visit (HOSPITAL_COMMUNITY): Payer: Self-pay

## 2023-05-02 ENCOUNTER — Other Ambulatory Visit (HOSPITAL_COMMUNITY): Payer: Self-pay

## 2023-05-02 IMAGING — CT NM PET TUM IMG RESTAG (PS) SKULL BASE T - THIGH
1 of 9 series · 2 of 25 positions shown · non-contrast
Comparison: 10/16/2021 PET. Abdominal CT of 11/25/2019. Lung cancer
screening CT 10/14/2020.

CLINICAL DATA: Subsequent treatment strategy for chronic
lymphocytic leukemia. Restaging. COVID vaccine in right arm 2 months
ago. No recent chemotherapy or radiation therapy.

EXAM:
NUCLEAR MEDICINE PET SKULL BASE TO THIGH
TECHNIQUE: 9.0 mCi F-18 FDG was injected intravenously. Full-ring PET imaging
was performed from the skull base to thigh after the radiotracer. CT
data was obtained and used for attenuation correction and anatomic
localization.
Fasting blood glucose: 92 mg/dl

[Series 3: ct wb 5.0 b30f · axial · 5.0mm · 0.98mm/px · z∈[+210,+645]mm · 2 of 290 slices shown]
[im 145/290  brain]
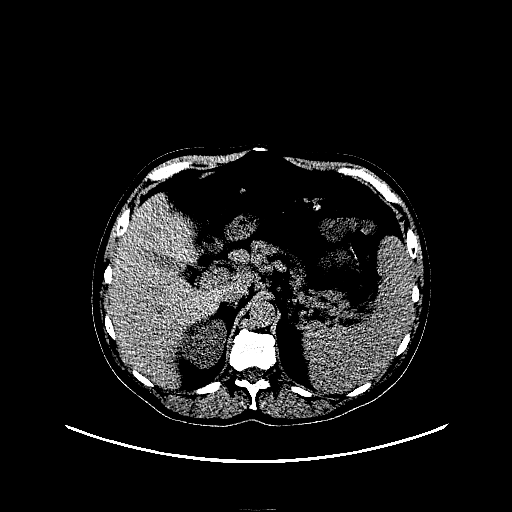
[im 290/290  brain]
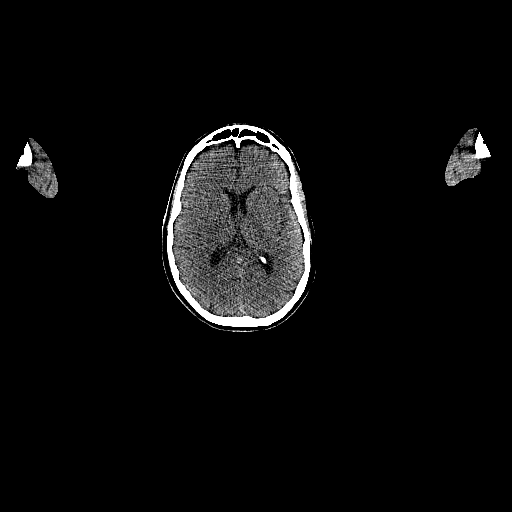

[2 of 25 positions shown; findings below may reference images not displayed]

FINDINGS: Mediastinal blood pool activity: SUV max

Liver activity: SUV max

NECK: Left posterior ethmoid air cell soft tissue density and
hypermetabolism including at a S.U.V. max of 13.9 on [DATE]. Decreased
from a S.U.V. max of 18.9 on the prior exam. Likely inflammatory.

Significantly progressive hypermetabolic adenopathy since the prior
PET. Conglomerate of left low jugular/supraclavicular nodes,
including at maximally 4.9 x 3.4 cm and a S.U.V. max of 5.4. On the
prior PET, nodes measured up to 1.5 cm and a S.U.V. max of 3.5 (when
remeasured). Significant progression in adenopathy since the neck CT
of 01/01/2020, where this index node measured 1.2 cm (when
remeasured).

Incidental CT findings: none

CHEST: Massive bilateral axillary hypermetabolic adenopathy. Index
left axillary node measures 3.6 cm and a S.U.V. max of 5.2 today
versus 1.2 cm and a S.U.V. max of 2.8 on the prior PET.

A prevascular mildly hypermetabolic node measures 1.3 cm and a
S.U.V. max of 3.7 on 87/3. New.

Incidental CT findings: Lad coronary artery calcification. Tortuous
thoracic aorta. Mild centrilobular and paraseptal emphysema. Tiny
hiatal hernia.

ABDOMEN/PELVIS: Left abdominal periaortic index nodal mass measures
3.9 x 4.5 cm and a S.U.V. max of 5.4 on 166/3. Nodes in this region
measured up to 1.3 cm and a S.U.V. max of 2.7 on the prior exam.

Hypermetabolic small bowel mesenteric nodes are new.

Index right external iliac nodal mass measures 5.1 x 7.2 cm and a
S.U.V. max of 5.0 on 223/3. Nodes in this region measured up to
cm and a S.U.V. max of 3.6 on the prior exam.

Incidental CT findings: Abdominal aortic atherosclerosis. Mild
prostatomegaly.

SKELETON: No abnormal marrow activity.

Incidental CT findings: none
IMPRESSION: 1. Marked progression of lymphoproliferative process since the prior
PET of 10/17/2019 and the neck CT of 01/01/2020. ([HOSPITAL]) 5
2. Incidental findings, including: Coronary artery atherosclerosis.
Emphysema ([id number]). Tiny hiatal hernia.

## 2023-05-03 ENCOUNTER — Ambulatory Visit
Admission: RE | Admit: 2023-05-03 | Discharge: 2023-05-03 | Disposition: A | Discharge status: Home / Self Care | Payer: Medicare PPO | Source: Ambulatory Visit | Attending: Internal Medicine | Admitting: Internal Medicine

## 2023-05-03 DIAGNOSIS — C9110 Chronic lymphocytic leukemia of B-cell type not having achieved remission: Secondary | ICD-10-CM | POA: Diagnosis not present

## 2023-05-03 MED ORDER — IOHEXOL 300 MG/ML  SOLN
100.0000 mL | Freq: Once | INTRAMUSCULAR | Status: AC | PRN
  Administered 2023-05-03: 100 mL via INTRAVENOUS

## 2023-05-08 ENCOUNTER — Encounter: Payer: Self-pay | Admitting: Internal Medicine

## 2023-05-08 ENCOUNTER — Inpatient Hospital Stay (HOSPITAL_BASED_OUTPATIENT_CLINIC_OR_DEPARTMENT_OTHER): Payer: Medicare PPO | Admitting: Internal Medicine

## 2023-05-08 ENCOUNTER — Inpatient Hospital Stay: Payer: Medicare PPO | Attending: Internal Medicine

## 2023-05-08 VITALS — BP 114/82 | HR 82 | Temp 96.1°F | Ht 66.0 in | Wt 166.2 lb

## 2023-05-08 DIAGNOSIS — Z86711 Personal history of pulmonary embolism: Secondary | ICD-10-CM | POA: Diagnosis not present

## 2023-05-08 DIAGNOSIS — Z8249 Family history of ischemic heart disease and other diseases of the circulatory system: Secondary | ICD-10-CM | POA: Diagnosis not present

## 2023-05-08 DIAGNOSIS — E785 Hyperlipidemia, unspecified: Secondary | ICD-10-CM | POA: Insufficient documentation

## 2023-05-08 DIAGNOSIS — Z9049 Acquired absence of other specified parts of digestive tract: Secondary | ICD-10-CM | POA: Diagnosis not present

## 2023-05-08 DIAGNOSIS — Z818 Family history of other mental and behavioral disorders: Secondary | ICD-10-CM | POA: Insufficient documentation

## 2023-05-08 DIAGNOSIS — Z79899 Other long term (current) drug therapy: Secondary | ICD-10-CM | POA: Insufficient documentation

## 2023-05-08 DIAGNOSIS — J449 Chronic obstructive pulmonary disease, unspecified: Secondary | ICD-10-CM | POA: Insufficient documentation

## 2023-05-08 DIAGNOSIS — M2550 Pain in unspecified joint: Secondary | ICD-10-CM | POA: Diagnosis not present

## 2023-05-08 DIAGNOSIS — C9110 Chronic lymphocytic leukemia of B-cell type not having achieved remission: Secondary | ICD-10-CM

## 2023-05-08 DIAGNOSIS — I2510 Atherosclerotic heart disease of native coronary artery without angina pectoris: Secondary | ICD-10-CM | POA: Insufficient documentation

## 2023-05-08 DIAGNOSIS — Z8261 Family history of arthritis: Secondary | ICD-10-CM | POA: Insufficient documentation

## 2023-05-08 DIAGNOSIS — M791 Myalgia, unspecified site: Secondary | ICD-10-CM | POA: Diagnosis not present

## 2023-05-08 DIAGNOSIS — F17210 Nicotine dependence, cigarettes, uncomplicated: Secondary | ICD-10-CM | POA: Diagnosis not present

## 2023-05-08 DIAGNOSIS — N400 Enlarged prostate without lower urinary tract symptoms: Secondary | ICD-10-CM | POA: Insufficient documentation

## 2023-05-08 LAB — CBC WITH DIFFERENTIAL (CANCER CENTER ONLY)
Abs Immature Granulocytes: 0.05 10*3/uL (ref 0.00–0.07)
Basophils Absolute: 0.1 10*3/uL (ref 0.0–0.1)
Basophils Relative: 1 %
Eosinophils Absolute: 0.3 10*3/uL (ref 0.0–0.5)
Eosinophils Relative: 2 %
HCT: 45.6 % (ref 39.0–52.0)
Hemoglobin: 15.9 g/dL (ref 13.0–17.0)
Immature Granulocytes: 0 %
Lymphocytes Relative: 16 %
Lymphs Abs: 1.9 10*3/uL (ref 0.7–4.0)
MCH: 31.4 pg (ref 26.0–34.0)
MCHC: 34.9 g/dL (ref 30.0–36.0)
MCV: 89.9 fL (ref 80.0–100.0)
Monocytes Absolute: 0.8 10*3/uL (ref 0.1–1.0)
Monocytes Relative: 6 %
Neutro Abs: 8.9 10*3/uL — ABNORMAL HIGH (ref 1.7–7.7)
Neutrophils Relative %: 75 %
Platelet Count: 242 10*3/uL (ref 150–400)
RBC: 5.07 MIL/uL (ref 4.22–5.81)
RDW: 13.3 % (ref 11.5–15.5)
WBC Count: 12 10*3/uL — ABNORMAL HIGH (ref 4.0–10.5)
nRBC: 0 % (ref 0.0–0.2)

## 2023-05-08 LAB — LACTATE DEHYDROGENASE: LDH: 109 U/L (ref 98–192)

## 2023-05-08 LAB — CMP (CANCER CENTER ONLY)
ALT: 22 U/L (ref 0–44)
AST: 18 U/L (ref 15–41)
Albumin: 4 g/dL (ref 3.5–5.0)
Alkaline Phosphatase: 70 U/L (ref 38–126)
Anion gap: 9 (ref 5–15)
BUN: 9 mg/dL (ref 8–23)
CO2: 23 mmol/L (ref 22–32)
Calcium: 9.3 mg/dL (ref 8.9–10.3)
Chloride: 104 mmol/L (ref 98–111)
Creatinine: 0.93 mg/dL (ref 0.61–1.24)
GFR, Estimated: >60 mL/min (ref >60)
Glucose, Bld: 61 mg/dL — ABNORMAL LOW (ref 70–99)
Potassium: 4.1 mmol/L (ref 3.5–5.1)
Sodium: 136 mmol/L (ref 135–145)
Total Bilirubin: 0.5 mg/dL (ref 0.3–1.2)
Total Protein: 6.4 g/dL — ABNORMAL LOW (ref 6.5–8.1)

## 2023-05-08 LAB — CK: Total CK: 52 U/L (ref 49–397)

## 2023-05-08 LAB — MAGNESIUM: Magnesium: 2 mg/dL (ref 1.7–2.4)

## 2023-05-08 LAB — URIC ACID: Uric Acid, Serum: 5.5 mg/dL (ref 3.7–8.6)

## 2023-05-08 NOTE — Progress Notes (Signed)
Review CT scan and labs.

## 2023-05-08 NOTE — Assessment & Plan Note (Addendum)
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 Continue acalbrutinib, but SKIP the PM dose Tuesday;Thursday;Sat & Sun.  # Clinically, no evidence of any progression.  Labs within normal limits. JUNE 8th, 2024-  CT CAP- No pathologically enlarged lymph nodes within the chest, abdomen or pelvis and no splenomegaly. stable.   # Myalgias/Fatigue/ Insomnia: ? Related Acalabrutinib- Continue acalbrutinib, but SKIP the PM dose 4 days. s/p Exercise/CARE program. stable.   # Psoriasis/? Arthritis- relative contraindication with TNF-Alpha, but not absolute- on topical zorvye- stable.   # Active smoker: recommend quitting smoking; cutting down-   # Enlarged prostate- PSA 7.0 [MAY 2024-]- awaiting urology evaluation [Dr. Sninski] this week.   # Secondary malignancies: dermatology [Dr.Stewart ]  # IV access: PIV  # Vaccines: ok with shingrix # [first in April, 2023];  S/p-flu/RSV/Pneumoanis vaccines; s/p covid.   #Incidental findings on Imaging  CT ,JUNE 2024: 2. Hepatic steatosis; Pancolonic diverticulosis without findings of acute diverticulitis; Aortic Atherosclerosis and Emphysema .I reviewed/discussed/counseled the patient.  # DISPOSITION: # follow up in 3  months MD; labs- cbc/cmp; URIC ACID; LDH;mag;  Dr.B  # I reviewed the blood work- with the patient in detail; also reviewed the imaging independently [as summarized above]; and with the patient in detail.

## 2023-05-08 NOTE — Progress Notes (Signed)
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   AUG 2021-Head and neck: The brain demonstrates symmetric appearing diffuse physiologic uptake without focal finding to suggest abnormality. In the left posterior and medial maxillary cavity, there is an area of mucoid deposition seen within SUV max of 28.8. This was seen in the previous study of November. The specific bony erosion, clear-cut soft tissue lesion is not detected. This probably represents a chronic sinus disease focus. The bilateral optic nerves and extraocular muscles to the largest nodes are seen on axial image 98. 1 now demonstrates a short axis measurement of 3.3 cm and the other more medially, a short axis measurement of 2.5 cm. Demonstrate symmetric increased uptake. The left, much greater than right, jugulodigastric and supraclavicular nodes have increased significantly in size and avidity since the previous study. Both of these lymph nodes demonstrate an SUV max of 3.9. No largest lymph node in the right subclavicular space is seen on axial image 104, demonstrating an SUV max of 3.3, and a short axis measurement of 1.4 cm.   On axial image 102, a right  paratracheal lymph node is again identified with increased avidity, located immediately posterior to the right thyroid pole. This area was noted previously, but now increased avidity is seen, with an SUV max of 8.6 in this area. Multiple smaller, and less avid lymph nodes scattered throughout the left greater than right neck are seen, as well as supraclavicular and infraclavicular areas.   Thorax: Significant axillary lymph nodes have increased significantly in size. These are also left greater than right, with the largest on the left seen on image #114 with a short axis measurement of 2.2 cm and an SUV max of 4.3. The largest on the right as seen on image 111, demonstrating a short axis of 1.4 cm and an SUV max of 4.0. Too numerous to count smaller and similarly avid lymph nodes in both axillae are also noted. These are all larger than previously seen and in general, more avid.  Numerous small AP window and lesser mediastinal lymph nodes are seen with minimal increased avidity, appearing similar in size and avidity to the previous study. No other significant mediastinal adenopathy. No hilar adenopathy detected. These likely also represent lymphoproliferative disease.  Hyperinflation of COPD, bibasilar dependent atelectasis, some paucity of lung tissue and biapical scarring are noted without other focal pulmonary mass or nodule, or acute pulmonary parenchymal disease. Physiologic uptake in the left heart is seen, as expected. Small hiatal hernia is again identified.   Abdomen pelvis: Physiologic uptake in the liver, gut and urinary tract is again noted. No focal area of asymmetric uptake in the structures is seen. The spleen also demonstrates generalized increased uptake and stable splenomegaly, up to 14.2 cm. On image 198, a preaortic lymph node is detected of  increased size since the previous study, now measuring up to 1.2 cm short axis.  Several large lymph nodes on image 203 of the mesentery are noted.  The largest of these, on image 201 and ureters up to 1.8 cm short axis. Despite the fact that measurable avidity is not detected in these are the surrounding lymph nodes, these are doubtless involved in the lymphoproliferative disease described, and appear larger. Multiple smaller lymph nodes scattered throughout the mesentery are also seen.  Grossly enlarged right greater than left common iliac chain lymph nodes are again seen, throughout the pelvis and into the inguinal areas bilaterally. The most avid on the right is seen on image #260 to, demonstrates a short axis of 2.5 cm, and an SUV max of 4.9. The largest on the left is identified image #272, demonstrates a short axis of 2.0 cm, and an SUV max of 4.7. These are similar to the previously noted values.  Right greater than left inguinal adenopathy is seen, with the largest lymph node seen on the right image 276, demonstrating a short axis of 1.5 cm and an SUV max of 3.9. This is larger in size and avidity than previously seen. There are bilateral enlarged lymph nodes associated with the external iliac vessels on image 277. These measure 2.0 cm on the right in short axis, with an SUV max of 3.9.  On the left, image 278, the external iliac lymph node is larger at 1.8 cm short axis, but with an SUV max of only 3.8. Lesser avid and smaller lymph nodes on both sides are also detected.   Musculoskeletal: Scattered, insignificant uptake is detected in the muscles especially in the bilateral joints, without focal finding to suggest lymphoproliferative disease.   IMPRESSIONS:  Significant increase in numbers, short axis size, and to a large extent, avidity -in the lymph nodes of the left greater than right neck, supraclavicular and infraclavicular areas, bilateral axillary lymph nodes and common iliac chain, external iliac chain and inguinal lymph nodes, consistent with worsening lymphoproliferative disease. The mediastinal lymph nodes seen are still present and  unchanged in size or [significantly] in avidity.    [place]- Bx- SLL/CLL.July 2022-Discussed with Dr. Asquith-oncologist, [place]-reviewed the pathology of the lymph node biopsy in 2021-positive for SLL; negative for cyclin D1/1114 translocation.    # July 20th, 2022- GAZYVA #1 Stann [place] plan ading VENATOCLAX after 2 cycles/de-bulking] s/p GAYZYVA [x6]- Feb 2023 - cycle #1  Venatoclax- x1 month- HELD sec to repeated hospitalization  # DISCONTINUED Erlene Senters [sec to intolearnce]  # PE acute [NOV 2022] provoked--currently s/p  Eliquis x 6 months [end of  April 2023].    # MAY 1st, 2023- START ACALABRUTINIB 100 mg BID  # December 2022-pericarditis/effusion needing pericardiocentesis; FEB 2023-acute respiratory failure-hospitalization-? Etiology [s/p ID; Pul; Dr.A; cards- [facility]]  # Lung cancer screening- on [facility]  # SURVIVORSHIP:   # GENETICS:   DIAGNOSIS:   STAGE:         ;  GOALS:  CURRENT/MOST RECENT THERAPY :       CLL (chronic lymphocytic leukemia) (HCC)  10/29/2019 Initial Diagnosis   CLL (chronic lymphocytic leukemia) (HCC)   06/15/2021 -  Chemotherapy   Patient is on Treatment Plan : LYMPHOMA CLL/SLL Venetoclax + Obinutuzumab q28d      INTERVAL HISTORY: Ambulating independently.  Alone.   A pleasant 72 year-old male patient with CLL/SLL currently most currently on acalabrutinib daily [except weekend PMs]  is here for follow-up/ review results of the CT scan.  Fatigue  is improved/ stable on current regimen of acalabrutinib. Otherwise denies any lumps or bumps.  No fevers.  No chills.    Denies any wheezing.  Denies any worsening cough.  No recent hospitalization.   Review of Systems  Constitutional:  Positive for malaise/fatigue. Negative for chills, diaphoresis, fever and weight loss.  HENT:  Negative for nosebleeds and sore throat.   Eyes:  Negative for double vision.  Respiratory:  Negative for cough, hemoptysis, sputum production, shortness of breath and wheezing.    Cardiovascular:  Negative for chest pain, palpitations, orthopnea and leg swelling.  Gastrointestinal:  Negative for abdominal pain, blood in stool, constipation, diarrhea, heartburn, melena, nausea and vomiting.  Genitourinary:  Negative for dysuria, frequency and urgency.  Musculoskeletal:  Positive for joint pain and myalgias. Negative for back pain.  Skin: Negative.  Negative for itching and rash.  Neurological:  Negative for dizziness, tingling, focal weakness, weakness and headaches.  Endo/Heme/Allergies:  Does not bruise/bleed easily.  Psychiatric/Behavioral:  Negative for depression. The patient is not nervous/anxious and does not have insomnia.      PAST MEDICAL HISTORY :  Past Medical History:  Diagnosis Date   Atherosclerosis of coronary artery 09/28/2016   Chest CT Nov 2017   Atrial tachycardia    CLL (chronic lymphocytic leukemia) (HCC)    Elevated C-reactive protein (CRP)    Elevated PSA    Fractured sternum 1999   Herpes    History of rib fracture 1999   multiple   Hyperlipidemia    Psoriasis    Pulmonary embolism (HCC)    Rheumatoid factor positive 02/2014   Rosacea    Thoracic aortic atherosclerosis (HCC) 09/28/2016   Chest CT Nov 2017   Tobacco abuse    Vitamin D deficiency disease     PAST SURGICAL HISTORY :   Past Surgical History:  Procedure Laterality Date   APPENDECTOMY  11/27/1972   IR REMOVAL TUN ACCESS W/ PORT W/O FL MOD SED  03/20/2022   PERICARDIOCENTESIS N/A 10/03/2021   Procedure: PERICARDIOCENTESIS;  Surgeon: Corky Crafts, MD;  Location: [facility] INVASIVE CV LAB;  Service: Cardiovascular;  Laterality: N/A;   PORTA CATH INSERTION N/A 06/06/2021   Procedure: PORTA CATH INSERTION;  Surgeon: Annice Needy, MD;  Location: [facility] INVASIVE CV LAB;  Service: Cardiovascular;  Laterality: N/A;   SHOULDER ARTHROSCOPY  10/23/2016   Procedure: ARTHROSCOPY SHOULDER WITH DISTAL CLAVICLE EXCISION, PARTIAL ACROMIONECTOMY, AND DEBRIDEMENT;  Surgeon: Deeann Saint, MD;  Location: [facility] ORS;  Service: Orthopedics;;   TIBIA FRACTURE SURGERY Left 11/27/2002   titanium rod, ORIF   TRIGGER FINGER RELEASE Right 04/27/2023   rt ring finger/Emerge    FAMILY HISTORY :   Family History  Problem Relation Age of Onset   Heart disease Father    Osteoarthritis Father    Alzheimer's disease Father    Cancer Neg Hx    Diabetes Neg Hx    Hypertension Neg Hx    Stroke Neg Hx    COPD Neg Hx     SOCIAL HISTORY:   Social History   Tobacco Use   Smoking status: Every Day    Packs/day: 0.50    Years: 45.00    Additional pack years: 0.00    Total pack years: 22.50    Types: Cigarettes   Smokeless tobacco: Never   Tobacco comments:    11/6- currently smoking 5 cigarettes a day   Vaping Use   Vaping Use: Never used  Substance Use Topics   Alcohol  use: Yes    Alcohol/week: 3.0 standard drinks of alcohol    Types: 3 Glasses of wine per week    Comment: a couple glasses of wine a week   Drug use: No    ALLERGIES:  is allergic to gluten meal.  MEDICATIONS:  Current Outpatient Medications  Medication Sig Dispense Refill   acalabrutinib maleate (CALQUENCE) 100 MG tablet Take 1 tablet (100mg ) by mouth daily in the morning and 1 tablet (100mg ) in evening on Mondays, Wednesdays, and Fridays. 40 tablet 2   cholecalciferol (VITAMIN D3) 25 MCG (1000 UNIT) tablet Take 1,000 Units by mouth daily.     Roflumilast (ZORYVE) 0.3 % CREA Apply 1 Application topically at bedtime. Qhs to psoriasis on legs and arms 60 g 6   No current facility-administered medications for this visit.    PHYSICAL EXAMINATION: ECOG PERFORMANCE STATUS: 0 - Asymptomatic  BP 114/82 (BP Location: Left Arm, Patient Position: Sitting, Cuff Size: Normal)   Pulse 82   Temp (!) 96.1 F (35.6 C) (Tympanic)   Ht 5\' 6"  (1.676 m)   Wt 166 lb 3.2 oz (75.4 kg)   SpO2 98%   BMI 26.83 kg/m   Filed Weights   05/08/23 1330  Weight: 166 lb 3.2 oz (75.4 kg)   No significant  lymphadenopathy lymphadenopathy in the neck/underarm.  Physical Exam HENT:     Head: Normocephalic and atraumatic.     Mouth/Throat:     Pharynx: No oropharyngeal exudate.  Eyes:     Pupils: Pupils are equal, round, and reactive to light.  Cardiovascular:     Rate and Rhythm: Normal rate and regular rhythm.  Pulmonary:     Effort: No respiratory distress.     Breath sounds: No wheezing.  Abdominal:     General: Bowel sounds are normal. There is no distension.     Palpations: Abdomen is soft. There is no mass.     Tenderness: There is no abdominal tenderness. There is no guarding or rebound.  Musculoskeletal:        General: No tenderness. Normal range of motion.     Cervical back: Normal range of motion and neck supple.  Skin:    General: Skin is warm.  Neurological:     Mental Status: He is alert and oriented to person, place, and time.  Psychiatric:        Mood and Affect: Affect normal.     LABORATORY DATA:  I have reviewed the data as listed    Component Value Date/Time   NA PENDING 05/08/2023 1331   NA 139 10/27/2021 0857   K PENDING 05/08/2023 1331   CL PENDING 05/08/2023 1331   CO2 PENDING 05/08/2023 1331   GLUCOSE 61 (L) 05/08/2023 1331   BUN 9 05/08/2023 1331   BUN 16 10/27/2021 0857   CREATININE 0.93 05/08/2023 1331   CREATININE 0.84 10/06/2019 0000   CALCIUM PENDING 05/08/2023 1331   PROT 6.4 (L) 05/08/2023 1331   ALBUMIN 4.0 05/08/2023 1331   AST 18 05/08/2023 1331   ALT 22 05/08/2023 1331   ALKPHOS 70 05/08/2023 1331   BILITOT 0.5 05/08/2023 1331   GFRNONAA >60 05/08/2023 1331   GFRNONAA 91 10/06/2019 0000   GFRAA >60 12/26/2019 0842   GFRAA 106 10/06/2019 0000    No results found for: "SPEP", "UPEP"  Lab Results  Component Value Date   WBC 12.0 (H) 05/08/2023   NEUTROABS 8.9 (H) 05/08/2023   HGB 15.9 05/08/2023   HCT 45.6 05/08/2023  MCV 89.9 05/08/2023   PLT 242 05/08/2023      Chemistry      Component Value Date/Time   NA  PENDING 05/08/2023 1331   NA 139 10/27/2021 0857   K PENDING 05/08/2023 1331   CL PENDING 05/08/2023 1331   CO2 PENDING 05/08/2023 1331   BUN 9 05/08/2023 1331   BUN 16 10/27/2021 0857   CREATININE 0.93 05/08/2023 1331   CREATININE 0.84 10/06/2019 0000      Component Value Date/Time   CALCIUM PENDING 05/08/2023 1331   ALKPHOS 70 05/08/2023 1331   AST 18 05/08/2023 1331   ALT 22 05/08/2023 1331   BILITOT 0.5 05/08/2023 1331       RADIOGRAPHIC STUDIES: I have personally reviewed the radiological images as listed and agreed with the findings in the report. No results found.   ASSESSMENT & PLAN:   .CLL (chronic lymphocytic leukemia) (HCC) # SLL/CLL-patient currently on acalabrutinib 100 mg twice a day [since start of May 2023]- tolerating well except mild-moderate-severe fatigue/insominia [see below]. Continue acalbrutinib, but SKIP the PM dose Tuesday;Thursday;Sat & Sun.  # Clinically, no evidence of any progression.  Labs within normal limits. JUNE 8th, 2024-  CT CAP- No pathologically enlarged lymph nodes within the chest, abdomen or pelvis and no splenomegaly. stable.   # Myalgias/Fatigue/ Insomnia: ? Related Acalabrutinib- Continue acalbrutinib, but SKIP the PM dose 4 days. s/p Exercise/CARE program. stable.   # Psoriasis/? Arthritis- relative contraindication with TNF-Alpha, but not absolute- on topical zorvye- stable.   # Active smoker: recommend quitting smoking; cutting down-   # Enlarged prostate- PSA 7.0 [MAY 2024-]- awaiting urology evaluation [Dr. Sninski] this week.   # Secondary malignancies: dermatology [Dr.Stewart ]  # IV access: PIV  # Vaccines: ok with shingrix # [first in April, 2023];  S/p-flu/RSV/Pneumoanis vaccines; s/p covid.   #Incidental findings on Imaging  CT ,JUNE 2024: 2. Hepatic steatosis; Pancolonic diverticulosis without findings of acute diverticulitis; Aortic Atherosclerosis and Emphysema .I reviewed/discussed/counseled the patient.  #  DISPOSITION: # follow up in 3  months MD; labs- cbc/cmp; URIC ACID; LDH;mag;  Dr.B  # I reviewed the blood work- with the patient in detail; also reviewed the imaging independently [as summarized above]; and with the patient in detail.    Orders Placed This Encounter  Procedures   CBC with Differential (Cancer Center Only)    Standing Status:   Future    Standing Expiration Date:   05/07/2024   CMP (Cancer Center only)    Standing Status:   Future    Standing Expiration Date:   05/07/2024   Uric acid    Standing Status:   Future    Standing Expiration Date:   05/07/2024   Lactate dehydrogenase    Standing Status:   Future    Standing Expiration Date:   05/07/2024   Magnesium    Standing Status:   Future    Standing Expiration Date:   05/07/2024       Earna Coder, MD 05/08/2023 2:24 PM

## 2023-05-09 ENCOUNTER — Encounter: Payer: Self-pay | Admitting: Urology

## 2023-05-09 ENCOUNTER — Ambulatory Visit (INDEPENDENT_AMBULATORY_CARE_PROVIDER_SITE_OTHER): Payer: Medicare PPO | Admitting: Urology

## 2023-05-09 VITALS — BP 123/77 | HR 82 | Ht 66.0 in | Wt 167.2 lb

## 2023-05-09 DIAGNOSIS — R972 Elevated prostate specific antigen [PSA]: Secondary | ICD-10-CM

## 2023-05-09 DIAGNOSIS — Z8042 Family history of malignant neoplasm of prostate: Secondary | ICD-10-CM | POA: Diagnosis not present

## 2023-05-09 NOTE — Progress Notes (Signed)
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  No prior PSA values to review.  He has a family history of prostate cancer in his father that was treated at age 75 with surgery.  He also has CLL and is followed by oncology.  He reportedly underwent a prostate biopsy with Dr. Sheppard Penton about 10 years ago which showed inflammation, those records are not available to me, and he is unsure what his PSA was at that time.  He denies any urinary symptoms or gross hematuria.  Prostate measured 58 g on prior CT from June 2024 equating  to a PSA density of 0.12.   PMH: Past Medical History:  Diagnosis Date   Atherosclerosis of coronary artery 09/28/2016   Chest CT Nov 2017   Atrial tachycardia    CLL (chronic lymphocytic leukemia) (HCC)    Elevated C-reactive protein (CRP)    Elevated PSA    Fractured sternum 1999   Herpes    History of rib fracture 1999   multiple   Hyperlipidemia    Psoriasis    Pulmonary embolism (HCC)    Rheumatoid factor positive 02/2014   Rosacea    Thoracic aortic atherosclerosis (HCC) 09/28/2016   Chest CT Nov 2017   Tobacco abuse    Vitamin D deficiency disease     Surgical History: Past Surgical History:  Procedure Laterality Date   APPENDECTOMY  11/27/1972   IR REMOVAL TUN ACCESS W/ PORT W/O FL MOD SED  03/20/2022   PERICARDIOCENTESIS N/A 10/03/2021   Procedure: PERICARDIOCENTESIS;  Surgeon: Corky Crafts, MD;  Location[facility] INVASIVE CV LAB;  Service: Cardiovascular;  Laterality: N/A;   PORTA CATH INSERTION N/A 06/06/2021   Procedure: PORTA CATH INSERTION;  Surgeon: Annice Needy, MD;  Location: [facility] INVASIVE CV LAB;  Service: Cardiovascular;  Laterality: N/A;   SHOULDER ARTHROSCOPY  10/23/2016   Procedure: ARTHROSCOPY SHOULDER WITH DISTAL CLAVICLE EXCISION, PARTIAL ACROMIONECTOMY,  AND DEBRIDEMENT;  Surgeon: Deeann Saint, MD;  Location: [facility] ORS;  Service: Orthopedics;;   TIBIA FRACTURE SURGERY Left 11/27/2002   titanium rod, ORIF   TRIGGER FINGER RELEASE Right 04/27/2023   rt ring finger/Emerge    Family History: Family History  Problem Relation Age of Onset   Heart disease Father    Osteoarthritis Father    Alzheimer's disease Father    Cancer Neg Hx    Diabetes Neg Hx    Hypertension Neg Hx    Stroke Neg Hx    COPD Neg Hx     Social History:  reports that he has been smoking cigarettes. He has a 22.50 pack-year smoking history. He has never used smokeless tobacco. He reports current alcohol use of about 3.0 standard drinks of alcohol per week. He reports that he does not use drugs.  Physical Exam: BP 123/77 (BP Location: Left Arm, Patient Position: Sitting, Cuff Size: Normal)   Pulse 82   Ht 5\' 6"  (1.676 m)   Wt 167 lb 3.2 oz (75.8 kg)   BMI 26.99 kg/m    Constitutional:  Alert and oriented, No acute distress. Cardiovascular: No clubbing, cyanosis, or edema. Respiratory: Normal respiratory effort, no increased work of breathing. GI: Abdomen is soft, nontender, nondistended, no abdominal masses DRE: Patient deferred  Laboratory Data: Reviewed, see HPI  Pertinent Imaging: I have personally viewed and  interpreted the recent CT dated 05/03/2023 showing a 58 g prostate, no other urologic abnormalities.  Assessment & Plan:   72 year old male with family history of prostate cancer in his father and elevated PSA of 7.05.  He does have a history of negative prostate biopsy about 10 years ago with Dr. Sheppard Penton, but those records are not available to me, and he is unsure what his PSA was at that time.  We reviewed the implications of an elevated PSA and the uncertainty surrounding it. In general, a man's PSA increases with age and is produced by both normal and cancerous prostate tissue. The differential diagnosis for elevated PSA includes BPH, prostate cancer,  infection, recent intercourse/ejaculation, recent urethroscopic manipulation (foley placement/cystoscopy) or trauma, and prostatitis. Management of an elevated PSA can include observation or prostate biopsy and we discussed this in detail. Our goal is to detect clinically significant prostate cancers, and manage with either active surveillance, surgery, or radiation for localized disease. Risks of prostate biopsy include bleeding, infection (including life threatening sepsis), pain, and lower urinary symptoms. Hematuria, hematospermia, and blood in the stool are all common after biopsy and can persist up to 4 weeks.   I recommended a repeat PSA with reflex to free, and consideration of prostate MRI or biopsy if remains concerning.  He is traveling this summer but was amenable to a repeat PSA with reflex to free in 2 to 3 months.  Will try to obtain outside records from Dr. Terrance Mass office as well   Legrand Rams, MD 05/09/2023  [facility] [address], [address] [place], [place] [address] [phone]

## 2023-05-09 NOTE — Patient Instructions (Signed)

## 2023-05-24 ENCOUNTER — Other Ambulatory Visit (HOSPITAL_COMMUNITY): Payer: Self-pay

## 2023-05-28 ENCOUNTER — Other Ambulatory Visit (HOSPITAL_COMMUNITY): Payer: Self-pay

## 2023-05-29 ENCOUNTER — Other Ambulatory Visit (HOSPITAL_COMMUNITY): Payer: Self-pay

## 2023-05-29 ENCOUNTER — Other Ambulatory Visit: Payer: Self-pay

## 2023-05-29 ENCOUNTER — Other Ambulatory Visit: Payer: Self-pay | Admitting: Internal Medicine

## 2023-05-29 DIAGNOSIS — C9110 Chronic lymphocytic leukemia of B-cell type not having achieved remission: Secondary | ICD-10-CM

## 2023-05-29 MED ORDER — CALQUENCE 100 MG PO TABS
ORAL_TABLET | ORAL | 2 refills | Status: DC
Start: 2023-05-29 — End: 2024-02-06
  Filled 2023-05-29: qty 40, 28d supply, fill #0
  Filled 2023-06-29: qty 40, 28d supply, fill #1
  Filled 2023-07-24: qty 40, 28d supply, fill #2

## 2023-05-29 NOTE — Telephone Encounter (Signed)
CBC with Differential (Cancer Center Only) Order: [id number] Status: Final result     Visible to patient: Yes (seen)     Next appt: 08/08/2023 at 01:15 PM in Oncology ([facility])     Dx: CLL (chronic lymphocytic leukemia) (HCC)   0 Result Notes          Component Ref Range & Units 3 wk ago (05/08/23) 3 mo ago (01/31/23) 6 mo ago (11/01/22) 9 mo ago (09/01/22) 10 mo ago (07/18/22) 1 yr ago (05/29/22) 1 yr ago (05/19/22)  WBC Count 4.0 - 10.5 K/uL 12.0 High  11.3 High  7.7 7.9 9.5 15.2 High  12.5 High   RBC 4.22 - 5.81 MIL/uL 5.07 5.27 5.35 5.11 4.90 5.12 5.07  Hemoglobin 13.0 - 17.0 g/dL 40.9 81.1 91.4 78.2 95.6 15.6 15.7  HCT 39.0 - 52.0 % 45.6 47.8 47.9 46.5 44.2 44.7 44.4  MCV 80.0 - 100.0 fL 89.9 90.7 89.5 91.0 90.2 87.3 87.6  MCH 26.0 - 34.0 pg 31.4 31.1 30.8 31.5 31.0 30.5 31.0  MCHC 30.0 - 36.0 g/dL 21.3 08.6 57.8 46.9 62.9 34.9 35.4  RDW 11.5 - 15.5 % 13.3 13.5 13.3 13.3 14.4 14.2 13.6  Platelet Count 150 - 400 K/uL 242 277 242 260 252 433 High  244  nRBC 0.0 - 0.2 % 0.0 0.0 0.0 0.0 0.0 0.0 0.0  Neutrophils Relative % % 75 76 65 72 68 86 76  Neutro Abs 1.7 - 7.7 K/uL 8.9 High  8.7 High  5.1 5.6 6.5 13.2 High  9.6 High   Lymphocytes Relative % 16 14 20 17 18 6 11   Lymphs Abs 0.7 - 4.0 K/uL 1.9 1.5 1.5 1.3 1.7 0.9 1.4  Monocytes Relative % 6 7 10 8 11 1 10   Monocytes Absolute 0.1 - 1.0 K/uL 0.8 0.8 0.8 0.6 1.1 High  0.2 1.3 High   Eosinophils Relative % 2 2 3 2 1  0 1  Eosinophils Absolute 0.0 - 0.5 K/uL 0.3 0.2 0.2 0.2 0.1 0.0 0.1  Basophils Relative % 1 1 1 1 1 1 1   Basophils Absolute 0.0 - 0.1 K/uL 0.1 0.1 0.1 0.1 0.1 0.1 0.1  Immature Granulocytes % 0 0 1 0 1 6 1   Abs Immature Granulocytes 0.00 - 0.07 K/uL 0.05 0.05 CM 0.04 CM 0.03 CM 0.06 CM 0.85 High  CM 0.06 CM  Comment: Performed at [facility], [address] [place], [place] [address]  WBC Morphology      CONFIRMED BY MANUAL DIFF   RBC Morphology      MORPHOLOGY UNREMARKABLE   Smear  Review      Normal platelet morphology CM   Resulting Agency [facility]         Specimen Collected: 05/08/23 13:31 Last Resulted: 05/08/23 13:49      Lab Flowsheet      Order Details      View Encounter      Lab and Collection Details      Routing      Result History    View All Conversations on this Encounter      CM=Additional comments      Result Care Coordination   Patient Communication   Add Comments   Seen Back to Top      Other Results from 05/08/2023  CK Order: [id number] Status: Final result      Visible  to patient: Yes (seen)      Next appt: 08/08/2023 at 01:15 PM in Oncology ([facility])      Dx: CLL (chronic lymphocytic leukemia) (HCC)    0 Result Notes       Component Ref Range & Units 3 wk ago 3 mo ago  Total CK 49 - 397 U/L 52 73 CM  Comment: Performed at [facility], [address] [place], [place] [address]  Resulting Agency [facility] CLIN LAB [facility] LAB         Specimen Collected: 05/08/23 13:31 Last Resulted: 05/08/23 14:48      Lab Flowsheet       Order Details       View Encounter       Lab and Collection Details       Routing       Result History     View All Conversations on this Encounter      CM=Additional comments      Result Care Coordination   Patient Communication   Add Comments   Seen Back to Top        Magnesium Order: [id number] Status: Final result      Visible to patient: Yes (seen)      Next appt: 08/08/2023 at 01:15 PM in Oncology ([facility])      Dx: CLL (chronic lymphocytic leukemia) (HCC)    0 Result Notes            Component Ref Range & Units 3 wk ago (05/08/23) 3 mo ago (01/31/23) 1 yr ago (12/27/21) 1 yr ago (12/09/21) 1 yr ago (12/02/21) 1 yr ago (11/25/21) 1 yr ago (10/16/21)  Magnesium 1.7 - 2.4 mg/dL 2.0 2.2 CM 1.8 CM 2.0 CM 1.9 CM 2.0 CM 2.0 CM  Comment: Performed at [facility], [address] [place], [place] [address]  Resulting Agency [facility]         Specimen Collected: 05/08/23 13:31 Last Resulted: 05/08/23 13:51      Lab Flowsheet       Order Details       View Encounter       Lab and Collection Details       Routing       Result History     View All Conversations on this Encounter      CM=Additional comments      Result Care Coordination   Patient Communication   Add Comments   Seen Back to Top        Lactate dehydrogenase Order: [id number] Status: Final result      Visible to patient: Yes (seen)      Next appt: 08/08/2023 at 01:15 PM in Oncology ([facility])      Dx: CLL (chronic lymphocytic leukemia) (HCC)    0 Result Notes            Component Ref Range & Units 3 wk ago (05/08/23) 3 mo ago (01/31/23) 6 mo ago (11/01/22) 9 mo ago (09/01/22) 10 mo ago (07/18/22) 1 yr ago (05/29/22) 1 yr ago (04/27/22)  LDH 98 - 192 U/L 109 130 CM 117 CM 122 CM 137 CM 155 CM 111 CM  Comment: Performed at [facility], [address] [place], [place] [address]  Resulting Agency [facility]  [facility]         Specimen Collected: 05/08/23 13:31 Last Resulted: 05/08/23 13:51

## 2023-06-08 ENCOUNTER — Other Ambulatory Visit (HOSPITAL_COMMUNITY): Payer: Self-pay

## 2023-06-29 ENCOUNTER — Other Ambulatory Visit (HOSPITAL_COMMUNITY): Payer: Self-pay

## 2023-06-29 ENCOUNTER — Other Ambulatory Visit: Payer: Self-pay

## 2023-07-24 ENCOUNTER — Other Ambulatory Visit (HOSPITAL_COMMUNITY): Payer: Self-pay

## 2023-08-08 ENCOUNTER — Inpatient Hospital Stay (HOSPITAL_BASED_OUTPATIENT_CLINIC_OR_DEPARTMENT_OTHER): Payer: Medicare PPO | Admitting: Internal Medicine

## 2023-08-08 ENCOUNTER — Encounter: Payer: Self-pay | Admitting: Internal Medicine

## 2023-08-08 ENCOUNTER — Inpatient Hospital Stay: Payer: Medicare PPO | Attending: Internal Medicine

## 2023-08-08 VITALS — BP 121/84 | HR 88 | Temp 96.8°F | Ht 66.0 in | Wt 165.8 lb

## 2023-08-08 DIAGNOSIS — M2550 Pain in unspecified joint: Secondary | ICD-10-CM | POA: Diagnosis not present

## 2023-08-08 DIAGNOSIS — Z86711 Personal history of pulmonary embolism: Secondary | ICD-10-CM | POA: Diagnosis not present

## 2023-08-08 DIAGNOSIS — Z79899 Other long term (current) drug therapy: Secondary | ICD-10-CM | POA: Insufficient documentation

## 2023-08-08 DIAGNOSIS — Z9049 Acquired absence of other specified parts of digestive tract: Secondary | ICD-10-CM | POA: Diagnosis not present

## 2023-08-08 DIAGNOSIS — Z818 Family history of other mental and behavioral disorders: Secondary | ICD-10-CM | POA: Diagnosis not present

## 2023-08-08 DIAGNOSIS — J449 Chronic obstructive pulmonary disease, unspecified: Secondary | ICD-10-CM | POA: Diagnosis not present

## 2023-08-08 DIAGNOSIS — M791 Myalgia, unspecified site: Secondary | ICD-10-CM | POA: Diagnosis not present

## 2023-08-08 DIAGNOSIS — Z8249 Family history of ischemic heart disease and other diseases of the circulatory system: Secondary | ICD-10-CM | POA: Diagnosis not present

## 2023-08-08 DIAGNOSIS — C9110 Chronic lymphocytic leukemia of B-cell type not having achieved remission: Secondary | ICD-10-CM | POA: Insufficient documentation

## 2023-08-08 DIAGNOSIS — Z8261 Family history of arthritis: Secondary | ICD-10-CM | POA: Diagnosis not present

## 2023-08-08 DIAGNOSIS — F17210 Nicotine dependence, cigarettes, uncomplicated: Secondary | ICD-10-CM | POA: Insufficient documentation

## 2023-08-08 DIAGNOSIS — E785 Hyperlipidemia, unspecified: Secondary | ICD-10-CM | POA: Insufficient documentation

## 2023-08-08 LAB — CBC WITH DIFFERENTIAL (CANCER CENTER ONLY)
Abs Immature Granulocytes: 0.04 10*3/uL (ref 0.00–0.07)
Basophils Absolute: 0.1 10*3/uL (ref 0.0–0.1)
Basophils Relative: 1 %
Eosinophils Absolute: 0.2 10*3/uL (ref 0.0–0.5)
Eosinophils Relative: 2 %
HCT: 46.3 % (ref 39.0–52.0)
Hemoglobin: 16 g/dL (ref 13.0–17.0)
Immature Granulocytes: 0 %
Lymphocytes Relative: 18 %
Lymphs Abs: 1.7 10*3/uL (ref 0.7–4.0)
MCH: 31.5 pg (ref 26.0–34.0)
MCHC: 34.6 g/dL (ref 30.0–36.0)
MCV: 91.1 fL (ref 80.0–100.0)
Monocytes Absolute: 0.7 10*3/uL (ref 0.1–1.0)
Monocytes Relative: 7 %
Neutro Abs: 6.8 10*3/uL (ref 1.7–7.7)
Neutrophils Relative %: 72 %
Platelet Count: 242 10*3/uL (ref 150–400)
RBC: 5.08 MIL/uL (ref 4.22–5.81)
RDW: 13.3 % (ref 11.5–15.5)
WBC Count: 9.5 10*3/uL (ref 4.0–10.5)
nRBC: 0 % (ref 0.0–0.2)

## 2023-08-08 LAB — CMP (CANCER CENTER ONLY)
ALT: 22 U/L (ref 0–44)
AST: 19 U/L (ref 15–41)
Albumin: 3.8 g/dL (ref 3.5–5.0)
Alkaline Phosphatase: 68 U/L (ref 38–126)
Anion gap: 6 (ref 5–15)
BUN: 9 mg/dL (ref 8–23)
CO2: 23 mmol/L (ref 22–32)
Calcium: 9.1 mg/dL (ref 8.9–10.3)
Chloride: 106 mmol/L (ref 98–111)
Creatinine: 0.88 mg/dL (ref 0.61–1.24)
GFR, Estimated: >60 mL/min (ref >60)
Glucose, Bld: 105 mg/dL — ABNORMAL HIGH (ref 70–99)
Potassium: 3.7 mmol/L (ref 3.5–5.1)
Sodium: 135 mmol/L (ref 135–145)
Total Bilirubin: 0.6 mg/dL (ref 0.3–1.2)
Total Protein: 6.3 g/dL — ABNORMAL LOW (ref 6.5–8.1)

## 2023-08-08 LAB — LACTATE DEHYDROGENASE: LDH: 104 U/L (ref 98–192)

## 2023-08-08 LAB — MAGNESIUM: Magnesium: 2.1 mg/dL (ref 1.7–2.4)

## 2023-08-08 LAB — URIC ACID: Uric Acid, Serum: 6 mg/dL (ref 3.7–8.6)

## 2023-08-08 NOTE — Progress Notes (Signed)
[facility] OFFICE PROGRESS NOTE  Patient Care Team: Margarita Mail, DO as PCP - General (Internal Medicine) End, Cristal Deer, MD as PCP - Cardiology (Cardiology) Lada, Janit Bern, MD as Attending Physician (Family Medicine) Willeen Niece, MD (Dermatology) Sondra Come, MD as Consulting Physician (Urology) Earna Coder, MD as Consulting Physician (Hematology and Oncology) Earna Coder, MD as Consulting Physician (Oncology)   SUMMARY OF ONCOLOGIC HISTORY:  Oncology History Overview Note  # 2016- CLL-[flow] CD5 (+)/CD23 (+) clonal B-cell population, CLL/SLL phenotype, 18% of leukocytes, <5,000/uL, CD38 positive   # NOV 2020 PET [incidental]- Generalized Lymphadenopathy- left supraclav/ bil ax LN L> R; mesenteric LN; pelvic LN; FEB 5th 2021- 2.5cm left supraclav; Left ax LN- 2.4cm.   AUG 2021-Head and neck: The brain demonstrates symmetric appearing diffuse physiologic uptake without focal finding to suggest abnormality. In the left posterior and medial maxillary cavity, there is an area of mucoid deposition seen within SUV max of 28.8. This was seen in the previous study of November. The specific bony erosion, clear-cut soft tissue lesion is not detected. This probably represents a chronic sinus disease focus. The bilateral optic nerves and extraocular muscles to the largest nodes are seen on axial image 98. 1 now demonstrates a short axis measurement of 3.3 cm and the other more medially, a short axis measurement of 2.5 cm. Demonstrate symmetric increased uptake. The left, much greater than right, jugulodigastric and supraclavicular nodes have increased significantly in size and avidity since the previous study. Both of these lymph nodes demonstrate an SUV max of 3.9. No largest lymph node in the right subclavicular space is seen on axial image 104, demonstrating an SUV max of 3.3, and a short axis measurement of 1.4 cm.   On axial image 102, a right  paratracheal lymph node is again identified with increased avidity, located immediately posterior to the right thyroid pole. This area was noted previously, but now increased avidity is seen, with an SUV max of 8.6 in this area. Multiple smaller, and less avid lymph nodes scattered throughout the left greater than right neck are seen, as well as supraclavicular and infraclavicular areas.   Thorax: Significant axillary lymph nodes have increased significantly in size. These are also left greater than right, with the largest on the left seen on image #114 with a short axis measurement of 2.2 cm and an SUV max of 4.3. The largest on the right as seen on image 111, demonstrating a short axis of 1.4 cm and an SUV max of 4.0. Too numerous to count smaller and similarly avid lymph nodes in both axillae are also noted. These are all larger than previously seen and in general, more avid.  Numerous small AP window and lesser mediastinal lymph nodes are seen with minimal increased avidity, appearing similar in size and avidity to the previous study. No other significant mediastinal adenopathy. No hilar adenopathy detected. These likely also represent lymphoproliferative disease.  Hyperinflation of COPD, bibasilar dependent atelectasis, some paucity of lung tissue and biapical scarring are noted without other focal pulmonary mass or nodule, or acute pulmonary parenchymal disease. Physiologic uptake in the left heart is seen, as expected. Small hiatal hernia is again identified.   Abdomen pelvis: Physiologic uptake in the liver, gut and urinary tract is again noted. No focal area of asymmetric uptake in the structures is seen. The spleen also demonstrates generalized increased uptake and stable splenomegaly, up to 14.2 cm. On image 198, a preaortic lymph node is detected of  increased size since the previous study, now measuring up to 1.2 cm short axis.  Several large lymph nodes on image 203 of the mesentery are noted.  The largest of these, on image 201 and ureters up to 1.8 cm short axis. Despite the fact that measurable avidity is not detected in these are the surrounding lymph nodes, these are doubtless involved in the lymphoproliferative disease described, and appear larger. Multiple smaller lymph nodes scattered throughout the mesentery are also seen.  Grossly enlarged right greater than left common iliac chain lymph nodes are again seen, throughout the pelvis and into the inguinal areas bilaterally. The most avid on the right is seen on image #260 to, demonstrates a short axis of 2.5 cm, and an SUV max of 4.9. The largest on the left is identified image #272, demonstrates a short axis of 2.0 cm, and an SUV max of 4.7. These are similar to the previously noted values.  Right greater than left inguinal adenopathy is seen, with the largest lymph node seen on the right image 276, demonstrating a short axis of 1.5 cm and an SUV max of 3.9. This is larger in size and avidity than previously seen. There are bilateral enlarged lymph nodes associated with the external iliac vessels on image 277. These measure 2.0 cm on the right in short axis, with an SUV max of 3.9.  On the left, image 278, the external iliac lymph node is larger at 1.8 cm short axis, but with an SUV max of only 3.8. Lesser avid and smaller lymph nodes on both sides are also detected.   Musculoskeletal: Scattered, insignificant uptake is detected in the muscles especially in the bilateral joints, without focal finding to suggest lymphoproliferative disease.   IMPRESSIONS:  Significant increase in numbers, short axis size, and to a large extent, avidity -in the lymph nodes of the left greater than right neck, supraclavicular and infraclavicular areas, bilateral axillary lymph nodes and common iliac chain, external iliac chain and inguinal lymph nodes, consistent with worsening lymphoproliferative disease. The mediastinal lymph nodes seen are still present and  unchanged in size or [significantly] in avidity.    [place]- Bx- SLL/CLL.July 2022-Discussed with Dr. Asquith-oncologist, [place]-reviewed the pathology of the lymph node biopsy in 2021-positive for SLL; negative for cyclin D1/1114 translocation.    # July 20th, 2022- GAZYVA #1 Stann Mainland plan ading VENATOCLAX after 2 cycles/de-bulking] s/p GAYZYVA [x6]- Feb 2023 - cycle #1  Venatoclax- x1 month- HELD sec to repeated hospitalization  # DISCONTINUED Erlene Senters [sec to intolearnce]  # PE acute [NOV 2022] provoked--currently s/p  Eliquis x 6 months [end of  April 2023].    # MAY 1st, 2023- START ACALABRUTINIB 100 mg BID  # December 2022-pericarditis/effusion needing pericardiocentesis; FEB 2023-acute respiratory failure-hospitalization-? Etiology [s/p ID; Pul; Dr.A; cards- [facility]]  # Lung cancer screening- on LSCP  # SURVIVORSHIP:   # GENETICS:   DIAGNOSIS:   STAGE:         ;  GOALS:  CURRENT/MOST RECENT THERAPY :       CLL (chronic lymphocytic leukemia) (HCC)  10/29/2019 Initial Diagnosis   CLL (chronic lymphocytic leukemia) (HCC)   06/15/2021 -  Chemotherapy   Patient is on Treatment Plan : LYMPHOMA CLL/SLL Venetoclax + Obinutuzumab q28d      INTERVAL HISTORY: Ambulating independently.  Alone.   A pleasant 72 year-old male patient with CLL/SLL currently most currently on acalabrutinib daily [except weekend PMs]  is here for follow-up.  S/p evaluation with urology. Denies any symptoms-  of prostatism.  Otherwise denies any lumps or bumps.  No fevers.  No chills.    Fatigue is improved/ stable on current regimen of acalabrutinib.  Denies any wheezing.  Denies any worsening cough.  No recent hospitalization.   Review of Systems  Constitutional:  Positive for malaise/fatigue. Negative for chills, diaphoresis, fever and weight loss.  HENT:  Negative for nosebleeds and sore throat.   Eyes:  Negative for double vision.  Respiratory:  Negative for cough, hemoptysis, sputum production,  shortness of breath and wheezing.   Cardiovascular:  Negative for chest pain, palpitations, orthopnea and leg swelling.  Gastrointestinal:  Negative for abdominal pain, blood in stool, constipation, diarrhea, heartburn, melena, nausea and vomiting.  Genitourinary:  Negative for dysuria, frequency and urgency.  Musculoskeletal:  Positive for joint pain and myalgias. Negative for back pain.  Skin: Negative.  Negative for itching and rash.  Neurological:  Negative for dizziness, tingling, focal weakness, weakness and headaches.  Endo/Heme/Allergies:  Does not bruise/bleed easily.  Psychiatric/Behavioral:  Negative for depression. The patient is not nervous/anxious and does not have insomnia.      PAST MEDICAL HISTORY :  Past Medical History:  Diagnosis Date   Atherosclerosis of coronary artery 09/28/2016   Chest CT Nov 2017   Atrial tachycardia    CLL (chronic lymphocytic leukemia) (HCC)    Elevated C-reactive protein (CRP)    Elevated PSA    Fractured sternum 1999   Herpes    History of rib fracture 1999   multiple   Hyperlipidemia    Psoriasis    Pulmonary embolism (HCC)    Rheumatoid factor positive 02/2014   Rosacea    Thoracic aortic atherosclerosis (HCC) 09/28/2016   Chest CT Nov 2017   Tobacco abuse    Vitamin D deficiency disease     PAST SURGICAL HISTORY :   Past Surgical History:  Procedure Laterality Date   APPENDECTOMY  11/27/1972   IR REMOVAL TUN ACCESS W/ PORT W/O FL MOD SED  03/20/2022   PERICARDIOCENTESIS N/A 10/03/2021   Procedure: PERICARDIOCENTESIS;  Surgeon: Corky Crafts, MD;  Location: [facility] INVASIVE CV LAB;  Service: Cardiovascular;  Laterality: N/A;   PORTA CATH INSERTION N/A 06/06/2021   Procedure: PORTA CATH INSERTION;  Surgeon: Annice Needy, MD;  Location: [facility] INVASIVE CV LAB;  Service: Cardiovascular;  Laterality: N/A;   SHOULDER ARTHROSCOPY  10/23/2016   Procedure: ARTHROSCOPY SHOULDER WITH DISTAL CLAVICLE EXCISION, PARTIAL ACROMIONECTOMY,  AND DEBRIDEMENT;  Surgeon: Deeann Saint, MD;  Location: [facility] ORS;  Service: Orthopedics;;   TIBIA FRACTURE SURGERY Left 11/27/2002   titanium rod, ORIF   TRIGGER FINGER RELEASE Right 04/27/2023   rt ring finger/[facility]    FAMILY HISTORY :   Family History  Problem Relation Age of Onset   Heart disease Father    Osteoarthritis Father    Alzheimer's disease Father    Cancer Neg Hx    Diabetes Neg Hx    Hypertension Neg Hx    Stroke Neg Hx    COPD Neg Hx     SOCIAL HISTORY:   Social History   Tobacco Use   Smoking status: Every Day    Current packs/day: 0.50    Average packs/day: 0.5 packs/day for 45.0 years (22.5 ttl pk-yrs)    Types: Cigarettes   Smokeless tobacco: Never   Tobacco comments:    11/6- currently smoking 5 cigarettes a day   Vaping Use   Vaping status: Never Used  Substance Use Topics   Alcohol  use: Yes    Alcohol/week: 3.0 standard drinks of alcohol    Types: 3 Glasses of wine per week    Comment: a couple glasses of wine a week   Drug use: No    ALLERGIES:  is allergic to gluten meal.  MEDICATIONS:  Current Outpatient Medications  Medication Sig Dispense Refill   acalabrutinib maleate (CALQUENCE) 100 MG tablet Take 1 tablet (100mg ) by mouth daily in the morning and 1 tablet (100mg ) in evening on Mondays, Wednesdays, and Fridays. 40 tablet 2   cholecalciferol (VITAMIN D3) 25 MCG (1000 UNIT) tablet Take 1,000 Units by mouth daily.     Roflumilast (ZORYVE) 0.3 % CREA Apply 1 Application topically at bedtime. Qhs to psoriasis on legs and arms 60 g 6   No current facility-administered medications for this visit.    PHYSICAL EXAMINATION: ECOG PERFORMANCE STATUS: 0 - Asymptomatic  BP 121/84 (BP Location: Left Arm, Patient Position: Sitting, Cuff Size: Normal)   Pulse 88   Temp (!) 96.8 F (36 C) (Tympanic)   Ht 5\' 6"  (1.676 m)   Wt 165 lb 12.8 oz (75.2 kg)   SpO2 98%   BMI 26.76 kg/m   Filed Weights   08/08/23 1327  Weight: 165 lb 12.8 oz  (75.2 kg)    No significant lymphadenopathy lymphadenopathy in the neck/underarm.  Physical Exam HENT:     Head: Normocephalic and atraumatic.     Mouth/Throat:     Pharynx: No oropharyngeal exudate.  Eyes:     Pupils: Pupils are equal, round, and reactive to light.  Cardiovascular:     Rate and Rhythm: Normal rate and regular rhythm.  Pulmonary:     Effort: No respiratory distress.     Breath sounds: No wheezing.  Abdominal:     General: Bowel sounds are normal. There is no distension.     Palpations: Abdomen is soft. There is no mass.     Tenderness: There is no abdominal tenderness. There is no guarding or rebound.  Musculoskeletal:        General: No tenderness. Normal range of motion.     Cervical back: Normal range of motion and neck supple.  Skin:    General: Skin is warm.  Neurological:     Mental Status: He is alert and oriented to person, place, and time.  Psychiatric:        Mood and Affect: Affect normal.     LABORATORY DATA:  I have reviewed the data as listed    Component Value Date/Time   NA 135 08/08/2023 1320   NA 139 10/27/2021 0857   K 3.7 08/08/2023 1320   CL 106 08/08/2023 1320   CO2 23 08/08/2023 1320   GLUCOSE 105 (H) 08/08/2023 1320   BUN 9 08/08/2023 1320   BUN 16 10/27/2021 0857   CREATININE 0.88 08/08/2023 1320   CREATININE 0.84 10/06/2019 0000   CALCIUM 9.1 08/08/2023 1320   PROT 6.3 (L) 08/08/2023 1320   ALBUMIN 3.8 08/08/2023 1320   AST 19 08/08/2023 1320   ALT 22 08/08/2023 1320   ALKPHOS 68 08/08/2023 1320   BILITOT 0.6 08/08/2023 1320   GFRNONAA >60 08/08/2023 1320   GFRNONAA 91 10/06/2019 0000   GFRAA >60 12/26/2019 0842   GFRAA 106 10/06/2019 0000    No results found for: "SPEP", "UPEP"  Lab Results  Component Value Date   WBC 9.5 08/08/2023   NEUTROABS 6.8 08/08/2023   HGB 16.0 08/08/2023   HCT 46.3 08/08/2023  MCV 91.1 08/08/2023   PLT 242 08/08/2023      Chemistry      Component Value Date/Time   NA 135  08/08/2023 1320   NA 139 10/27/2021 0857   K 3.7 08/08/2023 1320   CL 106 08/08/2023 1320   CO2 23 08/08/2023 1320   BUN 9 08/08/2023 1320   BUN 16 10/27/2021 0857   CREATININE 0.88 08/08/2023 1320   CREATININE 0.84 10/06/2019 0000      Component Value Date/Time   CALCIUM 9.1 08/08/2023 1320   ALKPHOS 68 08/08/2023 1320   AST 19 08/08/2023 1320   ALT 22 08/08/2023 1320   BILITOT 0.6 08/08/2023 1320       RADIOGRAPHIC STUDIES: I have personally reviewed the radiological images as listed and agreed with the findings in the report. No results found.   ASSESSMENT & PLAN:   .CLL (chronic lymphocytic leukemia) (HCC) # SLL/CLL-patient currently on acalabrutinib 100 mg twice a day [since start of May 2023]- tolerating well except mild-moderate-severe fatigue/insominia [see below]. Continue acalbrutinib, but SKIP the PM dose T/Thr/Sat & Sun.  # Clinically, no evidence of any progression.  Labs within normal limits. JUNE 8th, 2024-  CT CAP- No pathologically enlarged lymph nodes within the chest, abdomen or pelvis and no splenomegaly- stable.   # Myalgias/Fatigue/ Insomnia: ? Related Acalabrutinib- Continue acalbrutinib, but SKIP the PM dose 4 days. s/p Exercise/CARE program-  stable.    # Diarrhea: G-1- ? Related to Acalabrutinib- monitor for now.   # Psoriasis/? Arthritis- relative contraindication with TNF-Alpha, but not absolute- on topical zorvye- stable.   # Active smoker: recommend quitting smoking; cutting down-   # Enlarged prostate- PSA 7.0 [MAY 2024-]- s/p urology evaluation [Dr. Sninski/Dr. Wolf's office]- awaiting trending PSA- and then considering prostate MRI or biopsy if remains concerning. Recommend continued follow up Urology.  # Secondary malignancies: dermatology [Dr.Stewart ]  # IV access: PIV  # Vaccines: ok with shingrix # [first in April, 2023];  S/p-flu/RSV/Pneumoanis vaccines; s/p covid.   # DISPOSITION: # follow up in 3  months MD; labs- cbc/cmp; URIC  ACID; LDH;mag;  Dr.B   No orders of the defined types were placed in this encounter.      Earna Coder, MD 08/08/2023 1:57 PM

## 2023-08-08 NOTE — Assessment & Plan Note (Addendum)
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 Continue acalbrutinib, but SKIP the PM dose T/Thr/Sat & Sun.  # Clinically, no evidence of any progression.  Labs within normal limits. JUNE 8th, 2024-  CT CAP- No pathologically enlarged lymph nodes within the chest, abdomen or pelvis and no splenomegaly- stable.   # Myalgias/Fatigue/ Insomnia: ? Related Acalabrutinib- Continue acalbrutinib, but SKIP the PM dose 4 days. s/p Exercise/CARE program-  stable.    # Diarrhea: G-1- ? Related to Acalabrutinib- monitor for now.   # Psoriasis/? Arthritis- relative contraindication with TNF-Alpha, but not absolute- on topical zorvye- stable.   # Active smoker: recommend quitting smoking; cutting down-   # Enlarged prostate- PSA 7.0 [MAY 2024-]- s/p urology evaluation [Dr. Sninski/Dr. Wolf's office]- awaiting trending PSA- and then considering prostate MRI or biopsy if remains concerning. Recommend continued follow up Urology.  # Secondary malignancies: dermatology [Dr.Stewart ]  # IV access: PIV  # Vaccines: ok with shingrix # [first in April, 2023];  S/p-flu/RSV/Pneumoanis vaccines; s/p covid.   # DISPOSITION: # follow up in 3  months MD; labs- cbc/cmp; URIC ACID; LDH;mag;  Dr.B

## 2023-08-08 NOTE — Progress Notes (Signed)
Had a trigger finger release at [facility] 6/24.

## 2023-08-10 ENCOUNTER — Other Ambulatory Visit: Payer: Self-pay

## 2023-08-14 ENCOUNTER — Other Ambulatory Visit: Payer: Self-pay

## 2023-08-20 ENCOUNTER — Other Ambulatory Visit
Admission: RE | Admit: 2023-08-20 | Discharge: 2023-08-20 | Disposition: A | Discharge status: Home / Self Care | Payer: Medicare PPO | Attending: Urology | Admitting: Urology

## 2023-08-20 DIAGNOSIS — R972 Elevated prostate specific antigen [PSA]: Secondary | ICD-10-CM | POA: Insufficient documentation

## 2023-08-21 LAB — PSA, TOTAL AND FREE
PSA, Free Pct: 12.4 %
PSA, Free: 0.72 ng/mL
Prostate Specific Ag, Serum: 5.8 ng/mL — ABNORMAL HIGH (ref 0.0–4.0)

## 2023-08-22 ENCOUNTER — Ambulatory Visit: Payer: TRICARE For Life (TFL) | Admitting: Urology

## 2023-08-22 ENCOUNTER — Other Ambulatory Visit: Payer: Self-pay

## 2023-08-23 ENCOUNTER — Other Ambulatory Visit: Payer: Self-pay | Admitting: Internal Medicine

## 2023-08-23 ENCOUNTER — Other Ambulatory Visit: Payer: Self-pay

## 2023-08-23 ENCOUNTER — Other Ambulatory Visit (HOSPITAL_COMMUNITY): Payer: Self-pay

## 2023-08-23 DIAGNOSIS — C9110 Chronic lymphocytic leukemia of B-cell type not having achieved remission: Secondary | ICD-10-CM

## 2023-08-23 MED ORDER — CALQUENCE 100 MG PO TABS
ORAL_TABLET | ORAL | 2 refills | Status: DC
Start: 2023-08-23 — End: 2023-11-13
  Filled 2023-08-23: qty 40, 28d supply, fill #0
  Filled 2023-09-19: qty 40, 28d supply, fill #1
  Filled 2023-10-09: qty 40, 28d supply, fill #2

## 2023-08-23 NOTE — Progress Notes (Signed)
Specialty Pharmacy Refill Coordination Note  Andres Mullins is a 72 y.o. male contacted today regarding refills of specialty medication(s) Acalabrutinib Maleate .  Patient requested Delivery  on 08/27/23  to verified address [address], [address]   Medication will be filled on 08/24/2023.

## 2023-08-27 ENCOUNTER — Encounter: Payer: Self-pay | Admitting: Nurse Practitioner

## 2023-08-27 ENCOUNTER — Ambulatory Visit (INDEPENDENT_AMBULATORY_CARE_PROVIDER_SITE_OTHER): Payer: TRICARE For Life (TFL) | Admitting: Nurse Practitioner

## 2023-08-27 VITALS — BP 118/64 | HR 103 | Temp 97.7°F | Resp 18 | Ht 66.0 in | Wt 160.5 lb

## 2023-08-27 DIAGNOSIS — J0140 Acute pansinusitis, unspecified: Secondary | ICD-10-CM

## 2023-08-27 DIAGNOSIS — J069 Acute upper respiratory infection, unspecified: Secondary | ICD-10-CM

## 2023-08-27 MED ORDER — AMOXICILLIN-POT CLAVULANATE 875-125 MG PO TABS
1.0000 | ORAL_TABLET | Freq: Two times a day (BID) | ORAL | 0 refills | Status: DC
Start: 2023-08-27 — End: 2023-08-28

## 2023-08-27 MED ORDER — HYDROCOD POLI-CHLORPHE POLI ER 10-8 MG/5ML PO SUER: 5.0000 mL | Freq: Two times a day (BID) | ORAL | 0 refills | Status: DC | PRN

## 2023-08-27 NOTE — Progress Notes (Signed)
BP 118/64   Pulse (!) 103   Temp 97.7 F (36.5 C)   Resp 18   Ht 5\' 6"  (1.676 m)   Wt 160 lb 8 oz (72.8 kg)   SpO2 98%   BMI 25.91 kg/m    Subjective:    Patient ID: Andres Mullins, male    DOB: 1951-09-30, 72 y.o.   MRN: [id number]  HPI: Andres Mullins is a 72 y.o. male  Chief Complaint  Patient presents with   Sinusitis    Onset x2 weeks has tested neg x3 for covid.  Symptoms include: cough runny nose some boday aches   URI Compliant/sinusitis: symptoms started 2 weeks ago, he says initially it felt like allergies,  he says it has continued to get worse.  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  He has tested negative for covid.  -Fever: low grade -Cough: yes -Shortness of breath: no -Wheezing: no -Chest congestion: yes -Nasal congestion: yes -Runny nose: yes -Post nasal drip: yes -Sore throat: no, itchy throat -Sinus pressure: yes -Headache: yes -Face pain: yes -Ear pain:  no -Ear pressure: no -Relief with OTC cold/cough medications: affrain, Claritin, tylenol, benadryl   Recommend taking zyrtec, flonase, mucinex, vitamin d, vitamin c, and zinc. Push fluids and get rest.    Will send in augmentin for sinus infection and tussinex for cough.   Relevant past medical, surgical, family and social history reviewed and updated as indicated. Interim medical history since our last visit reviewed. Allergies and medications reviewed and updated.  Review of Systems Ten systems reviewed and is negative except as mentioned in HPI       Objective:    BP 118/64   Pulse (!) 103   Temp 97.7 F (36.5 C)   Resp 18   Ht 5\' 6"  (1.676 m)   Wt 160 lb 8 oz (72.8 kg)   SpO2 98%   BMI 25.91 kg/m   Wt Readings from Last 3 Encounters:  08/27/23 160 lb 8 oz (72.8 kg)  08/08/23 165 lb 12.8 oz (75.2 kg)  05/09/23 167 lb 3.2 oz (75.8 kg)    Physical Exam  Constitutional: Patient appears well-developed and well-nourished. No distress.  HEENT: head atraumatic, normocephalic, pupils equal and  reactive to light, ears Tms clear, neck supple, throat within normal limits, facial tenderness, no lymphadenopathy  Cardiovascular: Normal rate, regular rhythm and normal heart sounds.  No murmur heard. No BLE edema. Pulmonary/Chest: Effort normal and breath sounds normal. No respiratory distress. Abdominal: Soft.  There is no tenderness. Psychiatric: Patient has a normal mood and affect. behavior is normal. Judgment and thought content normal.  Results for orders placed or performed during the hospital encounter of 08/20/23  PSA, total and free  Result Value Ref Range   PSA, Free 0.72 N/A ng/mL   PSA, Free Pct 12.4 %   Prostate Specific Ag, Serum 5.8 (H) 0.0 - 4.0 ng/mL      Assessment & Plan:   Problem List Items Addressed This Visit   None Visit Diagnoses     Viral upper respiratory tract infection    -  Primary   push fluids, will treat for sinus infection   Relevant Medications   amoxicillin-clavulanate (AUGMENTIN) 875-125 MG tablet   chlorpheniramine-HYDROcodone (TUSSIONEX) 10-8 MG/5ML   Acute non-recurrent pansinusitis       push fluids, will treat for sinus infection due to duration of sinus pressure.   Relevant Medications   amoxicillin-clavulanate (AUGMENTIN) 875-125 MG tablet   chlorpheniramine-HYDROcodone (  TUSSIONEX) 10-8 MG/5ML        Follow up plan: Return if symptoms worsen or fail to improve.

## 2023-08-28 ENCOUNTER — Other Ambulatory Visit: Payer: Self-pay | Admitting: Nurse Practitioner

## 2023-08-28 ENCOUNTER — Ambulatory Visit: Payer: Self-pay | Admitting: *Deleted

## 2023-08-28 DIAGNOSIS — J0140 Acute pansinusitis, unspecified: Secondary | ICD-10-CM

## 2023-08-28 MED ORDER — AZITHROMYCIN 250 MG PO TABS
ORAL_TABLET | ORAL | 0 refills | Status: AC
Start: 2023-08-28 — End: 2023-09-02

## 2023-08-28 NOTE — Telephone Encounter (Signed)
  Chief Complaint: Augmentin prescribed yesterday 9/30 by Della Goo, FNP is causing diarrhea and vomiting Symptoms: Abd started hurting then started having diarrhea then vomiting after starting the Augmentin for a sinus infection. Frequency: Last diarrhea was 7:30 this morning.  No vomiting so far this morning but doesn't feel like eating any thing.    Is drinking fluids ok. Pertinent Negatives: Patient denies fever.    Disposition: [] ED /[] Urgent Care (no appt availability in office) / [] Appointment(In office/virtual)/ []  [facility]/ [] Home Care/ [] Refused Recommended Disposition /[] [facility]/ [x]  Follow-up with PCP Additional Notes: A message has been sent to Della Goo, FNP.   Pt. Is holding off on his regular medications for now so "They don't go to waste".

## 2023-08-28 NOTE — Telephone Encounter (Signed)
FYI

## 2023-08-28 NOTE — Telephone Encounter (Signed)
Patient notified

## 2023-08-28 NOTE — Telephone Encounter (Signed)
Message from Montrose H sent at 08/28/2023  8:08 AM EDT  Summary: abx issues   Pt saw Raynelle Fanning yesterday and prescribed an abx, was told to calll i he had any issue.  He states he does.  He cannot anymore.  It has caused diarrhea and vomiting.          Call History  Contact Date/Time Type Contact Phone/Fax User  08/28/2023 08:04 AM EDT Phone (Incoming) Brayton Caves "Drayton" (Self) [phone] Rexene Edison) Crist Infante   Reason for Disposition  Patient wants to stop the antibiotic    The Augmentin is causing diarrhea and vomiting.  Answer Assessment - Initial Assessment Questions 1. ANTIBIOTIC: "What antibiotic are you taking?" "How many times per day?"     Augmentin prescribed 08/27/2023 for sinus infection.   I'm having diarrhea and vomiting.     2. ANTIBIOTIC ONSET: "When was the antibiotic started?"     Yesterday 08/27/2023 3. DIARRHEA SEVERITY: "How bad is the diarrhea?" "How many more stools have you had in the past 24 hours than normal?"    - NO DIARRHEA (SCALE 0)   - MILD (SCALE 1-3): Few loose or mushy BMs; increase of 1-3 stools over normal daily number of stools; mild increase in ostomy output.   -  MODERATE (SCALE 4-7): Increase of 4-6 stools daily over normal; moderate increase in ostomy output. * SEVERE (SCALE 8-10; OR 'WORST POSSIBLE'): Increase of 7 or more stools daily over normal; moderate increase in ostomy output; incontinence.     Last night 7:00  PM my stomach was hurting. 4. ONSET: "When did the diarrhea begin?"      7:00 PM last night  5. BM CONSISTENCY: "How loose or watery is the diarrhea?"      Diarrhea   Last diarrhea this morning around 7:30 AM 6. VOMITING: "Are you also vomiting?" If Yes, ask: "How many times in the past 24 hours?"      Yes but not this morning so far. 7. ABDOMEN PAIN: "Are you having any abdomen pain?" If Yes, ask: "What does it feel like?" (e.g., crampy, dull, intermittent, constant)      Yes my stomach started hurting last night and then the  diarrhea started. 8. ABDOMEN PAIN SEVERITY: If present, ask: "How bad is the pain?"  (e.g., Scale 1-10; mild, moderate, or severe)   - MILD (1-3): doesn't interfere with normal activities, abdomen soft and not tender to touch    - MODERATE (4-7): interferes with normal activities or awakens from sleep, abdomen tender to touch    - SEVERE (8-10): excruciating pain, doubled over, unable to do any normal activities       Mild before having the diarrhea 9. ORAL INTAKE: If vomiting, "Have you been able to drink liquids?" "How much liquids have you had in the past 24 hours?"     I'm drinking fluids ok. 10. HYDRATION: "Any signs of dehydration?" (e.g., dry mouth [not just dry lips], too weak to stand, dizziness, new weight loss) "When did you last urinate?"       No 11. EXPOSURE: "Have you traveled to a foreign country recently?" "Have you been exposed to anyone with diarrhea?" "Could you have eaten any food that was spoiled?"       N/A 12. OTHER SYMPTOMS: "Do you have any other symptoms?" (e.g., fever, blood in stool)       Just the diarrhea and vomiting but the vomiting has stopped.  The Augmentin was helping.   I'm  feeling better as far as the sinus infection and all.   13. PREGNANCY: "Is there any chance you are pregnant?" "When was your last menstrual period?"       N/A  Protocols used: Diarrhea on Antibiotics-A-AH

## 2023-08-29 ENCOUNTER — Encounter: Payer: Self-pay | Admitting: Urology

## 2023-08-29 ENCOUNTER — Ambulatory Visit: Payer: Medicare PPO | Admitting: Urology

## 2023-08-29 VITALS — BP 122/77 | HR 86 | Ht 66.0 in | Wt 161.4 lb

## 2023-08-29 DIAGNOSIS — R972 Elevated prostate specific antigen [PSA]: Secondary | ICD-10-CM

## 2023-08-29 NOTE — Progress Notes (Signed)
08/29/2023 1:05 PM   Andres Mullins 1951/09/06 [id number]  Reason for visit: Follow up elevated PSA  HPI: 72 year old male with CLL followed by oncology who I originally saw in June 2024 for elevated PSA of 7.05 from May 2024.  He reportedly has a history of prostate cancer in his father treated at age 65 with surgery, though sounds like that may have been low risk disease, and he did not ultimately die of prostate cancer.  Patient reportedly underwent a prostate biopsy about 10 years ago that showed inflammation, those records are not available to me, and unclear what his PSA was at that time.  Prostate measured 58 g on prior CT from June 2024.  Using shared decision making, he opted for a repeat PSA in 3 months with reflex to free.Most recent PSA down slightly to 5.8(12.4% free) from 08/20/2023.  PSA density 0.1.  We had a long conversation about the risk and benefits of PSA screening, as well as potential options moving forward.  We discussed options including repeat PSA with reflex to free in 6 months, 4K score, prostate MRI, or prostate biopsy.  Ultimately, he opted for a 4K score in 6 months, and understands potential need for more additional treatments like MRI or biopsy if high risk of detecting clinically significant prostate cancer.  RTC 6 months with 4K score prior   Sondra Come, MD  [facility] [address], [address] [place], [place] [address] [phone]

## 2023-08-29 NOTE — Patient Instructions (Signed)

## 2023-08-30 ENCOUNTER — Encounter: Payer: Self-pay | Admitting: Internal Medicine

## 2023-08-30 ENCOUNTER — Other Ambulatory Visit: Payer: Self-pay

## 2023-09-14 ENCOUNTER — Ambulatory Visit: Payer: Medicare PPO | Admitting: Family Medicine

## 2023-09-14 VITALS — BP 120/68 | HR 103 | Temp 98.4°F | Resp 16 | Ht 66.0 in | Wt 161.0 lb

## 2023-09-14 DIAGNOSIS — R052 Subacute cough: Secondary | ICD-10-CM | POA: Diagnosis not present

## 2023-09-14 DIAGNOSIS — J329 Chronic sinusitis, unspecified: Secondary | ICD-10-CM

## 2023-09-14 MED ORDER — ALBUTEROL SULFATE HFA 108 (90 BASE) MCG/ACT IN AERS
2.0000 | INHALATION_SPRAY | Freq: Four times a day (QID) | RESPIRATORY_TRACT | 0 refills | Status: DC | PRN
Start: 2023-09-14 — End: 2023-11-07

## 2023-09-14 MED ORDER — PROMETHAZINE-DM 6.25-15 MG/5ML PO SYRP
2.5000 mL | ORAL_SOLUTION | Freq: Four times a day (QID) | ORAL | 0 refills | Status: DC | PRN
Start: 2023-09-14 — End: 2023-11-07

## 2023-09-14 MED ORDER — LORATADINE 10 MG PO TABS
10.0000 mg | ORAL_TABLET | Freq: Every day | ORAL | 11 refills | Status: DC
Start: 2023-09-14 — End: 2023-11-07

## 2023-09-14 NOTE — Progress Notes (Unsigned)
Patient ID: Andres Mullins, male    DOB: 1951-03-25, 72 y.o.   MRN: [id number]  PCP: Margarita Mail, DO  Chief Complaint  Patient presents with   Cough    Non-productive    Subjective:   Andres Mullins is a 72 y.o. male, presents to clinic with CC of the following:  HPI  Pt with some return of nasal drainage and coughing after URI illness tx more aggressively about 3 weeks ago, he is mostly feeling better, but still having some coughing fits here and there, notes its dry, feels like a tickle and post nasal drip He denies CP SOB, fever sweats chills  The tussionex is very effective to suppress cough His wife is concerned and annoyed that he is still coughing  Previuosly tried afrin nose spray, robittusin   Patient Active Problem List   Diagnosis Date Noted   Snoring 01/31/2023   Nicotine dependence 01/31/2023   Pleural effusion due to CHF (congestive heart failure) (HCC) 01/17/2022   Fever with chills 01/17/2022   Acute diastolic CHF (congestive heart failure) (HCC) 01/16/2022   History of pericarditis 01/16/2022   Tobacco use disorder, continuous 12/19/2021   Pulmonary embolism (HCC) 10/16/2021   Sepsis due to pneumonia (HCC) 10/03/2021   Pericardial effusion 10/03/2021   Oropharyngeal candidiasis 10/03/2021   Hyponatremia 10/03/2021   Acute hypoxemic respiratory failure (HCC) 10/03/2021   Cardiac tamponade 10/03/2021   CLL (chronic lymphocytic leukemia) (HCC) 10/29/2019   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   Welcome to Medicare preventive visit 11/15/2018   Renal lesion 11/12/2018   Prostate enlargement 11/12/2018   Acquired trigger finger 11/12/2018   Trigger middle finger of right hand 01/02/2018   Positive anti-CCP test 12/12/2017   Rheumatoid factor positive 12/12/2017   Trigger finger of both hands 12/12/2017   Bilateral hand pain 05/18/2017   Preventative health care 05/18/2017   Screen for colon cancer 05/18/2017   Coronary artery  calcification 01/18/2017   Supraspinatus tendinitis, left 10/22/2016   Impingement syndrome of shoulder region 10/06/2016   Celiac disease 10/05/2016   Atherosclerosis of coronary artery 09/28/2016   Thoracic aortic atherosclerosis (HCC) 09/28/2016   Personal history of tobacco use, presenting hazards to health 09/26/2016   Lipoma of head 10/19/2015   Tendonitis, Achilles, left 10/14/2015   Herpes    Rosacea    Psoriasis    Hyperlipidemia    Vitamin D deficiency disease    Tobacco abuse    Elevated PSA    Elevated C-reactive protein (CRP)       Current Outpatient Medications:    acalabrutinib maleate (CALQUENCE) 100 MG tablet, Take 1 tablet (100mg ) by mouth daily in the morning and 1 tablet (100mg ) in evening on Mondays, Wednesdays, and Fridays., Disp: 40 tablet, Rfl: 2   acalabrutinib maleate (CALQUENCE) 100 MG tablet, Take 1 tablet (100mg ) by mouth daily in the morning and 1 tablet (100mg ) in evening on Mondays, Wednesdays, and Fridays., Disp: 40 tablet, Rfl: 2   chlorpheniramine-HYDROcodone (TUSSIONEX) 10-8 MG/5ML, Take 5 mLs by mouth every 12 (twelve) hours as needed for cough., Disp: 115 mL, Rfl: 0   cholecalciferol (VITAMIN D3) 25 MCG (1000 UNIT) tablet, Take 1,000 Units by mouth daily., Disp: , Rfl:    Roflumilast (ZORYVE) 0.3 % CREA, Apply 1 Application topically at bedtime. Qhs to psoriasis on legs and arms, Disp: 60 g, Rfl: 6   Allergies  Allergen Reactions   Gluten Meal     Gluten intolerance  Social History   Tobacco Use   Smoking status: Every Day    Current packs/day: 0.50    Average packs/day: 0.5 packs/day for 45.0 years (22.5 ttl pk-yrs)    Types: Cigarettes   Smokeless tobacco: Never   Tobacco comments:    11/6- currently smoking 5 cigarettes a day   Vaping Use   Vaping status: Never Used  Substance Use Topics   Alcohol use: Yes    Alcohol/week: 3.0 standard drinks of alcohol    Types: 3 Glasses of wine per week    Comment: a couple glasses  of wine a week   Drug use: No      Chart Review Today: ***  Review of Systems     Objective:   Vitals:   09/14/23 1100  BP: 120/68  Pulse: (!) 103  Resp: 16  Temp: 98.4 F (36.9 C)  SpO2: 99%  Weight: 161 lb (73 kg)  Height: 5\' 6"  (1.676 m)    Body mass index is 25.99 kg/m.  Physical Exam   Results for orders placed or performed during the hospital encounter of 08/20/23  PSA, total and free  Result Value Ref Range   PSA, Free 0.72 N/A ng/mL   PSA, Free Pct 12.4 %   Prostate Specific Ag, Serum 5.8 (H) 0.0 - 4.0 ng/mL       Assessment & Plan:   ***     Danelle Berry, PA-C 09/14/23 11:13 AM

## 2023-09-19 ENCOUNTER — Other Ambulatory Visit: Payer: Self-pay

## 2023-09-19 NOTE — Progress Notes (Signed)
Specialty Pharmacy Refill Coordination Note  Andres Mullins is a 72 y.o. male contacted today regarding refills of specialty medication(s) Acalabrutinib Maleate   Patient requested Delivery   Delivery date: 09/24/23   Verified address: [address]  [place] [address]   Medication will be filled on 09/21/23.

## 2023-10-03 ENCOUNTER — Other Ambulatory Visit: Payer: Self-pay

## 2023-10-08 ENCOUNTER — Ambulatory Visit: Payer: Medicare PPO | Admitting: Dermatology

## 2023-10-08 ENCOUNTER — Other Ambulatory Visit: Payer: Self-pay

## 2023-10-09 ENCOUNTER — Other Ambulatory Visit (HOSPITAL_COMMUNITY): Payer: Self-pay

## 2023-10-09 NOTE — Progress Notes (Signed)
Specialty Pharmacy Ongoing Clinical Assessment Note  Andres Mullins is a 72 y.o. male who is being followed by the specialty pharmacy service for RxSp Oncology   Patient's specialty medication(s) reviewed today: Acalabrutinib Maleate   Missed doses in the last 4 weeks: 0   Patient/Caregiver did not have any additional questions or concerns.   Therapeutic benefit summary: Patient is achieving benefit   Adverse events/side effects summary: No adverse events/side effects (diarrhea has resolved, headaches are controlled)   Patient's therapy is appropriate to: Continue    Goals Addressed             This Visit's Progress    Slow Disease Progression       Patient is on track. Patient will maintain adherence. Stable disease with no evidence of progression per office visit on 08/08/23.         Follow up:  6 months  Servando Snare Specialty Pharmacist

## 2023-10-09 NOTE — Progress Notes (Signed)
Specialty Pharmacy Refill Coordination Note  Andres Mullins is a 72 y.o. male contacted today regarding refills of specialty medication(s) Acalabrutinib Maleate   Patient requested Delivery   Delivery date: 10/19/23   Verified address: [address]  [place] [address]   Medication will be filled on 10/18/23.

## 2023-11-07 ENCOUNTER — Encounter: Payer: Self-pay | Admitting: Internal Medicine

## 2023-11-07 ENCOUNTER — Inpatient Hospital Stay (HOSPITAL_BASED_OUTPATIENT_CLINIC_OR_DEPARTMENT_OTHER): Payer: Medicare PPO | Admitting: Internal Medicine

## 2023-11-07 ENCOUNTER — Inpatient Hospital Stay: Payer: Medicare PPO | Attending: Internal Medicine

## 2023-11-07 VITALS — BP 115/76 | HR 93 | Temp 97.8°F | Resp 18 | Wt 167.8 lb

## 2023-11-07 DIAGNOSIS — M791 Myalgia, unspecified site: Secondary | ICD-10-CM | POA: Insufficient documentation

## 2023-11-07 DIAGNOSIS — Z818 Family history of other mental and behavioral disorders: Secondary | ICD-10-CM | POA: Diagnosis not present

## 2023-11-07 DIAGNOSIS — Z86711 Personal history of pulmonary embolism: Secondary | ICD-10-CM | POA: Diagnosis not present

## 2023-11-07 DIAGNOSIS — M2550 Pain in unspecified joint: Secondary | ICD-10-CM | POA: Insufficient documentation

## 2023-11-07 DIAGNOSIS — M1990 Unspecified osteoarthritis, unspecified site: Secondary | ICD-10-CM | POA: Diagnosis not present

## 2023-11-07 DIAGNOSIS — J449 Chronic obstructive pulmonary disease, unspecified: Secondary | ICD-10-CM | POA: Insufficient documentation

## 2023-11-07 DIAGNOSIS — Z9049 Acquired absence of other specified parts of digestive tract: Secondary | ICD-10-CM | POA: Diagnosis not present

## 2023-11-07 DIAGNOSIS — Z79899 Other long term (current) drug therapy: Secondary | ICD-10-CM | POA: Diagnosis not present

## 2023-11-07 DIAGNOSIS — C9110 Chronic lymphocytic leukemia of B-cell type not having achieved remission: Secondary | ICD-10-CM

## 2023-11-07 DIAGNOSIS — G479 Sleep disorder, unspecified: Secondary | ICD-10-CM | POA: Diagnosis not present

## 2023-11-07 DIAGNOSIS — Z8261 Family history of arthritis: Secondary | ICD-10-CM | POA: Diagnosis not present

## 2023-11-07 DIAGNOSIS — F17210 Nicotine dependence, cigarettes, uncomplicated: Secondary | ICD-10-CM | POA: Diagnosis not present

## 2023-11-07 DIAGNOSIS — E785 Hyperlipidemia, unspecified: Secondary | ICD-10-CM | POA: Insufficient documentation

## 2023-11-07 DIAGNOSIS — I2510 Atherosclerotic heart disease of native coronary artery without angina pectoris: Secondary | ICD-10-CM | POA: Insufficient documentation

## 2023-11-07 DIAGNOSIS — Z8249 Family history of ischemic heart disease and other diseases of the circulatory system: Secondary | ICD-10-CM | POA: Diagnosis not present

## 2023-11-07 LAB — LACTATE DEHYDROGENASE: LDH: 108 U/L (ref 98–192)

## 2023-11-07 LAB — CMP (CANCER CENTER ONLY)
ALT: 26 U/L (ref 0–44)
AST: 19 U/L (ref 15–41)
Albumin: 3.7 g/dL (ref 3.5–5.0)
Alkaline Phosphatase: 69 U/L (ref 38–126)
Anion gap: 6 (ref 5–15)
BUN: 10 mg/dL (ref 8–23)
CO2: 25 mmol/L (ref 22–32)
Calcium: 9.3 mg/dL (ref 8.9–10.3)
Chloride: 107 mmol/L (ref 98–111)
Creatinine: 0.83 mg/dL (ref 0.61–1.24)
GFR, Estimated: >60 mL/min (ref >60)
Glucose, Bld: 128 mg/dL — ABNORMAL HIGH (ref 70–99)
Potassium: 4.1 mmol/L (ref 3.5–5.1)
Sodium: 138 mmol/L (ref 135–145)
Total Bilirubin: 0.6 mg/dL (ref <1.2)
Total Protein: 6.4 g/dL — ABNORMAL LOW (ref 6.5–8.1)

## 2023-11-07 LAB — CBC WITH DIFFERENTIAL (CANCER CENTER ONLY)
Abs Immature Granulocytes: 0.08 10*3/uL — ABNORMAL HIGH (ref 0.00–0.07)
Basophils Absolute: 0.1 10*3/uL (ref 0.0–0.1)
Basophils Relative: 1 %
Eosinophils Absolute: 0.2 10*3/uL (ref 0.0–0.5)
Eosinophils Relative: 1 %
HCT: 46.6 % (ref 39.0–52.0)
Hemoglobin: 16.2 g/dL (ref 13.0–17.0)
Immature Granulocytes: 1 %
Lymphocytes Relative: 15 %
Lymphs Abs: 1.8 10*3/uL (ref 0.7–4.0)
MCH: 31.5 pg (ref 26.0–34.0)
MCHC: 34.8 g/dL (ref 30.0–36.0)
MCV: 90.7 fL (ref 80.0–100.0)
Monocytes Absolute: 0.8 10*3/uL (ref 0.1–1.0)
Monocytes Relative: 7 %
Neutro Abs: 9.1 10*3/uL — ABNORMAL HIGH (ref 1.7–7.7)
Neutrophils Relative %: 75 %
Platelet Count: 251 10*3/uL (ref 150–400)
RBC: 5.14 MIL/uL (ref 4.22–5.81)
RDW: 13.7 % (ref 11.5–15.5)
WBC Count: 12.1 10*3/uL — ABNORMAL HIGH (ref 4.0–10.5)
nRBC: 0 % (ref 0.0–0.2)

## 2023-11-07 LAB — URIC ACID: Uric Acid, Serum: 5.8 mg/dL (ref 3.7–8.6)

## 2023-11-07 LAB — MAGNESIUM: Magnesium: 1.9 mg/dL (ref 1.7–2.4)

## 2023-11-07 NOTE — Progress Notes (Signed)
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   AUG 2021-Head and neck: The brain demonstrates symmetric appearing diffuse physiologic uptake without focal finding to suggest abnormality. In the left posterior and medial maxillary cavity, there is an area of mucoid deposition seen within SUV max of 28.8. This was seen in the previous study of November. The specific bony erosion, clear-cut soft tissue lesion is not detected. This probably represents a chronic sinus disease focus. The bilateral optic nerves and extraocular muscles to the largest nodes are seen on axial image 98. 1 now demonstrates a short axis measurement of 3.3 cm and the other more medially, a short axis measurement of 2.5 cm. Demonstrate symmetric increased uptake. The left, much greater than right, jugulodigastric and supraclavicular nodes have increased significantly in size and avidity since the previous study. Both of these lymph nodes demonstrate an SUV max of 3.9. No largest lymph node in the right subclavicular space is seen on axial image 104, demonstrating an SUV max of 3.3, and a short axis measurement of 1.4 cm.   On axial image 102, a right  paratracheal lymph node is again identified with increased avidity, located immediately posterior to the right thyroid pole. This area was noted previously, but now increased avidity is seen, with an SUV max of 8.6 in this area. Multiple smaller, and less avid lymph nodes scattered throughout the left greater than right neck are seen, as well as supraclavicular and infraclavicular areas.   Thorax: Significant axillary lymph nodes have increased significantly in size. These are also left greater than right, with the largest on the left seen on image #114 with a short axis measurement of 2.2 cm and an SUV max of 4.3. The largest on the right as seen on image 111, demonstrating a short axis of 1.4 cm and an SUV max of 4.0. Too numerous to count smaller and similarly avid lymph nodes in both axillae are also noted. These are all larger than previously seen and in general, more avid.  Numerous small AP window and lesser mediastinal lymph nodes are seen with minimal increased avidity, appearing similar in size and avidity to the previous study. No other significant mediastinal adenopathy. No hilar adenopathy detected. These likely also represent lymphoproliferative disease.  Hyperinflation of COPD, bibasilar dependent atelectasis, some paucity of lung tissue and biapical scarring are noted without other focal pulmonary mass or nodule, or acute pulmonary parenchymal disease. Physiologic uptake in the left heart is seen, as expected. Small hiatal hernia is again identified.   Abdomen pelvis: Physiologic uptake in the liver, gut and urinary tract is again noted. No focal area of asymmetric uptake in the structures is seen. The spleen also demonstrates generalized increased uptake and stable splenomegaly, up to 14.2 cm. On image 198, a preaortic lymph node is detected of  increased size since the previous study, now measuring up to 1.2 cm short axis.  Several large lymph nodes on image 203 of the mesentery are noted.  The largest of these, on image 201 and ureters up to 1.8 cm short axis. Despite the fact that measurable avidity is not detected in these are the surrounding lymph nodes, these are doubtless involved in the lymphoproliferative disease described, and appear larger. Multiple smaller lymph nodes scattered throughout the mesentery are also seen.  Grossly enlarged right greater than left common iliac chain lymph nodes are again seen, throughout the pelvis and into the inguinal areas bilaterally. The most avid on the right is seen on image #260 to, demonstrates a short axis of 2.5 cm, and an SUV max of 4.9. The largest on the left is identified image #272, demonstrates a short axis of 2.0 cm, and an SUV max of 4.7. These are similar to the previously noted values.  Right greater than left inguinal adenopathy is seen, with the largest lymph node seen on the right image 276, demonstrating a short axis of 1.5 cm and an SUV max of 3.9. This is larger in size and avidity than previously seen. There are bilateral enlarged lymph nodes associated with the external iliac vessels on image 277. These measure 2.0 cm on the right in short axis, with an SUV max of 3.9.  On the left, image 278, the external iliac lymph node is larger at 1.8 cm short axis, but with an SUV max of only 3.8. Lesser avid and smaller lymph nodes on both sides are also detected.   Musculoskeletal: Scattered, insignificant uptake is detected in the muscles especially in the bilateral joints, without focal finding to suggest lymphoproliferative disease.   IMPRESSIONS:  Significant increase in numbers, short axis size, and to a large extent, avidity -in the lymph nodes of the left greater than right neck, supraclavicular and infraclavicular areas, bilateral axillary lymph nodes and common iliac chain, external iliac chain and inguinal lymph nodes, consistent with worsening lymphoproliferative disease. The mediastinal lymph nodes seen are still present and  unchanged in size or [significantly] in avidity.    [place]- Bx- SLL/CLL.July 2022-Discussed with Dr. Asquith-oncologist, [place]-reviewed the pathology of the lymph node biopsy in 2021-positive for SLL; negative for cyclin D1/1114 translocation.    # July 20th, 2022- GAZYVA #1 Stann Mainland plan ading VENATOCLAX after 2 cycles/de-bulking] s/p GAYZYVA [x6]- Feb 2023 - cycle #1  Venatoclax- x1 month- HELD sec to repeated hospitalization  # DISCONTINUED Erlene Senters [sec to intolearnce]  # PE acute [NOV 2022] provoked--currently s/p  Eliquis x 6 months [end of  April 2023].    # MAY 1st, 2023- START ACALABRUTINIB 100 mg BID  # December 2022-pericarditis/effusion needing pericardiocentesis; FEB 2023-acute respiratory failure-hospitalization-? Etiology [s/p ID; Pul; Dr.A; cards- [facility]]  # Lung cancer screening- on [facility]  # SURVIVORSHIP:   # GENETICS:   DIAGNOSIS:   STAGE:         ;  GOALS:  CURRENT/MOST RECENT THERAPY :       CLL (chronic lymphocytic leukemia) (HCC)  10/29/2019 Initial Diagnosis   CLL (chronic lymphocytic leukemia) (HCC)   06/15/2021 -  Chemotherapy   Patient is on Treatment Plan : LYMPHOMA CLL/SLL Venetoclax + Obinutuzumab q28d      INTERVAL HISTORY: Ambulating independently.  Alone.   A pleasant 72 year-old male patient with CLL/SLL currently most currently on acalabrutinib daily [except weekend PMs]  is here for follow-up.  Patient overall feeling well. Sleep disturbance with  the medication ,Calquence and at times can cause loose stills intermittently. Energy is not what it once was.   Patient continues to intermittent chronic cough, poss post nasal drip. Denies any wheezing.  Denies any worsening cough.  No recent hospitalization.  Otherwise denies any lumps or bumps.  No fevers.  No chills.    Review of Systems  Constitutional:  Positive for malaise/fatigue. Negative for chills, diaphoresis, fever and weight loss.  HENT:  Negative for nosebleeds and sore throat.    Eyes:  Negative for double vision.  Respiratory:  Negative for cough, hemoptysis, sputum production, shortness of breath and wheezing.   Cardiovascular:  Negative for chest pain, palpitations, orthopnea and leg swelling.  Gastrointestinal:  Negative for abdominal pain, blood in stool, constipation, diarrhea, heartburn, melena, nausea and vomiting.  Genitourinary:  Negative for dysuria, frequency and urgency.  Musculoskeletal:  Positive for joint pain and myalgias. Negative for back pain.  Skin: Negative.  Negative for itching and rash.  Neurological:  Negative for dizziness, tingling, focal weakness, weakness and headaches.  Endo/Heme/Allergies:  Does not bruise/bleed easily.  Psychiatric/Behavioral:  Negative for depression. The patient is not nervous/anxious and does not have insomnia.      PAST MEDICAL HISTORY :  Past Medical History:  Diagnosis Date   Atherosclerosis of coronary artery 09/28/2016   Chest CT Nov 2017   Atrial tachycardia (HCC)    CLL (chronic lymphocytic leukemia) (HCC)    Elevated C-reactive protein (CRP)    Elevated PSA    Fractured sternum 1999   Herpes    History of rib fracture 1999   multiple   Hyperlipidemia    Psoriasis    Pulmonary embolism (HCC)    Rheumatoid factor positive 02/2014   Rosacea    Thoracic aortic atherosclerosis (HCC) 09/28/2016   Chest CT Nov 2017   Tobacco abuse    Vitamin D deficiency disease     PAST SURGICAL HISTORY :   Past Surgical History:  Procedure Laterality Date   APPENDECTOMY  11/27/1972   IR REMOVAL TUN ACCESS W/ PORT W/O FL MOD SED  03/20/2022   PERICARDIOCENTESIS N/A 10/03/2021   Procedure: PERICARDIOCENTESIS;  Surgeon: Corky Crafts, MD;  Location: [facility] INVASIVE CV LAB;  Service: Cardiovascular;  Laterality: N/A;   PORTA CATH INSERTION N/A 06/06/2021   Procedure: PORTA CATH INSERTION;  Surgeon: Annice Needy, MD;  Location: [facility] INVASIVE CV LAB;  Service: Cardiovascular;  Laterality: N/A;   SHOULDER  ARTHROSCOPY  10/23/2016   Procedure: ARTHROSCOPY SHOULDER WITH DISTAL CLAVICLE EXCISION, PARTIAL ACROMIONECTOMY, AND DEBRIDEMENT;  Surgeon: Deeann Saint, MD;  Location: [facility] ORS;  Service: Orthopedics;;   TIBIA FRACTURE SURGERY Left 11/27/2002   titanium rod, ORIF   TRIGGER FINGER RELEASE Right 04/27/2023   rt ring finger/[facility]    FAMILY HISTORY :   Family History  Problem Relation Age of Onset   Heart disease Father    Osteoarthritis Father    Alzheimer's disease Father    Cancer Neg Hx    Diabetes Neg Hx    Hypertension Neg Hx    Stroke Neg Hx    COPD Neg Hx     SOCIAL HISTORY:   Social History   Tobacco Use   Smoking status: Every Day    Current packs/day: 0.50    Average packs/day: 0.5 packs/day for 45.0 years (22.5 ttl pk-yrs)    Types: Cigarettes   Smokeless tobacco: Never   Tobacco comments:    11/6- currently smoking 5 cigarettes a day  Vaping Use   Vaping status: Never Used  Substance Use Topics   Alcohol use: Yes    Alcohol/week: 3.0 standard drinks of alcohol    Types: 3 Glasses of wine per week    Comment: a couple glasses of wine a week   Drug use: No    ALLERGIES:  is allergic to gluten meal.  MEDICATIONS:  Current Outpatient Medications  Medication Sig Dispense Refill   acalabrutinib maleate (CALQUENCE) 100 MG tablet Take 1 tablet (100mg ) by mouth daily in the morning and 1 tablet (100mg ) in evening on Mondays, Wednesdays, and Fridays. 40 tablet 2   acalabrutinib maleate (CALQUENCE) 100 MG tablet Take 1 tablet (100mg ) by mouth daily in the morning and 1 tablet (100mg ) in evening on Mondays, Wednesdays, and Fridays. 40 tablet 2   cholecalciferol (VITAMIN D3) 25 MCG (1000 UNIT) tablet Take 1,000 Units by mouth daily.     Roflumilast (ZORYVE) 0.3 % CREA Apply 1 Application topically at bedtime. Qhs to psoriasis on legs and arms 60 g 6   No current facility-administered medications for this visit.    PHYSICAL EXAMINATION: ECOG PERFORMANCE STATUS:  0 - Asymptomatic  BP 115/76 (BP Location: Left Arm, Patient Position: Sitting)   Pulse 93   Temp 97.8 F (36.6 C) (Tympanic)   Resp 18   Wt 167 lb 12.8 oz (76.1 kg)   SpO2 97%   BMI 27.08 kg/m   Filed Weights   11/07/23 1039  Weight: 167 lb 12.8 oz (76.1 kg)   No significant lymphadenopathy lymphadenopathy in the neck/underarm.  Physical Exam HENT:     Head: Normocephalic and atraumatic.     Mouth/Throat:     Pharynx: No oropharyngeal exudate.  Eyes:     Pupils: Pupils are equal, round, and reactive to light.  Cardiovascular:     Rate and Rhythm: Normal rate and regular rhythm.  Pulmonary:     Effort: No respiratory distress.     Breath sounds: No wheezing.  Abdominal:     General: Bowel sounds are normal. There is no distension.     Palpations: Abdomen is soft. There is no mass.     Tenderness: There is no abdominal tenderness. There is no guarding or rebound.  Musculoskeletal:        General: No tenderness. Normal range of motion.     Cervical back: Normal range of motion and neck supple.  Skin:    General: Skin is warm.  Neurological:     Mental Status: He is alert and oriented to person, place, and time.  Psychiatric:        Mood and Affect: Affect normal.     LABORATORY DATA:  I have reviewed the data as listed    Component Value Date/Time   NA 138 11/07/2023 1027   NA 139 10/27/2021 0857   K 4.1 11/07/2023 1027   CL 107 11/07/2023 1027   CO2 25 11/07/2023 1027   GLUCOSE 128 (H) 11/07/2023 1027   BUN 10 11/07/2023 1027   BUN 16 10/27/2021 0857   CREATININE 0.83 11/07/2023 1027   CREATININE 0.84 10/06/2019 0000   CALCIUM 9.3 11/07/2023 1027   PROT 6.4 (L) 11/07/2023 1027   ALBUMIN 3.7 11/07/2023 1027   AST 19 11/07/2023 1027   ALT 26 11/07/2023 1027   ALKPHOS 69 11/07/2023 1027   BILITOT 0.6 11/07/2023 1027   GFRNONAA >60 11/07/2023 1027   GFRNONAA 91 10/06/2019 0000   GFRAA >60 12/26/2019 0842   GFRAA 106 10/06/2019  0000    No results  found for: "SPEP", "UPEP"  Lab Results  Component Value Date   WBC 12.1 (H) 11/07/2023   NEUTROABS 9.1 (H) 11/07/2023   HGB 16.2 11/07/2023   HCT 46.6 11/07/2023   MCV 90.7 11/07/2023   PLT 251 11/07/2023      Chemistry      Component Value Date/Time   NA 138 11/07/2023 1027   NA 139 10/27/2021 0857   K 4.1 11/07/2023 1027   CL 107 11/07/2023 1027   CO2 25 11/07/2023 1027   BUN 10 11/07/2023 1027   BUN 16 10/27/2021 0857   CREATININE 0.83 11/07/2023 1027   CREATININE 0.84 10/06/2019 0000      Component Value Date/Time   CALCIUM 9.3 11/07/2023 1027   ALKPHOS 69 11/07/2023 1027   AST 19 11/07/2023 1027   ALT 26 11/07/2023 1027   BILITOT 0.6 11/07/2023 1027       RADIOGRAPHIC STUDIES: I have personally reviewed the radiological images as listed and agreed with the findings in the report. No results found.   ASSESSMENT & PLAN:   .CLL (chronic lymphocytic leukemia) (HCC) # SLL/CLL-patient currently on acalabrutinib 100 mg twice a day [since start of May 2023]- tolerating well except mild-moderate-severe fatigue/insominia [see below]. Continue acalbrutinib, but SKIP the PM dose T/Thr/Sat & Sun.  # Clinically, no evidence of any progression.  Labs within normal limits. JUNE 8th, 2024-  CT CAP- No pathologically enlarged lymph nodes within the chest, abdomen or pelvis and no splenomegaly- stable; HOLD off imaging until ? June 2025.   # Myalgias/Fatigue/ Insomnia: ? Related Acalabrutinib- Continue acalbrutinib, but SKIP the PM dose 4 days. s/p Exercise/CARE program-  stable.    # Diarrhea: G-1- ? Related to Acalabrutinib- monitor for now.- stable.    # Psoriasis/? Arthritis- relative contraindication with TNF-Alpha, but not absolute- on topical zorvye- stable.   # Active smoker: recommend quitting smoking; cutting down-   # Enlarged prostate- PSA 7.0 [MAY 2024-]- s/p urology evaluation [Dr. Sninski/Dr. Wolf's office]- awaiting trending PSA- and then considering prostate  MRI or biopsy if remains concerning. Recommend continued follow up Urology.  # Secondary malignancies: dermatology [Dr.Stewart]- continue surveillance  # IV access: PIV  # Vaccines: ok with shingrix # [first in April, 2023];  recommend -flu/RSV/ and Covid  # DISPOSITION: # follow up in 3  months MD; labs- cbc/cmp; URIC ACID; LDH;mag; quantitative immunoglobulins  Dr.B   Orders Placed This Encounter  Procedures   CBC with Differential (Cancer Center Only)    Standing Status:   Future    Standing Expiration Date:   11/06/2024   CMP (Cancer Center only)    Standing Status:   Future    Standing Expiration Date:   11/06/2024   Uric acid    Standing Status:   Future    Standing Expiration Date:   11/06/2024   Lactate dehydrogenase    Standing Status:   Future    Standing Expiration Date:   11/06/2024   Magnesium    Standing Status:   Future    Standing Expiration Date:   11/06/2024   Immunoglobulins, QN, A/E/G/M    Standing Status:   Future    Standing Expiration Date:   11/06/2024       Earna Coder, MD 11/07/2023 12:35 PM

## 2023-11-07 NOTE — Assessment & Plan Note (Addendum)
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 Continue acalbrutinib, but SKIP the PM dose T/Thr/Sat & Sun.  # Clinically, no evidence of any progression.  Labs within normal limits. JUNE 8th, 2024-  CT CAP- No pathologically enlarged lymph nodes within the chest, abdomen or pelvis and no splenomegaly- stable; HOLD off imaging until ? June 2025.   # Myalgias/Fatigue/ Insomnia: ? Related Acalabrutinib- Continue acalbrutinib, but SKIP the PM dose 4 days. s/p Exercise/CARE program-  stable.    # Diarrhea: G-1- ? Related to Acalabrutinib- monitor for now.- stable.    # Psoriasis/? Arthritis- relative contraindication with TNF-Alpha, but not absolute- on topical zorvye- stable.   # Active smoker: recommend quitting smoking; cutting down-   # Enlarged prostate- PSA 7.0 [MAY 2024-]- s/p urology evaluation [Dr. Sninski/Dr. Wolf's office]- awaiting trending PSA- and then considering prostate MRI or biopsy if remains concerning. Recommend continued follow up Urology.  # Secondary malignancies: dermatology [Dr.Stewart]- continue surveillance  # IV access: PIV  # Vaccines: ok with shingrix # [first in April, 2023];  recommend -flu/RSV/ and Covid  # DISPOSITION: # follow up in 3  months MD; labs- cbc/cmp; URIC ACID; LDH;mag; quantitative immunoglobulins  Dr.B

## 2023-11-07 NOTE — Progress Notes (Signed)
Pt feeling well. Sleep disturbance with the medication ,Calquence and at times can cause loose stills intermittently. Energy is not what it once was. Chronic cough, poss post nasal drip. Does he need covid vaccine ?

## 2023-11-13 ENCOUNTER — Other Ambulatory Visit (HOSPITAL_COMMUNITY): Payer: Self-pay

## 2023-11-13 ENCOUNTER — Other Ambulatory Visit: Payer: Self-pay

## 2023-11-13 ENCOUNTER — Other Ambulatory Visit: Payer: Self-pay | Admitting: Internal Medicine

## 2023-11-13 DIAGNOSIS — C9110 Chronic lymphocytic leukemia of B-cell type not having achieved remission: Secondary | ICD-10-CM

## 2023-11-13 MED ORDER — CALQUENCE 100 MG PO TABS
ORAL_TABLET | ORAL | 2 refills | Status: DC
  Filled 2023-11-13: qty 40, 28d supply, fill #0
  Filled 2023-12-11: qty 40, 28d supply, fill #1
  Filled 2024-01-01: qty 40, 28d supply, fill #2

## 2023-11-13 NOTE — Telephone Encounter (Signed)
CBC with Differential ([facility] Only) Order: [id number]  Status: Final result     Next appt: 02/06/2024 at 01:15 PM in Oncology ([facility])     Dx: CLL (chronic lymphocytic leukemia) (HCC)   Test Result Released: Yes (seen)   0 Result Notes          Component Ref Range & Units (hover) 6 d ago (11/07/23) 3 mo ago (08/08/23) 6 mo ago (05/08/23) 9 mo ago (01/31/23) 1 yr ago (11/01/22) 1 yr ago (09/01/22) 1 yr ago (07/18/22)  WBC Count 12.1 High  9.5 12.0 High  11.3 High  7.7 7.9 9.5  RBC 5.14 5.08 5.07 5.27 5.35 5.11 4.90  Hemoglobin 16.2 16.0 15.9 16.4 16.5 16.1 15.2  HCT 46.6 46.3 45.6 47.8 47.9 46.5 44.2  MCV 90.7 91.1 89.9 90.7 89.5 91.0 90.2  MCH 31.5 31.5 31.4 31.1 30.8 31.5 31.0  MCHC 34.8 34.6 34.9 34.3 34.4 34.6 34.4  RDW 13.7 13.3 13.3 13.5 13.3 13.3 14.4  Platelet Count 251 242 242 277 242 260 252  nRBC 0.0 0.0 0.0 0.0 0.0 0.0 0.0  Neutrophils Relative % 75 72 75 76 65 72 68  Neutro Abs 9.1 High  6.8 8.9 High  8.7 High  5.1 5.6 6.5  Lymphocytes Relative 15 18 16 14 20 17 18   Lymphs Abs 1.8 1.7 1.9 1.5 1.5 1.3 1.7  Monocytes Relative 7 7 6 7 10 8 11   Monocytes Absolute 0.8 0.7 0.8 0.8 0.8 0.6 1.1 High   Eosinophils Relative 1 2 2 2 3 2 1   Eosinophils Absolute 0.2 0.2 0.3 0.2 0.2 0.2 0.1  Basophils Relative 1 1 1 1 1 1 1   Basophils Absolute 0.1 0.1 0.1 0.1 0.1 0.1 0.1  Immature Granulocytes 1 0 0 0 1 0 1  Abs Immature Granulocytes 0.08 High  0.04 CM 0.05 CM 0.05 CM 0.04 CM 0.03 CM 0.06 CM  Comment: Performed at [facility], [address] [place], [place] [address]  Resulting Agency [facility]        Specimen Collected: 11/07/23 10:27 Last Resulted: 11/07/23 10:39      Lab Flowsheet      Order Details      View Encounter      Lab and Collection Details      Routing      Result History    View All Conversations on this Encounter    CM=Additional comments    Result Care  Coordination   Patient Communication   Add Comments   Seen Back to Top   Other Results from 11/07/2023  Magnesium Order: [id number]  Status: Final result     Next appt: 02/06/2024 at 01:15 PM in Oncology ([facility])     Dx: CLL (chronic lymphocytic leukemia) (HCC)     Test Result Released: Yes (seen)   0 Result Notes          Component Ref Range & Units (hover) 6 d ago (11/07/23) 3 mo ago (08/08/23) 6 mo ago (05/08/23) 9 mo ago (01/31/23) 1 yr ago (12/27/21) 1 yr ago (12/09/21) 1 yr ago (12/02/21)  Magnesium 1.9 2.1 CM 2.0 CM 2.2 CM 1.8 CM 2.0 CM 1.9 CM  Comment: Performed at [facility], [address] [place], [place] [address]  Resulting Agency [facility]  [facility]        Specimen Collected: 11/07/23 10:27 Last Resulted: 11/07/23 10:48      Lab Flowsheet      Order Details      View Encounter      Lab and Collection Details      Routing      Result History    View All Conversations on this Encounter    CM=Additional comments    Result Care Coordination   Patient Communication   Add Comments   Seen Back to Top     Lactate dehydrogenase Order: [id number]  Status: Final result     Next appt: 02/06/2024 at 01:15 PM in Oncology ([facility])     Dx: CLL (chronic lymphocytic leukemia) (HCC)     Test Result Released: Yes (seen)   0 Result Notes          Component Ref Range & Units (hover) 6 d ago (11/07/23) 3 mo ago (08/08/23) 6 mo ago (05/08/23) 9 mo ago (01/31/23) 1 yr ago (11/01/22) 1 yr ago (09/01/22) 1 yr ago (07/18/22)  LDH 108 104 CM 109 CM 130 CM 117 CM 122 CM 137 CM  Comment: Performed at [facility], [address] [place], [place] [address]  Resulting Agency [facility]        Specimen Collected: 11/07/23 10:27 Last Resulted: 11/07/23 10:48      Lab Flowsheet      Order Details       View Encounter      Lab and Collection Details      Routing      Result History    View All Conversations on this Encounter    CM=Additional comments    Result Care Coordination   Patient Communication   Add Comments   Seen Back to Top     Uric acid Order: [id number]  Status: Final result     Next appt: 02/06/2024 at 01:15 PM in Oncology ([facility])     Dx: CLL (chronic lymphocytic leukemia) (HCC)     Test Result Released: Yes (seen)   0 Result Notes          Component Ref Range & Units (hover) 6 d ago (11/07/23) 3 mo ago (08/08/23) 6 mo ago (05/08/23) 1 yr ago (11/01/22) 1 yr ago (09/01/22) 2 yr ago (10/10/21) 2 yr ago (09/29/21)  Uric Acid, Serum 5.8 6.0 CM 5.5 CM 5.7 CM 5.5 CM 2.1 Low  CM 2.7 Low  CM  Comment: Performed at [facility], [address] [place], [place] [address]  Resulting Agency [facility]        Specimen Collected: 11/07/23 10:27 Last Resulted: 11/07/23 11:32      Lab Flowsheet      Order Details      View Encounter      Lab and Collection Details      Routing      Result History    View All Conversations on this Encounter    CM=Additional comments    Result Care Coordination   Patient Communication   Add Comments   Seen Back to Top      Contains abnormal data CMP (Cancer Center only) Order: [id number]  Status: Final result  Next appt: 02/06/2024 at 01:15 PM in Oncology ([facility])     Dx: CLL (chronic lymphocytic leukemia) (HCC)     Test Result Released: Yes (seen)   0 Result Notes          Component Ref Range & Units (hover) 6 d ago (11/07/23) 3 mo ago (08/08/23) 6 mo ago (05/08/23) 9 mo ago (01/31/23) 1 yr ago (11/01/22) 1 yr ago (09/01/22) 1 yr ago (07/18/22)  Sodium 138 135 136 CM 138 140 137 139  Potassium 4.1 3.7 4.1 CM 4.0 4.2 4.1 4.1  Chloride 107 106 104 CM 104 106 109 107  CO2 25 23 23  CM 26 26 25 26   Glucose, Bld 128  High  105 High  CM 61 Low  CM 62 Low  CM 113 High  CM 125 High  CM 75 CM  Comment: Glucose reference range applies only to samples taken after fasting for at least 8 hours.  BUN 10 9 9  CM 10 10 10 12   Creatinine 0.83 0.88 0.93 CM 0.90 0.81 0.97 0.79  Calcium 9.3 9.1 9.3 CM 9.5 9.3 9.1 9.5  Total Protein 6.4 Low  6.3 Low  6.4 Low  CM 7.0 6.5 6.6 6.8  Albumin 3.7 3.8 4.0 CM 4.2 3.9 3.9 4.0  AST 19 19 18  CM 20 19 17 18   ALT 26 22 22  CM 25 24 16 17   Alkaline Phosphatase 69 68 70 CM 79 76 73 65  Total Bilirubin 0.6 0.6 R 0.5 R, CM 0.5 R 0.6 R 0.8 R 0.6 R  GFR, Estimated >60 >60 CM >60 CM      Comment: (NOTE) Calculated using the CKD-EPI Creatinine Equation (2021)  Anion gap 6 6 CM 9 CM 8 CM 8 CM 3 Low  CM 6 CM  Comment: Performed at [facility], [address] [place], [place] [address]  Resulting Agency [facility]        Specimen Collected: 11/07/23 10:27 Last Resulted: 11/07/23 10:51

## 2023-11-13 NOTE — Progress Notes (Signed)
Specialty Pharmacy Refill Coordination Note  Andres Mullins is a 72 y.o. male contacted today regarding refills of specialty medication(s) Acalabrutinib Maleate (Calquence)   Patient requested Delivery   Delivery date: 11/16/23   Verified address: [address]  [place] [id number]   Medication will be filled on 12.19.24.  Refill request pending

## 2023-12-11 ENCOUNTER — Other Ambulatory Visit: Payer: Self-pay

## 2023-12-11 NOTE — Progress Notes (Signed)
 Specialty Pharmacy Refill Coordination Note  Andres Mullins is a 73 y.o. male contacted today regarding refills of specialty medication(s) Acalabrutinib  Maleate (Calquence )   Patient requested Delivery   Delivery date: 12/13/23   Verified address: [address]   [place] [address]   Medication will be filled on 12/12/23.

## 2023-12-18 ENCOUNTER — Other Ambulatory Visit (HOSPITAL_COMMUNITY): Payer: Self-pay

## 2024-01-01 ENCOUNTER — Other Ambulatory Visit: Payer: Self-pay

## 2024-01-01 NOTE — Progress Notes (Signed)
 Specialty Pharmacy Refill Coordination Note  Andres Mullins is a 73 y.o. male contacted today regarding refills of specialty medication(s) Acalabrutinib  Maleate (Calquence )   Patient requested Delivery   Delivery date: 01/08/24   Verified address: [address]  [place] [id number]   Medication will be filled on 01/07/24.

## 2024-01-04 ENCOUNTER — Other Ambulatory Visit: Payer: Self-pay

## 2024-01-07 ENCOUNTER — Other Ambulatory Visit: Payer: Self-pay

## 2024-02-06 ENCOUNTER — Inpatient Hospital Stay (HOSPITAL_BASED_OUTPATIENT_CLINIC_OR_DEPARTMENT_OTHER): Payer: Medicare PPO | Admitting: Internal Medicine

## 2024-02-06 ENCOUNTER — Inpatient Hospital Stay: Payer: Medicare PPO | Attending: Internal Medicine

## 2024-02-06 ENCOUNTER — Other Ambulatory Visit (HOSPITAL_COMMUNITY): Payer: Self-pay

## 2024-02-06 ENCOUNTER — Other Ambulatory Visit: Payer: Self-pay | Admitting: Internal Medicine

## 2024-02-06 ENCOUNTER — Other Ambulatory Visit: Payer: Self-pay

## 2024-02-06 DIAGNOSIS — M2550 Pain in unspecified joint: Secondary | ICD-10-CM | POA: Insufficient documentation

## 2024-02-06 DIAGNOSIS — C9110 Chronic lymphocytic leukemia of B-cell type not having achieved remission: Secondary | ICD-10-CM

## 2024-02-06 DIAGNOSIS — E785 Hyperlipidemia, unspecified: Secondary | ICD-10-CM | POA: Diagnosis not present

## 2024-02-06 DIAGNOSIS — N400 Enlarged prostate without lower urinary tract symptoms: Secondary | ICD-10-CM | POA: Diagnosis not present

## 2024-02-06 DIAGNOSIS — Z79899 Other long term (current) drug therapy: Secondary | ICD-10-CM | POA: Insufficient documentation

## 2024-02-06 DIAGNOSIS — F17210 Nicotine dependence, cigarettes, uncomplicated: Secondary | ICD-10-CM | POA: Diagnosis not present

## 2024-02-06 DIAGNOSIS — Z86711 Personal history of pulmonary embolism: Secondary | ICD-10-CM | POA: Diagnosis not present

## 2024-02-06 DIAGNOSIS — R5383 Other fatigue: Secondary | ICD-10-CM | POA: Diagnosis not present

## 2024-02-06 DIAGNOSIS — Z8261 Family history of arthritis: Secondary | ICD-10-CM | POA: Diagnosis not present

## 2024-02-06 DIAGNOSIS — R197 Diarrhea, unspecified: Secondary | ICD-10-CM | POA: Diagnosis not present

## 2024-02-06 DIAGNOSIS — Z9049 Acquired absence of other specified parts of digestive tract: Secondary | ICD-10-CM | POA: Diagnosis not present

## 2024-02-06 DIAGNOSIS — M791 Myalgia, unspecified site: Secondary | ICD-10-CM | POA: Diagnosis not present

## 2024-02-06 DIAGNOSIS — Z818 Family history of other mental and behavioral disorders: Secondary | ICD-10-CM | POA: Diagnosis not present

## 2024-02-06 DIAGNOSIS — Z8249 Family history of ischemic heart disease and other diseases of the circulatory system: Secondary | ICD-10-CM | POA: Diagnosis not present

## 2024-02-06 DIAGNOSIS — J449 Chronic obstructive pulmonary disease, unspecified: Secondary | ICD-10-CM | POA: Insufficient documentation

## 2024-02-06 DIAGNOSIS — L409 Psoriasis, unspecified: Secondary | ICD-10-CM | POA: Diagnosis not present

## 2024-02-06 LAB — CMP (CANCER CENTER ONLY)
ALT: 21 U/L (ref 0–44)
AST: 18 U/L (ref 15–41)
Albumin: 4.1 g/dL (ref 3.5–5.0)
Alkaline Phosphatase: 71 U/L (ref 38–126)
Anion gap: 8 (ref 5–15)
BUN: 12 mg/dL (ref 8–23)
CO2: 24 mmol/L (ref 22–32)
Calcium: 9.8 mg/dL (ref 8.9–10.3)
Chloride: 105 mmol/L (ref 98–111)
Creatinine: 0.88 mg/dL (ref 0.61–1.24)
GFR, Estimated: >60 mL/min (ref >60)
Glucose, Bld: 95 mg/dL (ref 70–99)
Potassium: 4.1 mmol/L (ref 3.5–5.1)
Sodium: 137 mmol/L (ref 135–145)
Total Bilirubin: 0.6 mg/dL (ref 0.0–1.2)
Total Protein: 6.7 g/dL (ref 6.5–8.1)

## 2024-02-06 LAB — LACTATE DEHYDROGENASE: LDH: 110 U/L (ref 98–192)

## 2024-02-06 LAB — CBC WITH DIFFERENTIAL (CANCER CENTER ONLY)
Abs Immature Granulocytes: 0.07 10*3/uL (ref 0.00–0.07)
Basophils Absolute: 0.1 10*3/uL (ref 0.0–0.1)
Basophils Relative: 1 %
Eosinophils Absolute: 0.2 10*3/uL (ref 0.0–0.5)
Eosinophils Relative: 2 %
HCT: 46.4 % (ref 39.0–52.0)
Hemoglobin: 16.2 g/dL (ref 13.0–17.0)
Immature Granulocytes: 1 %
Lymphocytes Relative: 17 %
Lymphs Abs: 1.7 10*3/uL (ref 0.7–4.0)
MCH: 31.6 pg (ref 26.0–34.0)
MCHC: 34.9 g/dL (ref 30.0–36.0)
MCV: 90.4 fL (ref 80.0–100.0)
Monocytes Absolute: 0.9 10*3/uL (ref 0.1–1.0)
Monocytes Relative: 9 %
Neutro Abs: 7.4 10*3/uL (ref 1.7–7.7)
Neutrophils Relative %: 70 %
Platelet Count: 254 10*3/uL (ref 150–400)
RBC: 5.13 MIL/uL (ref 4.22–5.81)
RDW: 13.6 % (ref 11.5–15.5)
WBC Count: 10.3 10*3/uL (ref 4.0–10.5)
nRBC: 0 % (ref 0.0–0.2)

## 2024-02-06 LAB — URIC ACID: Uric Acid, Serum: 5.7 mg/dL (ref 3.7–8.6)

## 2024-02-06 LAB — MAGNESIUM: Magnesium: 1.9 mg/dL (ref 1.7–2.4)

## 2024-02-06 NOTE — Progress Notes (Signed)
 Specialty Pharmacy Refill Coordination Note  Andres Mullins is a 73 y.o. male contacted today regarding refills of specialty medication(s) No data recorded  Patient requested (Patient-Rptd) Delivery   Delivery date: (Patient-Rptd) 02/11/24   Verified address: (Patient-Rptd) [address]     [place], [place] [address]   Medication will be filled on 02/08/24.   This fill date is pending response to refill request from provider. Patient is aware and if they have not received fill by intended date, they must follow up with pharmacy.

## 2024-02-06 NOTE — Progress Notes (Signed)
 Patient is doing great he is just wanting to see how his lab results are for today. He has no new questions for the doctor today.

## 2024-02-06 NOTE — Assessment & Plan Note (Addendum)
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 Continue acalbrutinib, but SKIP the PM dose T/Thr/Sat & Sun.  # Clinically, no evidence of any progression.  Labs within normal limits. JUNE 8th, 2024-  CT CAP- No pathologically enlarged lymph nodes within the chest, abdomen or pelvis and no splenomegaly-  stable- will get CT CAP June 2025- will order today.   # Myalgias/Fatigue/ Insomnia: ? Related Acalabrutinib- Continue acalbrutinib, but SKIP the PM dose 4 days. s/p Exercise/CARE program-  stable.    # Diarrhea: G-1- ? Related to Acalabrutinib- monitor for now.- stable.    # Psoriasis/? Arthritis-   on topical zorvye- stable.   # Active smoker: recommend quitting smoking; cutting down- would like smoking given because rolling her eyes history of smoking user: Yes 5 or 6 today  # Enlarged prostate- PSA 7.0 [MAY 2024-]- s/p urology evaluation [Dr. Sninski/Dr. Wolf's office]- NOV 2024-PSA-5.3- improving- currently on surveillance- considering prostate MRI or biopsy if remains concerning. Recommend continued follow up Urology.  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

## 2024-02-06 NOTE — Progress Notes (Signed)
 [facility] OFFICE PROGRESS NOTE  Patient Care Team: Margarita Mail, DO as PCP - General (Internal Medicine) End, Cristal Deer, MD as PCP - Cardiology (Cardiology) Lada, Janit Bern, MD as Attending Physician (Family Medicine) Willeen Niece, MD (Dermatology) Sondra Come, MD as Consulting Physician (Urology) Earna Coder, MD as Consulting Physician (Hematology and Oncology) Earna Coder, MD as Consulting Physician (Oncology)   SUMMARY OF ONCOLOGIC HISTORY:  Oncology History Overview Note  # 2016- CLL-[flow] CD5 (+)/CD23 (+) clonal B-cell population, CLL/SLL phenotype, 18% of leukocytes, <5,000/uL, CD38 positive   # NOV 2020 PET [incidental]- Generalized Lymphadenopathy- left supraclav/ bil ax LN L> R; mesenteric LN; pelvic LN; FEB 5th 2021- 2.5cm left supraclav; Left ax LN- 2.4cm.   AUG 2021-Head and neck: The brain demonstrates symmetric appearing diffuse physiologic uptake without focal finding to suggest abnormality. In the left posterior and medial maxillary cavity, there is an area of mucoid deposition seen within SUV max of 28.8. This was seen in the previous study of November. The specific bony erosion, clear-cut soft tissue lesion is not detected. This probably represents a chronic sinus disease focus. The bilateral optic nerves and extraocular muscles to the largest nodes are seen on axial image 98. 1 now demonstrates a short axis measurement of 3.3 cm and the other more medially, a short axis measurement of 2.5 cm. Demonstrate symmetric increased uptake. The left, much greater than right, jugulodigastric and supraclavicular nodes have increased significantly in size and avidity since the previous study. Both of these lymph nodes demonstrate an SUV max of 3.9. No largest lymph node in the right subclavicular space is seen on axial image 104, demonstrating an SUV max of 3.3, and a short axis measurement of 1.4 cm.   On axial image 102, a right  paratracheal lymph node is again identified with increased avidity, located immediately posterior to the right thyroid pole. This area was noted previously, but now increased avidity is seen, with an SUV max of 8.6 in this area. Multiple smaller, and less avid lymph nodes scattered throughout the left greater than right neck are seen, as well as supraclavicular and infraclavicular areas.   Thorax: Significant axillary lymph nodes have increased significantly in size. These are also left greater than right, with the largest on the left seen on image #114 with a short axis measurement of 2.2 cm and an SUV max of 4.3. The largest on the right as seen on image 111, demonstrating a short axis of 1.4 cm and an SUV max of 4.0. Too numerous to count smaller and similarly avid lymph nodes in both axillae are also noted. These are all larger than previously seen and in general, more avid.  Numerous small AP window and lesser mediastinal lymph nodes are seen with minimal increased avidity, appearing similar in size and avidity to the previous study. No other significant mediastinal adenopathy. No hilar adenopathy detected. These likely also represent lymphoproliferative disease.  Hyperinflation of COPD, bibasilar dependent atelectasis, some paucity of lung tissue and biapical scarring are noted without other focal pulmonary mass or nodule, or acute pulmonary parenchymal disease. Physiologic uptake in the left heart is seen, as expected. Small hiatal hernia is again identified.   Abdomen pelvis: Physiologic uptake in the liver, gut and urinary tract is again noted. No focal area of asymmetric uptake in the structures is seen. The spleen also demonstrates generalized increased uptake and stable splenomegaly, up to 14.2 cm. On image 198, a preaortic lymph node is detected of  increased size since the previous study, now measuring up to 1.2 cm short axis.  Several large lymph nodes on image 203 of the mesentery are noted.  The largest of these, on image 201 and ureters up to 1.8 cm short axis. Despite the fact that measurable avidity is not detected in these are the surrounding lymph nodes, these are doubtless involved in the lymphoproliferative disease described, and appear larger. Multiple smaller lymph nodes scattered throughout the mesentery are also seen.  Grossly enlarged right greater than left common iliac chain lymph nodes are again seen, throughout the pelvis and into the inguinal areas bilaterally. The most avid on the right is seen on image #260 to, demonstrates a short axis of 2.5 cm, and an SUV max of 4.9. The largest on the left is identified image #272, demonstrates a short axis of 2.0 cm, and an SUV max of 4.7. These are similar to the previously noted values.  Right greater than left inguinal adenopathy is seen, with the largest lymph node seen on the right image 276, demonstrating a short axis of 1.5 cm and an SUV max of 3.9. This is larger in size and avidity than previously seen. There are bilateral enlarged lymph nodes associated with the external iliac vessels on image 277. These measure 2.0 cm on the right in short axis, with an SUV max of 3.9.  On the left, image 278, the external iliac lymph node is larger at 1.8 cm short axis, but with an SUV max of only 3.8. Lesser avid and smaller lymph nodes on both sides are also detected.   Musculoskeletal: Scattered, insignificant uptake is detected in the muscles especially in the bilateral joints, without focal finding to suggest lymphoproliferative disease.   IMPRESSIONS:  Significant increase in numbers, short axis size, and to a large extent, avidity -in the lymph nodes of the left greater than right neck, supraclavicular and infraclavicular areas, bilateral axillary lymph nodes and common iliac chain, external iliac chain and inguinal lymph nodes, consistent with worsening lymphoproliferative disease. The mediastinal lymph nodes seen are still present and  unchanged in size or [significantly] in avidity.    [place]- Bx- SLL/CLL.July 2022-Discussed with Dr. Asquith-oncologist, [place]-reviewed the pathology of the lymph node biopsy in 2021-positive for SLL; negative for cyclin D1/1114 translocation.    # July 20th, 2022- GAZYVA #1 Stann [place] plan ading VENATOCLAX after 2 cycles/de-bulking] s/p GAYZYVA [x6]- Feb 2023 - cycle #1  Venatoclax- x1 month- HELD sec to repeated hospitalization  # DISCONTINUED Erlene Senters [sec to intolearnce]  # PE acute [NOV 2022] provoked--currently s/p  Eliquis x 6 months [end of  April 2023].    # MAY 1st, 2023- START ACALABRUTINIB 100 mg BID  # December 2022-pericarditis/effusion needing pericardiocentesis; FEB 2023-acute respiratory failure-hospitalization-? Etiology [s/p ID; Pul; Dr.A; cards- [facility]]  # Lung cancer screening- on [facility]  # SURVIVORSHIP:   # GENETICS:   DIAGNOSIS:   STAGE:         ;  GOALS:  CURRENT/MOST RECENT THERAPY :       CLL (chronic lymphocytic leukemia) (HCC)  10/29/2019 Initial Diagnosis   CLL (chronic lymphocytic leukemia) (HCC)   06/15/2021 -  Chemotherapy   Patient is on Treatment Plan : LYMPHOMA CLL/SLL Venetoclax + Obinutuzumab q28d      INTERVAL HISTORY: Ambulating independently.  Alone.   A pleasant 73 year-old male patient with CLL/SLL currently most currently on acalabrutinib daily [except weekend PMs]  is here for follow-up.  Patient is doing great he is just  wanting to see how his lab results are for today. He has no new questions for the doctor today.  Denies any wheezing.  Denies any worsening cough.  No recent hospitalization. Otherwise denies any lumps or bumps.  No fevers.  No chills.    Review of Systems  Constitutional:  Positive for malaise/fatigue. Negative for chills, diaphoresis, fever and weight loss.  HENT:  Negative for nosebleeds and sore throat.   Eyes:  Negative for double vision.  Respiratory:  Negative for cough, hemoptysis, sputum production, shortness  of breath and wheezing.   Cardiovascular:  Negative for chest pain, palpitations, orthopnea and leg swelling.  Gastrointestinal:  Negative for abdominal pain, blood in stool, constipation, diarrhea, heartburn, melena, nausea and vomiting.  Genitourinary:  Negative for dysuria, frequency and urgency.  Musculoskeletal:  Positive for joint pain and myalgias. Negative for back pain.  Skin: Negative.  Negative for itching and rash.  Neurological:  Negative for dizziness, tingling, focal weakness, weakness and headaches.  Endo/Heme/Allergies:  Does not bruise/bleed easily.  Psychiatric/Behavioral:  Negative for depression. The patient is not nervous/anxious and does not have insomnia.      PAST MEDICAL HISTORY :  Past Medical History:  Diagnosis Date   Atherosclerosis of coronary artery 09/28/2016   Chest CT Nov 2017   Atrial tachycardia (HCC)    CLL (chronic lymphocytic leukemia) (HCC)    Elevated C-reactive protein (CRP)    Elevated PSA    Fractured sternum 1999   Herpes    History of rib fracture 1999   multiple   Hyperlipidemia    Psoriasis    Pulmonary embolism (HCC)    Rheumatoid factor positive 02/2014   Rosacea    Thoracic aortic atherosclerosis (HCC) 09/28/2016   Chest CT Nov 2017   Tobacco abuse    Vitamin D deficiency disease     PAST SURGICAL HISTORY :   Past Surgical History:  Procedure Laterality Date   APPENDECTOMY  11/27/1972   IR REMOVAL TUN ACCESS W/ PORT W/O FL MOD SED  03/20/2022   PERICARDIOCENTESIS N/A 10/03/2021   Procedure: PERICARDIOCENTESIS;  Surgeon: Corky Crafts, MD;  Location[facility] INVASIVE CV LAB;  Service: Cardiovascular;  Laterality: N/A;   PORTA CATH INSERTION N/A 06/06/2021   Procedure: PORTA CATH INSERTION;  Surgeon: Annice Needy, MD;  Location: [facility] INVASIVE CV LAB;  Service: Cardiovascular;  Laterality: N/A;   SHOULDER ARTHROSCOPY  10/23/2016   Procedure: ARTHROSCOPY SHOULDER WITH DISTAL CLAVICLE EXCISION, PARTIAL ACROMIONECTOMY, AND  DEBRIDEMENT;  Surgeon: Deeann Saint, MD;  Location: [facility] ORS;  Service: Orthopedics;;   TIBIA FRACTURE SURGERY Left 11/27/2002   titanium rod, ORIF   TRIGGER FINGER RELEASE Right 04/27/2023   rt ring finger/[facility]    FAMILY HISTORY :   Family History  Problem Relation Age of Onset   Heart disease Father    Osteoarthritis Father    Alzheimer's disease Father    Cancer Neg Hx    Diabetes Neg Hx    Hypertension Neg Hx    Stroke Neg Hx    COPD Neg Hx     SOCIAL HISTORY:   Social History   Tobacco Use   Smoking status: Every Day    Current packs/day: 0.50    Average packs/day: 0.5 packs/day for 45.0 years (22.5 ttl pk-yrs)    Types: Cigarettes   Smokeless tobacco: Never   Tobacco comments:    11/6- currently smoking 5 cigarettes a day   Vaping Use   Vaping status: Never Used  Substance Use Topics   Alcohol use: Yes    Alcohol/week: 3.0 standard drinks of alcohol    Types: 3 Glasses of wine per week    Comment: a couple glasses of wine a week   Drug use: No    ALLERGIES:  is allergic to gluten meal.  MEDICATIONS:  Current Outpatient Medications  Medication Sig Dispense Refill   acalabrutinib maleate (CALQUENCE) 100 MG tablet Take 1 tablet (100mg ) by mouth daily in the morning and 1 tablet (100mg ) in evening on Mondays, Wednesdays, and Fridays. 40 tablet 2   cholecalciferol (VITAMIN D3) 25 MCG (1000 UNIT) tablet Take 1,000 Units by mouth daily.     Roflumilast (ZORYVE) 0.3 % CREA Apply 1 Application topically at bedtime. Qhs to psoriasis on legs and arms 60 g 6   No current facility-administered medications for this visit.    PHYSICAL EXAMINATION: ECOG PERFORMANCE STATUS: 0 - Asymptomatic  BP 126/85 (BP Location: Right Arm, Patient Position: Sitting, Cuff Size: Normal)   Pulse 83   Temp (!) 96.7 F (35.9 C) (Tympanic)   Resp 20   Wt 166 lb 9.6 oz (75.6 kg)   SpO2 99%   BMI 26.89 kg/m   Filed Weights   02/06/24 1332  Weight: 166 lb 9.6 oz (75.6 kg)     No significant lymphadenopathy lymphadenopathy in the neck/underarm.  Physical Exam HENT:     Head: Normocephalic and atraumatic.     Mouth/Throat:     Pharynx: No oropharyngeal exudate.  Eyes:     Pupils: Pupils are equal, round, and reactive to light.  Cardiovascular:     Rate and Rhythm: Normal rate and regular rhythm.  Pulmonary:     Effort: No respiratory distress.     Breath sounds: No wheezing.  Abdominal:     General: Bowel sounds are normal. There is no distension.     Palpations: Abdomen is soft. There is no mass.     Tenderness: There is no abdominal tenderness. There is no guarding or rebound.  Musculoskeletal:        General: No tenderness. Normal range of motion.     Cervical back: Normal range of motion and neck supple.  Skin:    General: Skin is warm.  Neurological:     Mental Status: He is alert and oriented to person, place, and time.  Psychiatric:        Mood and Affect: Affect normal.     LABORATORY DATA:  I have reviewed the data as listed    Component Value Date/Time   NA 137 02/06/2024 1310   NA 139 10/27/2021 0857   K 4.1 02/06/2024 1310   CL 105 02/06/2024 1310   CO2 24 02/06/2024 1310   GLUCOSE 95 02/06/2024 1310   BUN 12 02/06/2024 1310   BUN 16 10/27/2021 0857   CREATININE 0.88 02/06/2024 1310   CREATININE 0.84 10/06/2019 0000   CALCIUM 9.8 02/06/2024 1310   PROT 6.7 02/06/2024 1310   ALBUMIN 4.1 02/06/2024 1310   AST 18 02/06/2024 1310   ALT 21 02/06/2024 1310   ALKPHOS 71 02/06/2024 1310   BILITOT 0.6 02/06/2024 1310   GFRNONAA >60 02/06/2024 1310   GFRNONAA 91 10/06/2019 0000   GFRAA >60 12/26/2019 0842   GFRAA 106 10/06/2019 0000    No results found for: "SPEP", "UPEP"  Lab Results  Component Value Date   WBC 10.3 02/06/2024   NEUTROABS 7.4 02/06/2024   HGB 16.2 02/06/2024   HCT 46.4 02/06/2024  MCV 90.4 02/06/2024   PLT 254 02/06/2024      Chemistry      Component Value Date/Time   NA 137 02/06/2024 1310    NA 139 10/27/2021 0857   K 4.1 02/06/2024 1310   CL 105 02/06/2024 1310   CO2 24 02/06/2024 1310   BUN 12 02/06/2024 1310   BUN 16 10/27/2021 0857   CREATININE 0.88 02/06/2024 1310   CREATININE 0.84 10/06/2019 0000      Component Value Date/Time   CALCIUM 9.8 02/06/2024 1310   ALKPHOS 71 02/06/2024 1310   AST 18 02/06/2024 1310   ALT 21 02/06/2024 1310   BILITOT 0.6 02/06/2024 1310       RADIOGRAPHIC STUDIES: I have personally reviewed the radiological images as listed and agreed with the findings in the report. No results found.   ASSESSMENT & PLAN:   .CLL (chronic lymphocytic leukemia) (HCC) # SLL/CLL-patient currently on acalabrutinib 100 mg twice a day [since start of May 2023]- tolerating well except mild-moderate-severe fatigue/insominia [see below]. Continue acalbrutinib, but SKIP the PM dose T/Thr/Sat & Sun.  # Clinically, no evidence of any progression.  Labs within normal limits. JUNE 8th, 2024-  CT CAP- No pathologically enlarged lymph nodes within the chest, abdomen or pelvis and no splenomegaly-  stable- will get CT CAP June 2025- will order today.   # Myalgias/Fatigue/ Insomnia: ? Related Acalabrutinib- Continue acalbrutinib, but SKIP the PM dose 4 days. s/p Exercise/CARE program-  stable.    # Diarrhea: G-1- ? Related to Acalabrutinib- monitor for now.- stable.    # Psoriasis/? Arthritis-   on topical zorvye- stable.   # Active smoker: recommend quitting smoking; cutting down- would like smoking given because rolling her eyes history of smoking user: Yes 5 or 6 today  # Enlarged prostate- PSA 7.0 [MAY 2024-]- s/p urology evaluation [Dr. Sninski/Dr. Wolf's office]- NOV 2024-PSA-5.3- improving- currently on surveillance- considering prostate MRI or biopsy if remains concerning. Recommend continued follow up Urology.  # Secondary malignancies: dermatology [Dr.Stewart]- continue surveillance  # IV access: PIV  # Vaccines: ok with shingrix # [first in April,  2023];  recommend -flu/RSV/ and Covid  # DISPOSITION: # follow up in 3 months/early june MD; labs- cbc/cmp; URIC ACID; LDH;mag; quantitative immunoglobulins; CT CAP prior- Dr.B   Orders Placed This Encounter  Procedures   CT CHEST ABDOMEN PELVIS W CONTRAST    Standing Status:   Future    Expected Date:   04/28/2024    Expiration Date:   02/05/2025    If indicated for the ordered procedure, I authorize the administration of contrast media per Radiology protocol:   Yes    Does the patient have a contrast media/X-ray dye allergy?:   No    Preferred imaging location?:   [facility]    If indicated for the ordered procedure, I authorize the administration of oral contrast media per Radiology protocol:   Yes       Earna Coder, MD 02/06/2024 2:04 PM

## 2024-02-07 ENCOUNTER — Other Ambulatory Visit: Payer: Self-pay

## 2024-02-07 ENCOUNTER — Encounter: Payer: Self-pay | Admitting: Internal Medicine

## 2024-02-07 ENCOUNTER — Other Ambulatory Visit (HOSPITAL_COMMUNITY): Payer: Self-pay

## 2024-02-07 MED ORDER — CALQUENCE 100 MG PO TABS
ORAL_TABLET | ORAL | 2 refills | Status: DC
  Filled 2024-02-07: qty 40, 28d supply, fill #0
  Filled 2024-03-05: qty 40, 28d supply, fill #1
  Filled 2024-03-28 (×2): qty 40, 28d supply, fill #2

## 2024-02-12 LAB — IMMUNOGLOBULINS A/E/G/M, SERUM
IgA: 82 mg/dL (ref 61–437)
IgE (Immunoglobulin E), Serum: 9 [IU]/mL (ref 6–495)
IgG (Immunoglobin G), Serum: 557 mg/dL — ABNORMAL LOW (ref 603–1613)
IgM (Immunoglobulin M), Srm: 14 mg/dL — ABNORMAL LOW (ref 20–172)

## 2024-02-26 ENCOUNTER — Other Ambulatory Visit: Payer: Self-pay

## 2024-02-26 DIAGNOSIS — R972 Elevated prostate specific antigen [PSA]: Secondary | ICD-10-CM

## 2024-02-27 ENCOUNTER — Ambulatory Visit: Payer: Self-pay | Admitting: Urology

## 2024-02-28 LAB — PHI SCORE REFLEX
% Free PSA: 17.3 %
PSA, Free: 1.33 ng/mL
Prostate Heath Index Score: 32.6
p2PSA: 15.6 pg/mL

## 2024-02-28 LAB — PROSTATE HEALTH INDEX: Prostate Specific Ag: 7.7 ng/mL — ABNORMAL HIGH (ref 0.0–3.9)

## 2024-03-04 ENCOUNTER — Ambulatory Visit (INDEPENDENT_AMBULATORY_CARE_PROVIDER_SITE_OTHER): Admitting: Urology

## 2024-03-04 ENCOUNTER — Other Ambulatory Visit: Payer: Self-pay

## 2024-03-04 ENCOUNTER — Encounter: Payer: Self-pay | Admitting: Urology

## 2024-03-04 VITALS — BP 105/68 | Ht 66.0 in | Wt 165.0 lb

## 2024-03-04 DIAGNOSIS — R972 Elevated prostate specific antigen [PSA]: Secondary | ICD-10-CM | POA: Diagnosis not present

## 2024-03-04 NOTE — Patient Instructions (Signed)
 Prostate Cancer Screening  Prostate cancer screening is testing that is done to check for the presence of prostate cancer in men. The prostate gland is a walnut-sized gland that is located below the bladder and in front of the rectum in males. The function of the prostate is to add fluid to semen during ejaculation. Prostate cancer is one of the most common types of cancer in men. Who should have prostate cancer screening? Screening recommendations vary based on age and other risk factors, as well as between the professional organizations who make the recommendations. In general, screening is recommended if: You are age 59 to 46 and have an average risk for prostate cancer. You should talk with your health care provider about your need for screening and how often screening should be done. Because most prostate cancers are slow growing and will not cause death, screening in this age group is generally reserved for men who have a 10- to 15-year life expectancy. You are younger than age 64, and you have these risk factors: Having a father, brother, or uncle who has been diagnosed with prostate cancer. The risk is higher if your family member's cancer occurred at an early age or if you have multiple family members with prostate cancer at an early age. Being a male who is [place] or is of [place] or sub-Saharan African descent. In general, screening is not recommended if: You are younger than age 71. You are between the ages of 93 and 30 and you have no risk factors. You are 74 years of age or older. At this age, the risks that screening can cause are greater than the benefits that it may provide. If you are at high risk for prostate cancer, your health care provider may recommend that you have screenings more often or that you start screening at a younger age. How is screening for prostate cancer done? The recommended prostate cancer screening test is a blood test called the prostate-specific antigen  (PSA) test. PSA is a protein that is made in the prostate. As you age, your prostate naturally produces more PSA. Abnormally high PSA levels may be caused by: Prostate cancer. An enlarged prostate that is not caused by cancer (benign prostatic hyperplasia, or BPH). This condition is very common in older men. A prostate gland infection (prostatitis) or urinary tract infection. Certain medicines such as male hormones (like testosterone) or other medicines that raise testosterone levels. A rectal exam may be done as part of prostate cancer screening to help provide information about the size of your prostate gland. When a rectal exam is performed, it should be done after the PSA level is drawn to avoid any effect on the results. Depending on the PSA results, you may need more tests, such as: A physical exam to check the size of your prostate gland, if not done as part of screening. Blood and imaging tests. A procedure to remove tissue samples from your prostate gland for testing (biopsy). This is the only way to know for certain if you have prostate cancer. What are the benefits of prostate cancer screening? Screening can help to identify cancer at an early stage, before symptoms start and when the cancer can be treated more easily. There is a small chance that screening may lower your risk of dying from prostate cancer. The chance is small because prostate cancer is a slow-growing cancer, and most men with prostate cancer die from a different cause. What are the risks of prostate cancer screening? The  main risk of prostate cancer screening is diagnosing and treating prostate cancer that would never have caused any symptoms or problems. This is called overdiagnosisand overtreatment. PSA screening cannot tell you if your PSA is high due to cancer or a different cause. A prostate biopsy is the only procedure to diagnose prostate cancer. Even the results of a biopsy may not tell you if your cancer needs to  be treated. Slow-growing prostate cancer may not need any treatment other than monitoring, so diagnosing and treating it may cause unnecessary stress or other side effects. Questions to ask your health care provider When should I start prostate cancer screening? What is my risk for prostate cancer? How often do I need screening? What type of screening tests do I need? How do I get my test results? What do my results mean? Do I need treatment? Where to find more information The American Cancer Society: www.cancer.org American Urological Association: www.auanet.org Contact a health care provider if: You have difficulty urinating. You have pain when you urinate or ejaculate. You have blood in your urine or semen. You have pain in your back or in the area of your prostate. Summary Prostate cancer is a common type of cancer in men. The prostate gland is located below the bladder and in front of the rectum. This gland adds fluid to semen during ejaculation. Prostate cancer screening may identify cancer at an early stage, when the cancer can be treated more easily and is less likely to have spread to other areas of the body. The prostate-specific antigen (PSA) test is the recommended screening test for prostate cancer, but it has associated risks. Discuss the risks and benefits of prostate cancer screening with your health care provider. If you are age 73 or older, the risks that screening can cause are greater than the benefits that it may provide. This information is not intended to replace advice given to you by your health care provider. Make sure you discuss any questions you have with your health care provider. Document Revised: 05/09/2021 Document Reviewed: 05/09/2021 Elsevier Patient Education  2024 [employer].

## 2024-03-04 NOTE — Progress Notes (Signed)
   03/04/2024 4:06 PM   Brayton Caves Sep 16, 1951 [id number]  Reason for visit: Follow up elevated PSA  HPI: 73 year old male with CLL who I originally saw in June 2024 for an elevated PSA of 7.  Family history of prostate cancer in his father treated at age 67 with surgery, though sounds like that may have been low risk disease and he did not ultimately die of prostate cancer.  The patient had a prostate biopsy about 10 to 15 years ago that showed inflammation, unclear what PSA was at that time.  Prostate measured 58 g on CT from June 2024.  Repeat PSA September 2024 decreased to 5.8, and he opted for a repeat PSA at follow-up.  PSA 02/26/2024 was 7.7(17% free), 5 score 32.6 indicating ~17% since his prostate cancer biopsy.  We reviewed the implications of an elevated PSA and the uncertainty surrounding it. In general, a man's PSA increases with age and is produced by both normal and cancerous prostate tissue. The differential diagnosis for elevated PSA includes BPH, prostate cancer, infection/prostatitis, recent intercourse/ejaculation, recent urethroscopic manipulation (foley placement/cystoscopy) or trauma. Management of an elevated PSA can include observation/surveillance, prostate MRI, or prostate biopsy and we discussed this in detail. Our goal is to detect clinically significant prostate cancers, and manage with either active surveillance, surgery, or radiation for localized disease. Risks of prostate biopsy include bleeding, infection (including life threatening sepsis), pain, and lower urinary symptoms. Hematuria, hematospermia, and blood in the stool are all common after biopsy and can persist up to 4 weeks.  Using shared decision making he opted for repeat PSA in 6 months, will call with those results.  If PSA rising significantly could consider MRI or repeat biopsy, but patient would like to avoid repeat biopsy if possible  Sondra Come, MD  [facility] [address] [place], [place] [address] [phone]

## 2024-03-05 ENCOUNTER — Other Ambulatory Visit: Payer: Self-pay

## 2024-03-05 ENCOUNTER — Other Ambulatory Visit: Payer: Self-pay | Admitting: Pharmacy Technician

## 2024-03-05 NOTE — Progress Notes (Signed)
 Specialty Pharmacy Refill Coordination Note  Andres Mullins is a 73 y.o. male contacted today regarding refills of specialty medication(s) Acalabrutinib Maleate (Calquence)   Patient requested (Patient-Rptd) Delivery   Delivery date: (Patient-Rptd) 03/11/24   Verified address: (Patient-Rptd) [address]      [place], [place] [address]   Medication will be filled on 03/10/24.

## 2024-03-21 ENCOUNTER — Other Ambulatory Visit: Payer: Self-pay

## 2024-03-21 ENCOUNTER — Ambulatory Visit (INDEPENDENT_AMBULATORY_CARE_PROVIDER_SITE_OTHER): Admitting: Internal Medicine

## 2024-03-21 VITALS — BP 122/76 | HR 91 | Temp 97.8°F | Resp 16 | Ht 66.0 in | Wt 165.1 lb

## 2024-03-21 DIAGNOSIS — B309 Viral conjunctivitis, unspecified: Secondary | ICD-10-CM | POA: Diagnosis not present

## 2024-03-21 NOTE — Patient Instructions (Signed)
Viral Conjunctivitis, Adult  Viral conjunctivitis is an inflammation of the conjunctiva. The conjunctiva is the clear membrane that covers the white part of the eye and the inner surface of the eyelid. The inflammation is caused by a viral infection. The blood vessels in the conjunctiva become large, causing the eye to become red or pink and often itchy and tearing. The inflammation usually starts in one eye and goes to the other in a day or two. Infections usually go away over 1-2 weeks. Viral conjunctivitis is contagious. This means it can be easily passed from one person to another. This condition is often called pink eye. What are the causes? This condition is caused by a virus. It can be spread by touching objects that have been contaminated with the virus, such as doorknobs or towels, and then touching your eye. It can also be passed through tiny droplets, such as from coughing or sneezing. What increases the risk? You are more likely to develop this condition if you have a cold or the flu, or are in close contact with a person who has pink eye. What are the signs or symptoms? Symptoms of this condition include: Redness in the eye. Tearing or watery eyes. Itchy and irritated eyes. Burning feeling in the eyes. Clear drainage from the eye. Swollen eyelids. A gritty feeling in the eye. Light sensitivity. This condition often occurs with other symptoms, such as nasal congestion, cough, and fever. How is this diagnosed? This condition is diagnosed with a medical history and physical exam. If you have discharge from your eye, the discharge may be tested for a virus or to rule out other causes of conjunctivitis. How is this treated? Viral conjunctivitis does not respond to medicines that kill bacteria (antibiotics). The condition most often goes away on its own in 1-2 weeks. If treatment is needed, it is aimed at relieving your symptoms and preventing the spread of infection. This may be done  with artificial tear drops, antihistamine drops, or other eye medicines. In rare cases, steroid eye drops or anti-herpes virus medicines may be prescribed. Follow these instructions at home: Medicines  Take or apply over-the-counter and prescription medicines only as told by your health care provider. Do not touch the edge of the eyelid with the eye-drop bottle or ointment tube when applying medicines to the affected eye. This will prevent the spread of the infection to the other eye or to other people. Eye care Avoid touching or rubbing your eyes. Apply a clean, cool, wet washcloth onto your eye for 10-20 minutes, 3-4 times per day, or as told by your health care provider. If you wear contact lenses, do notwear them until the inflammation is gone and your health care provider says it is safe to wear them again. Ask your health care provider how to disinfect or replace your contact lenses before using them again. Wear glasses until you can resume wearing contacts. Avoid wearing eye makeup until the inflammation is gone. Throw away any old eye cosmetics that may be contaminated. Gently wipe away any crusting from your eye with a wet washcloth or a cotton ball. General instructions Change or wash your pillowcase every day or as told by your health care provider. Do not share towels, pillowcases, washcloths, eye makeup, makeup brushes, eye drops, contact lenses, or eyeglasses. This may spread the infection. Wash your hands often with soap and water. Use paper towels to dry your hands. If soap and water are not available, use hand sanitizer. Avoid contact  with other people until your eye is no longer red and tearing, or as told by your health care provider. Keep all follow-up visits. Contact a health care provider if: Your symptoms do not improve with treatment, or they get worse. You have increased pain. Your vision becomes blurry. You have a fever. You have facial pain, redness, or  swelling. You have yellow or green drainage coming from your eye. You have new symptoms. Get help right away if: You develop severe pain. Your vision gets much worse. Summary Viral conjunctivitis is an inflammation of the conjunctiva. It usually goes away in 1-2 weeks. The condition is caused by a virus and is spread by touching contaminated objects or breathing in droplets from a cough or a sneeze. This condition is usually treated with medicines and cold compresses to relieve the symptoms. Because it is caused by a virus, it should not be treated with antibiotics. This condition is very contagious. To prevent infection, avoid close contact with others, wash your hands often, and do not share towels or washcloths. Contact a health care provider if your symptoms do not go away with treatment, or if you have blurry vision, facial swelling, or increased pain. This information is not intended to replace advice given to you by your health care provider. Make sure you discuss any questions you have with your health care provider. Document Revised: 12/21/2021 Document Reviewed: 12/21/2021 Elsevier Patient Education  2024 [employer].

## 2024-03-21 NOTE — Progress Notes (Signed)
   Acute Office Visit  Subjective:     Patient ID: Andres Mullins, male    DOB: 07-12-1951, 73 y.o.   MRN: [id number]  Chief Complaint  Patient presents with   Conjunctivitis    Right eye    HPI Patient is in today for right red eye. He woke up with it this morning. States it feels gritty and dry, like something is in it. No history of trauma. No ocular pain. No vision changes. Mild clear discharge, no crusting or purulent discharge. Did also have clear rhinorrhea this morning but no other symptoms. Worried because he had pansinuitis last year but no sinus pain/pressure or fevers currently.   Review of Systems  Constitutional:  Negative for chills and fever.  HENT:  Negative for congestion, ear pain, sinus pain and sore throat.   Eyes:  Positive for discharge and redness. Negative for blurred vision and pain.  Respiratory:  Negative for cough.         Objective:    BP 122/76 (Cuff Size: Normal)   Pulse 91   Temp 97.8 F (36.6 C) (Oral)   Resp 16   Ht 5\' 6"  (1.676 m)   Wt 165 lb 1.6 oz (74.9 kg)   SpO2 99%   BMI 26.65 kg/m    Physical Exam Constitutional:      Appearance: Normal appearance.  HENT:     Head: Normocephalic and atraumatic.     Right Ear: Tympanic membrane, ear canal and external ear normal.     Left Ear: Tympanic membrane, ear canal and external ear normal.     Nose: Nose normal.     Mouth/Throat:     Mouth: Mucous membranes are moist.     Pharynx: Oropharynx is clear.  Eyes:     Extraocular Movements: Extraocular movements intact.     Conjunctiva/sclera:     Right eye: Right conjunctiva is injected.     Left eye: Left conjunctiva is not injected. No chemosis, exudate or hemorrhage.    Pupils: Pupils are equal, round, and reactive to light.  Neurological:     Mental Status: He is alert.     No results found for any visits on 03/21/24.      Assessment & Plan:   1. Viral conjunctivitis of right eye (Primary): Unilateral mild ocular redness  x 1 day. No pain or vision changes. Consistent with viral conjunctivitis, will treat with lubricating drops and he will call if symptoms worsen or fail to improve. Info on condition sent to patient via mychart for review.    Return if symptoms worsen or fail to improve.  Rockney Cid, DO

## 2024-03-27 ENCOUNTER — Other Ambulatory Visit (HOSPITAL_COMMUNITY): Payer: Self-pay

## 2024-03-27 ENCOUNTER — Other Ambulatory Visit: Payer: Self-pay

## 2024-03-28 ENCOUNTER — Other Ambulatory Visit: Payer: Self-pay

## 2024-03-28 ENCOUNTER — Other Ambulatory Visit (HOSPITAL_COMMUNITY): Payer: Self-pay

## 2024-03-28 NOTE — Progress Notes (Signed)
 Specialty Pharmacy Refill Coordination Note  Andres Mullins is a 73 y.o. male contacted today regarding refills of specialty medication(s) No data recorded  Patient requested (Patient-Rptd) Delivery   Delivery date: (Patient-Rptd) 04/08/24   Verified address: (Patient-Rptd) [address]    [place], [place] [address]   Medication will be filled on 04/07/24.

## 2024-04-23 ENCOUNTER — Other Ambulatory Visit: Payer: Self-pay

## 2024-04-28 ENCOUNTER — Ambulatory Visit
Admission: RE | Admit: 2024-04-28 | Discharge: 2024-04-28 | Disposition: A | Discharge status: Home / Self Care | Source: Ambulatory Visit | Attending: Internal Medicine | Admitting: Internal Medicine

## 2024-04-28 DIAGNOSIS — C9110 Chronic lymphocytic leukemia of B-cell type not having achieved remission: Secondary | ICD-10-CM | POA: Insufficient documentation

## 2024-04-28 MED ORDER — IOHEXOL 300 MG/ML  SOLN
85.0000 mL | Freq: Once | INTRAMUSCULAR | Status: AC | PRN
  Administered 2024-04-28: 85 mL via INTRAVENOUS

## 2024-04-29 ENCOUNTER — Other Ambulatory Visit: Payer: Self-pay

## 2024-04-29 ENCOUNTER — Other Ambulatory Visit: Payer: Self-pay | Admitting: Internal Medicine

## 2024-04-29 DIAGNOSIS — C9110 Chronic lymphocytic leukemia of B-cell type not having achieved remission: Secondary | ICD-10-CM

## 2024-04-29 MED ORDER — CALQUENCE 100 MG PO TABS
ORAL_TABLET | ORAL | 2 refills | Status: DC
  Filled 2024-04-29: qty 40, 28d supply, fill #0
  Filled 2024-05-27: qty 40, 28d supply, fill #1
  Filled 2024-06-24: qty 40, 28d supply, fill #2

## 2024-04-29 NOTE — Progress Notes (Signed)
 Specialty Pharmacy Refill Coordination Note  Andres Mullins is a 73 y.o. male contacted today regarding refills of specialty medication(s) Acalabrutinib  Maleate (Calquence )   Patient requested Delivery   Delivery date: 05/06/24   Verified address: [address]    [place], [place] [address]   Medication will be filled on 05/05/24.

## 2024-04-29 NOTE — Progress Notes (Signed)
 Specialty Pharmacy Ongoing Clinical Assessment Note  Andres Mullins is a 73 y.o. male who is being followed by the specialty pharmacy service for [facility] Oncology   Patient's specialty medication(s) reviewed today: Acalabrutinib  Maleate (Calquence )   Missed doses in the last 4 weeks: 0   Patient/Caregiver did not have any additional questions or concerns.   Therapeutic benefit summary: Patient is achieving benefit   Adverse events/side effects summary: No adverse events/side effects (headaches tolerable (drinks coffee in the morning to help), GI side effects and sleep issues have resolved)   Patient's therapy is appropriate to: Continue    Goals Addressed             This Visit's Progress    Slow Disease Progression   On track    Patient is on track. Patient will maintain adherence. Stable disease with no evidence of progression per office visit on 02/06/24.         Follow up: 6 months  [facility]

## 2024-05-05 ENCOUNTER — Other Ambulatory Visit: Payer: Self-pay

## 2024-05-13 ENCOUNTER — Inpatient Hospital Stay (HOSPITAL_BASED_OUTPATIENT_CLINIC_OR_DEPARTMENT_OTHER): Admitting: Internal Medicine

## 2024-05-13 ENCOUNTER — Encounter: Payer: Self-pay | Admitting: Internal Medicine

## 2024-05-13 ENCOUNTER — Inpatient Hospital Stay: Attending: Internal Medicine

## 2024-05-13 VITALS — BP 117/87 | HR 77 | Temp 96.8°F | Resp 16 | Ht 66.0 in | Wt 164.9 lb

## 2024-05-13 DIAGNOSIS — R197 Diarrhea, unspecified: Secondary | ICD-10-CM | POA: Insufficient documentation

## 2024-05-13 DIAGNOSIS — Z9049 Acquired absence of other specified parts of digestive tract: Secondary | ICD-10-CM | POA: Insufficient documentation

## 2024-05-13 DIAGNOSIS — C9110 Chronic lymphocytic leukemia of B-cell type not having achieved remission: Secondary | ICD-10-CM | POA: Diagnosis not present

## 2024-05-13 DIAGNOSIS — N400 Enlarged prostate without lower urinary tract symptoms: Secondary | ICD-10-CM | POA: Diagnosis not present

## 2024-05-13 DIAGNOSIS — Z818 Family history of other mental and behavioral disorders: Secondary | ICD-10-CM | POA: Insufficient documentation

## 2024-05-13 DIAGNOSIS — R5383 Other fatigue: Secondary | ICD-10-CM | POA: Insufficient documentation

## 2024-05-13 DIAGNOSIS — M2550 Pain in unspecified joint: Secondary | ICD-10-CM | POA: Insufficient documentation

## 2024-05-13 DIAGNOSIS — J449 Chronic obstructive pulmonary disease, unspecified: Secondary | ICD-10-CM | POA: Insufficient documentation

## 2024-05-13 DIAGNOSIS — Z8249 Family history of ischemic heart disease and other diseases of the circulatory system: Secondary | ICD-10-CM | POA: Insufficient documentation

## 2024-05-13 DIAGNOSIS — F17210 Nicotine dependence, cigarettes, uncomplicated: Secondary | ICD-10-CM | POA: Insufficient documentation

## 2024-05-13 DIAGNOSIS — Z8261 Family history of arthritis: Secondary | ICD-10-CM | POA: Diagnosis not present

## 2024-05-13 DIAGNOSIS — I2510 Atherosclerotic heart disease of native coronary artery without angina pectoris: Secondary | ICD-10-CM | POA: Diagnosis not present

## 2024-05-13 DIAGNOSIS — Z86711 Personal history of pulmonary embolism: Secondary | ICD-10-CM | POA: Diagnosis not present

## 2024-05-13 DIAGNOSIS — Z79899 Other long term (current) drug therapy: Secondary | ICD-10-CM | POA: Diagnosis not present

## 2024-05-13 DIAGNOSIS — M791 Myalgia, unspecified site: Secondary | ICD-10-CM | POA: Insufficient documentation

## 2024-05-13 DIAGNOSIS — E785 Hyperlipidemia, unspecified: Secondary | ICD-10-CM | POA: Diagnosis not present

## 2024-05-13 LAB — CBC WITH DIFFERENTIAL (CANCER CENTER ONLY)
Abs Immature Granulocytes: 0.04 10*3/uL (ref 0.00–0.07)
Basophils Absolute: 0.1 10*3/uL (ref 0.0–0.1)
Basophils Relative: 1 %
Eosinophils Absolute: 0.4 10*3/uL (ref 0.0–0.5)
Eosinophils Relative: 4 %
HCT: 45.2 % (ref 39.0–52.0)
Hemoglobin: 16 g/dL (ref 13.0–17.0)
Immature Granulocytes: 1 %
Lymphocytes Relative: 19 %
Lymphs Abs: 1.7 10*3/uL (ref 0.7–4.0)
MCH: 31.9 pg (ref 26.0–34.0)
MCHC: 35.4 g/dL (ref 30.0–36.0)
MCV: 90.2 fL (ref 80.0–100.0)
Monocytes Absolute: 0.7 10*3/uL (ref 0.1–1.0)
Monocytes Relative: 8 %
Neutro Abs: 5.9 10*3/uL (ref 1.7–7.7)
Neutrophils Relative %: 67 %
Platelet Count: 247 10*3/uL (ref 150–400)
RBC: 5.01 MIL/uL (ref 4.22–5.81)
RDW: 13.5 % (ref 11.5–15.5)
WBC Count: 8.8 10*3/uL (ref 4.0–10.5)
nRBC: 0 % (ref 0.0–0.2)

## 2024-05-13 LAB — CMP (CANCER CENTER ONLY)
ALT: 23 U/L (ref 0–44)
AST: 19 U/L (ref 15–41)
Albumin: 3.9 g/dL (ref 3.5–5.0)
Alkaline Phosphatase: 76 U/L (ref 38–126)
Anion gap: 8 (ref 5–15)
BUN: 10 mg/dL (ref 8–23)
CO2: 21 mmol/L — ABNORMAL LOW (ref 22–32)
Calcium: 9.1 mg/dL (ref 8.9–10.3)
Chloride: 108 mmol/L (ref 98–111)
Creatinine: 0.8 mg/dL (ref 0.61–1.24)
GFR, Estimated: >60 mL/min (ref >60)
Glucose, Bld: 99 mg/dL (ref 70–99)
Potassium: 3.8 mmol/L (ref 3.5–5.1)
Sodium: 137 mmol/L (ref 135–145)
Total Bilirubin: 0.9 mg/dL (ref 0.0–1.2)
Total Protein: 6.4 g/dL — ABNORMAL LOW (ref 6.5–8.1)

## 2024-05-13 LAB — MAGNESIUM: Magnesium: 2.1 mg/dL (ref 1.7–2.4)

## 2024-05-13 LAB — URIC ACID: Uric Acid, Serum: 5.9 mg/dL (ref 3.7–8.6)

## 2024-05-13 LAB — LACTATE DEHYDROGENASE: LDH: 109 U/L (ref 98–192)

## 2024-05-13 NOTE — Progress Notes (Signed)
 [facility] OFFICE PROGRESS NOTE  Patient Care Team: Rockney Cid, DO as PCP - General (Internal Medicine) End, Veryl Gottron, MD as PCP - Cardiology (Cardiology) Lada, Ernestine Heads, MD as Attending Physician (Family Medicine) Artemio Larry, MD (Dermatology) Lawerence Pressman, MD as Consulting Physician (Urology) Gwyn Leos, MD as Consulting Physician (Hematology and Oncology) Gwyn Leos, MD as Consulting Physician (Oncology)   SUMMARY OF ONCOLOGIC HISTORY:  Oncology History Overview Note  # 2016- CLL-[flow] CD5 (+)/CD23 (+) clonal B-cell population, CLL/SLL phenotype, 18% of leukocytes, <5,000/uL, CD38 positive   # NOV 2020 PET [incidental]- Generalized Lymphadenopathy- left supraclav/ bil ax LN L> R; mesenteric LN; pelvic LN; FEB 5th 2021- 2.5cm left supraclav; Left ax LN- 2.4cm.   AUG 2021-Head and neck: The brain demonstrates symmetric appearing diffuse physiologic uptake without focal finding to suggest abnormality. In the left posterior and medial maxillary cavity, there is an area of mucoid deposition seen within SUV max of 28.8. This was seen in the previous study of November. The specific bony erosion, clear-cut soft tissue lesion is not detected. This probably represents a chronic sinus disease focus. The bilateral optic nerves and extraocular muscles to the largest nodes are seen on axial image 98. 1 now demonstrates a short axis measurement of 3.3 cm and the other more medially, a short axis measurement of 2.5 cm. Demonstrate symmetric increased uptake. The left, much greater than right, jugulodigastric and supraclavicular nodes have increased significantly in size and avidity since the previous study. Both of these lymph nodes demonstrate an SUV max of 3.9. No largest lymph node in the right subclavicular space is seen on axial image 104, demonstrating an SUV max of 3.3, and a short axis measurement of 1.4 cm.   On axial image 102, a right  paratracheal lymph node is again identified with increased avidity, located immediately posterior to the right thyroid pole. This area was noted previously, but now increased avidity is seen, with an SUV max of 8.6 in this area. Multiple smaller, and less avid lymph nodes scattered throughout the left greater than right neck are seen, as well as supraclavicular and infraclavicular areas.   Thorax: Significant axillary lymph nodes have increased significantly in size. These are also left greater than right, with the largest on the left seen on image #114 with a short axis measurement of 2.2 cm and an SUV max of 4.3. The largest on the right as seen on image 111, demonstrating a short axis of 1.4 cm and an SUV max of 4.0. Too numerous to count smaller and similarly avid lymph nodes in both axillae are also noted. These are all larger than previously seen and in general, more avid.  Numerous small AP window and lesser mediastinal lymph nodes are seen with minimal increased avidity, appearing similar in size and avidity to the previous study. No other significant mediastinal adenopathy. No hilar adenopathy detected. These likely also represent lymphoproliferative disease.  Hyperinflation of COPD, bibasilar dependent atelectasis, some paucity of lung tissue and biapical scarring are noted without other focal pulmonary mass or nodule, or acute pulmonary parenchymal disease. Physiologic uptake in the left heart is seen, as expected. Small hiatal hernia is again identified.   Abdomen pelvis: Physiologic uptake in the liver, gut and urinary tract is again noted. No focal area of asymmetric uptake in the structures is seen. The spleen also demonstrates generalized increased uptake and stable splenomegaly, up to 14.2 cm. On image 198, a preaortic lymph node is detected of  increased size since the previous study, now measuring up to 1.2 cm short axis.  Several large lymph nodes on image 203 of the mesentery are noted.  The largest of these, on image 201 and ureters up to 1.8 cm short axis. Despite the fact that measurable avidity is not detected in these are the surrounding lymph nodes, these are doubtless involved in the lymphoproliferative disease described, and appear larger. Multiple smaller lymph nodes scattered throughout the mesentery are also seen.  Grossly enlarged right greater than left common iliac chain lymph nodes are again seen, throughout the pelvis and into the inguinal areas bilaterally. The most avid on the right is seen on image #260 to, demonstrates a short axis of 2.5 cm, and an SUV max of 4.9. The largest on the left is identified image #272, demonstrates a short axis of 2.0 cm, and an SUV max of 4.7. These are similar to the previously noted values.  Right greater than left inguinal adenopathy is seen, with the largest lymph node seen on the right image 276, demonstrating a short axis of 1.5 cm and an SUV max of 3.9. This is larger in size and avidity than previously seen. There are bilateral enlarged lymph nodes associated with the external iliac vessels on image 277. These measure 2.0 cm on the right in short axis, with an SUV max of 3.9.  On the left, image 278, the external iliac lymph node is larger at 1.8 cm short axis, but with an SUV max of only 3.8. Lesser avid and smaller lymph nodes on both sides are also detected.   Musculoskeletal: Scattered, insignificant uptake is detected in the muscles especially in the bilateral joints, without focal finding to suggest lymphoproliferative disease.   IMPRESSIONS:  Significant increase in numbers, short axis size, and to a large extent, avidity -in the lymph nodes of the left greater than right neck, supraclavicular and infraclavicular areas, bilateral axillary lymph nodes and common iliac chain, external iliac chain and inguinal lymph nodes, consistent with worsening lymphoproliferative disease. The mediastinal lymph nodes seen are still present and  unchanged in size or [significantly] in avidity.    [place] - Bx- SLL/CLL.July 2022-Discussed with Dr. Asquith-oncologist, [place]-reviewed the pathology of the lymph node biopsy in 2021-positive for SLL; negative for cyclin D1/1114 translocation.    # July 20th, 2022- GAZYVA  #1 Georgetta Kinnier plan ading VENATOCLAX after 2 cycles/de-bulking] s/p GAYZYVA [x6]- Feb 2023 - cycle #1  Venatoclax- x1 month- HELD sec to repeated hospitalization  # DISCONTINUED VENATOCLAX [sec to intolearnce]  # PE acute [NOV 2022] provoked--currently s/p  Eliquis  x 6 months [end of  April 2023].    # MAY 1st, 2023- START ACALABRUTINIB  100 mg BID  # December 2022-pericarditis/effusion needing pericardiocentesis; FEB 2023-acute respiratory failure-hospitalization-? Etiology [s/p ID; Pul; Dr.A; cards- [facility]]  # Lung cancer screening- on [facility]  # SURVIVORSHIP:   # GENETICS:   DIAGNOSIS:   STAGE:         ;  GOALS:  CURRENT/MOST RECENT THERAPY :       CLL (chronic lymphocytic leukemia) (HCC)  10/29/2019 Initial Diagnosis   CLL (chronic lymphocytic leukemia) (HCC)   06/15/2021 -  Chemotherapy   Patient is on Treatment Plan : LYMPHOMA CLL/SLL Venetoclax  + Obinutuzumab  q28d      INTERVAL HISTORY: Ambulating independently. With wife.   A pleasant 73 year-old male patient with CLL/SLL currently most currently on acalabrutinib   is here for follow-up with CT CAP   Patient is doing great he is just  wanting to see how his lab results are for today. He has no new questions for the doctor today.  Denies any wheezing.  Denies any worsening cough.  No recent hospitalization. Otherwise denies any lumps or bumps.  No fevers.  No chills.    Review of Systems  Constitutional:  Positive for malaise/fatigue. Negative for chills, diaphoresis, fever and weight loss.  HENT:  Negative for nosebleeds and sore throat.   Eyes:  Negative for double vision.  Respiratory:  Negative for cough, hemoptysis, sputum production, shortness of breath  and wheezing.   Cardiovascular:  Negative for chest pain, palpitations, orthopnea and leg swelling.  Gastrointestinal:  Negative for abdominal pain, blood in stool, constipation, diarrhea, heartburn, melena, nausea and vomiting.  Genitourinary:  Negative for dysuria, frequency and urgency.  Musculoskeletal:  Positive for joint pain and myalgias. Negative for back pain.  Skin: Negative.  Negative for itching and rash.  Neurological:  Negative for dizziness, tingling, focal weakness, weakness and headaches.  Endo/Heme/Allergies:  Does not bruise/bleed easily.  Psychiatric/Behavioral:  Negative for depression. The patient is not nervous/anxious and does not have insomnia.      PAST MEDICAL HISTORY :  Past Medical History:  Diagnosis Date   Atherosclerosis of coronary artery 09/28/2016   Chest CT Nov 2017   Atrial tachycardia (HCC)    CLL (chronic lymphocytic leukemia) (HCC)    Elevated C-reactive protein (CRP)    Elevated PSA    Fractured sternum 1999   Herpes    History of rib fracture 1999   multiple   Hyperlipidemia    Psoriasis    Pulmonary embolism (HCC)    Rheumatoid factor positive 02/2014   Rosacea    Thoracic aortic atherosclerosis (HCC) 09/28/2016   Chest CT Nov 2017   Tobacco abuse    Vitamin D  deficiency disease     PAST SURGICAL HISTORY :   Past Surgical History:  Procedure Laterality Date   APPENDECTOMY  11/27/1972   IR REMOVAL TUN ACCESS W/ PORT W/O FL MOD SED  03/20/2022   PERICARDIOCENTESIS N/A 10/03/2021   Procedure: PERICARDIOCENTESIS;  Surgeon: Lucendia Rusk, MD;  Location: [facility] INVASIVE CV LAB;  Service: Cardiovascular;  Laterality: N/A;   PORTA CATH INSERTION N/A 06/06/2021   Procedure: PORTA CATH INSERTION;  Surgeon: Celso College, MD;  Location: [facility] INVASIVE CV LAB;  Service: Cardiovascular;  Laterality: N/A;   SHOULDER ARTHROSCOPY  10/23/2016   Procedure: ARTHROSCOPY SHOULDER WITH DISTAL CLAVICLE EXCISION, PARTIAL ACROMIONECTOMY, AND  DEBRIDEMENT;  Surgeon: Marlynn Singer, MD;  Location: [facility] ORS;  Service: Orthopedics;;   TIBIA FRACTURE SURGERY Left 11/27/2002   titanium rod, ORIF   TRIGGER FINGER RELEASE Right 04/27/2023   rt ring finger/[facility]    FAMILY HISTORY :   Family History  Problem Relation Age of Onset   Heart disease Father    Osteoarthritis Father    Alzheimer's disease Father    Cancer Neg Hx    Diabetes Neg Hx    Hypertension Neg Hx    Stroke Neg Hx    COPD Neg Hx     SOCIAL HISTORY:   Social History   Tobacco Use   Smoking status: Every Day    Current packs/day: 0.50    Average packs/day: 0.5 packs/day for 45.0 years (22.5 ttl pk-yrs)    Types: Cigarettes   Smokeless tobacco: Never   Tobacco comments:    11/6- currently smoking 5 cigarettes a day   Vaping Use   Vaping status: Never Used  Substance Use Topics   Alcohol use: Yes    Alcohol/week: 3.0 standard drinks of alcohol    Types: 3 Glasses of wine per week    Comment: a couple glasses of wine a week   Drug use: No    ALLERGIES:  is allergic to gluten meal.  MEDICATIONS:  Current Outpatient Medications  Medication Sig Dispense Refill   acalabrutinib  maleate (CALQUENCE ) 100 MG tablet Take 1 tablet (100mg ) by mouth daily in the morning and 1 tablet (100mg ) in evening on Mondays, Wednesdays, and Fridays. 40 tablet 2   cholecalciferol (VITAMIN D3) 25 MCG (1000 UNIT) tablet Take 1,000 Units by mouth daily.     Roflumilast  (ZORYVE ) 0.3 % CREA Apply 1 Application topically at bedtime. Qhs to psoriasis on legs and arms 60 g 6   No current facility-administered medications for this visit.    PHYSICAL EXAMINATION: ECOG PERFORMANCE STATUS: 0 - Asymptomatic  BP 117/87 (BP Location: Left Arm, Patient Position: Sitting, Cuff Size: Normal)   Pulse 77   Temp (!) 96.8 F (36 C) (Tympanic)   Resp 16   Ht 5' 6 (1.676 m)   Wt 164 lb 14.4 oz (74.8 kg)   SpO2 97%   BMI 26.62 kg/m   Filed Weights   05/13/24 1327  Weight: 164 lb 14.4  oz (74.8 kg)    No significant lymphadenopathy lymphadenopathy in the neck/underarm.  Physical Exam HENT:     Head: Normocephalic and atraumatic.     Mouth/Throat:     Pharynx: No oropharyngeal exudate.   Eyes:     Pupils: Pupils are equal, round, and reactive to light.    Cardiovascular:     Rate and Rhythm: Normal rate and regular rhythm.  Pulmonary:     Effort: No respiratory distress.     Breath sounds: No wheezing.  Abdominal:     General: Bowel sounds are normal. There is no distension.     Palpations: Abdomen is soft. There is no mass.     Tenderness: There is no abdominal tenderness. There is no guarding or rebound.   Musculoskeletal:        General: No tenderness. Normal range of motion.     Cervical back: Normal range of motion and neck supple.   Skin:    General: Skin is warm.   Neurological:     Mental Status: He is alert and oriented to person, place, and time.   Psychiatric:        Mood and Affect: Affect normal.     LABORATORY DATA:  I have reviewed the data as listed    Component Value Date/Time   NA 137 05/13/2024 1320   NA 139 10/27/2021 0857   K 3.8 05/13/2024 1320   CL 108 05/13/2024 1320   CO2 21 (L) 05/13/2024 1320   GLUCOSE 99 05/13/2024 1320   BUN 10 05/13/2024 1320   BUN 16 10/27/2021 0857   CREATININE 0.80 05/13/2024 1320   CREATININE 0.84 10/06/2019 0000   CALCIUM  9.1 05/13/2024 1320   PROT 6.4 (L) 05/13/2024 1320   ALBUMIN 3.9 05/13/2024 1320   AST 19 05/13/2024 1320   ALT 23 05/13/2024 1320   ALKPHOS 76 05/13/2024 1320   BILITOT 0.9 05/13/2024 1320   GFRNONAA >60 05/13/2024 1320   GFRNONAA 91 10/06/2019 0000   GFRAA >60 12/26/2019 0842   GFRAA 106 10/06/2019 0000    No results found for: SPEP, UPEP  Lab Results  Component Value Date   WBC 8.8 05/13/2024  NEUTROABS 5.9 05/13/2024   HGB 16.0 05/13/2024   HCT 45.2 05/13/2024   MCV 90.2 05/13/2024   PLT 247 05/13/2024      Chemistry      Component Value  Date/Time   NA 137 05/13/2024 1320   NA 139 10/27/2021 0857   K 3.8 05/13/2024 1320   CL 108 05/13/2024 1320   CO2 21 (L) 05/13/2024 1320   BUN 10 05/13/2024 1320   BUN 16 10/27/2021 0857   CREATININE 0.80 05/13/2024 1320   CREATININE 0.84 10/06/2019 0000      Component Value Date/Time   CALCIUM  9.1 05/13/2024 1320   ALKPHOS 76 05/13/2024 1320   AST 19 05/13/2024 1320   ALT 23 05/13/2024 1320   BILITOT 0.9 05/13/2024 1320       RADIOGRAPHIC STUDIES: I have personally reviewed the radiological images as listed and agreed with the findings in the report. No results found.   ASSESSMENT & PLAN:   .CLL (chronic lymphocytic leukemia) (HCC) # SLL/CLL-patient currently on acalabrutinib  100 mg twice a day [since start of May 2023]- tolerating well except mild-moderate-severe fatigue/insominia [see below]. Continue acalbrutinib, but SKIP the PM dose T/Thr/Sat & Sun.  # Clinically, no evidence of any progression.  Labs within normal limits. JUNE 2nd, 2025- CT CAP- No pathologically enlarged lymph nodes within the chest, abdomen or pelvis and no splenomegaly-  stable.   # Myalgias/Fatigue/ Insomnia: ? Related Acalabrutinib - Continue acalbrutinib, but SKIP the PM dose 4 days. s/p Exercise/CARE program-  stable.    # Diarrhea: G-1- ? Related to Acalabrutinib - monitor for now.-   stable.   # Psoriasis/? Arthritis-   on topical zorvye-  stable.   # Active smoker: recommend quitting smoking; cutting down-  stable.  # Enlarged prostate- PSA 7.0 [MAY 2024-]- s/p urology evaluation [Dr. Sninski/Dr. Wolf's office]- NOV 2024-PSA-5.3- improving- currently on surveillance- considering prostate MRI or biopsy if remains concerning. Recommend continued follow up Urology- stable.   # Secondary malignancies: dermatology [Dr.Stewart]- continue surveillance- stable; recommend colonoscopy- [never had one; prior cologard sec to prep]  # IV access: PIV  # Vaccines: ok with shingrix # [first in April,  2023];  recommend -flu/RSV/ and Covid  # DISPOSITION: # follow up in 3 months- MD; labs- cbc/cmp; URIC ACID; LDH;mag; quantitative immunoglobulins;  Dr.B  # I reviewed the blood work- with the patient in detail; also reviewed the imaging independently [as summarized above]; and with the patient in detail.     Orders Placed This Encounter  Procedures   CBC with Differential (Cancer Center Only)    Standing Status:   Future    Expected Date:   08/13/2024    Expiration Date:   11/11/2024   CMP (Cancer Center only)    Standing Status:   Future    Expected Date:   08/13/2024    Expiration Date:   11/11/2024   Uric acid    Standing Status:   Future    Expected Date:   08/13/2024    Expiration Date:   11/11/2024   Lactate dehydrogenase    Standing Status:   Future    Expected Date:   08/13/2024    Expiration Date:   11/11/2024   Magnesium    Standing Status:   Future    Expected Date:   08/13/2024    Expiration Date:   11/11/2024   Immunoglobulins, QN, A/E/G/M    Standing Status:   Future    Expected Date:   08/13/2024  Expiration Date:   11/11/2024       Gwyn Leos, MD 05/13/2024 2:21 PM

## 2024-05-13 NOTE — Assessment & Plan Note (Addendum)
#   SLL/CLL-patient currently on acalabrutinib  100 mg twice a day [since start of May 2023]- tolerating well except mild-moderate-severe fatigue/insominia [see below]. Continue acalbrutinib, but SKIP the PM dose T/Thr/Sat & Sun.  # Clinically, no evidence of any progression.  Labs within normal limits. JUNE 2nd, 2025- CT [facility]- No pathologically enlarged lymph nodes within the chest, abdomen or pelvis and no splenomegaly-  stable.   # Myalgias/Fatigue/ Insomnia: ? Related Acalabrutinib - Continue acalbrutinib, but SKIP the PM dose 4 days. s/p Exercise/CARE program-  stable.    # Diarrhea: G-1- ? Related to Acalabrutinib - monitor for now.-   stable.   # Psoriasis/? Arthritis-   on topical zorvye-  stable.   # Active smoker: recommend quitting smoking; cutting down-  stable.  # Enlarged prostate- PSA 7.0 [MAY 2024-]- s/p urology evaluation [Dr. Sninski/Dr. Wolf's office]- NOV 2024-PSA-5.3- improving- currently on surveillance- considering prostate MRI or biopsy if remains concerning. Recommend continued follow up Urology- stable.   # Secondary malignancies: dermatology [Dr.Stewart]- continue surveillance- stable; recommend colonoscopy- [never had one; prior cologard sec to prep]  # IV access: PIV  # Vaccines: ok with shingrix # [first in April, 2023];  recommend -flu/RSV/ and Covid  # DISPOSITION: # follow up in 3 months- MD; labs- cbc/cmp; URIC ACID; LDH;mag; quantitative immunoglobulins;  Dr.B  # I reviewed the blood work- with the patient in detail; also reviewed the imaging independently [as summarized above]; and with the patient in detail.

## 2024-05-13 NOTE — Progress Notes (Signed)
 CT CAP 04/28/24.

## 2024-05-15 LAB — IMMUNOGLOBULINS A/E/G/M, SERUM
IgA: 77 mg/dL (ref 61–437)
IgE (Immunoglobulin E), Serum: 9 [IU]/mL (ref 6–495)
IgG (Immunoglobin G), Serum: 530 mg/dL — ABNORMAL LOW (ref 603–1613)
IgM (Immunoglobulin M), Srm: 13 mg/dL — ABNORMAL LOW (ref 20–172)

## 2024-05-26 ENCOUNTER — Other Ambulatory Visit (HOSPITAL_COMMUNITY): Payer: Self-pay

## 2024-05-27 ENCOUNTER — Other Ambulatory Visit: Payer: Self-pay

## 2024-05-27 ENCOUNTER — Encounter (INDEPENDENT_AMBULATORY_CARE_PROVIDER_SITE_OTHER): Payer: Self-pay

## 2024-05-27 NOTE — Progress Notes (Signed)
 Specialty Pharmacy Refill Coordination Note  Andres Mullins is a 73 y.o. male contacted today regarding refills of specialty medication(s) Acalabrutinib  Maleate (Calquence )   Patient requested (Patient-Rptd) Delivery   Delivery date: 05/29/24   Verified address: (Patient-Rptd) [address]     [place], [place] [address]   Medication will be filled on 05/28/24.

## 2024-06-11 ENCOUNTER — Encounter: Payer: Self-pay | Admitting: Urology

## 2024-06-17 ENCOUNTER — Other Ambulatory Visit (HOSPITAL_COMMUNITY): Payer: Self-pay

## 2024-06-23 ENCOUNTER — Other Ambulatory Visit (HOSPITAL_COMMUNITY): Payer: Self-pay

## 2024-06-23 ENCOUNTER — Encounter (INDEPENDENT_AMBULATORY_CARE_PROVIDER_SITE_OTHER): Payer: Self-pay

## 2024-06-24 ENCOUNTER — Other Ambulatory Visit: Payer: Self-pay

## 2024-06-24 NOTE — Progress Notes (Signed)
 Specialty Pharmacy Refill Coordination Note  Andres Mullins is a 73 y.o. male contacted today regarding refills of specialty medication(s) Acalabrutinib  Maleate (Calquence )   Patient requested Delivery   Delivery date: 06/26/24   Verified address: [address]      [place], [place] [address]   Medication will be filled on 06/25/24.

## 2024-07-13 ENCOUNTER — Encounter (INDEPENDENT_AMBULATORY_CARE_PROVIDER_SITE_OTHER): Payer: Self-pay

## 2024-07-14 ENCOUNTER — Other Ambulatory Visit: Payer: Self-pay | Admitting: Internal Medicine

## 2024-07-14 ENCOUNTER — Other Ambulatory Visit: Payer: Self-pay

## 2024-07-14 DIAGNOSIS — C9110 Chronic lymphocytic leukemia of B-cell type not having achieved remission: Secondary | ICD-10-CM

## 2024-07-14 MED ORDER — CALQUENCE 100 MG PO TABS
ORAL_TABLET | ORAL | 2 refills | Status: DC
  Filled 2024-07-14: qty 40, 28d supply, fill #0
  Filled 2024-07-14: qty 40, 42d supply, fill #0
  Filled 2024-08-18: qty 40, 28d supply, fill #1
  Filled 2024-09-16: qty 40, 28d supply, fill #2

## 2024-07-14 NOTE — Progress Notes (Signed)
 Specialty Pharmacy Refill Coordination Note  Andres Mullins is a 73 y.o. male contacted today regarding refills of specialty medication(s) Acalabrutinib  Maleate (Calquence )   Patient requested (Patient-Rptd) Delivery   Delivery date: 07/16/24   Verified address: (Patient-Rptd) [address]      [place], [place] [address]   Medication will be filled on 08.19.25.   Patient has enough medication on hand until 9.4.25.

## 2024-07-23 ENCOUNTER — Ambulatory Visit (INDEPENDENT_AMBULATORY_CARE_PROVIDER_SITE_OTHER)

## 2024-07-23 DIAGNOSIS — L578 Other skin changes due to chronic exposure to nonionizing radiation: Secondary | ICD-10-CM | POA: Diagnosis not present

## 2024-07-23 DIAGNOSIS — D229 Melanocytic nevi, unspecified: Secondary | ICD-10-CM

## 2024-07-23 DIAGNOSIS — W908XXA Exposure to other nonionizing radiation, initial encounter: Secondary | ICD-10-CM

## 2024-07-23 DIAGNOSIS — Z856 Personal history of leukemia: Secondary | ICD-10-CM

## 2024-07-23 DIAGNOSIS — Z1283 Encounter for screening for malignant neoplasm of skin: Secondary | ICD-10-CM | POA: Diagnosis not present

## 2024-07-23 DIAGNOSIS — D485 Neoplasm of uncertain behavior of skin: Secondary | ICD-10-CM | POA: Diagnosis not present

## 2024-07-23 DIAGNOSIS — L821 Other seborrheic keratosis: Secondary | ICD-10-CM

## 2024-07-23 DIAGNOSIS — L814 Other melanin hyperpigmentation: Secondary | ICD-10-CM

## 2024-07-23 DIAGNOSIS — Z9225 Personal history of immunosupression therapy: Secondary | ICD-10-CM

## 2024-07-23 DIAGNOSIS — L719 Rosacea, unspecified: Secondary | ICD-10-CM | POA: Diagnosis not present

## 2024-07-23 DIAGNOSIS — D1801 Hemangioma of skin and subcutaneous tissue: Secondary | ICD-10-CM

## 2024-07-23 DIAGNOSIS — D489 Neoplasm of uncertain behavior, unspecified: Secondary | ICD-10-CM

## 2024-07-23 MED ORDER — DOXYCYCLINE MONOHYDRATE 100 MG PO TABS: 100.0000 mg | ORAL_TABLET | Freq: Two times a day (BID) | ORAL | 0 refills | Status: DC

## 2024-07-23 NOTE — Progress Notes (Signed)
 Follow-Up Visit   Subjective  Andres Mullins is a 73 y.o. male who presents for the following: Patient has a post of concern on his nose that he would like checked. Lesion has been present for about 10 days. Also with rosacea with mild flare.   The following portions of the chart were reviewed this encounter and updated as appropriate: medications, allergies, medical history  History of CLL   Review of Systems:  No other skin or systemic complaints except as noted in HPI or Assessment and Plan.  Objective  Well appearing patient in no apparent distress; mood and affect are within normal limits.    A focused examination was performed of the following areas: Face, upper extremities - 6-20 mm pigmented macules that are tan to brown in color and are somewhat non-uniform in shape and concentrated in the sun-exposed areas - Actinic Elastosis: chronic sun damage: dyspigmentation, telangiectasia, and wrinkling - Multiple stuck-on brown, tan and grey papillated papules and plaques  - erythema of cheeks - few scattered papules   Relevant exam findings are noted in the Assessment and Plan.  Left Tip of Nose 6mm scaly pink papule   Assessment & Plan    BENIGN SKIN FINDINGS  - Lentigines  - Seborrheic keratoses  - Nevus/Multiple Benign Nevi  - Reassurance provided regarding the benign appearance of lesions noted on exam today; no treatment is indicated in the absence of symptoms/changes. - Reinforced importance of photoprotective strategies including liberal and frequent sunscreen use of a broad-spectrum SPF 30 or greater, use of protective clothing, and sun avoidance for prevention of cutaneous malignancy and photoaging.  Counseled patient on the importance of regular self-skin monitoring as well as routine clinical skin examinations as scheduled.   ACTINIC DAMAGE - Chronic condition, secondary to cumulative UV/sun exposure - Recommend daily broad spectrum sunscreen SPF 30+ to  sun-exposed areas, reapply every 2 hours as needed.  - Staying in the shade or wearing long sleeves, sun glasses (UVA+UVB protection) and wide brim hats (4-inch brim around the entire circumference of the hat) are also recommended for sun protection.  - Call for new or changing lesions.  Personal history of CLL/immunosuppression  - Reviewed medical history for full details  - Reviewed sun protective measures as above - Encouraged full body skin exams given immunosuppression, increased risk of skin cancer   Rosacea - mild  Chronic and persistent condition with duration or expected duration over one year. Condition is symptomatic and bothersome to patient. Patient is flaring and not currently at treatment goal.   - Reviewed etiology and probable recurrent nature of this condition - Discussed potential triggers including caffeine, heat, sun exposure, chocolate, etc. and recommended patient attempt to identify and avoid triggers - Re Start doxycycline  100mg  BID x 14 days for flare (has worked for patient previously in past)  NEOPLASM OF UNCERTAIN BEHAVIOR Left Tip of Nose Skin / nail biopsy Type of biopsy: tangential   Informed consent: discussed and consent obtained   Patient was prepped and draped in usual sterile fashion: Area prepped with alcohol. Anesthesia: the lesion was anesthetized in a standard fashion   Anesthetic:  1% lidocaine  w/ epinephrine  1-100,000 buffered w/ 8.4% NaHCO3 Instrument used: flexible razor blade   Hemostasis achieved with: pressure, aluminum chloride and electrodesiccation   Outcome: patient tolerated procedure well   Post-procedure details: wound care instructions given   Post-procedure details comment:  Ointment and small bandage applied  Specimen 1 - Surgical pathology Differential Diagnosis: r/o AK  vs SKK vs ISK  Check Margins: No  Return if symptoms worsen or fail to improve.  I, Emerick Ege, CMA am acting as scribe for Lauraine JAYSON Kanaris,  MD.  Documentation: I have reviewed the above documentation for accuracy and completeness, and I agree with the above.  Lauraine JAYSON Kanaris, MD

## 2024-07-23 NOTE — Patient Instructions (Addendum)
 Wound Care Instructions  Cleanse wound gently with soap and water once a day then pat dry with clean gauze. Apply a thin coat of Petrolatum (petroleum jelly, Vaseline) over the wound (unless you have an allergy to this). We recommend that you use a new, sterile tube of Vaseline. Do not pick or remove scabs. Do not remove the yellow or white healing tissue from the base of the wound.  Cover the wound with fresh, clean, nonstick gauze and secure with paper tape. You may use Band-Aids in place of gauze and tape if the wound is small enough, but would recommend trimming much of the tape off as there is often too much. Sometimes Band-Aids can irritate the skin.  You should call the office for your biopsy report after 1 week if you have not already been contacted.  If you experience any problems, such as abnormal amounts of bleeding, swelling, significant bruising, significant pain, or evidence of infection, please call the office immediately.  FOR ADULT SURGERY PATIENTS: If you need something for pain relief you may take 1 extra strength Tylenol  (acetaminophen ) AND 2 Ibuprofen (200mg  each) together every 4 hours as needed for pain. (do not take these if you are allergic to them or if you have a reason you should not take them.) Typically, you may only need pain medication for 1 to 3 days.     Doxycycline  should be taken with food to prevent nausea. Do not lay down for 30 minutes after taking. Be cautious with sun exposure and use good sun protection while on this medication. Pregnant women should not take this medication.    Due to recent changes in healthcare laws, you may see results of your pathology and/or laboratory studies on MyChart before the doctors have had a chance to review them. We understand that in some cases there may be results that are confusing or concerning to you. Please understand that not all results are received at the same time and often the doctors may need to interpret  multiple results in order to provide you with the best plan of care or course of treatment. Therefore, we ask that you please give us  2 business days to thoroughly review all your results before contacting the office for clarification. Should we see a critical lab result, you will be contacted sooner.   If You Need Anything After Your Visit  If you have any questions or concerns for your doctor, please call our main line at [phone] and press option 4 to reach your doctor's medical assistant. If no one answers, please leave a voicemail as directed and we will return your call as soon as possible. Messages left after 4 pm will be answered the following business day.   You may also send us  a message via MyChart. We typically respond to MyChart messages within 1-2 business days.  For prescription refills, please ask your pharmacy to contact our office. Our fax number is [phone].  If you have an urgent issue when the clinic is closed that cannot wait until the next business day, you can page your doctor at the number below.    Please note that while we do our best to be available for urgent issues outside of office hours, we are not available 24/7.   If you have an urgent issue and are unable to reach us , you may choose to seek medical care at your doctor's office, retail clinic, urgent care center, or emergency room.  If you have a medical  emergency, please immediately call 911 or go to the emergency department.  Pager Numbers  - Dr. Hester: [phone]  - Dr. Jackquline: [phone]  - Dr. Claudene: [phone]   - Dr. Raymund: [phone]  In the event of inclement weather, please call our main line at [phone] for an update on the status of any delays or closures.  Dermatology Medication Tips: Please keep the boxes that topical medications come in in order to help keep track of the instructions about where and how to use these. Pharmacies typically print the medication  instructions only on the boxes and not directly on the medication tubes.   If your medication is too expensive, please contact our office at [phone] option 4 or send us  a message through MyChart.   We are unable to tell what your co-pay for medications will be in advance as this is different depending on your insurance coverage. However, we may be able to find a substitute medication at lower cost or fill out paperwork to get insurance to cover a needed medication.   If a prior authorization is required to get your medication covered by your insurance company, please allow us  1-2 business days to complete this process.  Drug prices often vary depending on where the prescription is filled and some pharmacies may offer cheaper prices.  The website www.goodrx.com contains coupons for medications through different pharmacies. The prices here do not account for what the cost may be with help from insurance (it may be cheaper with your insurance), but the website can give you the price if you did not use any insurance.  - You can print the associated coupon and take it with your prescription to the pharmacy.  - You may also stop by our office during regular business hours and pick up a GoodRx coupon card.  - If you need your prescription sent electronically to a different pharmacy, notify our office through [facility] or by phone at [phone] option 4.     Si Usted Necesita Algo Despus de Su Visita  Tambin puede enviarnos un mensaje a travs de Clinical cytogeneticist. Por lo general respondemos a los mensajes de MyChart en el transcurso de 1 a 2 das hbiles.  Para renovar recetas, por favor pida a su farmacia que se ponga en contacto con nuestra oficina. Randi lakes de fax es [place] [phone].  Si tiene un asunto urgente cuando la clnica est cerrada y que no puede esperar hasta el siguiente da hbil, puede llamar/localizar a su doctor(a) al nmero que aparece a continuacin.   Por  favor, tenga en cuenta que aunque hacemos todo lo posible para estar disponibles para asuntos urgentes fuera del horario de [place], no estamos disponibles las 24 horas del da, los 7 [address]  [address] de la [place].   Si tiene un problema urgente y no puede comunicarse con nosotros, puede optar por buscar atencin mdica  en el consultorio de su doctor(a), en una clnica privada, en un centro de atencin urgente o en una sala de emergencias.  Si tiene Engineer, drilling, por favor llame inmediatamente al 911 o vaya a la sala de emergencias.  Nmeros de bper  - Dr. Hester: [phone]  - Dra. Jackquline: [phone]  - Dr. Claudene: [phone]  - Dra. Kitts: [phone]  En caso de inclemencias del [place], por favor llame a nuestra lnea principal al [phone] para una actualizacin sobre el estado de cualquier retraso o cierre.  Consejos para la medicacin en dermatologa: Por favor, guarde las cajas en  las que vienen los medicamentos de uso tpico para ayudarle a seguir las instrucciones sobre dnde y cmo usarlos. Las farmacias generalmente imprimen las instrucciones del medicamento slo en las cajas y no directamente en los tubos del [place].   Si su medicamento es muy caro, por favor, pngase en contacto con landry rieger llamando al [phone] y presione la opcin 4 o envenos un mensaje a travs de Clinical cytogeneticist.   No podemos decirle cul ser su copago por los medicamentos por adelantado ya que esto es diferente dependiendo de la cobertura de su seguro. Sin embargo, es posible que podamos encontrar un medicamento sustituto a Audiological scientist un formulario para que el seguro cubra el medicamento que se considera necesario.   Si se requiere una autorizacin previa para que su compaa de seguros malta su medicamento, por favor permtanos de 1 a 2 das hbiles para completar este proceso.  Los precios de los medicamentos varan con frecuencia dependiendo del Environmental consultant de dnde se surte la  receta y alguna farmacias pueden ofrecer precios ms baratos.  El sitio web www.goodrx.com tiene cupones para medicamentos de Health and safety inspector. Los precios aqu no tienen en cuenta lo que podra costar con la ayuda del seguro (puede ser ms barato con su seguro), pero el sitio web puede darle el precio si no utiliz Tourist information centre manager.  - Puede imprimir el cupn correspondiente y llevarlo con su receta a la farmacia.  - Tambin puede pasar por nuestra oficina durante el horario de atencin regular y Education officer, museum una tarjeta de cupones de GoodRx.  - Si necesita que su receta se enve electrnicamente a una farmacia diferente, informe a nuestra oficina a travs de MyChart de  o por telfono llamando al [phone] y presione la opcin 4.

## 2024-07-25 LAB — SURGICAL PATHOLOGY

## 2024-07-29 ENCOUNTER — Ambulatory Visit: Payer: Self-pay

## 2024-07-29 NOTE — Telephone Encounter (Signed)
 Patient informed of pathology results

## 2024-07-29 NOTE — Telephone Encounter (Signed)
-----   Message from Lauraine JAYSON Kanaris sent at 07/29/2024  8:41 AM EDT ----- FINAL DIAGNOSIS        1. Skin, left tip of nose :       SEBORRHEIC KERATOSIS, IRRITATED   Please notify patient with below plan: Biopsy showed a benign SK.  No further treatment is needed.   ----- Message ----- From: Interface, Lab In [facility] One Sent: 07/25/2024   2:44 PM EDT To: Lauraine JAYSON Kanaris, MD

## 2024-08-13 ENCOUNTER — Inpatient Hospital Stay (HOSPITAL_BASED_OUTPATIENT_CLINIC_OR_DEPARTMENT_OTHER): Admitting: Internal Medicine

## 2024-08-13 ENCOUNTER — Inpatient Hospital Stay: Attending: Internal Medicine

## 2024-08-13 ENCOUNTER — Encounter: Payer: Self-pay | Admitting: Internal Medicine

## 2024-08-13 DIAGNOSIS — C9110 Chronic lymphocytic leukemia of B-cell type not having achieved remission: Secondary | ICD-10-CM | POA: Diagnosis not present

## 2024-08-13 DIAGNOSIS — N400 Enlarged prostate without lower urinary tract symptoms: Secondary | ICD-10-CM | POA: Diagnosis not present

## 2024-08-13 DIAGNOSIS — Z86711 Personal history of pulmonary embolism: Secondary | ICD-10-CM | POA: Insufficient documentation

## 2024-08-13 DIAGNOSIS — F17210 Nicotine dependence, cigarettes, uncomplicated: Secondary | ICD-10-CM | POA: Insufficient documentation

## 2024-08-13 DIAGNOSIS — Z79899 Other long term (current) drug therapy: Secondary | ICD-10-CM | POA: Insufficient documentation

## 2024-08-13 LAB — CBC WITH DIFFERENTIAL (CANCER CENTER ONLY)
Abs Immature Granulocytes: 0.05 K/uL (ref 0.00–0.07)
Basophils Absolute: 0.1 K/uL (ref 0.0–0.1)
Basophils Relative: 1 %
Eosinophils Absolute: 0.3 K/uL (ref 0.0–0.5)
Eosinophils Relative: 3 %
HCT: 47.8 % (ref 39.0–52.0)
Hemoglobin: 16.9 g/dL (ref 13.0–17.0)
Immature Granulocytes: 1 %
Lymphocytes Relative: 21 %
Lymphs Abs: 1.9 K/uL (ref 0.7–4.0)
MCH: 31.7 pg (ref 26.0–34.0)
MCHC: 35.4 g/dL (ref 30.0–36.0)
MCV: 89.7 fL (ref 80.0–100.0)
Monocytes Absolute: 0.7 K/uL (ref 0.1–1.0)
Monocytes Relative: 8 %
Neutro Abs: 5.9 K/uL (ref 1.7–7.7)
Neutrophils Relative %: 66 %
Platelet Count: 252 K/uL (ref 150–400)
RBC: 5.33 MIL/uL (ref 4.22–5.81)
RDW: 13.2 % (ref 11.5–15.5)
WBC Count: 9 K/uL (ref 4.0–10.5)
nRBC: 0 % (ref 0.0–0.2)

## 2024-08-13 LAB — CMP (CANCER CENTER ONLY)
ALT: 20 U/L (ref 0–44)
AST: 18 U/L (ref 15–41)
Albumin: 4.1 g/dL (ref 3.5–5.0)
Alkaline Phosphatase: 80 U/L (ref 38–126)
Anion gap: 7 (ref 5–15)
BUN: 11 mg/dL (ref 8–23)
CO2: 22 mmol/L (ref 22–32)
Calcium: 9.3 mg/dL (ref 8.9–10.3)
Chloride: 106 mmol/L (ref 98–111)
Creatinine: 0.81 mg/dL (ref 0.61–1.24)
GFR, Estimated: >60 mL/min (ref >60)
Glucose, Bld: 87 mg/dL (ref 70–99)
Potassium: 3.9 mmol/L (ref 3.5–5.1)
Sodium: 135 mmol/L (ref 135–145)
Total Bilirubin: 0.9 mg/dL (ref 0.0–1.2)
Total Protein: 6.7 g/dL (ref 6.5–8.1)

## 2024-08-13 LAB — MAGNESIUM: Magnesium: 2.1 mg/dL (ref 1.7–2.4)

## 2024-08-13 LAB — URIC ACID: Uric Acid, Serum: 5.4 mg/dL (ref 3.7–8.6)

## 2024-08-13 LAB — LACTATE DEHYDROGENASE: LDH: 117 U/L (ref 98–192)

## 2024-08-13 NOTE — Progress Notes (Signed)
Doing well.  No concerns.

## 2024-08-13 NOTE — Progress Notes (Signed)
 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   AUG 2021-Head and neck: The brain demonstrates symmetric appearing diffuse physiologic uptake without focal finding to suggest abnormality. In the left posterior and medial maxillary cavity, there is an area of mucoid deposition seen within SUV max of 28.8. This was seen in the previous study of November. The specific bony erosion, clear-cut soft tissue lesion is not detected. This probably represents a chronic sinus disease focus. The bilateral optic nerves and extraocular muscles to the largest nodes are seen on axial image 98. 1 now demonstrates a short axis measurement of 3.3 cm and the other more medially, a short axis measurement of 2.5 cm. Demonstrate symmetric increased uptake. The left, much greater than right, jugulodigastric and supraclavicular nodes have increased significantly in size and avidity since the previous study. Both of these lymph nodes demonstrate an SUV max of 3.9. No largest lymph node in the right subclavicular space is seen on axial image 104, demonstrating an SUV max of 3.3, and a short axis measurement of 1.4 cm.   On axial image 102, a right  paratracheal lymph node is again identified with increased avidity, located immediately posterior to the right thyroid pole. This area was noted previously, but now increased avidity is seen, with an SUV max of 8.6 in this area. Multiple smaller, and less avid lymph nodes scattered throughout the left greater than right neck are seen, as well as supraclavicular and infraclavicular areas.   Thorax: Significant axillary lymph nodes have increased significantly in size. These are also left greater than right, with the largest on the left seen on image #114 with a short axis measurement of 2.2 cm and an SUV max of 4.3. The largest on the right as seen on image 111, demonstrating a short axis of 1.4 cm and an SUV max of 4.0. Too numerous to count smaller and similarly avid lymph nodes in both axillae are also noted. These are all larger than previously seen and in general, more avid.  Numerous small AP window and lesser mediastinal lymph nodes are seen with minimal increased avidity, appearing similar in size and avidity to the previous study. No other significant mediastinal adenopathy. No hilar adenopathy detected. These likely also represent lymphoproliferative disease.  Hyperinflation of COPD, bibasilar dependent atelectasis, some paucity of lung tissue and biapical scarring are noted without other focal pulmonary mass or nodule, or acute pulmonary parenchymal disease. Physiologic uptake in the left heart is seen, as expected. Small hiatal hernia is again identified.   Abdomen pelvis: Physiologic uptake in the liver, gut and urinary tract is again noted. No focal area of asymmetric uptake in the structures is seen. The spleen also demonstrates generalized increased uptake and stable splenomegaly, up to 14.2 cm. On image 198, a preaortic lymph node is detected of  increased size since the previous study, now measuring up to 1.2 cm short axis.  Several large lymph nodes on image 203 of the mesentery are noted.  The largest of these, on image 201 and ureters up to 1.8 cm short axis. Despite the fact that measurable avidity is not detected in these are the surrounding lymph nodes, these are doubtless involved in the lymphoproliferative disease described, and appear larger. Multiple smaller lymph nodes scattered throughout the mesentery are also seen.  Grossly enlarged right greater than left common iliac chain lymph nodes are again seen, throughout the pelvis and into the inguinal areas bilaterally. The most avid on the right is seen on image #260 to, demonstrates a short axis of 2.5 cm, and an SUV max of 4.9. The largest on the left is identified image #272, demonstrates a short axis of 2.0 cm, and an SUV max of 4.7. These are similar to the previously noted values.  Right greater than left inguinal adenopathy is seen, with the largest lymph node seen on the right image 276, demonstrating a short axis of 1.5 cm and an SUV max of 3.9. This is larger in size and avidity than previously seen. There are bilateral enlarged lymph nodes associated with the external iliac vessels on image 277. These measure 2.0 cm on the right in short axis, with an SUV max of 3.9.  On the left, image 278, the external iliac lymph node is larger at 1.8 cm short axis, but with an SUV max of only 3.8. Lesser avid and smaller lymph nodes on both sides are also detected.   Musculoskeletal: Scattered, insignificant uptake is detected in the muscles especially in the bilateral joints, without focal finding to suggest lymphoproliferative disease.   IMPRESSIONS:  Significant increase in numbers, short axis size, and to a large extent, avidity -in the lymph nodes of the left greater than right neck, supraclavicular and infraclavicular areas, bilateral axillary lymph nodes and common iliac chain, external iliac chain and inguinal lymph nodes, consistent with worsening lymphoproliferative disease. The mediastinal lymph nodes seen are still present and  unchanged in size or [significantly] in avidity.    [place] - Bx- SLL/CLL.July 2022-Discussed with Dr. Asquith-oncologist, [place]-reviewed the pathology of the lymph node biopsy in 2021-positive for SLL; negative for cyclin D1/1114 translocation.    # July 20th, 2022- GAZYVA  #1 marcina plan ading VENATOCLAX after 2 cycles/de-bulking] s/p GAYZYVA [x6]- Feb 2023 - cycle #1  Venatoclax- x1 month- HELD sec to repeated hospitalization  # DISCONTINUED VENATOCLAX [sec to intolearnce]  # PE acute [NOV 2022] provoked--currently s/p  Eliquis  x 6 months [end of  April 2023].    # MAY 1st, 2023- START ACALABRUTINIB  100 mg BID  # December 2022-pericarditis/effusion needing pericardiocentesis; FEB 2023-acute respiratory failure-hospitalization-? Etiology [s/p ID; Pul; Dr.A; cards- [facility]]  # Lung cancer screening- on [facility]  # SURVIVORSHIP:   # GENETICS:   DIAGNOSIS:   STAGE:         ;  GOALS:  CURRENT/MOST RECENT THERAPY :       CLL (chronic lymphocytic leukemia) (HCC)  10/29/2019 Initial Diagnosis   CLL (chronic lymphocytic leukemia) (HCC)   06/15/2021 -  Chemotherapy   Patient is on Treatment Plan : LYMPHOMA CLL/SLL Venetoclax  + Obinutuzumab  q28d      INTERVAL HISTORY: Ambulating independently.  Alone.  A pleasant 73 year-old male patient with CLL/SLL currently most currently on acalabrutinib   is here for follow-up.   He has no new questions for the doctor today. Denies any  wheezing.  Denies any worsening cough.  No recent hospitalization. Otherwise denies any lumps or bumps.  No fevers.  No chills.  He continues to be compliant with his acalabrutinib .  Review of Systems  Constitutional:  Positive for malaise/fatigue. Negative for chills, diaphoresis, fever and weight loss.  HENT:  Negative for nosebleeds and sore throat.   Eyes:  Negative for double vision.  Respiratory:  Negative for cough, hemoptysis, sputum production, shortness of breath and wheezing.   Cardiovascular:  Negative for  chest pain, palpitations, orthopnea and leg swelling.  Gastrointestinal:  Negative for abdominal pain, blood in stool, constipation, diarrhea, heartburn, melena, nausea and vomiting.  Genitourinary:  Negative for dysuria, frequency and urgency.  Musculoskeletal:  Positive for joint pain and myalgias. Negative for back pain.  Skin: Negative.  Negative for itching and rash.  Neurological:  Negative for dizziness, tingling, focal weakness, weakness and headaches.  Endo/Heme/Allergies:  Does not bruise/bleed easily.  Psychiatric/Behavioral:  Negative for depression. The patient is not nervous/anxious and does not have insomnia.      PAST MEDICAL HISTORY :  Past Medical History:  Diagnosis Date   Atherosclerosis of coronary artery 09/28/2016   Chest CT Nov 2017   Atrial tachycardia (HCC)    CLL (chronic lymphocytic leukemia) (HCC)    Elevated C-reactive protein (CRP)    Elevated PSA    Fractured sternum 1999   Herpes    History of rib fracture 1999   multiple   Hyperlipidemia    Psoriasis    Pulmonary embolism (HCC)    Rheumatoid factor positive 02/2014   Rosacea    Thoracic aortic atherosclerosis (HCC) 09/28/2016   Chest CT Nov 2017   Tobacco abuse    Vitamin D  deficiency disease     PAST SURGICAL HISTORY :   Past Surgical History:  Procedure Laterality Date   APPENDECTOMY  11/27/1972   IR REMOVAL TUN ACCESS W/ PORT W/O FL MOD SED  03/20/2022   PERICARDIOCENTESIS N/A 10/03/2021   Procedure: PERICARDIOCENTESIS;  Surgeon: Dann Candyce RAMAN, MD;  Location: [facility] INVASIVE CV LAB;  Service: Cardiovascular;  Laterality: N/A;   PORTA CATH INSERTION N/A 06/06/2021   Procedure: PORTA CATH INSERTION;  Surgeon: Marea Selinda RAMAN, MD;  Location: [facility] INVASIVE CV LAB;  Service: Cardiovascular;  Laterality: N/A;   SHOULDER ARTHROSCOPY  10/23/2016   Procedure: ARTHROSCOPY SHOULDER WITH DISTAL CLAVICLE EXCISION, PARTIAL ACROMIONECTOMY, AND DEBRIDEMENT;  Surgeon: Kayla Pinal, MD;  Location: [facility]  ORS;  Service: Orthopedics;;   TIBIA FRACTURE SURGERY Left 11/27/2002   titanium rod, ORIF   TRIGGER FINGER RELEASE Right 04/27/2023   rt ring finger/[facility]    FAMILY HISTORY :   Family History  Problem Relation Age of Onset   Heart disease Father    Osteoarthritis Father    Alzheimer's disease Father    Cancer Neg Hx    Diabetes Neg Hx    Hypertension Neg Hx    Stroke Neg Hx    COPD Neg Hx     SOCIAL HISTORY:   Social History   Tobacco Use   Smoking status: Every Day    Current packs/day: 0.50    Average packs/day: 0.5 packs/day for 45.0 years (22.5 ttl pk-yrs)    Types: Cigarettes   Smokeless tobacco: Never   Tobacco comments:    11/6- currently smoking 5 cigarettes a day   Vaping Use   Vaping status: Never Used  Substance Use Topics   Alcohol use: Yes    Alcohol/week: 3.0 standard drinks  of alcohol    Types: 3 Glasses of wine per week    Comment: a couple glasses of wine a week   Drug use: No    ALLERGIES:  is allergic to gluten meal.  MEDICATIONS:  Current Outpatient Medications  Medication Sig Dispense Refill   acalabrutinib  maleate (CALQUENCE ) 100 MG tablet Take 1 tablet (100mg ) by mouth daily in the morning and 1 tablet (100mg ) in evening on Mondays, Wednesdays, and Fridays. 40 tablet 2   cholecalciferol (VITAMIN D3) 25 MCG (1000 UNIT) tablet Take 1,000 Units by mouth daily.     doxycycline  (ADOXA) 100 MG tablet Take 1 tablet (100 mg total) by mouth 2 (two) times daily. Take 100 mg twice daily for 14 days 20 tablet 0   Multiple Vitamins-Minerals (MULTIVITAMIN WITH MINERALS) tablet Take 1 tablet by mouth daily.     Roflumilast  (ZORYVE ) 0.3 % CREA Apply 1 Application topically at bedtime. Qhs to psoriasis on legs and arms 60 g 6   No current facility-administered medications for this visit.    PHYSICAL EXAMINATION: ECOG PERFORMANCE STATUS: 0 - Asymptomatic  BP 131/64 (BP Location: Left Arm, Patient Position: Sitting)   Pulse 75   Temp (!) 96.7 F (35.9  C) (Tympanic)   Ht 5' 6 (1.676 m)   Wt 161 lb (73 kg)   SpO2 99%   BMI 25.99 kg/m   Filed Weights   08/13/24 1436  Weight: 161 lb (73 kg)    No significant lymphadenopathy lymphadenopathy in the neck/underarm.  Physical Exam HENT:     Head: Normocephalic and atraumatic.     Mouth/Throat:     Pharynx: No oropharyngeal exudate.  Eyes:     Pupils: Pupils are equal, round, and reactive to light.  Cardiovascular:     Rate and Rhythm: Normal rate and regular rhythm.  Pulmonary:     Effort: No respiratory distress.     Breath sounds: No wheezing.  Abdominal:     General: Bowel sounds are normal. There is no distension.     Palpations: Abdomen is soft. There is no mass.     Tenderness: There is no abdominal tenderness. There is no guarding or rebound.  Musculoskeletal:        General: No tenderness. Normal range of motion.     Cervical back: Normal range of motion and neck supple.  Skin:    General: Skin is warm.  Neurological:     Mental Status: He is alert and oriented to person, place, and time.  Psychiatric:        Mood and Affect: Affect normal.     LABORATORY DATA:  I have reviewed the data as listed    Component Value Date/Time   NA 135 08/13/2024 1413   NA 139 10/27/2021 0857   K 3.9 08/13/2024 1413   CL 106 08/13/2024 1413   CO2 22 08/13/2024 1413   GLUCOSE 87 08/13/2024 1413   BUN 11 08/13/2024 1413   BUN 16 10/27/2021 0857   CREATININE 0.81 08/13/2024 1413   CREATININE 0.84 10/06/2019 0000   CALCIUM  9.3 08/13/2024 1413   PROT 6.7 08/13/2024 1413   ALBUMIN 4.1 08/13/2024 1413   AST 18 08/13/2024 1413   ALT 20 08/13/2024 1413   ALKPHOS 80 08/13/2024 1413   BILITOT 0.9 08/13/2024 1413   GFRNONAA >60 08/13/2024 1413   GFRNONAA 91 10/06/2019 0000   GFRAA >60 12/26/2019 0842   GFRAA 106 10/06/2019 0000    No results found for: SPEP, UPEP  Lab Results  Component Value Date   WBC 9.0 08/13/2024   NEUTROABS 5.9 08/13/2024   HGB 16.9  08/13/2024   HCT 47.8 08/13/2024   MCV 89.7 08/13/2024   PLT 252 08/13/2024      Chemistry      Component Value Date/Time   NA 135 08/13/2024 1413   NA 139 10/27/2021 0857   K 3.9 08/13/2024 1413   CL 106 08/13/2024 1413   CO2 22 08/13/2024 1413   BUN 11 08/13/2024 1413   BUN 16 10/27/2021 0857   CREATININE 0.81 08/13/2024 1413   CREATININE 0.84 10/06/2019 0000      Component Value Date/Time   CALCIUM  9.3 08/13/2024 1413   ALKPHOS 80 08/13/2024 1413   AST 18 08/13/2024 1413   ALT 20 08/13/2024 1413   BILITOT 0.9 08/13/2024 1413       RADIOGRAPHIC STUDIES: I have personally reviewed the radiological images as listed and agreed with the findings in the report. No results found.   ASSESSMENT & PLAN:   .CLL (chronic lymphocytic leukemia) (HCC) # SLL/CLL-patient currently on acalabrutinib  100 mg twice a day [since start of May 2023]- tolerating well except mild-moderate-severe fatigue/insominia [see below]. Continue acalbrutinib, but SKIP the PM dose T/Thr/Sat & Sun.  # JUNE 2nd, 2025- CT CAP- No pathologically enlarged lymph nodes within the chest, abdomen or pelvis and no splenomegaly-  stable.  Clinically, no evidence of any progression.  Labs within normal limits.  Continue surveillance. Consider imaging- next visit- given Hx of smoking.   # Myalgias/Fatigue/ Insomnia: ? Related Acalabrutinib - Continue acalbrutinib at reduced dose- s/p Exercise/CARE program-  stable.    # Psoriasis/? Arthritis-   on topical zorvye-  stable.   # Active smoker: recommend quitting smoking; cutting down-  stable.  # Enlarged prostate- PSA 7.0 [MAY 2024-]- s/p urology evaluation [Dr. Sninski/Dr. Wolf's office]- NOV 2024-PSA-5.3- improving- currently on surveillance- considering prostate MRI or biopsy if remains concerning. Recommend continued follow up Urology- stable.   # Secondary malignancies: dermatology [Dr.Stewart]- continue surveillance- stable; recommend colonoscopy- [never had one;  prior cologard sec to prep]-  stable.   # IV access: PIV  # Vaccines: ok with shingrix # [ 2023];Pneumonia shot [Aug 2025]  recommend -flu/ and Covid  # DISPOSITION: # follow up in 4 months- MD; labs- cbc/cmp; URIC ACID; LDH;mag; quantitative immunoglobulins;  Dr.B    Orders Placed This Encounter  Procedures   CBC with Differential (Cancer Center Only)    Standing Status:   Future    Expected Date:   12/10/2024    Expiration Date:   03/10/2025   CMP (Cancer Center only)    Standing Status:   Future    Expected Date:   12/10/2024    Expiration Date:   03/10/2025   Uric acid    Standing Status:   Future    Expected Date:   12/10/2024    Expiration Date:   03/10/2025   Lactate dehydrogenase    Standing Status:   Future    Expected Date:   12/10/2024    Expiration Date:   03/10/2025   Magnesium    Standing Status:   Future    Expected Date:   12/10/2024    Expiration Date:   03/10/2025   Immunoglobulins, QN, A/E/G/M    Standing Status:   Future    Expected Date:   12/10/2024    Expiration Date:   03/10/2025       Cindy JONELLE Joe, MD 08/13/2024 3:18 PM

## 2024-08-13 NOTE — Assessment & Plan Note (Addendum)
#   SLL/CLL-patient currently on acalabrutinib  100 mg twice a day [since start of May 2023]- tolerating well except mild-moderate-severe fatigue/insominia [see below]. Continue acalbrutinib, but SKIP the PM dose T/Thr/Sat & Sun.  # JUNE 2nd, 2025- CT CAP- No pathologically enlarged lymph nodes within the chest, abdomen or pelvis and no splenomegaly-  stable.  Clinically, no evidence of any progression.  Labs within normal limits.  Continue surveillance. Consider imaging- next visit- given Hx of smoking.   # Myalgias/Fatigue/ Insomnia: ? Related Acalabrutinib - Continue acalbrutinib at reduced dose- s/p Exercise/CARE program-  stable.    # Psoriasis/? Arthritis-   on topical zorvye-  stable.   # Active smoker: recommend quitting smoking; cutting down-  stable.  # Enlarged prostate- PSA 7.0 [MAY 2024-]- s/p urology evaluation [Dr. Sninski/Dr. Wolf's office]- NOV 2024-PSA-5.3- improving- currently on surveillance- considering prostate MRI or biopsy if remains concerning. Recommend continued follow up Urology- stable.   # Secondary malignancies: dermatology [Dr.Stewart]- continue surveillance- stable; recommend colonoscopy- [never had one; prior cologard sec to prep]-  stable.   # IV access: PIV  # Vaccines: ok with shingrix # [ 2023];Pneumonia shot [Aug 2025]  recommend -flu/ and Covid  # DISPOSITION: # follow up in 4 months- MD; labs- cbc/cmp; URIC ACID; LDH;mag; quantitative immunoglobulins;  Dr.B

## 2024-08-14 ENCOUNTER — Other Ambulatory Visit: Payer: Self-pay

## 2024-08-14 ENCOUNTER — Ambulatory Visit (INDEPENDENT_AMBULATORY_CARE_PROVIDER_SITE_OTHER)

## 2024-08-14 DIAGNOSIS — L814 Other melanin hyperpigmentation: Secondary | ICD-10-CM | POA: Diagnosis not present

## 2024-08-14 DIAGNOSIS — Z1283 Encounter for screening for malignant neoplasm of skin: Secondary | ICD-10-CM | POA: Diagnosis not present

## 2024-08-14 DIAGNOSIS — L409 Psoriasis, unspecified: Secondary | ICD-10-CM | POA: Diagnosis not present

## 2024-08-14 DIAGNOSIS — W908XXA Exposure to other nonionizing radiation, initial encounter: Secondary | ICD-10-CM | POA: Diagnosis not present

## 2024-08-14 DIAGNOSIS — Z856 Personal history of leukemia: Secondary | ICD-10-CM

## 2024-08-14 DIAGNOSIS — L820 Inflamed seborrheic keratosis: Secondary | ICD-10-CM

## 2024-08-14 DIAGNOSIS — L821 Other seborrheic keratosis: Secondary | ICD-10-CM

## 2024-08-14 DIAGNOSIS — L578 Other skin changes due to chronic exposure to nonionizing radiation: Secondary | ICD-10-CM | POA: Diagnosis not present

## 2024-08-14 DIAGNOSIS — D1801 Hemangioma of skin and subcutaneous tissue: Secondary | ICD-10-CM | POA: Diagnosis not present

## 2024-08-14 DIAGNOSIS — L719 Rosacea, unspecified: Secondary | ICD-10-CM

## 2024-08-14 DIAGNOSIS — D229 Melanocytic nevi, unspecified: Secondary | ICD-10-CM

## 2024-08-14 MED ORDER — DOXYCYCLINE MONOHYDRATE 100 MG PO TABS: 100.0000 mg | ORAL_TABLET | Freq: Two times a day (BID) | ORAL | 2 refills | Status: AC

## 2024-08-14 NOTE — Progress Notes (Signed)
 Subjective   Andres Mullins is a 73 y.o. male who presents for the following: Total body skin exam for skin cancer screening and mole check. The patient has spots, moles and lesions to be evaluated, some may be new or changing and the patient may have concern these could be cancer.. Patient is established patient   Today patient reports: Several irritated spots, no hx of skin cancer. A spot at left preauricular that does not seem to heal up, present for at least 6 months. Patient with CLL, rosacea. Takes doxycycline  prn flares for rosacea - works well. Had focused exam at [facility], dx w/ iSK on bx- healing well.    Review of Systems:    No other skin or systemic complaints except as noted in HPI or Assessment and Plan.  The following portions of the chart were reviewed this encounter and updated as appropriate: medications, allergies, medical history  Relevant Medical History:  Immunosuppression   Objective  Well appearing patient in no apparent distress; mood and affect are within normal limits. Examination was performed of the: Full Skin Examination: scalp, head, eyes, ears, nose, lips, neck, chest, axillae, abdomen, back, buttocks, bilateral upper extremities, bilateral lower extremities, hands, feet, fingers, toes, fingernails, and toenails.   Examination notable for: Angioma(s): Scattered red vascular papule(s)  , Lentigo/lentigines: Scattered pigmented macules that are tan to brown in color and are somewhat non-uniform in shape and concentrated in the sun-exposed areas, Nevus/nevi: Scattered well-demarcated, regular, pigmented macule(s) and/or papule(s)  , Seborrheic Keratosis(es): Stuck-on appearing keratotic papule(s) on the trunk, some  irritated with redness, crusting, edema, and/or partial avulsion, Actinic Damage/Elastosis: chronic sun damage: dyspigmentation, telangiectasia, and wrinkling, Actinic Purpura: Diffuse atrophy with scattered purpuric patches 1-3 cm in size on the bilateral  forearms and dorsal hands. There is evidence of post-inflammatory hyperpigmentation with an orange-brown color change, Psoriasis: Well circumscribed erythematous papules and plaques with overlying silvery scale on flexural surfaces including elbows, knees, gluteal cleft , Rosacea: Mild, diffuse erythema and telangiectasias involving the central face. Some papules and pustules.   Examination limited by: Undergarments, Shoes or socks , and Patient deferred removal     L preauriuclar x 1, L flank x 1, back x 1 (3) Erythematous stuck-on, waxy papule or plaque  Assessment & Plan   SKIN CANCER SCREENING PERFORMED TODAY.  BENIGN SKIN FINDINGS  - Lentigines  - Seborrheic keratoses  - Hemangiomas   - Nevus/Multiple Benign Nevi - Reassurance provided regarding the benign appearance of lesions noted on exam today; no treatment is indicated in the absence of symptoms/changes. - Reinforced importance of photoprotective strategies including liberal and frequent sunscreen use of a broad-spectrum SPF 30 or greater, use of protective clothing, and sun avoidance for prevention of cutaneous malignancy and photoaging.  Counseled patient on the importance of regular self-skin monitoring as well as routine clinical skin examinations as scheduled.   Rosacea - mild  Chronic and persistent condition with duration or expected duration over one year. Condition is symptomatic and bothersome to patient. Patient is flaring and not currently at treatment goal.   - Reviewed etiology and probable recurrent nature of this condition - Discussed potential triggers including caffeine, heat, sun exposure, chocolate, etc. and recommended patient attempt to identify and avoid triggers - continue doxycycline  100mg  BID x 14 days for flares (has worked for patient previously in past) - refilled  - continue metronidazole  0.75% cr twice daily  - denied refills   Psoriasis - mild, BSA% 1 Chronic and  persistent condition with duration  or expected duration over one year. Condition is symptomatic and bothersome to patient. Patient is flaring and not currently at treatment goal.  - Educated, discussed chronic nature and waxing/waning. - Discussed association with psoriatic arthritis, monitor for increasing joint pain/stiffness, red/hot swollen fingers or joints; discussed if develops joint involvement will need referral to Rheumatology and possible escalation of therapy - continue Zoryve  prn to aa - denied refills   ACTINIC DAMAGE - Chronic condition, secondary to cumulative UV/sun exposure - Recommend daily broad spectrum sunscreen SPF 30+ to sun-exposed areas, reapply every 2 hours as needed.  - Staying in the shade or wearing long sleeves, sun glasses (UVA+UVB protection) and wide brim hats (4-inch brim around the entire circumference of the hat) are also recommended for sun protection.  - Call for new or changing lesions.  Personal history of CLL/immunosuppression - Reviewed medical history for full details  - Reviewed sun protective measures as above - Encouraged full body skin exams     Procedures, orders, diagnosis for this visit:  INFLAMED SEBORRHEIC KERATOSIS (3) L preauriuclar x 1, L flank x 1, back x 1 (3) Symptomatic, irritating, patient would like treated.  Benign-appearing.  Call clinic for new or changing lesions.   Destruction of lesion - L preauriuclar x 1, L flank x 1, back x 1 (3) Complexity: simple   Destruction method: cryotherapy   Informed consent: discussed and consent obtained   Timeout:  patient name, date of birth, surgical site, and procedure verified Lesion destroyed using liquid nitrogen: Yes   Region frozen until ice ball extended beyond lesion: Yes   Cryo cycles: 1 or 2. Outcome: patient tolerated procedure well with no complications   Post-procedure details: wound care instructions given    SKIN EXAM FOR MALIGNANT NEOPLASM   LENTIGO   SEBORRHEIC KERATOSIS   CHERRY  ANGIOMA   MULTIPLE BENIGN NEVI   ACTINIC SKIN DAMAGE   ROSACEA   PSORIASIS    Inflamed seborrheic keratosis -     Destruction of lesion  Skin exam for malignant neoplasm  Lentigo  Seborrheic keratosis  Cherry angioma  Multiple benign nevi  Actinic skin damage  Rosacea  Psoriasis  Other orders -     Doxycycline  Monohydrate; Take 1 tablet (100 mg total) by mouth 2 (two) times daily. Take 100 mg twice daily for 14 days  Dispense: 20 tablet; Refill: 2    Return to clinic: Return in about 1 year (around 08/14/2025) for TBSE, Rosacea, Psoriasis, with Dr. Raymund or , with Dr. Jackquline.  LILLETTE Lonell Drones, RMA, am acting as scribe for Lauraine JAYSON Raymund, MD .   Documentation: I have reviewed the above documentation for accuracy and completeness, and I agree with the above.  Lauraine JAYSON Raymund, MD

## 2024-08-14 NOTE — Patient Instructions (Signed)

## 2024-08-15 ENCOUNTER — Other Ambulatory Visit: Payer: Self-pay

## 2024-08-15 LAB — IMMUNOGLOBULINS A/E/G/M, SERUM
IgA: 84 mg/dL (ref 61–437)
IgE (Immunoglobulin E), Serum: 9 [IU]/mL (ref 6–495)
IgG (Immunoglobin G), Serum: 587 mg/dL — ABNORMAL LOW (ref 603–1613)
IgM (Immunoglobulin M), Srm: 15 mg/dL (ref 15–143)

## 2024-08-18 ENCOUNTER — Other Ambulatory Visit: Payer: Self-pay

## 2024-08-18 ENCOUNTER — Encounter (INDEPENDENT_AMBULATORY_CARE_PROVIDER_SITE_OTHER): Payer: Self-pay

## 2024-08-18 NOTE — Progress Notes (Signed)
 Specialty Pharmacy Refill Coordination Note  Andres Mullins is a 73 y.o. male contacted today regarding refills of specialty medication(s) Acalabrutinib  Maleate (Calquence )   Patient requested (Patient-Rptd) Delivery   Delivery date: 08/25/24   Verified address: (Patient-Rptd) [address]     [place], [place] [address]   Medication will be filled on 08/22/24.

## 2024-08-21 ENCOUNTER — Other Ambulatory Visit: Payer: Self-pay

## 2024-08-28 ENCOUNTER — Other Ambulatory Visit: Payer: Self-pay

## 2024-08-28 DIAGNOSIS — R972 Elevated prostate specific antigen [PSA]: Secondary | ICD-10-CM | POA: Diagnosis not present

## 2024-08-30 LAB — REFLEX INFORMATION

## 2024-08-30 LAB — PROSTATE HEALTH INDEX: Prostate Specific Ag: 3.8 ng/mL (ref 0.0–3.9)

## 2024-09-02 ENCOUNTER — Encounter: Payer: Self-pay | Admitting: Urology

## 2024-09-02 ENCOUNTER — Ambulatory Visit (INDEPENDENT_AMBULATORY_CARE_PROVIDER_SITE_OTHER): Admitting: Urology

## 2024-09-02 VITALS — BP 129/81 | HR 88 | Ht 66.0 in | Wt 160.0 lb

## 2024-09-02 DIAGNOSIS — R972 Elevated prostate specific antigen [PSA]: Secondary | ICD-10-CM

## 2024-09-02 NOTE — Patient Instructions (Signed)
 Understanding PSA Screening  What is PSA Screening? PSA stands for Prostate-Specific Antigen. It is a protein made by the prostate gland.  PSA is made by normal prostate tissue, but can be elevated in patients with prostate cancer.  There are other factors that can cause an increased PSA besides prostate cancer including an enlarged prostate(BPH), recent ejaculation, infection(prostatitis), inflammation, recent illness or procedure.  A normal PSA level is less than 4 for men under age 27.  Why is PSA Screening Important? Prostate cancer is a common cancer in men, by finding prostate cancer early before patients have symptoms we can often cure this with either surgery or radiation.  Some prostate cancers grow very slowly and do not need any intervention and can be safely monitored(active surveillance).  Our goal is to find those more aggressive prostate cancers that if untreated would spread outside the prostate and cause symptoms or death.  If prostate cancer spreads outside the prostate, it cannot usually be cured, but multiple treatments are available to slow the growth of cancer and prolong life.  Who Should Get Tested? Most patients should start PSA testing around age 46 or 30, however high risk patients with a family history of prostate cancer, African American descent, patients with a strong family history of breast cancer should consider screening earlier starting at age 66. After age 5, the risks of screening start outweigh the benefits, and routine screening is not recommended after age 5.  This is because even if you develop prostate cancer in your 4s, 66s, or 90s it tends to grow so slowly that it would not cause symptoms or problems.  What Are the Benefits?    Early detection of prostate cancer More treatment options if cancer is found  Options if you have an elevated PSA: First, a confirmatory second PSA level will always be checked to confirm elevation, as false elevations are  common and we want to avoid any invasive testing if possible If you have 2 elevated PSA levels, options include:  PHI(prostate health index) score: (fancy PSA blood test), this can help determine your risk of prostate cancer if your PSA is mildly elevated between 4 and 10 before moving to more expensive/invasive testing Prostate MRI: Imaging test that can detect areas suspicious for prostate cancer, if MRI is completely normal can potentially avoid a prostate biopsy Prostate biopsy: 10 to 15-minute procedure performed in clinic, can be uncomfortable, 1 to 2% chance of serious bleeding or infection, best test to determine if prostate cancer is present but more invasive   What Are the Risks or Downsides?    Prostate biopsy can be uncomfortable with a small risk of bleeding or infection Some prostate cancers grow very slowly and might never cause problems, and detection may lead to unnecessary treatments Possible side effects from follow-up tests or treatments

## 2024-09-02 NOTE — Progress Notes (Signed)
   09/02/2024 3:44 PM   Andres Mullins Dec 23, 1950 [id number]  Reason for visit: Follow up elevated PSA  History: History of elevated PSA, negative prostate biopsy ~2015 Family history of prostate cancer in father treated at age 71 with surgery 50 g prostate on CT from June 2024 PSA has been variable  Physical Exam: BP 129/81 (BP Location: Left Arm, Patient Position: Sitting, Cuff Size: Normal)   Pulse 88   Ht 5' 6 (1.676 m)   Wt 160 lb (72.6 kg)   SpO2 98%   BMI 25.82 kg/m    Imaging/labs: Most recent PSA from October 2025 normal at 3.8, decreased from 5.05 August 2023, 7.03 March 2024, prior PHI score was 32.6 indicating approximately 17% risk of prostate cancer  Today: Denies any urinary symptoms, he is very reassured about normal PSA  Plan:   History of elevated PSA: PSA currently normal.  He would like to continue yearly monitoring with PCP.  If PSA persistently rising or greater than 10 would recommend prostate MRI   Andres JAYSON Burnet, MD  [facility] [address], [address] [place], [place] [address] [phone]

## 2024-09-03 ENCOUNTER — Ambulatory Visit: Admitting: Urology

## 2024-09-16 ENCOUNTER — Other Ambulatory Visit: Payer: Self-pay

## 2024-09-17 ENCOUNTER — Other Ambulatory Visit: Payer: Self-pay

## 2024-09-17 ENCOUNTER — Encounter (INDEPENDENT_AMBULATORY_CARE_PROVIDER_SITE_OTHER): Payer: Self-pay

## 2024-09-17 NOTE — Progress Notes (Signed)
 Specialty Pharmacy Refill Coordination Note  Andres Mullins is a 73 y.o. male contacted today regarding refills of specialty medication(s) Acalabrutinib  Maleate (Calquence )   Patient requested (Patient-Rptd) Delivery   Delivery date: 09/19/24   Verified address: (Patient-Rptd) [address]   [place], [place] [address]   Medication will be filled on 09/18/24.

## 2024-10-08 ENCOUNTER — Other Ambulatory Visit: Payer: Self-pay | Admitting: Internal Medicine

## 2024-10-08 ENCOUNTER — Other Ambulatory Visit: Payer: Self-pay

## 2024-10-08 ENCOUNTER — Other Ambulatory Visit (HOSPITAL_COMMUNITY): Payer: Self-pay

## 2024-10-08 DIAGNOSIS — C9110 Chronic lymphocytic leukemia of B-cell type not having achieved remission: Secondary | ICD-10-CM

## 2024-10-08 MED ORDER — CALQUENCE 100 MG PO TABS
ORAL_TABLET | ORAL | 2 refills | Status: DC
  Filled 2024-10-09: qty 40, 28d supply, fill #0
  Filled 2024-10-09: qty 40, fill #0
  Filled 2024-11-11: qty 40, 28d supply, fill #1
  Filled 2024-12-04 (×2): qty 40, 28d supply, fill #2

## 2024-10-09 ENCOUNTER — Other Ambulatory Visit: Payer: Self-pay

## 2024-10-13 ENCOUNTER — Other Ambulatory Visit (HOSPITAL_COMMUNITY): Payer: Self-pay

## 2024-10-13 ENCOUNTER — Encounter (INDEPENDENT_AMBULATORY_CARE_PROVIDER_SITE_OTHER): Payer: Self-pay

## 2024-10-13 ENCOUNTER — Other Ambulatory Visit: Payer: Self-pay

## 2024-10-13 NOTE — Progress Notes (Signed)
 Specialty Pharmacy Refill Coordination Note  Andres Mullins is a 73 y.o. male contacted today regarding refills of specialty medication(s) Acalabrutinib  Maleate (Calquence )   Patient requested (Patient-Rptd) Delivery   Delivery date: 10/16/24   Verified address: (Patient-Rptd) [address]        [place], [place] [address]   Medication will be filled on: 10/15/24

## 2024-10-14 ENCOUNTER — Other Ambulatory Visit: Payer: Self-pay

## 2024-10-17 ENCOUNTER — Other Ambulatory Visit: Payer: Self-pay

## 2024-10-17 NOTE — Progress Notes (Signed)
 Specialty Pharmacy Ongoing Clinical Assessment Note  Andres Mullins is a 73 y.o. male who is being followed by the specialty pharmacy service for RxSp Oncology   Patient's specialty medication(s) reviewed today: Acalabrutinib  Maleate (Calquence )   Missed doses in the last 4 weeks: 0   Patient/Caregiver asked additional questions regarding hazardous bag for medications. Talked with [facility] outpatient and they will provide hazardous bag for traveling.  Therapeutic benefit summary: Patient is achieving benefit   Adverse events/side effects summary: No adverse events/side effects   Patient's therapy is appropriate to: Continue    Goals Addressed             This Visit's Progress    Slow Disease Progression   On track    Patient is on track. Patient will maintain adherence. Stable disease with no evidence of progression per office visit on 08/13/24.         Follow up: 6 months  [facility]

## 2024-11-11 ENCOUNTER — Other Ambulatory Visit: Payer: Self-pay

## 2024-11-12 ENCOUNTER — Other Ambulatory Visit: Payer: Self-pay

## 2024-11-12 NOTE — Progress Notes (Signed)
 Specialty Pharmacy Refill Coordination Note  Andres Mullins is a 73 y.o. male contacted today regarding refills of specialty medication(s) Acalabrutinib  Maleate (Calquence )   Patient requested (Patient-Rptd) Delivery   Delivery date: 11/14/24   Verified address: (Patient-Rptd) [address]     [place], [place] [address]   Medication will be filled on: 11/13/24

## 2024-12-04 ENCOUNTER — Encounter: Payer: Self-pay | Admitting: Internal Medicine

## 2024-12-04 ENCOUNTER — Other Ambulatory Visit (HOSPITAL_COMMUNITY): Payer: Self-pay

## 2024-12-04 NOTE — Progress Notes (Signed)
 Specialty Pharmacy Refill Coordination Note  Andres Mullins is a 74 y.o. male contacted today regarding refills of specialty medication(s) Acalabrutinib  Maleate (Calquence )   Patient requested Delivery   Delivery date: 12/12/24   Verified address: [address]     [place], [place] [address]   Medication will be filled on: 12/11/24

## 2024-12-10 ENCOUNTER — Encounter: Payer: Self-pay | Admitting: Internal Medicine

## 2024-12-11 ENCOUNTER — Other Ambulatory Visit: Payer: Self-pay

## 2024-12-17 ENCOUNTER — Encounter: Payer: Self-pay | Admitting: Internal Medicine

## 2024-12-17 ENCOUNTER — Inpatient Hospital Stay: Attending: Internal Medicine

## 2024-12-17 ENCOUNTER — Inpatient Hospital Stay: Admitting: Internal Medicine

## 2024-12-17 VITALS — BP 117/69 | HR 88 | Temp 98.0°F | Resp 18 | Ht 66.0 in | Wt 158.7 lb

## 2024-12-17 DIAGNOSIS — J449 Chronic obstructive pulmonary disease, unspecified: Secondary | ICD-10-CM | POA: Insufficient documentation

## 2024-12-17 DIAGNOSIS — Z86711 Personal history of pulmonary embolism: Secondary | ICD-10-CM | POA: Insufficient documentation

## 2024-12-17 DIAGNOSIS — E785 Hyperlipidemia, unspecified: Secondary | ICD-10-CM | POA: Diagnosis not present

## 2024-12-17 DIAGNOSIS — M1990 Unspecified osteoarthritis, unspecified site: Secondary | ICD-10-CM | POA: Insufficient documentation

## 2024-12-17 DIAGNOSIS — Z8261 Family history of arthritis: Secondary | ICD-10-CM | POA: Diagnosis not present

## 2024-12-17 DIAGNOSIS — C9110 Chronic lymphocytic leukemia of B-cell type not having achieved remission: Secondary | ICD-10-CM | POA: Insufficient documentation

## 2024-12-17 DIAGNOSIS — Z8249 Family history of ischemic heart disease and other diseases of the circulatory system: Secondary | ICD-10-CM | POA: Diagnosis not present

## 2024-12-17 DIAGNOSIS — N400 Enlarged prostate without lower urinary tract symptoms: Secondary | ICD-10-CM | POA: Diagnosis not present

## 2024-12-17 DIAGNOSIS — F17210 Nicotine dependence, cigarettes, uncomplicated: Secondary | ICD-10-CM | POA: Insufficient documentation

## 2024-12-17 DIAGNOSIS — Z820 Family history of epilepsy and other diseases of the nervous system: Secondary | ICD-10-CM | POA: Diagnosis not present

## 2024-12-17 DIAGNOSIS — M791 Myalgia, unspecified site: Secondary | ICD-10-CM | POA: Insufficient documentation

## 2024-12-17 DIAGNOSIS — Z9049 Acquired absence of other specified parts of digestive tract: Secondary | ICD-10-CM | POA: Diagnosis not present

## 2024-12-17 DIAGNOSIS — Z79899 Other long term (current) drug therapy: Secondary | ICD-10-CM | POA: Insufficient documentation

## 2024-12-17 DIAGNOSIS — M2550 Pain in unspecified joint: Secondary | ICD-10-CM | POA: Diagnosis not present

## 2024-12-17 LAB — CMP (CANCER CENTER ONLY)
ALT: 23 U/L (ref 0–44)
AST: 22 U/L (ref 15–41)
Albumin: 4.5 g/dL (ref 3.5–5.0)
Alkaline Phosphatase: 93 U/L (ref 38–126)
Anion gap: 11 (ref 5–15)
BUN: 8 mg/dL (ref 8–23)
CO2: 26 mmol/L (ref 22–32)
Calcium: 10.3 mg/dL (ref 8.9–10.3)
Chloride: 105 mmol/L (ref 98–111)
Creatinine: 0.84 mg/dL (ref 0.61–1.24)
GFR, Estimated: >60 mL/min
Glucose, Bld: 70 mg/dL (ref 70–99)
Potassium: 3.9 mmol/L (ref 3.5–5.1)
Sodium: 141 mmol/L (ref 135–145)
Total Bilirubin: 0.5 mg/dL (ref 0.0–1.2)
Total Protein: 6.8 g/dL (ref 6.5–8.1)

## 2024-12-17 LAB — CBC WITH DIFFERENTIAL (CANCER CENTER ONLY)
Abs Immature Granulocytes: 0.05 K/uL (ref 0.00–0.07)
Basophils Absolute: 0.1 K/uL (ref 0.0–0.1)
Basophils Relative: 1 %
Eosinophils Absolute: 0.2 K/uL (ref 0.0–0.5)
Eosinophils Relative: 2 %
HCT: 49.5 % (ref 39.0–52.0)
Hemoglobin: 17.1 g/dL — ABNORMAL HIGH (ref 13.0–17.0)
Immature Granulocytes: 1 %
Lymphocytes Relative: 18 %
Lymphs Abs: 1.9 K/uL (ref 0.7–4.0)
MCH: 31.7 pg (ref 26.0–34.0)
MCHC: 34.5 g/dL (ref 30.0–36.0)
MCV: 91.8 fL (ref 80.0–100.0)
Monocytes Absolute: 0.8 K/uL (ref 0.1–1.0)
Monocytes Relative: 8 %
Neutro Abs: 7.4 K/uL (ref 1.7–7.7)
Neutrophils Relative %: 70 %
Platelet Count: 274 K/uL (ref 150–400)
RBC: 5.39 MIL/uL (ref 4.22–5.81)
RDW: 13.5 % (ref 11.5–15.5)
WBC Count: 10.5 K/uL (ref 4.0–10.5)
nRBC: 0 % (ref 0.0–0.2)

## 2024-12-17 LAB — URIC ACID: Uric Acid, Serum: 5.3 mg/dL (ref 3.7–8.6)

## 2024-12-17 LAB — LACTATE DEHYDROGENASE: LDH: 156 U/L (ref 105–235)

## 2024-12-17 LAB — MAGNESIUM: Magnesium: 2 mg/dL (ref 1.7–2.4)

## 2024-12-17 NOTE — Assessment & Plan Note (Addendum)
#   SLL/CLL-patient currently on acalabrutinib  100 mg twice a day [since start of May 2023]- tolerating well except mild-moderate-severe fatigue/insominia [see below]. Continue acalbrutinib, but SKIP the PM dose T/Thr/Sat & Sun.  # JUNE 2nd, 2025- CT CAP- No pathologically enlarged lymph nodes within the chest, abdomen or pelvis and no splenomegaly-  stable. Clinically, no evidence of any progression.  Labs within normal limits.  Continue surveillance will order CT CAP- given Hx of smoking.   # Psoriasis/? Arthritis-   on topical zorvye-  stable.   # Active smoker: recommend quitting smoking; cutting down-  stable.  # Enlarged prostate- PSA 7.0 [MAY 2024-]- s/p urology evaluation [Dr. Sninski/Dr. Wolf's office]- NOV 2024-PSA-5.3- improving- currently on surveillance- considering prostate MRI or biopsy if remains concerning. Recommend continued follow up Urology- stable.   # Secondary malignancies: dermatology [Dr.Stewart]- continue surveillance- stable; recommend colonoscopy- [never had one; prior cologard sec to prep]-finally agreed to colonoscopy today.   referral to [facility]-Dr.Anna- re: screening colonoscopy  # IV access: PIV  # Vaccines: ok with shingrix # [ 2023];Pneumonia shot [Aug 2025]  recommend -flu/ and Covid  # DISPOSITION: # referral to [facility]-Dr.Anna- re: screening colonoscopy # follow up in 4 months- MD; labs- cbc/cmp; URIC ACID; LDH;mag; quantitative immunoglobulins; CT CAP;  Dr.B

## 2024-12-17 NOTE — Progress Notes (Signed)
 [facility] OFFICE PROGRESS NOTE  Patient Care Team: Bernardo Fend, DO as PCP - General (Internal Medicine) End, Lonni, MD as PCP - Cardiology (Cardiology) Lada, Newell SQUIBB, MD as Attending Physician (Family Medicine) Jackquline Sawyer, MD (Dermatology) Francisca Redell BROCKS, MD as Consulting Physician (Urology) Rennie Cindy SAUNDERS, MD as Consulting Physician (Hematology and Oncology) Rennie Cindy SAUNDERS, MD as Consulting Physician (Oncology)   SUMMARY OF ONCOLOGIC HISTORY:  Oncology History Overview Note  # 2016- CLL-[flow] CD5 (+)/CD23 (+) clonal B-cell population, CLL/SLL phenotype, 18% of leukocytes, <5,000/uL, CD38 positive   # NOV 2020 PET [incidental]- Generalized Lymphadenopathy- left supraclav/ bil ax LN L> R; mesenteric LN; pelvic LN; FEB 5th 2021- 2.5cm left supraclav; Left ax LN- 2.4cm.   AUG 2021-Head and neck: The brain demonstrates symmetric appearing diffuse physiologic uptake without focal finding to suggest abnormality. In the left posterior and medial maxillary cavity, there is an area of mucoid deposition seen within SUV max of 28.8. This was seen in the previous study of November. The specific bony erosion, clear-cut soft tissue lesion is not detected. This probably represents a chronic sinus disease focus. The bilateral optic nerves and extraocular muscles to the largest nodes are seen on axial image 98. 1 now demonstrates a short axis measurement of 3.3 cm and the other more medially, a short axis measurement of 2.5 cm. Demonstrate symmetric increased uptake. The left, much greater than right, jugulodigastric and supraclavicular nodes have increased significantly in size and avidity since the previous study. Both of these lymph nodes demonstrate an SUV max of 3.9. No largest lymph node in the right subclavicular space is seen on axial image 104, demonstrating an SUV max of 3.3, and a short axis measurement of 1.4 cm.   On axial image 102, a right  paratracheal lymph node is again identified with increased avidity, located immediately posterior to the right thyroid pole. This area was noted previously, but now increased avidity is seen, with an SUV max of 8.6 in this area. Multiple smaller, and less avid lymph nodes scattered throughout the left greater than right neck are seen, as well as supraclavicular and infraclavicular areas.   Thorax: Significant axillary lymph nodes have increased significantly in size. These are also left greater than right, with the largest on the left seen on image #114 with a short axis measurement of 2.2 cm and an SUV max of 4.3. The largest on the right as seen on image 111, demonstrating a short axis of 1.4 cm and an SUV max of 4.0. Too numerous to count smaller and similarly avid lymph nodes in both axillae are also noted. These are all larger than previously seen and in general, more avid.  Numerous small AP window and lesser mediastinal lymph nodes are seen with minimal increased avidity, appearing similar in size and avidity to the previous study. No other significant mediastinal adenopathy. No hilar adenopathy detected. These likely also represent lymphoproliferative disease.  Hyperinflation of COPD, bibasilar dependent atelectasis, some paucity of lung tissue and biapical scarring are noted without other focal pulmonary mass or nodule, or acute pulmonary parenchymal disease. Physiologic uptake in the left heart is seen, as expected. Small hiatal hernia is again identified.   Abdomen pelvis: Physiologic uptake in the liver, gut and urinary tract is again noted. No focal area of asymmetric uptake in the structures is seen. The spleen also demonstrates generalized increased uptake and stable splenomegaly, up to 14.2 cm. On image 198, a preaortic lymph node is detected of  increased size since the previous study, now measuring up to 1.2 cm short axis.  Several large lymph nodes on image 203 of the mesentery are noted.  The largest of these, on image 201 and ureters up to 1.8 cm short axis. Despite the fact that measurable avidity is not detected in these are the surrounding lymph nodes, these are doubtless involved in the lymphoproliferative disease described, and appear larger. Multiple smaller lymph nodes scattered throughout the mesentery are also seen.  Grossly enlarged right greater than left common iliac chain lymph nodes are again seen, throughout the pelvis and into the inguinal areas bilaterally. The most avid on the right is seen on image #260 to, demonstrates a short axis of 2.5 cm, and an SUV max of 4.9. The largest on the left is identified image #272, demonstrates a short axis of 2.0 cm, and an SUV max of 4.7. These are similar to the previously noted values.  Right greater than left inguinal adenopathy is seen, with the largest lymph node seen on the right image 276, demonstrating a short axis of 1.5 cm and an SUV max of 3.9. This is larger in size and avidity than previously seen. There are bilateral enlarged lymph nodes associated with the external iliac vessels on image 277. These measure 2.0 cm on the right in short axis, with an SUV max of 3.9.  On the left, image 278, the external iliac lymph node is larger at 1.8 cm short axis, but with an SUV max of only 3.8. Lesser avid and smaller lymph nodes on both sides are also detected.   Musculoskeletal: Scattered, insignificant uptake is detected in the muscles especially in the bilateral joints, without focal finding to suggest lymphoproliferative disease.   IMPRESSIONS:  Significant increase in numbers, short axis size, and to a large extent, avidity -in the lymph nodes of the left greater than right neck, supraclavicular and infraclavicular areas, bilateral axillary lymph nodes and common iliac chain, external iliac chain and inguinal lymph nodes, consistent with worsening lymphoproliferative disease. The mediastinal lymph nodes seen are still present and  unchanged in size or [significantly] in avidity.    [place] - Bx- SLL/CLL.July 2022-Discussed with Dr. Asquith-oncologist, [place]-reviewed the pathology of the lymph node biopsy in 2021-positive for SLL; negative for cyclin D1/1114 translocation.    # July 20th, 2022- GAZYVA  #1 marcina plan ading VENATOCLAX after 2 cycles/de-bulking] s/p GAYZYVA [x6]- Feb 2023 - cycle #1  Venatoclax- x1 month- HELD sec to repeated hospitalization  # DISCONTINUED VENATOCLAX [sec to intolearnce]  # PE acute [NOV 2022] provoked--currently s/p  Eliquis  x 6 months [end of  April 2023].    # MAY 1st, 2023- START ACALABRUTINIB  100 mg BID  # December 2022-pericarditis/effusion needing pericardiocentesis; FEB 2023-acute respiratory failure-hospitalization-? Etiology [s/p ID; Pul; Dr.A; cards- [facility]]  # Lung cancer screening- on [facility]  # SURVIVORSHIP:   # GENETICS:   DIAGNOSIS:   STAGE:         ;  GOALS:  CURRENT/MOST RECENT THERAPY :       CLL (chronic lymphocytic leukemia) (HCC)  10/29/2019 Initial Diagnosis   CLL (chronic lymphocytic leukemia) (HCC)   06/15/2021 -  Chemotherapy   Patient is on Treatment Plan : LYMPHOMA CLL/SLL Venetoclax  + Obinutuzumab  q28d      INTERVAL HISTORY: Ambulating independently.  Alone.  A pleasant 74 year-old male patient with CLL/SLL currently most currently on acalabrutinib   is here for follow-up.  Discussed the use of AI scribe software for clinical note transcription with  the patient, who gave verbal consent to proceed.  History of Present Illness   Andres Mullins is a 74 year old male with chronic lymphocytic leukemia (CLL) who presents for routine hematology-oncology follow-up to monitor disease status and assess for secondary malignancies.  He remains clinically stable on acalabrutinib  with no changes in dose or frequency. He is asymptomatic, denying new symptoms, lymphadenopathy, B symptoms, or constitutional complaints. He notes that a previously present  palpable mass has resolved and has been absent for some time.  Laboratory studies, including renal and hepatic function, calcium , and LDH, are within normal limits. Complete blood count was pending at the time of visit but subsequently showed a normal white count and mildly elevated hemoglobin. His last CT scan was performed in early June of the previous year, with plans for annual imaging.  He continues to smoke with no change in tobacco use. He previously participated in annual lung cancer screening, which was discontinued after the program coordinator retired. He denies new pulmonary symptoms and does not require supplemental oxygen.  He has not undergone colonoscopy but completed a Cologuard test several years ago, which was negative. He denies family history of colon cancer and notes his sister's colonoscopies have been negative.      Review of Systems  Constitutional:  Positive for malaise/fatigue. Negative for chills, diaphoresis, fever and weight loss.  HENT:  Negative for nosebleeds and sore throat.   Eyes:  Negative for double vision.  Respiratory:  Negative for cough, hemoptysis, sputum production, shortness of breath and wheezing.   Cardiovascular:  Negative for chest pain, palpitations, orthopnea and leg swelling.  Gastrointestinal:  Negative for abdominal pain, blood in stool, constipation, diarrhea, heartburn, melena, nausea and vomiting.  Genitourinary:  Negative for dysuria, frequency and urgency.  Musculoskeletal:  Positive for joint pain and myalgias. Negative for back pain.  Skin: Negative.  Negative for itching and rash.  Neurological:  Negative for dizziness, tingling, focal weakness, weakness and headaches.  Endo/Heme/Allergies:  Does not bruise/bleed easily.  Psychiatric/Behavioral:  Negative for depression. The patient is not nervous/anxious and does not have insomnia.      PAST MEDICAL HISTORY :  Past Medical History:  Diagnosis Date   Allergy 2012   gluten    Atherosclerosis of coronary artery 09/28/2016   Chest CT Nov 2017   Atrial tachycardia    CLL (chronic lymphocytic leukemia) (HCC)    Elevated C-reactive protein (CRP)    Elevated PSA    Fractured sternum 1999   Herpes    History of rib fracture 1999   multiple   Hyperlipidemia    Psoriasis    Pulmonary embolism (HCC)    Rheumatoid factor positive 02/2014   Rosacea    Thoracic aortic atherosclerosis 09/28/2016   Chest CT Nov 2017   Tobacco abuse    Vitamin D  deficiency disease     PAST SURGICAL HISTORY :   Past Surgical History:  Procedure Laterality Date   APPENDECTOMY  11/27/1972   FRACTURE SURGERY  2006   IF L tib/fib   IR REMOVAL TUN ACCESS W/ PORT W/O FL MOD SED  03/20/2022   PERICARDIOCENTESIS N/A 10/03/2021   Procedure: PERICARDIOCENTESIS;  Surgeon: Dann Candyce RAMAN, MD;  Location: [facility] INVASIVE CV LAB;  Service: Cardiovascular;  Laterality: N/A;   PORTA CATH INSERTION N/A 06/06/2021   Procedure: PORTA CATH INSERTION;  Surgeon: Marea Selinda RAMAN, MD;  Location: [facility] INVASIVE CV LAB;  Service: Cardiovascular;  Laterality: N/A;   SHOULDER ARTHROSCOPY  10/23/2016  Procedure: ARTHROSCOPY SHOULDER WITH DISTAL CLAVICLE EXCISION, PARTIAL ACROMIONECTOMY, AND DEBRIDEMENT;  Surgeon: Kayla Pinal, MD;  Location: [facility] ORS;  Service: Orthopedics;;   TIBIA FRACTURE SURGERY Left 11/27/2002   titanium rod, ORIF   TRIGGER FINGER RELEASE Right 04/27/2023   rt ring finger/[facility]    FAMILY HISTORY :   Family History  Problem Relation Age of Onset   Heart disease Father    Osteoarthritis Father    Alzheimer's disease Father    Cancer Neg Hx    Diabetes Neg Hx    Hypertension Neg Hx    Stroke Neg Hx    COPD Neg Hx     SOCIAL HISTORY:   Social History   Tobacco Use   Smoking status: Every Day    Current packs/day: 0.50    Average packs/day: 0.5 packs/day for 45.0 years (22.5 ttl pk-yrs)    Types: Cigarettes   Smokeless tobacco: Never   Tobacco comments:    11/6-  currently smoking 5 cigarettes a day   Vaping Use   Vaping status: Never Used  Substance Use Topics   Alcohol use: Yes    Alcohol/week: 3.0 standard drinks of alcohol    Types: 3 Glasses of wine per week    Comment: a couple glasses of wine a week   Drug use: No    ALLERGIES:  is allergic to gluten meal.  MEDICATIONS:  Current Outpatient Medications  Medication Sig Dispense Refill   acalabrutinib  maleate (CALQUENCE ) 100 MG tablet Take 1 tablet (100mg ) by mouth daily in the morning and 1 tablet (100mg ) in evening on Mondays, Wednesdays, and Fridays. 40 tablet 2   cholecalciferol (VITAMIN D3) 25 MCG (1000 UNIT) tablet Take 1,000 Units by mouth daily.     Multiple Vitamins-Minerals (MULTIVITAMIN WITH MINERALS) tablet Take 1 tablet by mouth daily.     doxycycline  (ADOXA) 100 MG tablet Take 1 tablet (100 mg total) by mouth 2 (two) times daily. Take 100 mg twice daily for 14 days (Patient not taking: Reported on 12/17/2024) 20 tablet 2   Roflumilast  (ZORYVE ) 0.3 % CREA Apply 1 Application topically at bedtime. Qhs to psoriasis on legs and arms (Patient not taking: Reported on 12/17/2024) 60 g 6   No current facility-administered medications for this visit.    PHYSICAL EXAMINATION: ECOG PERFORMANCE STATUS: 0 - Asymptomatic  BP 117/69 (BP Location: Left Arm, Patient Position: Sitting)   Pulse 88   Temp 98 F (36.7 C) (Tympanic)   Resp 18   Ht 5' 6 (1.676 m)   Wt 158 lb 11.2 oz (72 kg)   SpO2 98%   BMI 25.61 kg/m   Filed Weights   12/17/24 1440  Weight: 158 lb 11.2 oz (72 kg)    No significant lymphadenopathy lymphadenopathy in the neck/underarm.  Physical Exam HENT:     Head: Normocephalic and atraumatic.     Mouth/Throat:     Pharynx: No oropharyngeal exudate.  Eyes:     Pupils: Pupils are equal, round, and reactive to light.  Cardiovascular:     Rate and Rhythm: Normal rate and regular rhythm.  Pulmonary:     Effort: No respiratory distress.     Breath sounds: No  wheezing.  Abdominal:     General: Bowel sounds are normal. There is no distension.     Palpations: Abdomen is soft. There is no mass.     Tenderness: There is no abdominal tenderness. There is no guarding or rebound.  Musculoskeletal:  General: No tenderness. Normal range of motion.     Cervical back: Normal range of motion and neck supple.  Skin:    General: Skin is warm.  Neurological:     Mental Status: He is alert and oriented to person, place, and time.  Psychiatric:        Mood and Affect: Affect normal.     LABORATORY DATA:  I have reviewed the data as listed    Component Value Date/Time   NA 141 12/17/2024 1417   NA 139 10/27/2021 0857   K 3.9 12/17/2024 1417   CL 105 12/17/2024 1417   CO2 26 12/17/2024 1417   GLUCOSE 70 12/17/2024 1417   BUN 8 12/17/2024 1417   BUN 16 10/27/2021 0857   CREATININE 0.84 12/17/2024 1417   CREATININE 0.84 10/06/2019 0000   CALCIUM  10.3 12/17/2024 1417   PROT 6.8 12/17/2024 1417   ALBUMIN 4.5 12/17/2024 1417   AST 22 12/17/2024 1417   ALT 23 12/17/2024 1417   ALKPHOS 93 12/17/2024 1417   BILITOT 0.5 12/17/2024 1417   GFRNONAA >60 12/17/2024 1417   GFRNONAA 91 10/06/2019 0000   GFRAA >60 12/26/2019 0842   GFRAA 106 10/06/2019 0000    No results found for: SPEP, UPEP  Lab Results  Component Value Date   WBC 10.5 12/17/2024   NEUTROABS 7.4 12/17/2024   HGB 17.1 (H) 12/17/2024   HCT 49.5 12/17/2024   MCV 91.8 12/17/2024   PLT 274 12/17/2024      Chemistry      Component Value Date/Time   NA 141 12/17/2024 1417   NA 139 10/27/2021 0857   K 3.9 12/17/2024 1417   CL 105 12/17/2024 1417   CO2 26 12/17/2024 1417   BUN 8 12/17/2024 1417   BUN 16 10/27/2021 0857   CREATININE 0.84 12/17/2024 1417   CREATININE 0.84 10/06/2019 0000      Component Value Date/Time   CALCIUM  10.3 12/17/2024 1417   ALKPHOS 93 12/17/2024 1417   AST 22 12/17/2024 1417   ALT 23 12/17/2024 1417   BILITOT 0.5 12/17/2024 1417        RADIOGRAPHIC STUDIES: I have personally reviewed the radiological images as listed and agreed with the findings in the report. No results found.   ASSESSMENT & PLAN:   .CLL (chronic lymphocytic leukemia) (HCC) # SLL/CLL-patient currently on acalabrutinib  100 mg twice a day [since start of May 2023]- tolerating well except mild-moderate-severe fatigue/insominia [see below]. Continue acalbrutinib, but SKIP the PM dose T/Thr/Sat & Sun.  # JUNE 2nd, 2025- CT CAP- No pathologically enlarged lymph nodes within the chest, abdomen or pelvis and no splenomegaly-  stable. Clinically, no evidence of any progression.  Labs within normal limits.  Continue surveillance will order CT CAP- given Hx of smoking.   # Psoriasis/? Arthritis-   on topical zorvye-  stable.   # Active smoker: recommend quitting smoking; cutting down-  stable.  # Enlarged prostate- PSA 7.0 [MAY 2024-]- s/p urology evaluation [Dr. Sninski/Dr. Wolf's office]- NOV 2024-PSA-5.3- improving- currently on surveillance- considering prostate MRI or biopsy if remains concerning. Recommend continued follow up Urology- stable.   # Secondary malignancies: dermatology [Dr.Stewart]- continue surveillance- stable; recommend colonoscopy- [never had one; prior cologard sec to prep]-finally agreed to colonoscopy today.   referral to [facility]-Dr.Anna- re: screening colonoscopy  # IV access: PIV  # Vaccines: ok with shingrix # [ 2023];Pneumonia shot [Aug 2025]  recommend -flu/ and Covid  # DISPOSITION: # referral to [facility]-Dr.Anna- re: screening  colonoscopy # follow up in 4 months- MD; labs- cbc/cmp; URIC ACID; LDH;mag; quantitative immunoglobulins; CT CAP;  Dr.B    Orders Placed This Encounter  Procedures   CT CHEST ABDOMEN PELVIS W CONTRAST    Standing Status:   Future    Expected Date:   04/16/2025    Expiration Date:   12/17/2025    If indicated for the ordered procedure, I authorize the administration of contrast media per Radiology  protocol:   Yes    Does the patient have a contrast media/X-ray dye allergy?:   No    Preferred imaging location?:   [facility]    If indicated for the ordered procedure, I authorize the administration of oral contrast media per Radiology protocol:   Yes   CBC with Differential (Cancer Center Only)    Standing Status:   Future    Expected Date:   04/16/2025    Expiration Date:   07/15/2025   CMP (Cancer Center only)    Standing Status:   Future    Expected Date:   04/16/2025    Expiration Date:   07/15/2025   Uric acid    Standing Status:   Future    Expected Date:   04/16/2025    Expiration Date:   07/15/2025   Lactate dehydrogenase    Standing Status:   Future    Expected Date:   04/16/2025    Expiration Date:   07/15/2025   Magnesium    Standing Status:   Future    Expected Date:   04/16/2025    Expiration Date:   07/15/2025   Immunoglobulins, QN, A/E/G/M    Standing Status:   Future    Expected Date:   04/16/2025    Expiration Date:   07/15/2025   Ambulatory referral to Gastroenterology    Referral Priority:   Routine    Referral Type:   Consultation    Referral Reason:   Specialty Services Required    Referred to Provider:   Therisa Bi, MD    Requested Specialty:   Gastroenterology    Number of Visits Requested:   1       Cindy JONELLE Joe, MD 12/17/2024 3:51 PM

## 2024-12-17 NOTE — Progress Notes (Signed)
 Over all doing well. Has intermittent diarrhea not requiring medications. Appetite is normal for him. No swelling of nodes, no fevers. Energy is up and down. Increased bruising since being on calquence .

## 2024-12-19 LAB — IMMUNOGLOBULINS A/E/G/M, SERUM
IgA: 82 mg/dL (ref 61–437)
IgE (Immunoglobulin E), Serum: 10 [IU]/mL (ref 6–495)
IgG (Immunoglobin G), Serum: 566 mg/dL — ABNORMAL LOW (ref 603–1613)
IgM (Immunoglobulin M), Srm: 13 mg/dL — ABNORMAL LOW (ref 15–143)

## 2025-01-01 ENCOUNTER — Other Ambulatory Visit: Payer: Self-pay

## 2025-01-01 ENCOUNTER — Other Ambulatory Visit (HOSPITAL_COMMUNITY): Payer: Self-pay

## 2025-01-01 ENCOUNTER — Other Ambulatory Visit: Payer: Self-pay | Admitting: Internal Medicine

## 2025-01-01 DIAGNOSIS — C9110 Chronic lymphocytic leukemia of B-cell type not having achieved remission: Secondary | ICD-10-CM

## 2025-01-01 MED ORDER — CALQUENCE 100 MG PO TABS
ORAL_TABLET | ORAL | 2 refills | Status: AC
  Filled 2025-01-01: qty 40, fill #0
  Filled 2025-01-02: qty 40, 28d supply, fill #0

## 2025-01-02 ENCOUNTER — Other Ambulatory Visit: Payer: Self-pay

## 2025-04-10 ENCOUNTER — Other Ambulatory Visit

## 2025-04-17 ENCOUNTER — Inpatient Hospital Stay

## 2025-04-17 ENCOUNTER — Inpatient Hospital Stay: Admitting: Internal Medicine

## 2025-08-17 ENCOUNTER — Ambulatory Visit
